# Patient Record
Sex: Male | Born: 1940 | Race: White | Hispanic: No | Marital: Married | Smoking: Never smoker
Health system: Southern US, Community
[De-identification: ages and names within clinical notes are randomized; demographics above are authoritative.]

## PROBLEM LIST (undated history)

## (undated) DIAGNOSIS — L739 Follicular disorder, unspecified: Secondary | ICD-10-CM

## (undated) DIAGNOSIS — K449 Diaphragmatic hernia without obstruction or gangrene: Secondary | ICD-10-CM

## (undated) DIAGNOSIS — L300 Nummular dermatitis: Secondary | ICD-10-CM

## (undated) DIAGNOSIS — M419 Scoliosis, unspecified: Secondary | ICD-10-CM

## (undated) DIAGNOSIS — E785 Hyperlipidemia, unspecified: Secondary | ICD-10-CM

## (undated) DIAGNOSIS — I10 Essential (primary) hypertension: Secondary | ICD-10-CM

## (undated) DIAGNOSIS — K259 Gastric ulcer, unspecified as acute or chronic, without hemorrhage or perforation: Secondary | ICD-10-CM

## (undated) DIAGNOSIS — I998 Other disorder of circulatory system: Secondary | ICD-10-CM

## (undated) DIAGNOSIS — C61 Malignant neoplasm of prostate: Secondary | ICD-10-CM

## (undated) DIAGNOSIS — R001 Bradycardia, unspecified: Secondary | ICD-10-CM

## (undated) DIAGNOSIS — N289 Disorder of kidney and ureter, unspecified: Secondary | ICD-10-CM

## (undated) DIAGNOSIS — K219 Gastro-esophageal reflux disease without esophagitis: Secondary | ICD-10-CM

## (undated) DIAGNOSIS — I509 Heart failure, unspecified: Secondary | ICD-10-CM

## (undated) DIAGNOSIS — I420 Dilated cardiomyopathy: Secondary | ICD-10-CM

## (undated) DIAGNOSIS — I2510 Atherosclerotic heart disease of native coronary artery without angina pectoris: Secondary | ICD-10-CM

## (undated) HISTORY — DX: Dilated cardiomyopathy: I42.0

## (undated) HISTORY — DX: Malignant neoplasm of prostate: C61

## (undated) HISTORY — DX: Gastric ulcer, unspecified as acute or chronic, without hemorrhage or perforation: K25.9

## (undated) HISTORY — PX: INGUINAL HERNIA REPAIR: SUR1180

---

## 2014-04-23 DIAGNOSIS — M12572 Traumatic arthropathy, left ankle and foot: Secondary | ICD-10-CM | POA: Diagnosis not present

## 2014-04-23 DIAGNOSIS — M25572 Pain in left ankle and joints of left foot: Secondary | ICD-10-CM | POA: Diagnosis not present

## 2015-07-05 DIAGNOSIS — H25811 Combined forms of age-related cataract, right eye: Secondary | ICD-10-CM | POA: Diagnosis not present

## 2015-07-05 DIAGNOSIS — H40013 Open angle with borderline findings, low risk, bilateral: Secondary | ICD-10-CM | POA: Diagnosis not present

## 2015-07-05 DIAGNOSIS — H25812 Combined forms of age-related cataract, left eye: Secondary | ICD-10-CM | POA: Diagnosis not present

## 2015-07-13 DIAGNOSIS — H2512 Age-related nuclear cataract, left eye: Secondary | ICD-10-CM | POA: Diagnosis not present

## 2015-07-29 DIAGNOSIS — H2511 Age-related nuclear cataract, right eye: Secondary | ICD-10-CM | POA: Diagnosis not present

## 2015-08-03 DIAGNOSIS — H2511 Age-related nuclear cataract, right eye: Secondary | ICD-10-CM | POA: Diagnosis not present

## 2015-12-19 ENCOUNTER — Ambulatory Visit (INDEPENDENT_AMBULATORY_CARE_PROVIDER_SITE_OTHER): Payer: Medicare Other | Admitting: Family Medicine

## 2015-12-19 ENCOUNTER — Other Ambulatory Visit: Payer: Self-pay

## 2015-12-19 ENCOUNTER — Emergency Department (HOSPITAL_COMMUNITY): Payer: Medicare Other

## 2015-12-19 ENCOUNTER — Inpatient Hospital Stay (HOSPITAL_COMMUNITY)
Admission: EM | Admit: 2015-12-19 | Discharge: 2015-12-22 | DRG: 308 | Disposition: A | Discharge status: Home / Self Care | Payer: Medicare Other | Attending: Internal Medicine | Admitting: Internal Medicine

## 2015-12-19 ENCOUNTER — Encounter: Payer: Self-pay | Admitting: Family Medicine

## 2015-12-19 ENCOUNTER — Encounter (HOSPITAL_COMMUNITY): Payer: Self-pay | Admitting: Physician Assistant

## 2015-12-19 VITALS — BP 144/80 | HR 144 | Temp 97.4°F | Resp 20

## 2015-12-19 DIAGNOSIS — E784 Other hyperlipidemia: Secondary | ICD-10-CM | POA: Diagnosis not present

## 2015-12-19 DIAGNOSIS — I5022 Chronic systolic (congestive) heart failure: Secondary | ICD-10-CM

## 2015-12-19 DIAGNOSIS — I10 Essential (primary) hypertension: Secondary | ICD-10-CM | POA: Diagnosis present

## 2015-12-19 DIAGNOSIS — M419 Scoliosis, unspecified: Secondary | ICD-10-CM | POA: Diagnosis present

## 2015-12-19 DIAGNOSIS — I4891 Unspecified atrial fibrillation: Secondary | ICD-10-CM | POA: Diagnosis not present

## 2015-12-19 DIAGNOSIS — R748 Abnormal levels of other serum enzymes: Secondary | ICD-10-CM

## 2015-12-19 DIAGNOSIS — I420 Dilated cardiomyopathy: Secondary | ICD-10-CM | POA: Diagnosis not present

## 2015-12-19 DIAGNOSIS — I272 Pulmonary hypertension, unspecified: Secondary | ICD-10-CM

## 2015-12-19 DIAGNOSIS — I447 Left bundle-branch block, unspecified: Secondary | ICD-10-CM | POA: Diagnosis present

## 2015-12-19 DIAGNOSIS — E785 Hyperlipidemia, unspecified: Secondary | ICD-10-CM | POA: Diagnosis present

## 2015-12-19 DIAGNOSIS — K219 Gastro-esophageal reflux disease without esophagitis: Secondary | ICD-10-CM | POA: Diagnosis present

## 2015-12-19 DIAGNOSIS — Z7982 Long term (current) use of aspirin: Secondary | ICD-10-CM

## 2015-12-19 DIAGNOSIS — E7849 Other hyperlipidemia: Secondary | ICD-10-CM | POA: Insufficient documentation

## 2015-12-19 DIAGNOSIS — I509 Heart failure, unspecified: Secondary | ICD-10-CM

## 2015-12-19 DIAGNOSIS — F0280 Dementia in other diseases classified elsewhere without behavioral disturbance: Secondary | ICD-10-CM | POA: Diagnosis present

## 2015-12-19 DIAGNOSIS — R739 Hyperglycemia, unspecified: Secondary | ICD-10-CM | POA: Diagnosis present

## 2015-12-19 DIAGNOSIS — G309 Alzheimer's disease, unspecified: Secondary | ICD-10-CM | POA: Diagnosis present

## 2015-12-19 DIAGNOSIS — R1310 Dysphagia, unspecified: Secondary | ICD-10-CM | POA: Diagnosis present

## 2015-12-19 DIAGNOSIS — I5021 Acute systolic (congestive) heart failure: Secondary | ICD-10-CM | POA: Diagnosis not present

## 2015-12-19 DIAGNOSIS — I480 Paroxysmal atrial fibrillation: Secondary | ICD-10-CM | POA: Diagnosis present

## 2015-12-19 DIAGNOSIS — I5043 Acute on chronic combined systolic (congestive) and diastolic (congestive) heart failure: Secondary | ICD-10-CM | POA: Diagnosis present

## 2015-12-19 DIAGNOSIS — Z79899 Other long term (current) drug therapy: Secondary | ICD-10-CM | POA: Diagnosis not present

## 2015-12-19 DIAGNOSIS — R7989 Other specified abnormal findings of blood chemistry: Secondary | ICD-10-CM

## 2015-12-19 DIAGNOSIS — E876 Hypokalemia: Secondary | ICD-10-CM | POA: Diagnosis present

## 2015-12-19 DIAGNOSIS — R0602 Shortness of breath: Secondary | ICD-10-CM | POA: Diagnosis not present

## 2015-12-19 DIAGNOSIS — R001 Bradycardia, unspecified: Secondary | ICD-10-CM | POA: Diagnosis present

## 2015-12-19 DIAGNOSIS — I110 Hypertensive heart disease with heart failure: Secondary | ICD-10-CM | POA: Diagnosis present

## 2015-12-19 DIAGNOSIS — Z8711 Personal history of peptic ulcer disease: Secondary | ICD-10-CM | POA: Diagnosis not present

## 2015-12-19 DIAGNOSIS — R778 Other specified abnormalities of plasma proteins: Secondary | ICD-10-CM | POA: Insufficient documentation

## 2015-12-19 DIAGNOSIS — I200 Unstable angina: Secondary | ICD-10-CM | POA: Diagnosis not present

## 2015-12-19 DIAGNOSIS — Z8249 Family history of ischemic heart disease and other diseases of the circulatory system: Secondary | ICD-10-CM

## 2015-12-19 DIAGNOSIS — I5031 Acute diastolic (congestive) heart failure: Secondary | ICD-10-CM

## 2015-12-19 HISTORY — DX: Follicular disorder, unspecified: L73.9

## 2015-12-19 HISTORY — DX: Hyperlipidemia, unspecified: E78.5

## 2015-12-19 HISTORY — DX: Nummular dermatitis: L30.0

## 2015-12-19 HISTORY — DX: Essential (primary) hypertension: I10

## 2015-12-19 HISTORY — DX: Gastro-esophageal reflux disease without esophagitis: K21.9

## 2015-12-19 HISTORY — DX: Gastric ulcer, unspecified as acute or chronic, without hemorrhage or perforation: K25.9

## 2015-12-19 HISTORY — DX: Scoliosis, unspecified: M41.9

## 2015-12-19 HISTORY — DX: Other disorder of circulatory system: I99.8

## 2015-12-19 HISTORY — DX: Bradycardia, unspecified: R00.1

## 2015-12-19 HISTORY — DX: Diaphragmatic hernia without obstruction or gangrene: K44.9

## 2015-12-19 LAB — BASIC METABOLIC PANEL
Anion gap: 10 (ref 5–15)
BUN: 21 mg/dL — ABNORMAL HIGH (ref 6–20)
CO2: 23 mmol/L (ref 22–32)
Calcium: 9.7 mg/dL (ref 8.9–10.3)
Chloride: 110 mmol/L (ref 101–111)
Creatinine, Ser: 1.07 mg/dL (ref 0.61–1.24)
GFR calc Af Amer: >60 mL/min (ref >60)
GFR calc non Af Amer: >60 mL/min (ref >60)
Glucose, Bld: 110 mg/dL — ABNORMAL HIGH (ref 65–99)
Potassium: 4.4 mmol/L (ref 3.5–5.1)
Sodium: 143 mmol/L (ref 135–145)

## 2015-12-19 LAB — TROPONIN I
TROPONIN I: 0.03 ng/mL — AB (ref <0.03)
TROPONIN I: 0.04 ng/mL — AB (ref <0.03)

## 2015-12-19 LAB — BRAIN NATRIURETIC PEPTIDE: B Natriuretic Peptide: 362.3 pg/mL — ABNORMAL HIGH (ref 0.0–100.0)

## 2015-12-19 LAB — CBC
HCT: 43.9 % (ref 39.0–52.0)
Hemoglobin: 14.6 g/dL (ref 13.0–17.0)
MCH: 30.1 pg (ref 26.0–34.0)
MCHC: 33.3 g/dL (ref 30.0–36.0)
MCV: 90.5 fL (ref 78.0–100.0)
Platelets: 234 10*3/uL (ref 150–400)
RBC: 4.85 MIL/uL (ref 4.22–5.81)
RDW: 14.2 % (ref 11.5–15.5)
WBC: 7.5 10*3/uL (ref 4.0–10.5)

## 2015-12-19 LAB — HEPATIC FUNCTION PANEL
ALBUMIN: 3.8 g/dL (ref 3.5–5.0)
ALT: 55 U/L (ref 17–63)
AST: 35 U/L (ref 15–41)
Alkaline Phosphatase: 107 U/L (ref 38–126)
Bilirubin, Direct: 0.4 mg/dL (ref 0.1–0.5)
Indirect Bilirubin: 1.6 mg/dL — ABNORMAL HIGH (ref 0.3–0.9)
TOTAL PROTEIN: 6.4 g/dL — AB (ref 6.5–8.1)
Total Bilirubin: 2 mg/dL — ABNORMAL HIGH (ref 0.3–1.2)

## 2015-12-19 LAB — I-STAT TROPONIN, ED: Troponin i, poc: 0.04 ng/mL (ref 0.00–0.08)

## 2015-12-19 LAB — MRSA PCR SCREENING: MRSA BY PCR: NEGATIVE

## 2015-12-19 LAB — TSH: TSH: 0.885 u[IU]/mL (ref 0.350–4.500)

## 2015-12-19 MED ORDER — ONDANSETRON HCL 4 MG PO TABS: 4.0000 mg | ORAL_TABLET | Freq: Four times a day (QID) | ORAL | Status: DC | PRN

## 2015-12-19 MED ORDER — FUROSEMIDE 10 MG/ML IJ SOLN
40.0000 mg | Freq: Once | INTRAMUSCULAR | Status: AC
  Administered 2015-12-19: 40 mg via INTRAVENOUS
  Filled 2015-12-19: qty 4

## 2015-12-19 MED ORDER — HEPARIN BOLUS VIA INFUSION
4000.0000 [IU] | Freq: Once | INTRAVENOUS | Status: AC
  Administered 2015-12-19: 4000 [IU] via INTRAVENOUS
  Filled 2015-12-19: qty 4000

## 2015-12-19 MED ORDER — ACETAMINOPHEN 650 MG RE SUPP: 650.0000 mg | Freq: Four times a day (QID) | RECTAL | Status: DC | PRN

## 2015-12-19 MED ORDER — DILTIAZEM HCL-DEXTROSE 100-5 MG/100ML-% IV SOLN (PREMIX)
5.0000 mg/h | INTRAVENOUS | Status: AC
  Administered 2015-12-19 – 2015-12-20 (×2): 10 mg/h via INTRAVENOUS
  Filled 2015-12-19: qty 100

## 2015-12-19 MED ORDER — POLYETHYLENE GLYCOL 3350 17 G PO PACK: 17.0000 g | PACK | Freq: Every day | ORAL | Status: DC | PRN

## 2015-12-19 MED ORDER — SENNOSIDES-DOCUSATE SODIUM 8.6-50 MG PO TABS
2.0000 | ORAL_TABLET | Freq: Every evening | ORAL | Status: DC | PRN
  Administered 2015-12-19: 2 via ORAL
  Filled 2015-12-19: qty 2

## 2015-12-19 MED ORDER — HEPARIN (PORCINE) IN NACL 100-0.45 UNIT/ML-% IJ SOLN
1200.0000 [IU]/h | INTRAMUSCULAR | Status: AC
  Administered 2015-12-19 – 2015-12-20 (×2): 1200 [IU]/h via INTRAVENOUS
  Filled 2015-12-19 (×2): qty 250

## 2015-12-19 MED ORDER — DILTIAZEM LOAD VIA INFUSION
20.0000 mg | Freq: Once | INTRAVENOUS | Status: AC
  Administered 2015-12-19: 20 mg via INTRAVENOUS
  Filled 2015-12-19: qty 20

## 2015-12-19 MED ORDER — ACETAMINOPHEN 325 MG PO TABS
650.0000 mg | ORAL_TABLET | Freq: Four times a day (QID) | ORAL | Status: DC | PRN
Start: 2015-12-19 — End: 2015-12-22

## 2015-12-19 MED ORDER — ONDANSETRON HCL 4 MG/2ML IJ SOLN: 4.0000 mg | Freq: Four times a day (QID) | INTRAMUSCULAR | Status: DC | PRN

## 2015-12-19 MED ORDER — ASPIRIN 81 MG PO CHEW
81.0000 mg | CHEWABLE_TABLET | Freq: Every day | ORAL | Status: DC
Start: 2015-12-19 — End: 2015-12-22
  Administered 2015-12-19 – 2015-12-22 (×4): 81 mg via ORAL
  Filled 2015-12-19 (×4): qty 1

## 2015-12-19 MED ORDER — DILTIAZEM HCL-DEXTROSE 100-5 MG/100ML-% IV SOLN (PREMIX)
5.0000 mg/h | INTRAVENOUS | Status: DC
  Administered 2015-12-19: 5 mg/h via INTRAVENOUS
  Filled 2015-12-19 (×2): qty 100

## 2015-12-19 MED ORDER — ASPIRIN 81 MG PO CHEW
324.0000 mg | CHEWABLE_TABLET | Freq: Once | ORAL | Status: AC
  Administered 2015-12-19: 324 mg via ORAL

## 2015-12-19 NOTE — H&P (Signed)
History and Physical    Stephen Copeland OEV:[id number] DOB: 07/19/1940 DOA: 12/19/2015  PCP: No primary care provider on file. [place] Patient coming from: home  Chief Complaint: sob  HPI: Stephen Copeland is a 75 y.o. male with medical history significant for duodenal ulcer, hypertension, hyperlipidemia, GERD presents to the emergency Department chief complaint gradual worsening shortness of breath. Initial evaluation reveals A. fib with RVR and hypertension.  Information is obtained from the patient. He reports a persistent shortness of breath since 1990. He said it began to get worse about a month ago and in the last 3-4 days he developed a cough generalized weakness. He reports one episode of  chest pain several days ago but states it did not last even a minute. Describes pain as sudden ache left anterior non-radiating. No diaphoresis.  He states he was regurgitating his medication so about a week ago he had a barium swallow. Since that time he just "hasn't felt right". He says he's been having difficulty swallowing and experiences intermittent nausea. He denies headache visual disturbances syncope or near-syncope. He denies abdominal pain. He reports some LE edema and orthopnea.  He also reports history of sinus bradycardia and anterior septals ischemia. He states he's had several stress test in the past. He went to [facility] today where rapid AF 140's noted. [facility]    ED Course: Emergency department is afebrile hypertensive A. fib with RVR not hypoxic  Review of Systems: As per HPI otherwise 10 point review of systems negative.   Ambulatory Status: Ambulates independently with a steady gait no recent falls  Past Medical History:  Diagnosis Date  . Acid reflux   . Folliculitis   . Hiatal hernia   . Hyperlipidemia    a. pt is adamantly against statins.  . Hypertension   . Ischemia    a. Pt states he was diagnosed with "ischemia" in the 1990s but does not know further  details, denies hx of heart blockage.  . Nummular dermatitis   . Scoliosis   . Sinus bradycardia   . Stomach ulcer    a. remote ulcer in the 1990s, no hx of bleeding.    No past surgical history on file.  Social History   Social History  . Marital status: Married    Spouse name: N/A  . Number of children: N/A  . Years of education: N/A   Occupational History  . Not on file.   Social History Main Topics  . Smoking status: Never Smoker  . Smokeless tobacco: Not on file  . Alcohol use No  . Drug use: No  . Sexual activity: Not on file   Other Topics Concern  . Not on file   Social History Narrative  . No narrative on file    No Known Allergies  Family History  Problem Relation Age of Onset  . Heart disease Mother     Further details not reported, died at age 46  . Valvular heart disease Father     H/o MV surgery, diet at age 48    Prior to Admission medications   Medication Sig Start Date End Date Taking? Authorizing Provider  aspirin 81 MG chewable tablet Chew 81 mg by mouth daily.   Yes Historical Provider, MD  ENSURE PLUS (ENSURE PLUS) LIQD Take 237 mLs by mouth daily.   Yes Historical Provider, MD  guaiFENesin-dextromethorphan (ROBITUSSIN DM) 100-10 MG/5ML syrup Take 5 mLs by mouth every 4 (four) hours as needed for  cough.   Yes Historical Provider, MD  verapamil (CALAN-SR) 180 MG CR tablet Take 180 mg by mouth 2 (two) times daily.   Yes Historical Provider, MD    Physical Exam: Vitals:   12/19/15 1433 12/19/15 1445 12/19/15 1500 12/19/15 1533  BP: (!) 146/112 151/94 137/96 (!) 155/114  Pulse: 101 89 76 103  Resp: 19 20  23   Temp:      TempSrc:      SpO2: 93% 93% 92% 94%     General:  Appears calm and comfortable No acute distress Eyes:  PERRL, EOMI, normal lids, iris ENT:  grossly normal hearing, lips & tongue, mmm Neck:  no LAD, masses or thyromegaly Cardiovascular:  Irregularly irregular no m/r/g. 1+ pitting edema on right leg.  Respiratory:   Rest sounds somewhat distant hear fine crackles faint rhonchi, no w/r/r. Normal respiratory effort. Abdomen:  soft, ntnd, NABS Skin:  no rash or induration seen on limited exam Musculoskeletal:  grossly normal tone BUE/BLE, good ROM, no bony abnormality Psychiatric:  grossly normal mood and affect, speech fluent and appropriate, AOx3 Neurologic:  CN 2-12 grossly intact, moves all extremities in coordinated fashion, sensation intact  Labs on Admission: I have personally reviewed following labs and imaging studies  CBC:  Recent Labs Lab 12/19/15 1144  WBC 7.5  HGB 14.6  HCT 43.9  MCV 90.5  PLT 323   Basic Metabolic Panel:  Recent Labs Lab 12/19/15 1144  NA 143  K 4.4  CL 110  CO2 23  GLUCOSE 110*  BUN 21*  CREATININE 1.07  CALCIUM 9.7   GFR: CrCl cannot be calculated (Unknown ideal weight.). Liver Function Tests: No results for input(s): AST, ALT, ALKPHOS, BILITOT, PROT, ALBUMIN in the last 168 hours. No results for input(s): LIPASE, AMYLASE in the last 168 hours. No results for input(s): AMMONIA in the last 168 hours. Coagulation Profile: No results for input(s): INR, PROTIME in the last 168 hours. Cardiac Enzymes: No results for input(s): CKTOTAL, CKMB, CKMBINDEX, TROPONINI in the last 168 hours. BNP (last 3 results) No results for input(s): PROBNP in the last 8760 hours. HbA1C: No results for input(s): HGBA1C in the last 72 hours. CBG: No results for input(s): GLUCAP in the last 168 hours. Lipid Profile: No results for input(s): CHOL, HDL, LDLCALC, TRIG, CHOLHDL, LDLDIRECT in the last 72 hours. Thyroid Function Tests: No results for input(s): TSH, T4TOTAL, FREET4, T3FREE, THYROIDAB in the last 72 hours. Anemia Panel: No results for input(s): VITAMINB12, FOLATE, FERRITIN, TIBC, IRON, RETICCTPCT in the last 72 hours. Urine analysis: No results found for: COLORURINE, APPEARANCEUR, LABSPEC, PHURINE, GLUCOSEU, HGBUR, BILIRUBINUR, KETONESUR, PROTEINUR,  UROBILINOGEN, NITRITE, LEUKOCYTESUR  Creatinine Clearance: CrCl cannot be calculated (Unknown ideal weight.).  Sepsis Labs: @LABRCNTIP (procalcitonin:4,lacticidven:4) )No results found for this or any previous visit (from the past 240 hour(s)).   Radiological Exams on Admission: Dg Chest 2 View  Result Date: 12/19/2015 CLINICAL DATA:  Patient with chronic shortness of breath. EXAM: CHEST  2 VIEW COMPARISON:  None. FINDINGS: Cardiac contours are enlarged. Tortuosity of the thoracic aorta. Bilateral mid and lower lung heterogeneous pulmonary opacities. Pulmonary vascular redistribution. Perihilar interstitial opacities. Small bilateral pleural effusions. Thoracic spine degenerative changes. IMPRESSION: Cardiomegaly, pulmonary vascular redistribution and mild bilateral perihilar interstitial opacities favored to represent mild edema. Basilar heterogeneous opacities may represent atelectasis, scarring or infection. Electronically Signed   By: Lovey Newcomer M.D.   On: 12/19/2015 12:37    EKG: Independently reviewed. Atrial fibrillation Ventricular premature complex Left bundle branch block  No previous tracing  Assessment/Plan Principal Problem:   New onset a-fib [facility]) Active Problems:   Atrial fibrillation (HCC)   Hypertension   Acid reflux   Hyperlipidemia   #1. New onset A. Fib. Chadvasc score 3. History of hypertension ischemia. EKG as noted above. Patient provided with diltiazem drip in the emergency department and rate became controlled. Evaluated by cardiology who recommended anticoagulation -Admit to telemetry -heparin per pharmacy per cards -Continue diltiazem drip -Wean diltiazem drip as able -Obtain a TSH -Obtain an echo per cardiology recommendation -Appreciate cardiology assistance  #2. Hypertension. Fair control in the emergency department. -Currently on a diltiazem drip -Track blood pressure as drip weaned  #3. Hyperlipidemia. No chest pain.. Not currently on a  statin -lipid panel  4. Lower extremity edema. He reports history of ischemia but denies any heart failure. Chest xray as noted above. BNP 363, Evaluated by cardiology who opined mild acute CHF. -Echo -Cycle troponin -IV Lasix -Daily weights -Monitor intake and output     DVT prophylaxis: hepain  Code Status: full  Family Communication: none  Disposition Plan: home when ready  Consults called: Sharrell Ku PA cardiology  Admission status: inpatient    Radene Gunning MD [facility]  If 7PM-7AM, please contact night-coverage [email or web address] Password [email or web address]  12/19/2015, 3:42 PM

## 2015-12-19 NOTE — Consult Note (Signed)
Cardiology Consultation Note    Patient ID: Stephen Copeland, MRN: [id number], DOB/AGE: Jul 11, 1940 75 y.o. Admit date: 12/19/2015   Date of Consult: 12/19/2015 Primary Physician: No primary care provider on file. Primary Cardiologist: New to Dr. [name] - receives care at [facility]  Chief Complaint: Shortness of breath, orthopnea Reason for Consultation: Newly recognized atrial fib RVR Requesting MD: Dr. Marily Memos  HPI: Stephen Copeland is a 75 y.o. male with history of HTN, hyperlipidemia (adamant against taking statin), nummular dermatitis, sinus bradycardia, remote stomach ulcer without h/o significant bleeding, hiatal hernia, GERD, chronic edema who presented to [facility] upon transfer from urgent care with newly recognized atrial fib. He gets all his care through the [place]. He reports bring told he had "ischemia" and "sinus bradycardia" in the past, but does not know further details of how the "ischemia" was diagnosed - this was in the 1990s and he says he's subsequently had stress tests that were OK. Denies h/o CAD, PCI or cardiac surgery. For the past 3-4 days he's had worsening SOB, DOE, and orthopnea along with presyncope. He "just hasn't felt right." He's had SOB for quite some time but it's been really pronounced over the last few days. On one occasion he felt chest discomfort described as a ring around the heart that came and went in a few seconds, not precipitated by anything. He also also felt his heart pounding and body shaking. He had to prop up on pillows/recliner last night to sleep. He reports chronic BLE, unchanged recently. He went to urgent care today where he was noted to be in rapid AF 140s with LBBB of unknown duration. He was transported to [facility] and received diltiazem 20mg  IV with drip and subsequent improvement in HR to the 90s. POC trop neg, BNP 363, BUN 21, Cr 1.07, K 4.4, Hgb 14.6, glucose 110. CXR shows cardiomegaly, pulmonary vascular redistribution and mild bilateral perihilar interstitial  opacities favored to represent mild edema with basilar heterogeneous opacities possibly to represent atelectasis, scarring or infection. He is hypertensive up to 172/132 in the ED. He likes to learn about medical conditions including the dangers of statins and said part of how he learns is going to [facility] and asking the surgeons that come in there questions.   Past Medical History:  Diagnosis Date  . Acid reflux   . Folliculitis   . Hiatal hernia   . Hyperlipidemia    a. pt is adamantly against statins.  . Hypertension   . Ischemia    a. Pt states he was diagnosed with "ischemia" in the 1990s but does not know further details, denies hx of heart blockage.  . Nummular dermatitis   . Scoliosis   . Sinus bradycardia   . Stomach ulcer    a. remote ulcer in the 1990s, no hx of bleeding.      Surgical History: No past surgical history on file.   Home Meds: Prior to Admission medications   Medication Sig Start Date End Date Taking? Authorizing Provider  aspirin 81 MG chewable tablet Chew 81 mg by mouth daily.   Yes Historical Provider, MD  ENSURE PLUS (ENSURE PLUS) LIQD Take 237 mLs by mouth daily.   Yes Historical Provider, MD  guaiFENesin-dextromethorphan (ROBITUSSIN DM) 100-10 MG/5ML syrup Take 5 mLs by mouth every 4 (four) hours as needed for cough.   Yes Historical Provider, MD  verapamil (CALAN-SR) 180 MG CR tablet Take 180 mg by mouth 2 (two) times daily.   Yes Historical Provider, MD  Inpatient Medications:  . aspirin  81 mg Oral Daily   . diltiazem (CARDIZEM) infusion 7.5 mg/hr (12/19/15 1416)  . diltiazem (CARDIZEM) infusion Stopped (12/19/15 1505)    Allergies: No Known Allergies  Social History   Social History  . Marital status: Married    Spouse name: N/A  . Number of children: N/A  . Years of education: N/A   Occupational History  . Not on file.   Social History Main Topics  . Smoking status: Never Smoker  . Smokeless tobacco: Not on file  .  Alcohol use No  . Drug use: No  . Sexual activity: Not on file   Other Topics Concern  . Not on file   Social History Narrative  . No narrative on file     Family History  Problem Relation Age of Onset  . Heart disease Mother     Further details not reported, died at age 46  . Valvular heart disease Father     H/o MV surgery, diet at age 50     Review of Systems: occ nausea. All other systems reviewed and are otherwise negative except as noted above.  Labs: No results for input(s): CKTOTAL, CKMB, TROPONINI in the last 72 hours. Lab Results  Component Value Date   WBC 7.5 12/19/2015   HGB 14.6 12/19/2015   HCT 43.9 12/19/2015   MCV 90.5 12/19/2015   PLT 234 12/19/2015    Recent Labs Lab 12/19/15 1144  NA 143  K 4.4  CL 110  CO2 23  BUN 21*  CREATININE 1.07  CALCIUM 9.7  GLUCOSE 110*   No results found for: CHOL, HDL, LDLCALC, TRIG No results found for: DDIMER  Radiology/Studies:  Dg Chest 2 View  Result Date: 12/19/2015 CLINICAL DATA:  Patient with chronic shortness of breath. EXAM: CHEST  2 VIEW COMPARISON:  None. FINDINGS: Cardiac contours are enlarged. Tortuosity of the thoracic aorta. Bilateral mid and lower lung heterogeneous pulmonary opacities. Pulmonary vascular redistribution. Perihilar interstitial opacities. Small bilateral pleural effusions. Thoracic spine degenerative changes. IMPRESSION: Cardiomegaly, pulmonary vascular redistribution and mild bilateral perihilar interstitial opacities favored to represent mild edema. Basilar heterogeneous opacities may represent atelectasis, scarring or infection. Electronically Signed   By: Lovey Newcomer M.D.   On: 12/19/2015 12:37    Wt Readings from Last 3 Encounters:  No data found for Wt    EKG: atrial fib 133bpm, LBBB, occ PVCs, nonspecific ST-T changes without prior EKG to compare to  Physical Exam: Blood pressure (!) 155/114, pulse 103, temperature 97.7 F (36.5 C), temperature source Oral, resp. rate  23, SpO2 94 %. There is no height or weight on file to calculate BMI. General: Well developed, well nourished WM, in no acute distress. Head: Normocephalic, atraumatic, sclera non-icteric, no xanthomas, nares are without discharge. Neck: Negative for carotid bruits. JVD not elevated. Lungs: Basilar crackles and coarse rhonchi. No rales or rhonchi. Breathing is unlabored. Heart: Irregularly irregular, borderline tachycardic. No murmurs, rubs, or gallops appreciated. Abdomen: Soft, non-tender, non-distended with normoactive bowel sounds. No hepatomegaly. No rebound/guarding. No obvious abdominal masses. Msk:  Strength and tone appear normal for age. Extremities: No clubbing or cyanosis. Tr-1+ BLE pitting edema.  Distal pedal pulses are 2+ and equal bilaterally. Neuro: Alert and oriented X 3. No facial asymmetry. No focal deficit. Moves all extremities spontaneously. Psych:  Responds to questions appropriately with a normal affect.     Assessment and Plan  41M with HTN, hyperlipidemia (adamant against taking statin), nummular dermatitis, sinus  bradycardia, remote stomach ulcer without h/o significant bleeding, hiatal hernia, GERD, chronic edema who presented to [facility] with newly recognized atrial fib.  1. Newly recognized atrial fib - thyroid function pending. CHADSVASC is at least 2 for age and HTN (LVEF and A1C not yet known), warranting anticoagulation to reduce risk of stroke. Risks and benefits of blood thinners reviewed with patient. Will start heparin per pharmacy. If he rules out and no further invasive procedures planned, would transition to Eliquis tomorrow. Monitor rhythm on IV diltiazem. If he remains in afib, will need to reassess whether we need to pursue TEE/DCCV as inpatient versus DC home with 3 weeks of anticoagulation followed by cardioversion. Will order 2D echo to evaluate LV function.  2. Suspected mild acute CHF, unknown EF - check echocardiogram. Will also trend troponins. Will  give one dose of IV Lasix in ED and follow volume status.  3. HTN - follow BP as rate-controlling med is titrated.  4. Hyperlipidemia - f/u lipid panel in AM along with baseline LFTs.  5. Hyperglycemia - obtain A1C.  Signed, Stephen Pitter PA-C 12/19/2015, 3:37 PM Pager: [phone]  I have personally seen and examined this patient with Stephen Copa, PA-C. I agree with the assessment and plan as outlined above. He is being admitted with new onset atrial fib. He is followed at the [facility] and has not been told in the past that he had atrial fib. He has had several days of worsened dyspnea, orthopnea and LE edema. He was found to have atrial fib with RVR. My exam shows a well developed male in NAD. [id number], irregular. Lungs: rhonci bilaterally with basilar crackles, Ext: 1+ bilateral LE edema. Labs reviewed by me. EKG reviewed by me shows atrial fib with rate 130s bpm, LBBB, Non-specific ST changes.  Will admit to tele. IV heparin tonight. IV Cardizem for rate control. Echo tomorrow. Cycle troponin. Lasix for volume overload. If we can rate control on Cardizem, will change to oral Cardizem tomorrow. Will consider NOAC tomorrow if no invasive procedures are planned.   Stephen Copeland 12/19/2015 4:24 PM

## 2015-12-19 NOTE — ED Notes (Signed)
MD at bedside. 

## 2015-12-19 NOTE — ED Provider Notes (Signed)
[facility] DEPT Provider Note   CSN: [id number] Arrival date & time: 12/19/15  1109     History   Chief Complaint Chief Complaint  Patient presents with  . Atrial Fibrillation  . Shortness of Breath    HPI Stephen Copeland is a 75 y.o. male who presents with shortness of breath that began 1 month ago and has been worsening in the past 3-4 days. Patient states that his shortness of breath is worse on exertion. He denies any chest pain. Patient is on verapamil for hypertension, however he has not been taking in very much for the past month because he is having difficulty with regurgitation. Patient is seeing GI through the [place]. He recently had a barium swallow for workup of this issue. Patient has history of sinus bradycardia and anteroseptal ischemia in the early 90s, however no other cardiac history. Patient denies any abdominal pain, nausea, vomiting, urinary symptoms.  HPI  No past medical history on file.  Patient Active Problem List   Diagnosis Date Noted  . Atrial fibrillation ([place]) 12/19/2015  . Hypertension 12/19/2015  . New onset a-fib ([place]) 12/19/2015    No past surgical history on file.     Home Medications    Prior to Admission medications   Medication Sig Start Date End Date Taking? Authorizing Provider  aspirin 81 MG chewable tablet Chew 81 mg by mouth daily.   Yes Historical Provider, MD  ENSURE PLUS (ENSURE PLUS) LIQD Take 237 mLs by mouth daily.   Yes Historical Provider, MD  guaiFENesin-dextromethorphan (ROBITUSSIN DM) 100-10 MG/5ML syrup Take 5 mLs by mouth every 4 (four) hours as needed for cough.   Yes Historical Provider, MD  verapamil (CALAN-SR) 180 MG CR tablet Take 180 mg by mouth 2 (two) times daily.   Yes Historical Provider, MD    Family History No family history on file.  Social History Social History  Substance Use Topics  . Smoking status: Unknown If Ever Smoked  . Smokeless tobacco: Not on file  . Alcohol use Not on file      Allergies   Review of patient's allergies indicates no known allergies.   Review of Systems Review of Systems  Constitutional: Negative for chills and fever.  HENT: Negative for facial swelling and sore throat.   Respiratory: Positive for shortness of breath.   Cardiovascular: Positive for leg swelling. Negative for chest pain.  Gastrointestinal: Negative for abdominal pain, nausea and vomiting.  Genitourinary: Negative for dysuria.  Musculoskeletal: Negative for back pain.  Skin: Negative for rash and wound.  Neurological: Negative for headaches.  Psychiatric/Behavioral: The patient is not nervous/anxious.      Physical Exam Updated Vital Signs BP 137/96   Pulse 76   Temp 97.7 F (36.5 C) (Oral)   Resp 20   SpO2 92%   Physical Exam  Constitutional: He appears well-developed and well-nourished. No distress.  HENT:  Head: Normocephalic and atraumatic.  Mouth/Throat: Oropharynx is clear and moist. No oropharyngeal exudate.  Eyes: Conjunctivae are normal. Pupils are equal, round, and reactive to light. Right eye exhibits no discharge. Left eye exhibits no discharge. No scleral icterus.  Neck: Normal range of motion. Neck supple. No thyromegaly present.  Cardiovascular: Normal heart sounds and intact distal pulses.  An irregularly irregular rhythm present. Tachycardia present.  Exam reveals no gallop and no friction rub.   No murmur heard. Pulmonary/Chest: Effort normal and breath sounds normal. No stridor. No respiratory distress. He has no wheezes. He has no rales.  Abdominal: Soft. Bowel sounds are normal. He exhibits no distension. There is no tenderness. There is no rebound and no guarding.  Musculoskeletal: He exhibits no edema.  Lymphadenopathy:    He has no cervical adenopathy.  Neurological: He is alert. Coordination normal.  Skin: Skin is warm and dry. No rash noted. He is not diaphoretic. No pallor.  Psychiatric: He has a normal mood and affect.  Nursing  note and vitals reviewed.    ED Treatments / Results  Labs (all labs ordered are listed, but only abnormal results are displayed) Labs Reviewed  BASIC METABOLIC PANEL - Abnormal; Notable for the following:       Result Value   Glucose, Bld 110 (*)    BUN 21 (*)    All other components within normal limits  BRAIN NATRIURETIC PEPTIDE - Abnormal; Notable for the following:    B Natriuretic Peptide 362.3 (*)    All other components within normal limits  CBC  TSH  I-STAT TROPOININ, ED    EKG  EKG Interpretation  Date/Time:  Monday December 19 2015 11:04:56 EDT Ventricular Rate:  133 PR Interval:    QRS Duration: 139 QT Interval:  330 QTC Calculation: 491 R Axis:   -33 Text Interpretation:  Atrial fibrillation Ventricular premature complex Left bundle branch block No previous tracing Confirmed by Ashok Cordia  MD, Lennette Bihari ([id number]) on 12/19/2015 2:34:58 PM       Radiology Dg Chest 2 View  Result Date: 12/19/2015 CLINICAL DATA:  Patient with chronic shortness of breath. EXAM: CHEST  2 VIEW COMPARISON:  None. FINDINGS: Cardiac contours are enlarged. Tortuosity of the thoracic aorta. Bilateral mid and lower lung heterogeneous pulmonary opacities. Pulmonary vascular redistribution. Perihilar interstitial opacities. Small bilateral pleural effusions. Thoracic spine degenerative changes. IMPRESSION: Cardiomegaly, pulmonary vascular redistribution and mild bilateral perihilar interstitial opacities favored to represent mild edema. Basilar heterogeneous opacities may represent atelectasis, scarring or infection. Electronically Signed   By: Lovey Newcomer M.D.   On: 12/19/2015 12:37    Procedures Procedures (including critical care time)  Medications Ordered in ED Medications  diltiazem (CARDIZEM) 1 mg/mL load via infusion 20 mg (20 mg Intravenous Bolus from Bag 12/19/15 1243)    And  diltiazem (CARDIZEM) 100 mg in dextrose 5% 154mL (1 mg/mL) infusion (7.5 mg/hr Intravenous Rate/Dose Change  12/19/15 1416)  aspirin chewable tablet 81 mg (not administered)  diltiazem (CARDIZEM) 100 mg in dextrose 5% 139mL (1 mg/mL) infusion (0 mg/hr Intravenous Hold 12/19/15 1505)  acetaminophen (TYLENOL) tablet 650 mg (not administered)    Or  acetaminophen (TYLENOL) suppository 650 mg (not administered)  ondansetron (ZOFRAN) tablet 4 mg (not administered)    Or  ondansetron (ZOFRAN) injection 4 mg (not administered)     Initial Impression / Assessment and Plan / ED Course  I have reviewed the triage vital signs and the nursing notes.  Pertinent labs & imaging results that were available during my care of the patient were reviewed by me and considered in my medical decision making (see chart for details).  Clinical Course    CBC, BMP, troponin unremarkable. EKG shows atrial fibrillation, PVCs, bundle-branch block. CXR shows cardiomegaly, pulmonary vascular redistribution and mild bilateral perihilar interstitial opacities favored to represent mild edema; basilar heterogeneous opacities may represent atelectasis, scarring or infection. Diltiazem drip initiated in the ED with good rate control. I consulted cardiology who will evaluate the patient. I consulted medicine and spoke with Dyanne Carrel who will admit the patient to step down for further evaluation and  treatment. Patient also evaluated by Dr. Ashok Cordia who guided patient's management and agrees with plan.  Final Clinical Impressions(s) / ED Diagnoses   Final diagnoses:  Atrial fibrillation, new onset ([place])  Acute on chronic combined systolic and diastolic CHF (congestive heart failure) ([place])  Uncontrolled hypertension    New Prescriptions New Prescriptions   No medications on file     Frederica Kuster, PA-C 12/19/15 [place], MD 12/19/15 1538

## 2015-12-19 NOTE — Progress Notes (Signed)
   Patient ID: Stephen Copeland, male    DOB: 12-Sep-1940, 75 y.o.   MRN: [id number]  PCP: No primary care provider on file.  No chief complaint on file.   Subjective:   HPI 75 year old presents for evaluation of chest pain and shortness of breath times 3 days. He presents today feeling extremely fatigued and short of breath. He reports that he hasn't felt well since he had a barium swallow exam at the [facility] clinic. He reports nausea and vomiting since this procedure and hasn't been able to swallow medication without vomiting. He describes intermittent sharp pain up the middle of his chest, that resolved without intervention. Denies diaphoresis or history of angina or atrial fibrillation. Reports that he takes verapamil although he is uncertain of what he takes the medication for.  Review of Systems See HPI  There are no active problems to display for this patient.  Prior to Admission medications   Not on File   Allergies not on file     Objective:  Physical Exam  Constitutional: He is oriented to person, place, and time. He appears well-developed and well-nourished.  HENT:  Head: Normocephalic and atraumatic.  Right Ear: External ear normal.  Left Ear: External ear normal.  Nose: Nose normal.  Eyes: Conjunctivae are normal. Pupils are equal, round, and reactive to light.  Neck: Normal range of motion.  Cardiovascular: Normal rate.   Irregular heart rhythm and beat. Heart rate 144 BPM EKG revealed Atrial Fibrillation with RVR.  Pulmonary/Chest: He is in respiratory distress.  Dyspnea. Pulse oximetry 92%. Applied 2 liters of oxygen, increase pulse oximetry reading to 94%.    Musculoskeletal:  Unsteady gait.  Neurological: He is alert and oriented to person, place, and time.  Skin: Skin is dry.  [place]  Psychiatric: He has a normal mood and affect. His behavior is normal. Judgment and thought content normal.   Vitals:   12/19/15 1025  BP: (!) 144/80  Pulse: (!) 144  Resp:  20  Temp: 97.4 F (36.3 C)      Assessment & Plan:  1. Unstable angina pectoris (HCC) - EKG 12-Lead-Atrial Fibrillation with RVR - aspirin chewable tablet 324 mg; Chew 4 tablets (324 mg total) by mouth once. Patient is alert, oriented, and stable on 2 liters of oxygen. Continuous telemetry monitoring and rhythm strip confirms active Atrial Fibrillation. Plan: Emergent transport via EMS to [facility] Emergency Department. Spouse is at bedside and accompanies patient via EMS.  Carroll Sage. Kenton Kingfisher, MSN, FNP-C [facility]

## 2015-12-19 NOTE — Progress Notes (Signed)
ANTICOAGULATION CONSULT NOTE - Initial Consult  Pharmacy Consult for heparin Indication: atrial fibrillation  No Known Allergies  Patient Measurements: Height: 6\' 2"  (188 cm) Weight: 188 lb (85.3 kg) IBW/kg (Calculated) : 82.2 Heparin Dosing Weight: 85.5  Vital Signs: Temp: 97.7 F (36.5 C) (10/16 1108) Temp Source: Oral (10/16 1108) BP: 152/108 (10/16 1545) Pulse Rate: 97 (10/16 1600)  Labs:  Recent Labs  12/19/15 1144  HGB 14.6  HCT 43.9  PLT 234  CREATININE 1.07    Estimated Creatinine Clearance: 70.4 mL/min (by C-G formula based on SCr of 1.07 mg/dL). Normal clearance  Medical History: Past Medical History:  Diagnosis Date  . Acid reflux   . Folliculitis   . Hiatal hernia   . Hyperlipidemia    a. pt is adamantly against statins.  . Hypertension   . Ischemia    a. Pt states he was diagnosed with "ischemia" in the 1990s but does not know further details, denies hx of heart blockage.  . Nummular dermatitis   . Scoliosis   . Sinus bradycardia   . Stomach ulcer    a. remote ulcer in the 1990s, no hx of bleeding.    Medications:   (Not in a hospital admission)  Assessment: Pt has no PTA anticoagulants. H/H and PLT WNL. Using heparin for new onset afib from this admit.   Goal of Therapy:  Heparin level 0.3-0.7 units/ml Monitor platelets by anticoagulation protocol: Yes   Plan:  Heparin 4000 units IV bolus x1 then 1200 units/hr Heparin level at 0100 Daily CBC and heparin level  Cheral Almas, PharmD Candidate 12/19/2015,4:18 PM

## 2015-12-19 NOTE — ED Notes (Signed)
Patient transported to X-ray 

## 2015-12-19 NOTE — Progress Notes (Signed)
Pt requesting some lubricant for him to digitally stimulate a bowel movement. Pt informs this RN he does this every day sometimes multiple times a day. RN informs pt a doctors order will have to be provided for any medication to be given. Will page MD to make aware and continue to monitor pt.

## 2015-12-19 NOTE — ED Triage Notes (Signed)
Pt here from an UC with c/o sob and afib , no history of afib , pt rate is 115 to 160 , sob with movement , no chest pain , asa 324 mg given at [facility]

## 2015-12-20 ENCOUNTER — Encounter (HOSPITAL_COMMUNITY): Payer: Self-pay | Admitting: *Deleted

## 2015-12-20 ENCOUNTER — Other Ambulatory Visit (HOSPITAL_COMMUNITY): Payer: Medicare Other

## 2015-12-20 ENCOUNTER — Inpatient Hospital Stay (HOSPITAL_COMMUNITY): Payer: Medicare Other

## 2015-12-20 DIAGNOSIS — I4891 Unspecified atrial fibrillation: Secondary | ICD-10-CM

## 2015-12-20 DIAGNOSIS — I10 Essential (primary) hypertension: Secondary | ICD-10-CM

## 2015-12-20 DIAGNOSIS — I5022 Chronic systolic (congestive) heart failure: Secondary | ICD-10-CM

## 2015-12-20 DIAGNOSIS — I272 Pulmonary hypertension, unspecified: Secondary | ICD-10-CM

## 2015-12-20 LAB — CBC
HEMATOCRIT: 42.9 % (ref 39.0–52.0)
HEMOGLOBIN: 14.8 g/dL (ref 13.0–17.0)
MCH: 31.2 pg (ref 26.0–34.0)
MCHC: 34.5 g/dL (ref 30.0–36.0)
MCV: 90.5 fL (ref 78.0–100.0)
Platelets: 209 10*3/uL (ref 150–400)
RBC: 4.74 MIL/uL (ref 4.22–5.81)
RDW: 14 % (ref 11.5–15.5)
WBC: 8.8 10*3/uL (ref 4.0–10.5)

## 2015-12-20 LAB — HEPARIN LEVEL (UNFRACTIONATED): Heparin Unfractionated: 0.38 IU/mL (ref 0.30–0.70)

## 2015-12-20 LAB — MAGNESIUM: Magnesium: 1.8 mg/dL (ref 1.7–2.4)

## 2015-12-20 LAB — LIPID PANEL
CHOL/HDL RATIO: 4.8 ratio
Cholesterol: 130 mg/dL (ref 0–200)
HDL: 27 mg/dL — AB (ref >40)
LDL CALC: 93 mg/dL (ref 0–99)
Triglycerides: 51 mg/dL (ref <150)
VLDL: 10 mg/dL (ref 0–40)

## 2015-12-20 LAB — HEMOGLOBIN A1C
Hgb A1c MFr Bld: 5.9 % — ABNORMAL HIGH (ref 4.8–5.6)
MEAN PLASMA GLUCOSE: 123 mg/dL

## 2015-12-20 LAB — BASIC METABOLIC PANEL
ANION GAP: 9 (ref 5–15)
BUN: 17 mg/dL (ref 6–20)
CHLORIDE: 108 mmol/L (ref 101–111)
CO2: 25 mmol/L (ref 22–32)
CREATININE: 1.07 mg/dL (ref 0.61–1.24)
Calcium: 9.1 mg/dL (ref 8.9–10.3)
GFR calc non Af Amer: >60 mL/min (ref >60)
Glucose, Bld: 110 mg/dL — ABNORMAL HIGH (ref 65–99)
POTASSIUM: 3.4 mmol/L — AB (ref 3.5–5.1)
Sodium: 142 mmol/L (ref 135–145)

## 2015-12-20 LAB — TROPONIN I: Troponin I: 0.03 ng/mL (ref <0.03)

## 2015-12-20 LAB — ECHOCARDIOGRAM COMPLETE
HEIGHTINCHES: 72 in
WEIGHTICAEL: 2906.54 [oz_av]

## 2015-12-20 MED ORDER — FUROSEMIDE 20 MG PO TABS
20.0000 mg | ORAL_TABLET | Freq: Every day | ORAL | Status: DC
  Administered 2015-12-21 – 2015-12-22 (×2): 20 mg via ORAL
  Filled 2015-12-20 (×2): qty 1

## 2015-12-20 MED ORDER — POTASSIUM CHLORIDE CRYS ER 20 MEQ PO TBCR
40.0000 meq | EXTENDED_RELEASE_TABLET | Freq: Two times a day (BID) | ORAL | Status: DC
  Administered 2015-12-20 (×2): 40 meq via ORAL
  Filled 2015-12-20 (×2): qty 2

## 2015-12-20 MED ORDER — ATORVASTATIN CALCIUM 20 MG PO TABS
20.0000 mg | ORAL_TABLET | Freq: Every day | ORAL | Status: DC
  Administered 2015-12-20 – 2015-12-21 (×2): 20 mg via ORAL
  Filled 2015-12-20: qty 1

## 2015-12-20 MED ORDER — DILTIAZEM HCL ER COATED BEADS 240 MG PO CP24
240.0000 mg | ORAL_CAPSULE | Freq: Every day | ORAL | Status: DC
  Administered 2015-12-20 – 2015-12-21 (×2): 240 mg via ORAL
  Filled 2015-12-20 (×2): qty 1

## 2015-12-20 MED ORDER — APIXABAN 5 MG PO TABS
5.0000 mg | ORAL_TABLET | Freq: Two times a day (BID) | ORAL | Status: DC
  Administered 2015-12-20 – 2015-12-22 (×5): 5 mg via ORAL
  Filled 2015-12-20 (×5): qty 1

## 2015-12-20 MED ORDER — FUROSEMIDE 10 MG/ML IJ SOLN
40.0000 mg | Freq: Once | INTRAMUSCULAR | Status: AC
  Administered 2015-12-20: 40 mg via INTRAVENOUS
  Filled 2015-12-20: qty 4

## 2015-12-20 MED ORDER — ALUM & MAG HYDROXIDE-SIMETH 200-200-20 MG/5ML PO SUSP
30.0000 mL | ORAL | Status: DC | PRN
  Administered 2015-12-20: 30 mL via ORAL
  Filled 2015-12-20: qty 30

## 2015-12-20 NOTE — Progress Notes (Signed)
  Echocardiogram 2D Echocardiogram has been performed.  Stephen Copeland 12/20/2015, 2:00 PM

## 2015-12-20 NOTE — Progress Notes (Signed)
     SUBJECTIVE:Breathing is better. No pain.   Tele: atrial fib, rate 60s.   BP (!) 131/96 (BP Location: Right Arm)   Pulse 79   Temp 97.5 F (36.4 C) (Oral)   Resp (!) 21   Ht 6' (1.829 m)   Wt 181 lb 10.5 oz (82.4 kg)   SpO2 92%   BMI 24.64 kg/m   Intake/Output Summary (Last 24 hours) at 12/20/15 0728 Last data filed at 12/20/15 0500  Gross per 24 hour  Intake              474 ml  Output             1250 ml  Net             -776 ml    PHYSICAL EXAM General: Well developed, well nourished, in no acute distress. Alert and oriented x 3.  Psych:  Good affect, responds appropriately Neck: No JVD. No masses noted.  Lungs: Coarse BS bilaterally.   Heart: Irreg irreg with no murmurs noted. Abdomen: Bowel sounds are present. Soft, non-tender.  Extremities: trace bilateral lower extremity edema.   LABS: Basic Metabolic Panel:  Recent Labs  12/19/15 1144 12/20/15 0325  NA 143 142  K 4.4 3.4*  CL 110 108  CO2 23 25  GLUCOSE 110* 110*  BUN 21* 17  CREATININE 1.07 1.07  CALCIUM 9.7 9.1   CBC:  Recent Labs  12/19/15 1144 12/20/15 0325  WBC 7.5 8.8  HGB 14.6 14.8  HCT 43.9 42.9  MCV 90.5 90.5  PLT 234 209   Cardiac Enzymes:  Recent Labs  12/19/15 1600 12/19/15 2210 12/20/15 0325  TROPONINI 0.03* 0.04* 0.03*   Fasting Lipid Panel:  Recent Labs  12/20/15 0325  CHOL 130  HDL 27*  LDLCALC 93  TRIG 51  CHOLHDL 4.8    Current Meds: . aspirin  81 mg Oral Daily     ASSESSMENT AND PLAN: 75 yo male with history of HTN, HLD, sinus bradycardia, GERD who was admitted 12/19/15 with atrial fib with RVR, undetermined onset.   1. Atrial fib with RVR: This is newly recognized but uncertain duration. He has been anti-coagulated with IV heparin and has been rate controlled with IV Cardizem. CHADSVASC is at least 2 for age and HTN (LVEF and A1C not yet known), warranting anticoagulation to reduce risk of stroke. Risks and benefits of blood thinners reviewed  with patient.  -Will change Diltiazem drip to Cardizem CD 240 mg daily.  -Will stop heparin drip and start Eliquis 5 mg po BID -Echo pending today.   2. Acute diastolic CHF: He diuresed well with single dose of IV Lasix. Still with mild volume overload. Will give one more dose of IV Lasix today. Echo pending today.   3. HTN: BP controlled this am.   4. Hypokalemia: Will replace potassium this am.     Lauree Chandler  10/17/20177:28 AM

## 2015-12-20 NOTE — Progress Notes (Signed)
ANTICOAGULATION CONSULT NOTE  Pharmacy Consult for heparin Indication: atrial fibrillation  No Known Allergies  Patient Measurements: Height: 6' (182.9 cm) Weight: 182 lb 15.7 oz (83 kg) IBW/kg (Calculated) : 77.6 Heparin Dosing Weight: 85.5  Vital Signs: Temp: 97.8 F (36.6 C) (10/16 2009) Temp Source: Oral (10/16 2009) BP: 159/91 (10/17 0200) Pulse Rate: 73 (10/17 0200)  Labs:  Recent Labs  12/19/15 1144 12/19/15 1600 12/19/15 2210 12/20/15 0133  HGB 14.6  --   --   --   HCT 43.9  --   --   --   PLT 234  --   --   --   HEPARINUNFRC  --   --   --  0.38  CREATININE 1.07  --   --   --   TROPONINI  --  0.03* 0.04*  --     Estimated Creatinine Clearance: 66.5 mL/min (by C-G formula based on SCr of 1.07 mg/dL).  Assessment: 75 y.o. male with Afib for heparin  Goal of Therapy:  Heparin level 0.3-0.7 units/ml Monitor platelets by anticoagulation protocol: Yes   Plan:  Continue Heparin at current rate  Follow-up am labs and plan for oral anticoagulation  Dennette Faulconer, Bronson Curb 12/20/2015,2:49 AM

## 2015-12-20 NOTE — Progress Notes (Signed)
[facility] for heparin > eliquis Indication: atrial fibrillation  No Known Allergies  Patient Measurements: Height: 6' (182.9 cm) Weight: 181 lb 10.5 oz (82.4 kg) IBW/kg (Calculated) : 77.6 Heparin Dosing Weight: 85.5  Vital Signs: Temp: 97.2 F (36.2 C) (10/17 0800) Temp Source: Oral (10/17 0800) BP: 141/97 (10/17 0800) Pulse Rate: 85 (10/17 0800)  Labs:  Recent Labs  12/19/15 1144 12/19/15 1600 12/19/15 2210 12/20/15 0133 12/20/15 0325  HGB 14.6  --   --   --  14.8  HCT 43.9  --   --   --  42.9  PLT 234  --   --   --  209  HEPARINUNFRC  --   --   --  0.38  --   CREATININE 1.07  --   --   --  1.07  TROPONINI  --  0.03* 0.04*  --  0.03*    Estimated Creatinine Clearance: 66.5 mL/min (by C-G formula based on SCr of 1.07 mg/dL).  Assessment: 75 y.o. male with new onset Afib, PMH of HTN, HLD, sinus bradycardia, GERD. Pharmacy consulted for heparin, now to transition to eliquis.  Goal of Therapy:  Heparin level 0.3-0.7 units/ml Monitor platelets by anticoagulation protocol: Yes   Plan:  D/c heparin today at 1000 Start elqiuis 5 mg PO BID today at 1000 CBC daily, s/sx bleeding  Carlean Jews, Pharm.D. PGY1 Pharmacy Resident 10/17/20178:29 AM Pager [phone]

## 2015-12-20 NOTE — Progress Notes (Signed)
PROGRESS NOTE    Stephen Copeland  NWG:[id number] DOB: Mar 07, 1940 DOA: 12/19/2015 PCP: No primary care provider on file.   Brief Narrative:  75 y.o. WM PMHx Duodenal ulcer, HTN, Ischemia, Sinus bradycardiaHLD,Scoliosis  Presents to the emergency Department chief complaint gradual worsening shortness of breath. Initial evaluation reveals A. fib with RVR and hypertension.  Information is obtained from the patient. He reports a persistent shortness of breath since 1990. He said it began to get worse about a month ago and in the last 3-4 days he developed a cough generalized weakness. He reports one episode of  chest pain several days ago but states it did not last even a minute. Describes pain as sudden ache left anterior non-radiating. No diaphoresis.  He states he was regurgitating his medication so about a week ago he had a barium swallow. Since that time he just "hasn't felt right". He says he's been having difficulty swallowing and experiences intermittent nausea. He denies headache visual disturbances syncope or near-syncope. He denies abdominal pain. He reports some LE edema and orthopnea.  He also reports history of sinus bradycardia and anterior septals ischemia. He states he's had several stress test in the past. He went to UC today where rapid AF 140's noted. [facility]    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 Requesting to see [facility] secondary to not having his Meal long time and not being transferred to telemetry.    Assessment & Plan:   Principal Problem:   New onset a-fib ([place]) Active Problems:   Atrial fibrillation (HCC)   Hypertension   Acid reflux   Hyperlipidemia   Acute CHF ([place])   Hyperglycemia   Atrial fibrillation, new onset ([place])   Uncontrolled hypertension   Pulmonary hypertension   Chronic systolic CHF (congestive heart failure) ([place])   New onset A. Fib. Chadvasc score 3.  -History of  hypertension ischemia. -Evaluated by cardiology who recommended anticoagulation -Eliquis 5 mg   BID -Diltiazem 240 mg daily -Start Lasix 20 mg daily -If patient's BP tolerates addition of Lasix would add lisinopril. -TSH: Normal -Echocardiogram: Shows CHF see results below   Chronic systolic CHF -Strict in and out since admission -460ml -Daily weight Filed Weights   12/19/15 1612 12/19/15 2009 12/20/15 0320  Weight: 85.3 kg (188 lb) 83 kg (182 lb 15.7 oz) 82.4 kg (181 lb 10.5 oz)    HTN -See A. fib  Pulmonary hypertension -See CHF  Hyperlipidemia.  -Start Lipitor 20 mg daily  -lipid panel: Within NCEP guidelines however given patient's CHF would benefit from statin lowering drug  Lower extremity edema.  -See CHF  Hypokalemia -Potassium goal> 4 -K-Dur 40 mEq BID -Magnesium pending    DVT prophylaxis: Eliquis Code Status: Full Family Communication: Wife present Disposition Plan: Discharge when cleared by cardiology    Consultants:  Dr. Burnell Blanks, Cardiology    Procedures/Significant Events:  10/17 Echocardiogram:Left ventricle: moderate concentric hypertrophy. -LVEF =40%. Diffuse hypokinesis.Severe hypokinesis of the basal-midinferolateral, inferior, and inferoseptal myocardium. - Ventricular septum: intraventricular conduction delay. - Left atrium: severely dilated.--Right atrium: mildly dilated. - Pulmonary arteries: PA peak pressure: 35 mm Hg (S).  Cultures None  Antimicrobials: None   Devices None   LINES / TUBES:  None    Continuous Infusions:     Objective: Vitals:   12/20/15 0900 12/20/15 1000 12/20/15 1215 12/20/15 1400  BP: 139/89 (!) 144/99 (!) 141/86 (!) 148/86  Pulse: (!) 46 90 83 (!)  50  Resp: (!) 23 19 (!) 22 19  Temp:   97.3 F (36.3 C)   TempSrc:   Oral   SpO2: 95% 94% 91% 92%  Weight:      Height:        Intake/Output Summary (Last 24 hours) at 12/20/15 1638 Last data filed at 12/20/15 1030   Gross per 24 hour  Intake              793 ml  Output             1250 ml  Net             -457 ml   Filed Weights   12/19/15 1612 12/19/15 2009 12/20/15 0320  Weight: 85.3 kg (188 lb) 83 kg (182 lb 15.7 oz) 82.4 kg (181 lb 10.5 oz)    Examination:  General:  A/O 4, 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 Positive bilateral pedal edema 1+ to knees. Skin: Negative rashes, lesions, ulcers Psychiatric:  Negative depression, negative anxiety, negative fatigue, negative mania, positive paranoia  Central nervous system:  Cranial nerves II through XII intact, tongue/uvula midline, all extremities muscle strength 5/5, sensation intact throughout, negative dysarthria, negative expressive aphasia, negative receptive aphasia.  .     Data Reviewed: Care during the described time interval was provided by me .  I have reviewed this patient's available data, including medical history, events of note, physical examination, and all test results as part of my evaluation. I have personally reviewed and interpreted all radiology studies.  CBC:  Recent Labs Lab 12/19/15 1144 12/20/15 0325  WBC 7.5 8.8  HGB 14.6 14.8  HCT 43.9 42.9  MCV 90.5 90.5  PLT 234 314   Basic Metabolic Panel:  Recent Labs Lab 12/19/15 1144 12/20/15 0325  NA 143 142  K 4.4 3.4*  CL 110 108  CO2 23 25  GLUCOSE 110* 110*  BUN 21* 17  CREATININE  1.07 1.07  CALCIUM 9.7 9.1  MG  --  1.8   GFR: Estimated Creatinine Clearance: 66.5 mL/min (by C-G formula based on SCr of 1.07 mg/dL). Liver Function Tests:  Recent Labs Lab 12/19/15 1600  AST 35  ALT 55  ALKPHOS 107  BILITOT 2.0*  PROT 6.4*  ALBUMIN 3.8   No results for input(s): LIPASE, AMYLASE in the last 168 hours. No results for input(s): AMMONIA in the last 168 hours. Coagulation Profile: No results for input(s): INR, PROTIME in the last 168 hours. Cardiac Enzymes:  Recent Labs Lab 12/19/15 1600 12/19/15 2210 12/20/15 0325  TROPONINI 0.03* 0.04* 0.03*   BNP (last 3 results) No results for input(s): PROBNP in the last 8760 hours. HbA1C:  Recent Labs  12/19/15 1600  HGBA1C 5.9*   CBG: No results for input(s): GLUCAP in the last 168 hours. Lipid Profile:  Recent Labs  12/20/15 0325  CHOL 130  HDL 27*  LDLCALC 93  TRIG 51  CHOLHDL 4.8   Thyroid Function Tests:  Recent Labs  12/19/15 1600  TSH 0.885   Anemia Panel:  No results for input(s): VITAMINB12, FOLATE, FERRITIN, TIBC, IRON, RETICCTPCT in the last 72 hours. Urine analysis: No results found for: COLORURINE, APPEARANCEUR, LABSPEC, PHURINE, GLUCOSEU, HGBUR, BILIRUBINUR, KETONESUR, PROTEINUR, UROBILINOGEN, NITRITE, LEUKOCYTESUR Sepsis Labs: @LABRCNTIP (procalcitonin:4,lacticidven:4)  ) Recent Results (from the past 240 hour(s))  MRSA PCR Screening     Status: None   Collection Time: 12/19/15  8:07 PM  Result Value Ref Range Status   MRSA by PCR NEGATIVE NEGATIVE Final    Comment:        The GeneXpert MRSA Assay (FDA approved for NASAL specimens only), is one component of a comprehensive MRSA colonization surveillance program. It is not intended to diagnose MRSA infection nor to guide or monitor treatment for MRSA infections.          Radiology Studies: Dg Chest 2 View  Result Date: 12/19/2015 CLINICAL DATA:  Patient with chronic shortness of breath. EXAM: CHEST  2 VIEW  COMPARISON:  None. FINDINGS: Cardiac contours are enlarged. Tortuosity of the thoracic aorta. Bilateral mid and lower lung heterogeneous pulmonary opacities. Pulmonary vascular redistribution. Perihilar interstitial opacities. Small bilateral pleural effusions. Thoracic spine degenerative changes. IMPRESSION: Cardiomegaly, pulmonary vascular redistribution and mild bilateral perihilar interstitial opacities favored to represent mild edema. Basilar heterogeneous opacities may represent atelectasis, scarring or infection. Electronically Signed   By: Lovey Newcomer M.D.   On: 12/19/2015 12:37        Scheduled Meds: . apixaban  5 mg Oral BID  . aspirin  81 mg Oral Daily  . atorvastatin  20 mg Oral q1800  . diltiazem  240 mg Oral Daily  . furosemide  20 mg Oral Daily  . potassium chloride  40 mEq Oral BID   Continuous Infusions:     LOS: 1 day    Time spent: 40 minutes    Gamaliel Charney, Geraldo Docker, MD [facility] Pager [phone]   If 7PM-7AM, please contact night-coverage [email or web address] Password [facility] 12/20/2015, 4:38 PM

## 2015-12-21 DIAGNOSIS — I5021 Acute systolic (congestive) heart failure: Secondary | ICD-10-CM

## 2015-12-21 DIAGNOSIS — I420 Dilated cardiomyopathy: Secondary | ICD-10-CM

## 2015-12-21 LAB — BASIC METABOLIC PANEL
Anion gap: 9 (ref 5–15)
BUN: 15 mg/dL (ref 6–20)
CHLORIDE: 104 mmol/L (ref 101–111)
CO2: 27 mmol/L (ref 22–32)
CREATININE: 1.15 mg/dL (ref 0.61–1.24)
Calcium: 9.4 mg/dL (ref 8.9–10.3)
GFR calc non Af Amer: >60 mL/min (ref >60)
Glucose, Bld: 108 mg/dL — ABNORMAL HIGH (ref 65–99)
POTASSIUM: 4.3 mmol/L (ref 3.5–5.1)
Sodium: 140 mmol/L (ref 135–145)

## 2015-12-21 LAB — CBC
HEMATOCRIT: 44.8 % (ref 39.0–52.0)
HEMOGLOBIN: 15.1 g/dL (ref 13.0–17.0)
MCH: 30.5 pg (ref 26.0–34.0)
MCHC: 33.7 g/dL (ref 30.0–36.0)
MCV: 90.5 fL (ref 78.0–100.0)
Platelets: 242 10*3/uL (ref 150–400)
RBC: 4.95 MIL/uL (ref 4.22–5.81)
RDW: 14.1 % (ref 11.5–15.5)
WBC: 11.1 10*3/uL — ABNORMAL HIGH (ref 4.0–10.5)

## 2015-12-21 LAB — MAGNESIUM: MAGNESIUM: 1.9 mg/dL (ref 1.7–2.4)

## 2015-12-21 MED ORDER — AMIODARONE HCL IN DEXTROSE 360-4.14 MG/200ML-% IV SOLN
30.0000 mg/h | INTRAVENOUS | Status: DC
  Administered 2015-12-22: 30 mg/h via INTRAVENOUS
  Filled 2015-12-21: qty 200

## 2015-12-21 MED ORDER — AMIODARONE LOAD VIA INFUSION
150.0000 mg | Freq: Once | INTRAVENOUS | Status: AC
  Administered 2015-12-21: 150 mg via INTRAVENOUS
  Filled 2015-12-21: qty 83.34

## 2015-12-21 MED ORDER — POTASSIUM CHLORIDE CRYS ER 20 MEQ PO TBCR
40.0000 meq | EXTENDED_RELEASE_TABLET | Freq: Every day | ORAL | Status: DC
  Administered 2015-12-22: 40 meq via ORAL
  Filled 2015-12-21: qty 2

## 2015-12-21 MED ORDER — LISINOPRIL 5 MG PO TABS
5.0000 mg | ORAL_TABLET | Freq: Every day | ORAL | Status: DC
  Administered 2015-12-22: 5 mg via ORAL
  Filled 2015-12-21: qty 1

## 2015-12-21 MED ORDER — AMIODARONE HCL IN DEXTROSE 360-4.14 MG/200ML-% IV SOLN
60.0000 mg/h | INTRAVENOUS | Status: AC
  Administered 2015-12-21: 60 mg/h via INTRAVENOUS
  Filled 2015-12-21 (×2): qty 200

## 2015-12-21 MED ORDER — METOPROLOL TARTRATE 25 MG PO TABS
25.0000 mg | ORAL_TABLET | Freq: Two times a day (BID) | ORAL | Status: DC
  Administered 2015-12-21 – 2015-12-22 (×2): 25 mg via ORAL
  Filled 2015-12-21 (×2): qty 1

## 2015-12-21 NOTE — Progress Notes (Signed)
PROGRESS NOTE  Stephen Copeland TSV:[id number] DOB: Sep 09, 1940 DOA: 12/19/2015 PCP: No primary care provider on file.   LOS: 2 days   Brief Narrative: Patient with a history of HTN, HLD, GERD and sinus bradycardia presented to the ED for gradual dyspnea over the past month. He developed cough and generalized weakness in the past 4-5 days. Patient has difficulty swallowing, intermittent nausea, mild LE edema and orthopnea. Denies syncope, near-syncope, abdominal pain. Initial evaluation revealed atrial fibrillation with RVR and HTN. CXR 12/20/15 findings favored to represent mild edema.Troponin I 12/20/15 was 0.03. Echo 10/17 revealed EF 40%.   Assessment & Plan: Principal Problem:   New onset a-fib ([place]) Active Problems:   Atrial fibrillation (HCC)   Hypertension   Acid reflux   Hyperlipidemia   Acute CHF ([place])   Hyperglycemia   Atrial fibrillation with RVR (HCC)   Uncontrolled hypertension   Pulmonary hypertension   Chronic systolic CHF (congestive heart failure) (HCC)   Acute systolic CHF (congestive heart failure) (HCC)   Congestive dilated cardiomyopathy (H[facility])   Atrial fib with RVR - Discussed with cardiology, given presence of systolic CHF, discontinue Cardizem and start beta blockers - start amiodarone - Plan TEE guided DCCV on Friday - Anti-coagulated on Eliquis 5 mg po BID (he received 2 dose on 12/20/15). CHADSVASC is at least 3 warranting anticoagulation to reduce risk of stroke.  Acute systolic CHF/Cardiomyopathy - unknown chronicity  - echo 10/17 with reduced EF 40% - Diuresed well with IV Lasix.  - Per cardiology will change Cardizem to beta blocker. Start lisinopril.   HTN - BP controlled this am, monitor while on ACEI / BB  Hypokalemia - Resolved with replacement  Elevated troponin - flat trend, likely demand   DVT prophylaxis: Eliquis Code Status: Full Family Communication: Wife at beside. Disposition Plan: TBD  Consultants:    Cardiology  Procedures:   2D echo: 12/20/15  Left ventricle: There was moderate concentric hypertrophy. Systolic function was moderately reduced. The estimated ejection fraction was 40%.   Antimicrobials:  None  Subjective: Patient denies dyspnea, chest pain, palpitations, and edema.  Objective: Vitals:   12/20/15 1644 12/20/15 1917 12/21/15 0513 12/21/15 1320  BP: 137/80 136/87 (!) 140/96 (!) 148/85  Pulse: 71 98 (!) 55 85  Resp: (!) 25 20 18 18   Temp: 97.4 F (36.3 C) 98 F (36.7 C) 98.5 F (36.9 C) 98.1 F (36.7 C)  TempSrc: Oral Oral Oral Oral  SpO2: 95% 94% 90% 93%  Weight:  83.7 kg (184 lb 9.6 oz) 83.7 kg (184 lb 8.4 oz)   Height:  6\' 2"  (1.88 m)      Intake/Output Summary (Last 24 hours) at 12/21/15 1351 Last data filed at 12/21/15 1300  Gross per 24 hour  Intake              840 ml  Output             1200 ml  Net             -360 ml   Filed Weights   12/20/15 0320 12/20/15 1917 12/21/15 0513  Weight: 82.4 kg (181 lb 10.5 oz) 83.7 kg (184 lb 9.6 oz) 83.7 kg (184 lb 8.4 oz)    Examination: Constitutional: NAD Vitals:   12/20/15 1644 12/20/15 1917 12/21/15 0513 12/21/15 1320  BP: 137/80 136/87 (!) 140/96 (!) 148/85  Pulse: 71 98 (!) 55 85  Resp: (!) 25 20 18 18   Temp: 97.4 F (36.3 C) 98 F (36.7  C) 98.5 F (36.9 C) 98.1 F (36.7 C)  TempSrc: Oral Oral Oral Oral  SpO2: 95% 94% 90% 93%  Weight:  83.7 kg (184 lb 9.6 oz) 83.7 kg (184 lb 8.4 oz)   Height:  6\' 2"  (1.88 m)     Eyes: lids and conjunctivae normal ENMT: Mucous membranes are moist.  Respiratory: clear to auscultation bilaterally, no wheezing, no crackles. Normal respiratory effort. No accessory muscle use.  Cardiovascular: irregular rate and rhythm, no murmurs / rubs / gallops. No LE edema.  Musculoskeletal: no clubbing / cyanosis.  Skin: no rashes, lesions, ulcers.   Data Reviewed: I have personally reviewed following labs and imaging studies  CBC:  Recent Labs Lab  12/19/15 1144 12/20/15 0325 12/21/15 0233  WBC 7.5 8.8 11.1*  HGB 14.6 14.8 15.1  HCT 43.9 42.9 44.8  MCV 90.5 90.5 90.5  PLT 234 209 025   Basic Metabolic Panel:  Recent Labs Lab 12/19/15 1144 12/20/15 0325 12/21/15 0233  NA 143 142 140  K 4.4 3.4* 4.3  CL 110 108 104  CO2 23 25 27   GLUCOSE 110* 110* 108*  BUN 21* 17 15  CREATININE 1.07 1.07 1.15  CALCIUM 9.7 9.1 9.4  MG  --  1.8 1.9   GFR: Estimated Creatinine Clearance: 65.5 mL/min (by C-G formula based on SCr of 1.15 mg/dL). Liver Function Tests:  Recent Labs Lab 12/19/15 1600  AST 35  ALT 55  ALKPHOS 107  BILITOT 2.0*  PROT 6.4*  ALBUMIN 3.8   No results for input(s): LIPASE, AMYLASE in the last 168 hours. No results for input(s): AMMONIA in the last 168 hours. Coagulation Profile: No results for input(s): INR, PROTIME in the last 168 hours. Cardiac Enzymes:  Recent Labs Lab 12/19/15 1600 12/19/15 2210 12/20/15 0325  TROPONINI 0.03* 0.04* 0.03*   BNP (last 3 results) No results for input(s): PROBNP in the last 8760 hours. HbA1C:  Recent Labs  12/19/15 1600  HGBA1C 5.9*   CBG: No results for input(s): GLUCAP in the last 168 hours. Lipid Profile:  Recent Labs  12/20/15 0325  CHOL 130  HDL 27*  LDLCALC 93  TRIG 51  CHOLHDL 4.8   Thyroid Function Tests:  Recent Labs  12/19/15 1600  TSH 0.885   Anemia Panel: No results for input(s): VITAMINB12, FOLATE, FERRITIN, TIBC, IRON, RETICCTPCT in the last 72 hours. Urine analysis: No results found for: COLORURINE, APPEARANCEUR, LABSPEC, PHURINE, GLUCOSEU, HGBUR, BILIRUBINUR, KETONESUR, PROTEINUR, UROBILINOGEN, NITRITE, LEUKOCYTESUR Sepsis Labs: Invalid input(s): PROCALCITONIN, LACTICIDVEN  Recent Results (from the past 240 hour(s))  MRSA PCR Screening     Status: None   Collection Time: 12/19/15  8:07 PM  Result Value Ref Range Status   MRSA by PCR NEGATIVE NEGATIVE Final    Comment:        The GeneXpert MRSA Assay (FDA approved  for NASAL specimens only), is one component of a comprehensive MRSA colonization surveillance program. It is not intended to diagnose MRSA infection nor to guide or monitor treatment for MRSA infections.       Radiology Studies: No results found.   Scheduled Meds: . apixaban  5 mg Oral BID  . aspirin  81 mg Oral Daily  . atorvastatin  20 mg Oral q1800  . furosemide  20 mg Oral Daily  . lisinopril  5 mg Oral Daily  . metoprolol tartrate  25 mg Oral BID  . [START ON 12/22/2015] potassium chloride  40 mEq Oral Daily   Continuous Infusions: .  amiodarone 60 mg/hr (12/21/15 1329)   Followed by  . amiodarone         Marzetta Board, MD, PhD [facility] Pager [phone]  If 7PM-7AM, please contact night-coverage [email or web address] Password [email or web address] 12/21/2015, 1:51 PM

## 2015-12-21 NOTE — Progress Notes (Signed)
Insurance check completed for Eliquis Eliquis is a covered drug, no prior auth needed there is a co pay 30 day supply is $24.00 but if he uses mail order it will be $20.00 for a 90 day supply (it will take 14 days to process so patient might want to ask Dr. for a temporary supply so he will have until mail order arrives)

## 2015-12-21 NOTE — Discharge Instructions (Addendum)
Information on my medicine - ELIQUIS (apixaban)  This medication education was reviewed with me or my healthcare representative as part of my discharge preparation.  The pharmacist that spoke with me during my hospital stay was:  Tad Moore, [facility]  Why was Eliquis prescribed for you? Eliquis was prescribed for you to reduce the risk of a blood clot forming that can cause a stroke if you have a medical condition called atrial fibrillation (a type of irregular heartbeat).  What do You need to know about Eliquis ? Take your Eliquis TWICE DAILY - one tablet in the morning and one tablet in the evening with or without food. If you have difficulty swallowing the tablet whole please discuss with your pharmacist how to take the medication safely.  Take Eliquis exactly as prescribed by your doctor and DO NOT stop taking Eliquis without talking to the doctor who prescribed the medication.  Stopping may increase your risk of developing a stroke.  Refill your prescription before you run out.  After discharge, you should have regular check-up appointments with your healthcare provider that is prescribing your Eliquis.  In the future your dose may need to be changed if your kidney function or weight changes by a significant amount or as you get older.  What do you do if you miss a dose? If you miss a dose, take it as soon as you remember on the same day and resume taking twice daily.  Do not take more than one dose of ELIQUIS at the same time to make up a missed dose.  Important Safety Information A possible side effect of Eliquis is bleeding. You should call your healthcare provider right away if you experience any of the following: ? Bleeding from an injury or your nose that does not stop. ? Unusual colored urine (red or dark brown) or unusual colored stools (red or black). ? Unusual bruising for unknown reasons. ? A serious fall or if you hit your head (even if there is no bleeding).  Some  medicines may interact with Eliquis and might increase your risk of bleeding or clotting while on Eliquis. To help avoid this, consult your healthcare provider or pharmacist prior to using any new prescription or non-prescription medications, including herbals, vitamins, non-steroidal anti-inflammatory drugs (NSAIDs) and supplements.  This website has more information on Eliquis (apixaban): http://www.eliquis.com/eliquis/home    Follow with PCP in 2-3 weeks Follow up with cardiology as scheduled  Take amiodarone 400 mg twice daily for a week then once daily.  Please get a complete blood count and chemistry panel checked by your Primary MD at your next visit, and again as instructed by your Primary MD. Please get your medications reviewed and adjusted by your Primary MD.  Please request your Primary MD to go over all Hospital Tests and Procedure/Radiological results at the follow up, please get all Hospital records sent to your Prim MD by signing hospital release before you go home.  If you had Pneumonia of Lung problems at the Hospital: Please get a 2 view Chest X ray done in 6-8 weeks after hospital discharge or sooner if instructed by your Primary MD.  If you have Congestive Heart Failure: Please call your Cardiologist or Primary MD anytime you have any of the following symptoms:  1) 3 pound weight gain in 24 hours or 5 pounds in 1 week  2) shortness of breath, with or without a dry hacking cough  3) swelling in the hands, feet or stomach  4) if you have to sleep on extra pillows at night in order to breathe  Follow cardiac low salt diet and 1.5 lit/day fluid restriction.  If you have diabetes Accuchecks 4 times/day, Once in AM empty stomach and then before each meal. Log in all results and show them to your primary doctor at your next visit. If any glucose reading is under 80 or above 300 call your primary MD immediately.  If you have Seizure/Convulsions/Epilepsy: Please do not  drive, operate heavy machinery, participate in activities at heights or participate in high speed sports until you have seen by Primary MD or a Neurologist and advised to do so again.  If you had Gastrointestinal Bleeding: Please ask your Primary MD to check a complete blood count within one week of discharge or at your next visit. Your endoscopic/colonoscopic biopsies that are pending at the time of discharge, will also need to followed by your Primary MD.  Get Medicines reviewed and adjusted. Please take all your medications with you for your next visit with your Primary MD  Please request your Primary MD to go over all hospital tests and procedure/radiological results at the follow up, please ask your Primary MD to get all Hospital records sent to his/her office.  If you experience worsening of your admission symptoms, develop shortness of breath, life threatening emergency, suicidal or homicidal thoughts you must seek medical attention immediately by calling 911 or calling your MD immediately  if symptoms less severe.  You must read complete instructions/literature along with all the possible adverse reactions/side effects for all the Medicines you take and that have been prescribed to you. Take any new Medicines after you have completely understood and accpet all the possible adverse reactions/side effects.   Do not drive or operate heavy machinery when taking Pain medications.   Do not take more than prescribed Pain, Sleep and Anxiety Medications  Special Instructions: If you have smoked or chewed Tobacco  in the last 2 yrs please stop smoking, stop any regular Alcohol  and or any Recreational drug use.  Wear Seat belts while driving.  Please note You were cared for by a hospitalist during your hospital stay. If you have any questions about your discharge medications or the care you received while you were in the hospital after you are discharged, you can call the unit and asked to speak  with the hospitalist on call if the hospitalist that took care of you is not available. Once you are discharged, your primary care physician will handle any further medical issues. Please note that NO REFILLS for any discharge medications will be authorized once you are discharged, as it is imperative that you return to your primary care physician (or establish a relationship with a primary care physician if you do not have one) for your aftercare needs so that they can reassess your need for medications and monitor your lab values.  You can reach the hospitalist office at phone [phone] or fax [phone]   If you do not have a primary care physician, you can call [phone] for a physician referral.  Activity: As tolerated with Full fall precautions use walker/cane & assistance as needed  Diet: heart healthy  Disposition Home

## 2015-12-21 NOTE — Care Management Note (Addendum)
Case Management Note Marvetta Gibbons RN, BSN Unit 2W-Case Manager [phone]  Patient Details  Name: Stephen Copeland MRN: [id number] Date of Birth: 18-Feb-1941  Subjective/Objective:    Pt admitted with new onset afib                Action/Plan: PTA pt lived at home with wife- independent- referral received for Eliquis- per insurance check- Eliquis is a covered drug, no prior auth needed there is a co pay 30 day supply is 24.00 but if he uses mail order it will be 20.00 for a 90 day supply (it will take 14 days to process so patient might want to ask Dr. for a temporary supply so he will have until mail order arrives) - spoke with pt and wife at bedside- pt has [employer]- states that he uses [facility]- he does not use mail order- provided pt with coverage info and gave pt 30 day free card to use on discharge. CM and CSW also spoke with pt and wife to try to problem solve how to get wife back to [facility] where their car is so that she can get home- provided wife with [facility] cab phone # to call which can take her back to the Urgent Care- wife states that she can drive- just usually doesn't- she then plans to have to cab driver drive to home so that she can follow behind as to "not to get lost"- pt and wife ok with this plan. Wife will then plan to take cab back to hospital if needed.   Expected Discharge Date:                  Expected Discharge Plan:  Home/Self Care  In-House Referral:  Clinical Social Work  Discharge planning Services  CM Consult, Medication Assistance  Post Acute Care Choice:    Choice offered to:     DME Arranged:    DME Agency:     HH Arranged:    HH Agency:     Status of Service:  In process, will continue to follow  If discussed at Long Length of Stay Meetings, dates discussed:    Additional Comments:  Dawayne Patricia, RN 12/21/2015, 2:23 PM

## 2015-12-21 NOTE — Progress Notes (Signed)
SUBJECTIVE: No dyspnea, chest pain or palpitations.   BP (!) 140/96 (BP Location: Left Arm)   Pulse (!) 55   Temp 98.5 F (36.9 C) (Oral)   Resp 18   Ht 6\' 2"  (1.88 m)   Wt 184 lb 8.4 oz (83.7 kg)   SpO2 90%   BMI 23.69 kg/m   Intake/Output Summary (Last 24 hours) at 12/21/15 1033 Last data filed at 12/21/15 0900  Gross per 24 hour  Intake              600 ml  Output             1200 ml  Net             -600 ml    PHYSICAL EXAM General: Well developed, well nourished, in no acute distress. Alert and oriented x 3.  Psych:  Good affect, responds appropriately Neck: No JVD. No masses noted.  Lungs: Clear bilaterally with no wheezes or rhonci noted.  Heart: Irreg irreg with no murmurs noted. Abdomen: Bowel sounds are present. Soft, non-tender.  Extremities: No lower extremity edema.   LABS: Basic Metabolic Panel:  Recent Labs  12/20/15 0325 12/21/15 0233  NA 142 140  K 3.4* 4.3  CL 108 104  CO2 25 27  GLUCOSE 110* 108*  BUN 17 15  CREATININE 1.07 1.15  CALCIUM 9.1 9.4  MG 1.8 1.9   CBC:  Recent Labs  12/20/15 0325 12/21/15 0233  WBC 8.8 11.1*  HGB 14.8 15.1  HCT 42.9 44.8  MCV 90.5 90.5  PLT 209 242   Cardiac Enzymes:  Recent Labs  12/19/15 1600 12/19/15 2210 12/20/15 0325  TROPONINI 0.03* 0.04* 0.03*   Fasting Lipid Panel:  Recent Labs  12/20/15 0325  CHOL 130  HDL 27*  LDLCALC 93  TRIG 51  CHOLHDL 4.8    Current Meds: . apixaban  5 mg Oral BID  . aspirin  81 mg Oral Daily  . atorvastatin  20 mg Oral q1800  . diltiazem  240 mg Oral Daily  . furosemide  20 mg Oral Daily  . potassium chloride  40 mEq Oral BID   Echo 12/21/15: Left ventricle: The cavity size was normal. There was moderate   concentric hypertrophy. Systolic function was moderately reduced.   The estimated ejection fraction was 40%. Diffuse hypokinesis.   There is severe hypokinesis of the basal-midinferolateral,   inferior, and inferoseptal myocardium. -  Ventricular septum: Septal motion showed moderate paradox. These   changes are consistent with intraventricular conduction delay. - Aortic valve: Poorly visualized. Trileaflet; normal thickness,   mildly calcified leaflets. - Mitral valve: There was mild regurgitation. - Left atrium: The atrium was severely dilated. - Right atrium: The atrium was mildly dilated. - Pulmonic valve: There was mild regurgitation. - Pulmonary arteries: PA peak pressure: 35 mm Hg (S).  ASSESSMENT AND PLAN: 75 yo male with history of HTN, HLD, sinus bradycardia, GERD who was admitted 12/19/15 with atrial fib with RVR, undetermined onset.   1. Atrial fib with RVR:  His rates have not been well controlled on po Cardizem. Echo with LVEF=40%. He has no ischemic symptoms. Will stop Cardizem and start beta blocker therapy. Will load with IV amiodarone today and tomorrow. Will plan TEE guided DCCV on Friday. He is now anti-coagulated on Eliquis 5 mg po BID (he received 2 dose on 12/20/15). CHADSVASC is at least 3 warranting anticoagulation to reduce risk of stroke. Risks and benefits of  blood thinners reviewed with patient.   2. Acute systolic CHF/Cardiomyopathy: He diuresed well with IV Lasix. Now with new finding of reduced LV systolic function. Will change Cardizem to beta blocker. Start Ace-inh.   3. HTN: BP controlled this am.   4. Hypokalemia: Resolved with replacement.     Lauree Chandler  10/18/201710:33 AM

## 2015-12-22 LAB — CBC
HEMATOCRIT: 42.9 % (ref 39.0–52.0)
Hemoglobin: 14.4 g/dL (ref 13.0–17.0)
MCH: 30.3 pg (ref 26.0–34.0)
MCHC: 33.6 g/dL (ref 30.0–36.0)
MCV: 90.3 fL (ref 78.0–100.0)
Platelets: 237 10*3/uL (ref 150–400)
RBC: 4.75 MIL/uL (ref 4.22–5.81)
RDW: 14.2 % (ref 11.5–15.5)
WBC: 9.2 10*3/uL (ref 4.0–10.5)

## 2015-12-22 LAB — BASIC METABOLIC PANEL
Anion gap: 7 (ref 5–15)
BUN: 19 mg/dL (ref 6–20)
CALCIUM: 9 mg/dL (ref 8.9–10.3)
CO2: 27 mmol/L (ref 22–32)
CREATININE: 1.14 mg/dL (ref 0.61–1.24)
Chloride: 108 mmol/L (ref 101–111)
GFR calc Af Amer: >60 mL/min (ref >60)
GFR calc non Af Amer: >60 mL/min (ref >60)
GLUCOSE: 121 mg/dL — AB (ref 65–99)
Potassium: 4.1 mmol/L (ref 3.5–5.1)
Sodium: 142 mmol/L (ref 135–145)

## 2015-12-22 LAB — MAGNESIUM: Magnesium: 2.1 mg/dL (ref 1.7–2.4)

## 2015-12-22 MED ORDER — AMIODARONE HCL 200 MG PO TABS: 400.0000 mg | ORAL_TABLET | Freq: Every day | ORAL | Status: DC

## 2015-12-22 MED ORDER — ATORVASTATIN CALCIUM 20 MG PO TABS: 20.0000 mg | ORAL_TABLET | Freq: Every day | ORAL | 1 refills | Status: DC

## 2015-12-22 MED ORDER — METOPROLOL TARTRATE 25 MG PO TABS: 25.0000 mg | ORAL_TABLET | Freq: Two times a day (BID) | ORAL | 1 refills | Status: DC

## 2015-12-22 MED ORDER — AMIODARONE HCL 400 MG PO TABS: 400.0000 mg | ORAL_TABLET | Freq: Two times a day (BID) | ORAL | 0 refills | Status: DC

## 2015-12-22 MED ORDER — LISINOPRIL 5 MG PO TABS: 5.0000 mg | ORAL_TABLET | Freq: Every day | ORAL | 1 refills | Status: DC

## 2015-12-22 MED ORDER — APIXABAN 5 MG PO TABS: 5.0000 mg | ORAL_TABLET | Freq: Two times a day (BID) | ORAL | 1 refills | Status: DC

## 2015-12-22 MED ORDER — FUROSEMIDE 20 MG PO TABS
20.0000 mg | ORAL_TABLET | Freq: Every day | ORAL | 1 refills | Status: DC
Start: 2015-12-22 — End: 2016-02-20

## 2015-12-22 MED ORDER — AMIODARONE HCL 200 MG PO TABS
400.0000 mg | ORAL_TABLET | Freq: Two times a day (BID) | ORAL | Status: DC
  Administered 2015-12-22: 400 mg via ORAL
  Filled 2015-12-22: qty 2

## 2015-12-22 NOTE — Discharge Summary (Signed)
Physician Discharge Summary  Stephen Copeland ONG:[id number] DOB: 1940-06-12 DOA: 12/19/2015  PCP: No primary care provider on file.  Admit date: 12/19/2015 Discharge date: 12/22/2015  Admitted From: home Disposition:  home  Recommendations for Outpatient Follow-up:  1. Follow up with cardiology as scheduled 2. Patient started on Eliquis for A fib  Home Health: none Equipment/Devices: none  Discharge Condition: stable CODE STATUS: Full Diet recommendation: heart healthy  HPI: Stephen Copeland is a 75 y.o. male with medical history significant for duodenal ulcer, hypertension, hyperlipidemia, GERD presents to the emergency Department chief complaint gradual worsening shortness of breath. Initial evaluation reveals A. fib with RVR and hypertension. Information is obtained from the patient. He reports a persistent shortness of breath since 1990. He said it began to get worse about a month ago and in the last 3-4 days he developed a cough generalized weakness. He reports one episode of  chest pain several days ago but states it did not last even a minute. Describes pain as sudden ache left anterior non-radiating. No diaphoresis.  He states he was regurgitating his medication so about a week ago he had a barium swallow. Since that time he just "hasn't felt right". He says he's been having difficulty swallowing and experiences intermittent nausea. He denies headache visual disturbances syncope or near-syncope. He denies abdominal pain. He reports some LE edema and orthopnea.  He also reports history of sinus bradycardia and anterior septals ischemia. He states he's had several stress test in the past. He went to UC today where rapid AF 140's noted. [facility] ED Course: Emergency department is afebrile hypertensive A. fib with RVR not hypoxic  Hospital Course: Discharge Diagnoses:  Principal Problem:   New onset a-fib ([place]) Active Problems:   Atrial fibrillation (HCC)  Hypertension   Acid reflux   Hyperlipidemia   Acute CHF ([place])   Hyperglycemia   Atrial fibrillation with RVR (HCC)   Uncontrolled hypertension   Pulmonary hypertension   Chronic systolic CHF (congestive heart failure) (HCC)   Acute systolic CHF (congestive heart failure) (HCC)   Congestive dilated cardiomyopathy (HCC)  Atrial fib with RVR - patient was admitted to the hospital with A fib with RVR. He was started initially on Cardizem infusion with good rate controlled and was transitioned to po Cardizem. 2D echo was obtained and showed acute systolic CHF, and his CCB was discontinue and was transitioned to BB and was started on amiodarone. Initial plans were for DCCV on 10/20 however since he was rate controlled, asymptomatic and asking to go home cardiology cleared patient for home discharge with close outpatient follow up.  Acute systolic CHF/Cardiomyopathy - unknown if there is a chronic component  - echo 10/17 with reduced EF 40%, diuresed well with IV Lasix, he is now euvolemic, transitioned to po Lasix and will continue this at home. Started on ACEI on d/c HTN  Hypokalemia - Resolved with replacement Elevated troponin - flat trend, likely demand. WIll have ischemic workup likely as an outpatient   Discharge Instructions  Discharge Instructions    Amb referral to AFIB Clinic    Complete by:  As directed        Medication List    STOP taking these medications   verapamil 180 MG CR tablet Commonly known as:  CALAN-SR     TAKE these medications   amiodarone 400 MG tablet Commonly known as:  PACERONE Take 1 tablet (400 mg total) by mouth 2 (two) times daily. Twice daily  for a week then once daily   apixaban 5 MG Tabs tablet Commonly known as:  ELIQUIS Take 1 tablet (5 mg total) by mouth 2 (two) times daily.   aspirin 81 MG chewable tablet Chew 81 mg by mouth daily.   atorvastatin 20 MG tablet Commonly known as:  LIPITOR Take 1 tablet (20 mg total) by mouth daily at 6  PM.   ENSURE PLUS Liqd Take 237 mLs by mouth daily.   furosemide 20 MG tablet Commonly known as:  LASIX Take 1 tablet (20 mg total) by mouth daily.   guaiFENesin-dextromethorphan 100-10 MG/5ML syrup Commonly known as:  ROBITUSSIN DM Take 5 mLs by mouth every 4 (four) hours as needed for cough. Notes to patient:  Not given in hospital, please continue to take at home as needed per MD order   lisinopril 5 MG tablet Commonly known as:  PRINIVIL,ZESTRIL Take 1 tablet (5 mg total) by mouth daily.   metoprolol tartrate 25 MG tablet Commonly known as:  LOPRESSOR Take 1 tablet (25 mg total) by mouth 2 (two) times daily.      Follow-up Information    Cecilie Kicks, NP Follow up on 12/29/2015.   Specialties:  Cardiology, Radiology Why:  at 10:00 am for hospital follow up Contact information: [place] [address] [phone]          No Known Allergies  Consultations:  Cardiology   Procedures/Studies:  2D echo Study Conclusions - Left ventricle: The cavity size was normal. There was moderate concentric hypertrophy. Systolic function was moderately reduced. The estimated ejection fraction was 40%. Diffuse hypokinesis. There is severe hypokinesis of the basal-midinferolateral, inferior, and inferoseptal myocardium. - Ventricular septum: Septal motion showed moderate paradox. These changes are consistent with intraventricular conduction delay. - Aortic valve: Poorly visualized. Trileaflet; normal thickness, mildly calcified leaflets. - Mitral valve: There was mild regurgitation. - Left atrium: The atrium was severely dilated. - Right atrium: The atrium was mildly dilated. - Pulmonic valve: There was mild regurgitation. - Pulmonary arteries: PA peak pressure: 35 mm Hg (S).  Dg Chest 2 View  Result Date: 12/19/2015 CLINICAL DATA:  Patient with chronic shortness of breath. EXAM: CHEST  2 VIEW COMPARISON:  None. FINDINGS: Cardiac contours are  enlarged. Tortuosity of the thoracic aorta. Bilateral mid and lower lung heterogeneous pulmonary opacities. Pulmonary vascular redistribution. Perihilar interstitial opacities. Small bilateral pleural effusions. Thoracic spine degenerative changes. IMPRESSION: Cardiomegaly, pulmonary vascular redistribution and mild bilateral perihilar interstitial opacities favored to represent mild edema. Basilar heterogeneous opacities may represent atelectasis, scarring or infection. Electronically Signed   By: Lovey Newcomer M.D.   On: 12/19/2015 12:37      Subjective: - no chest pain, shortness of breath, no abdominal pain, nausea or vomiting.   Discharge Exam: Vitals:   12/22/15 0454 12/22/15 1059  BP: (!) 143/86 (!) 134/93  Pulse: 65   Resp: 18   Temp: 97.6 F (36.4 C) 98 F (36.7 C)   Vitals:   12/21/15 1320 12/21/15 2032 12/22/15 0454 12/22/15 1059  BP: (!) 148/85 110/81 (!) 143/86 (!) 134/93  Pulse: 85 82 65   Resp: 18 18 18    Temp: 98.1 F (36.7 C) 98 F (36.7 C) 97.6 F (36.4 C) 98 F (36.7 C)  TempSrc: Oral Oral Oral Oral  SpO2: 93% 95% 96% 93%  Weight:   83.2 kg (183 lb 8 oz)   Height:        General: Pt is alert, awake, not  in acute distress Cardiovascular: irregular, no rubs, no gallops Respiratory: CTA bilaterally, no wheezing, no rhonchi Abdominal: Soft, NT, ND, bowel sounds + Extremities: no edema, no cyanosis    The results of significant diagnostics from this hospitalization (including imaging, microbiology, ancillary and laboratory) are listed below for reference.     Microbiology: Recent Results (from the past 240 hour(s))  MRSA PCR Screening     Status: None   Collection Time: 12/19/15  8:07 PM  Result Value Ref Range Status   MRSA by PCR NEGATIVE NEGATIVE Final    Comment:        The GeneXpert MRSA Assay (FDA approved for NASAL specimens only), is one component of a comprehensive MRSA colonization surveillance program. It is not intended to diagnose  MRSA infection nor to guide or monitor treatment for MRSA infections.      Labs: BNP (last 3 results)  Recent Labs  12/19/15 1144  BNP 882.8*   Basic Metabolic Panel:  Recent Labs Lab 12/19/15 1144 12/20/15 0325 12/21/15 0233 12/22/15 0226  NA 143 142 140 142  K 4.4 3.4* 4.3 4.1  CL 110 108 104 108  CO2 23 25 27 27   GLUCOSE 110* 110* 108* 121*  BUN 21* 17 15 19   CREATININE 1.07 1.07 1.15 1.14  CALCIUM 9.7 9.1 9.4 9.0  MG  --  1.8 1.9 2.1   Liver Function Tests:  Recent Labs Lab 12/19/15 1600  AST 35  ALT 55  ALKPHOS 107  BILITOT 2.0*  PROT 6.4*  ALBUMIN 3.8   CBC:  Recent Labs Lab 12/19/15 1144 12/20/15 0325 12/21/15 0233 12/22/15 0226  WBC 7.5 8.8 11.1* 9.2  HGB 14.6 14.8 15.1 14.4  HCT 43.9 42.9 44.8 42.9  MCV 90.5 90.5 90.5 90.3  PLT 234 209 242 237   Cardiac Enzymes:  Recent Labs Lab 12/19/15 1600 12/19/15 2210 12/20/15 0325  TROPONINI 0.03* 0.04* 0.03*   Hgb A1c  Recent Labs  12/19/15 1600  HGBA1C 5.9*   Lipid Profile  Recent Labs  12/20/15 0325  CHOL 130  HDL 27*  LDLCALC 93  TRIG 51  CHOLHDL 4.8   Thyroid function studies  Recent Labs  12/19/15 1600  TSH 0.885   Anemia work up No results for input(s): VITAMINB12, FOLATE, FERRITIN, TIBC, IRON, RETICCTPCT in the last 72 hours. Urinalysis No results found for: COLORURINE, APPEARANCEUR, [place], [place], [place], [place], [place], [place], PROTEINUR, UROBILINOGEN, NITRITE, LEUKOCYTESUR Sepsis Labs Invalid input(s): PROCALCITONIN,  WBC,  LACTICIDVEN Microbiology Recent Results (from the past 240 hour(s))  MRSA PCR Screening     Status: None   Collection Time: 12/19/15  8:07 PM  Result Value Ref Range Status   MRSA by PCR NEGATIVE NEGATIVE Final    Comment:        The GeneXpert MRSA Assay (FDA approved for NASAL specimens only), is one component of a comprehensive MRSA colonization surveillance program. It is not intended to diagnose MRSA infection  nor to guide or monitor treatment for MRSA infections.      Time coordinating discharge: Over 30 minutes  SIGNED:  Marzetta Board, MD  [facility] 12/22/2015, 12:30 PM Pager [phone]  If 7PM-7AM, please contact night-coverage [email or web address] Password [email or web address]

## 2015-12-22 NOTE — Progress Notes (Signed)
Stains of blood were observed on tissue paper after patient voided and cleaned up after using a urinal. On call MD notified urine culture order was received. Will continue to monitor.Call bell within reach.

## 2015-12-22 NOTE — Progress Notes (Signed)
Patient Name: Stephen Copeland Date of Encounter: 12/22/2015  Primary Cardiologist: New - [facility] Problem List     Principal Problem:   New onset a-fib [facility]) Active Problems:   Atrial fibrillation ([place])   Hypertension   Acid reflux   Hyperlipidemia   Acute CHF ([place])   Hyperglycemia   Atrial fibrillation with RVR (HCC)   Uncontrolled hypertension   Pulmonary hypertension   Chronic systolic CHF (congestive heart failure) (HCC)   Acute systolic CHF (congestive heart failure) (HCC)   Congestive dilated cardiomyopathy ([place])     Subjective   Feels well, denies chest pain and palpitations  Inpatient Medications    Scheduled Meds: . apixaban  5 mg Oral BID  . aspirin  81 mg Oral Daily  . atorvastatin  20 mg Oral q1800  . furosemide  20 mg Oral Daily  . lisinopril  5 mg Oral Daily  . metoprolol tartrate  25 mg Oral BID  . potassium chloride  40 mEq Oral Daily   Continuous Infusions: . amiodarone 30 mg/hr (12/22/15 0101)   PRN Meds: acetaminophen **OR** acetaminophen, alum & mag hydroxide-simeth, ondansetron **OR** ondansetron (ZOFRAN) IV, polyethylene glycol, senna-docusate   Vital Signs    Vitals:   12/21/15 0513 12/21/15 1320 12/21/15 2032 12/22/15 0454  BP: (!) 140/96 (!) 148/85 110/81 (!) 143/86  Pulse: (!) 55 85 82 65  Resp: 18 18 18 18   Temp: 98.5 F (36.9 C) 98.1 F (36.7 C) 98 F (36.7 C) 97.6 F (36.4 C)  TempSrc: Oral Oral Oral Oral  SpO2: 90% 93% 95% 96%  Weight: 184 lb 8.4 oz (83.7 kg)   183 lb 8 oz (83.2 kg)  Height:        Intake/Output Summary (Last 24 hours) at 12/22/15 0652 Last data filed at 12/21/15 1700  Gross per 24 hour  Intake              720 ml  Output              400 ml  Net              320 ml   Filed Weights   12/20/15 1917 12/21/15 0513 12/22/15 0454  Weight: 184 lb 9.6 oz (83.7 kg) 184 lb 8.4 oz (83.7 kg) 183 lb 8 oz (83.2 kg)    Physical Exam   GEN: Well nourished, well developed, in no acute distress.   HEENT: Grossly normal.  Neck: Supple, no JVD, carotid bruits, or masses. Cardiac: RRR, no murmurs, rubs, or gallops. No clubbing, cyanosis, edema.  Radials/DP/PT 2+ and equal bilaterally.  Respiratory:  Respirations regular and unlabored, clear to auscultation bilaterally. GI: Soft, nontender, nondistended, BS + x 4. MS: no deformity or atrophy. Skin: warm and dry, no rash. Neuro:  Strength and sensation are intact. Psych: AAOx3.  Normal affect.  Labs    CBC  Recent Labs  12/21/15 0233 12/22/15 0226  WBC 11.1* 9.2  HGB 15.1 14.4  HCT 44.8 42.9  MCV 90.5 90.3  PLT 242 341   Basic Metabolic Panel  Recent Labs  12/21/15 0233 12/22/15 0226  NA 140 142  K 4.3 4.1  CL 104 108  CO2 27 27  GLUCOSE 108* 121*  BUN 15 19  CREATININE 1.15 1.14  CALCIUM 9.4 9.0  MG 1.9 2.1   Liver Function Tests  Recent Labs  12/19/15 1600  AST 35  ALT 55  ALKPHOS 107  BILITOT 2.0*  PROT 6.4*  ALBUMIN 3.8   Cardiac Enzymes  Recent Labs  12/19/15 1600 12/19/15 2210 12/20/15 0325  TROPONINI 0.03* 0.04* 0.03*   Hemoglobin A1C  Recent Labs  12/19/15 1600  HGBA1C 5.9*   Fasting Lipid Panel  Recent Labs  12/20/15 0325  CHOL 130  HDL 27*  LDLCALC 93  TRIG 51  CHOLHDL 4.8   Thyroid Function Tests  Recent Labs  12/19/15 1600  TSH 0.885    Telemetry    Atrial fibrillation - Personally Reviewed  ECG    Atrial fibrillation - Personally Reviewed  Radiology    No results found.  Cardiac Studies  Transthoracic Echocardiography  Study Conclusions  - Left ventricle: The cavity size was normal. There was moderate   concentric hypertrophy. Systolic function was moderately reduced.   The estimated ejection fraction was 40%. Diffuse hypokinesis.   There is severe hypokinesis of the basal-midinferolateral,   inferior, and inferoseptal myocardium. - Ventricular septum: Septal motion showed moderate paradox. These   changes are consistent with  intraventricular conduction delay. - Aortic valve: Poorly visualized. Trileaflet; normal thickness,   mildly calcified leaflets. - Mitral valve: There was mild regurgitation. - Left atrium: The atrium was severely dilated. - Right atrium: The atrium was mildly dilated. - Pulmonic valve: There was mild regurgitation. - Pulmonary arteries: PA peak pressure: 35 mm Hg (S).   Patient Profile  Stephen Copeland is a 75 year old male with a past medical history of HTN, and HLD. He presented to the ED on 12/19/15 with rapid atrial fib with LBBB. Rates not well controlled on Cardizem, started Amiodarone gtt. Plan for TEE/DCCV on Friday.    Assessment & Plan  1. Atrial fib with NGE:XBMW with LVEF=40%. He has no ischemic symptoms. Initially treated with Cardizem but with reduced EF, and rates still elevated, Amiodarone was started with load yesterday.    Will plan TEE guided DCCV on Friday. He is now anti-coagulated on Eliquis 5 mg po BID (he received 2 dose on 12/20/15). CHADSVASC is at least 3 warranting anticoagulation to reduce risk of stroke. Risks and benefits of blood thinners reviewed with patient.   2. Acute systolic CHF/Cardiomyopathy: He diuresed well with IV Lasix. Now with new finding of reduced LV systolic function. Started on beta blocker and ACE-I  3. HTN: BP controlled this am.   4. Hypokalemia:Resolved with replacement  5. Hematuria: Patient had some bleeding from urinary meatus and noticed blood in urine. He is very concerned, from his description does not sound like frank bleeding. Hgb is down to 14.4, from 15.1 yesterday, will watch closely.   Signed, Arbutus Leas, NP  12/22/2015, 6:52 AM   I have personally seen and examined this patient [name], NP. I agree with the assessment and plan as outlined above. He is rate controlled with current therapy. He wants to go home. Will plan to continue metoprolol and will change to po amiodarone (400 mg po BID for one week and then  400 mg once daily). 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 NO ischemic workup planned as an inpatient. Consider outpatient stress test. OK to d/c home today. He can f/u with me in 1-2 weeks. We will arrange.   Lauree Chandler 12/22/2015 9:44 AM

## 2015-12-22 NOTE — Progress Notes (Signed)
Order to discharge received. IV and telemetry removed. CCMD notified. Discharge instructions reviewed with Pt and wife. Questions addressed and they expressed understanding. Pt discharged off floor in care of wife. They planned to take Cab home. Isac Caddy, RN

## 2015-12-23 LAB — URINE CULTURE: Culture: <10000 — AB

## 2015-12-26 ENCOUNTER — Encounter: Payer: Self-pay | Admitting: Cardiology

## 2015-12-28 NOTE — Progress Notes (Signed)
Cardiology Office Note   Date:  12/29/2015   ID:  Stephen Copeland, DOB [birth year], MRN [id number]  PCP:  [facility]  Cardiologist:  Dr. [name]    Chief Complaint  Patient presents with  . Hospitalization Follow-up    no complaints      History of Present Illness: Stephen Copeland is a 75 y.o. male who presents for post hospitalization for atrial fib, acute systolic HF, dilated CM,LBBB and uncontrolled HTN.  A fib rate controlled at discharge.  Pt d/c'd 12/22/15 with plan for DCCV TEE guided. CHADSVASC is at least 3.  For his HF he was diuresed with lasix EF 40%.  BP controlled at discharge.    "Will plan to continue metoprolol and will change to po amiodarone (400 mg po BID for one week and then 400 mg once daily). 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 NO ischemic workup planned as an inpatient. Consider outpatient stress test."  Hematuria  -has resolved.    Today in SR. To SB at 47.  Pt was not aware though he stated he felt more relaxed, no wheezing and edema has improved.  No chest pain.  No SOB. No blood in stools. Pt relieved that he was in SR.  He did not want DCCV.   Past Medical History:  Diagnosis Date  . Acid reflux   . Folliculitis   . Hiatal hernia   . Hyperlipidemia    a. pt is adamantly against statins.  . Hypertension   . Ischemia    a. Pt states he was diagnosed with "ischemia" in the 1990s but does not know further details, denies hx of heart blockage.  . Nummular dermatitis   . Scoliosis   . Sinus bradycardia   . Stomach ulcer    a. remote ulcer in the 1990s, no hx of bleeding.    No past surgical history on file.   Current Outpatient Prescriptions  Medication Sig Dispense Refill  . amiodarone (PACERONE) 400 MG tablet Take 1 tablet (400 mg total) by mouth daily. Twice daily for a week then once daily 90 tablet 1  . apixaban (ELIQUIS) 5 MG TABS tablet Take 1 tablet (5 mg total) by mouth 2 (two) times daily. 60 tablet 1  .  aspirin 81 MG chewable tablet Chew 81 mg by mouth daily.    Marland Kitchen atorvastatin (LIPITOR) 20 MG tablet Take 1 tablet (20 mg total) by mouth daily at 6 PM. 30 tablet 1  . ENSURE PLUS (ENSURE PLUS) LIQD Take 237 mLs by mouth daily.    . furosemide (LASIX) 20 MG tablet Take 1 tablet (20 mg total) by mouth daily. 30 tablet 1  . guaiFENesin-dextromethorphan (ROBITUSSIN DM) 100-10 MG/5ML syrup Take 5 mLs by mouth every 4 (four) hours as needed for cough.    Marland Kitchen lisinopril (PRINIVIL,ZESTRIL) 5 MG tablet Take 1 tablet (5 mg total) by mouth 2 (two) times daily. 60 tablet 11  . metoprolol tartrate (LOPRESSOR) 25 MG tablet Take 1 tablet (25 mg total) by mouth 2 (two) times daily. 60 tablet 1  . nitroGLYCERIN (NITROSTAT) 0.4 MG SL tablet Place 0.4 mg under the tongue every 5 (five) minutes as needed for chest pain.     No current facility-administered medications for this visit.     Allergies:   Review of patient's allergies indicates no known allergies.    Social History:  The patient  reports that he has never smoked. He has never used smokeless tobacco. He reports that  he does not drink alcohol or use drugs.   Family History:  The patient's family history includes Heart disease in his mother; Valvular heart disease in his father.    ROS:  General:no colds or fevers, no weight changes Skin:no rashes or ulcers HEENT:no blurred vision, no congestion CV:see HPI PUL:see HPI GI:no diarrhea constipation or melena, no indigestion GU:no hematuria, no dysuria MS:no joint pain, no claudication Neuro:no syncope, no lightheadedness Endo:no diabetes, no thyroid disease  Wt Readings from Last 3 Encounters:  12/29/15 182 lb 1.9 oz (82.6 kg)  12/22/15 183 lb 8 oz (83.2 kg)     PHYSICAL EXAM: VS:  BP (!) 170/90   Pulse (!) 58   Ht 6' 2.5" (1.892 m)   Wt 182 lb 1.9 oz (82.6 kg)   BMI 23.07 kg/m  , BMI Body mass index is 23.07 kg/m. General:Pleasant affect, NAD Skin:Warm and dry, brisk capillary  refill HEENT:normocephalic, sclera clear, mucus membranes moist Neck:supple, no JVD, no bruits  Heart:S1S2 RRR without murmur, gallup, rub or click Lungs:clear without rales, rhonchi, or wheezes XAJ:OINO, non tender, + BS, do not palpate liver spleen or masses Ext:tr lower ext edema, 2+ pedal pulses, 2+ radial pulses, + varicosities  Neuro:alert and oriented X 3, MAE, follows commands, + facial symmetry    EKG:  EKG is ordered today. The ekg ordered today demonstrates SB with LBBB rate 59.    Recent Labs: 12/19/2015: ALT 55; B Natriuretic Peptide 362.3; TSH 0.885 12/22/2015: BUN 19; Creatinine, Ser 1.14; Hemoglobin 14.4; Magnesium 2.1; Platelets 237; Potassium 4.1; Sodium 142    Lipid Panel    Component Value Date/Time   CHOL 130 12/20/2015 0325   TRIG 51 12/20/2015 0325   HDL 27 (L) 12/20/2015 0325   CHOLHDL 4.8 12/20/2015 0325   VLDL 10 12/20/2015 0325   LDLCALC 93 12/20/2015 0325       Other studies Reviewed: Additional studies/ records that were reviewed today include: . ECHO 12/20/15 Study Conclusions  - Left ventricle: The cavity size was normal. There was moderate   concentric hypertrophy. Systolic function was moderately reduced.   The estimated ejection fraction was 40%. Diffuse hypokinesis.   There is severe hypokinesis of the basal-midinferolateral,   inferior, and inferoseptal myocardium. - Ventricular septum: Septal motion showed moderate paradox. These   changes are consistent with intraventricular conduction delay. - Aortic valve: Poorly visualized. Trileaflet; normal thickness,   mildly calcified leaflets. - Mitral valve: There was mild regurgitation. - Left atrium: The atrium was severely dilated. - Right atrium: The atrium was mildly dilated. - Pulmonic valve: There was mild regurgitation. - Pulmonary arteries: PA peak pressure: 35 mm Hg (S).  ASSESSMENT AND PLAN:  1.  Persistent a fib, now with amiodarone converted to SR SB.  Pt has felt  better.  Plan for lexiscan myoview with FH of CAD and decreased EF.  He will follow up with Dr. [name] post nuc study.   2.  Anticoagulation  CHA2DS2VASc of 3 with LV dysfunction, HTN and age. On xarelto also on ASA will check with Dr. [name] and stop if he agrees.   3.  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 No wheezes edema resolved.   4.  HTN BP is elevated will increase the lisinopril to 5 mg BID.     Current medicines are reviewed with the patient today.  The patient Has no concerns regarding medicines. - pt has meds numbered to keep up with them.   The following  changes have been made:  See above Labs/ tests ordered today include:see above  Disposition:   FU:  see above  Signed, Cecilie Kicks, NP  12/29/2015 10:31 AM    [address], [place], [place], [place] Phone: [phone]; Fax: [phone]

## 2015-12-29 ENCOUNTER — Ambulatory Visit (INDEPENDENT_AMBULATORY_CARE_PROVIDER_SITE_OTHER): Payer: Medicare Other | Admitting: Cardiology

## 2015-12-29 ENCOUNTER — Encounter (INDEPENDENT_AMBULATORY_CARE_PROVIDER_SITE_OTHER): Payer: Self-pay

## 2015-12-29 ENCOUNTER — Encounter: Payer: Self-pay | Admitting: Cardiology

## 2015-12-29 VITALS — BP 170/90 | HR 58 | Ht 74.5 in | Wt 182.1 lb

## 2015-12-29 DIAGNOSIS — I4891 Unspecified atrial fibrillation: Secondary | ICD-10-CM

## 2015-12-29 DIAGNOSIS — R001 Bradycardia, unspecified: Secondary | ICD-10-CM | POA: Diagnosis not present

## 2015-12-29 DIAGNOSIS — R0602 Shortness of breath: Secondary | ICD-10-CM

## 2015-12-29 DIAGNOSIS — I5043 Acute on chronic combined systolic (congestive) and diastolic (congestive) heart failure: Secondary | ICD-10-CM

## 2015-12-29 DIAGNOSIS — I10 Essential (primary) hypertension: Secondary | ICD-10-CM | POA: Diagnosis not present

## 2015-12-29 DIAGNOSIS — I200 Unstable angina: Secondary | ICD-10-CM

## 2015-12-29 MED ORDER — AMIODARONE HCL 400 MG PO TABS: 400.0000 mg | ORAL_TABLET | Freq: Every day | ORAL | 1 refills | Status: DC

## 2015-12-29 MED ORDER — LISINOPRIL 5 MG PO TABS: 5.0000 mg | ORAL_TABLET | Freq: Two times a day (BID) | ORAL | 11 refills | Status: DC

## 2015-12-29 NOTE — Patient Instructions (Addendum)
Medication Instructions:  Your physician has recommended you make the following change in your medication:  1.  REDUCE the Amiodarone to 400 mg taking 1 tablet daily. 2.  INCREASE the Lisinopril to 5 mg taking 1 tablet twice a day  Labwork: IN 4 WEEKS:  TSH & CMET  Testing/Procedures: Your physician has requested that you have a lexiscan myoview. For further information please visit [email or web address]. Please follow instruction sheet, as given.   Follow-Up: Your physician recommends that you schedule a follow-up appointment in: 2 [place], WITH DR. [name]  Any Other Special Instructions Will Be Listed Below (If Applicable).  Pharmacologic Stress Electrocardiogram A pharmacologic stress electrocardiogram is a heart (cardiac) test that uses nuclear imaging to evaluate the blood supply to your heart. This test may also be called a pharmacologic stress electrocardiography. Pharmacologic means that a medicine is used to increase your heart rate and blood pressure.  This stress test is done to find areas of poor blood flow to the heart by determining the extent of coronary artery disease (CAD). Some people exercise on a treadmill, which naturally increases the blood flow to the heart. For those people unable to exercise on a treadmill, a medicine is used. This medicine stimulates your heart and will cause your heart to beat harder and more quickly, as if you were exercising.  Pharmacologic stress tests can help determine:  The adequacy of blood flow to your heart during increased levels of activity in order to clear you for discharge home.  The extent of coronary artery blockage caused by CAD.  Your prognosis if you have suffered a heart attack.  The effectiveness of cardiac procedures done, such as an angioplasty, which can increase the circulation in your coronary arteries.  Causes of chest pain or pressure. LET [facility] CARE PROVIDER KNOW ABOUT:  Any allergies  you have.  All medicines you are taking, including vitamins, herbs, eye drops, creams, and over-the-counter medicines.  Previous problems you or members of your family have had with the use of anesthetics.  Any blood disorders you have.  Previous surgeries you have had.  Medical conditions you have.  Possibility of pregnancy, if this applies.  If you are currently breastfeeding. RISKS AND COMPLICATIONS Generally, this is a safe procedure. However, as with any procedure, complications can occur. Possible complications include:  You develop pain or pressure in the following areas:  Chest.  Jaw or neck.  Between your shoulder blades.  Radiating down your left arm.  Headache.  Dizziness or light-headedness.  Shortness of breath.  Increased or irregular heartbeat.  Low blood pressure.  Nausea or vomiting.  Flushing.  Redness going up the arm and slight pain during injection of medicine.  Heart attack (rare). BEFORE THE PROCEDURE   Avoid all forms of caffeine for 24 hours before your test or as directed by your health care provider. This includes coffee, tea (even decaffeinated tea), caffeinated sodas, chocolate, cocoa, and certain pain medicines.  Follow your health care provider's instructions regarding eating and drinking before the test.  Take your medicines as directed at regular times with water unless instructed otherwise. Exceptions may include:  If you have diabetes, ask how you are to take your insulin or pills. It is common to adjust insulin dosing the morning of the test.  If you are taking beta-blocker medicines, it is important to talk to your health care provider about these medicines well before the date of your test. Taking beta-blocker medicines may interfere  with the test. In some cases, these medicines need to be changed or stopped 24 hours or more before the test.  If you wear a nitroglycerin patch, it may need to be removed prior to the test.  Ask your health care provider if the patch should be removed before the test.  If you use an inhaler for any breathing condition, bring it with you to the test.  If you are an outpatient, bring a snack so you can eat right after the stress phase of the test.  Do not smoke for 4 hours prior to the test or as directed by your health care provider.  Do not apply lotions, powders, creams, or oils on your chest prior to the test.  Wear comfortable shoes and clothing. Let your health care provider know if you were unable to complete or follow the preparations for your test. PROCEDURE   Multiple patches (electrodes) will be put on your chest. If needed, small areas of your chest may be shaved to get better contact with the electrodes. Once the electrodes are attached to your body, multiple wires will be attached to the electrodes, and your heart rate will be monitored.  An IV access will be started. A nuclear trace (isotope) is given. The isotope may be given intravenously, or it may be swallowed. Nuclear refers to several types of radioactive isotopes, and the nuclear isotope lights up the arteries so that the nuclear images are clear. The isotope is absorbed by your body. This results in low radiation exposure.  A resting nuclear image is taken to show how your heart functions at rest.  A medicine is given through the IV access.  A second scan is done about 1 hour after the medicine injection and determines how your heart functions under stress.  During this stress phase, you will be connected to an electrocardiogram machine. Your blood pressure and oxygen levels will be monitored. AFTER THE PROCEDURE   Your heart rate and blood pressure will be monitored after the test.  You may return to your normal schedule, including diet,activities, and medicines, unless your health care provider tells you otherwise.   This information is not intended to replace advice given to you by your health care  provider. Make sure you discuss any questions you have with your health care provider.   Document Released: 07/08/2008 Document Revised: 02/24/2013 Document Reviewed: 10/27/2012 Elsevier Interactive Patient Education [employer].    If you need a refill on your cardiac medications before your next appointment, please call your pharmacy.

## 2015-12-30 ENCOUNTER — Telehealth: Payer: Self-pay | Admitting: *Deleted

## 2015-12-30 NOTE — Telephone Encounter (Signed)
Called pt and advised him that it was ok for him to stop his ASA and pt verbalized understanding.

## 2016-01-09 ENCOUNTER — Telehealth: Payer: Self-pay | Admitting: Cardiology

## 2016-01-09 NOTE — Telephone Encounter (Signed)
Patient had questions in regards to medications.  I reviewed all medications and instructions with patient, per last office note/phone encounter.  He states he is taking medications at 1400 and 1800 daily.  I advised him to separate the "twice daily" medication more, i.e., with breakfast and supper.  I reviewed diet recommendations for low cholesterol, low sodium, and heart healthy foods.  Patient admits drinking Sprite and Dr. Malachi Bonds daily.  I advised him against soda and to drink water.  Pt reports he is [facility] of [facility]" and not keeping digestive clinic appt.  He reports numerous GI issues. I advised him if he chooses to change GI docs to find a new one before leaving the [place].  He voiced understanding and thanks for my time and recommendations.

## 2016-01-09 NOTE — Telephone Encounter (Signed)
New Message:      Please call,when he went to pick up his medicine from the pharmacy,there was a question about one of them.Marland Kitchen

## 2016-01-12 ENCOUNTER — Telehealth (HOSPITAL_COMMUNITY): Payer: Self-pay | Admitting: *Deleted

## 2016-01-12 NOTE — Telephone Encounter (Signed)
Left message on voicemail in reference to upcoming appointment scheduled for 01/17/16. Phone number given for a call back so details instructions can be given.  Stephen Copeland

## 2016-01-12 NOTE — Telephone Encounter (Signed)
Left message on voicemail in reference to upcoming appointment scheduled for 01/17/16. Phone number given for a call back so details instructions can be given. Hubbard Robinson, RN

## 2016-01-13 ENCOUNTER — Telehealth (HOSPITAL_COMMUNITY): Payer: Self-pay | Admitting: Radiology

## 2016-01-13 NOTE — Telephone Encounter (Signed)
Patient given detailed instructions per Myocardial Perfusion Study Information Sheet for the test on 01/17/2016 at 10:00. Patient notified to arrive 15 minutes early and that it is imperative to arrive on time for appointment to keep from having the test rescheduled.  If you need to cancel or reschedule your appointment, please call the office within 24 hours of your appointment. Failure to do so may result in a cancellation of your appointment, and a $50 no show fee. Patient verbalized understanding.EHK

## 2016-01-17 ENCOUNTER — Ambulatory Visit (HOSPITAL_COMMUNITY): Payer: Medicare Other | Attending: Cardiovascular Disease

## 2016-01-17 ENCOUNTER — Telehealth: Payer: Self-pay | Admitting: Cardiovascular Disease

## 2016-01-17 DIAGNOSIS — I4891 Unspecified atrial fibrillation: Secondary | ICD-10-CM | POA: Diagnosis not present

## 2016-01-17 DIAGNOSIS — R0602 Shortness of breath: Secondary | ICD-10-CM | POA: Diagnosis not present

## 2016-01-17 LAB — MYOCARDIAL PERFUSION IMAGING
CHL CUP NUCLEAR SSS: 3
LHR: 0.25
LV sys vol: 130 mL
LVDIAVOL: 199 mL (ref 62–150)
Peak HR: 61 {beats}/min
Rest HR: 45 {beats}/min
SDS: 1
SRS: 2
TID: 0.9

## 2016-01-17 MED ORDER — TECHNETIUM TC 99M TETROFOSMIN IV KIT
31.6000 | PACK | Freq: Once | INTRAVENOUS | Status: AC | PRN
  Administered 2016-01-17: 31.6 via INTRAVENOUS
  Filled 2016-01-17: qty 32

## 2016-01-17 MED ORDER — TECHNETIUM TC 99M TETROFOSMIN IV KIT
10.7000 | PACK | Freq: Once | INTRAVENOUS | Status: AC | PRN
  Administered 2016-01-17: 10.7 via INTRAVENOUS
  Filled 2016-01-17: qty 11

## 2016-01-17 MED ORDER — REGADENOSON 0.4 MG/5ML IV SOLN
0.4000 mg | Freq: Once | INTRAVENOUS | Status: AC
  Administered 2016-01-17: 0.4 mg via INTRAVENOUS

## 2016-01-17 NOTE — Telephone Encounter (Signed)
Walk In Pt Form-Call Patient in reference to BP meds-pat back in office on Wednesday 11/15 will give to her then.

## 2016-01-19 ENCOUNTER — Telehealth: Payer: Self-pay | Admitting: Cardiology

## 2016-01-19 ENCOUNTER — Telehealth: Payer: Self-pay | Admitting: *Deleted

## 2016-01-19 NOTE — Telephone Encounter (Signed)
Called and spoke with the pharmacist at [facility].  Pt was there to have Amiodarone refilled but based on how it is ordered, he shouldn't need a refill yet.  Pt also stating his BP his been elevated.  Advised Amiodarone should be once a day now which is what the pharmacist instructed the patient to do.  Pt does have an appt tomorrow with Ellen Henri at 3:30 pm.  Requested pharmacist to have patient bring all his medications/bottles with him to his appt tomorrow.  She will discuss with patient and was very grateful for the call back.

## 2016-01-19 NOTE — Telephone Encounter (Signed)
I placed call to pt to discuss pt walk in form . Left message to call back.

## 2016-01-19 NOTE — Telephone Encounter (Signed)
New message:    Please call asap,pt is there waiting.[facility] called they need the correct directions for his Amiodarone.

## 2016-01-20 ENCOUNTER — Telehealth: Payer: Self-pay | Admitting: Cardiology

## 2016-01-20 ENCOUNTER — Encounter: Payer: Self-pay | Admitting: *Deleted

## 2016-01-20 ENCOUNTER — Ambulatory Visit (INDEPENDENT_AMBULATORY_CARE_PROVIDER_SITE_OTHER): Payer: Medicare Other | Admitting: Cardiology

## 2016-01-20 ENCOUNTER — Encounter: Payer: Self-pay | Admitting: Cardiology

## 2016-01-20 VITALS — BP 156/94 | HR 54 | Ht 74.0 in | Wt 182.1 lb

## 2016-01-20 DIAGNOSIS — I10 Essential (primary) hypertension: Secondary | ICD-10-CM | POA: Diagnosis not present

## 2016-01-20 DIAGNOSIS — I4891 Unspecified atrial fibrillation: Secondary | ICD-10-CM

## 2016-01-20 DIAGNOSIS — I200 Unstable angina: Secondary | ICD-10-CM

## 2016-01-20 DIAGNOSIS — I420 Dilated cardiomyopathy: Secondary | ICD-10-CM

## 2016-01-20 DIAGNOSIS — Z01812 Encounter for preprocedural laboratory examination: Secondary | ICD-10-CM

## 2016-01-20 DIAGNOSIS — R0602 Shortness of breath: Secondary | ICD-10-CM | POA: Diagnosis not present

## 2016-01-20 LAB — CBC
HEMATOCRIT: 44 % (ref 38.5–50.0)
Hemoglobin: 15 g/dL (ref 13.2–17.1)
MCH: 30.6 pg (ref 27.0–33.0)
MCHC: 34.1 g/dL (ref 32.0–36.0)
MCV: 89.8 fL (ref 80.0–100.0)
MPV: 11.1 fL (ref 7.5–12.5)
Platelets: 223 10*3/uL (ref 140–400)
RBC: 4.9 MIL/uL (ref 4.20–5.80)
RDW: 14.5 % (ref 11.0–15.0)
WBC: 7.6 10*3/uL (ref 3.8–10.8)

## 2016-01-20 LAB — BASIC METABOLIC PANEL
BUN: 19 mg/dL (ref 7–25)
CALCIUM: 9.8 mg/dL (ref 8.6–10.3)
CO2: 29 mmol/L (ref 20–31)
Chloride: 106 mmol/L (ref 98–110)
Creat: 1.21 mg/dL — ABNORMAL HIGH (ref 0.70–1.18)
GLUCOSE: 100 mg/dL — AB (ref 65–99)
Potassium: 3.7 mmol/L (ref 3.5–5.3)
SODIUM: 146 mmol/L (ref 135–146)

## 2016-01-20 LAB — TSH: TSH: 1.5 m[IU]/L (ref 0.40–4.50)

## 2016-01-20 NOTE — Patient Instructions (Signed)
Your physician recommends that you continue on your current medications as directed. Please refer to the Current Medication list given to you today. Your physician recommends that you return for lab work today (BMET, CBC, PT/INR)  Your physician has requested that you have a cardiac catheterization. Cardiac catheterization is used to diagnose and/or treat various heart conditions. Doctors may recommend this procedure for a number of different reasons. The most common reason is to evaluate chest pain. Chest pain can be a symptom of coronary artery disease (CAD), and cardiac catheterization can show whether plaque is narrowing or blocking your heart's arteries. This procedure is also used to evaluate the valves, as well as measure the blood flow and oxygen levels in different parts of your heart. For further information please visit HugeFiesta.[email or web address]. Please follow instruction sheet, as given.

## 2016-01-20 NOTE — Telephone Encounter (Signed)
New message ° °Pt is returning call  ° °Please call back °

## 2016-01-20 NOTE — Telephone Encounter (Signed)
Walk in form stated pt felt BP medications might need to be increased as BP is running high.  I spoke with pt and instructed him to bring any BP readings he has from home to appt with B. Rosita Fire, PA this afternoon.

## 2016-01-20 NOTE — Telephone Encounter (Signed)
See phone noted dated 01/19/16 that addresses this call.

## 2016-01-20 NOTE — Progress Notes (Signed)
01/20/2016 Stephen Copeland   April 14, 1940  [id number]  Primary Physician [facility] Primary Cardiologist: Dr. [name]    Reason for Visit/CC: F/u for Abnormal NST  HPI:  Patient is a 75 year old male with a history of hypertension, hyperlipidemia, new diagnosis of atrial fibrillation with left bundle branch block+ new systolic heart failure. He was recently admitted in October for rapid atrial fibrillation. He required treatment with amiodarone and Eliquis. He converted to NSR. Echo showed reduced systolic function with an estimated ejection fraction of 40%. Subsequently, he was started on a beta blocker and ACE inhibitor. Post hospital discharge, he underwent an outpatient nuclear stress test to assess for ischemia. This was performed in our office on 01/17/2016. This was interpreted as a high risk nuclear stress with inferior and apical thinning. There is a medium defect of moderate severity present in the mid anterior and apical apex. It was reviewed by Dr. [name] who recommended definitive assessment with cardiac catheterization. He now presents to clinic today to discuss possible cardiac catheterization.  He is w/o symptoms. No chest pain, dyspnea or palpitations. He has been fully compliant with meds. VSS.   Current Meds  Medication Sig  . amiodarone (PACERONE) 400 MG tablet Take 1 tablet (400 mg total) by mouth daily. Twice daily for a week then once daily  . apixaban (ELIQUIS) 5 MG TABS tablet Take 1 tablet (5 mg total) by mouth 2 (two) times daily.  Marland Kitchen atorvastatin (LIPITOR) 20 MG tablet Take 1 tablet (20 mg total) by mouth daily at 6 PM.  . ENSURE PLUS (ENSURE PLUS) LIQD Take 237 mLs by mouth daily.  . furosemide (LASIX) 20 MG tablet Take 1 tablet (20 mg total) by mouth daily.  Marland Kitchen guaiFENesin-dextromethorphan (ROBITUSSIN DM) 100-10 MG/5ML syrup Take 5 mLs by mouth every 4 (four) hours as needed for cough.  Marland Kitchen lisinopril (PRINIVIL,ZESTRIL) 5 MG tablet Take 1 tablet (5 mg  total) by mouth 2 (two) times daily.  . metoprolol tartrate (LOPRESSOR) 25 MG tablet Take 1 tablet (25 mg total) by mouth 2 (two) times daily.   No Known Allergies Past Medical History:  Diagnosis Date  . Acid reflux   . Folliculitis   . Hiatal hernia   . Hyperlipidemia    a. pt is adamantly against statins.  . Hypertension   . Ischemia    a. Pt states he was diagnosed with "ischemia" in the 1990s but does not know further details, denies hx of heart blockage.  . Nummular dermatitis   . Scoliosis   . Sinus bradycardia   . Stomach ulcer    a. remote ulcer in the 1990s, no hx of bleeding.   Family History  Problem Relation Age of Onset  . Heart disease Mother     Further details not reported, died at age 9  . Valvular heart disease Father     H/o MV surgery, diet at age 23   No past surgical history on file. Social History   Social History  . Marital status: Married    Spouse name: N/A  . Number of children: N/A  . Years of education: N/A   Occupational History  . Not on file.   Social History Main Topics  . Smoking status: Never Smoker  . Smokeless tobacco: Never Used  . Alcohol use No  . Drug use: No  . Sexual activity: Not on file   Other Topics Concern  . Not on file   Social History Narrative  . No narrative  on file    NST 01/17/16 Study Highlights     Nuclear stress EF: 34%.  Defect 1: There is a medium defect of moderate severity present in the mid inferior and apex location.  This is a high risk study.  The left ventricular ejection fraction is moderately decreased (30-44%).   High risk stress nuclear study with inferior and apical thinning; no ischemia; EF 34 with global hypokinesis and moderate LVE; findings suggestive of NICM; study high risk due to reduced LV function.      Review of Systems: General: negative for chills, fever, night sweats or weight changes.  Cardiovascular: negative for chest pain, dyspnea on exertion, edema,  orthopnea, palpitations, paroxysmal nocturnal dyspnea or shortness of breath Dermatological: negative for rash Respiratory: negative for cough or wheezing Urologic: negative for hematuria Abdominal: negative for nausea, vomiting, diarrhea, bright red blood per rectum, melena, or hematemesis Neurologic: negative for visual changes, syncope, or dizziness All other systems reviewed and are otherwise negative except as noted above.  Physical Exam:  Blood pressure (!) 156/94, pulse (!) 54, height 6\' 2"  (1.88 m), weight 182 lb 1.9 oz (82.6 kg), SpO2 97 %.  General appearance: alert, cooperative and no distress Neck: no carotid bruit and no JVD Lungs: clear to auscultation bilaterally Heart: regular rate and rhythm, S1, S2 normal, no murmur, click, rub or gallop Extremities: extremities normal, atraumatic, no cyanosis or edema Pulses: 2+ and symmetric Skin: Skin color, texture, turgor normal. No rashes or lesions Neurologic: Grossly normal  EKG not performed   ASSESSMENT AND PLAN:   1. PAF: RRR on physical exam. Continue amiodarone and Eliquis.   2. Systolic HF: EF 25% on recent echo. ? Tachy induced from rapid afib +/- coronary ischemia. NST abnormal. Plan for definative LHC. He is euvolemic on physical exam. No dyspnea, LEE, orthopnea or PND. Continue BB, ACE-I and diuretic.   3. Abnormal NST: Defect 1: There is a medium defect of moderate severity present in the mid inferior and apex location. This is a high risk study. Per Dr. [name], he will need NST. We reviewed indication for cath as well as potential risk including death, MI, stroke, bleeding and vascular complications, renal injury and allergic reaction. He understands these risk and agrees to proceed. I've discussed with our pharmacist. He should hold his Eliquis 2 days prior to cath.    Lyda Jester PA-C 01/20/2016 4:04 PM

## 2016-01-21 LAB — PROTIME-INR
INR: 1.2 — ABNORMAL HIGH
Prothrombin Time: 12.6 s — ABNORMAL HIGH (ref 9.0–11.5)

## 2016-01-25 ENCOUNTER — Telehealth: Payer: Self-pay | Admitting: Cardiology

## 2016-01-25 ENCOUNTER — Other Ambulatory Visit: Payer: Medicare Other

## 2016-01-25 NOTE — Telephone Encounter (Signed)
New Message:     Pt is scheduled for Cath on Tuesday,he have some questions.

## 2016-01-25 NOTE — Telephone Encounter (Signed)
15 min phone call. I spoke with the pt and answered all pre-cath questions.

## 2016-01-27 ENCOUNTER — Telehealth: Payer: Self-pay | Admitting: Physician Assistant

## 2016-01-27 NOTE — Telephone Encounter (Signed)
    Patient called about change in color in stool. It has been blackish green for the last couple days. He denies feeling poorly or weakness. He denies any iron supplementation or change in diet. He is on Eliquis and supposed to stop it on Saturday since he is having a cath next Tuesday. I have advised that he call the office on Monday to get a repeat CBC and FOBT to make sure there is no blood in his stool and that his blood counts are stable for cath. Otherwise we will continue current plan and he will call us if he starts to feel poorly or anything gets worse.  [name] PA-C  [facility]

## 2016-01-30 ENCOUNTER — Other Ambulatory Visit: Payer: Medicare Other | Admitting: *Deleted

## 2016-01-30 ENCOUNTER — Telehealth: Payer: Self-pay | Admitting: *Deleted

## 2016-01-30 ENCOUNTER — Other Ambulatory Visit: Payer: Self-pay | Admitting: *Deleted

## 2016-01-30 DIAGNOSIS — I4891 Unspecified atrial fibrillation: Secondary | ICD-10-CM | POA: Diagnosis not present

## 2016-01-30 DIAGNOSIS — R0602 Shortness of breath: Secondary | ICD-10-CM | POA: Diagnosis not present

## 2016-01-30 DIAGNOSIS — I420 Dilated cardiomyopathy: Secondary | ICD-10-CM

## 2016-01-30 DIAGNOSIS — I10 Essential (primary) hypertension: Secondary | ICD-10-CM

## 2016-01-30 DIAGNOSIS — Z01812 Encounter for preprocedural laboratory examination: Secondary | ICD-10-CM | POA: Diagnosis not present

## 2016-01-30 DIAGNOSIS — D649 Anemia, unspecified: Secondary | ICD-10-CM

## 2016-01-30 LAB — COMPREHENSIVE METABOLIC PANEL
ALT: 32 U/L (ref 9–46)
AST: 23 U/L (ref 10–35)
Albumin: 4.2 g/dL (ref 3.6–5.1)
Alkaline Phosphatase: 112 U/L (ref 40–115)
BUN: 19 mg/dL (ref 7–25)
CALCIUM: 9.1 mg/dL (ref 8.6–10.3)
CHLORIDE: 107 mmol/L (ref 98–110)
CO2: 29 mmol/L (ref 20–31)
Creat: 1.15 mg/dL (ref 0.70–1.18)
GLUCOSE: 112 mg/dL — AB (ref 65–99)
POTASSIUM: 3.7 mmol/L (ref 3.5–5.3)
Sodium: 144 mmol/L (ref 135–146)
Total Bilirubin: 1.1 mg/dL (ref 0.2–1.2)
Total Protein: 6.5 g/dL (ref 6.1–8.1)

## 2016-01-30 LAB — CBC WITH DIFFERENTIAL/PLATELET
BASOS ABS: 0 {cells}/uL (ref 0–200)
Basophils Relative: 0 %
EOS ABS: 156 {cells}/uL (ref 15–500)
Eosinophils Relative: 2 %
HEMATOCRIT: 43.5 % (ref 38.5–50.0)
Hemoglobin: 14.4 g/dL (ref 13.2–17.1)
LYMPHS PCT: 18 %
Lymphs Abs: 1404 cells/uL (ref 850–3900)
MCH: 30.1 pg (ref 27.0–33.0)
MCHC: 33.1 g/dL (ref 32.0–36.0)
MCV: 90.8 fL (ref 80.0–100.0)
MONOS PCT: 13 %
MPV: 11 fL (ref 7.5–12.5)
Monocytes Absolute: 1014 cells/uL — ABNORMAL HIGH (ref 200–950)
Neutro Abs: 5226 cells/uL (ref 1500–7800)
Neutrophils Relative %: 67 %
PLATELETS: 229 10*3/uL (ref 140–400)
RBC: 4.79 MIL/uL (ref 4.20–5.80)
RDW: 14.7 % (ref 11.0–15.0)
WBC: 7.8 10*3/uL (ref 3.8–10.8)

## 2016-01-30 LAB — HEPATIC FUNCTION PANEL
ALK PHOS: 111 U/L (ref 40–115)
ALT: 31 U/L (ref 9–46)
AST: 24 U/L (ref 10–35)
Albumin: 4.2 g/dL (ref 3.6–5.1)
BILIRUBIN INDIRECT: 0.8 mg/dL (ref 0.2–1.2)
Bilirubin, Direct: 0.3 mg/dL — ABNORMAL HIGH (ref <=0.2)
TOTAL PROTEIN: 6.5 g/dL (ref 6.1–8.1)
Total Bilirubin: 1.1 mg/dL (ref 0.2–1.2)

## 2016-01-30 NOTE — Addendum Note (Signed)
Addended by: Eulis Foster on: 01/30/2016 11:28 AM   Modules accepted: Orders

## 2016-01-30 NOTE — Telephone Encounter (Signed)
I spoke with pt and gave him information from Dr. [name].  I asked him to bring any blood pressure readings to his appt.

## 2016-01-30 NOTE — Telephone Encounter (Signed)
CBC results reviewed with B. Simmons, PA and instructions given for pt to resume Eliquis and stop ASA.  Follow up with Dr. [name] as planned on 12/6. I spoke with pt and gave him these instructions. He reports he has not been taking ASA. Pt is asking about blood pressure. He does not check at home but when he checked at [facility] on 01/13/16 it was 164/85. No other readings available  He reports it was also elevated during nuclear study.  BP at last office visit was 156/94.  Pt is taking Lisinopril and lopressor as listed.  He is asking if medications need to be adjusted.

## 2016-01-30 NOTE — Telephone Encounter (Signed)
We can discuss this at his office visit. cdm

## 2016-01-30 NOTE — Telephone Encounter (Signed)
I spoke with pt when he was in office for precath labs today.  Occult blood test can be done in our office and stool card was given to pt when labs drawn. Pt aware to return this to our office. Case reviewed with B.Rosita Fire, [place].  Cath was planned due to stress test results but pt was not having chest pain.  When I spoke with pt he reports occasional chest pressure but he does not remember when he last had. He complains of "belly pain" for last 4 weeks and has been followed for digestive problems.   No complaints of belly pain today.  He stopped Eliquis after morning dose this past Saturday.  States stool has been greenish black for one week.  Two to three stools daily.   Per B.Rosita Fire, [place] cath will need to be cancelled for tomorrow.  Pt should remain off Eliquis until CBC results available today.  I spoke with pt and gave him this information.  Pt should keep scheduled appointment with Dr. [name] on 02/08/16.  Pt made aware of appt date and time. CBC to be run STAT. Cath lab notified to cancel cath.

## 2016-01-30 NOTE — Telephone Encounter (Signed)
I placed call to pt to schedule STAT CBC for today.  Left message to call back. Our office does not do lab test for fecal occult blood.  Can be done at [facility] but card is sent home with patient to be mailed in.

## 2016-01-30 NOTE — Telephone Encounter (Signed)
Agree Thanks, 

## 2016-01-30 NOTE — Telephone Encounter (Signed)
I tried again to reach pt. Left message to call back.  No other number listed for pt or his contacts.

## 2016-01-31 ENCOUNTER — Ambulatory Visit (HOSPITAL_COMMUNITY)
Admission: RE | Admit: 2016-01-31 | Payer: Medicare Other | Source: Ambulatory Visit | Admitting: Interventional Cardiology

## 2016-01-31 ENCOUNTER — Other Ambulatory Visit: Payer: Self-pay | Admitting: Physician Assistant

## 2016-01-31 ENCOUNTER — Encounter (HOSPITAL_COMMUNITY): Admission: RE | Payer: Self-pay | Source: Ambulatory Visit

## 2016-01-31 SURGERY — LEFT HEART CATH AND CORONARY ANGIOGRAPHY
Anesthesia: LOCAL

## 2016-02-03 LAB — FECAL OCCULT BLOOD, IMMUNOCHEMICAL

## 2016-02-06 ENCOUNTER — Other Ambulatory Visit: Payer: Self-pay | Admitting: *Deleted

## 2016-02-06 ENCOUNTER — Telehealth: Payer: Self-pay | Admitting: Cardiovascular Disease

## 2016-02-06 MED ORDER — LISINOPRIL 5 MG PO TABS: 5.0000 mg | ORAL_TABLET | Freq: Two times a day (BID) | ORAL | 3 refills | Status: DC

## 2016-02-06 NOTE — Telephone Encounter (Signed)
I spoke with pt and he will bring BP readings to appt on 12/6.He reports stool is now back to normal.

## 2016-02-06 NOTE — Telephone Encounter (Signed)
Left message to call back  

## 2016-02-06 NOTE — Telephone Encounter (Signed)
Patient stated that his BP is still elevated. He would like to know if his dose needs to be adjusted. Patient has refills at [facility] and he requested earlier today to have an rx sent to his mail order pharmacy which I have already done. Please advise. Thanks, MI

## 2016-02-06 NOTE — Telephone Encounter (Signed)
°*  STAT* If patient is at the pharmacy, call can be transferred to refill team.   1. Which medications need to be refilled? (please list name of each medication and dose if known) Lisinopril ( pt wants rn to call  Him; do the dosage needs to be increased? )  2. Which pharmacy/location (including street and city if local pharmacy) is medication to be sent to? He wants to pick up some from the office or the [place] that is local until his mail in order comes   3. Do they need a 30 day or 90 day supply? 90 days

## 2016-02-07 NOTE — Progress Notes (Signed)
Chief Complaint  Patient presents with  . Follow-up   History of Present Illness: 75 yo male with history of HTN, HLD, sinus bradycardia, hiatal hernia, GERD, atrial fibrillation with LBBB who is here today for cardiac follow up. He was admitted to [facility] in October 2017 with rapid atrial fibrillation. He was started on amiodarone and Eliquis and converted to NSR. Echo with LVEF=40%. Outpatient stress test was a high risk study with inferior and apical thinning with a perfusion defect in the mid anterior wall and apex. We had planned a cardiac cath but he called the office and c/o blackish green stools. Repeat H/H stable. No red blood. Cath was postponed.   He is here today for follow up to discuss potential cath. He tells me that he has had a cough productive of clear sputum but mostly a dry cough. No fever or chills. No chest pain or dyspnea. Stools seem to be normal color now.   Primary Care Physician: [facility]  Past Medical History:  Diagnosis Date  . Acid reflux   . Folliculitis   . Hiatal hernia   . Hyperlipidemia    a. pt is adamantly against statins.  . Hypertension   . Ischemia    a. Pt states he was diagnosed with "ischemia" in the 1990s but does not know further details, denies hx of heart blockage.  . Nummular dermatitis   . Scoliosis   . Sinus bradycardia   . Stomach ulcer    a. remote ulcer in the 1990s, no hx of bleeding.    No past surgical history on file.  Current Outpatient Prescriptions  Medication Sig Dispense Refill  . amiodarone (PACERONE) 400 MG tablet Take 1 tablet (400 mg total) by mouth daily. Twice daily for a week then once daily 90 tablet 1  . apixaban (ELIQUIS) 5 MG TABS tablet Take 1 tablet (5 mg total) by mouth 2 (two) times daily. 60 tablet 1  . atorvastatin (LIPITOR) 20 MG tablet Take 1 tablet (20 mg total) by mouth daily at 6 PM. 30 tablet 1  . ENSURE PLUS (ENSURE PLUS) LIQD Take 237 mLs by mouth daily.    . furosemide (LASIX) 20  MG tablet Take 1 tablet (20 mg total) by mouth daily. 30 tablet 1  . lisinopril (PRINIVIL,ZESTRIL) 5 MG tablet Take 1 tablet (5 mg total) by mouth 2 (two) times daily. 180 tablet 3  . metoprolol tartrate (LOPRESSOR) 25 MG tablet Take 1 tablet (25 mg total) by mouth 2 (two) times daily. 60 tablet 1  . nitroGLYCERIN (NITROSTAT) 0.4 MG SL tablet Place 1 tablet (0.4 mg total) under the tongue every 5 (five) minutes as needed for chest pain. 75 tablet 3   No current facility-administered medications for this visit.     No Known Allergies  Social History   Social History  . Marital status: Married    Spouse name: N/A  . Number of children: N/A  . Years of education: N/A   Occupational History  . Not on file.   Social History Main Topics  . Smoking status: Never Smoker  . Smokeless tobacco: Never Used  . Alcohol use No  . Drug use: No  . Sexual activity: Not on file   Other Topics Concern  . Not on file   Social History Narrative  . No narrative on file    Family History  Problem Relation Age of Onset  . Heart disease Mother     Further details not  reported, died at age 9  . Valvular heart disease Father     H/o MV surgery, diet at age 21    Review of Systems:  As stated in the HPI and otherwise negative.   BP 130/70   Pulse (!) 56   Ht 6\' 2"  (1.88 m)   Wt 185 lb (83.9 kg)   BMI 23.75 kg/m   Physical Examination: General: Well developed, well nourished, NAD  HEENT: OP clear, mucus membranes moist  SKIN: warm, dry. No rashes. Neuro: No focal deficits  Musculoskeletal: Muscle strength 5/5 all ext  Psychiatric: Mood and affect normal  Neck: No JVD, no carotid bruits, no thyromegaly, no lymphadenopathy.  Lungs:Clear bilaterally, no wheezes, rhonci, crackles Cardiovascular: Regular rate and rhythm. No murmurs, gallops or rubs. Abdomen:Soft. Bowel sounds present. Non-tender.  Extremities: No lower extremity edema. Pulses are 2 + in the bilateral DP/PT.  Echo  12/20/15: Left ventricle: The cavity size was normal. There was moderate   concentric hypertrophy. Systolic function was moderately reduced.   The estimated ejection fraction was 40%. Diffuse hypokinesis.   There is severe hypokinesis of the basal-midinferolateral,   inferior, and inferoseptal myocardium. - Ventricular septum: Septal motion showed moderate paradox. These   changes are consistent with intraventricular conduction delay. - Aortic valve: Poorly visualized. Trileaflet; normal thickness,   mildly calcified leaflets. - Mitral valve: There was mild regurgitation. - Left atrium: The atrium was severely dilated. - Right atrium: The atrium was mildly dilated. - Pulmonic valve: There was mild regurgitation. - Pulmonary arteries: PA peak pressure: 35 mm Hg (S).  EKG:  EKG is not ordered today. The ekg ordered today demonstrates   Recent Labs: 12/19/2015: B Natriuretic Peptide 362.3 12/22/2015: Magnesium 2.1 01/20/2016: TSH 1.50 01/30/2016: ALT 32; ALT 31; BUN 19; Creat 1.15; Hemoglobin 14.4; Platelets 229; Potassium 3.7; Sodium 144   Lipid Panel    Component Value Date/Time   CHOL 130 12/20/2015 0325   TRIG 51 12/20/2015 0325   HDL 27 (L) 12/20/2015 0325   CHOLHDL 4.8 12/20/2015 0325   VLDL 10 12/20/2015 0325   LDLCALC 93 12/20/2015 0325     Wt Readings from Last 3 Encounters:  02/08/16 185 lb (83.9 kg)  01/20/16 182 lb 1.9 oz (82.6 kg)  01/17/16 182 lb (82.6 kg)     Other studies Reviewed: Additional studies/ records that were reviewed today include: . Review of the above records demonstrates:   Assessment and Plan:   1. Abnormal nuclear stress test: We have discussed arranging a cardiac cath. He is having no chest pain or dyspnea. He is feeling great. He has had a recent cough and that along with change in stool color last week, will delay cath for several months unless he develops symptoms concerning for angina.   2. Chronic systolic CHF/cardiomyopathy:  Unclear if his cardiomyopathy is due to rapid atrial fib or underlying CAD vs other. Stress test is abnormal. Will plan cardiac cath to define CAD but will delay for several months until he recovers from his respiratory illness. . Volume status is ok.   3. Atrial fib, paroxysmal: He is in sinus today. Will continue metoprolol, amiodarone for now. Will continue Eliquis.   Current medicines are reviewed at length with the patient today.  The patient does not have concerns regarding medicines.  The following changes have been made:  no change  Labs/ tests ordered today include:  No orders of the defined types were placed in this encounter.    Disposition:  FU with me in 3 months   Signed, Lauree Chandler, MD 02/08/2016 12:32 PM    [facility] [address], [place], [place]  [address] Phone: [phone]; Fax: [phone]

## 2016-02-08 ENCOUNTER — Ambulatory Visit (INDEPENDENT_AMBULATORY_CARE_PROVIDER_SITE_OTHER): Payer: Medicare Other | Admitting: Cardiovascular Disease

## 2016-02-08 ENCOUNTER — Encounter: Payer: Self-pay | Admitting: Cardiovascular Disease

## 2016-02-08 VITALS — BP 130/70 | HR 56 | Ht 74.0 in | Wt 185.0 lb

## 2016-02-08 DIAGNOSIS — I480 Paroxysmal atrial fibrillation: Secondary | ICD-10-CM

## 2016-02-08 DIAGNOSIS — I420 Dilated cardiomyopathy: Secondary | ICD-10-CM | POA: Diagnosis not present

## 2016-02-08 DIAGNOSIS — I200 Unstable angina: Secondary | ICD-10-CM | POA: Diagnosis not present

## 2016-02-08 DIAGNOSIS — I5022 Chronic systolic (congestive) heart failure: Secondary | ICD-10-CM | POA: Diagnosis not present

## 2016-02-08 DIAGNOSIS — R9439 Abnormal result of other cardiovascular function study: Secondary | ICD-10-CM | POA: Diagnosis not present

## 2016-02-08 MED ORDER — NITROGLYCERIN 0.4 MG SL SUBL: 0.4000 mg | SUBLINGUAL_TABLET | SUBLINGUAL | 3 refills | Status: DC | PRN

## 2016-02-08 NOTE — Patient Instructions (Addendum)
Medication Instructions:  Your physician recommends that you continue on your current medications as directed. Please refer to the Current Medication list given to you today.   Labwork: none  Testing/Procedures: none  Follow-Up: You are scheduled to see Dr. [name] on March 15,2018 at 9:15  Any Other Special Instructions Will Be Listed Below (If Applicable).     If you need a refill on your cardiac medications before your next appointment, please call your pharmacy.

## 2016-02-20 ENCOUNTER — Other Ambulatory Visit: Payer: Self-pay | Admitting: *Deleted

## 2016-02-20 MED ORDER — NITROGLYCERIN 0.4 MG SL SUBL: 0.4000 mg | SUBLINGUAL_TABLET | SUBLINGUAL | 1 refills | Status: DC | PRN

## 2016-02-20 MED ORDER — FUROSEMIDE 20 MG PO TABS: 20.0000 mg | ORAL_TABLET | Freq: Every day | ORAL | 3 refills | Status: DC

## 2016-02-20 MED ORDER — FUROSEMIDE 20 MG PO TABS: 20.0000 mg | ORAL_TABLET | Freq: Every day | ORAL | 0 refills | Status: DC

## 2016-02-20 MED ORDER — METOPROLOL TARTRATE 25 MG PO TABS: 25.0000 mg | ORAL_TABLET | Freq: Two times a day (BID) | ORAL | 0 refills | Status: DC

## 2016-02-20 MED ORDER — APIXABAN 5 MG PO TABS: 5.0000 mg | ORAL_TABLET | Freq: Two times a day (BID) | ORAL | 0 refills | Status: DC

## 2016-02-20 MED ORDER — ATORVASTATIN CALCIUM 20 MG PO TABS: 20.0000 mg | ORAL_TABLET | Freq: Every day | ORAL | 3 refills | Status: DC

## 2016-02-20 MED ORDER — ATORVASTATIN CALCIUM 20 MG PO TABS: 20.0000 mg | ORAL_TABLET | Freq: Every day | ORAL | 0 refills | Status: DC

## 2016-02-20 MED ORDER — METOPROLOL TARTRATE 25 MG PO TABS: 25.0000 mg | ORAL_TABLET | Freq: Two times a day (BID) | ORAL | 3 refills | Status: DC

## 2016-02-20 MED ORDER — APIXABAN 5 MG PO TABS: 5.0000 mg | ORAL_TABLET | Freq: Two times a day (BID) | ORAL | 3 refills | Status: DC

## 2016-02-20 NOTE — Addendum Note (Signed)
Addended by: Roberts Gaudy on: 02/20/2016 11:44 AM   Modules accepted: Orders

## 2016-03-01 ENCOUNTER — Other Ambulatory Visit: Payer: Self-pay | Admitting: *Deleted

## 2016-03-01 ENCOUNTER — Telehealth: Payer: Self-pay | Admitting: *Deleted

## 2016-03-01 MED ORDER — ATORVASTATIN CALCIUM 20 MG PO TABS: 20.0000 mg | ORAL_TABLET | Freq: Every day | ORAL | 3 refills | Status: DC

## 2016-03-01 MED ORDER — APIXABAN 5 MG PO TABS: 5.0000 mg | ORAL_TABLET | Freq: Two times a day (BID) | ORAL | 3 refills | Status: DC

## 2016-03-01 MED ORDER — METOPROLOL TARTRATE 25 MG PO TABS: 25.0000 mg | ORAL_TABLET | Freq: Two times a day (BID) | ORAL | 3 refills | Status: DC

## 2016-03-01 MED ORDER — FUROSEMIDE 20 MG PO TABS: 20.0000 mg | ORAL_TABLET | Freq: Every day | ORAL | 3 refills | Status: DC

## 2016-03-01 NOTE — Telephone Encounter (Signed)
CALLED [facility] AND HAD THEM GO AHEAD AND GET MEDICATION READY AND SHIPPED TO PT, SPOKE WITH TARYN. SHE TRANSFERRED ME TO [facility] WHOM CONFIRMED RECEIPT OF MEDICATION:  ELIQUIS, ATORVASTATIN, LASIX, & METOPROLOL. THE MEDICATIONS ARE BEING PROCESSED AND WILL SHIP AS SOON AS THEY ARE THROUGH THE SYSTEM.

## 2016-04-18 ENCOUNTER — Telehealth: Payer: Self-pay | Admitting: Cardiovascular Disease

## 2016-04-18 ENCOUNTER — Encounter: Payer: Self-pay | Admitting: *Deleted

## 2016-04-18 NOTE — Telephone Encounter (Signed)
[facility] is calling to request a written clearance note for the pt to have routine cleaning done, while being on Eliquis.   Student and Instructor state they cannot accept verbal clearance for the pt to have his teeth cleaned while taking Eliquis.  Both state this has to be written out by the Cardiologist.  Per Anderson Malta, a Dental Hygiene Student at [facility] [employer] [facility], they will need for Dr [name] to write a clearance note about this and have this faxed to them at [phone] and telephone number [phone] Attention to :  [facility].    Informed Instructor and Student that both Dr [name] and RN are out of the office today, but I will route this message to them both for further review, recommendation, and follow-up accordingly thereafter.   Informed the Student and the pt that if Dr [name] responds to this message before he returns to the office, a triage Nurse will fax his recommendations to their information provided, promptly thereafter.   Both parties verbalized understanding and agrees with this plan.  Student will reschedule the pt in the meantime.

## 2016-04-18 NOTE — Telephone Encounter (Signed)
Will place this clearance note, per Dr [name], in the nurses fax box to be sent tomorrow to [facility].  Hygiene clinic to follow-up with the pt to arrange cleaning, once this requested fax is received.  Pt aware that this was addressed and will be faxed tomorrow.  Pt verbalized understanding and gracious for all the assistance provided.

## 2016-04-18 NOTE — Telephone Encounter (Signed)
It is ok for him to have his teeth cleaned while on Eliquis.   Stephen Copeland 04/18/2016 3:59 PM

## 2016-04-18 NOTE — Telephone Encounter (Signed)
Patient is currently at the dentist office and is about to have teeth cleaned. The dentist office has a few questions about his Eliquis medication. thanks

## 2016-04-19 ENCOUNTER — Telehealth: Payer: Self-pay | Admitting: Cardiovascular Disease

## 2016-04-19 NOTE — Telephone Encounter (Signed)
Stephen Blanks, MD      04/18/16 3:59 PM  Note    It is ok for him to have his teeth cleaned while on Eliquis.   Lauree Chandler

## 2016-04-19 NOTE — Telephone Encounter (Signed)
New Message    Do not send fax for [facility] for dental cleaning, he wants to wait and talk to a regular dentist. Please call

## 2016-04-19 NOTE — Telephone Encounter (Signed)
Request for surgical clearance:  1. What type of surgery is being performed? Teeth cleaning   2. When is this surgery scheduled? 04-24-16   3. Are there any medications that need to be held prior to surgery and how long?Can stop his blood thinner(Eliquis)? If so,when and for how long?   4. Name of physician performing surgery? Dr Primitivo Gauze Bray,DDS   5. What is your office phone and fax number? [phone] and fax is [phone]

## 2016-04-19 NOTE — Telephone Encounter (Signed)
Spoke with pt and he asked that we not fax letter to [facility].  He has decided to just see a regular dentist.  Advised pt if dentist needs anything from [place] to just let us know.  Pt appreciative for assistance.

## 2016-04-20 ENCOUNTER — Ambulatory Visit: Payer: Medicare Other

## 2016-04-20 ENCOUNTER — Telehealth: Payer: Self-pay | Admitting: Cardiovascular Disease

## 2016-04-20 DIAGNOSIS — R3914 Feeling of incomplete bladder emptying: Secondary | ICD-10-CM | POA: Diagnosis not present

## 2016-04-20 DIAGNOSIS — N401 Enlarged prostate with lower urinary tract symptoms: Secondary | ICD-10-CM | POA: Diagnosis not present

## 2016-04-20 DIAGNOSIS — R350 Frequency of micturition: Secondary | ICD-10-CM | POA: Diagnosis not present

## 2016-04-20 DIAGNOSIS — R310 Gross hematuria: Secondary | ICD-10-CM | POA: Diagnosis not present

## 2016-04-20 NOTE — Telephone Encounter (Signed)
Left message for patient to call back  

## 2016-04-20 NOTE — Telephone Encounter (Signed)
New message      Pt c/o medication issue:  1. Name of Medication: eliquis 2. How are you currently taking this medication (dosage and times per day)?  5mg   3. Are you having a reaction (difficulty breathing--STAT)?  no 4. What is your medication issue?  Pt states this am he has "lots of blood" in his urine.  The toilet is "full" of blood. Please advise

## 2016-04-20 NOTE — Telephone Encounter (Signed)
Patient is calling and complaining of a significant amount of blood in his urine. He states that this started at 6 am this morning and that he is having some bleeding even when he is not urinating. The patient denies bleeding from any other area or any bloody stools. The patient has made an appointment with his PCP this afternoon. The patient takes 5 mg eliquis BID. Patient was advised to  hold morning eliquis dose and be seen with his PCP or be seen in the ER. Patient is wanted Dr. [name] to call the hospital to do a "direct admit" for him. I advised the patient that Dr. [name] would not do a direct admit for him since he is not in the office and he is not having cardiac symptoms that he could go directly over to the ER to be seen now or he could be seen by his PCP and they could do possibly do a direct admit from their office. Patient was advised to be seen in the ER if his bleeding was that significant. The patient states that he does not want to go to the ER on his own, that he was going to wait for his PCP appointment. Patient was advised to hold his eliquis until he sees a provider whether it is his PCP or someone in the ER. Patient verbalized understanding.

## 2016-04-26 ENCOUNTER — Telehealth: Payer: Self-pay | Admitting: Cardiovascular Disease

## 2016-04-26 NOTE — Telephone Encounter (Signed)
I spoke with pt who reports he received 3 bottles of NTG from mail order.  I explained to him this was a 3 month supply.  Pt reports he also has a previously filled one month supply of one bottle.

## 2016-04-26 NOTE — Telephone Encounter (Signed)
New Message  Pt c/o medication issue:  1. Name of Medication: Flomax  2. How are you currently taking this medication (dosage and times per day)? .4mg    3. Are you having a reaction (difficulty breathing--STAT)? Blood in urine   4. What is your medication issue? Per pt states he started seeing a Urology for blood in urine. Pt states Dr. Leitha Schuller prescribed him this medication. Pt states he still is seeing the blood. Pt also states the Urologist took him off eliquis. Pt states he seen on tv if blood appears to stop the eliquis. Pt would like to discuss. Please call back

## 2016-04-26 NOTE — Telephone Encounter (Signed)
New message  Pt call requesting to speak with RN. Pt states he received 3 bottles of Nitroglycerin from his pharmacy and wanted to asked the refills before call the pharmacy. Please call back to discuss

## 2016-04-26 NOTE — Telephone Encounter (Signed)
I spoke with pt. He is being followed by Dr. Karsten Ro for bleeding in his urine. States he has a problem with his urethra. Pt reports he is continuing to have some blood in his urine.  I instructed pt to follow up with Dr. Karsten Ro for this.  Pt reports he has been taken off Eliquis. Started on Flomax.   Pt is not having any chest pain or shortness of breath. He is seeing Dr. [name] on 3/15 for 3 month follow up.

## 2016-05-03 DIAGNOSIS — R8271 Bacteriuria: Secondary | ICD-10-CM | POA: Diagnosis not present

## 2016-05-03 DIAGNOSIS — N401 Enlarged prostate with lower urinary tract symptoms: Secondary | ICD-10-CM | POA: Diagnosis not present

## 2016-05-03 DIAGNOSIS — R310 Gross hematuria: Secondary | ICD-10-CM | POA: Diagnosis not present

## 2016-05-17 ENCOUNTER — Ambulatory Visit (INDEPENDENT_AMBULATORY_CARE_PROVIDER_SITE_OTHER): Payer: Medicare Other | Admitting: Cardiovascular Disease

## 2016-05-17 ENCOUNTER — Encounter (INDEPENDENT_AMBULATORY_CARE_PROVIDER_SITE_OTHER): Payer: Self-pay

## 2016-05-17 ENCOUNTER — Encounter: Payer: Self-pay | Admitting: Cardiovascular Disease

## 2016-05-17 VITALS — BP 160/84 | HR 62 | Ht 74.5 in | Wt 183.2 lb

## 2016-05-17 DIAGNOSIS — I5022 Chronic systolic (congestive) heart failure: Secondary | ICD-10-CM

## 2016-05-17 DIAGNOSIS — I480 Paroxysmal atrial fibrillation: Secondary | ICD-10-CM | POA: Diagnosis not present

## 2016-05-17 DIAGNOSIS — R9439 Abnormal result of other cardiovascular function study: Secondary | ICD-10-CM | POA: Diagnosis not present

## 2016-05-17 MED ORDER — ATORVASTATIN CALCIUM 20 MG PO TABS: 20.0000 mg | ORAL_TABLET | Freq: Every day | ORAL | 3 refills | Status: DC

## 2016-05-17 MED ORDER — AMIODARONE HCL 200 MG PO TABS: 200.0000 mg | ORAL_TABLET | Freq: Every day | ORAL | 3 refills | Status: DC

## 2016-05-17 MED ORDER — LISINOPRIL 5 MG PO TABS: 5.0000 mg | ORAL_TABLET | Freq: Two times a day (BID) | ORAL | 3 refills | Status: DC

## 2016-05-17 MED ORDER — FUROSEMIDE 20 MG PO TABS: 20.0000 mg | ORAL_TABLET | Freq: Every day | ORAL | 3 refills | Status: DC

## 2016-05-17 MED ORDER — METOPROLOL TARTRATE 25 MG PO TABS: 25.0000 mg | ORAL_TABLET | Freq: Two times a day (BID) | ORAL | 3 refills | Status: DC

## 2016-05-17 NOTE — Progress Notes (Signed)
Chief Complaint  Patient presents with  . Congestive dilated cardiomyopathy    follow up   History of Present Illness: 76 yo male with history of HTN, HLD, sinus bradycardia, hiatal hernia, GERD, atrial fibrillation with LBBB who is here today for cardiac follow up. He was admitted to [facility] in October 2017 with rapid atrial fibrillation. He was started on amiodarone and Eliquis and converted to NSR. Echo with LVEF=40%. Outpatient stress test November 2017 was a high risk study with inferior and apical thinning with a perfusion defect in the mid anterior wall and apex. We had planned a cardiac cath but he c/o blackish green stools, cough and had no chest pain so cath was delayed.  He began having blood in his urine in February 2018 and was seen in Urology. Eliquis held for several days. He was started on Flomax. His hematuria resolved.   He is here today for follow up. He feels great. No chest pain or dyspnea. No near syncope or syncope. LE edema is improved on lasix. He is active.   Primary Care Physician: No PCP Per Patient  Past Medical History:  Diagnosis Date  . Acid reflux   . Folliculitis   . Hiatal hernia   . Hyperlipidemia    a. pt is adamantly against statins.  . Hypertension   . Ischemia    a. Pt states he was diagnosed with "ischemia" in the 1990s but does not know further details, denies hx of heart blockage.  . Nummular dermatitis   . Scoliosis   . Sinus bradycardia   . Stomach ulcer    a. remote ulcer in the 1990s, no hx of bleeding.    No past surgical history on file.  Current Outpatient Prescriptions  Medication Sig Dispense Refill  . apixaban (ELIQUIS) 5 MG TABS tablet Take 1 tablet (5 mg total) by mouth 2 (two) times daily. 180 tablet 3  . atorvastatin (LIPITOR) 20 MG tablet Take 1 tablet (20 mg total) by mouth daily at 6 PM. 90 tablet 3  . ENSURE PLUS (ENSURE PLUS) LIQD Take 237 mLs by mouth daily.    . furosemide (LASIX) 20 MG tablet Take 1 tablet (20 mg  total) by mouth daily. 90 tablet 3  . lisinopril (PRINIVIL,ZESTRIL) 5 MG tablet Take 1 tablet (5 mg total) by mouth 2 (two) times daily. 180 tablet 3  . metoprolol tartrate (LOPRESSOR) 25 MG tablet Take 1 tablet (25 mg total) by mouth 2 (two) times daily. 180 tablet 3  . nitroGLYCERIN (NITROSTAT) 0.4 MG SL tablet Place 1 tablet (0.4 mg total) under the tongue every 5 (five) minutes as needed for chest pain. 25 tablet 1  . tamsulosin (FLOMAX) 0.4 MG CAPS capsule Take 0.4 mg by mouth daily.    Marland Kitchen amiodarone (PACERONE) 200 MG tablet Take 1 tablet (200 mg total) by mouth daily. 90 tablet 3   No current facility-administered medications for this visit.     No Known Allergies  Social History   Social History  . Marital status: Married    Spouse name: N/A  . Number of children: N/A  . Years of education: N/A   Occupational History  . Not on file.   Social History Main Topics  . Smoking status: Never Smoker  . Smokeless tobacco: Never Used  . Alcohol use No  . Drug use: No  . Sexual activity: Not on file   Other Topics Concern  . Not on file   Social History Narrative  .  No narrative on file    Family History  Problem Relation Age of Onset  . Heart disease Mother     Further details not reported, died at age 46  . Valvular heart disease Father     H/o MV surgery, diet at age 5    Review of Systems:  As stated in the HPI and otherwise negative.   BP (!) 160/84   Pulse 62   Ht 6' 2.5" (1.892 m)   Wt 183 lb 3.2 oz (83.1 kg)   BMI 23.21 kg/m   Physical Examination: General: Well developed, well nourished, NAD  HEENT: OP clear, mucus membranes moist  SKIN: warm, dry. No rashes. Neuro: No focal deficits  Musculoskeletal: Muscle strength 5/5 all ext  Psychiatric: Mood and affect normal  Neck: No JVD, no carotid bruits, no thyromegaly, no lymphadenopathy.  Lungs:Clear bilaterally, no wheezes, rhonci, crackles Cardiovascular: Regular rate and rhythm. No murmurs, gallops  or rubs. Abdomen:Soft. Bowel sounds present. Non-tender.  Extremities: No lower extremity edema. Pulses are 2 + in the bilateral DP/PT.  Echo 12/20/15: Left ventricle: The cavity size was normal. There was moderate   concentric hypertrophy. Systolic function was moderately reduced.   The estimated ejection fraction was 40%. Diffuse hypokinesis.   There is severe hypokinesis of the basal-midinferolateral,   inferior, and inferoseptal myocardium. - Ventricular septum: Septal motion showed moderate paradox. These   changes are consistent with intraventricular conduction delay. - Aortic valve: Poorly visualized. Trileaflet; normal thickness,   mildly calcified leaflets. - Mitral valve: There was mild regurgitation. - Left atrium: The atrium was severely dilated. - Right atrium: The atrium was mildly dilated. - Pulmonic valve: There was mild regurgitation. - Pulmonary arteries: PA peak pressure: 35 mm Hg (S).  EKG:  EKG is not ordered today. The ekg ordered today demonstrates   Recent Labs: 12/19/2015: B Natriuretic Peptide 362.3 12/22/2015: Magnesium 2.1 01/20/2016: TSH 1.50 01/30/2016: ALT 32; ALT 31; BUN 19; Creat 1.15; Hemoglobin 14.4; Platelets 229; Potassium 3.7; Sodium 144   Lipid Panel    Component Value Date/Time   CHOL 130 12/20/2015 0325   TRIG 51 12/20/2015 0325   HDL 27 (L) 12/20/2015 0325   CHOLHDL 4.8 12/20/2015 0325   VLDL 10 12/20/2015 0325   LDLCALC 93 12/20/2015 0325     Wt Readings from Last 3 Encounters:  05/17/16 183 lb 3.2 oz (83.1 kg)  02/08/16 185 lb (83.9 kg)  01/20/16 182 lb 1.9 oz (82.6 kg)     Other studies Reviewed: Additional studies/ records that were reviewed today include: . Review of the above records demonstrates:   Assessment and Plan:   1. Abnormal nuclear stress test: We have discussed arranging a cardiac cath. He is having no chest pain or dyspnea.  He wishes to delay cath for now.   2. Chronic systolic CHF/cardiomyopathy:  Unclear if his cardiomyopathy is due to rapid atrial fib or underlying CAD vs other. Stress test is abnormal. We have spoken about a cardiac cath in the past. He is taking Lasix every day and his weight is stable. No LE edema. He wishes to delay cardiac cath at this time as he feels well.   3. Atrial fib, paroxysmal: He is in sinus today. Will continue metoprolol, amiodarone for now. Will continue Eliquis. He has had a normal TSH, chest x-ray and eye exam this year.   Current medicines are reviewed at length with the patient today.  The patient does not have concerns regarding medicines.  The  following changes have been made:  no change  Labs/ tests ordered today include:  No orders of the defined types were placed in this encounter.    Disposition:   FU with me in 6 months   Signed, Lauree Chandler, MD 05/17/2016 1:42 PM    [facility] [place], [place], [place]  [address] Phone: [phone]; Fax: [phone]

## 2016-05-17 NOTE — Patient Instructions (Addendum)
Medication Instructions:   Your physician has recommended you make the following change in your medication: Decrease amiodarone to 200 mg by mouth daily  Labwork: none  Testing/Procedures: none  Follow-Up: Your physician recommends that you schedule a follow-up appointment in: 6 months. Please call our office in about 3 months to schedule this appointment    Any Other Special Instructions Will Be Listed Below (If Applicable).     If you need a refill on your cardiac medications before your next appointment, please call your pharmacy.

## 2016-05-30 ENCOUNTER — Inpatient Hospital Stay (HOSPITAL_COMMUNITY)
Admission: EM | Admit: 2016-05-30 | Discharge: 2016-06-06 | DRG: 689 | Disposition: A | Discharge status: Home Health | Payer: Medicare Other | Attending: Internal Medicine | Admitting: Internal Medicine

## 2016-05-30 ENCOUNTER — Other Ambulatory Visit: Payer: Self-pay | Admitting: Internal Medicine

## 2016-05-30 DIAGNOSIS — M419 Scoliosis, unspecified: Secondary | ICD-10-CM | POA: Diagnosis present

## 2016-05-30 DIAGNOSIS — E860 Dehydration: Secondary | ICD-10-CM

## 2016-05-30 DIAGNOSIS — Z8711 Personal history of peptic ulcer disease: Secondary | ICD-10-CM

## 2016-05-30 DIAGNOSIS — I420 Dilated cardiomyopathy: Secondary | ICD-10-CM | POA: Diagnosis present

## 2016-05-30 DIAGNOSIS — R7989 Other specified abnormal findings of blood chemistry: Secondary | ICD-10-CM | POA: Diagnosis present

## 2016-05-30 DIAGNOSIS — N1330 Unspecified hydronephrosis: Secondary | ICD-10-CM | POA: Diagnosis present

## 2016-05-30 DIAGNOSIS — S80211A Abrasion, right knee, initial encounter: Secondary | ICD-10-CM | POA: Diagnosis present

## 2016-05-30 DIAGNOSIS — I130 Hypertensive heart and chronic kidney disease with heart failure and stage 1 through stage 4 chronic kidney disease, or unspecified chronic kidney disease: Secondary | ICD-10-CM | POA: Diagnosis not present

## 2016-05-30 DIAGNOSIS — I5042 Chronic combined systolic (congestive) and diastolic (congestive) heart failure: Secondary | ICD-10-CM | POA: Diagnosis present

## 2016-05-30 DIAGNOSIS — I4891 Unspecified atrial fibrillation: Secondary | ICD-10-CM | POA: Diagnosis present

## 2016-05-30 DIAGNOSIS — N189 Chronic kidney disease, unspecified: Secondary | ICD-10-CM

## 2016-05-30 DIAGNOSIS — W1839XA Other fall on same level, initial encounter: Secondary | ICD-10-CM | POA: Diagnosis present

## 2016-05-30 DIAGNOSIS — I10 Essential (primary) hypertension: Secondary | ICD-10-CM | POA: Diagnosis present

## 2016-05-30 DIAGNOSIS — R778 Other specified abnormalities of plasma proteins: Secondary | ICD-10-CM | POA: Diagnosis present

## 2016-05-30 DIAGNOSIS — I517 Cardiomegaly: Secondary | ICD-10-CM | POA: Diagnosis not present

## 2016-05-30 DIAGNOSIS — R296 Repeated falls: Secondary | ICD-10-CM | POA: Diagnosis present

## 2016-05-30 DIAGNOSIS — N183 Chronic kidney disease, stage 3 (moderate): Secondary | ICD-10-CM | POA: Diagnosis present

## 2016-05-30 DIAGNOSIS — K761 Chronic passive congestion of liver: Secondary | ICD-10-CM | POA: Diagnosis present

## 2016-05-30 DIAGNOSIS — R404 Transient alteration of awareness: Secondary | ICD-10-CM | POA: Diagnosis not present

## 2016-05-30 DIAGNOSIS — Y92000 Kitchen of unspecified non-institutional (private) residence as  the place of occurrence of the external cause: Secondary | ICD-10-CM

## 2016-05-30 DIAGNOSIS — I081 Rheumatic disorders of both mitral and tricuspid valves: Secondary | ICD-10-CM | POA: Diagnosis present

## 2016-05-30 DIAGNOSIS — R7303 Prediabetes: Secondary | ICD-10-CM | POA: Diagnosis present

## 2016-05-30 DIAGNOSIS — N390 Urinary tract infection, site not specified: Secondary | ICD-10-CM | POA: Diagnosis not present

## 2016-05-30 DIAGNOSIS — R531 Weakness: Secondary | ICD-10-CM | POA: Diagnosis not present

## 2016-05-30 DIAGNOSIS — J189 Pneumonia, unspecified organism: Secondary | ICD-10-CM | POA: Diagnosis not present

## 2016-05-30 DIAGNOSIS — I480 Paroxysmal atrial fibrillation: Secondary | ICD-10-CM | POA: Diagnosis present

## 2016-05-30 DIAGNOSIS — I5022 Chronic systolic (congestive) heart failure: Secondary | ICD-10-CM | POA: Diagnosis present

## 2016-05-30 DIAGNOSIS — Z79899 Other long term (current) drug therapy: Secondary | ICD-10-CM

## 2016-05-30 DIAGNOSIS — K449 Diaphragmatic hernia without obstruction or gangrene: Secondary | ICD-10-CM | POA: Diagnosis present

## 2016-05-30 DIAGNOSIS — N3000 Acute cystitis without hematuria: Secondary | ICD-10-CM

## 2016-05-30 DIAGNOSIS — R740 Nonspecific elevation of levels of transaminase and lactic acid dehydrogenase [LDH]: Secondary | ICD-10-CM | POA: Diagnosis present

## 2016-05-30 DIAGNOSIS — I447 Left bundle-branch block, unspecified: Secondary | ICD-10-CM | POA: Diagnosis present

## 2016-05-30 DIAGNOSIS — N401 Enlarged prostate with lower urinary tract symptoms: Secondary | ICD-10-CM | POA: Diagnosis present

## 2016-05-30 DIAGNOSIS — R0602 Shortness of breath: Secondary | ICD-10-CM

## 2016-05-30 DIAGNOSIS — I272 Pulmonary hypertension, unspecified: Secondary | ICD-10-CM | POA: Diagnosis present

## 2016-05-30 DIAGNOSIS — K219 Gastro-esophageal reflux disease without esophagitis: Secondary | ICD-10-CM | POA: Diagnosis present

## 2016-05-30 DIAGNOSIS — R338 Other retention of urine: Secondary | ICD-10-CM | POA: Diagnosis present

## 2016-05-30 DIAGNOSIS — N179 Acute kidney failure, unspecified: Secondary | ICD-10-CM | POA: Diagnosis not present

## 2016-05-30 DIAGNOSIS — I495 Sick sinus syndrome: Secondary | ICD-10-CM | POA: Diagnosis present

## 2016-05-30 DIAGNOSIS — W19XXXA Unspecified fall, initial encounter: Secondary | ICD-10-CM | POA: Diagnosis present

## 2016-05-30 DIAGNOSIS — I371 Nonrheumatic pulmonary valve insufficiency: Secondary | ICD-10-CM | POA: Diagnosis present

## 2016-05-30 DIAGNOSIS — Y95 Nosocomial condition: Secondary | ICD-10-CM | POA: Diagnosis not present

## 2016-05-30 DIAGNOSIS — K8020 Calculus of gallbladder without cholecystitis without obstruction: Secondary | ICD-10-CM | POA: Diagnosis present

## 2016-05-30 DIAGNOSIS — Z8249 Family history of ischemic heart disease and other diseases of the circulatory system: Secondary | ICD-10-CM

## 2016-05-30 DIAGNOSIS — Z7901 Long term (current) use of anticoagulants: Secondary | ICD-10-CM

## 2016-05-30 DIAGNOSIS — E785 Hyperlipidemia, unspecified: Secondary | ICD-10-CM | POA: Diagnosis present

## 2016-05-30 NOTE — ED Provider Notes (Signed)
[facility] DEPT Provider Note   CSN: [id number] Arrival date & time: 05/30/16  2350  By signing my name below, I, Oleh Genin, attest that this documentation has been prepared under the direction and in the presence of Ripley Fraise, MD. Electronically Signed: Oleh Genin, Scribe. 05/31/16. 12:00 AM.   History   Chief Complaint No chief complaint on file.   HPI Stephen Copeland is a 76 y.o. male with history of HTN, HLD, sinus bradycadia, atrial fibrillation with LBBB who presents to the ED following a fall. This patient states that in the last 2 days he has felt globally weak. Tonight, he states that he fell in the kitchen due to "his knees giving out". No head trauma or LOC. He called EMS because he was unable to stand. At interview, he is reporting some mild R knee pain and mild dyspnea. His course is worsening Nothing improves his symptoms Activity worsens his symptoms  The history is provided by the patient. No language interpreter was used.    Past Medical History:  Diagnosis Date  . Acid reflux   . Folliculitis   . Hiatal hernia   . Hyperlipidemia    a. pt is adamantly against statins.  . Hypertension   . Ischemia    a. Pt states he was diagnosed with "ischemia" in the 1990s but does not know further details, denies hx of heart blockage.  . Nummular dermatitis   . Scoliosis   . Sinus bradycardia   . Stomach ulcer    a. remote ulcer in the 1990s, no hx of bleeding.    Patient Active Problem List   Diagnosis Date Noted  . Acute systolic CHF (congestive heart failure) ([place])   . Congestive dilated cardiomyopathy ([place])   . Atrial fibrillation with RVR ([place])   . Uncontrolled hypertension   . Pulmonary hypertension   . Chronic systolic CHF (congestive heart failure) ([place])   . Atrial fibrillation ([place]) 12/19/2015  . Hypertension 12/19/2015  . New onset a-fib ([place]) 12/19/2015  . Acute CHF ([place]) 12/19/2015  . Hyperglycemia 12/19/2015  . Acid reflux   .  Hyperlipidemia   . Sinus bradycardia   . Gastroesophageal reflux disease   . Other hyperlipidemia   . Elevated troponin     No past surgical history on file.     Home Medications    Prior to Admission medications   Medication Sig Start Date End Date Taking? Authorizing Provider  amiodarone (PACERONE) 200 MG tablet Take 1 tablet (200 mg total) by mouth daily. 05/17/16   Burnell Blanks, MD  apixaban (ELIQUIS) 5 MG TABS tablet Take 1 tablet (5 mg total) by mouth 2 (two) times daily. 03/01/16   Burnell Blanks, MD  atorvastatin (LIPITOR) 20 MG tablet Take 1 tablet (20 mg total) by mouth daily at 6 PM. 05/17/16   Burnell Blanks, MD  ENSURE PLUS (ENSURE PLUS) LIQD Take 237 mLs by mouth daily.    Historical Provider, MD  furosemide (LASIX) 20 MG tablet Take 1 tablet (20 mg total) by mouth daily. 05/17/16   Burnell Blanks, MD  lisinopril (PRINIVIL,ZESTRIL) 5 MG tablet Take 1 tablet (5 mg total) by mouth 2 (two) times daily. 05/17/16   Burnell Blanks, MD  metoprolol tartrate (LOPRESSOR) 25 MG tablet Take 1 tablet (25 mg total) by mouth 2 (two) times daily. 05/17/16   Burnell Blanks, MD  nitroGLYCERIN (NITROSTAT) 0.4 MG SL tablet Place 1 tablet (0.4 mg total) under the tongue every 5 (  five) minutes as needed for chest pain. 02/20/16   Burnell Blanks, MD  tamsulosin (FLOMAX) 0.4 MG CAPS capsule Take 0.4 mg by mouth daily.    Historical Provider, MD    Family History Family History  Problem Relation Age of Onset  . Heart disease Mother     Further details not reported, died at age 52  . Valvular heart disease Father     H/o MV surgery, diet at age 77    Social History Social History  Substance Use Topics  . Smoking status: Never Smoker  . Smokeless tobacco: Never Used  . Alcohol use No     Allergies   Patient has no known allergies.   Review of Systems Review of Systems  Constitutional: Positive for fatigue.  Respiratory:  Positive for shortness of breath.   Cardiovascular: Negative for chest pain.  Musculoskeletal:       R knee pain  All other systems reviewed and are negative.    Physical Exam Updated Vital Signs SpO2 92%   Physical Exam CONSTITUTIONAL: Elderly and fraile HEAD: Normocephalic/atraumatic EYES: EOMI ENMT: Mucous membranes dry NECK: supple no meningeal signs SPINE/BACK:entire spine nontender CV: S1/S2 noted, no murmurs/rubs/gallops noted LUNGS: Lungs are clear to auscultation bilaterally, no apparent distress ABDOMEN: soft, nontender, no rebound or guarding, bowel sounds noted throughout abdomen GU:no cva tenderness NEURO: Pt is awake/alert/appropriate, moves all extremitiesx4.  No facial droop.   EXTREMITIES: pulses normal/equal, full ROM small abrasion to the R knee, All other extremities/joints palpated/ranged and nontender Pelvis stable. No focal bony tenderness SKIN: warm, color normal PSYCH: no abnormalities of mood noted, alert and oriented to situation   ED Treatments / Results  Labs (all labs ordered are listed, but only abnormal results are displayed) Labs Reviewed  BASIC METABOLIC PANEL - Abnormal; Notable for the following:       Result Value   Glucose, Bld 121 (*)    BUN 35 (*)    Creatinine, Ser 2.14 (*)    GFR calc non Af Amer 29 (*)    GFR calc Af Amer 33 (*)    All other components within normal limits  CBC WITH DIFFERENTIAL/PLATELET - Abnormal; Notable for the following:    Monocytes Absolute 1.7 (*)    All other components within normal limits  TROPONIN I - Abnormal; Notable for the following:    Troponin I 0.13 (*)    All other components within normal limits  URINALYSIS, ROUTINE W REFLEX MICROSCOPIC - Abnormal; Notable for the following:    Color, Urine AMBER (*)    APPearance CLOUDY (*)    Hgb urine dipstick MODERATE (*)    Protein, ur 100 (*)    Leukocytes, UA LARGE (*)    Bacteria, UA MANY (*)    All other components within normal limits    URINE CULTURE    EKG  EKG Interpretation  Date/Time:  Wednesday May 30 2016 23:52:39 EDT Ventricular Rate:  79 PR Interval:    QRS Duration: 184 QT Interval:  451 QTC Calculation: 518 R Axis:   -65 Text Interpretation:  Sinus rhythm Short PR interval Probable left atrial enlargement Left bundle branch block Baseline wander in lead(s) II III aVF V3 Left bundle branch block Confirmed by Gerald Leitz ([id number]) on 05/30/2016 11:56:58 PM       Radiology Dg Chest Portable 1 View  Result Date: 05/31/2016 CLINICAL DATA:  Initial evaluation for acute weakness. EXAM: PORTABLE CHEST 1 VIEW COMPARISON:  Prior radiograph from 12/19/2015.  FINDINGS: Moderate cardiomegaly, stable from prior. Mediastinal silhouette within normal limits. Aortic atherosclerosis noted. Lungs hypoinflated. Secondary bibasilar and perihilar vascular congestion. Linear scarring noted within the right perihilar region, similar to previous. No pulmonary edema or pleural effusion. No definite focal infiltrates. No pneumothorax. No acute osseus abnormality. IMPRESSION: 1. Shallow lung inflation with secondary bibasilar/perihilar bronchovascular crowding. No other active cardiopulmonary disease. 2. Stable cardiomegaly. 3. Aortic atherosclerosis. Electronically Signed   By: Jeannine Boga M.D.   On: 05/31/2016 00:44    Procedures Procedures   CRITICAL CARE Performed by: Sharyon Cable Total critical care time: 33 minutes Critical care time was exclusive of separately billable procedures and treating other patients. Critical care was necessary to treat or prevent imminent or life-threatening deterioration. Critical care was time spent personally by me on the following activities: development of treatment plan with patient and/or surrogate as well as nursing, discussions with consultants, evaluation of patient's response to treatment, examination of patient, obtaining history from patient or surrogate, ordering and  performing treatments and interventions, ordering and review of laboratory studies, ordering and review of radiographic studies, pulse oximetry and re-evaluation of patient's condition. PATIENT WITH WEAKNESS, ELEVATED TROPONIN, ACUTE RENAL FAILURE AND REQUIRING ADMISSION  Medications Ordered in ED Medications  cefTRIAXone (ROCEPHIN) 1 g in dextrose 5 % 50 mL IVPB (0 g Intravenous Stopped 05/31/16 0341)     Initial Impression / Assessment and Plan / ED Course  I have reviewed the triage vital signs and the nursing notes.  Pertinent labs & imaging results that were available during my care of the patient were reviewed by me and considered in my medical decision making (see chart for details).    4:12 AM  Pt seen on arrival for possible STEMI Review EKG with cardiology, code STEMI cancelled, left bundle branch block noted Discussed troponin results with cardiology, recommend medical admit, he does not need emergent cath at this time per cardiology fellow Pt awake/alert, no distress He was given rocephin for UTI He also has renal insufficiency with some associated urinary retention (bladder scan =835ml) D/w dr Fuller Plan for admission BP (!) 162/102 (BP Location: Right Arm)   Pulse 81   Temp 98.9 F (37.2 C) (Oral)   Resp 18   SpO2 93%   Final Clinical Impressions(s) / ED Diagnoses   Final diagnoses:  Acute cystitis without hematuria  AKI (acute kidney injury) ([place])  Dehydration    New Prescriptions New Prescriptions   No medications on file  I personally performed the services described in this documentation, which was scribed in my presence. The recorded information has been reviewed and is accurate.       Ripley Fraise, MD 05/31/16 [phone]

## 2016-05-31 ENCOUNTER — Encounter (HOSPITAL_COMMUNITY): Payer: Self-pay | Admitting: Emergency Medicine

## 2016-05-31 ENCOUNTER — Inpatient Hospital Stay (HOSPITAL_COMMUNITY): Payer: Medicare Other

## 2016-05-31 ENCOUNTER — Emergency Department (HOSPITAL_COMMUNITY): Payer: Medicare Other

## 2016-05-31 DIAGNOSIS — I480 Paroxysmal atrial fibrillation: Secondary | ICD-10-CM | POA: Diagnosis not present

## 2016-05-31 DIAGNOSIS — S80211A Abrasion, right knee, initial encounter: Secondary | ICD-10-CM | POA: Diagnosis present

## 2016-05-31 DIAGNOSIS — I272 Pulmonary hypertension, unspecified: Secondary | ICD-10-CM | POA: Diagnosis present

## 2016-05-31 DIAGNOSIS — W19XXXA Unspecified fall, initial encounter: Secondary | ICD-10-CM | POA: Diagnosis not present

## 2016-05-31 DIAGNOSIS — N342 Other urethritis: Secondary | ICD-10-CM

## 2016-05-31 DIAGNOSIS — N3001 Acute cystitis with hematuria: Secondary | ICD-10-CM | POA: Diagnosis not present

## 2016-05-31 DIAGNOSIS — N1330 Unspecified hydronephrosis: Secondary | ICD-10-CM | POA: Diagnosis not present

## 2016-05-31 DIAGNOSIS — N3000 Acute cystitis without hematuria: Secondary | ICD-10-CM | POA: Diagnosis not present

## 2016-05-31 DIAGNOSIS — N179 Acute kidney failure, unspecified: Secondary | ICD-10-CM

## 2016-05-31 DIAGNOSIS — I509 Heart failure, unspecified: Secondary | ICD-10-CM | POA: Diagnosis not present

## 2016-05-31 DIAGNOSIS — I447 Left bundle-branch block, unspecified: Secondary | ICD-10-CM | POA: Diagnosis present

## 2016-05-31 DIAGNOSIS — Y92000 Kitchen of unspecified non-institutional (private) residence as  the place of occurrence of the external cause: Secondary | ICD-10-CM | POA: Diagnosis not present

## 2016-05-31 DIAGNOSIS — K8050 Calculus of bile duct without cholangitis or cholecystitis without obstruction: Secondary | ICD-10-CM | POA: Diagnosis not present

## 2016-05-31 DIAGNOSIS — N10 Acute tubulo-interstitial nephritis: Secondary | ICD-10-CM | POA: Diagnosis not present

## 2016-05-31 DIAGNOSIS — R748 Abnormal levels of other serum enzymes: Secondary | ICD-10-CM | POA: Diagnosis not present

## 2016-05-31 DIAGNOSIS — R338 Other retention of urine: Secondary | ICD-10-CM

## 2016-05-31 DIAGNOSIS — E860 Dehydration: Secondary | ICD-10-CM | POA: Diagnosis not present

## 2016-05-31 DIAGNOSIS — R531 Weakness: Secondary | ICD-10-CM | POA: Diagnosis not present

## 2016-05-31 DIAGNOSIS — N400 Enlarged prostate without lower urinary tract symptoms: Secondary | ICD-10-CM | POA: Diagnosis not present

## 2016-05-31 DIAGNOSIS — R319 Hematuria, unspecified: Secondary | ICD-10-CM | POA: Diagnosis not present

## 2016-05-31 DIAGNOSIS — E785 Hyperlipidemia, unspecified: Secondary | ICD-10-CM | POA: Diagnosis not present

## 2016-05-31 DIAGNOSIS — J189 Pneumonia, unspecified organism: Secondary | ICD-10-CM | POA: Diagnosis not present

## 2016-05-31 DIAGNOSIS — R4182 Altered mental status, unspecified: Secondary | ICD-10-CM | POA: Diagnosis not present

## 2016-05-31 DIAGNOSIS — N183 Chronic kidney disease, stage 3 (moderate): Secondary | ICD-10-CM | POA: Diagnosis present

## 2016-05-31 DIAGNOSIS — R05 Cough: Secondary | ICD-10-CM | POA: Diagnosis not present

## 2016-05-31 DIAGNOSIS — I10 Essential (primary) hypertension: Secondary | ICD-10-CM | POA: Diagnosis not present

## 2016-05-31 DIAGNOSIS — I5022 Chronic systolic (congestive) heart failure: Secondary | ICD-10-CM | POA: Diagnosis not present

## 2016-05-31 DIAGNOSIS — N189 Chronic kidney disease, unspecified: Secondary | ICD-10-CM | POA: Diagnosis not present

## 2016-05-31 DIAGNOSIS — N3289 Other specified disorders of bladder: Secondary | ICD-10-CM | POA: Diagnosis not present

## 2016-05-31 DIAGNOSIS — K219 Gastro-esophageal reflux disease without esophagitis: Secondary | ICD-10-CM | POA: Diagnosis present

## 2016-05-31 DIAGNOSIS — I081 Rheumatic disorders of both mitral and tricuspid valves: Secondary | ICD-10-CM | POA: Diagnosis present

## 2016-05-31 DIAGNOSIS — K761 Chronic passive congestion of liver: Secondary | ICD-10-CM | POA: Diagnosis present

## 2016-05-31 DIAGNOSIS — I371 Nonrheumatic pulmonary valve insufficiency: Secondary | ICD-10-CM | POA: Diagnosis present

## 2016-05-31 DIAGNOSIS — Y95 Nosocomial condition: Secondary | ICD-10-CM | POA: Diagnosis not present

## 2016-05-31 DIAGNOSIS — I4891 Unspecified atrial fibrillation: Secondary | ICD-10-CM | POA: Diagnosis not present

## 2016-05-31 DIAGNOSIS — M419 Scoliosis, unspecified: Secondary | ICD-10-CM | POA: Diagnosis not present

## 2016-05-31 DIAGNOSIS — I130 Hypertensive heart and chronic kidney disease with heart failure and stage 1 through stage 4 chronic kidney disease, or unspecified chronic kidney disease: Secondary | ICD-10-CM | POA: Diagnosis present

## 2016-05-31 DIAGNOSIS — W1839XA Other fall on same level, initial encounter: Secondary | ICD-10-CM | POA: Diagnosis present

## 2016-05-31 DIAGNOSIS — I517 Cardiomegaly: Secondary | ICD-10-CM | POA: Diagnosis not present

## 2016-05-31 DIAGNOSIS — I5042 Chronic combined systolic (congestive) and diastolic (congestive) heart failure: Secondary | ICD-10-CM | POA: Diagnosis not present

## 2016-05-31 DIAGNOSIS — I495 Sick sinus syndrome: Secondary | ICD-10-CM | POA: Diagnosis present

## 2016-05-31 DIAGNOSIS — R296 Repeated falls: Secondary | ICD-10-CM | POA: Diagnosis present

## 2016-05-31 DIAGNOSIS — N401 Enlarged prostate with lower urinary tract symptoms: Secondary | ICD-10-CM | POA: Diagnosis present

## 2016-05-31 DIAGNOSIS — I420 Dilated cardiomyopathy: Secondary | ICD-10-CM | POA: Diagnosis present

## 2016-05-31 DIAGNOSIS — N390 Urinary tract infection, site not specified: Secondary | ICD-10-CM | POA: Diagnosis present

## 2016-05-31 DIAGNOSIS — R7303 Prediabetes: Secondary | ICD-10-CM | POA: Diagnosis present

## 2016-05-31 LAB — HEPATIC FUNCTION PANEL
ALBUMIN: 3.3 g/dL — AB (ref 3.5–5.0)
ALK PHOS: 114 U/L (ref 38–126)
ALT: 226 U/L — AB (ref 17–63)
ALT: 268 U/L — ABNORMAL HIGH (ref 17–63)
AST: 290 U/L — ABNORMAL HIGH (ref 15–41)
AST: 324 U/L — AB (ref 15–41)
Albumin: 3.2 g/dL — ABNORMAL LOW (ref 3.5–5.0)
Alkaline Phosphatase: 106 U/L (ref 38–126)
BILIRUBIN DIRECT: 0.4 mg/dL (ref 0.1–0.5)
BILIRUBIN INDIRECT: 0.4 mg/dL (ref 0.3–0.9)
BILIRUBIN TOTAL: 0.8 mg/dL (ref 0.3–1.2)
Bilirubin, Direct: 0.5 mg/dL (ref 0.1–0.5)
Indirect Bilirubin: 1 mg/dL — ABNORMAL HIGH (ref 0.3–0.9)
TOTAL PROTEIN: 6.3 g/dL — AB (ref 6.5–8.1)
Total Bilirubin: 1.5 mg/dL — ABNORMAL HIGH (ref 0.3–1.2)
Total Protein: 6.3 g/dL — ABNORMAL LOW (ref 6.5–8.1)

## 2016-05-31 LAB — CBC WITH DIFFERENTIAL/PLATELET
Basophils Absolute: 0.1 10*3/uL (ref 0.0–0.1)
Basophils Relative: 1 %
EOS ABS: 0 10*3/uL (ref 0.0–0.7)
EOS PCT: 0 %
HCT: 41.8 % (ref 39.0–52.0)
HEMOGLOBIN: 13.9 g/dL (ref 13.0–17.0)
Lymphocytes Relative: 9 %
Lymphs Abs: 0.9 10*3/uL (ref 0.7–4.0)
MCH: 31.4 pg (ref 26.0–34.0)
MCHC: 33.3 g/dL (ref 30.0–36.0)
MCV: 94.4 fL (ref 78.0–100.0)
MONOS PCT: 18 %
Monocytes Absolute: 1.7 10*3/uL — ABNORMAL HIGH (ref 0.1–1.0)
NEUTROS PCT: 72 %
Neutro Abs: 7.1 10*3/uL (ref 1.7–7.7)
PLATELETS: 196 10*3/uL (ref 150–400)
RBC: 4.43 MIL/uL (ref 4.22–5.81)
RDW: 14.1 % (ref 11.5–15.5)
WBC: 9.7 10*3/uL (ref 4.0–10.5)

## 2016-05-31 LAB — URINALYSIS, ROUTINE W REFLEX MICROSCOPIC
BILIRUBIN URINE: NEGATIVE
Glucose, UA: NEGATIVE mg/dL
Ketones, ur: NEGATIVE mg/dL
Nitrite: NEGATIVE
Protein, ur: 100 mg/dL — AB
SPECIFIC GRAVITY, URINE: 1.012 (ref 1.005–1.030)
Squamous Epithelial / HPF: NONE SEEN
pH: 5 (ref 5.0–8.0)

## 2016-05-31 LAB — BASIC METABOLIC PANEL
ANION GAP: 16 — AB (ref 5–15)
Anion gap: 14 (ref 5–15)
BUN: 35 mg/dL — ABNORMAL HIGH (ref 6–20)
BUN: 37 mg/dL — ABNORMAL HIGH (ref 6–20)
CALCIUM: 9.1 mg/dL (ref 8.9–10.3)
CALCIUM: 9.2 mg/dL (ref 8.9–10.3)
CO2: 24 mmol/L (ref 22–32)
CO2: 25 mmol/L (ref 22–32)
CREATININE: 2.14 mg/dL — AB (ref 0.61–1.24)
Chloride: 103 mmol/L (ref 101–111)
Chloride: 100 mmol/L — ABNORMAL LOW (ref 101–111)
Creatinine, Ser: 2.15 mg/dL — ABNORMAL HIGH (ref 0.61–1.24)
GFR calc Af Amer: 33 mL/min — ABNORMAL LOW (ref >60)
GFR calc non Af Amer: 29 mL/min — ABNORMAL LOW (ref >60)
GFR calc non Af Amer: 28 mL/min — ABNORMAL LOW (ref >60)
GFR, EST AFRICAN AMERICAN: 33 mL/min — AB (ref >60)
GLUCOSE: 113 mg/dL — AB (ref 65–99)
Glucose, Bld: 121 mg/dL — ABNORMAL HIGH (ref 65–99)
POTASSIUM: 3.2 mmol/L — AB (ref 3.5–5.1)
Potassium: 3.7 mmol/L (ref 3.5–5.1)
SODIUM: 141 mmol/L (ref 135–145)
Sodium: 141 mmol/L (ref 135–145)

## 2016-05-31 LAB — TROPONIN I
TROPONIN I: 0.1 ng/mL — AB (ref <0.03)
TROPONIN I: 0.13 ng/mL — AB (ref <0.03)
Troponin I: 0.1 ng/mL (ref <0.03)
Troponin I: 0.12 ng/mL (ref <0.03)

## 2016-05-31 LAB — CK: CK TOTAL: 312 U/L (ref 49–397)

## 2016-05-31 LAB — T4, FREE: Free T4: 1.6 ng/dL — ABNORMAL HIGH (ref 0.61–1.12)

## 2016-05-31 LAB — TSH: TSH: 0.282 u[IU]/mL — ABNORMAL LOW (ref 0.350–4.500)

## 2016-05-31 LAB — SEDIMENTATION RATE: Sed Rate: 34 mm/hr — ABNORMAL HIGH (ref 0–16)

## 2016-05-31 MED ORDER — ACETAMINOPHEN 650 MG RE SUPP: 650.0000 mg | Freq: Four times a day (QID) | RECTAL | Status: DC | PRN

## 2016-05-31 MED ORDER — AMIODARONE HCL 200 MG PO TABS
200.0000 mg | ORAL_TABLET | Freq: Every day | ORAL | Status: DC
  Administered 2016-05-31: 200 mg via ORAL
  Filled 2016-05-31: qty 1

## 2016-05-31 MED ORDER — HYDRALAZINE HCL 25 MG PO TABS
25.0000 mg | ORAL_TABLET | Freq: Three times a day (TID) | ORAL | Status: DC
  Administered 2016-05-31 – 2016-06-05 (×15): 25 mg via ORAL
  Filled 2016-05-31 (×15): qty 1

## 2016-05-31 MED ORDER — SODIUM CHLORIDE 0.9 % IV BOLUS (SEPSIS): 500.0000 mL | Freq: Once | INTRAVENOUS | Status: DC

## 2016-05-31 MED ORDER — ACETAMINOPHEN 325 MG PO TABS: 650.0000 mg | ORAL_TABLET | Freq: Four times a day (QID) | ORAL | Status: DC | PRN

## 2016-05-31 MED ORDER — SODIUM CHLORIDE 0.9 % IV BOLUS (SEPSIS)
250.0000 mL | Freq: Once | INTRAVENOUS | Status: AC
  Administered 2016-05-31: 250 mL via INTRAVENOUS

## 2016-05-31 MED ORDER — ENSURE ENLIVE PO LIQD
237.0000 mL | Freq: Every day | ORAL | Status: DC
  Administered 2016-05-31 – 2016-06-05 (×2): 237 mL via ORAL

## 2016-05-31 MED ORDER — CEFTRIAXONE SODIUM 1 G IJ SOLR
1.0000 g | Freq: Once | INTRAMUSCULAR | Status: AC
  Administered 2016-05-31: 1 g via INTRAVENOUS
  Filled 2016-05-31: qty 10

## 2016-05-31 MED ORDER — METOPROLOL TARTRATE 25 MG PO TABS
25.0000 mg | ORAL_TABLET | Freq: Two times a day (BID) | ORAL | Status: DC
  Administered 2016-05-31 (×2): 25 mg via ORAL
  Filled 2016-05-31 (×2): qty 1

## 2016-05-31 MED ORDER — IPRATROPIUM-ALBUTEROL 0.5-2.5 (3) MG/3ML IN SOLN: 3.0000 mL | RESPIRATORY_TRACT | Status: DC | PRN

## 2016-05-31 MED ORDER — POTASSIUM CHLORIDE CRYS ER 20 MEQ PO TBCR
40.0000 meq | EXTENDED_RELEASE_TABLET | Freq: Once | ORAL | Status: AC
  Administered 2016-05-31: 40 meq via ORAL
  Filled 2016-05-31: qty 2

## 2016-05-31 MED ORDER — ONDANSETRON HCL 4 MG PO TABS: 4.0000 mg | ORAL_TABLET | Freq: Four times a day (QID) | ORAL | Status: DC | PRN

## 2016-05-31 MED ORDER — NITROGLYCERIN 0.4 MG SL SUBL: 0.4000 mg | SUBLINGUAL_TABLET | SUBLINGUAL | Status: DC | PRN

## 2016-05-31 MED ORDER — TAMSULOSIN HCL 0.4 MG PO CAPS
0.4000 mg | ORAL_CAPSULE | Freq: Every day | ORAL | Status: DC
  Administered 2016-05-31 – 2016-06-06 (×7): 0.4 mg via ORAL
  Filled 2016-05-31 (×7): qty 1

## 2016-05-31 MED ORDER — POTASSIUM CHLORIDE IN NACL 40-0.9 MEQ/L-% IV SOLN
INTRAVENOUS | Status: DC
  Administered 2016-05-31 – 2016-06-01 (×3): 75 mL/h via INTRAVENOUS
  Filled 2016-05-31 (×5): qty 1000

## 2016-05-31 MED ORDER — DEXTROSE 5 % IV SOLN
1.0000 g | INTRAVENOUS | Status: DC
  Administered 2016-05-31 – 2016-06-02 (×3): 1 g via INTRAVENOUS
  Filled 2016-05-31 (×3): qty 10

## 2016-05-31 MED ORDER — SODIUM CHLORIDE 0.9 % IV SOLN: INTRAVENOUS | Status: DC

## 2016-05-31 MED ORDER — ONDANSETRON HCL 4 MG/2ML IJ SOLN
4.0000 mg | Freq: Four times a day (QID) | INTRAMUSCULAR | Status: DC | PRN
  Administered 2016-05-31: 4 mg via INTRAVENOUS
  Filled 2016-05-31: qty 2

## 2016-05-31 MED ORDER — APIXABAN 5 MG PO TABS
5.0000 mg | ORAL_TABLET | Freq: Two times a day (BID) | ORAL | Status: DC
  Administered 2016-05-31 – 2016-06-06 (×13): 5 mg via ORAL
  Filled 2016-05-31 (×13): qty 1

## 2016-05-31 MED ORDER — HYDRALAZINE HCL 20 MG/ML IJ SOLN: 5.0000 mg | Freq: Four times a day (QID) | INTRAMUSCULAR | Status: DC | PRN

## 2016-05-31 NOTE — H&P (Addendum)
History and Physical    Stephen Copeland GQQ:[id number] DOB: 1940/03/18 DOA: 05/30/2016  Referring MD/NP/PA: Dr. Christy Gentles PCP: No PCP Per Patient  Patient coming from: Home via EMS  Chief Complaint: Falls  HPI: Stephen Copeland is a 76 y.o. male with medical history significant of HTN, HLD, A. Fib with LBBB, systolic CHF with last EF 40%, dilated cardiomyopathy, pre-diabetes; who presents with complaints of at least 3 falls  days.  Patient provides his own history, but seems somewhat of a poor historian. Patient reports falling because his knee gave out. Denies any loss of consciousness or trauma to his head. EMS had to be called out as he was unable to stand, but was noted to have initially refused transport. associated symptoms include generalized weakness, shortness of breath, lower abdominal pain and discomfort, fatigue, decreased appetite over the last 1 month, increased lethargy over the last 2 months, reports eating more ice suggestive of pica, and all foods/drinks tasting sweet. He denies having any significant chest pain, nausea, vomiting, diarrhea, Furthermore patient notes that he is being followed by urology and was noted to have ulceration somewhere in his urinary tract.   Followed by Dr. [name] of cardiology and Urology group at [facility]   ED Course: Patient was found to have a urinary tract infection, acute kidney injury with creatinine 2.14( baseline creatinine previously 1.1), and elevated troponin of 0.13. Bladder scan in the ED showed retention of 805 mL of urine.  Review of Systems: As per HPI otherwise 10 point review of systems negative.   Past Medical History:  Diagnosis Date  . Acid reflux   . Folliculitis   . Hiatal hernia   . Hyperlipidemia    a. pt is adamantly against statins.  . Hypertension   . Ischemia    a. Pt states he was diagnosed with "ischemia" in the 1990s but does not know further details, denies hx of heart blockage.  . Nummular dermatitis   .  Scoliosis   . Sinus bradycardia   . Stomach ulcer    a. remote ulcer in the 1990s, no hx of bleeding.    History reviewed. No pertinent surgical history.   reports that he has never smoked. He has never used smokeless tobacco. He reports that he does not drink alcohol or use drugs.  No Known Allergies  Family History  Problem Relation Age of Onset  . Heart disease Mother     Further details not reported, died at age 28  . Valvular heart disease Father     H/o MV surgery, diet at age 82    Prior to Admission medications   Medication Sig Start Date End Date Taking? Authorizing Provider  amiodarone (PACERONE) 200 MG tablet Take 1 tablet (200 mg total) by mouth daily. 05/17/16  Yes Burnell Blanks, MD  apixaban (ELIQUIS) 5 MG TABS tablet Take 1 tablet (5 mg total) by mouth 2 (two) times daily. 03/01/16  Yes Burnell Blanks, MD  atorvastatin (LIPITOR) 20 MG tablet Take 1 tablet (20 mg total) by mouth daily at 6 PM. 05/17/16  Yes Burnell Blanks, MD  ENSURE PLUS (ENSURE PLUS) LIQD Take 237 mLs by mouth daily.   Yes Historical Provider, MD  furosemide (LASIX) 20 MG tablet Take 1 tablet (20 mg total) by mouth daily. 05/17/16  Yes Burnell Blanks, MD  lisinopril (PRINIVIL,ZESTRIL) 5 MG tablet Take 1 tablet (5 mg total) by mouth 2 (two) times daily. 05/17/16  Yes Burnell Blanks, MD  metoprolol tartrate (LOPRESSOR) 25 MG tablet Take 1 tablet (25 mg total) by mouth 2 (two) times daily. 05/17/16  Yes Burnell Blanks, MD  nitroGLYCERIN (NITROSTAT) 0.4 MG SL tablet Place 1 tablet (0.4 mg total) under the tongue every 5 (five) minutes as needed for chest pain. 02/20/16  Yes Burnell Blanks, MD  tamsulosin (FLOMAX) 0.4 MG CAPS capsule Take 0.4 mg by mouth daily.   Yes Historical Provider, MD    Physical Exam: Constitutional: NAD, calm, comfortable Vitals:   05/31/16 0145 05/31/16 0230 05/31/16 0300 05/31/16 0340  BP: (!) 159/87 (!) 157/87 (!) 160/89  (!) 162/102  Pulse: 75 82 88 81  Resp: 18 (!) 28 (!) 21 18  Temp:      TempSrc:      SpO2: 96% 94% 96% 93%   Eyes: PERRL, lids and conjunctivae normal ENMT: Mucous membranes are moist. Posterior pharynx clear of any exudate or lesions.Normal dentition.  Neck: normal, supple, no masses, no thyromegaly Respiratory: clear to auscultation bilaterally, no wheezing, no crackles. Normal respiratory effort. No accessory muscle use.  Cardiovascular: Regular rate and rhythm, no murmurs / rubs / gallops. No extremity edema. 2+ pedal pulses. No carotid bruits.  Abdomen: no tenderness, no masses palpated. No hepatosplenomegaly. Bowel sounds positive.  Musculoskeletal: no clubbing / cyanosis. No joint deformity upper and lower extremities. Good ROM, no contractures. Normal muscle tone.  Skin: Abrasion to the right knee Neurologic: CN 2-12 grossly intact. Sensation intact, DTR normal. Strength 5/5 in all 4. Tremor noted of the right hand. Patient reports history of Parkinson's during exam. Psychiatric: Normal judgment and insight. Alert and oriented x 3. Normal mood.     Labs on Admission: I have personally reviewed following labs and imaging studies  CBC:  Recent Labs Lab 05/31/16 0015  WBC 9.7  NEUTROABS 7.1  HGB 13.9  HCT 41.8  MCV 94.4  PLT 481   Basic Metabolic Panel:  Recent Labs Lab 05/31/16 0015  NA 141  K 3.7  CL 103  CO2 24  GLUCOSE 121*  BUN 35*  CREATININE 2.14*  CALCIUM 9.1   GFR: Estimated Creatinine Clearance: 35.1 mL/min (A) (by C-G formula based on SCr of 2.14 mg/dL (H)). Liver Function Tests: No results for input(s): AST, ALT, ALKPHOS, BILITOT, PROT, ALBUMIN in the last 168 hours. No results for input(s): LIPASE, AMYLASE in the last 168 hours. No results for input(s): AMMONIA in the last 168 hours. Coagulation Profile: No results for input(s): INR, PROTIME in the last 168 hours. Cardiac Enzymes:  Recent Labs Lab 05/31/16 0015  TROPONINI 0.13*   BNP  (last 3 results) No results for input(s): PROBNP in the last 8760 hours. HbA1C: No results for input(s): HGBA1C in the last 72 hours. CBG: No results for input(s): GLUCAP in the last 168 hours. Lipid Profile: No results for input(s): CHOL, HDL, LDLCALC, TRIG, CHOLHDL, LDLDIRECT in the last 72 hours. Thyroid Function Tests: No results for input(s): TSH, T4TOTAL, FREET4, T3FREE, THYROIDAB in the last 72 hours. Anemia Panel: No results for input(s): VITAMINB12, FOLATE, FERRITIN, TIBC, IRON, RETICCTPCT in the last 72 hours. Urine analysis:    Component Value Date/Time   COLORURINE AMBER (A) 05/31/2016 0157   APPEARANCEUR CLOUDY (A) 05/31/2016 0157   LABSPEC 1.012 05/31/2016 0157   PHURINE 5.0 05/31/2016 0157   GLUCOSEU NEGATIVE 05/31/2016 0157   HGBUR MODERATE (A) 05/31/2016 0157   BILIRUBINUR NEGATIVE 05/31/2016 0157   KETONESUR NEGATIVE 05/31/2016 0157   PROTEINUR 100 (A) 05/31/2016 0157  NITRITE NEGATIVE 05/31/2016 0157   LEUKOCYTESUR LARGE (A) 05/31/2016 0157   Sepsis Labs: No results found for this or any previous visit (from the past 240 hour(s)).   Radiological Exams on Admission: Dg Chest Portable 1 View  Result Date: 05/31/2016 CLINICAL DATA:  Initial evaluation for acute weakness. EXAM: PORTABLE CHEST 1 VIEW COMPARISON:  Prior radiograph from 12/19/2015. FINDINGS: Moderate cardiomegaly, stable from prior. Mediastinal silhouette within normal limits. Aortic atherosclerosis noted. Lungs hypoinflated. Secondary bibasilar and perihilar vascular congestion. Linear scarring noted within the right perihilar region, similar to previous. No pulmonary edema or pleural effusion. No definite focal infiltrates. No pneumothorax. No acute osseus abnormality. IMPRESSION: 1. Shallow lung inflation with secondary bibasilar/perihilar bronchovascular crowding. No other active cardiopulmonary disease. 2. Stable cardiomegaly. 3. Aortic atherosclerosis. Electronically Signed   By: Jeannine Boga M.D.   On: 05/31/2016 00:44    EKG: Independently reviewed. Sinus rhythm with left bundle branch block  Assessment/Plan Urinary tract infection with hematuria: Acute. - Admit to telemetry bed - Follow-up urine culture - Rocephin per pharmacy  Acute kidney injury on chronic kidney disease stage III: Baseline creatinine previously 1.07-1.15, but patient presents with elevated creatinine of 2.14 and a BUN of 35.  - IVF NS at 75 ml/hr - Check renal ultrasound - Follow-up repeat BMP - Hold nephrotoxic agents  Urinary retention 2/2 BPH: Patient noted to have over 800 mL of urine present post void on bladder scan. Suspect secondary to BPH. - Continue Flomax - Place Foley catheter - will need to consult urology in a.m.   Generalized weakness with falls - Physical therapy to the patella and treat  Elevated troponins: Patient has history of elevated troponin suspect symptoms could likely exacerbated with kidney failure. Upon review of her records it appears that the patient was previously set up multiple times for left heart cath, but they were subsequent canceled. - Trend cardiac troponins - Cardiology to see in a.m.   Atrial fibrillation:CHADSVASC is at least 3. - Continue amiodarone and Eliquis  H/O systolic CHF with dilated cardiomyopathy  - strict I&Os and daily weights - hold lasix  Essential hypertension - held lasix and lisinopril secondary to acute kidney injury  Prediabetes - Monitor CBGs  - may warrant repeat hemoglobin A1c  DVT prophylaxis: Eliquis Code Status: Full  Family Communication: No family present at bedside Disposition Plan: TBD Consults called: Cards Admission status:Inpatient   Norval Morton MD [facility] Pager [phone]  If 7PM-7AM, please contact night-coverage [email or web address] Password [facility]  05/31/2016, 4:17 AM

## 2016-05-31 NOTE — Progress Notes (Addendum)
Patient seen and examined  76 y.o. male with medical history significant of HTN, HLD, A. Fib with LBBB, systolic CHF with last EF 40%, dilated cardiomyopathy, pre-diabetes; who presents with complaints of at least 3 falls  days. Patient was found to have a urinary tract infection, acute kidney injury with creatinine 2.14( baseline creatinine previously 1.1), and elevated troponin of 0.13. Bladder scan in the ED showed retention of 805 mL of urine. Patient admitted for UTI, possible sepsis, possible pyelonephritis  Assessment and plan Urinary tract infection with hematuria/urinary retention Continue telemetry, obtain blood culture - Follow-up urine culture - Rocephin per pharmacy Continue anticoagulation for now, hemoglobin stable Renal ultrasound shows right-sided hydronephrosis Will obtain CT abdomen pelvis to further evaluate REFUSED  Foley, strict I's and O's Consulted Dr Matilde Sprang for foley placement and hydronephrosis  Acute kidney injury on chronic kidney disease stage III: Baseline creatinine previously 1.07-1.15, but patient presents with elevated creatinine of 2.14 and a BUN of 35.  Continue hydration with IV fluids Renal ultrasound - Follow-up repeat BMP    Urinary retention 2/2 BPH: Patient noted to have over 800 mL of urine present post void on bladder scan. Suspect secondary to BPH. - Continue Flomax - Place Foley catheter   Transaminitis Will check CT abdomen pelvis without contrast Hepatitis panel  Generalized weakness with falls, likely secondary to the above - Physical therapy /OT-SNF  Elevated troponins in the setting of known cardiomyopathy/acute kidney injury: Patient has history of elevated troponin suspect symptoms could likely exacerbated with kidney failure.  , it appears that the patient was previously set up multiple times for left heart cath, but they were subsequent canceled. - Trend cardiac troponins, 2-D echo to rule out wall motion abnormalities -  Cardiology was consulted by EDP  Atrial fibrillation:CHADSVASC is at least 3. - Continue amiodarone and Eliquis  H/O systolic CHF with dilated cardiomyopathy , last known EF 40% - strict I&Os and daily weights - hold lasix Repeat 2-D echo  Essential hypertension - held lasix and lisinopril secondary to acute kidney injury  Prediabetes - Monitor CBGs  - may warrant repeat hemoglobin A1c  Low TSH-  free T4 1.6

## 2016-05-31 NOTE — ED Triage Notes (Signed)
Pt states c/o feeling weak and lethargic for last few days.

## 2016-05-31 NOTE — Consult Note (Signed)
Cardiology Consult    Patient ID: Stephen Copeland MRN: [id number], DOB/AGE: 1940/08/13   Admit date: 05/30/2016 Date of Consult: 05/31/2016  Primary Physician: No PCP Per Patient Reason for Consult: Elevated troponins Primary Cardiologist: Dr. [name] Requesting Provider: Dr. Tamala Julian  History of Present Illness    Stephen Copeland is a 76 y.o. 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 He also was found to have generalized weakness, shortness of breath, lower abdominal pain, fatigue, decreased appetite. He denied chest pain, nausea, vomiting or diarrhea. He is followed by Dr. [name] for cardiology and by urology. He was found to have a UTI with acute kidney injury, SCr 2.14. Bladder scan in the ED showed retention of 805 mL urine.  He was admitted to [facility] in October 2017 with rapid atrial fibrillation. He was started on amiodarone and Eliquis and converted to NSR. Echo with LVEF=40%. Outpatient stress test doen for FH of CAD and decreased EF November 2017 was a high risk study with inferior and apical thinning with a perfusion defect in the mid anterior wall and apex. We had planned a cardiac cath but he c/o blackish green stools, cough and had no chest pain so cath was delayed.  He began having blood in his urine in February 2018 and was seen in Urology. Eliquis held for several days. He was started on Flomax. His hematuria resolved. He was seen on 05/17/16 and felt great with no chest pain or dyspnea. The patient wished to delay cardiac cath at the time. He converted to sinus rhythm with amiodarone and metoprolol.  With his recent falls he has not taken any of his meds including eliquis for about a week. He has had no chest pain or tightness. He has mild DOE that is fairly long standing, he leans on a grocery cart when  shopping, with no recent worsening. He denies orthopnea, lightheadedness, dizziness, palpitations, presyncope or syncope.  EKG: Sinus rhythm at 79 bpm, Probable left atrial enlargement, Left bundle branch block (old)- no acute changes Troponins 0.13, 0.10, 0.10 CXR: 1. Shallow lung inflation with secondary bibasilar/perihilar bronchovascular crowding. No other active cardiopulmonary disease. Stable cardiomegaly. Aortic atherosclerosis. SCr 2.15 (baseline 1.07-1.21 since 12/2015), K+ 3.2, Elevated LFT's, Sed rate 34, TSH 0.282, Free T4 1.60   Past Medical History   Past Medical History:  Diagnosis Date  . Acid reflux   . Folliculitis   . Hiatal hernia   . Hyperlipidemia    a. pt is adamantly against statins.  . Hypertension   . Ischemia    a. Pt states he was diagnosed with "ischemia" in the 1990s but does not know further details, denies hx of heart blockage.  . Nummular dermatitis   . Scoliosis   . Sinus bradycardia   . Stomach ulcer    a. remote ulcer in the 1990s, no hx of bleeding.    History reviewed. No pertinent surgical history.   Allergies  No Known Allergies  Inpatient Medications    . amiodarone  200 mg Oral Daily  . apixaban  5 mg Oral BID  . cefTRIAXone (ROCEPHIN)  IV  1 g Intravenous Q24H  . feeding supplement (ENSURE ENLIVE)  237 mL Oral Daily  . metoprolol tartrate  25 mg Oral BID  . tamsulosin  0.4 mg Oral Daily    Family  History    Family History  Problem Relation Age of Onset  . Heart disease Mother     Further details not reported, died at age 62  . Valvular heart disease Father     H/o MV surgery, diet at age 80     Social History    Social History   Social History  . Marital status: Married    Spouse name: N/A  . Number of children: N/A  . Years of education: N/A   Occupational History  . Not on file.   Social History Main Topics  . Smoking status: Never Smoker  . Smokeless tobacco: Never Used  . Alcohol use No  . Drug use: No   . Sexual activity: Not on file   Other Topics Concern  . Not on file   Social History Narrative  . No narrative on file     Review of Systems   General:  No chills, fever, night sweats or weight changes.  Cardiovascular:  No chest pain, edema, orthopnea, palpitations, paroxysmal nocturnal dyspnea. Has mild baseline DOE Dermatological: No rash, lesions/masses Respiratory: No cough, dyspnea Urologic: No hematuria, dysuria Abdominal:   No nausea, vomiting, diarrhea, bright red blood per rectum, melena, or hematemesis Neurologic:  No visual changes, wkns, changes in mental status. All other systems reviewed and are otherwise negative except as noted above.  Physical Exam   Blood pressure (!) 153/83, pulse 83, temperature 98.9 F (37.2 C), temperature source Oral, resp. rate (!) 22, height 6\' 2"  (1.88 m), weight 171 lb 9.6 oz (77.8 kg), SpO2 91 %.  General: Pleasant, NAD Psych: Normal affect. Neuro: Alert and oriented X 3. Moves all extremities spontaneously. HEENT: Normal  Neck: Supple without bruits or JVD. Lungs:  Resp regular and unlabored, CTA. Heart: RRR no s3, s4, or murmurs. Abdomen: Soft, non-tender, non-distended, BS + x 4.  Extremities: No clubbing, cyanosis or edema. DP/PT/Radials 2+ and equal bilaterally.  Labs    Troponin (Point of Care Test) No results for input(s): TROPIPOC in the last 72 hours.  Recent Labs  05/31/16 0015 05/31/16 0538 05/31/16 0836  TROPONINI 0.13* 0.10* 0.10*   Lab Results  Component Value Date   WBC 9.7 05/31/2016   HGB 13.9 05/31/2016   HCT 41.8 05/31/2016   MCV 94.4 05/31/2016   PLT 196 05/31/2016    Recent Labs Lab 05/31/16 0538 05/31/16 0836  NA 141  --   K 3.2*  --   CL 100*  --   CO2 25  --   BUN 37*  --   CREATININE 2.15*  --   CALCIUM 9.2  --   PROT  --  6.3*  BILITOT  --  1.5*  ALKPHOS  --  114  ALT  --  226*  AST  --  290*  GLUCOSE 113*  --    Lab Results  Component Value Date   CHOL 130 12/20/2015     HDL 27 (L) 12/20/2015   LDLCALC 93 12/20/2015   TRIG 51 12/20/2015   No results found for: [facility]   Radiology Studies    US Renal  Result Date: 05/31/2016 CLINICAL DATA:  Acute kidney injury. EXAM: RENAL / URINARY TRACT ULTRASOUND COMPLETE COMPARISON:  None. FINDINGS: Right Kidney: Length: 12.7 cm. Echogenicity within normal limits. No mass visualized. Moderate hydronephrosis is noted. Left Kidney: Length: 12.5 cm. Echogenicity within normal limits. No mass visualized. Mild hydronephrosis is noted. Bladder: Mobile debris or sludge is noted within the dependent portion of  the urinary bladder. Left ureteral jet is noted. Right she at is not clearly identified. Mildly distended urinary bladder is noted. IMPRESSION: Moderate right hydronephrosis. Mild left hydronephrosis. Mildly distended urinary bladder is noted with left ureteral jet visualized. Mobile debris or sludge is noted in dependent portion of urinary bladder lumen. Electronically Signed   By: Marijo Conception, M.D.   On: 05/31/2016 08:50   Dg Chest Portable 1 View  Result Date: 05/31/2016 CLINICAL DATA:  Initial evaluation for acute weakness. EXAM: PORTABLE CHEST 1 VIEW COMPARISON:  Prior radiograph from 12/19/2015. FINDINGS: Moderate cardiomegaly, stable from prior. Mediastinal silhouette within normal limits. Aortic atherosclerosis noted. Lungs hypoinflated. Secondary bibasilar and perihilar vascular congestion. Linear scarring noted within the right perihilar region, similar to previous. No pulmonary edema or pleural effusion. No definite focal infiltrates. No pneumothorax. No acute osseus abnormality. IMPRESSION: 1. Shallow lung inflation with secondary bibasilar/perihilar bronchovascular crowding. No other active cardiopulmonary disease. 2. Stable cardiomegaly. 3. Aortic atherosclerosis. Electronically Signed   By: Jeannine Boga M.D.   On: 05/31/2016 00:44    EKG & Cardiac Imaging    EKG: Sinus rhythm at 79 bpm, Probable left  atrial enlargement, Left bundle branch block (old)- no acute changes  Echocardiogram:  12/20/2015 Study Conclusions  - Left ventricle: The cavity size was normal. There was moderate   concentric hypertrophy. Systolic function was moderately reduced.   The estimated ejection fraction was 40%. Diffuse hypokinesis.   There is severe hypokinesis of the basal-midinferolateral,   inferior, and inferoseptal myocardium. - Ventricular septum: Septal motion showed moderate paradox. These   changes are consistent with intraventricular conduction delay. - Aortic valve: Poorly visualized. Trileaflet; normal thickness,   mildly calcified leaflets. - Mitral valve: There was mild regurgitation. - Left atrium: The atrium was severely dilated. - Right atrium: The atrium was mildly dilated. - Pulmonic valve: There was mild regurgitation. - Pulmonary arteries: PA peak pressure: 35 mm Hg (S). _________________________________________________________________________________________   Myocardial perfusion imaging 01/17/2016 Study Highlights    Nuclear stress EF: 34%.  Defect 1: There is a medium defect of moderate severity present in the mid inferior and apex location.  This is a high risk study.  The left ventricular ejection fraction is moderately decreased (30-44%).   High risk stress nuclear study with inferior and apical thinning; no ischemia; EF 34 with global hypokinesis and moderate LVE; findings suggestive of NICM; study high risk due to reduced LV function.       Assessment & Plan    Elevated troponins -Troponins 0.13, 0.10, 0.10  -EKG NSR with LBBB, no acute changes -CXR: No other active cardiopulmonary disease. Stable cardiomegaly. -Pt had nuclear stress test for FH of CAD and decreased EF in 01/2016: High risk stress nuclear study with inferior and apical thinning; no ischemia; EF 34% with global hypokinesis and moderate LVE; findings suggestive of NICM; study high risk due to  reduced LV function. Cardiac cath was planned, but deferred due to hematuria. He was seen on 05/17/16 to discuss and was feeling well without chest pain, so pt elected not to proceed with cath at that time. -Mildly elevated troponins in flat pattern likely related to UTI and acute kidney injury. Pt is not having any chest pain or significant dyspnea.  Advise to treat acute UTI and kidney injury and no further cardiac workup at this time. Follow up as outpatient.  Acute kidney injury -In setting of UTI and urinary retention. -IV fluids and management per primary team. Pharmacy consulted for  antibiotic therapy. -Follow Bmet  Urinary retention -Thought to be secondary to BPH.  -Flomax and management per primary team. They plan to consult urology. Renal US shows mod right hydronephrosis, mild left hydronephrosis, distended bladder and debris or sludge.  Atrial fibrillation -PAF, has been maintaining sinus rhythm on amiodarone 200 mg and metoprolol tartrate 25 mg bid. AST and ALT are now elevated. Hold amiodarone temporarily while assessing liver function.  -CHA2DS2/VAS Stroke Risk Points  4 (HTN, Age (2), CHF). He is anticoagulated with Eliquis 5 mg Bid. Pt had been off all of his meds, including eliquis for about a week PTA due to his weakness and falling. Continue Eliquis for now. Can hold if needed for procedures.      Chronic systolic dysfunction -EF 06% by echo in 12/2015 -Home meds include lasix 20 mg daily, lisinopril 5 mg daily, metoprolol tartrate 25 mg bid -lasix and lisinopril on hold due to acute kidney injury -Pt receiving IV fluids. Monitor for volume overload. Strict I&O and daily weights. Clinically does not appear fluid overloaded.    HTN -lasix and lisinopril on hold secondary to acute kidney injury -BP's elevated 143-170/84-102 -Continue metoprolol. Add hydralazine 25 mg TID with prn IV hydralazine  Hypokalemia -K+ 3.2. IV fluids NS with 40 mEq KCl per Primary  team. -Monitor BMet  Transaminitis  -AST 290, ALT 226- May be related to systemic illness. -CT abdomen/pelvis and hepatitis panel ordered by IM, -Hold amiodarone temporarily while assessing liver function.   Tildon Husky, NP-C 05/31/2016, 12:38 PM Pager: [phone]   I have seen, examined and evaluated the patient this PM along with Ms. Phylliss Bob, NP.  After reviewing all the available data and chart, we discussed the patients laboratory, study & physical findings as well as symptoms in detail. I agree with her findings, examination as well as impression recommendations as per our discussion.    Patient with no documented evidence of coronary disease but evidence of mild cardiomyopathy presents with likely UTI with urinary retention. He does have a history of atrial fibrillation but has been stable on amiodarone. He has been anticoagulated with ELIQUIS.  He was admitted for progressive weakness and falls. He is to the point where he could barely stand up. Likely all a result of his UTI. Troponin levels were elevated, but in the absence of any active anginal symptoms, this is probably likely just mild troponin leak from systemic illness. This goes along with the elevated LFTs. I do agree with temporarily holding amiodarone until we reassess his liver functions.  Otherwise I would not recommend any further cardiac evaluation. Holding lisinopril for acute renal insufficiency, we have started hydralazine temporarily to replace afterload reduction -- both as standing by mouth dose and when necessary IV.  We will follow along, but no active cardiac recommendations.  If no invasive procedures or consider, would probably restart ELIQUIS, however if there is a potential for an invasive procedure, I would hold ELIQUIS for now and restart when stable.     Glenetta Hew, M.D., M.S. Interventional Cardiologist   Pager # [phone] Phone # [phone] [address] [place], [place] [address]

## 2016-05-31 NOTE — Progress Notes (Signed)
Pharmacy Antibiotic Note  Stephen Copeland is a 76 y.o. male admitted on 05/30/2016 with weakness.  Pharmacy has been consulted for Ceftriaxone dosing for UTI. WBC WNL. Abnormal U/A.   Plan: -Ceftriaxone 1g IV q24h -F/U urine culture for directed therapy  Temp (24hrs), Avg:98.9 F (37.2 C), Min:98.9 F (37.2 C), Max:98.9 F (37.2 C)   Recent Labs Lab 05/31/16 0015  WBC 9.7  CREATININE 2.14*    Estimated Creatinine Clearance: 35.1 mL/min (A) (by C-G formula based on SCr of 2.14 mg/dL (H)).    No Known Allergies   Randie, [place] 05/31/2016 5:29 AM

## 2016-05-31 NOTE — ED Notes (Signed)
Bladder scan 803.

## 2016-05-31 NOTE — ED Notes (Signed)
Pt states that he is too weak to sit or stand for orthostatic vitals.

## 2016-05-31 NOTE — ED Notes (Signed)
Pt aware that a urine sample is needed.  

## 2016-05-31 NOTE — ED Notes (Signed)
Christy Gentles, MD aware of Troponin of 0.13

## 2016-05-31 NOTE — Progress Notes (Signed)
Initial Nutrition Assessment  DOCUMENTATION CODES:   Not applicable  INTERVENTION:    Continue Ensure Enlive po BID, each supplement provides 350 kcal and 20 grams of protein  NUTRITION DIAGNOSIS:   Increased nutrient needs related to acute illness as evidenced by estimated needs  GOAL:   Patient will meet greater than or equal to 90% of their needs  MONITOR:   PO intake, Supplement acceptance, Labs, Weight trends, I & O's  REASON FOR ASSESSMENT:   Malnutrition Screening Tool  ASSESSMENT:   76 yo Male with PMH of HTN, Scoliosis, A-fib, and CHF; presented with 3 recent falls and UTI.  RD spoke with pt at bedside. Reports a good appetite.  Was eating well PTA.  His wife likes to cook. He states everything tastes "sweet," which sometimes decreases his appetite.  Sometimes drinks Ensure and/or Boost supplements at home. Medications reviewed.  Ensure Enlive ordered BID. Labs reviewed.  Potassium 3.2 (L).  Nutrition focused physical exam completed.  No muscle or subcutaneous fat depletion noticed.  Diet Order:  Diet Heart Room service appropriate? Yes; Fluid consistency: Thin  Skin:  Reviewed, no issues  Last BM:  3/27  Height:   Ht Readings from Last 1 Encounters:  05/31/16 6\' 2"  (1.88 m)   Weight:   Wt Readings from Last 1 Encounters:  05/31/16 171 lb 9.6 oz (77.8 kg)   Wt Readings from Last 10 Encounters:  05/31/16 171 lb 9.6 oz (77.8 kg)  05/17/16 183 lb 3.2 oz (83.1 kg)  02/08/16 185 lb (83.9 kg)  01/20/16 182 lb 1.9 oz (82.6 kg)  01/17/16 182 lb (82.6 kg)  12/29/15 182 lb 1.9 oz (82.6 kg)  12/22/15 183 lb 8 oz (83.2 kg)   Ideal Body Weight:  86.3 kg  BMI:  Body mass index is 22.03 kg/m.  Estimated Nutritional Needs:   Kcal:  1800-2000  Protein:  90-100 gm  Fluid:  1.8-2.0 L  EDUCATION NEEDS:   No education needs identified at this time  Arthur Holms, RD, LDN Pager #: [phone] After-Hours Pager #: [phone]

## 2016-05-31 NOTE — ED Notes (Signed)
Pulse ox readjusted

## 2016-05-31 NOTE — ED Notes (Signed)
No addl lab draw pt moved to inpatient floor

## 2016-05-31 NOTE — ED Notes (Signed)
Report called to 3W.

## 2016-05-31 NOTE — Evaluation (Signed)
Physical Therapy Evaluation Patient Details Name: Stephen Copeland MRN: [id number] DOB: Jun 01, 1940 Today's Date: 05/31/2016   History of Present Illness  pt presents with UTI and 3 recent falls.  pt with hx of HTN, Scoliosis, A-fib, and CHF.    Clinical Impression  Pt generally deconditioned and requires A for all aspects of mobility.  Pt most limited by pain in Bil knees with L worse than R.  Pt on 2L O2 throughout session with sats remaining in low 90s.  Pt indicates his wife uses a RW and is unable to provide any physical help for him at home.  Feel pt would need SNF level of care to maximize independence prior to returning to home.      Follow Up Recommendations SNF    Equipment Recommendations  None recommended by PT    Recommendations for Other Services       Precautions / Restrictions Precautions Precautions: Fall Restrictions Weight Bearing Restrictions: No      Mobility  Bed Mobility Overal bed mobility: Needs Assistance Bed Mobility: Supine to Sit     Supine to sit: Mod assist;+2 for physical assistance     General bed mobility comments: pt needs A with Bil LEs and steadying trunk with coming up to sitting.    Transfers Overall transfer level: Needs assistance Equipment used: Rolling walker (2 wheeled) Transfers: Sit to/from [facility] Sit to Stand: Mod assist;+2 physical assistance Stand pivot transfers: Min assist;+2 physical assistance       General transfer comment: Cues for UE use and A with power up to standing.  Cues for movement through pivot to recliner.  pt tends to sit prematurely despite cues.    Ambulation/Gait                Stairs            Wheelchair Mobility    Modified Rankin (Stroke Patients Only)       Balance Overall balance assessment: Needs assistance;History of Falls Sitting-balance support: Single extremity supported;No upper extremity supported;Feet supported Sitting balance-Leahy Scale:  Fair     Standing balance support: Single extremity supported;Bilateral upper extremity supported;During functional activity Standing balance-Leahy Scale: Poor Standing balance comment: pt was able to maintain standing to use urinal, but required MinA to do so.                               Pertinent Vitals/Pain Pain Assessment: Faces Faces Pain Scale: Hurts even more Pain Location: Bil knees, L worse than R Pain Descriptors / Indicators: Grimacing;Guarding;Sore Pain Intervention(s): Monitored during session;Premedicated before session;Repositioned    Home Living Family/patient expects to be discharged to:: Private residence Living Arrangements: Spouse/significant other Available Help at Discharge: Family;Available 24 hours/day (S only.) Type of Home: House           Additional Comments: pt indicates his wife is older than him and that she uses a RW.      Prior Function Level of Independence: Independent         Comments: However admits to having difficulty doing things at home.       Hand Dominance        Extremity/Trunk Assessment   Upper Extremity Assessment Upper Extremity Assessment: Generalized weakness    Lower Extremity Assessment Lower Extremity Assessment: Generalized weakness;RLE deficits/detail;LLE deficits/detail RLE Deficits / Details: AROM WFL, strength grossly 4/5, Sensation intact.  R knee pain and edema from fall.  RLE Coordination: decreased fine motor;decreased gross motor LLE Deficits / Details: AROM and strength limited by pain in knee and ankle.  pt Sensation intact.  Edema noted at knee worse than ankle.   LLE: Unable to fully assess due to pain LLE Coordination: decreased gross motor;decreased fine motor    Cervical / Trunk Assessment Cervical / Trunk Assessment: Kyphotic  Communication   Communication: No difficulties  Cognition Arousal/Alertness: Awake/alert Behavior During Therapy: WFL for tasks  assessed/performed Overall Cognitive Status: Within Functional Limits for tasks assessed                                        General Comments      Exercises     Assessment/Plan    PT Assessment Patient needs continued PT services  PT Problem List Decreased strength;Decreased range of motion;Decreased activity tolerance;Decreased balance;Decreased mobility;Decreased coordination;Decreased knowledge of use of DME;Pain       PT Treatment Interventions DME instruction;Gait training;Stair training;Functional mobility training;Therapeutic activities;Therapeutic exercise;Balance training;Patient/family education    PT Goals (Current goals can be found in the Care Plan section)  Acute Rehab PT Goals Patient Stated Goal: Stop falling PT Goal Formulation: With patient Time For Goal Achievement: 06/14/16 Potential to Achieve Goals: Good    Frequency Min 3X/week   Barriers to discharge Decreased caregiver support      Co-evaluation               End of Session Equipment Utilized During Treatment: Gait belt Activity Tolerance: Patient limited by pain;Patient limited by fatigue Patient left: in chair;with call bell/phone within reach;with chair alarm set;with nursing/sitter in room Nurse Communication: Mobility status PT Visit Diagnosis: Unsteadiness on feet (R26.81);Pain Pain - Right/Left: Left Pain - part of body: Knee    Time: 0900-0929 PT Time Calculation (min) (ACUTE ONLY): 29 min   Charges:   PT Evaluation $PT Eval Moderate Complexity: 1 Procedure PT Treatments $Gait Training: 8-22 mins   PT G CodesCatarina Hartshorn, [place]  [phone] 05/31/2016, 9:57 AM

## 2016-05-31 NOTE — ED Triage Notes (Signed)
Pt presented to ED post fall. Cardiac Hx. No c/o chest pain. Abnormal EKG. Initial EKG showed ST elevation. EKG at admission shows left bundle branch block. 324mg  of aspirin given by EMS.

## 2016-06-01 ENCOUNTER — Inpatient Hospital Stay (HOSPITAL_COMMUNITY): Payer: Medicare Other

## 2016-06-01 DIAGNOSIS — I5042 Chronic combined systolic (congestive) and diastolic (congestive) heart failure: Secondary | ICD-10-CM

## 2016-06-01 DIAGNOSIS — E860 Dehydration: Secondary | ICD-10-CM

## 2016-06-01 DIAGNOSIS — I509 Heart failure, unspecified: Secondary | ICD-10-CM

## 2016-06-01 DIAGNOSIS — N3000 Acute cystitis without hematuria: Secondary | ICD-10-CM

## 2016-06-01 DIAGNOSIS — R748 Abnormal levels of other serum enzymes: Secondary | ICD-10-CM

## 2016-06-01 LAB — COMPREHENSIVE METABOLIC PANEL
ALBUMIN: 2.8 g/dL — AB (ref 3.5–5.0)
ALT: 280 U/L — ABNORMAL HIGH (ref 17–63)
AST: 311 U/L — AB (ref 15–41)
Alkaline Phosphatase: 94 U/L (ref 38–126)
Anion gap: 9 (ref 5–15)
BUN: 39 mg/dL — AB (ref 6–20)
CHLORIDE: 105 mmol/L (ref 101–111)
CO2: 25 mmol/L (ref 22–32)
Calcium: 8.6 mg/dL — ABNORMAL LOW (ref 8.9–10.3)
Creatinine, Ser: 1.89 mg/dL — ABNORMAL HIGH (ref 0.61–1.24)
GFR calc Af Amer: 38 mL/min — ABNORMAL LOW (ref >60)
GFR, EST NON AFRICAN AMERICAN: 33 mL/min — AB (ref >60)
GLUCOSE: 114 mg/dL — AB (ref 65–99)
POTASSIUM: 4.2 mmol/L (ref 3.5–5.1)
Sodium: 139 mmol/L (ref 135–145)
Total Bilirubin: 1.2 mg/dL (ref 0.3–1.2)
Total Protein: 5.6 g/dL — ABNORMAL LOW (ref 6.5–8.1)

## 2016-06-01 LAB — CBC
HCT: 36.8 % — ABNORMAL LOW (ref 39.0–52.0)
Hemoglobin: 12.4 g/dL — ABNORMAL LOW (ref 13.0–17.0)
MCH: 31.8 pg (ref 26.0–34.0)
MCHC: 33.7 g/dL (ref 30.0–36.0)
MCV: 94.4 fL (ref 78.0–100.0)
Platelets: 180 10*3/uL (ref 150–400)
RBC: 3.9 MIL/uL — ABNORMAL LOW (ref 4.22–5.81)
RDW: 14.4 % (ref 11.5–15.5)
WBC: 7.2 10*3/uL (ref 4.0–10.5)

## 2016-06-01 LAB — ECHOCARDIOGRAM COMPLETE
CHL CUP PV REG GRAD DIAS: 8 mmHg
CHL CUP RV SYS PRESS: 43 mmHg
E decel time: 423 msec
E/e' ratio: 14.73
FS: 26 % — AB (ref 28–44)
Height: 74 in
IVS/LV PW RATIO, ED: 1.03
LA diam end sys: 47 mm
LA diam index: 2.35 cm/m2
LA vol A4C: 70.1 ml
LA vol index: 44.2 mL/m2
LASIZE: 47 mm
LAVOL: 88.5 mL
LDCA: 4.15 cm2
LV E/e' medial: 14.73
LV TDI E'LATERAL: 4.61
LV e' LATERAL: 4.61 cm/s
LVEEAVG: 14.73
LVOT VTI: 26 cm
LVOT diameter: 23 mm
LVOTPV: 109 cm/s
LVOTSV: 108 mL
MV Dec: 423
MV pk A vel: 56.8 m/s
MVPKEVEL: 67.9 m/s
PV Reg vel dias: 143 cm/s
PW: 15.3 mm — AB (ref 0.6–1.1)
Reg peak vel: 317 cm/s
TDI e' medial: 5.01
TRMAXVEL: 317 cm/s
Weight: 2734.4 oz

## 2016-06-01 LAB — HEMOGLOBIN A1C
Hgb A1c MFr Bld: 5.7 % — ABNORMAL HIGH (ref 4.8–5.6)
Mean Plasma Glucose: 117 mg/dL

## 2016-06-01 LAB — CK: CK TOTAL: 260 U/L (ref 49–397)

## 2016-06-01 MED ORDER — GUAIFENESIN-DM 100-10 MG/5ML PO SYRP
5.0000 mL | ORAL_SOLUTION | ORAL | Status: DC | PRN
  Administered 2016-06-01 – 2016-06-02 (×4): 5 mL via ORAL
  Filled 2016-06-01 (×4): qty 5

## 2016-06-01 NOTE — Progress Notes (Signed)
[facility] PROGRESS NOTE  Stephen Copeland LAG:[id number] DOB: [birth year] DOA: 05/30/2016   PCP: No PCP Per Patient     Assessment/Plan: Principal Problem:   UTI (urinary tract infection) Active Problems:   Atrial fibrillation ([place])   Hypertension   Chronic systolic CHF (congestive heart failure) (HCC)   Acute urinary retention   Acute kidney injury superimposed on chronic kidney disease (HCC)   Generalized weakness   Falls    75 y.o.malewith medical history significant of HTN, HLD, A. Fib with LBBB, systolic CHF with last EF 40%,dilated cardiomyopathy, pre-diabetes; who presents with complaints of at least 3 falls days.Patient was found to have a urinary tract infection, acute kidney injury with creatinine 2.14(baseline creatinine previously 1.1), and elevated troponin of 0.13. Bladder scan in the ED showed retention of 805 mL of urine. Patient admitted for UTI, possible sepsis, possible pyelonephritis  Assessment and plan Urinary tract infection with hematuria/urinary retention Continue telemetry,  blood culture no growth so far Urine culture insignificant growth Continue Rocephin per pharmacy Continue anticoagulation for now, hemoglobin stable Renal ultrasound shows right-sided hydronephrosis  CT abdomen pelvis  , shows enlargement of the prostrate compensated hydroureteronephrosis 2145 cc out Consulted Dr Matilde Sprang for foley placement and hydronephrosis, he advised RN to attempt Foley based on cystoscopy results  Acute kidney injury on chronic kidney disease stage ZYY:QMGNOIBB creatinine previously 1.07-1.15, but patient presents with elevated creatinine of 2.14 and a BUN of 35. Creatinine improving after Foley placement Follow BMP    Urinary retention 2/2 BPH: Patient noted to have over 800 mL of urine present post void on bladder scan.Suspect secondary to BPH. - Continue Flomax - Place Foley catheter   Transaminitis  Gallstones/hyperdense  sludge dependent in the gallbladder. Numerous small stones in the distal common bile duct. No evidence of ductal dilatation. Acute Hepatitis panel  Generalized weakness with falls, likely secondary to the above - Physical therapy /OT-SNF  Elevated troponins in the setting of known cardiomyopathy/acute kidney injury: Patient has history of elevated troponin suspect symptoms could likely exacerbated with kidney failure.  , it appears that the patient was previously set up multiple times for left heart cath,but they were subsequent canceled.  Trend cardiac troponins, 2-D echo to rule out wall motion abnormalities , pericardial effusion Troponin levels were elevated, but in the absence of any active anginal symptoms, this is probably likely just mild troponin leak from systemic illness. This goes along with the elevated LFTs. Hold amiodarone until we reassess his liver functions Hold metoprolol in the setting of bradycardia   Atrial fibrillation:CHADSVASC is at least 3. Continue Eliquis  H/O systolic CHF with dilated cardiomyopathy/moderate pericardial effusion on CT  , last known EF 40% Repeat 2-D echo pending hold lasix, hold metoprolol given bradycardia    Essential hypertension Continue to hold lasix and lisinopril secondary to acute kidney injury  Prediabetes - Monitor CBGs  Hemoglobin A1c 5.7  Low TSH-  free T4 1.6   DVT prophylaxsis eliquis  Code Status:  Full code     Family Communication: Discussed in detail with the patient, all imaging results, lab results explained to the patient   Disposition Plan:   Continue telemetry      Consultants:  Cardiology  Procedures:  None  Antibiotics: Anti-infectives    Start     Dose/Rate Route Frequency Ordered Stop   05/31/16 2200  cefTRIAXone (ROCEPHIN) 1 g in dextrose 5 % 50 mL IVPB     1 g 100  mL/hr over 30 Minutes Intravenous Every 24 hours 05/31/16 0530     05/31/16 0300  cefTRIAXone (ROCEPHIN) 1 g in  dextrose 5 % 50 mL IVPB     1 g 100 mL/hr over 30 Minutes Intravenous  Once 05/31/16 0249 05/31/16 0341         HPI/Subjective: Bradycardic this morning, asx , BP ok  Objective: Vitals:   05/31/16 1405 05/31/16 2016 06/01/16 0500 06/01/16 0748  BP:  (!) 141/83 (!) 112/103 117/61  Pulse:  (!) 55    Resp:  18    Temp: 98.2 F (36.8 C)  98.1 F (36.7 C)   TempSrc: Oral Axillary Oral   SpO2:  100% 94% 97%  Weight:   77.5 kg (170 lb 14.4 oz)   Height:        Intake/Output Summary (Last 24 hours) at 06/01/16 0802 Last data filed at 06/01/16 0507  Gross per 24 hour  Intake           2337.5 ml  Output             2145 ml  Net            192.5 ml    Exam:  Examination:  General exam: Appears calm and comfortable  Respiratory system: Clear to auscultation. Respiratory effort normal. Cardiovascular system: S1 & S2 heard, RRR. No JVD, murmurs, rubs, gallops or clicks. No pedal edema. Gastrointestinal system: Abdomen is nondistended, soft and nontender. No organomegaly or masses felt. Normal bowel sounds heard. Central nervous system: Alert and oriented. No focal neurological deficits. Extremities: Symmetric 5 x 5 power. Skin: No rashes, lesions or ulcers Psychiatry: Judgement and insight appear normal. Mood & affect appropriate.     Data Reviewed: I have personally reviewed following labs and imaging studies  Micro Results Recent Results (from the past 240 hour(s))  Culture, blood (Routine X 2) w Reflex to ID Panel     Status: None (Preliminary result)   Collection Time: 05/31/16  8:49 AM  Result Value Ref Range Status   Specimen Description BLOOD RIGHT WRIST  Final   Special Requests BOTTLES DRAWN AEROBIC AND ANAEROBIC  5CC  Final   Culture NO GROWTH < 12 HOURS  Final   Report Status PENDING  Incomplete  Culture, blood (Routine X 2) w Reflex to ID Panel     Status: None (Preliminary result)   Collection Time: 05/31/16  8:54 AM  Result Value Ref Range Status    Specimen Description BLOOD RIGHT HAND  Final   Special Requests BOTTLES DRAWN AEROBIC AND ANAEROBIC  5CC  Final   Culture NO GROWTH < 12 HOURS  Final   Report Status PENDING  Incomplete    Radiology Reports Ct Abdomen Pelvis Wo Contrast  Result Date: 05/31/2016 CLINICAL DATA:  Hydronephrosis.  Generalized weakness. EXAM: CT ABDOMEN AND PELVIS WITHOUT CONTRAST TECHNIQUE: Multidetector CT imaging of the abdomen and pelvis was performed following the standard protocol without IV contrast. COMPARISON:  Ultrasound same day FINDINGS: Lower chest: Linear atelectasis in both lower lobes. Moderate pericardial effusion. Cardiomegaly. No pleural effusion. Small hiatal hernia. Hepatobiliary: Liver parenchyma is normal. Small calcified stones or hyperdense sludge dependent within the gallbladder. There are multiple stones in the distal common bile duct without ductal dilatation. Pancreas: Normal.  Multiple stones in the distal common bile duct. Spleen: Normal Adrenals/Urinary Tract: Adrenal glands are normal. Left kidney is normal without contrast. Tiny exit fix cyst lower pole. Right kidney shows no significant parenchymal finding. There  is fullness of the renal collecting system an extrarenal pelvis. The proximal ureter is tortuous in the ureter is then dilated all the way to the bladder. No evidence of stone. The prostate gland is markedly enlarged. There are a few small bladder diverticulae. The bladder appears thick walled. Stomach/Bowel: No acute or significant bowel finding. Scattered colonic diverticulae without diverticulitis. Vascular/Lymphatic: Aortic atherosclerosis. No aneurysm. IVC is normal. No retroperitoneal mass or lymphadenopathy. Reproductive: None other significant. Other: Small inguinal hernia on the right containing fat. Musculoskeletal: Chronic lumbar degenerative changes. IMPRESSION: Moderate pericardial effusion. Cardiomegaly. Linear atelectasis or scarring at the lung bases.  Gallstones/hyperdense sludge dependent in the gallbladder. Numerous small stones in the distal common bile duct. No evidence of ductal dilatation. Marked enlargement of the prostate. 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 Chronic appearing compensated hydroureteronephrosis on the right, presumably secondary to the chronic bladder disease. No evidence of urinary tract stone disease. Aortic atherosclerosis. Electronically Signed   By: Nelson Chimes M.D.   On: 05/31/2016 16:34   US Renal  Result Date: 05/31/2016 CLINICAL DATA:  Acute kidney injury. EXAM: RENAL / URINARY TRACT ULTRASOUND COMPLETE COMPARISON:  None. FINDINGS: Right Kidney: Length: 12.7 cm. Echogenicity within normal limits. No mass visualized. Moderate hydronephrosis is noted. Left Kidney: Length: 12.5 cm. Echogenicity within normal limits. No mass visualized. Mild hydronephrosis is noted. Bladder: Mobile debris or sludge is noted within the dependent portion of the urinary bladder. Left ureteral jet is noted. Right she at is not clearly identified. Mildly distended urinary bladder is noted. IMPRESSION: Moderate right hydronephrosis. Mild left hydronephrosis. Mildly distended urinary bladder is noted with left ureteral jet visualized. Mobile debris or sludge is noted in dependent portion of urinary bladder lumen. Electronically Signed   By: Marijo Conception, M.D.   On: 05/31/2016 08:50   Dg Chest Portable 1 View  Result Date: 05/31/2016 CLINICAL DATA:  Initial evaluation for acute weakness. EXAM: PORTABLE CHEST 1 VIEW COMPARISON:  Prior radiograph from 12/19/2015. FINDINGS: Moderate cardiomegaly, stable from prior. Mediastinal silhouette within normal limits. Aortic atherosclerosis noted. Lungs hypoinflated. Secondary bibasilar and perihilar vascular congestion. Linear scarring noted within the right perihilar region, similar to previous. No pulmonary edema or pleural effusion. No  definite focal infiltrates. No pneumothorax. No acute osseus abnormality. IMPRESSION: 1. Shallow lung inflation with secondary bibasilar/perihilar bronchovascular crowding. No other active cardiopulmonary disease. 2. Stable cardiomegaly. 3. Aortic atherosclerosis. Electronically Signed   By: Jeannine Boga M.D.   On: 05/31/2016 00:44     CBC  Recent Labs Lab 05/31/16 0015 06/01/16 0342  WBC 9.7 7.2  HGB 13.9 12.4*  HCT 41.8 36.8*  PLT 196 180  MCV 94.4 94.4  MCH 31.4 31.8  MCHC 33.3 33.7  RDW 14.1 14.4  LYMPHSABS 0.9  --   MONOABS 1.7*  --   EOSABS 0.0  --   BASOSABS 0.1  --     Chemistries   Recent Labs Lab 05/31/16 0015 05/31/16 0538 05/31/16 0836 05/31/16 1444 06/01/16 0342  NA 141 141  --   --  139  K 3.7 3.2*  --   --  4.2  CL 103 100*  --   --  105  CO2 24 25  --   --  25  GLUCOSE 121* 113*  --   --  114*  BUN 35* 37*  --   --  39*  CREATININE 2.14* 2.15*  --   --  1.89*  CALCIUM 9.1 9.2  --   --  8.6*  AST  --   --  290* 324* 311*  ALT  --   --  226* 268* 280*  ALKPHOS  --   --  114 106 94  BILITOT  --   --  1.5* 0.8 1.2   ------------------------------------------------------------------------------------------------------------------ estimated creatinine clearance is 37 mL/min (A) (by C-G formula based on SCr of 1.89 mg/dL (H)). ------------------------------------------------------------------------------------------------------------------  Recent Labs  05/31/16 0015  HGBA1C 5.7*   ------------------------------------------------------------------------------------------------------------------ No results for input(s): CHOL, HDL, LDLCALC, TRIG, CHOLHDL, LDLDIRECT in the last 72 hours. ------------------------------------------------------------------------------------------------------------------  Recent Labs  05/31/16 0015  TSH 0.282*    ------------------------------------------------------------------------------------------------------------------ No results for input(s): VITAMINB12, FOLATE, FERRITIN, TIBC, IRON, RETICCTPCT in the last 72 hours.  Coagulation profile No results for input(s): INR, PROTIME in the last 168 hours.  No results for input(s): DDIMER in the last 72 hours.  Cardiac Enzymes  Recent Labs Lab 05/31/16 0538 05/31/16 0836 05/31/16 1756  TROPONINI 0.10* 0.10* 0.12*   ------------------------------------------------------------------------------------------------------------------ Invalid input(s): POCBNP   CBG: No results for input(s): GLUCAP in the last 168 hours.     Studies: Ct Abdomen Pelvis Wo Contrast  Result Date: 05/31/2016 CLINICAL DATA:  Hydronephrosis.  Generalized weakness. EXAM: CT ABDOMEN AND PELVIS WITHOUT CONTRAST TECHNIQUE: Multidetector CT imaging of the abdomen and pelvis was performed following the standard protocol without IV contrast. COMPARISON:  Ultrasound same day FINDINGS: Lower chest: Linear atelectasis in both lower lobes. Moderate pericardial effusion. Cardiomegaly. No pleural effusion. Small hiatal hernia. Hepatobiliary: Liver parenchyma is normal. Small calcified stones or hyperdense sludge dependent within the gallbladder. There are multiple stones in the distal common bile duct without ductal dilatation. Pancreas: Normal.  Multiple stones in the distal common bile duct. Spleen: Normal Adrenals/Urinary Tract: Adrenal glands are normal. Left kidney is normal without contrast. Tiny exit fix cyst lower pole. Right kidney shows no significant parenchymal finding. There is fullness of the renal collecting system an extrarenal pelvis. The proximal ureter is tortuous in the ureter is then dilated all the way to the bladder. No evidence of stone. The prostate gland is markedly enlarged. There are a few small bladder diverticulae. The bladder appears thick walled.  Stomach/Bowel: No acute or significant bowel finding. Scattered colonic diverticulae without diverticulitis. Vascular/Lymphatic: Aortic atherosclerosis. No aneurysm. IVC is normal. No retroperitoneal mass or lymphadenopathy. Reproductive: None other significant. Other: Small inguinal hernia on the right containing fat. Musculoskeletal: Chronic lumbar degenerative changes. IMPRESSION: Moderate pericardial effusion. Cardiomegaly. Linear atelectasis or scarring at the lung bases. Gallstones/hyperdense sludge dependent in the gallbladder. Numerous small stones in the distal common bile duct. No evidence of ductal dilatation. Marked enlargement of the prostate. 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 Chronic appearing compensated hydroureteronephrosis on the right, presumably secondary to the chronic bladder disease. No evidence of urinary tract stone disease. Aortic atherosclerosis. Electronically Signed   By: Nelson Chimes M.D.   On: 05/31/2016 16:34   US Renal  Result Date: 05/31/2016 CLINICAL DATA:  Acute kidney injury. EXAM: RENAL / URINARY TRACT ULTRASOUND COMPLETE COMPARISON:  None. FINDINGS: Right Kidney: Length: 12.7 cm. Echogenicity within normal limits. No mass visualized. Moderate hydronephrosis is noted. Left Kidney: Length: 12.5 cm. Echogenicity within normal limits. No mass visualized. Mild hydronephrosis is noted. Bladder: Mobile debris or sludge is noted within the dependent portion of the urinary bladder. Left ureteral jet is noted. Right she at is not clearly identified. Mildly distended urinary bladder is noted. IMPRESSION: Moderate right hydronephrosis. Mild left hydronephrosis. Mildly distended  urinary bladder is noted with left ureteral jet visualized. Mobile debris or sludge is noted in dependent portion of urinary bladder lumen. Electronically Signed   By: Marijo Conception, M.D.   On: 05/31/2016 08:50   Dg Chest Portable 1  View  Result Date: 05/31/2016 CLINICAL DATA:  Initial evaluation for acute weakness. EXAM: PORTABLE CHEST 1 VIEW COMPARISON:  Prior radiograph from 12/19/2015. FINDINGS: Moderate cardiomegaly, stable from prior. Mediastinal silhouette within normal limits. Aortic atherosclerosis noted. Lungs hypoinflated. Secondary bibasilar and perihilar vascular congestion. Linear scarring noted within the right perihilar region, similar to previous. No pulmonary edema or pleural effusion. No definite focal infiltrates. No pneumothorax. No acute osseus abnormality. IMPRESSION: 1. Shallow lung inflation with secondary bibasilar/perihilar bronchovascular crowding. No other active cardiopulmonary disease. 2. Stable cardiomegaly. 3. Aortic atherosclerosis. Electronically Signed   By: Jeannine Boga M.D.   On: 05/31/2016 00:44      Lab Results  Component Value Date   HGBA1C 5.7 (H) 05/31/2016   HGBA1C 5.9 (H) 12/19/2015   Lab Results  Component Value Date   LDLCALC 93 12/20/2015   CREATININE 1.89 (H) 06/01/2016       Scheduled Meds: . apixaban  5 mg Oral BID  . cefTRIAXone (ROCEPHIN)  IV  1 g Intravenous Q24H  . feeding supplement (ENSURE ENLIVE)  237 mL Oral Daily  . hydrALAZINE  25 mg Oral Q8H  . tamsulosin  0.4 mg Oral Daily   Continuous Infusions: . 0.9 % NaCl with KCl 40 mEq / L 75 mL/hr (06/01/16 0507)     LOS: 1 day    Time spent: >30 MINS    [facility]  [facility] Pager [phone]. If 7PM-7AM, please contact night-coverage at [email or web address], password [facility] 06/01/2016, 8:02 AM  LOS: 1 day

## 2016-06-01 NOTE — Progress Notes (Addendum)
The patients HR appear to have decreased throughout the night. In the low 40's this morning. Getting EKG. BP 117/61. Page Allyson Sabal. I will continue to monitor the patient closely.   Saddie Benders RN  Spoke to Cardiology [place] on the unit about low HR.

## 2016-06-01 NOTE — Progress Notes (Signed)
  Echocardiogram 2D Echocardiogram has been performed.  Stephen Copeland 06/01/2016, 4:53 PM

## 2016-06-01 NOTE — Progress Notes (Addendum)
Progress Note  Patient Name: Stephen Copeland Date of Encounter: 06/01/2016  Primary Cardiologist: [name]  Patient Profile   Stephen Copeland is a 76 y.o. with past medical history significant for HTN, HLD, sinus bradycardia, hiatal hernia, dilated CM with EF 40%  GERD, atrial fibrillation with LBBB (on amiodarone @ home), pre-diabetes who presented via EMS to [facility] for complaints of at 3 falls. He also was found to have generalized weakness, shortness of breath, lower abdominal pain, fatigue, decreased appetite. He denied chest pain, nausea, vomiting or diarrhea. He is followed by Dr. [name] for cardiology and by urology. He was found to have a UTI with acute kidney injury, SCr 2.14. Bladder scan in the ED showed retention of 805 mL urine.   Subjective   Was sleeping soundly when I went in. Complains of cough Has yet to get out of bed.  Inpatient Medications    Scheduled Meds: . apixaban  5 mg Oral BID  . cefTRIAXone (ROCEPHIN)  IV  1 g Intravenous Q24H  . feeding supplement (ENSURE ENLIVE)  237 mL Oral Daily  . hydrALAZINE  25 mg Oral Q8H  . tamsulosin  0.4 mg Oral Daily   Continuous Infusions: . 0.9 % NaCl with KCl 40 mEq / L 75 mL/hr (06/01/16 0507)   PRN Meds: acetaminophen **OR** acetaminophen, guaiFENesin-dextromethorphan, hydrALAZINE, ipratropium-albuterol, nitroGLYCERIN, ondansetron **OR** ondansetron (ZOFRAN) IV   Vital Signs    Vitals:   05/31/16 1405 05/31/16 2016 06/01/16 0500 06/01/16 0748  BP:  (!) 141/83 (!) 112/103 117/61  Pulse:  (!) 55    Resp:  18    Temp: 98.2 F (36.8 C)  98.1 F (36.7 C)   TempSrc: Oral Axillary Oral   SpO2:  100% 94% 97%  Weight:   77.5 kg (170 lb 14.4 oz)   Height:        Intake/Output Summary (Last 24 hours) at 06/01/16 1202 Last data filed at 06/01/16 1009  Gross per 24 hour  Intake          2398.75 ml  Output             2200 ml  Net           198.75 ml   Filed Weights   05/31/16 0500 06/01/16 0500  Weight:  77.8 kg (171 lb 9.6 oz) 77.5 kg (170 lb 14.4 oz)    Telemetry    Mostly SBradycardia 40-55 bpm - Personally Reviewed  ECG    S Brady 45 bpm 1 AVB, LBBB with LAD (-62) - Personally Reviewed  Physical Exam   GEN: Was resting comfortably in bed when I arrived, upon arousal, he yawned frequently and complained of coughing. Did not note symptomatically dyspneic, however he does seem to be "huffing and puffing".   Neck:  minimal JVD Cardiac:  bradycardic, but with regular rhythm., no murmurs, rubs, or gallops.  Respiratory:  decreased effort, but no obvious rales or rhonchi GI: Soft, nontender, non-distended  MS: No edema; No deformity. Neuro:  Nonfocal - still quite groggy  [employer] Recent Labs Lab 05/31/16 0015 05/31/16 0538 05/31/16 0836 05/31/16 1444 06/01/16 0342  NA 141 141  --   --  139  K 3.7 3.2*  --   --  4.2  CL 103 100*  --   --  105  CO2 24 25  --   --  25  GLUCOSE 121* 113*  --   --  114*  BUN 35* 37*  --   --  39*  CREATININE 2.14* 2.15*  --   --  1.89*  CALCIUM 9.1 9.2  --   --  8.6*  PROT  --   --  6.3* 6.3* 5.6*  ALBUMIN  --   --  3.3* 3.2* 2.8*  AST  --   --  290* 324* 311*  ALT  --   --  226* 268* 280*  ALKPHOS  --   --  114 106 94  BILITOT  --   --  1.5* 0.8 1.2  GFRNONAA 29* 28*  --   --  33*  GFRAA 33* 33*  --   --  38*  ANIONGAP 14 16*  --   --  9     Hematology Recent Labs Lab 05/31/16 0015 06/01/16 0342  WBC 9.7 7.2  RBC 4.43 3.90*  HGB 13.9 12.4*  HCT 41.8 36.8*  MCV 94.4 94.4  MCH 31.4 31.8  MCHC 33.3 33.7  RDW 14.1 14.4  PLT 196 180    Cardiac Enzymes Recent Labs Lab 05/31/16 0015 05/31/16 0538 05/31/16 0836 05/31/16 1756  TROPONINI 0.13* 0.10* 0.10* 0.12*   No results for input(s): TROPIPOC in the last 168 hours.   BNPNo results for input(s): BNP, PROBNP in the last 168 hours.   DDimer No results for input(s): DDIMER in the last 168 hours.   Radiology    Ct Abdomen Pelvis Wo Contrast  Result Date:  05/31/2016 CLINICAL DATA:  Hydronephrosis.  Generalized weakness. EXAM: CT ABDOMEN AND PELVIS WITHOUT CONTRAST TECHNIQUE: Multidetector CT imaging of the abdomen and pelvis was performed following the standard protocol without IV contrast. COMPARISON:  Ultrasound same day FINDINGS: Lower chest: Linear atelectasis in both lower lobes. Moderate pericardial effusion. Cardiomegaly. No pleural effusion. Small hiatal hernia. Hepatobiliary: Liver parenchyma is normal. Small calcified stones or hyperdense sludge dependent within the gallbladder. There are multiple stones in the distal common bile duct without ductal dilatation. Pancreas: Normal.  Multiple stones in the distal common bile duct. Spleen: Normal Adrenals/Urinary Tract: Adrenal glands are normal. Left kidney is normal without contrast. Tiny exit fix cyst lower pole. Right kidney shows no significant parenchymal finding. There is fullness of the renal collecting system an extrarenal pelvis. The proximal ureter is tortuous in the ureter is then dilated all the way to the bladder. No evidence of stone. The prostate gland is markedly enlarged. There are a few small bladder diverticulae. The bladder appears thick walled. Stomach/Bowel: No acute or significant bowel finding. Scattered colonic diverticulae without diverticulitis. Vascular/Lymphatic: Aortic atherosclerosis. No aneurysm. IVC is normal. No retroperitoneal mass or lymphadenopathy. Reproductive: None other significant. Other: Small inguinal hernia on the right containing fat. Musculoskeletal: Chronic lumbar degenerative changes. IMPRESSION: Moderate pericardial effusion. Cardiomegaly. Linear atelectasis or scarring at the lung bases. Gallstones/hyperdense sludge dependent in the gallbladder. Numerous small stones in the distal common bile duct. No evidence of ductal dilatation. Marked enlargement of the prostate. Evidence of chronic bladder outlet obstruction with thick-walled bladder with multiple  diverticulae. Chronic appearing compensated hydroureteronephrosis on the right, presumably secondary to the chronic bladder disease. No evidence of urinary tract stone disease. Aortic atherosclerosis. Electronically Signed   By: Nelson Chimes M.D.   On: 05/31/2016 16:34   US Renal  Result Date: 05/31/2016 CLINICAL DATA:  Acute kidney injury. EXAM: RENAL / URINARY TRACT ULTRASOUND COMPLETE COMPARISON:  None. FINDINGS: Right Kidney: Length: 12.7 cm. Echogenicity within normal limits. No mass visualized. Moderate hydronephrosis is noted. Left Kidney: Length: 12.5 cm. Echogenicity within normal limits. No  mass visualized. Mild hydronephrosis is noted. Bladder: Mobile debris or sludge is noted within the dependent portion of the urinary bladder. Left ureteral jet is noted. Right she at is not clearly identified. Mildly distended urinary bladder is noted. IMPRESSION: Moderate right hydronephrosis. Mild left hydronephrosis. Mildly distended urinary bladder is noted with left ureteral jet visualized. Mobile debris or sludge is noted in dependent portion of urinary bladder lumen. Electronically Signed   By: Marijo Conception, M.D.   On: 05/31/2016 08:50   Dg Chest Portable 1 View  Result Date: 05/31/2016 CLINICAL DATA:  Initial evaluation for acute weakness. EXAM: PORTABLE CHEST 1 VIEW COMPARISON:  Prior radiograph from 12/19/2015. FINDINGS: Moderate cardiomegaly, stable from prior. Mediastinal silhouette within normal limits. Aortic atherosclerosis noted. Lungs hypoinflated. Secondary bibasilar and perihilar vascular congestion. Linear scarring noted within the right perihilar region, similar to previous. No pulmonary edema or pleural effusion. No definite focal infiltrates. No pneumothorax. No acute osseus abnormality. IMPRESSION: 1. Shallow lung inflation with secondary bibasilar/perihilar bronchovascular crowding. No other active cardiopulmonary disease. 2. Stable cardiomegaly. 3. Aortic atherosclerosis.  Electronically Signed   By: Jeannine Boga M.D.   On: 05/31/2016 00:44    Cardiac Studies   Echo done - no report as yet.   Assessment & Plan     Active Problems:   Paroxysmal Atrial fibrillation (HCC) - with baseline sinus bradycardia (borderline sick sinus syndrome)  Amiodarone held because of worsening LFTs. With his bradycardia, I would simply continue to hold for now.  If possible would be nice to assess his heart rate responsiveness to activity. Resting bradycardia in the 40s while sleeping is not overly concerning.  The presence of some significant sinus bradycardia would preclude restarting beta blocker at this time.  The concern would be what we do with amiodarone on discharge based on his bradycardia here.   We will continue to monitor -- at present, no requirement for pacemaker (especially since he has amiodarone on board)    ELEVATED Troponin: No clear reason to suspect that this wasn't a the related ACS. I just simply suspected it has to do with his underlying illness in the setting of baseline cardiomyopathy. He does have outpatient ischemic evaluation planned, I would not etc. further evaluation until he is more stabilized and ready for discharge from this current condition.    Chronic combined systolic and diastolic CHF (congestive heart failure) (HCC) - seems relatively euvolemic (chest x-ray showed no active pulmonary edema)  With his renal insufficiency, I would avoid diuresis because he has no signs of acute heart failure  He is on a drowsing regular reduction.  Metoprolol has been held due to bradycardia  : Would probably opt to not continue metoprolol based on sinus bradycardia and possible sick sinus syndrome.  ACE inhibitor being held for renal insufficiency and borderline blood pressures in the setting of ongoing infection. - Once he is stabilized, we would want to restart this and the renal function evaluation.       Hypertension: Blood pressure is  relatively stable on current medicines.  UTI with Acute urinary retention - per [facility]   Acute kidney injury superimposed on chronic kidney disease ([place]) - per [facility], gradually improving    Generalized weakness -- it is quite possible that his bradycardia could be playing a role with his profound fatigue, however with him just lying in bed, have no way of knowing what his chronotropic responsiveness would be.   Falls    Signed, Glenetta Hew, MD  06/01/2016, 12:02 PM

## 2016-06-02 LAB — HEPATITIS PANEL, ACUTE
HCV Ab: <0.1 s/co ratio (ref 0.0–0.9)
Hep A IgM: NEGATIVE
Hep B C IgM: NEGATIVE
Hepatitis B Surface Ag: NEGATIVE

## 2016-06-02 LAB — CBC
HCT: 39 % (ref 39.0–52.0)
HEMOGLOBIN: 12.8 g/dL — AB (ref 13.0–17.0)
MCH: 31 pg (ref 26.0–34.0)
MCHC: 32.8 g/dL (ref 30.0–36.0)
MCV: 94.4 fL (ref 78.0–100.0)
PLATELETS: 151 10*3/uL (ref 150–400)
RBC: 4.13 MIL/uL — AB (ref 4.22–5.81)
RDW: 14.4 % (ref 11.5–15.5)
WBC: 5.6 10*3/uL (ref 4.0–10.5)

## 2016-06-02 LAB — COMPREHENSIVE METABOLIC PANEL
ALBUMIN: 2.8 g/dL — AB (ref 3.5–5.0)
ALT: 274 U/L — ABNORMAL HIGH (ref 17–63)
AST: 285 U/L — AB (ref 15–41)
Alkaline Phosphatase: 98 U/L (ref 38–126)
Anion gap: 9 (ref 5–15)
BUN: 23 mg/dL — ABNORMAL HIGH (ref 6–20)
CO2: 23 mmol/L (ref 22–32)
Calcium: 8.4 mg/dL — ABNORMAL LOW (ref 8.9–10.3)
Chloride: 107 mmol/L (ref 101–111)
Creatinine, Ser: 1.34 mg/dL — ABNORMAL HIGH (ref 0.61–1.24)
GFR calc Af Amer: 58 mL/min — ABNORMAL LOW (ref >60)
GFR calc non Af Amer: 50 mL/min — ABNORMAL LOW (ref >60)
GLUCOSE: 98 mg/dL (ref 65–99)
POTASSIUM: 4.5 mmol/L (ref 3.5–5.1)
SODIUM: 139 mmol/L (ref 135–145)
Total Bilirubin: 1 mg/dL (ref 0.3–1.2)
Total Protein: 5.5 g/dL — ABNORMAL LOW (ref 6.5–8.1)

## 2016-06-02 MED ORDER — FUROSEMIDE 20 MG PO TABS
20.0000 mg | ORAL_TABLET | Freq: Every day | ORAL | Status: DC
  Administered 2016-06-02: 20 mg via ORAL
  Filled 2016-06-02: qty 1

## 2016-06-02 NOTE — Progress Notes (Signed)
Progress Note  Patient Name: Stephen Copeland Date of Encounter: 06/02/2016  Primary Cardiologist: [name]  Patient Profile   Stephen Copeland is a 76 y.o. 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 He also was found to have generalized weakness, shortness of breath, lower abdominal pain, fatigue, decreased appetite. He denied chest pain, nausea, vomiting or diarrhea. . He was found to have a UTI with acute kidney injury, SCr 2.14. Bladder scan in the ED showed retention of 805 mL urine. Now with indwelling foley    Subjective   No chest pain or dyspnea knees  And ankles hurt from falls   Inpatient Medications    Scheduled Meds: . apixaban  5 mg Oral BID  . cefTRIAXone (ROCEPHIN)  IV  1 g Intravenous Q24H  . feeding supplement (ENSURE ENLIVE)  237 mL Oral Daily  . furosemide  20 mg Oral Daily  . hydrALAZINE  25 mg Oral Q8H  . tamsulosin  0.4 mg Oral Daily   Continuous Infusions:  PRN Meds: acetaminophen **OR** acetaminophen, guaiFENesin-dextromethorphan, hydrALAZINE, ipratropium-albuterol, nitroGLYCERIN, ondansetron **OR** ondansetron (ZOFRAN) IV   Vital Signs    Vitals:   06/01/16 1225 06/01/16 2050 06/02/16 0347 06/02/16 0602  BP: (!) 146/70 (!) 145/75 (!) 162/76 123/63  Pulse: (!) 59  66   Resp: 18 20 18    Temp: 98.1 F (36.7 C) 98.3 F (36.8 C) 98.4 F (36.9 C)   TempSrc: Oral Oral Oral   SpO2: 90% 96% 92%   Weight:   170 lb 3.2 oz (77.2 kg)   Height:        Intake/Output Summary (Last 24 hours) at 06/02/16 0948 Last data filed at 06/02/16 6045  Gross per 24 hour  Intake             1835 ml  Output             1200 ml  Net              635 ml   Filed Weights   05/31/16 0500 06/01/16 0500 06/02/16 0347  Weight: 171 lb 9.6 oz (77.8 kg) 170 lb 14.4 oz (77.5 kg)  170 lb 3.2 oz (77.2 kg)    Telemetry    NSR rates 50 's  06/02/2016  - Personally Reviewed  ECG    S Brady 45 bpm 1 AVB, LBBB with LAD (-62) - Personally Reviewed  Physical Exam   Affect appropriate Elderly white male  HEENT: normal Neck supple with no adenopathy JVP normal no bruits no thyromegaly Lungs clear with no wheezing and good diaphragmatic motion Heart:  S1/S2 no murmur, no rub, gallop or click PMI normal Abdomen: benighn, BS positve, no tenderness, no AAA foley to gravity  no bruit.  No HSM or HJR Distal pulses intact with no bruits No edema Neuro non-focal Skin warm and dry Chronic bunions and toe deformity    Labs    Chemistry Recent Labs Lab 05/31/16 0538  05/31/16 1444 06/01/16 0342 06/02/16 0331  NA 141  --   --  139 139  K 3.2*  --   --  4.2 4.5  CL 100*  --   --  105 107  CO2 25  --   --  25 23  GLUCOSE 113*  --   --  114* 98  BUN 37*  --   --  39* 23*  CREATININE 2.15*  --   --  1.89* 1.34*  CALCIUM 9.2  --   --  8.6* 8.4*  PROT  --   < > 6.3* 5.6* 5.5*  ALBUMIN  --   < > 3.2* 2.8* 2.8*  AST  --   < > 324* 311* 285*  ALT  --   < > 268* 280* 274*  ALKPHOS  --   < > 106 94 98  BILITOT  --   < > 0.8 1.2 1.0  GFRNONAA 28*  --   --  33* 50*  GFRAA 33*  --   --  38* 58*  ANIONGAP 16*  --   --  9 9  < > = values in this interval not displayed.   Hematology  Recent Labs Lab 05/31/16 0015 06/01/16 0342 06/02/16 0331  WBC 9.7 7.2 5.6  RBC 4.43 3.90* 4.13*  HGB 13.9 12.4* 12.8*  HCT 41.8 36.8* 39.0  MCV 94.4 94.4 94.4  MCH 31.4 31.8 31.0  MCHC 33.3 33.7 32.8  RDW 14.1 14.4 14.4  PLT 196 180 151    Cardiac Enzymes  Recent Labs Lab 05/31/16 0015 05/31/16 0538 05/31/16 0836 05/31/16 1756  TROPONINI 0.13* 0.10* 0.10* 0.12*   No results for input(s): TROPIPOC in the last 168 hours.   BNPNo results for input(s): BNP, PROBNP in the last 168 hours.   DDimer No results for input(s): DDIMER in the last 168 hours.   Radiology      Ct Abdomen Pelvis Wo Contrast  Result Date: 05/31/2016 CLINICAL DATA:  Hydronephrosis.  Generalized weakness. EXAM: CT ABDOMEN AND PELVIS WITHOUT CONTRAST TECHNIQUE: Multidetector CT imaging of the abdomen and pelvis was performed following the standard protocol without IV contrast. COMPARISON:  Ultrasound same day FINDINGS: Lower chest: Linear atelectasis in both lower lobes. Moderate pericardial effusion. Cardiomegaly. No pleural effusion. Small hiatal hernia. Hepatobiliary: Liver parenchyma is normal. Small calcified stones or hyperdense sludge dependent within the gallbladder. There are multiple stones in the distal common bile duct without ductal dilatation. Pancreas: Normal.  Multiple stones in the distal common bile duct. Spleen: Normal Adrenals/Urinary Tract: Adrenal glands are normal. Left kidney is normal without contrast. Tiny exit fix cyst lower pole. Right kidney shows no significant parenchymal finding. There is fullness of the renal collecting system an extrarenal pelvis. The proximal ureter is tortuous in the ureter is then dilated all the way to the bladder. No evidence of stone. The prostate gland is markedly enlarged. There are a few small bladder diverticulae. The bladder appears thick walled. Stomach/Bowel: No acute or significant bowel finding. Scattered colonic diverticulae without diverticulitis. Vascular/Lymphatic: Aortic atherosclerosis. No aneurysm. IVC is normal. No retroperitoneal mass or lymphadenopathy. Reproductive: None other significant. Other: Small inguinal hernia on the right containing fat. Musculoskeletal: Chronic lumbar degenerative changes. IMPRESSION: Moderate pericardial effusion. Cardiomegaly. Linear atelectasis or scarring at the lung bases. Gallstones/hyperdense sludge dependent in the gallbladder. Numerous small stones in the distal common bile duct. No evidence of ductal dilatation. Marked enlargement of the prostate. Evidence of chronic bladder outlet obstruction  with thick-walled bladder with multiple diverticulae. Chronic appearing compensated hydroureteronephrosis on the right, presumably secondary to the chronic bladder disease. No evidence of urinary tract stone disease. Aortic atherosclerosis. Electronically Signed   By: Nelson Chimes M.D.   On: 05/31/2016 16:34    Cardiac Studies   Echo done - no report as yet.   Assessment &  Plan     Active Problems:    Paroxysmal Atrial fibrillation (HCC) - with baseline sinus bradycardia (borderline sick sinus syndrome) holding amiodarone for elevated LFTls continue eliquis      ELEVATED Troponin: no chest pain no elevation trend observe consider outpatient myovue   Chronic combined systolic and diastolic CHF (congestive heart failure) (HCC) - seems relatively euvolemic (chest x-ray showed no active pulmonary edema)  Echo EF 30% fairly stable lasix and hydralazine consider adding nitrates no ACE due to renal issues   UTI with Acute urinary retention - per [facility]   Acute kidney injury superimposed on chronic kidney disease ([place]) - per [facility], gradually improving may need to go home with foley Plan per urology continues on iv rocephin   Ok to d/c from cardiac standpoint     Signed, Jenkins Rouge, MD  06/02/2016, 9:48 AM

## 2016-06-02 NOTE — Discharge Instructions (Addendum)
Information on my medicine - ELIQUIS (apixaban)  This medication education was reviewed with me or my healthcare representative as part of my discharge preparation.  The pharmacist that spoke with me during my hospital stay was:  Einar Grad, [facility]  Why was Eliquis prescribed for you? Eliquis was prescribed for you to reduce the risk of a blood clot forming that can cause a stroke if you have a medical condition called atrial fibrillation (a type of irregular heartbeat).  What do You need to know about Eliquis ? Take your Eliquis TWICE DAILY - one tablet in the morning and one tablet in the evening with or without food. If you have difficulty swallowing the tablet whole please discuss with your pharmacist how to take the medication safely.  Take Eliquis exactly as prescribed by your doctor and DO NOT stop taking Eliquis without talking to the doctor who prescribed the medication.  Stopping may increase your risk of developing a stroke.  Refill your prescription before you run out.  After discharge, you should have regular check-up appointments with your healthcare provider that is prescribing your Eliquis.  In the future your dose may need to be changed if your kidney function or weight changes by a significant amount or as you get older.  What do you do if you miss a dose? If you miss a dose, take it as soon as you remember on the same day and resume taking twice daily.  Do not take more than one dose of ELIQUIS at the same time to make up a missed dose.  Important Safety Information A possible side effect of Eliquis is bleeding. You should call your healthcare provider right away if you experience any of the following: ? Bleeding from an injury or your nose that does not stop. ? Unusual colored urine (red or dark brown) or unusual colored stools (red or black). ? Unusual bruising for unknown reasons. ? A serious fall or if you hit your head (even if there is no bleeding).  Some  medicines may interact with Eliquis and might increase your risk of bleeding or clotting while on Eliquis. To help avoid this, consult your healthcare provider or pharmacist prior to using any new prescription or non-prescription medications, including herbals, vitamins, non-steroidal anti-inflammatory drugs (NSAIDs) and supplements.  This website has more information on Eliquis (apixaban): http://www.eliquis.com/eliquis/home     Urinary Tract Infection, Adult A urinary tract infection (UTI) is an infection of any part of the urinary tract, which includes the kidneys, ureters, bladder, and urethra. These organs make, store, and get rid of urine in the body. UTI can be a bladder infection (cystitis) or kidney infection (pyelonephritis). What are the causes? This infection may be caused by fungi, viruses, or bacteria. Bacteria are the most common cause of UTIs. This condition can also be caused by repeated incomplete emptying of the bladder during urination. What increases the risk? This condition is more likely to develop if:  You ignore your need to urinate or hold urine for long periods of time.  You do not empty your bladder completely during urination.  You wipe back to front after urinating or having a bowel movement, if you are male.  You are uncircumcised, if you are male.  You are constipated.  You have a urinary catheter that stays in place (indwelling).  You have a weak defense (immune) system.  You have a medical condition that affects your bowels, kidneys, or bladder.  You have diabetes.  You take  antibiotic medicines frequently or for long periods of time, and the antibiotics no longer work well against certain types of infections (antibiotic resistance).  You take medicines that irritate your urinary tract.  You are exposed to chemicals that irritate your urinary tract.  You are male. What are the signs or symptoms? Symptoms of this condition  include:  Fever.  Frequent urination or passing small amounts of urine frequently.  Needing to urinate urgently.  Pain or burning with urination.  Urine that smells bad or unusual.  Cloudy urine.  Pain in the lower abdomen or back.  Trouble urinating.  Blood in the urine.  Vomiting or being less hungry than normal.  Diarrhea or abdominal pain.  Vaginal discharge, if you are male. How is this diagnosed? This condition is diagnosed with a medical history and physical exam. You will also need to provide a urine sample to test your urine. Other tests may be done, including:  Blood tests.  Sexually transmitted disease (STD) testing. If you have had more than one UTI, a cystoscopy or imaging studies may be done to determine the cause of the infections. How is this treated? Treatment for this condition often includes a combination of two or more of the following:  Antibiotic medicine.  Other medicines to treat less common causes of UTI.  Over-the-counter medicines to treat pain.  Drinking enough water to stay hydrated. Follow these instructions at home:  Take over-the-counter and prescription medicines only as told by your health care provider.  If you were prescribed an antibiotic, take it as told by your health care provider. Do not stop taking the antibiotic even if you start to feel better.  Avoid alcohol, caffeine, tea, and carbonated beverages. They can irritate your bladder.  Drink enough fluid to keep your urine clear or pale yellow.  Keep all follow-up visits as told by your health care provider. This is important.  Make sure to:  Empty your bladder often and completely. Do not hold urine for long periods of time.  Empty your bladder before and after sex.  Wipe from front to back after a bowel movement if you are male. Use each tissue one time when you wipe. Contact a health care provider if:  You have back pain.  You have a fever.  You feel  nauseous or vomit.  Your symptoms do not get better after 3 days.  Your symptoms go away and then return. Get help right away if:  You have severe back pain or lower abdominal pain.  You are vomiting and cannot keep down any medicines or water. This information is not intended to replace advice given to you by your health care provider. Make sure you discuss any questions you have with your health care provider. Document Released: 11/29/2004 Document Revised: 08/03/2015 Document Reviewed: 01/10/2015 Elsevier Interactive Patient Education  2017 [employer].

## 2016-06-02 NOTE — Progress Notes (Signed)
[facility] PROGRESS NOTE  Stephen Copeland GMW:[id number] DOB: 1940-04-09 DOA: 05/30/2016   PCP: No PCP Per Patient     Assessment/Plan: Principal Problem:   UTI (urinary tract infection) Active Problems:   Atrial fibrillation (HCC)   Hypertension   Elevated troponin   Chronic systolic CHF (congestive heart failure) (HCC)   Acute urinary retention   Acute kidney injury superimposed on chronic kidney disease (HCC)   Generalized weakness   Falls    76 y.o.malewith medical history significant of HTN, HLD, A. Fib with LBBB, systolic CHF with last EF 40%,dilated cardiomyopathy, pre-diabetes; who presents with complaints of at least 3 falls days.Patient was found to have a urinary tract infection, acute kidney injury with creatinine 2.14(baseline creatinine previously 1.1), and elevated troponin of 0.13. Bladder scan in the ED showed retention of 805 mL of urine. Patient admitted for UTI, possible sepsis, possible pyelonephritis  Assessment and plan Urinary tract infection with hematuria/urinary retention Continue telemetry,  blood culture no growth so far Urine culture insignificant growth Continue Rocephin per pharmacy, since 3/29 Continue anticoagulation for now, hemoglobin stable Renal ultrasound shows right-sided hydronephrosis  CT abdomen pelvis  , shows enlargement of the prostrate compensated hydroureteronephrosis Consulted Dr Matilde Sprang for foley placement and hydronephrosis, he advised RN to attempt Foley based on cystoscopy results. Foley placed without any issues. Patient would need to follow-up with urology in the outpatient setting  Acute kidney injury on chronic kidney disease stage YQI:[id number] creatinine previously 1.07-1.15, but patient presents with elevated creatinine of 2.14 and a BUN of 35. Creatinine improving after Foley placement, creatinine down to 1.34 Follow BMP    Urinary retention 2/2 BPH: Patient noted to have over 800 mL of urine  present post void on bladder scan.Suspect secondary to BPH. - Continue Flomax - Place Foley catheter   Transaminitis secondary to hepatic congestion, due to CHF  Gallstones/hyperdense sludge dependent in the gallbladder. Numerous small stones in the distal common bile duct. No evidence of ductal dilatation. Acute Hepatitis panel negative Lipitor on hold, CK within normal limits Amiodarone held because of worsening LFTs  Generalized weakness with falls, likely secondary to the above - Physical therapy /OT-SNF  Elevated troponins in the setting of known cardiomyopathy/acute kidney injury: Patient has history of elevated troponin suspect symptoms could likely exacerbated with kidney failure.  , it appears that the patient was previously set up multiple times for left heart cath,but they were subsequent canceled.  Trend cardiac troponins, 2-D echo to rule out wall motion abnormalities , pericardial effusion Troponin levels were elevated, but in the absence of any active anginal symptoms, this is probably likely just mild troponin leak from systemic illness. This goes along with the elevated LFTs. Hold amiodarone until we reassess his liver functions Hold metoprolol in the setting of bradycardia   Atrial fibrillation:CHADSVASC is at least 3. Continue Eliquis  H/O systolic CHF with dilated cardiomyopathy/moderate pericardial effusion on CT  , last known EF 40% Repeat 2-D echo , EF 35-40%, moderate pulmonary hypertension, echo negative for pericardial effusion hold metoprolol given bradycardia seems relatively euvolemic (chest x-ray showed no active pulmonary edema) ACE inhibitor being held for renal insufficiency and borderline blood pressures in the setting of ongoing infection Resume lasix today  Essential hypertension Continue to lisinopril secondary to acute kidney injury  Prediabetes Monitor CBGs  Hemoglobin A1c 5.7  Low TSH-  free T4 1.6   DVT prophylaxsis  eliquis  Code Status:  Full code  Family Communication: Discussed in detail with the patient, all imaging results, lab results explained to the patient   Disposition Plan:   Continue telemetry, social work consult for SNF      Consultants:  Cardiology  Procedures:  None  Antibiotics: Anti-infectives    Start     Dose/Rate Route Frequency Ordered Stop   05/31/16 2200  cefTRIAXone (ROCEPHIN) 1 g in dextrose 5 % 50 mL IVPB     1 g 100 mL/hr over 30 Minutes Intravenous Every 24 hours 05/31/16 0530     05/31/16 0300  cefTRIAXone (ROCEPHIN) 1 g in dextrose 5 % 50 mL IVPB     1 g 100 mL/hr over 30 Minutes Intravenous  Once 05/31/16 0249 05/31/16 0341         HPI/Subjective: Sinus bradycardia improved overnight, alert , wants foley removed   Objective: Vitals:   06/01/16 1225 06/01/16 2050 06/02/16 0347 06/02/16 0602  BP: (!) 146/70 (!) 145/75 (!) 162/76 123/63  Pulse: (!) 59  66   Resp: 18 20 18    Temp: 98.1 F (36.7 C) 98.3 F (36.8 C) 98.4 F (36.9 C)   TempSrc: Oral Oral Oral   SpO2: 90% 96% 92%   Weight:   77.2 kg (170 lb 3.2 oz)   Height:        Intake/Output Summary (Last 24 hours) at 06/02/16 0802 Last data filed at 06/02/16 7026  Gross per 24 hour  Intake             2035 ml  Output             1200 ml  Net              835 ml    Exam:  Examination:  General exam: Appears calm and comfortable  Respiratory system: Clear to auscultation. Respiratory effort normal. Cardiovascular system: S1 & S2 heard, RRR. No JVD, murmurs, rubs, gallops or clicks. No pedal edema. Gastrointestinal system: Abdomen is nondistended, soft and nontender. No organomegaly or masses felt. Normal bowel sounds heard. Central nervous system: Alert and oriented. No focal neurological deficits. Extremities: Symmetric 5 x 5 power. Skin: No rashes, lesions or ulcers Psychiatry: Judgement and insight appear normal. Mood & affect appropriate.     Data Reviewed: I have  personally reviewed following labs and imaging studies  Micro Results Recent Results (from the past 240 hour(s))  Culture, blood (Routine X 2) w Reflex to ID Panel     Status: None (Preliminary result)   Collection Time: 05/31/16  8:49 AM  Result Value Ref Range Status   Specimen Description BLOOD RIGHT WRIST  Final   Special Requests BOTTLES DRAWN AEROBIC AND ANAEROBIC  5CC  Final   Culture NO GROWTH 1 DAY  Final   Report Status PENDING  Incomplete  Culture, blood (Routine X 2) w Reflex to ID Panel     Status: None (Preliminary result)   Collection Time: 05/31/16  8:54 AM  Result Value Ref Range Status   Specimen Description BLOOD RIGHT HAND  Final   Special Requests BOTTLES DRAWN AEROBIC AND ANAEROBIC  5CC  Final   Culture NO GROWTH 1 DAY  Final   Report Status PENDING  Incomplete    Radiology Reports Ct Abdomen Pelvis Wo Contrast  Result Date: 05/31/2016 CLINICAL DATA:  Hydronephrosis.  Generalized weakness. EXAM: CT ABDOMEN AND PELVIS WITHOUT CONTRAST TECHNIQUE: Multidetector CT imaging of the abdomen and pelvis was performed following the standard protocol without IV contrast. COMPARISON:  Ultrasound same day FINDINGS: Lower chest: Linear atelectasis in both lower lobes. Moderate pericardial effusion. Cardiomegaly. No pleural effusion. Small hiatal hernia. Hepatobiliary: Liver parenchyma is normal. Small calcified stones or hyperdense sludge dependent within the gallbladder. There are multiple stones in the distal common bile duct without ductal dilatation. Pancreas: Normal.  Multiple stones in the distal common bile duct. Spleen: Normal Adrenals/Urinary Tract: Adrenal glands are normal. Left kidney is normal without contrast. Tiny exit fix cyst lower pole. Right kidney shows no significant parenchymal finding. There is fullness of the renal collecting system an extrarenal pelvis. The proximal ureter is tortuous in the ureter is then dilated all the way to the bladder. No evidence of  stone. The prostate gland is markedly enlarged. There are a few small bladder diverticulae. The bladder appears thick walled. Stomach/Bowel: No acute or significant bowel finding. Scattered colonic diverticulae without diverticulitis. Vascular/Lymphatic: Aortic atherosclerosis. No aneurysm. IVC is normal. No retroperitoneal mass or lymphadenopathy. Reproductive: None other significant. Other: Small inguinal hernia on the right containing fat. Musculoskeletal: Chronic lumbar degenerative changes. IMPRESSION: Moderate pericardial effusion. Cardiomegaly. Linear atelectasis or scarring at the lung bases. Gallstones/hyperdense sludge dependent in the gallbladder. Numerous small stones in the distal common bile duct. No evidence of ductal dilatation. Marked enlargement of the prostate. 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 Chronic appearing compensated hydroureteronephrosis on the right, presumably secondary to the chronic bladder disease. No evidence of urinary tract stone disease. Aortic atherosclerosis. Electronically Signed   By: Nelson Chimes M.D.   On: 05/31/2016 16:34   US Renal  Result Date: 05/31/2016 CLINICAL DATA:  Acute kidney injury. EXAM: RENAL / URINARY TRACT ULTRASOUND COMPLETE COMPARISON:  None. FINDINGS: Right Kidney: Length: 12.7 cm. Echogenicity within normal limits. No mass visualized. Moderate hydronephrosis is noted. Left Kidney: Length: 12.5 cm. Echogenicity within normal limits. No mass visualized. Mild hydronephrosis is noted. Bladder: Mobile debris or sludge is noted within the dependent portion of the urinary bladder. Left ureteral jet is noted. Right she at is not clearly identified. Mildly distended urinary bladder is noted. IMPRESSION: Moderate right hydronephrosis. Mild left hydronephrosis. Mildly distended urinary bladder is noted with left ureteral jet visualized. Mobile debris or sludge is noted in dependent portion of  urinary bladder lumen. Electronically Signed   By: Marijo Conception, M.D.   On: 05/31/2016 08:50   Dg Chest Portable 1 View  Result Date: 05/31/2016 CLINICAL DATA:  Initial evaluation for acute weakness. EXAM: PORTABLE CHEST 1 VIEW COMPARISON:  Prior radiograph from 12/19/2015. FINDINGS: Moderate cardiomegaly, stable from prior. Mediastinal silhouette within normal limits. Aortic atherosclerosis noted. Lungs hypoinflated. Secondary bibasilar and perihilar vascular congestion. Linear scarring noted within the right perihilar region, similar to previous. No pulmonary edema or pleural effusion. No definite focal infiltrates. No pneumothorax. No acute osseus abnormality. IMPRESSION: 1. Shallow lung inflation with secondary bibasilar/perihilar bronchovascular crowding. No other active cardiopulmonary disease. 2. Stable cardiomegaly. 3. Aortic atherosclerosis. Electronically Signed   By: Jeannine Boga M.D.   On: 05/31/2016 00:44     CBC  Recent Labs Lab 05/31/16 0015 06/01/16 0342 06/02/16 0331  WBC 9.7 7.2 5.6  HGB 13.9 12.4* 12.8*  HCT 41.8 36.8* 39.0  PLT 196 180 151  MCV 94.4 94.4 94.4  MCH 31.4 31.8 31.0  MCHC 33.3 33.7 32.8  RDW 14.1 14.4 14.4  LYMPHSABS 0.9  --   --   MONOABS 1.7*  --   --   EOSABS 0.0  --   --  BASOSABS 0.1  --   --     Chemistries   Recent Labs Lab 05/31/16 0015 05/31/16 0538 05/31/16 0836 05/31/16 1444 06/01/16 0342 06/02/16 0331  NA 141 141  --   --  139 139  K 3.7 3.2*  --   --  4.2 4.5  CL 103 100*  --   --  105 107  CO2 24 25  --   --  25 23  GLUCOSE 121* 113*  --   --  114* 98  BUN 35* 37*  --   --  39* 23*  CREATININE 2.14* 2.15*  --   --  1.89* 1.34*  CALCIUM 9.1 9.2  --   --  8.6* 8.4*  AST  --   --  290* 324* 311* 285*  ALT  --   --  226* 268* 280* 274*  ALKPHOS  --   --  114 106 94 98  BILITOT  --   --  1.5* 0.8 1.2 1.0    ------------------------------------------------------------------------------------------------------------------ estimated creatinine clearance is 52 mL/min (A) (by C-G formula based on SCr of 1.34 mg/dL (H)). ------------------------------------------------------------------------------------------------------------------  Recent Labs  05/31/16 0015  HGBA1C 5.7*   ------------------------------------------------------------------------------------------------------------------ No results for input(s): CHOL, HDL, LDLCALC, TRIG, CHOLHDL, LDLDIRECT in the last 72 hours. ------------------------------------------------------------------------------------------------------------------  Recent Labs  05/31/16 0015  TSH 0.282*   ------------------------------------------------------------------------------------------------------------------ No results for input(s): VITAMINB12, FOLATE, FERRITIN, TIBC, IRON, RETICCTPCT in the last 72 hours.  Coagulation profile No results for input(s): INR, PROTIME in the last 168 hours.  No results for input(s): DDIMER in the last 72 hours.  Cardiac Enzymes  Recent Labs Lab 05/31/16 0538 05/31/16 0836 05/31/16 1756  TROPONINI 0.10* 0.10* 0.12*   ------------------------------------------------------------------------------------------------------------------ Invalid input(s): POCBNP   CBG: No results for input(s): GLUCAP in the last 168 hours.     Studies: Ct Abdomen Pelvis Wo Contrast  Result Date: 05/31/2016 CLINICAL DATA:  Hydronephrosis.  Generalized weakness. EXAM: CT ABDOMEN AND PELVIS WITHOUT CONTRAST TECHNIQUE: Multidetector CT imaging of the abdomen and pelvis was performed following the standard protocol without IV contrast. COMPARISON:  Ultrasound same day FINDINGS: Lower chest: Linear atelectasis in both lower lobes. Moderate pericardial effusion. Cardiomegaly. No pleural effusion. Small hiatal hernia. Hepatobiliary: Liver  parenchyma is normal. Small calcified stones or hyperdense sludge dependent within the gallbladder. There are multiple stones in the distal common bile duct without ductal dilatation. Pancreas: Normal.  Multiple stones in the distal common bile duct. Spleen: Normal Adrenals/Urinary Tract: Adrenal glands are normal. Left kidney is normal without contrast. Tiny exit fix cyst lower pole. Right kidney shows no significant parenchymal finding. There is fullness of the renal collecting system an extrarenal pelvis. The proximal ureter is tortuous in the ureter is then dilated all the way to the bladder. No evidence of stone. The prostate gland is markedly enlarged. There are a few small bladder diverticulae. The bladder appears thick walled. Stomach/Bowel: No acute or significant bowel finding. Scattered colonic diverticulae without diverticulitis. Vascular/Lymphatic: Aortic atherosclerosis. No aneurysm. IVC is normal. No retroperitoneal mass or lymphadenopathy. Reproductive: None other significant. Other: Small inguinal hernia on the right containing fat. Musculoskeletal: Chronic lumbar degenerative changes. IMPRESSION: Moderate pericardial effusion. Cardiomegaly. Linear atelectasis or scarring at the lung bases. Gallstones/hyperdense sludge dependent in the gallbladder. Numerous small stones in the distal common bile duct. No evidence of ductal dilatation. Marked enlargement of the prostate. 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 Chronic appearing compensated hydroureteronephrosis on the right, presumably  secondary to the chronic bladder disease. No evidence of urinary tract stone disease. Aortic atherosclerosis. Electronically Signed   By: Nelson Chimes M.D.   On: 05/31/2016 16:34   US Renal  Result Date: 05/31/2016 CLINICAL DATA:  Acute kidney injury. EXAM: RENAL / URINARY TRACT ULTRASOUND COMPLETE COMPARISON:  None. FINDINGS: Right Kidney: Length: 12.7  cm. Echogenicity within normal limits. No mass visualized. Moderate hydronephrosis is noted. Left Kidney: Length: 12.5 cm. Echogenicity within normal limits. No mass visualized. Mild hydronephrosis is noted. Bladder: Mobile debris or sludge is noted within the dependent portion of the urinary bladder. Left ureteral jet is noted. Right she at is not clearly identified. Mildly distended urinary bladder is noted. IMPRESSION: Moderate right hydronephrosis. Mild left hydronephrosis. Mildly distended urinary bladder is noted with left ureteral jet visualized. Mobile debris or sludge is noted in dependent portion of urinary bladder lumen. Electronically Signed   By: Marijo Conception, M.D.   On: 05/31/2016 08:50      Lab Results  Component Value Date   HGBA1C 5.7 (H) 05/31/2016   HGBA1C 5.9 (H) 12/19/2015   Lab Results  Component Value Date   LDLCALC 93 12/20/2015   CREATININE 1.34 (H) 06/02/2016       Scheduled Meds: . apixaban  5 mg Oral BID  . cefTRIAXone (ROCEPHIN)  IV  1 g Intravenous Q24H  . feeding supplement (ENSURE ENLIVE)  237 mL Oral Daily  . hydrALAZINE  25 mg Oral Q8H  . tamsulosin  0.4 mg Oral Daily   Continuous Infusions: . 0.9 % NaCl with KCl 40 mEq / L 75 mL/hr (06/01/16 1944)     LOS: 2 days    Time spent: >30 MINS    [facility]  [facility] Pager [phone]. If 7PM-7AM, please contact night-coverage at [email or web address], password [facility] 06/02/2016, 8:02 AM  LOS: 2 days

## 2016-06-03 ENCOUNTER — Inpatient Hospital Stay (HOSPITAL_COMMUNITY): Payer: Medicare Other

## 2016-06-03 DIAGNOSIS — N390 Urinary tract infection, site not specified: Principal | ICD-10-CM

## 2016-06-03 DIAGNOSIS — R319 Hematuria, unspecified: Secondary | ICD-10-CM

## 2016-06-03 LAB — CBC
HCT: 37.9 % — ABNORMAL LOW (ref 39.0–52.0)
Hemoglobin: 12.7 g/dL — ABNORMAL LOW (ref 13.0–17.0)
MCH: 31.5 pg (ref 26.0–34.0)
MCHC: 33.5 g/dL (ref 30.0–36.0)
MCV: 94 fL (ref 78.0–100.0)
PLATELETS: 145 10*3/uL — AB (ref 150–400)
RBC: 4.03 MIL/uL — AB (ref 4.22–5.81)
RDW: 14.7 % (ref 11.5–15.5)
WBC: 3.5 10*3/uL — ABNORMAL LOW (ref 4.0–10.5)

## 2016-06-03 LAB — COMPREHENSIVE METABOLIC PANEL
ALT: 245 U/L — AB (ref 17–63)
AST: 259 U/L — ABNORMAL HIGH (ref 15–41)
Albumin: 2.7 g/dL — ABNORMAL LOW (ref 3.5–5.0)
Alkaline Phosphatase: 94 U/L (ref 38–126)
Anion gap: 8 (ref 5–15)
BUN: 22 mg/dL — ABNORMAL HIGH (ref 6–20)
CALCIUM: 8.2 mg/dL — AB (ref 8.9–10.3)
CO2: 23 mmol/L (ref 22–32)
CREATININE: 1.24 mg/dL (ref 0.61–1.24)
Chloride: 104 mmol/L (ref 101–111)
GFR calc non Af Amer: 55 mL/min — ABNORMAL LOW (ref >60)
Glucose, Bld: 128 mg/dL — ABNORMAL HIGH (ref 65–99)
Potassium: 3.6 mmol/L (ref 3.5–5.1)
SODIUM: 135 mmol/L (ref 135–145)
TOTAL PROTEIN: 5.5 g/dL — AB (ref 6.5–8.1)
Total Bilirubin: 0.8 mg/dL (ref 0.3–1.2)

## 2016-06-03 MED ORDER — SODIUM CHLORIDE 0.9% FLUSH
3.0000 mL | Freq: Two times a day (BID) | INTRAVENOUS | Status: DC
  Administered 2016-06-03 – 2016-06-06 (×6): 3 mL via INTRAVENOUS

## 2016-06-03 MED ORDER — VANCOMYCIN HCL IN DEXTROSE 750-5 MG/150ML-% IV SOLN
750.0000 mg | Freq: Two times a day (BID) | INTRAVENOUS | Status: DC
  Filled 2016-06-03: qty 150

## 2016-06-03 MED ORDER — VANCOMYCIN HCL IN DEXTROSE 750-5 MG/150ML-% IV SOLN
750.0000 mg | Freq: Two times a day (BID) | INTRAVENOUS | Status: DC
  Administered 2016-06-03 – 2016-06-06 (×5): 750 mg via INTRAVENOUS
  Filled 2016-06-03 (×7): qty 150

## 2016-06-03 MED ORDER — FUROSEMIDE 40 MG PO TABS
40.0000 mg | ORAL_TABLET | Freq: Every day | ORAL | Status: DC
  Administered 2016-06-03 – 2016-06-06 (×4): 40 mg via ORAL
  Filled 2016-06-03 (×4): qty 1

## 2016-06-03 MED ORDER — SODIUM CHLORIDE 0.9% FLUSH: 3.0000 mL | INTRAVENOUS | Status: DC | PRN

## 2016-06-03 MED ORDER — CEFEPIME HCL 2 G IJ SOLR
2.0000 g | Freq: Two times a day (BID) | INTRAMUSCULAR | Status: DC
  Administered 2016-06-03 – 2016-06-06 (×7): 2 g via INTRAVENOUS
  Filled 2016-06-03 (×7): qty 2

## 2016-06-03 NOTE — Progress Notes (Signed)
Pharmacy Antibiotic Note  Stephen Copeland is a 76 y.o. male admitted on 05/30/2016 with HCAP.  Pharmacy has been consulted for vancomycin and cefepime dosing. Pt treated with ceftriaxone for x4 days for UTI, now with CXR showing possible PNA. Of note patient presented with AoCKD III with creatinine now slowly improving.  Plan: -Cefepime 2g IV q12h -Vancomycin 750mg  IV q12h -Monitor cultures, LOT, renal function -Obtain vancomycin level as indicated  Height: 6\' 2"  (188 cm) Weight: 172 lb 6.4 oz (78.2 kg) IBW/kg (Calculated) : 82.2  Temp (24hrs), Avg:98.4 F (36.9 C), Min:98.2 F (36.8 C), Max:98.6 F (37 C)   Recent Labs Lab 05/31/16 0015 05/31/16 0538 06/01/16 0342 06/02/16 0331 06/03/16 0256  WBC 9.7  --  7.2 5.6 3.5*  CREATININE 2.14* 2.15* 1.89* 1.34* 1.24    Estimated Creatinine Clearance: 56.9 mL/min (by C-G formula based on SCr of 1.24 mg/dL).    No Known Allergies  Antimicrobials this admission: 4/1 Cefepime >>  4/1 Vancomycin >>  3/29 Ceftriaxone >> 4/1   Dose adjustments this admission: none  Microbiology results: 3/29 BCx: NGTD  Thank you for allowing pharmacy to be a part of this patient's care.  Arrie Senate, PharmD PGY-1 Pharmacy Resident Pager: [phone] 06/03/2016

## 2016-06-03 NOTE — Progress Notes (Signed)
Progress Note  Patient Name: Stephen Copeland Date of Encounter: 06/03/2016  Primary Cardiologist: [name]  Patient Profile   Stephen Copeland is a 76 y.o. with past medical history significant for HTN, HLD, sinus bradycardia, hiatal hernia, dilated CM with EF 40%  GERD, atrial fibrillation with LBBB (on amiodarone @ home), pre-diabetes who presented via EMS to [facility] for complaints of at 3 falls. He also was found to have generalized weakness, shortness of breath, lower abdominal pain, fatigue, decreased appetite. He denied chest pain, nausea, vomiting or diarrhea. . He was found to have a UTI with acute kidney injury, SCr 2.14. Bladder scan in the ED showed retention of 805 mL urine. Now with indwelling foley    Subjective   No chest pain or dyspnea knees  And ankles hurt from falls   Inpatient Medications    Scheduled Meds: . apixaban  5 mg Oral BID  . cefTRIAXone (ROCEPHIN)  IV  1 g Intravenous Q24H  . feeding supplement (ENSURE ENLIVE)  237 mL Oral Daily  . furosemide  40 mg Oral Daily  . hydrALAZINE  25 mg Oral Q8H  . sodium chloride flush  3 mL Intravenous Q12H  . tamsulosin  0.4 mg Oral Daily   Continuous Infusions:  PRN Meds: acetaminophen **OR** acetaminophen, guaiFENesin-dextromethorphan, hydrALAZINE, ipratropium-albuterol, nitroGLYCERIN, ondansetron **OR** ondansetron (ZOFRAN) IV, sodium chloride flush   Vital Signs    Vitals:   06/02/16 2002 06/02/16 2316 06/03/16 0500 06/03/16 0700  BP: 127/70 121/87 (!) 139/59   Pulse: 74     Resp: 18  16   Temp: 98.3 F (36.8 C)  98.6 F (37 C)   TempSrc: Oral  Oral   SpO2: 94%  92%   Weight:    172 lb 6.4 oz (78.2 kg)  Height:        Intake/Output Summary (Last 24 hours) at 06/03/16 1000 Last data filed at 06/03/16 0841  Gross per 24 hour  Intake             1190 ml  Output             1125 ml  Net               65 ml   Filed Weights   06/01/16 0500 06/02/16 0347 06/03/16 0700  Weight: 170 lb 14.4 oz (77.5 kg)  170 lb 3.2 oz (77.2 kg) 172 lb 6.4 oz (78.2 kg)    Telemetry    NSR rates 50 's  06/03/2016  - Personally Reviewed  ECG    S Brady 45 bpm 1 AVB, LBBB with LAD (-62) - Personally Reviewed  Physical Exam   Affect appropriate Elderly white male  HEENT: normal Neck supple with no adenopathy JVP normal no bruits no thyromegaly Lungs clear with no wheezing and good diaphragmatic motion Heart:  S1/S2 no murmur, no rub, gallop or click PMI normal Abdomen: benighn, BS positve, no tenderness, no AAA foley to gravity  no bruit.  No HSM or HJR Distal pulses intact with no bruits No edema Neuro non-focal Skin warm and dry Chronic bunions and toe deformity    Labs    Chemistry  Recent Labs Lab 06/01/16 0342 06/02/16 0331 06/03/16 0256  NA 139 139 135  K 4.2 4.5 3.6  CL 105 107 104  CO2 25 23 23   GLUCOSE 114* 98 128*  BUN 39* 23* 22*  CREATININE 1.89* 1.34* 1.24  CALCIUM 8.6* 8.4* 8.2*  PROT 5.6* 5.5* 5.5*  ALBUMIN  2.8* 2.8* 2.7*  AST 311* 285* 259*  ALT 280* 274* 245*  ALKPHOS 94 98 94  BILITOT 1.2 1.0 0.8  GFRNONAA 33* 50* 55*  GFRAA 38* 58* >60  ANIONGAP 9 9 8      Hematology  Recent Labs Lab 06/01/16 0342 06/02/16 0331 06/03/16 0256  WBC 7.2 5.6 3.5*  RBC 3.90* 4.13* 4.03*  HGB 12.4* 12.8* 12.7*  HCT 36.8* 39.0 37.9*  MCV 94.4 94.4 94.0  MCH 31.8 31.0 31.5  MCHC 33.7 32.8 33.5  RDW 14.4 14.4 14.7  PLT 180 151 145*    Cardiac Enzymes  Recent Labs Lab 05/31/16 0015 05/31/16 0538 05/31/16 0836 05/31/16 1756  TROPONINI 0.13* 0.10* 0.10* 0.12*   No results for input(s): TROPIPOC in the last 168 hours.   BNPNo results for input(s): BNP, PROBNP in the last 168 hours.   DDimer No results for input(s): DDIMER in the last 168 hours.   Radiology    No results found.  Cardiac Studies   Echo done - no report as yet.   Assessment & Plan     Active Problems:  Paroxysmal Atrial fibrillation (HCC) - with baseline sinus bradycardia (borderline  sick sinus syndrome) holding amiodarone for elevated LFTls continue eliquis    ELEVATED Troponin: no chest pain no elevation trend observe consider outpatient myovue   Chronic combined systolic and diastolic CHF (congestive heart failure) (HCC) - seems relatively euvolemic (chest x-ray showed no active pulmonary edema)  Echo EF 30% fairly stable lasix and hydralazine consider adding nitrates no ACE due to renal issues   UTI with Acute urinary retention - per [facility]   Acute kidney injury superimposed on chronic kidney disease ([place]) - per [facility], gradually improving may need to go home with foley Plan per urology continues on iv rocephin   Ok to d/c from cardiac standpoint     Signed, Jenkins Rouge, MD  06/03/2016, 10:00 AM

## 2016-06-03 NOTE — Progress Notes (Signed)
[facility] PROGRESS NOTE  Stephen Copeland TDV:[id number] DOB: September 12, 1940 DOA: 05/30/2016   PCP: No PCP Per Patient     Assessment/Plan: Principal Problem:   UTI (urinary tract infection) Active Problems:   Atrial fibrillation (HCC)   Hypertension   Elevated troponin   Chronic systolic CHF (congestive heart failure) (HCC)   Acute urinary retention   Acute kidney injury superimposed on chronic kidney disease (HCC)   Generalized weakness   Falls    76 y.o.malewith medical history significant of HTN, HLD, A. Fib with LBBB, systolic CHF with last EF 40%,dilated cardiomyopathy, pre-diabetes; who presents with complaints of at least 3 falls days.Patient was found to have a urinary tract infection, acute kidney injury with creatinine 2.14(baseline creatinine previously 1.1), and elevated troponin of 0.13. Bladder scan in the ED showed retention of 805 mL of urine. Patient admitted for UTI, possible sepsis, possible pyelonephritis   Assessment and plan Urinary tract infection with hematuria/urinary retention, relieved by Foley catheter placement Continue telemetry,  blood culture no growth so far Urine culture insignificant growth Continue Rocephin per pharmacy, since 3/29 Continue anticoagulation for now, hemoglobin stable Renal ultrasound shows right-sided hydronephrosis CT abdomen pelvis  , shows enlargement of the prostrate compensated hydroureteronephrosis Consulted Dr Matilde Sprang for foley placement and hydronephrosis, he advised RN to attempt Foley based on cystoscopy results. Foley placed without any issues. Patient would need to follow-up with urology in the outpatient setting   HCAP Will start vanc and cefepime   Acute kidney injury on chronic kidney disease stage III secondary to obstructive uropathy:Baseline creatinine previously 1.07-1.15, but patient presents with elevated creatinine of 2.14 and a BUN of 35.  Creatinine improving after Foley  placement, creatinine down to 1.34>1.24 Follow BMP    Urinary retention 2/2 BPH: Patient noted to have over 800 mL of urine present post void on bladder scan.Suspect secondary to BPH. Continue Flomax Continue Foley catheter at discharge    Transaminitis secondary to hepatic congestion, due to CHF Gallstones/hyperdense sludge dependent in the gallbladder. Numerous small stones in the distal common bile duct. No evidence of ductal dilatation. Acute Hepatitis panel negative Lipitor on hold, CK within normal limits Amiodarone held because of worsening LFTs   Generalized weakness with falls, likely secondary to the above - Physical therapy /OT-SNF  Elevated troponins in the setting of known cardiomyopathy/acute kidney injury: Patient has history of elevated troponin suspect symptoms could likely exacerbated with kidney failure.  , it appears that the patient was previously set up multiple times for left heart cath,but they were subsequent canceled. Trend cardiac troponins, troponins improving, consider outpatient myovue as per cardiology Troponin levels were elevated, but in the absence of any active anginal symptoms, this is probably likely just mild troponin leak from systemic illness. This goes along with the elevated LFTs. Hold amiodarone until we reassess his liver functions Hold metoprolol in the setting of bradycardia   Atrial fibrillation:CHADSVASC is at least 3. Continue Eliquis  H/O systolic CHF with dilated cardiomyopathy/moderate pericardial effusion on CT  Repeat 2-D echo , EF 35-40%, moderate pulmonary hypertension, echo negative for pericardial effusion hold metoprolol given bradycardia seems relatively euvolemic (chest x-ray showed no active pulmonary edema) ACE inhibitor being held for renal insufficiency and borderline blood pressures in the setting of ongoing infection Increased dose of Lasix from 20-40 mg a day   Essential hypertension Continue to hold  lisinopril secondary to acute kidney injury  Prediabetes Monitor CBGs  Hemoglobin A1c 5.7  Low  TSH-  free T4 1.6   DVT prophylaxsis eliquis  Code Status:  Full code     Family Communication: Discussed in detail with the patient, tried to call the wife and leave voice message 3 times, voice mailbox full, all imaging results, lab results explained to the patient   Disposition Plan:   SNF Monday      Consultants:  Cardiology  Procedures:  None  Antibiotics: Anti-infectives    Start     Dose/Rate Route Frequency Ordered Stop   05/31/16 2200  cefTRIAXone (ROCEPHIN) 1 g in dextrose 5 % 50 mL IVPB     1 g 100 mL/hr over 30 Minutes Intravenous Every 24 hours 05/31/16 0530     05/31/16 0300  cefTRIAXone (ROCEPHIN) 1 g in dextrose 5 % 50 mL IVPB     1 g 100 mL/hr over 30 Minutes Intravenous  Once 05/31/16 0249 05/31/16 0341         HPI/Subjective:  cough , nonproductive   Objective: Vitals:   06/02/16 2002 06/02/16 2316 06/03/16 0500 06/03/16 0700  BP: 127/70 121/87 (!) 139/59   Pulse: 74     Resp: 18  16   Temp: 98.3 F (36.8 C)  98.6 F (37 C)   TempSrc: Oral  Oral   SpO2: 94%  92%   Weight:    78.2 kg (172 lb 6.4 oz)  Height:        Intake/Output Summary (Last 24 hours) at 06/03/16 2297 Last data filed at 06/03/16 0700  Gross per 24 hour  Intake             1070 ml  Output             1125 ml  Net              -55 ml    Exam:  Examination:  General exam: Appears calm and comfortable  Respiratory system: Clear to auscultation. Respiratory effort normal. Cardiovascular system: S1 & S2 heard, RRR. No JVD, murmurs, rubs, gallops or clicks. No pedal edema. Gastrointestinal system: Abdomen is nondistended, soft and nontender. No organomegaly or masses felt. Normal bowel sounds heard. Central nervous system: Alert and oriented. No focal neurological deficits. Extremities: Symmetric 5 x 5 power. Skin: No rashes, lesions or ulcers Psychiatry:  Judgement and insight appear normal. Mood & affect appropriate.     Data Reviewed: I have personally reviewed following labs and imaging studies  Micro Results Recent Results (from the past 240 hour(s))  Culture, blood (Routine X 2) w Reflex to ID Panel     Status: None (Preliminary result)   Collection Time: 05/31/16  8:49 AM  Result Value Ref Range Status   Specimen Description BLOOD RIGHT WRIST  Final   Special Requests BOTTLES DRAWN AEROBIC AND ANAEROBIC  5CC  Final   Culture NO GROWTH 2 DAYS  Final   Report Status PENDING  Incomplete  Culture, blood (Routine X 2) w Reflex to ID Panel     Status: None (Preliminary result)   Collection Time: 05/31/16  8:54 AM  Result Value Ref Range Status   Specimen Description BLOOD RIGHT HAND  Final   Special Requests BOTTLES DRAWN AEROBIC AND ANAEROBIC  5CC  Final   Culture NO GROWTH 2 DAYS  Final   Report Status PENDING  Incomplete    Radiology Reports Ct Abdomen Pelvis Wo Contrast  Result Date: 05/31/2016 CLINICAL DATA:  Hydronephrosis.  Generalized weakness. EXAM: CT ABDOMEN AND PELVIS WITHOUT CONTRAST TECHNIQUE: Multidetector  CT imaging of the abdomen and pelvis was performed following the standard protocol without IV contrast. COMPARISON:  Ultrasound same day FINDINGS: Lower chest: Linear atelectasis in both lower lobes. Moderate pericardial effusion. Cardiomegaly. No pleural effusion. Small hiatal hernia. Hepatobiliary: Liver parenchyma is normal. Small calcified stones or hyperdense sludge dependent within the gallbladder. There are multiple stones in the distal common bile duct without ductal dilatation. Pancreas: Normal.  Multiple stones in the distal common bile duct. Spleen: Normal Adrenals/Urinary Tract: Adrenal glands are normal. Left kidney is normal without contrast. Tiny exit fix cyst lower pole. Right kidney shows no significant parenchymal finding. There is fullness of the renal collecting system an extrarenal pelvis. The proximal  ureter is tortuous in the ureter is then dilated all the way to the bladder. No evidence of stone. The prostate gland is markedly enlarged. There are a few small bladder diverticulae. The bladder appears thick walled. Stomach/Bowel: No acute or significant bowel finding. Scattered colonic diverticulae without diverticulitis. Vascular/Lymphatic: Aortic atherosclerosis. No aneurysm. IVC is normal. No retroperitoneal mass or lymphadenopathy. Reproductive: None other significant. Other: Small inguinal hernia on the right containing fat. Musculoskeletal: Chronic lumbar degenerative changes. IMPRESSION: Moderate pericardial effusion. Cardiomegaly. Linear atelectasis or scarring at the lung bases. Gallstones/hyperdense sludge dependent in the gallbladder. Numerous small stones in the distal common bile duct. No evidence of ductal dilatation. Marked enlargement of the prostate. 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 Chronic appearing compensated hydroureteronephrosis on the right, presumably secondary to the chronic bladder disease. No evidence of urinary tract stone disease. Aortic atherosclerosis. Electronically Signed   By: Nelson Chimes M.D.   On: 05/31/2016 16:34   US Renal  Result Date: 05/31/2016 CLINICAL DATA:  Acute kidney injury. EXAM: RENAL / URINARY TRACT ULTRASOUND COMPLETE COMPARISON:  None. FINDINGS: Right Kidney: Length: 12.7 cm. Echogenicity within normal limits. No mass visualized. Moderate hydronephrosis is noted. Left Kidney: Length: 12.5 cm. Echogenicity within normal limits. No mass visualized. Mild hydronephrosis is noted. Bladder: Mobile debris or sludge is noted within the dependent portion of the urinary bladder. Left ureteral jet is noted. Right she at is not clearly identified. Mildly distended urinary bladder is noted. IMPRESSION: Moderate right hydronephrosis. Mild left hydronephrosis. Mildly distended urinary bladder is noted  with left ureteral jet visualized. Mobile debris or sludge is noted in dependent portion of urinary bladder lumen. Electronically Signed   By: Marijo Conception, M.D.   On: 05/31/2016 08:50   Dg Chest Portable 1 View  Result Date: 05/31/2016 CLINICAL DATA:  Initial evaluation for acute weakness. EXAM: PORTABLE CHEST 1 VIEW COMPARISON:  Prior radiograph from 12/19/2015. FINDINGS: Moderate cardiomegaly, stable from prior. Mediastinal silhouette within normal limits. Aortic atherosclerosis noted. Lungs hypoinflated. Secondary bibasilar and perihilar vascular congestion. Linear scarring noted within the right perihilar region, similar to previous. No pulmonary edema or pleural effusion. No definite focal infiltrates. No pneumothorax. No acute osseus abnormality. IMPRESSION: 1. Shallow lung inflation with secondary bibasilar/perihilar bronchovascular crowding. No other active cardiopulmonary disease. 2. Stable cardiomegaly. 3. Aortic atherosclerosis. Electronically Signed   By: Jeannine Boga M.D.   On: 05/31/2016 00:44     CBC  Recent Labs Lab 05/31/16 0015 06/01/16 0342 06/02/16 0331 06/03/16 0256  WBC 9.7 7.2 5.6 3.5*  HGB 13.9 12.4* 12.8* 12.7*  HCT 41.8 36.8* 39.0 37.9*  PLT 196 180 151 145*  MCV 94.4 94.4 94.4 94.0  MCH 31.4 31.8 31.0 31.5  MCHC 33.3 33.7 32.8 33.5  RDW 14.1  14.4 14.4 14.7  LYMPHSABS 0.9  --   --   --   MONOABS 1.7*  --   --   --   EOSABS 0.0  --   --   --   BASOSABS 0.1  --   --   --     Chemistries   Recent Labs Lab 05/31/16 0015 05/31/16 0538 05/31/16 0836 05/31/16 1444 06/01/16 0342 06/02/16 0331 06/03/16 0256  NA 141 141  --   --  139 139 135  K 3.7 3.2*  --   --  4.2 4.5 3.6  CL 103 100*  --   --  105 107 104  CO2 24 25  --   --  25 23 23   GLUCOSE 121* 113*  --   --  114* 98 128*  BUN 35* 37*  --   --  39* 23* 22*  CREATININE 2.14* 2.15*  --   --  1.89* 1.34* 1.24  CALCIUM 9.1 9.2  --   --  8.6* 8.4* 8.2*  AST  --   --  290* 324* 311* 285*  259*  ALT  --   --  226* 268* 280* 274* 245*  ALKPHOS  --   --  114 106 94 98 94  BILITOT  --   --  1.5* 0.8 1.2 1.0 0.8   ------------------------------------------------------------------------------------------------------------------ estimated creatinine clearance is 56.9 mL/min (by C-G formula based on SCr of 1.24 mg/dL). ------------------------------------------------------------------------------------------------------------------ No results for input(s): HGBA1C in the last 72 hours. ------------------------------------------------------------------------------------------------------------------ No results for input(s): CHOL, HDL, LDLCALC, TRIG, CHOLHDL, LDLDIRECT in the last 72 hours. ------------------------------------------------------------------------------------------------------------------ No results for input(s): TSH, T4TOTAL, T3FREE, THYROIDAB in the last 72 hours.  Invalid input(s): FREET3 ------------------------------------------------------------------------------------------------------------------ No results for input(s): VITAMINB12, FOLATE, FERRITIN, TIBC, IRON, RETICCTPCT in the last 72 hours.  Coagulation profile No results for input(s): INR, PROTIME in the last 168 hours.  No results for input(s): DDIMER in the last 72 hours.  Cardiac Enzymes  Recent Labs Lab 05/31/16 0538 05/31/16 0836 05/31/16 1756  TROPONINI 0.10* 0.10* 0.12*   ------------------------------------------------------------------------------------------------------------------ Invalid input(s): POCBNP   CBG: No results for input(s): GLUCAP in the last 168 hours.     Studies: No results found.    Lab Results  Component Value Date   HGBA1C 5.7 (H) 05/31/2016   HGBA1C 5.9 (H) 12/19/2015   Lab Results  Component Value Date   LDLCALC 93 12/20/2015   CREATININE 1.24 06/03/2016       Scheduled Meds: . apixaban  5 mg Oral BID  . cefTRIAXone (ROCEPHIN)  IV  1 g  Intravenous Q24H  . feeding supplement (ENSURE ENLIVE)  237 mL Oral Daily  . furosemide  40 mg Oral Daily  . hydrALAZINE  25 mg Oral Q8H  . tamsulosin  0.4 mg Oral Daily   Continuous Infusions:    LOS: 3 days    Time spent: >30 MINS    [facility]  [facility] Pager [phone]. If 7PM-7AM, please contact night-coverage at [email or web address], password [facility] 06/03/2016, 8:21 AM  LOS: 3 days

## 2016-06-04 NOTE — Progress Notes (Signed)
[facility] PROGRESS NOTE  Stephen Copeland HKV:[id number] DOB: 09/28/1940 DOA: 05/30/2016   PCP: No PCP Per Patient     Assessment/Plan: Principal Problem:   UTI (urinary tract infection) Active Problems:   Atrial fibrillation (HCC)   Hypertension   Elevated troponin   Chronic systolic CHF (congestive heart failure) (HCC)   Acute urinary retention   Acute kidney injury superimposed on chronic kidney disease (HCC)   Generalized weakness   Falls    75 y.o.malewith medical history significant of HTN, HLD, A. Fib with LBBB, systolic CHF with last EF 40%,dilated cardiomyopathy, pre-diabetes; who presents with complaints of at least 3 falls days.Patient was found to have a urinary tract infection, acute kidney injury with creatinine 2.14(baseline creatinine previously 1.1), and elevated troponin of 0.13. Bladder scan in the ED showed retention of 805 mL of urine. Patient admitted for UTI, possible sepsis, possible pyelonephritis, HCAP   Assessment and plan Urinary tract infection with hematuria/urinary retention  Continue telemetry,  blood culture no growth so far Urine culture insignificant growth Chest x-ray 4/1 suspicious for HCAP, antibiotics changed over to vancomycin and cefepime on 4/1 Continue anticoagulation for now as hematuria has resolved Renal ultrasound shows right-sided hydronephrosis CT abdomen pelvis  , shows enlargement of the prostrate compensated hydroureteronephrosis Consulted Dr Matilde Sprang for foley placement and hydronephrosis, he advised RN to attempt Foley based on outpatient cystoscopy results. Foley placed without any issues. Patient would need to follow-up with urology in the outpatient setting   HCAP Started vanc and cefepime 4/1   Acute kidney injury on chronic kidney disease stage III secondary to obstructive uropathy:Baseline creatinine previously 1.07-1.15, but patient presents with elevated creatinine of 2.14 and a BUN of 35.   Creatinine improving after Foley placement, creatinine down to 1.34>1.24 Follow BMP    Urinary retention 2/2 BPH: Patient noted to have over 800 mL of urine present post void on bladder scan.Suspect secondary to BPH. Continue Flomax Continue Foley catheter at discharge    Transaminitis secondary to hepatic congestion, due to CHF Gallstones/hyperdense sludge dependent in the gallbladder. Numerous small stones in the distal common bile duct. No evidence of ductal dilatation. Acute Hepatitis panel negative Lipitor on hold, CK within normal limits Amiodarone held because of worsening LFTs Repeat liver function tomorrow  Generalized weakness with falls, likely secondary to the above - Physical therapy /OT-SNF  Elevated troponins in the setting of known cardiomyopathy/acute kidney injury: Patient has history of elevated troponin suspect symptoms could likely exacerbated with kidney failure.  , it appears that the patient was previously set up multiple times for left heart cath,but they were subsequent canceled. troponins improving, consider outpatient myovue as per cardiology Troponin levels were elevated, but in the absence of any active anginal symptoms, this is probably likely just mild troponin leak from systemic illness. This goes along with the elevated LFTs. Hold metoprolol in the setting of bradycardia   Atrial fibrillation:CHADSVASC is at least 3. Continue Eliquis, metoprolol and amiodarone on hold due to bradycardia  H/O systolic CHF with dilated cardiomyopathy/moderate pericardial effusion on CT  Repeat 2-D echo , EF 35-40%, moderate pulmonary hypertension, echo negative for pericardial effusion hold metoprolol given bradycardia seems relatively euvolemic (chest x-ray showed no active pulmonary edema) ACE inhibitor being held for renal insufficiency and borderline blood pressures in the setting of ongoing infection Continue Lasix 40 mg a day   Essential  hypertension Continue to hold lisinopril secondary to acute kidney injury  Prediabetes Monitor CBGs  Hemoglobin A1c 5.7  Low TSH-  free T4 1.6   DVT prophylaxsis eliquis  Code Status:  Full code     Family Communication: Discussed in detail with the patient, tried to call the wife and leave voice message 3 times, voice mailbox full, all imaging results, lab results explained to the patient   Disposition Plan:   SNF in one to 2 days      Consultants:  Cardiology  Procedures:  None  Antibiotics: Anti-infectives    Start     Dose/Rate Route Frequency Ordered Stop   06/03/16 1330  vancomycin (VANCOCIN) IVPB 750 mg/150 ml premix     750 mg 150 mL/hr over 60 Minutes Intravenous Every 12 hours 06/03/16 1240     06/03/16 1300  ceFEPIme (MAXIPIME) 2 g in dextrose 5 % 50 mL IVPB     2 g 100 mL/hr over 30 Minutes Intravenous Every 12 hours 06/03/16 1227     06/03/16 1245  vancomycin (VANCOCIN) IVPB 750 mg/150 ml premix  Status:  Discontinued     750 mg 150 mL/hr over 60 Minutes Intravenous Every 12 hours 06/03/16 1239 06/03/16 1240   05/31/16 2200  cefTRIAXone (ROCEPHIN) 1 g in dextrose 5 % 50 mL IVPB  Status:  Discontinued     1 g 100 mL/hr over 30 Minutes Intravenous Every 24 hours 05/31/16 0530 06/03/16 1227   05/31/16 0300  cefTRIAXone (ROCEPHIN) 1 g in dextrose 5 % 50 mL IVPB     1 g 100 mL/hr over 30 Minutes Intravenous  Once 05/31/16 0249 05/31/16 0341         HPI/Subjective:  cough , nonproductive , Better than yesterday after being started on broad-spectrum antibiotics, afebrile overnight  Objective: Vitals:   06/03/16 2027 06/03/16 2204 06/03/16 2205 06/04/16 0554  BP: 120/71 126/65 126/65 120/63  Pulse:    64  Resp:    16  Temp:    97.7 F (36.5 C)  TempSrc:    Axillary  SpO2: 90%  94% 95%  Weight:    77.6 kg (171 lb 1.2 oz)  Height:        Intake/Output Summary (Last 24 hours) at 06/04/16 0902 Last data filed at 06/04/16 0300  Gross per 24  hour  Intake              760 ml  Output             1550 ml  Net             -790 ml    Exam:  Examination:  General exam: Appears calm and comfortable  Respiratory system: Clear to auscultation. Respiratory effort normal. Cardiovascular system: S1 & S2 heard, RRR. No JVD, murmurs, rubs, gallops or clicks. No pedal edema. Gastrointestinal system: Abdomen is nondistended, soft and nontender. No organomegaly or masses felt. Normal bowel sounds heard. Central nervous system: Alert and oriented. No focal neurological deficits. Extremities: Symmetric 5 x 5 power. Skin: No rashes, lesions or ulcers Psychiatry: Judgement and insight appear normal. Mood & affect appropriate.     Data Reviewed: I have personally reviewed following labs and imaging studies  Micro Results Recent Results (from the past 240 hour(s))  Culture, blood (Routine X 2) w Reflex to ID Panel     Status: None (Preliminary result)   Collection Time: 05/31/16  8:49 AM  Result Value Ref Range Status   Specimen Description BLOOD RIGHT WRIST  Final   Special Requests BOTTLES DRAWN AEROBIC AND ANAEROBIC  5CC  Final   Culture NO GROWTH 3 DAYS  Final   Report Status PENDING  Incomplete  Culture, blood (Routine X 2) w Reflex to ID Panel     Status: None (Preliminary result)   Collection Time: 05/31/16  8:54 AM  Result Value Ref Range Status   Specimen Description BLOOD RIGHT HAND  Final   Special Requests BOTTLES DRAWN AEROBIC AND ANAEROBIC  5CC  Final   Culture NO GROWTH 3 DAYS  Final   Report Status PENDING  Incomplete    Radiology Reports Ct Abdomen Pelvis Wo Contrast  Result Date: 05/31/2016 CLINICAL DATA:  Hydronephrosis.  Generalized weakness. EXAM: CT ABDOMEN AND PELVIS WITHOUT CONTRAST TECHNIQUE: Multidetector CT imaging of the abdomen and pelvis was performed following the standard protocol without IV contrast. COMPARISON:  Ultrasound same day FINDINGS: Lower chest: Linear atelectasis in both lower lobes.  Moderate pericardial effusion. Cardiomegaly. No pleural effusion. Small hiatal hernia. Hepatobiliary: Liver parenchyma is normal. Small calcified stones or hyperdense sludge dependent within the gallbladder. There are multiple stones in the distal common bile duct without ductal dilatation. Pancreas: Normal.  Multiple stones in the distal common bile duct. Spleen: Normal Adrenals/Urinary Tract: Adrenal glands are normal. Left kidney is normal without contrast. Tiny exit fix cyst lower pole. Right kidney shows no significant parenchymal finding. There is fullness of the renal collecting system an extrarenal pelvis. The proximal ureter is tortuous in the ureter is then dilated all the way to the bladder. No evidence of stone. The prostate gland is markedly enlarged. There are a few small bladder diverticulae. The bladder appears thick walled. Stomach/Bowel: No acute or significant bowel finding. Scattered colonic diverticulae without diverticulitis. Vascular/Lymphatic: Aortic atherosclerosis. No aneurysm. IVC is normal. No retroperitoneal mass or lymphadenopathy. Reproductive: None other significant. Other: Small inguinal hernia on the right containing fat. Musculoskeletal: Chronic lumbar degenerative changes. IMPRESSION: Moderate pericardial effusion. Cardiomegaly. Linear atelectasis or scarring at the lung bases. Gallstones/hyperdense sludge dependent in the gallbladder. Numerous small stones in the distal common bile duct. No evidence of ductal dilatation. Marked enlargement of the prostate. 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 Chronic appearing compensated hydroureteronephrosis on the right, presumably secondary to the chronic bladder disease. No evidence of urinary tract stone disease. Aortic atherosclerosis. Electronically Signed   By: Nelson Chimes M.D.   On: 05/31/2016 16:34   US Renal  Result Date: 05/31/2016 CLINICAL DATA:  Acute kidney  injury. EXAM: RENAL / URINARY TRACT ULTRASOUND COMPLETE COMPARISON:  None. FINDINGS: Right Kidney: Length: 12.7 cm. Echogenicity within normal limits. No mass visualized. Moderate hydronephrosis is noted. Left Kidney: Length: 12.5 cm. Echogenicity within normal limits. No mass visualized. Mild hydronephrosis is noted. Bladder: Mobile debris or sludge is noted within the dependent portion of the urinary bladder. Left ureteral jet is noted. Right she at is not clearly identified. Mildly distended urinary bladder is noted. IMPRESSION: Moderate right hydronephrosis. Mild left hydronephrosis. Mildly distended urinary bladder is noted with left ureteral jet visualized. Mobile debris or sludge is noted in dependent portion of urinary bladder lumen. Electronically Signed   By: Marijo Conception, M.D.   On: 05/31/2016 08:50   Dg Chest Port 1 View  Result Date: 06/03/2016 CLINICAL DATA:  SOB/cough x3-4 weeks. Pt said he gets up very little phlegm when coughing. Hx of acid reflux, hiatal hernia, hypertension, sinus bradycardia. No surgery to chest. Nonsmoker. EXAM: PORTABLE CHEST 1 VIEW COMPARISON:  05/31/2016 FINDINGS: Cardiac silhouette is borderline enlarged. No  mediastinal or hilar masses. No convincing adenopathy. There is left greater than right lung base opacity. On the left this could reflect pneumonia or atelectasis. This has the appearance of atelectasis on the right. Remainder of the lungs is clear. No convincing pleural effusion.  No pneumothorax. IMPRESSION: 1. Possible pneumonia versus atelectasis at the left lung base. 2. Mild right lung base atelectasis. 3. Findings are similar to the prior study. Electronically Signed   By: Lajean Manes M.D.   On: 06/03/2016 10:49   Dg Chest Portable 1 View  Result Date: 05/31/2016 CLINICAL DATA:  Initial evaluation for acute weakness. EXAM: PORTABLE CHEST 1 VIEW COMPARISON:  Prior radiograph from 12/19/2015. FINDINGS: Moderate cardiomegaly, stable from prior.  Mediastinal silhouette within normal limits. Aortic atherosclerosis noted. Lungs hypoinflated. Secondary bibasilar and perihilar vascular congestion. Linear scarring noted within the right perihilar region, similar to previous. No pulmonary edema or pleural effusion. No definite focal infiltrates. No pneumothorax. No acute osseus abnormality. IMPRESSION: 1. Shallow lung inflation with secondary bibasilar/perihilar bronchovascular crowding. No other active cardiopulmonary disease. 2. Stable cardiomegaly. 3. Aortic atherosclerosis. Electronically Signed   By: Jeannine Boga M.D.   On: 05/31/2016 00:44     CBC  Recent Labs Lab 05/31/16 0015 06/01/16 0342 06/02/16 0331 06/03/16 0256  WBC 9.7 7.2 5.6 3.5*  HGB 13.9 12.4* 12.8* 12.7*  HCT 41.8 36.8* 39.0 37.9*  PLT 196 180 151 145*  MCV 94.4 94.4 94.4 94.0  MCH 31.4 31.8 31.0 31.5  MCHC 33.3 33.7 32.8 33.5  RDW 14.1 14.4 14.4 14.7  LYMPHSABS 0.9  --   --   --   MONOABS 1.7*  --   --   --   EOSABS 0.0  --   --   --   BASOSABS 0.1  --   --   --     Chemistries   Recent Labs Lab 05/31/16 0015 05/31/16 0538 05/31/16 0836 05/31/16 1444 06/01/16 0342 06/02/16 0331 06/03/16 0256  NA 141 141  --   --  139 139 135  K 3.7 3.2*  --   --  4.2 4.5 3.6  CL 103 100*  --   --  105 107 104  CO2 24 25  --   --  25 23 23   GLUCOSE 121* 113*  --   --  114* 98 128*  BUN 35* 37*  --   --  39* 23* 22*  CREATININE 2.14* 2.15*  --   --  1.89* 1.34* 1.24  CALCIUM 9.1 9.2  --   --  8.6* 8.4* 8.2*  AST  --   --  290* 324* 311* 285* 259*  ALT  --   --  226* 268* 280* 274* 245*  ALKPHOS  --   --  114 106 94 98 94  BILITOT  --   --  1.5* 0.8 1.2 1.0 0.8   ------------------------------------------------------------------------------------------------------------------ estimated creatinine clearance is 56.5 mL/min (by C-G formula based on SCr of 1.24  mg/dL). ------------------------------------------------------------------------------------------------------------------ No results for input(s): HGBA1C in the last 72 hours. ------------------------------------------------------------------------------------------------------------------ No results for input(s): CHOL, HDL, LDLCALC, TRIG, CHOLHDL, LDLDIRECT in the last 72 hours. ------------------------------------------------------------------------------------------------------------------ No results for input(s): TSH, T4TOTAL, T3FREE, THYROIDAB in the last 72 hours.  Invalid input(s): FREET3 ------------------------------------------------------------------------------------------------------------------ No results for input(s): VITAMINB12, FOLATE, FERRITIN, TIBC, IRON, RETICCTPCT in the last 72 hours.  Coagulation profile No results for input(s): INR, PROTIME in the last 168 hours.  No results for input(s): DDIMER in the last 72 hours.  Cardiac  Enzymes  Recent Labs Lab 05/31/16 0538 05/31/16 0836 05/31/16 1756  TROPONINI 0.10* 0.10* 0.12*   ------------------------------------------------------------------------------------------------------------------ Invalid input(s): POCBNP   CBG: No results for input(s): GLUCAP in the last 168 hours.     Studies: Dg Chest Port 1 View  Result Date: 06/03/2016 CLINICAL DATA:  SOB/cough x3-4 weeks. Pt said he gets up very little phlegm when coughing. Hx of acid reflux, hiatal hernia, hypertension, sinus bradycardia. No surgery to chest. Nonsmoker. EXAM: PORTABLE CHEST 1 VIEW COMPARISON:  05/31/2016 FINDINGS: Cardiac silhouette is borderline enlarged. No mediastinal or hilar masses. No convincing adenopathy. There is left greater than right lung base opacity. On the left this could reflect pneumonia or atelectasis. This has the appearance of atelectasis on the right. Remainder of the lungs is clear. No convincing pleural effusion.  No  pneumothorax. IMPRESSION: 1. Possible pneumonia versus atelectasis at the left lung base. 2. Mild right lung base atelectasis. 3. Findings are similar to the prior study. Electronically Signed   By: Lajean Manes M.D.   On: 06/03/2016 10:49      Lab Results  Component Value Date   HGBA1C 5.7 (H) 05/31/2016   HGBA1C 5.9 (H) 12/19/2015   Lab Results  Component Value Date   LDLCALC 93 12/20/2015   CREATININE 1.24 06/03/2016       Scheduled Meds: . apixaban  5 mg Oral BID  . ceFEPime (MAXIPIME) IV  2 g Intravenous Q12H  . feeding supplement (ENSURE ENLIVE)  237 mL Oral Daily  . furosemide  40 mg Oral Daily  . hydrALAZINE  25 mg Oral Q8H  . sodium chloride flush  3 mL Intravenous Q12H  . tamsulosin  0.4 mg Oral Daily  . vancomycin  750 mg Intravenous Q12H   Continuous Infusions:    LOS: 4 days    Time spent: >30 MINS    [facility]  [facility] Pager [phone]. If 7PM-7AM, please contact night-coverage at [email or web address], password [facility] 06/04/2016, 9:02 AM  LOS: 4 days

## 2016-06-04 NOTE — Progress Notes (Signed)
   06/04/16 1031  Clinical Encounter Type  Visited With Patient  Visit Type Initial  Referral From Nurse  Consult/Referral To Chaplain  Recommendations follow up as needed  Spiritual Encounters  Spiritual Needs Prayer  Stress Factors  Patient Stress Factors None identified  Pt not available at this time, nurse working with morning routine, will follow up as needed. Chaplain Kary Sugrue A. Dosha Broshears [phone]

## 2016-06-04 NOTE — Care Management Important Message (Signed)
Important Message  Patient Details  Name: Stephen Copeland MRN: [id number] Date of Birth: [birth year]   Medicare Important Message Given:  Yes    Gerhart Ruggieri [address] 06/04/2016, 4:35 PM

## 2016-06-04 NOTE — Progress Notes (Signed)
qPhysical Therapy Treatment Patient Details Name: Stephen Copeland MRN: [id number] DOB: 1941-03-03 Today's Date: 06/04/2016    History of Present Illness pt presents with UTI and 3 recent falls.  pt with hx of HTN, Scoliosis, A-fib, and CHF.      PT Comments    Pt continues to be weak and deconditioned, requiring A for all aspects of mobility.  Pt did attempt to ambulate, however fatigued after only 3'.  Continue to feel pt will need SNF level of care at D/C.  Will continue to follow.     Follow Up Recommendations  SNF     Equipment Recommendations  None recommended by PT    Recommendations for Other Services       Precautions / Restrictions Precautions Precautions: Fall Restrictions Weight Bearing Restrictions: No    Mobility  Bed Mobility Overal bed mobility: Needs Assistance Bed Mobility: Supine to Sit     Supine to sit: Mod assist     General bed mobility comments: pt moves slowly and with effort, but is able to come to sitting at EOB with only A at trunk for support.    Transfers Overall transfer level: Needs assistance Equipment used: Rolling walker (2 wheeled) Transfers: Sit to/from Stand Sit to Stand: Mod assist;+2 safety/equipment         General transfer comment: cues for UE use and positioning once in standing.    Ambulation/Gait Ambulation/Gait assistance: Mod assist;+2 safety/equipment Ambulation Distance (Feet): 3 Feet Assistive device: Rolling walker (2 wheeled) Gait Pattern/deviations: Step-through pattern;Decreased stride length;Shuffle;Trunk flexed;Narrow base of support     General Gait Details: pt attemtped to ambulate, but only made it 3' before needing chair to sit despite fatigue.  2nd person present for close chair follow.     Stairs            Wheelchair Mobility    Modified Rankin (Stroke Patients Only)       Balance Overall balance assessment: Needs assistance;History of Falls Sitting-balance support: No upper  extremity supported;Feet supported Sitting balance-Leahy Scale: Fair     Standing balance support: Bilateral upper extremity supported;During functional activity Standing balance-Leahy Scale: Poor Standing balance comment: Heavy reliance on UE support.                            Cognition Arousal/Alertness: Awake/alert Behavior During Therapy: WFL for tasks assessed/performed Overall Cognitive Status: Within Functional Limits for tasks assessed                                        Exercises      General Comments        Pertinent Vitals/Pain Pain Assessment: Faces Faces Pain Scale: Hurts even more Pain Location: Bil knees, L worse than R Pain Descriptors / Indicators: Grimacing;Guarding;Sore Pain Intervention(s): Monitored during session;Premedicated before session;Repositioned    Home Living                      Prior Function            PT Goals (current goals can now be found in the care plan section) Acute Rehab PT Goals Patient Stated Goal: Stop falling PT Goal Formulation: With patient Time For Goal Achievement: 06/14/16 Potential to Achieve Goals: Good Progress towards PT goals: Progressing toward goals    Frequency    Min 2X/week  PT Plan Frequency needs to be updated    Co-evaluation             End of Session Equipment Utilized During Treatment: Gait belt;Oxygen Activity Tolerance: Patient limited by pain;Patient limited by fatigue Patient left: in chair;with call bell/phone within reach;with chair alarm set Nurse Communication: Mobility status PT Visit Diagnosis: Unsteadiness on feet (R26.81);Pain Pain - Right/Left: Left Pain - part of body: Knee     Time: 3013-1438 PT Time Calculation (min) (ACUTE ONLY): 20 min  Charges:  $Gait Training: 8-22 mins                    G CodesCatarina Hartshorn, [place] [phone] 06/04/2016, 10:06 AM

## 2016-06-05 LAB — COMPREHENSIVE METABOLIC PANEL
ALK PHOS: 97 U/L (ref 38–126)
ALT: 159 U/L — AB (ref 17–63)
AST: 128 U/L — AB (ref 15–41)
Albumin: 2.7 g/dL — ABNORMAL LOW (ref 3.5–5.0)
Anion gap: 11 (ref 5–15)
BILIRUBIN TOTAL: 1.2 mg/dL (ref 0.3–1.2)
BUN: 24 mg/dL — ABNORMAL HIGH (ref 6–20)
CALCIUM: 8.6 mg/dL — AB (ref 8.9–10.3)
CHLORIDE: 101 mmol/L (ref 101–111)
CO2: 26 mmol/L (ref 22–32)
Creatinine, Ser: 1.21 mg/dL (ref 0.61–1.24)
GFR, EST NON AFRICAN AMERICAN: 57 mL/min — AB (ref >60)
Glucose, Bld: 120 mg/dL — ABNORMAL HIGH (ref 65–99)
Potassium: 3.5 mmol/L (ref 3.5–5.1)
Sodium: 138 mmol/L (ref 135–145)
TOTAL PROTEIN: 5.4 g/dL — AB (ref 6.5–8.1)

## 2016-06-05 LAB — CULTURE, BLOOD (ROUTINE X 2)
CULTURE: NO GROWTH
CULTURE: NO GROWTH

## 2016-06-05 MED ORDER — HYDRALAZINE HCL 10 MG PO TABS
10.0000 mg | ORAL_TABLET | Freq: Three times a day (TID) | ORAL | Status: DC
  Administered 2016-06-05 – 2016-06-06 (×3): 10 mg via ORAL
  Filled 2016-06-05 (×3): qty 1

## 2016-06-05 NOTE — Progress Notes (Signed)
Clinical Social Worker met patient at bedside to offer support and discharge needs. Patient stated he lives at home with wife but wife has stopped answering phone calls. Per NCM she will contact Sheriff to do a wellness check for patient spouse. Patient stated he does not want to go to a SNF but would rather go home with Home Health. CSW is signing off as patient does not have any CSW needs. Re-consult CSW if new needs arise.  Rhea Pink, MSW,  [place]

## 2016-06-05 NOTE — Care Management Note (Addendum)
Case Management Note  Patient Details  Name: Stephen Copeland MRN: [id number] Date of Birth: 24-Jun-1940  Subjective/Objective:  UTI, CHF                Action/Plan: Discharge Planning:  NCM spoke to pt and he is refusing SNF. Lives at home with elderly wife who is limited in the assistance she can provide at home. Offered choice for HH/list provided. Pt agreeable to [facility] for HH. Contacted [facility] Liaison with new referral. Arranged appt with Ivar Drape PA at [facility] for 06/08/2016 at 945 am. [facility] DME rep for delivery of oxygen and RW to home this evening. Scheduled dc in am via PTAR.   PCP Colletta [place] English PA  06/05/2016 Pt states he goes to the [facility]. [place] [address], #[phone]  Anderson Malta left message for return call.    Expected Discharge Date:                  Expected Discharge Plan:  [place]  In-House Referral:  Clinical Social Work  Discharge planning Services  CM Consult  Post Acute Care Choice:  Home Health Choice offered to:  Patient  DME Arranged:  Walker rolling, Oxygen DME Agency:  [place] Arranged:  RN, PT, OT, Nurse's Aide, Social Work, Refused SNF [place] Agency:  [place]  Status of Service:  Completed, signed off  If discussed at [employer], dates discussed:    Additional Comments:  Erenest Rasher, RN 06/05/2016, 3:42 PM

## 2016-06-05 NOTE — Progress Notes (Signed)
SATURATION QUALIFICATIONS: (This note is used to comply with regulatory documentation for home oxygen)  Patient Saturations on Room Air at Rest = 88%  Patient Saturations on Room Air while Ambulating =80%  Patient Saturations on 3 Liters of oxygen while Ambulating =88% Patient Saturations on 4 Liters of oxygen while Ambulating = 91%   Please briefly explain why patient needs home oxygen:

## 2016-06-05 NOTE — Progress Notes (Addendum)
[facility] PROGRESS NOTE  Stephen Copeland KGU:[id number] DOB: [birth year] DOA: 05/30/2016   PCP: No PCP Per Patient     Assessment/Plan: Principal Problem:   UTI (urinary tract infection) Active Problems:   Atrial fibrillation (HCC)   Hypertension   Elevated troponin   Chronic systolic CHF (congestive heart failure) (HCC)   Acute urinary retention   Acute kidney injury superimposed on chronic kidney disease (HCC)   Generalized weakness   Falls    75 y.o.malewith medical history significant of HTN, HLD, A. Fib with LBBB, systolic CHF with last EF 40%,dilated cardiomyopathy, pre-diabetes; who presents with complaints of at least 3 falls days.Patient was found to have a urinary tract infection, acute kidney injury with creatinine 2.14(baseline creatinine previously 1.1), and elevated troponin of 0.13. Bladder scan in the ED showed retention of 805 mL of urine. Patient admitted for UTI, possible sepsis, possible pyelonephritis, HCAP. Pending decision about SNF   Assessment and plan Urinary tract infection with hematuria/urinary retention /acute kidney injury Blood culture no growth so far Urine culture insignificant growth Chest x-ray 4/1 suspicious for HCAP, antibiotics changed over to vancomycin and cefepime on 4/1, Will continue for another 24 hrs, then change to omnicef for 5 more days  Continue anticoagulation [patient on eliquis ) as  hematuria has resolved Renal ultrasound showed  right-sided hydronephrosis CT abdomen pelvis  , shows enlargement of the prostrate compensated hydroureteronephrosis Consulted Dr Matilde Sprang for foley placement and hydronephrosis, he advised RN to attempt Foley based on outpatient cystoscopy results. Foley placed without any issues. Patient would need to follow-up with urology in the outpatient setting   HCAP Started vanc and cefepime 4/1. Continue broad-spectrum antibiotics for another 24 hours and then narrow SLP  eval   Acute kidney injury on chronic kidney disease stage III secondary to obstructive uropathy:Baseline creatinine previously 1.07-1.15, but patient presents with elevated creatinine of 2.14 and a BUN of 35.  Creatinine improving after Foley placement, creatinine down to 1.34>1.24>1.21 Follow BMP    Urinary retention 2/2 BPH: Patient noted to have over 800 mL of urine present post void on bladder scan on admission.Suspect secondary to BPH. Continue Flomax Continue Foley catheter at discharge and follow-up with urology    Transaminitis secondary to hepatic congestion, due to CHF, improving Gallstones/hyperdense sludge dependent in the gallbladder. Numerous small stones in the distal common bile duct. No evidence of ductal dilatation. Acute Hepatitis panel negative Lipitor on hold, CK within normal limits Amiodarone held because of worsening LFTs Repeat liver function tomorrow  Generalized weakness with falls, multifactorial likely secondary to the above - Physical therapy /OT-SNF Patient declines SNF Unable to get a hold of wife Multiple consults placed for social work for several days now  Elevated troponins in the setting of known cardiomyopathy/acute kidney injury: Patient has history of elevated troponin suspect symptoms could likely exacerbated with kidney failure.  , it appears that the patient was previously set up multiple times for left heart cath,but they were subsequent canceled. troponins improving, consider outpatient myovue as per cardiology Troponin levels were elevated, but in the absence of any active anginal symptoms, this is probably likely just mild troponin leak from systemic illness. This goes along with the elevated LFTs. Hold metoprolol in the setting of bradycardia   Atrial fibrillation:CHADSVASC is at least 3. Continue Eliquis, metoprolol and amiodarone on hold due to bradycardia  H/O systolic CHF with dilated cardiomyopathy/moderate pericardial  effusion on CT  Repeat 2-D echo , EF 35-40%,  moderate pulmonary hypertension, echo negative for pericardial effusion hold metoprolol given bradycardia seems relatively euvolemic (chest x-ray showed no active pulmonary edema) ACE inhibitor being held for renal insufficiency and borderline blood pressures in the setting of ongoing infection Continue Lasix 40 mg a day   Essential hypertension Continue to hold lisinopril secondary to acute kidney injury Reduced dose of hydralazine due to borderline low blood pressure  Prediabetes Monitor CBGs  Hemoglobin A1c 5.7  Low TSH-  free T4 1.6   DVT prophylaxsis eliquis  Code Status:  Full code     Family Communication: Discussed in detail with the patient, tried to call the wife and leave voice message 3 times, voice mailbox full, all imaging results, lab results explained to the patient . Patient declines SNF  Disposition Plan: DC with   [place] in am      Consultants:  Cardiology  Procedures:  None  Antibiotics: Anti-infectives    Start     Dose/Rate Route Frequency Ordered Stop   06/03/16 1330  vancomycin (VANCOCIN) IVPB 750 mg/150 ml premix     750 mg 150 mL/hr over 60 Minutes Intravenous Every 12 hours 06/03/16 1240     06/03/16 1300  ceFEPIme (MAXIPIME) 2 g in dextrose 5 % 50 mL IVPB     2 g 100 mL/hr over 30 Minutes Intravenous Every 12 hours 06/03/16 1227     06/03/16 1245  vancomycin (VANCOCIN) IVPB 750 mg/150 ml premix  Status:  Discontinued     750 mg 150 mL/hr over 60 Minutes Intravenous Every 12 hours 06/03/16 1239 06/03/16 1240   05/31/16 2200  cefTRIAXone (ROCEPHIN) 1 g in dextrose 5 % 50 mL IVPB  Status:  Discontinued     1 g 100 mL/hr over 30 Minutes Intravenous Every 24 hours 05/31/16 0530 06/03/16 1227   05/31/16 0300  cefTRIAXone (ROCEPHIN) 1 g in dextrose 5 % 50 mL IVPB     1 g 100 mL/hr over 30 Minutes Intravenous  Once 05/31/16 0249 05/31/16 0341         HPI/Subjective:  Breathing has improved  overnight, cough is improved  Objective: Vitals:   06/04/16 2027 06/04/16 2258 06/05/16 0323 06/05/16 0547  BP: 107/67 117/72 113/74 125/66  Pulse: 74  67   Resp: 16  18   Temp: 98.4 F (36.9 C)  97.5 F (36.4 C)   TempSrc: Oral  Oral   SpO2: 90%  91%   Weight:   75.3 kg (166 lb 1.6 oz)   Height:        Intake/Output Summary (Last 24 hours) at 06/05/16 0834 Last data filed at 06/05/16 0547  Gross per 24 hour  Intake              170 ml  Output             2500 ml  Net            -2330 ml    Exam:  Examination:  General exam: Appears calm and comfortable  Respiratory system: Clear to auscultation. Respiratory effort normal. Cardiovascular system: S1 & S2 heard, RRR. No JVD, murmurs, rubs, gallops or clicks. No pedal edema. Gastrointestinal system: Abdomen is nondistended, soft and nontender. No organomegaly or masses felt. Normal bowel sounds heard. Central nervous system: Alert and oriented. No focal neurological deficits. Extremities: Symmetric 5 x 5 power. Skin: No rashes, lesions or ulcers Psychiatry: Judgement and insight appear normal. Mood & affect appropriate.     Data Reviewed: I  have personally reviewed following labs and imaging studies  Micro Results Recent Results (from the past 240 hour(s))  Culture, blood (Routine X 2) w Reflex to ID Panel     Status: None (Preliminary result)   Collection Time: 05/31/16  8:49 AM  Result Value Ref Range Status   Specimen Description BLOOD RIGHT WRIST  Final   Special Requests BOTTLES DRAWN AEROBIC AND ANAEROBIC  5CC  Final   Culture NO GROWTH 4 DAYS  Final   Report Status PENDING  Incomplete  Culture, blood (Routine X 2) w Reflex to ID Panel     Status: None (Preliminary result)   Collection Time: 05/31/16  8:54 AM  Result Value Ref Range Status   Specimen Description BLOOD RIGHT HAND  Final   Special Requests BOTTLES DRAWN AEROBIC AND ANAEROBIC  5CC  Final   Culture NO GROWTH 4 DAYS  Final   Report Status  PENDING  Incomplete    Radiology Reports Ct Abdomen Pelvis Wo Contrast  Result Date: 05/31/2016 CLINICAL DATA:  Hydronephrosis.  Generalized weakness. EXAM: CT ABDOMEN AND PELVIS WITHOUT CONTRAST TECHNIQUE: Multidetector CT imaging of the abdomen and pelvis was performed following the standard protocol without IV contrast. COMPARISON:  Ultrasound same day FINDINGS: Lower chest: Linear atelectasis in both lower lobes. Moderate pericardial effusion. Cardiomegaly. No pleural effusion. Small hiatal hernia. Hepatobiliary: Liver parenchyma is normal. Small calcified stones or hyperdense sludge dependent within the gallbladder. There are multiple stones in the distal common bile duct without ductal dilatation. Pancreas: Normal.  Multiple stones in the distal common bile duct. Spleen: Normal Adrenals/Urinary Tract: Adrenal glands are normal. Left kidney is normal without contrast. Tiny exit fix cyst lower pole. Right kidney shows no significant parenchymal finding. There is fullness of the renal collecting system an extrarenal pelvis. The proximal ureter is tortuous in the ureter is then dilated all the way to the bladder. No evidence of stone. The prostate gland is markedly enlarged. There are a few small bladder diverticulae. The bladder appears thick walled. Stomach/Bowel: No acute or significant bowel finding. Scattered colonic diverticulae without diverticulitis. Vascular/Lymphatic: Aortic atherosclerosis. No aneurysm. IVC is normal. No retroperitoneal mass or lymphadenopathy. Reproductive: None other significant. Other: Small inguinal hernia on the right containing fat. Musculoskeletal: Chronic lumbar degenerative changes. IMPRESSION: Moderate pericardial effusion. Cardiomegaly. Linear atelectasis or scarring at the lung bases. Gallstones/hyperdense sludge dependent in the gallbladder. Numerous small stones in the distal common bile duct. No evidence of ductal dilatation. Marked enlargement of the prostate.  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 Chronic appearing compensated hydroureteronephrosis on the right, presumably secondary to the chronic bladder disease. No evidence of urinary tract stone disease. Aortic atherosclerosis. Electronically Signed   By: Nelson Chimes M.D.   On: 05/31/2016 16:34   US Renal  Result Date: 05/31/2016 CLINICAL DATA:  Acute kidney injury. EXAM: RENAL / URINARY TRACT ULTRASOUND COMPLETE COMPARISON:  None. FINDINGS: Right Kidney: Length: 12.7 cm. Echogenicity within normal limits. No mass visualized. Moderate hydronephrosis is noted. Left Kidney: Length: 12.5 cm. Echogenicity within normal limits. No mass visualized. Mild hydronephrosis is noted. Bladder: Mobile debris or sludge is noted within the dependent portion of the urinary bladder. Left ureteral jet is noted. Right she at is not clearly identified. Mildly distended urinary bladder is noted. IMPRESSION: Moderate right hydronephrosis. Mild left hydronephrosis. Mildly distended urinary bladder is noted with left ureteral jet visualized. Mobile debris or sludge is noted in dependent portion of urinary bladder lumen. Electronically  Signed   By: Marijo Conception, M.D.   On: 05/31/2016 08:50   Dg Chest Port 1 View  Result Date: 06/03/2016 CLINICAL DATA:  SOB/cough x3-4 weeks. Pt said he gets up very little phlegm when coughing. Hx of acid reflux, hiatal hernia, hypertension, sinus bradycardia. No surgery to chest. Nonsmoker. EXAM: PORTABLE CHEST 1 VIEW COMPARISON:  05/31/2016 FINDINGS: Cardiac silhouette is borderline enlarged. No mediastinal or hilar masses. No convincing adenopathy. There is left greater than right lung base opacity. On the left this could reflect pneumonia or atelectasis. This has the appearance of atelectasis on the right. Remainder of the lungs is clear. No convincing pleural effusion.  No pneumothorax. IMPRESSION: 1. Possible pneumonia versus  atelectasis at the left lung base. 2. Mild right lung base atelectasis. 3. Findings are similar to the prior study. Electronically Signed   By: Lajean Manes M.D.   On: 06/03/2016 10:49   Dg Chest Portable 1 View  Result Date: 05/31/2016 CLINICAL DATA:  Initial evaluation for acute weakness. EXAM: PORTABLE CHEST 1 VIEW COMPARISON:  Prior radiograph from 12/19/2015. FINDINGS: Moderate cardiomegaly, stable from prior. Mediastinal silhouette within normal limits. Aortic atherosclerosis noted. Lungs hypoinflated. Secondary bibasilar and perihilar vascular congestion. Linear scarring noted within the right perihilar region, similar to previous. No pulmonary edema or pleural effusion. No definite focal infiltrates. No pneumothorax. No acute osseus abnormality. IMPRESSION: 1. Shallow lung inflation with secondary bibasilar/perihilar bronchovascular crowding. No other active cardiopulmonary disease. 2. Stable cardiomegaly. 3. Aortic atherosclerosis. Electronically Signed   By: Jeannine Boga M.D.   On: 05/31/2016 00:44     CBC  Recent Labs Lab 05/31/16 0015 06/01/16 0342 06/02/16 0331 06/03/16 0256  WBC 9.7 7.2 5.6 3.5*  HGB 13.9 12.4* 12.8* 12.7*  HCT 41.8 36.8* 39.0 37.9*  PLT 196 180 151 145*  MCV 94.4 94.4 94.4 94.0  MCH 31.4 31.8 31.0 31.5  MCHC 33.3 33.7 32.8 33.5  RDW 14.1 14.4 14.4 14.7  LYMPHSABS 0.9  --   --   --   MONOABS 1.7*  --   --   --   EOSABS 0.0  --   --   --   BASOSABS 0.1  --   --   --     Chemistries   Recent Labs Lab 05/31/16 0538  05/31/16 1444 06/01/16 0342 06/02/16 0331 06/03/16 0256 06/05/16 0456  NA 141  --   --  139 139 135 138  K 3.2*  --   --  4.2 4.5 3.6 3.5  CL 100*  --   --  105 107 104 101  CO2 25  --   --  25 23 23 26   GLUCOSE 113*  --   --  114* 98 128* 120*  BUN 37*  --   --  39* 23* 22* 24*  CREATININE 2.15*  --   --  1.89* 1.34* 1.24 1.21  CALCIUM 9.2  --   --  8.6* 8.4* 8.2* 8.6*  AST  --   < > 324* 311* 285* 259* 128*  ALT  --    < > 268* 280* 274* 245* 159*  ALKPHOS  --   < > 106 94 98 94 97  BILITOT  --   < > 0.8 1.2 1.0 0.8 1.2  < > = values in this interval not displayed. ------------------------------------------------------------------------------------------------------------------ estimated creatinine clearance is 56.2 mL/min (by C-G formula based on SCr of 1.21 mg/dL). ------------------------------------------------------------------------------------------------------------------ No results for input(s): HGBA1C in the last 72 hours. ------------------------------------------------------------------------------------------------------------------  No results for input(s): CHOL, HDL, LDLCALC, TRIG, CHOLHDL, LDLDIRECT in the last 72 hours. ------------------------------------------------------------------------------------------------------------------ No results for input(s): TSH, T4TOTAL, T3FREE, THYROIDAB in the last 72 hours.  Invalid input(s): FREET3 ------------------------------------------------------------------------------------------------------------------ No results for input(s): VITAMINB12, FOLATE, FERRITIN, TIBC, IRON, RETICCTPCT in the last 72 hours.  Coagulation profile No results for input(s): INR, PROTIME in the last 168 hours.  No results for input(s): DDIMER in the last 72 hours.  Cardiac Enzymes  Recent Labs Lab 05/31/16 0538 05/31/16 0836 05/31/16 1756  TROPONINI 0.10* 0.10* 0.12*   ------------------------------------------------------------------------------------------------------------------ Invalid input(s): POCBNP   CBG: No results for input(s): GLUCAP in the last 168 hours.     Studies: Dg Chest Port 1 View  Result Date: 06/03/2016 CLINICAL DATA:  SOB/cough x3-4 weeks. Pt said he gets up very little phlegm when coughing. Hx of acid reflux, hiatal hernia, hypertension, sinus bradycardia. No surgery to chest. Nonsmoker. EXAM: PORTABLE CHEST 1 VIEW COMPARISON:   05/31/2016 FINDINGS: Cardiac silhouette is borderline enlarged. No mediastinal or hilar masses. No convincing adenopathy. There is left greater than right lung base opacity. On the left this could reflect pneumonia or atelectasis. This has the appearance of atelectasis on the right. Remainder of the lungs is clear. No convincing pleural effusion.  No pneumothorax. IMPRESSION: 1. Possible pneumonia versus atelectasis at the left lung base. 2. Mild right lung base atelectasis. 3. Findings are similar to the prior study. Electronically Signed   By: Lajean Manes M.D.   On: 06/03/2016 10:49      Lab Results  Component Value Date   HGBA1C 5.7 (H) 05/31/2016   HGBA1C 5.9 (H) 12/19/2015   Lab Results  Component Value Date   LDLCALC 93 12/20/2015   CREATININE 1.21 06/05/2016       Scheduled Meds: . apixaban  5 mg Oral BID  . ceFEPime (MAXIPIME) IV  2 g Intravenous Q12H  . feeding supplement (ENSURE ENLIVE)  237 mL Oral Daily  . furosemide  40 mg Oral Daily  . hydrALAZINE  25 mg Oral Q8H  . sodium chloride flush  3 mL Intravenous Q12H  . tamsulosin  0.4 mg Oral Daily  . vancomycin  750 mg Intravenous Q12H   Continuous Infusions:    LOS: 5 days    Time spent: >30 MINS    [facility]  [facility] Pager [phone]. If 7PM-7AM, please contact night-coverage at [email or web address], password [facility] 06/05/2016, 8:34 AM  LOS: 5 days

## 2016-06-06 LAB — CBC
HEMATOCRIT: 39 % (ref 39.0–52.0)
Hemoglobin: 13.1 g/dL (ref 13.0–17.0)
MCH: 31 pg (ref 26.0–34.0)
MCHC: 33.6 g/dL (ref 30.0–36.0)
MCV: 92.2 fL (ref 78.0–100.0)
PLATELETS: 189 10*3/uL (ref 150–400)
RBC: 4.23 MIL/uL (ref 4.22–5.81)
RDW: 14.2 % (ref 11.5–15.5)
WBC: 5.8 10*3/uL (ref 4.0–10.5)

## 2016-06-06 LAB — COMPREHENSIVE METABOLIC PANEL
ALT: 133 U/L — AB (ref 17–63)
ANION GAP: 7 (ref 5–15)
AST: 97 U/L — ABNORMAL HIGH (ref 15–41)
Albumin: 2.7 g/dL — ABNORMAL LOW (ref 3.5–5.0)
Alkaline Phosphatase: 97 U/L (ref 38–126)
BUN: 21 mg/dL — ABNORMAL HIGH (ref 6–20)
CHLORIDE: 102 mmol/L (ref 101–111)
CO2: 26 mmol/L (ref 22–32)
CREATININE: 1.11 mg/dL (ref 0.61–1.24)
Calcium: 8.6 mg/dL — ABNORMAL LOW (ref 8.9–10.3)
Glucose, Bld: 120 mg/dL — ABNORMAL HIGH (ref 65–99)
POTASSIUM: 3.4 mmol/L — AB (ref 3.5–5.1)
SODIUM: 135 mmol/L (ref 135–145)
Total Bilirubin: 1.4 mg/dL — ABNORMAL HIGH (ref 0.3–1.2)
Total Protein: 5.6 g/dL — ABNORMAL LOW (ref 6.5–8.1)

## 2016-06-06 MED ORDER — FUROSEMIDE 40 MG PO TABS: 40.0000 mg | ORAL_TABLET | Freq: Every day | ORAL | 1 refills | Status: DC

## 2016-06-06 MED ORDER — POTASSIUM CHLORIDE CRYS ER 20 MEQ PO TBCR: 40.0000 meq | EXTENDED_RELEASE_TABLET | Freq: Once | ORAL | 0 refills | Status: DC

## 2016-06-06 MED ORDER — METOPROLOL TARTRATE 25 MG PO TABS: 12.5000 mg | ORAL_TABLET | Freq: Two times a day (BID) | ORAL | 3 refills | Status: DC

## 2016-06-06 MED ORDER — POTASSIUM CHLORIDE CRYS ER 20 MEQ PO TBCR
60.0000 meq | EXTENDED_RELEASE_TABLET | Freq: Once | ORAL | Status: AC
  Administered 2016-06-06: 60 meq via ORAL
  Filled 2016-06-06: qty 3

## 2016-06-06 MED ORDER — HYDRALAZINE HCL 10 MG PO TABS: 10.0000 mg | ORAL_TABLET | Freq: Three times a day (TID) | ORAL | 1 refills | Status: DC

## 2016-06-06 MED ORDER — CEFPODOXIME PROXETIL 200 MG PO TABS: 200.0000 mg | ORAL_TABLET | Freq: Two times a day (BID) | ORAL | 0 refills | Status: DC

## 2016-06-06 MED ORDER — GUAIFENESIN-DM 100-10 MG/5ML PO SYRP: 5.0000 mL | ORAL_SOLUTION | ORAL | 0 refills | Status: DC | PRN

## 2016-06-06 NOTE — Discharge Summary (Addendum)
Physician Discharge Summary  Kelli Egolf MRN: [id number] DOB/AGE: 12-Mar-1940 76 y.o.  PCP: Ivar Drape, PA   Admit date: 05/30/2016 Discharge date: 06/06/2016  Discharge Diagnoses:    Principal Problem:   UTI (urinary tract infection) Active Problems:   Atrial fibrillation (HCC)   Hypertension   Elevated troponin   Chronic systolic CHF (congestive heart failure) (HCC)   Acute urinary retention   Acute kidney injury superimposed on chronic kidney disease (HCC)   Generalized weakness   Falls    Follow-up recommendations Follow-up with PCP in 3-5 days , including all  additional recommended appointments as below Follow-up CBC, CMP in 3-5 days Patient discharged with indwelling foley due to urinary retention, will need to follow up with urology Repeat Thyroid function tests in 6 weeks    Adamantly refused to go to SNF  Current Discharge Medication List    START taking these medications   Details  cefpodoxime (VANTIN) 200 MG tablet Take 1 tablet (200 mg total) by mouth 2 (two) times daily. Qty: 10 tablet, Refills: 0    guaiFENesin-dextromethorphan (ROBITUSSIN DM) 100-10 MG/5ML syrup Take 5 mLs by mouth every 4 (four) hours as needed for cough. Qty: 118 mL, Refills: 0    hydrALAZINE (APRESOLINE) 10 MG tablet Take 1 tablet (10 mg total) by mouth every 8 (eight) hours. Qty: 90 tablet, Refills: 1    potassium chloride SA (K-DUR,KLOR-CON) 20 MEQ tablet Take 2 tablets (40 mEq total) by mouth once. Qty: 2 tablet, Refills: 0      CONTINUE these medications which have CHANGED   Details  furosemide (LASIX) 40 MG tablet Take 1 tablet (40 mg total) by mouth daily. Qty: 30 tablet, Refills: 1            CONTINUE these medications which have NOT CHANGED   Details  apixaban (ELIQUIS) 5 MG TABS tablet Take 1 tablet (5 mg total) by mouth 2 (two) times daily. Qty: 180 tablet, Refills: 3    ENSURE PLUS (ENSURE PLUS) LIQD Take 237 mLs by mouth daily.    nitroGLYCERIN  (NITROSTAT) 0.4 MG SL tablet Place 1 tablet (0.4 mg total) under the tongue every 5 (five) minutes as needed for chest pain. Qty: 25 tablet, Refills: 1    tamsulosin (FLOMAX) 0.4 MG CAPS capsule Take 0.4 mg by mouth daily.      STOP taking these medications     amiodarone (PACERONE) 200 MG tablet      atorvastatin (LIPITOR) 20 MG tablet      lisinopril (PRINIVIL,ZESTRIL) 5 MG tablet          Discharge Condition: *stable   Discharge Instructions Get Medicines reviewed and adjusted: Please take all your medications with you for your next visit with your Primary MD  Please request your Primary MD to go over all hospital tests and procedure/radiological results at the follow up, please ask your Primary MD to get all Hospital records sent to his/her office.  If you experience worsening of your admission symptoms, develop shortness of breath, life threatening emergency, suicidal or homicidal thoughts you must seek medical attention immediately by calling 911 or calling your MD immediately if symptoms less severe.  You must read complete instructions/literature along with all the possible adverse reactions/side effects for all the Medicines you take and that have been prescribed to you. Take any new Medicines after you have completely understood and accpet all the possible adverse reactions/side effects.   Do not drive when taking Pain medications.   Do not  take more than prescribed Pain, Sleep and Anxiety Medications  Special Instructions: If you have smoked or chewed Tobacco in the last 2 yrs please stop smoking, stop any regular Alcohol and or any Recreational drug use.  Wear Seat belts while driving.  Please note  You were cared for by a hospitalist during your hospital stay. Once you are discharged, your primary care physician will handle any further medical issues. Please note that NO REFILLS for any discharge medications will be authorized once you are discharged, as it is  imperative that you return to your primary care physician (or establish a relationship with a primary care physician if you do not have one) for your aftercare needs so that they can reassess your need for medications and monitor your lab values.     No Known Allergies    Disposition: 01-Home or Self Care   Consults:  cardiology   Significant Diagnostic Studies:  Ct Abdomen Pelvis Wo Contrast  Result Date: 05/31/2016 CLINICAL DATA:  Hydronephrosis.  Generalized weakness. EXAM: CT ABDOMEN AND PELVIS WITHOUT CONTRAST TECHNIQUE: Multidetector CT imaging of the abdomen and pelvis was performed following the standard protocol without IV contrast. COMPARISON:  Ultrasound same day FINDINGS: Lower chest: Linear atelectasis in both lower lobes. Moderate pericardial effusion. Cardiomegaly. No pleural effusion. Small hiatal hernia. Hepatobiliary: Liver parenchyma is normal. Small calcified stones or hyperdense sludge dependent within the gallbladder. There are multiple stones in the distal common bile duct without ductal dilatation. Pancreas: Normal.  Multiple stones in the distal common bile duct. Spleen: Normal Adrenals/Urinary Tract: Adrenal glands are normal. Left kidney is normal without contrast. Tiny exit fix cyst lower pole. Right kidney shows no significant parenchymal finding. There is fullness of the renal collecting system an extrarenal pelvis. The proximal ureter is tortuous in the ureter is then dilated all the way to the bladder. No evidence of stone. The prostate gland is markedly enlarged. There are a few small bladder diverticulae. The bladder appears thick walled. Stomach/Bowel: No acute or significant bowel finding. Scattered colonic diverticulae without diverticulitis. Vascular/Lymphatic: Aortic atherosclerosis. No aneurysm. IVC is normal. No retroperitoneal mass or lymphadenopathy. Reproductive: None other significant. Other: Small inguinal hernia on the right containing fat.  Musculoskeletal: Chronic lumbar degenerative changes. IMPRESSION: Moderate pericardial effusion. Cardiomegaly. Linear atelectasis or scarring at the lung bases. Gallstones/hyperdense sludge dependent in the gallbladder. Numerous small stones in the distal common bile duct. No evidence of ductal dilatation. Marked enlargement of the prostate. 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 Chronic appearing compensated hydroureteronephrosis on the right, presumably secondary to the chronic bladder disease. No evidence of urinary tract stone disease. Aortic atherosclerosis. Electronically Signed   By: Nelson Chimes M.D.   On: 05/31/2016 16:34   US Renal  Result Date: 05/31/2016 CLINICAL DATA:  Acute kidney injury. EXAM: RENAL / URINARY TRACT ULTRASOUND COMPLETE COMPARISON:  None. FINDINGS: Right Kidney: Length: 12.7 cm. Echogenicity within normal limits. No mass visualized. Moderate hydronephrosis is noted. Left Kidney: Length: 12.5 cm. Echogenicity within normal limits. No mass visualized. Mild hydronephrosis is noted. Bladder: Mobile debris or sludge is noted within the dependent portion of the urinary bladder. Left ureteral jet is noted. Right she at is not clearly identified. Mildly distended urinary bladder is noted. IMPRESSION: Moderate right hydronephrosis. Mild left hydronephrosis. Mildly distended urinary bladder is noted with left ureteral jet visualized. Mobile debris or sludge is noted in dependent portion of urinary bladder lumen. Electronically Signed   By: Jeneen Rinks  Murlean Caller, M.D.   On: 05/31/2016 08:50   Dg Chest Port 1 View  Result Date: 06/03/2016 CLINICAL DATA:  SOB/cough x3-4 weeks. Pt said he gets up very little phlegm when coughing. Hx of acid reflux, hiatal hernia, hypertension, sinus bradycardia. No surgery to chest. Nonsmoker. EXAM: PORTABLE CHEST 1 VIEW COMPARISON:  05/31/2016 FINDINGS: Cardiac silhouette is borderline enlarged. No  mediastinal or hilar masses. No convincing adenopathy. There is left greater than right lung base opacity. On the left this could reflect pneumonia or atelectasis. This has the appearance of atelectasis on the right. Remainder of the lungs is clear. No convincing pleural effusion.  No pneumothorax. IMPRESSION: 1. Possible pneumonia versus atelectasis at the left lung base. 2. Mild right lung base atelectasis. 3. Findings are similar to the prior study. Electronically Signed   By: Lajean Manes M.D.   On: 06/03/2016 10:49   Dg Chest Portable 1 View  Result Date: 05/31/2016 CLINICAL DATA:  Initial evaluation for acute weakness. EXAM: PORTABLE CHEST 1 VIEW COMPARISON:  Prior radiograph from 12/19/2015. FINDINGS: Moderate cardiomegaly, stable from prior. Mediastinal silhouette within normal limits. Aortic atherosclerosis noted. Lungs hypoinflated. Secondary bibasilar and perihilar vascular congestion. Linear scarring noted within the right perihilar region, similar to previous. No pulmonary edema or pleural effusion. No definite focal infiltrates. No pneumothorax. No acute osseus abnormality. IMPRESSION: 1. Shallow lung inflation with secondary bibasilar/perihilar bronchovascular crowding. No other active cardiopulmonary disease. 2. Stable cardiomegaly. 3. Aortic atherosclerosis. Electronically Signed   By: Jeannine Boga M.D.   On: 05/31/2016 00:44    echocardiogram  LV EF: 35% -   40%  -------------------------------------------------------------------  ------------------------------------------------------------------- History:   PMH:   Atrial fibrillation.  Risk factors: Hypertension.  ------------------------------------------------------------------- Study Conclusions  - Left ventricle: The cavity size was normal. There was severe   concentric hypertrophy. Systolic function was moderately reduced.   The estimated ejection fraction was in the range of 35% to 40%.   Diffuse  hypokinesis. Features are consistent with a pseudonormal   left ventricular filling pattern, with concomitant abnormal   relaxation and increased filling pressure (grade 2 diastolic   dysfunction). Doppler parameters are consistent with elevated   ventricular end-diastolic filling pressure. - Mitral valve: There was mild regurgitation. - Left atrium: The atrium was moderately dilated. - Right atrium: The atrium was moderately dilated. - Tricuspid valve: There was mild regurgitation. - Pulmonic valve: There was mild regurgitation. - Pulmonary arteries: Systolic pressure was moderately increased.   PA peak pressure: 43 mm Hg (S). - Inferior vena cava: The vessel was normal in size. - Pericardium, extracardiac: There was no pericardial effusion.  Impressions:  - When compared to the prior study on 12/20/2015 there is no   significant difference.      Filed Weights   06/04/16 0554 06/05/16 0323 06/06/16 0515  Weight: 77.6 kg (171 lb 1.2 oz) 75.3 kg (166 lb 1.6 oz) 75.6 kg (166 lb 9.6 oz)     Microbiology: Recent Results (from the past 240 hour(s))  Culture, blood (Routine X 2) w Reflex to ID Panel     Status: None   Collection Time: 05/31/16  8:49 AM  Result Value Ref Range Status   Specimen Description BLOOD RIGHT WRIST  Final   Special Requests BOTTLES DRAWN AEROBIC AND ANAEROBIC  5CC  Final   Culture NO GROWTH 5 DAYS  Final   Report Status 06/05/2016 FINAL  Final  Culture, blood (Routine X 2) w Reflex to ID Panel  Status: None   Collection Time: 05/31/16  8:54 AM  Result Value Ref Range Status   Specimen Description BLOOD RIGHT HAND  Final   Special Requests BOTTLES DRAWN AEROBIC AND ANAEROBIC  5CC  Final   Culture NO GROWTH 5 DAYS  Final   Report Status 06/05/2016 FINAL  Final       Blood Culture    Component Value Date/Time   SDES BLOOD RIGHT HAND 05/31/2016 0854   SPECREQUEST BOTTLES DRAWN AEROBIC AND ANAEROBIC  5CC 05/31/2016 0854   CULT NO GROWTH 5 DAYS  05/31/2016 0854   REPTSTATUS 06/05/2016 FINAL 05/31/2016 0854      Labs: Results for orders placed or performed during the hospital encounter of 05/30/16 (from the past 48 hour(s))  Comprehensive metabolic panel     Status: Abnormal   Collection Time: 06/05/16  4:56 AM  Result Value Ref Range   Sodium 138 135 - 145 mmol/L   Potassium 3.5 3.5 - 5.1 mmol/L   Chloride 101 101 - 111 mmol/L   CO2 26 22 - 32 mmol/L   Glucose, Bld 120 (H) 65 - 99 mg/dL   BUN 24 (H) 6 - 20 mg/dL   Creatinine, Ser 1.21 0.61 - 1.24 mg/dL   Calcium 8.6 (L) 8.9 - 10.3 mg/dL   Total Protein 5.4 (L) 6.5 - 8.1 g/dL   Albumin 2.7 (L) 3.5 - 5.0 g/dL   AST 128 (H) 15 - 41 U/L   ALT 159 (H) 17 - 63 U/L   Alkaline Phosphatase 97 38 - 126 U/L   Total Bilirubin 1.2 0.3 - 1.2 mg/dL   GFR calc non Af Amer 57 (L) >60 mL/min   GFR calc Af Amer >60 >60 mL/min    Comment: (NOTE) The eGFR has been calculated using the CKD EPI equation. This calculation has not been validated in all clinical situations. eGFR's persistently <60 mL/min signify possible Chronic Kidney Disease.    Anion gap 11 5 - 15  Comprehensive metabolic panel     Status: Abnormal   Collection Time: 06/06/16  4:57 AM  Result Value Ref Range   Sodium 135 135 - 145 mmol/L   Potassium 3.4 (L) 3.5 - 5.1 mmol/L   Chloride 102 101 - 111 mmol/L   CO2 26 22 - 32 mmol/L   Glucose, Bld 120 (H) 65 - 99 mg/dL   BUN 21 (H) 6 - 20 mg/dL   Creatinine, Ser 1.11 0.61 - 1.24 mg/dL   Calcium 8.6 (L) 8.9 - 10.3 mg/dL   Total Protein 5.6 (L) 6.5 - 8.1 g/dL   Albumin 2.7 (L) 3.5 - 5.0 g/dL   AST 97 (H) 15 - 41 U/L   ALT 133 (H) 17 - 63 U/L   Alkaline Phosphatase 97 38 - 126 U/L   Total Bilirubin 1.4 (H) 0.3 - 1.2 mg/dL   GFR calc non Af Amer >60 >60 mL/min   GFR calc Af Amer >60 >60 mL/min    Comment: (NOTE) The eGFR has been calculated using the CKD EPI equation. This calculation has not been validated in all clinical situations. eGFR's persistently <60 mL/min  signify possible Chronic Kidney Disease.    Anion gap 7 5 - 15  CBC     Status: None   Collection Time: 06/06/16  4:57 AM  Result Value Ref Range   WBC 5.8 4.0 - 10.5 K/uL   RBC 4.23 4.22 - 5.81 MIL/uL   Hemoglobin 13.1 13.0 - 17.0 g/dL   HCT 39.0  39.0 - 52.0 %   MCV 92.2 78.0 - 100.0 fL   MCH 31.0 26.0 - 34.0 pg   MCHC 33.6 30.0 - 36.0 g/dL   RDW 14.2 11.5 - 15.5 %   Platelets 189 150 - 400 K/uL     Lipid Panel     Component Value Date/Time   CHOL 130 12/20/2015 0325   TRIG 51 12/20/2015 0325   HDL 27 (L) 12/20/2015 0325   CHOLHDL 4.8 12/20/2015 0325   VLDL 10 12/20/2015 0325   LDLCALC 93 12/20/2015 0325     Lab Results  Component Value Date   HGBA1C 5.7 (H) 05/31/2016   HGBA1C 5.9 (H) 12/19/2015       HPI :   76 y.o.malewith medical history significant of HTN, HLD, A. Fib with LBBB, systolic CHF with last EF 40%,dilated cardiomyopathy, pre-diabetes; who presents with complaints of at least 3 falls days.Patient was found to have a urinary tract infection, acute kidney injury with creatinine 2.14(baseline creatinine previously 1.1), and elevated troponin of 0.13. Bladder scan in the ED showed retention of 805 mL of urine. Patient admitted for UTI, possible sepsis, possible pyelonephritis, HCAP  HOSPITAL COURSE: *  Urinary tract infection with hematuria/urinary retention /acute kidney injury Initially started on rocephin , Blood culture no growth so far,  Chest x-ray 4/1 suspicious for HCAP, antibiotics escalated to vancomycin and cefepime on 4/1,  Antibiotics changed over to cefpodoxime at discharge for another 5 days  Continue anticoagulation [patient on eliquis ) as  hematuria has resolved Renal ultrasound showed  right-sided hydronephrosis CT abdomen pelvis  , shows enlargement of the prostrate compensated hydroureteronephrosis Consulted Dr Matilde Sprang for foley placement and hydronephrosis, he advised [place] to place  Foley based on outpatient cystoscopy  results. Foley placed without any issues. Patient discharged with indwelling foley Patient would need to follow-up with urology in the outpatient setting   HCAP Started vanc and cefepime 4/1.  Now changed to cefopodoxime      Acute kidney injury on chronic kidney disease stage III secondary to obstructive uropathy:Baseline creatinine previously 1.07-1.15, but patient presented with elevated creatinine of 2.14 and a BUN of 35.  Creatinine improved  after Foley placement, creatinine down to 1.34>1.24>1.21 Follow BMP   Urinary retention 2/2 BPH:  Patient noted to have over 800 mL of urine present post void on bladder scan on admission.Suspect secondary to BPH. Continue Flomax Continue Foley catheter at discharge and follow-up with urology   Transaminitis secondary to hepatic congestion, due to CHF, improving Gallstones/hyperdense sludge dependent in the gallbladder. Numerous small stones in the distal common bile duct. No evidence of ductal dilatation. Acute Hepatitis panel negative Lipitor on hold, CK within normal limits Amiodarone held because of worsening LFTs Repeat liver function in outpatient setting  Generalized weakness with falls, multifactorial likely secondary to the above Physical therapy /OT-SNF Patient declines SNF    Elevated troponinsin the setting of known cardiomyopathy/acute kidney injury:Patient has history of elevated troponin suspect symptoms could likely exacerbated with kidney failure. ,it appears that the patient was previously set up multiple times for left heart cath,but they were subsequent canceled. troponins improving, consider outpatient myovue as per cardiology Troponin levels were elevated, but in the absence of any active anginal symptoms, this is probably likely just mild troponin leak from systemic illness. This goes along with the elevated LFTs. Held  metoprolol in the setting of bradycardia,     Atrial fibrillation:CHADSVASC is  at least 3. Continue Eliquis, metoprolol and amiodarone on  hold due to bradycardia  H/O systolic CHF with dilated cardiomyopathy/moderate pericardial effusion on CT  Repeat 2-D echo , EF 35-40%, moderate pulmonary hypertension, echo negative for pericardial effusion hold metoprolol given bradycardia seems relatively euvolemic (chest x-ray showed no active pulmonary edema) ACE inhibitor being held for renal insufficiency and borderline blood pressures in the setting of ongoing infection Lasix increased from 20 to 40 mg a day, at discharge  Cardiology to review meds and readjust, needs cardiology follow up   Essential hypertension Continue to hold lisinopril secondary to acute kidney injury Reduced dose of hydralazine due to borderline low blood pressure   Prediabetes Monitor CBGs  Hemoglobin A1c 5.7  Low TSH- free T4 1.6 Repeat thyroid fn tests in 4-6 weeks     Discharge Exam:  Blood pressure 136/75, pulse 60, temperature 97.3 F (36.3 C), temperature source Oral, resp. rate 16, height _0  (1.88 m), weight 75.6 kg (166 lb 9.6 oz), SpO2 93 %. Cardiovascular system: S1 & S2 heard, RRR. No JVD, murmurs, rubs, gallops or clicks. No pedal edema. Gastrointestinal system: Abdomen is nondistended, soft and nontender. No organomegaly or masses felt. Normal bowel sounds heard. Central nervous system: Alert and oriented. No focal neurological deficits. Extremities: Symmetric 5 x 5 power. Skin: No rashes, lesions or ulcers Psychiatry: Judgement and insight appear normal. Mood & affect appropriate.      Follow-up Information    [facility] Follow up.   Why:  Home Health RN, Physical Therapy, Occupational Therapy, aide and Social Worker Contact information: [address] [place] [address] [phone]        [facility] Follow up.   Contact information: [address] [place] [address] [phone]         Ivar Drape, PA Follow up in 1 month(s).   Specialties:  Physician Assistant, Urgent Care Why:  appointment scheduled for 06/08/2016 at 9:45 am Contact information: [place] [address] [phone]           Signed: Reyne Dumas 06/06/2016, 8:57 AM        Time spent >45 mins

## 2016-06-06 NOTE — Progress Notes (Signed)
[facility] Liaison aware of dc home today. Confirmed with [facility] DME rep that oxygen was delivered to home along with RW. [facility] transport arranged for 2pm. Discussed with pt the importance of follow at his appt on Friday with PCP.  Jonnie Finner RN CCM Case Mgmt phone [phone]

## 2016-06-06 NOTE — Plan of Care (Signed)
Problem: Skin Integrity: Goal: Risk for impaired skin integrity will decrease Outcome: Progressing Pt refused assistance with turning every 2 hours. Educated pt on the importance of changing positions in bed in order to prevent skin break down. Pt stated that he can change positions himself and he doesn't require assistance. Fall risk bundle in place. Call light in reach, bed alarm on and hourly rounding performed. Will continue to monitor.

## 2016-06-06 NOTE — Evaluation (Signed)
Clinical/Bedside Swallow Evaluation Patient Details  Name: Stephen Copeland MRN: [id number] Date of Birth: [birth year]  Today's Date: 06/06/2016 Time: SLP Start Time (ACUTE ONLY): 0900 SLP Stop Time (ACUTE ONLY): 0920 SLP Time Calculation (min) (ACUTE ONLY): 20 min  Past Medical History:  Past Medical History:  Diagnosis Date  . Acid reflux   . Folliculitis   . Hiatal hernia   . Hyperlipidemia    a. pt is adamantly against statins.  . Hypertension   . Ischemia    a. Pt states he was diagnosed with "ischemia" in the 1990s but does not know further details, denies hx of heart blockage.  . Nummular dermatitis   . Scoliosis   . Sinus bradycardia   . Stomach ulcer    a. remote ulcer in the 1990s, no hx of bleeding.   Past Surgical History: History reviewed. No pertinent surgical history. HPI:  76 y.o.malewith medical history significant of HTN, HLD, A. Fib with LBBB, systolic CHF with last EF 40%,dilated cardiomyopathy, pre-diabetes; who presents with complaints of at least 3 falls days.Patient was found to have a urinary tract infection, acute kidney injury with creatinine 2.14(baseline creatinine previously 1.1), and elevated troponin of 0.13. Bladder scan in the ED showed retention of 805 mL of urine. Patient admitted for UTI, possible sepsis, possible pyelonephritis, HCAP. Pending decision about SNF. CXR one view shows There is left greater than right lung base opacity. On the left this could reflect pneumonia or atelectasis. This has the appearance of atelectasis on the right. Remainder of the lungs is clear.   Assessment / Plan / Recommendation Clinical Impression  Pt demonstrates adequate swallow function. Refused solids due to dislike but pt denies any difficulty swallowing solids and is cognitively intact. No SLP f/u needed, pt may continue regular diet and thin liquids.       Aspiration Risk       Diet Recommendation Regular;Thin liquid        Other  Recommendations Oral  Care Recommendations: Patient independent with oral care   Follow up Recommendations        Frequency and Duration            Prognosis        Swallow Study   General HPI: 76 y.o.malewith medical history significant of HTN, HLD, A. Fib with LBBB, systolic CHF with last EF 40%,dilated cardiomyopathy, pre-diabetes; who presents with complaints of at least 3 falls days.Patient was found to have a urinary tract infection, acute kidney injury with creatinine 2.14(baseline creatinine previously 1.1), and elevated troponin of 0.13. Bladder scan in the ED showed retention of 805 mL of urine. Patient admitted for UTI, possible sepsis, possible pyelonephritis, HCAP. Pending decision about SNF. CXR one view shows There is left greater than right lung base opacity. On the left this could reflect pneumonia or atelectasis. This has the appearance of atelectasis on the right. Remainder of the lungs is clear. Type of Study: Bedside Swallow Evaluation Previous Swallow Assessment: none Diet Prior to this Study: Regular;Thin liquids Temperature Spikes Noted: No Respiratory Status: Nasal cannula History of Recent Intubation: No Behavior/Cognition: Alert;Cooperative;Pleasant mood Oral Cavity Assessment: Within Functional Limits Oral Care Completed by SLP: No Oral Cavity - Dentition: Adequate natural dentition Vision: Functional for self-feeding Self-Feeding Abilities: Able to feed self Patient Positioning: Upright in bed Baseline Vocal Quality: Normal Volitional Cough: Strong Volitional Swallow: Able to elicit    Oral/Motor/Sensory Function Overall Oral Motor/Sensory Function: Within functional limits   Ice Chips  Thin Liquid Thin Liquid: Within functional limits    Nectar Thick Nectar Thick Liquid: Not tested   Honey Thick Honey Thick Liquid: Not tested   Puree Puree: Not tested   Solid   GO   Solid: Not tested       Stephen Baltimore, MA CCC-SLP [phone]  Stephen Copeland, Stephen Copeland 06/06/2016,1:25 PM

## 2016-06-07 DIAGNOSIS — R296 Repeated falls: Secondary | ICD-10-CM | POA: Diagnosis not present

## 2016-06-07 DIAGNOSIS — J189 Pneumonia, unspecified organism: Secondary | ICD-10-CM | POA: Diagnosis not present

## 2016-06-07 DIAGNOSIS — I130 Hypertensive heart and chronic kidney disease with heart failure and stage 1 through stage 4 chronic kidney disease, or unspecified chronic kidney disease: Secondary | ICD-10-CM | POA: Diagnosis not present

## 2016-06-07 DIAGNOSIS — I4891 Unspecified atrial fibrillation: Secondary | ICD-10-CM | POA: Diagnosis not present

## 2016-06-07 DIAGNOSIS — E785 Hyperlipidemia, unspecified: Secondary | ICD-10-CM | POA: Diagnosis not present

## 2016-06-07 DIAGNOSIS — N390 Urinary tract infection, site not specified: Secondary | ICD-10-CM | POA: Diagnosis not present

## 2016-06-07 DIAGNOSIS — Z7901 Long term (current) use of anticoagulants: Secondary | ICD-10-CM | POA: Diagnosis not present

## 2016-06-07 DIAGNOSIS — R7303 Prediabetes: Secondary | ICD-10-CM | POA: Diagnosis not present

## 2016-06-07 DIAGNOSIS — I420 Dilated cardiomyopathy: Secondary | ICD-10-CM | POA: Diagnosis not present

## 2016-06-07 DIAGNOSIS — I5022 Chronic systolic (congestive) heart failure: Secondary | ICD-10-CM | POA: Diagnosis not present

## 2016-06-07 DIAGNOSIS — N183 Chronic kidney disease, stage 3 (moderate): Secondary | ICD-10-CM | POA: Diagnosis not present

## 2016-06-08 ENCOUNTER — Encounter: Payer: Self-pay | Admitting: Emergency Medicine

## 2016-06-08 ENCOUNTER — Ambulatory Visit (INDEPENDENT_AMBULATORY_CARE_PROVIDER_SITE_OTHER): Payer: Medicare Other | Admitting: Physician Assistant

## 2016-06-08 VITALS — BP 127/83 | HR 62 | Temp 97.4°F | Resp 16 | Ht 74.0 in

## 2016-06-08 DIAGNOSIS — R319 Hematuria, unspecified: Secondary | ICD-10-CM | POA: Diagnosis not present

## 2016-06-08 DIAGNOSIS — Z09 Encounter for follow-up examination after completed treatment for conditions other than malignant neoplasm: Secondary | ICD-10-CM

## 2016-06-08 LAB — POCT URINALYSIS DIP (MANUAL ENTRY)
BILIRUBIN UA: NEGATIVE
GLUCOSE UA: NEGATIVE
Nitrite, UA: NEGATIVE
Protein Ur, POC: 100 — AB
SPEC GRAV UA: 1.025 (ref 1.030–1.035)
Urobilinogen, UA: 0.2 (ref ?–2.0)
pH, UA: 5.5 (ref 5.0–8.0)

## 2016-06-08 LAB — POC MICROSCOPIC URINALYSIS (UMFC): MUCUS RE: ABSENT

## 2016-06-08 MED ORDER — CEFPODOXIME PROXETIL 200 MG PO TABS: 200.0000 mg | ORAL_TABLET | Freq: Two times a day (BID) | ORAL | 0 refills | Status: AC

## 2016-06-08 NOTE — Progress Notes (Signed)
PRIMARY CARE AT [facility] [address] [place] [address] [phone]  Date:  06/08/2016   Name:  Stephen Copeland   DOB:  [birth year]   MRN:  [id number]  PCP:  No PCP Per Patient    History of Present Illness:  Stephen Copeland is a 76 y.o. male patient who presents to PCP with  Chief Complaint  Patient presents with  . Follow-up    Hospital follow-up, cystitis, hematuria     Patient is not a great historian at this time.  Wife can not address medication, or will not at this time...  Patient has hx of AKI, HTN, CHF who was hospitalized after 3 falls in a couple days, 9 days ago with associated generalized weakness associated symptoms including "generalized weakness, shortness of breath, lower abdominal pain and discomfort, fatigue, decreased appetite over the last 1 month, increased lethargy over the last 2 months, reports eating more ice suggestive of pica, and all foods/drinks tasting sweet. He denies having any significant chest pain, nausea, vomiting, diarrhea, Furthermore patient notes that he is being followed by urology and was noted to have ulceration somewhere in his urinary tract".  Admitted for possible sepsis secondary to pyelonepritis.  Found to have urinary retention.   No growth on urine culture.  He was placed with a foley catheter, and advised to follow up with urology.  possible pneumonia found on xray.  Creatinine 1.11 2 days ago, with elevation 2.14 7 days ago.  Discharged after 1 week with recommendation to SNF, however he adamantly declined.  Home health present at the home.  Given O2 for home which is new for him.    He reports that he is taking the furosemide, eliquis, flomax.  He is can not report that he is taking the antibiotic.  He could not confirm that he was taking the hydralazine that was recommended.  He is having dry cough that is non-productive. Patient does not know of the appointment with urology.     Patient Active Problem List   Diagnosis Date Noted  .  UTI (urinary tract infection) 05/31/2016  . Acute urinary retention 05/31/2016  . Acute kidney injury superimposed on chronic kidney disease ([place]) 05/31/2016  . Generalized weakness 05/31/2016  . Falls 05/31/2016  . Acute systolic CHF (congestive heart failure) ([place])   . Congestive dilated cardiomyopathy ([place])   . Atrial fibrillation with RVR ([place])   . Uncontrolled hypertension   . Pulmonary hypertension   . Chronic systolic CHF (congestive heart failure) ([place])   . Atrial fibrillation ([place]) 12/19/2015  . Hypertension 12/19/2015  . New onset a-fib ([place]) 12/19/2015  . Acute CHF ([place]) 12/19/2015  . Hyperglycemia 12/19/2015  . Acid reflux   . Hyperlipidemia   . Sinus bradycardia   . Gastroesophageal reflux disease   . Other hyperlipidemia   . Elevated troponin     Past Medical History:  Diagnosis Date  . Acid reflux   . Folliculitis   . Hiatal hernia   . Hyperlipidemia    a. pt is adamantly against statins.  . Hypertension   . Ischemia    a. Pt states he was diagnosed with "ischemia" in the 1990s but does not know further details, denies hx of heart blockage.  . Nummular dermatitis   . Scoliosis   . Sinus bradycardia   . Stomach ulcer    a. remote ulcer in the 1990s, no hx of bleeding.    No past surgical history on file.  Social History  Substance Use Topics  . Smoking status: Never Smoker  . Smokeless tobacco: Never Used  . Alcohol use No    Family History  Problem Relation Age of Onset  . Heart disease Mother     Further details not reported, died at age 39  . Valvular heart disease Father     H/o MV surgery, diet at age 50    No Known Allergies  Medication list has been reviewed and updated.  Current Outpatient Prescriptions on File Prior to Visit  Medication Sig Dispense Refill  . apixaban (ELIQUIS) 5 MG TABS tablet Take 1 tablet (5 mg total) by mouth 2 (two) times daily. 180 tablet 3  . ENSURE PLUS (ENSURE PLUS) LIQD Take 237 mLs by mouth daily.     . nitroGLYCERIN (NITROSTAT) 0.4 MG SL tablet Place 1 tablet (0.4 mg total) under the tongue every 5 (five) minutes as needed for chest pain. 25 tablet 1  . tamsulosin (FLOMAX) 0.4 MG CAPS capsule Take 0.4 mg by mouth daily.    . cefpodoxime (VANTIN) 200 MG tablet Take 1 tablet (200 mg total) by mouth 2 (two) times daily. 10 tablet 0  . hydrALAZINE (APRESOLINE) 10 MG tablet Take 1 tablet (10 mg total) by mouth every 8 (eight) hours. 90 tablet 1  . potassium chloride SA (K-DUR,KLOR-CON) 20 MEQ tablet Take 2 tablets (40 mEq total) by mouth once. 2 tablet 0   No current facility-administered medications on file prior to visit.     ROS ROS otherwise unremarkable unless listed above.  Physical Examination: BP 127/83   Pulse 62   Temp 97.4 F (36.3 C) (Oral)   Resp 16   Ht '6\' 2"'  (1.88 m)   SpO2 93% Comment: 93% Oxygen with Oxygen take utilization Ideal Body Weight: Weight in (lb) to have BMI = 25: 194.3  Physical Exam  Constitutional: He is oriented to person, place, and time. He appears well-developed and well-nourished. No distress.  HENT:  Head: Normocephalic and atraumatic.  Eyes: Conjunctivae and EOM are normal. Pupils are equal, round, and reactive to light.  Cardiovascular: Normal rate.   Pulmonary/Chest: Effort normal. No respiratory distress.  Abdominal: Soft. Normal appearance.  Neurological: He is alert and oriented to person, place, and time.  Skin: Skin is warm and dry. He is not diaphoretic.  Psychiatric: He has a normal mood and affect. His behavior is normal.    Wt Readings from Last 3 Encounters:  06/06/16 166 lb 9.6 oz (75.6 kg)  05/17/16 183 lb 3.2 oz (83.1 kg)  02/08/16 185 lb (83.9 kg)    Results for orders placed or performed in visit on 06/08/16  POCT urinalysis dipstick  Result Value Ref Range   Color, UA yellow yellow   Clarity, UA cloudy (A) clear   Glucose, UA negative negative   Bilirubin, UA small (A) negative   Ketones, POC UA negative negative    Spec Grav, UA 1.025 1.030 - 1.035   Blood, UA moderate (A) negative   pH, UA 5.5 5.0 - 8.0   Protein Ur, POC =100 (A) negative   Urobilinogen, UA 0.2 Negative - 2.0   Nitrite, UA Negative Negative   Leukocytes, UA Trace (A) Negative  POCT Microscopic Urinalysis ([facility])  Result Value Ref Range   WBC,UR,HPF,POC Moderate (A) None WBC/hpf   RBC,UR,HPF,POC Moderate (A) None RBC/hpf   Bacteria Many (A) None, Too numerous to count   Mucus Absent Absent   Epithelial Cells, UR Per Microscopy Moderate (A) None, Too  numerous to count cells/hpf   Ct Abdomen Pelvis Wo Contrast  Result Date: 05/31/2016 CLINICAL DATA:  Hydronephrosis.  Generalized weakness. EXAM: CT ABDOMEN AND PELVIS WITHOUT CONTRAST TECHNIQUE: Multidetector CT imaging of the abdomen and pelvis was performed following the standard protocol without IV contrast. COMPARISON:  Ultrasound same day FINDINGS: Lower chest: Linear atelectasis in both lower lobes. Moderate pericardial effusion. Cardiomegaly. No pleural effusion. Small hiatal hernia. Hepatobiliary: Liver parenchyma is normal. Small calcified stones or hyperdense sludge dependent within the gallbladder. There are multiple stones in the distal common bile duct without ductal dilatation. Pancreas: Normal.  Multiple stones in the distal common bile duct. Spleen: Normal Adrenals/Urinary Tract: Adrenal glands are normal. Left kidney is normal without contrast. Tiny exit fix cyst lower pole. Right kidney shows no significant parenchymal finding. There is fullness of the renal collecting system an extrarenal pelvis. The proximal ureter is tortuous in the ureter is then dilated all the way to the bladder. No evidence of stone. The prostate gland is markedly enlarged. There are a few small bladder diverticulae. The bladder appears thick walled. Stomach/Bowel: No acute or significant bowel finding. Scattered colonic diverticulae without diverticulitis. Vascular/Lymphatic: Aortic atherosclerosis. No  aneurysm. IVC is normal. No retroperitoneal mass or lymphadenopathy. Reproductive: None other significant. Other: Small inguinal hernia on the right containing fat. Musculoskeletal: Chronic lumbar degenerative changes. IMPRESSION: Moderate pericardial effusion. Cardiomegaly. Linear atelectasis or scarring at the lung bases. Gallstones/hyperdense sludge dependent in the gallbladder. Numerous small stones in the distal common bile duct. No evidence of ductal dilatation. Marked enlargement of the prostate. 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 Chronic appearing compensated hydroureteronephrosis on the right, presumably secondary to the chronic bladder disease. No evidence of urinary tract stone disease. Aortic atherosclerosis. Electronically Signed   By: Nelson Chimes M.D.   On: 05/31/2016 16:34   US Renal  Result Date: 05/31/2016 CLINICAL DATA:  Acute kidney injury. EXAM: RENAL / URINARY TRACT ULTRASOUND COMPLETE COMPARISON:  None. FINDINGS: Right Kidney: Length: 12.7 cm. Echogenicity within normal limits. No mass visualized. Moderate hydronephrosis is noted. Left Kidney: Length: 12.5 cm. Echogenicity within normal limits. No mass visualized. Mild hydronephrosis is noted. Bladder: Mobile debris or sludge is noted within the dependent portion of the urinary bladder. Left ureteral jet is noted. Right she at is not clearly identified. Mildly distended urinary bladder is noted. IMPRESSION: Moderate right hydronephrosis. Mild left hydronephrosis. Mildly distended urinary bladder is noted with left ureteral jet visualized. Mobile debris or sludge is noted in dependent portion of urinary bladder lumen. Electronically Signed   By: Marijo Conception, M.D.   On: 05/31/2016 08:50   Dg Chest Port 1 View  Result Date: 06/03/2016 CLINICAL DATA:  SOB/cough x3-4 weeks. Pt said he gets up very little phlegm when coughing. Hx of acid reflux, hiatal hernia, hypertension,  sinus bradycardia. No surgery to chest. Nonsmoker. EXAM: PORTABLE CHEST 1 VIEW COMPARISON:  05/31/2016 FINDINGS: Cardiac silhouette is borderline enlarged. No mediastinal or hilar masses. No convincing adenopathy. There is left greater than right lung base opacity. On the left this could reflect pneumonia or atelectasis. This has the appearance of atelectasis on the right. Remainder of the lungs is clear. No convincing pleural effusion.  No pneumothorax. IMPRESSION: 1. Possible pneumonia versus atelectasis at the left lung base. 2. Mild right lung base atelectasis. 3. Findings are similar to the prior study. Electronically Signed   By: Lajean Manes M.D.   On: 06/03/2016 10:49   Dg  Chest Portable 1 View  Result Date: 05/31/2016 CLINICAL DATA:  Initial evaluation for acute weakness. EXAM: PORTABLE CHEST 1 VIEW COMPARISON:  Prior radiograph from 12/19/2015. FINDINGS: Moderate cardiomegaly, stable from prior. Mediastinal silhouette within normal limits. Aortic atherosclerosis noted. Lungs hypoinflated. Secondary bibasilar and perihilar vascular congestion. Linear scarring noted within the right perihilar region, similar to previous. No pulmonary edema or pleural effusion. No definite focal infiltrates. No pneumothorax. No acute osseus abnormality. IMPRESSION: 1. Shallow lung inflation with secondary bibasilar/perihilar bronchovascular crowding. No other active cardiopulmonary disease. 2. Stable cardiomegaly. 3. Aortic atherosclerosis. Electronically Signed   By: Jeannine Boga M.D.   On: 05/31/2016 00:44    Assessment and Plan: Aaran Enberg is a 76 y.o. male who is here today for cc of hospitalization follow up. [facility]ed [facility] who will allow him to be seen 06/13/2016.   Follow up with Brigitte Pulse, MD on Monday.   Vantin was sent to express mailing script.  I am reordering for local pharmacy so we may start this abx today.  Patient and wife report that they will pick this up. Advised Candie Chroman to  contact [facility] for O2 to be changed and educate wife on how to fill tank at home.   Hematuria, undiagnosed cause - Plan: POCT urinalysis dipstick, POCT Microscopic Urinalysis ([facility]), CBC, CMP14+EGFR, cefpodoxime (VANTIN) 200 MG tablet  Ivar Drape, PA-C [facility] 4/8/20189:41 AM

## 2016-06-08 NOTE — Patient Instructions (Addendum)
You will follow up with 06/11/2016 with Dr. Brigitte Pulse 11:30am. You have an appointment with [facility] 06/13/16 at 10:30am at [facility], [place] Please make sure that you are going.      Please pick up the antibiotic from the pharmacy.       IF you received an x-ray today, you will receive an invoice from [facility]. Please contact [facility] at [phone] with questions or concerns regarding your invoice.   IF you received labwork today, you will receive an invoice from [facility]. Please contact [facility] at [phone] with questions or concerns regarding your invoice.   Our billing staff will not be able to assist you with questions regarding bills from these companies.  You will be contacted with the lab results as soon as they are available. The fastest way to get your results is to activate your My Chart account. Instructions are located on the last page of this paperwork. If you have not heard from [place] regarding the results in 2 weeks, please contact this office.

## 2016-06-09 DIAGNOSIS — J189 Pneumonia, unspecified organism: Secondary | ICD-10-CM | POA: Diagnosis not present

## 2016-06-09 DIAGNOSIS — I420 Dilated cardiomyopathy: Secondary | ICD-10-CM | POA: Diagnosis not present

## 2016-06-09 DIAGNOSIS — N183 Chronic kidney disease, stage 3 (moderate): Secondary | ICD-10-CM | POA: Diagnosis not present

## 2016-06-09 DIAGNOSIS — N390 Urinary tract infection, site not specified: Secondary | ICD-10-CM | POA: Diagnosis not present

## 2016-06-09 DIAGNOSIS — I5022 Chronic systolic (congestive) heart failure: Secondary | ICD-10-CM | POA: Diagnosis not present

## 2016-06-09 DIAGNOSIS — I130 Hypertensive heart and chronic kidney disease with heart failure and stage 1 through stage 4 chronic kidney disease, or unspecified chronic kidney disease: Secondary | ICD-10-CM | POA: Diagnosis not present

## 2016-06-09 LAB — CBC
HEMATOCRIT: 45.8 % (ref 37.5–51.0)
HEMOGLOBIN: 14.9 g/dL (ref 13.0–17.7)
MCH: 30.9 pg (ref 26.6–33.0)
MCHC: 32.5 g/dL (ref 31.5–35.7)
MCV: 95 fL (ref 79–97)
Platelets: 350 10*3/uL (ref 150–379)
RBC: 4.82 x10E6/uL (ref 4.14–5.80)
RDW: 14.4 % (ref 12.3–15.4)
WBC: 8.5 10*3/uL (ref 3.4–10.8)

## 2016-06-09 LAB — CMP14+EGFR
ALBUMIN: 3.8 g/dL (ref 3.5–4.8)
ALK PHOS: 133 IU/L — AB (ref 39–117)
ALT: 122 IU/L — ABNORMAL HIGH (ref 0–44)
AST: 70 IU/L — AB (ref 0–40)
Albumin/Globulin Ratio: 1.5 (ref 1.2–2.2)
BUN/Creatinine Ratio: 18 (ref 10–24)
BUN: 24 mg/dL (ref 8–27)
Bilirubin Total: 1.3 mg/dL — ABNORMAL HIGH (ref 0.0–1.2)
CO2: 26 mmol/L (ref 18–29)
CREATININE: 1.31 mg/dL — AB (ref 0.76–1.27)
Calcium: 9.5 mg/dL (ref 8.6–10.2)
Chloride: 99 mmol/L (ref 96–106)
GFR calc Af Amer: 61 mL/min/{1.73_m2} (ref >59)
GFR, EST NON AFRICAN AMERICAN: 53 mL/min/{1.73_m2} — AB (ref >59)
GLOBULIN, TOTAL: 2.6 g/dL (ref 1.5–4.5)
GLUCOSE: 109 mg/dL — AB (ref 65–99)
Potassium: 4.2 mmol/L (ref 3.5–5.2)
SODIUM: 141 mmol/L (ref 134–144)
TOTAL PROTEIN: 6.4 g/dL (ref 6.0–8.5)

## 2016-06-10 NOTE — Progress Notes (Deleted)
   Subjective:    Patient ID: Stephen Copeland, male    DOB: 09-Sep-1940, 76 y.o.   MRN: [id number]  HPI  Ms. Stephen Copeland is a 76 yo male here today for a 3d f/u after his initial hosp f/u visit for hospitalization 3/29-4/4.  He had had 1-2 mos of general decline and debility resulting in mult falls in the several days prior to admission with multi-system decline.  At his initial hosp f/u visit here with my colleague 3d prior both pt and his wife were unable to report whether he was taking his rx'd medications. He had been sent home on vantin 200mg  bid x 5d for a CAPna (though admitted for pyelo, UClx was neg) but the antibiotic had been mistakenly sent to the mail order pharmacy and pt did not realize that he needed to be on one so did have 1-2d w/o abx therapy.  Did restart on 4/6 after seeing Stephen Copeland.   [name] does not have a PCP and is going to establish here.  He has been following closely with his cardiologist Dr. Angelena Copeland prior.  During his hospitalization, he was noted to have urinary retention so was discharged with an indwelling Stephen Copeland cath and has appt with urology in 2d - 4/11.  On flomax.  HTN:  Started on hydralazine 10mg  tid during recent hospitalizaiton as his lisinopril 5 was stopped - pt not sure if he is taking??  HLD: lipitor was stopped during hosp  A. Fib: Anticoag with Eliquis. Amiodarone was stopped during hospitalizaoitn Chronic systolic heart failure. Follows closely with his cardiologist Dr. Angelena Copeland. ON lasix 40 qd - did have low K in hosp which has been replaced but needs to be rechecked.  Home Oxygen: 2L by nasal canula: New since hospitalization????  TRANSAMINITIS???    Patient Active Problem List   Diagnosis Date Noted  . UTI (urinary tract infection) 05/31/2016  . Acute urinary retention 05/31/2016  . Acute kidney injury superimposed on chronic kidney disease ([place]) 05/31/2016  . Generalized weakness 05/31/2016  . Falls 05/31/2016  . Acute systolic CHF  (congestive heart failure) ([place])   . Congestive dilated cardiomyopathy ([place])   . Atrial fibrillation with RVR ([place])   . Uncontrolled hypertension   . Pulmonary hypertension   . Chronic systolic CHF (congestive heart failure) ([place])   . Atrial fibrillation ([place]) 12/19/2015  . Hypertension 12/19/2015  . New onset a-fib ([place]) 12/19/2015  . Acute CHF ([place]) 12/19/2015  . Hyperglycemia 12/19/2015  . Acid reflux   . Hyperlipidemia   . Sinus bradycardia   . Gastroesophageal reflux disease   . Other hyperlipidemia   . Elevated troponin   '  Review of Systems     Objective:   Physical Exam        Assessment & Plan:  Needs to RTC in 5 wks for repeat thyroid function tests. cmp,  ua?

## 2016-06-11 ENCOUNTER — Telehealth: Payer: Self-pay | Admitting: Physician Assistant

## 2016-06-11 ENCOUNTER — Ambulatory Visit: Payer: Medicare Other | Admitting: Family Medicine

## 2016-06-11 DIAGNOSIS — I5022 Chronic systolic (congestive) heart failure: Secondary | ICD-10-CM | POA: Diagnosis not present

## 2016-06-11 DIAGNOSIS — N183 Chronic kidney disease, stage 3 (moderate): Secondary | ICD-10-CM | POA: Diagnosis not present

## 2016-06-11 DIAGNOSIS — I130 Hypertensive heart and chronic kidney disease with heart failure and stage 1 through stage 4 chronic kidney disease, or unspecified chronic kidney disease: Secondary | ICD-10-CM | POA: Diagnosis not present

## 2016-06-11 DIAGNOSIS — N390 Urinary tract infection, site not specified: Secondary | ICD-10-CM | POA: Diagnosis not present

## 2016-06-11 DIAGNOSIS — J189 Pneumonia, unspecified organism: Secondary | ICD-10-CM | POA: Diagnosis not present

## 2016-06-11 DIAGNOSIS — I420 Dilated cardiomyopathy: Secondary | ICD-10-CM | POA: Diagnosis not present

## 2016-06-11 NOTE — Telephone Encounter (Signed)
THIS MESSAGE IS FROM [facility] FROM [facility] FOR Stephen Copeland: PATIENT WAS DISCHARGED FROM [facility] AROUND June 06, 2016. [name] WOULD LIKE TO KNOW IF Stephen WILL SIGN THE NURSING ORDERS AND THE HOME HEALTH AIDE ORDERS? BEST PHONE [phone] ([place]) [place]

## 2016-06-11 NOTE — Telephone Encounter (Signed)
Ok to give verbal 

## 2016-06-11 NOTE — Telephone Encounter (Signed)
THIS MESSAGE IS FROM MARSHA MENDENHALL - OCC. THERAPIST FROM [facility] FOR STEPHANIE ENGLISH: SHE NEEDS TO GET VERBAL ORDERS FOR O.T. TWO TIMES A WEEK FOR THE FIRST WEEK AND THEN 1 TIME A WEEK FOR FOUR WEEKS. BEST PHONE [phone] (MARSHA - OCC. THERAPIST) [place]

## 2016-06-12 ENCOUNTER — Ambulatory Visit (INDEPENDENT_AMBULATORY_CARE_PROVIDER_SITE_OTHER): Payer: Medicare Other | Admitting: Physician Assistant

## 2016-06-12 ENCOUNTER — Ambulatory Visit (INDEPENDENT_AMBULATORY_CARE_PROVIDER_SITE_OTHER): Payer: Medicare Other

## 2016-06-12 ENCOUNTER — Telehealth: Payer: Self-pay | Admitting: Physician Assistant

## 2016-06-12 VITALS — BP 136/85 | HR 82 | Temp 98.2°F | Resp 16 | Wt 165.8 lb

## 2016-06-12 DIAGNOSIS — I509 Heart failure, unspecified: Secondary | ICD-10-CM

## 2016-06-12 DIAGNOSIS — Z09 Encounter for follow-up examination after completed treatment for conditions other than malignant neoplasm: Secondary | ICD-10-CM

## 2016-06-12 DIAGNOSIS — R319 Hematuria, unspecified: Secondary | ICD-10-CM | POA: Diagnosis not present

## 2016-06-12 DIAGNOSIS — R05 Cough: Secondary | ICD-10-CM | POA: Diagnosis not present

## 2016-06-12 LAB — POCT URINALYSIS DIP (MANUAL ENTRY)
BILIRUBIN UA: NEGATIVE
Bilirubin, UA: NEGATIVE
Glucose, UA: NEGATIVE
NITRITE UA: NEGATIVE
PH UA: 5.5 (ref 5.0–8.0)
Spec Grav, UA: 1.02 (ref 1.030–1.035)
Urobilinogen, UA: 1 (ref ?–2.0)

## 2016-06-12 LAB — POC MICROSCOPIC URINALYSIS (UMFC): Mucus: ABSENT

## 2016-06-12 LAB — CMP14+EGFR
A/G RATIO: 1.5 (ref 1.2–2.2)
ALT: 75 IU/L — AB (ref 0–44)
AST: 36 IU/L (ref 0–40)
Albumin: 3.5 g/dL (ref 3.5–4.8)
Alkaline Phosphatase: 141 IU/L — ABNORMAL HIGH (ref 39–117)
BUN/Creatinine Ratio: 16 (ref 10–24)
BUN: 20 mg/dL (ref 8–27)
Bilirubin Total: 1.1 mg/dL (ref 0.0–1.2)
CALCIUM: 9.5 mg/dL (ref 8.6–10.2)
CHLORIDE: 103 mmol/L (ref 96–106)
CO2: 27 mmol/L (ref 18–29)
Creatinine, Ser: 1.24 mg/dL (ref 0.76–1.27)
GFR calc Af Amer: 65 mL/min/{1.73_m2} (ref >59)
GFR, EST NON AFRICAN AMERICAN: 57 mL/min/{1.73_m2} — AB (ref >59)
Globulin, Total: 2.4 g/dL (ref 1.5–4.5)
Glucose: 111 mg/dL — ABNORMAL HIGH (ref 65–99)
POTASSIUM: 4 mmol/L (ref 3.5–5.2)
Sodium: 140 mmol/L (ref 134–144)
Total Protein: 5.9 g/dL — ABNORMAL LOW (ref 6.0–8.5)

## 2016-06-12 NOTE — Patient Instructions (Addendum)
You can finish the antibiotic.  The xray looks unchanged.  Make sure that you go to your appointment with urology.   Please return in     IF you received an x-ray today, you will receive an invoice from [facility] Radiology. Please contact [facility] Radiology at [phone] with questions or concerns regarding your invoice.   IF you received labwork today, you will receive an invoice from [place]. Please contact [facility] at [phone] with questions or concerns regarding your invoice.   Our billing staff will not be able to assist you with questions regarding bills from these companies.  You will be contacted with the lab results as soon as they are available. The fastest way to get your results is to activate your My Chart account. Instructions are located on the last page of this paperwork. If you have not heard from [place] regarding the results in 2 weeks, please contact this office.

## 2016-06-12 NOTE — Progress Notes (Signed)
PRIMARY CARE AT [facility] [address] [place] [address] [phone]  Date:  06/12/2016   Name:  Stephen Copeland   DOB:  Mar 30, 1940   MRN:  [id number]  PCP:  No PCP Per Patient    History of Present Illness:  Stephen Copeland is a 76 y.o. male patient who presents to PCP with  Chief Complaint  Patient presents with  . Follow-up    HOSPITAL, PATIENT NOT SURE OF MEDS TO TAKE     Patient continues to have coughing, non productive. Currently using the oxygen 4L, at home, which he does not complain of breathing issues.  He comes in with the oxygen in the car, as he reports "it is too heavy".   He states that he has improvement of his breathing though he is currently lethargic.    Patient Active Problem List   Diagnosis Date Noted  . UTI (urinary tract infection) 05/31/2016  . Acute urinary retention 05/31/2016  . Acute kidney injury superimposed on chronic kidney disease ([place]) 05/31/2016  . Generalized weakness 05/31/2016  . Falls 05/31/2016  . Acute systolic CHF (congestive heart failure) ([employer])   . Congestive dilated cardiomyopathy ([place])   . Atrial fibrillation with RVR ([place])   . Uncontrolled hypertension   . Pulmonary hypertension   . Chronic systolic CHF (congestive heart failure) ([place])   . Atrial fibrillation ([place]) 12/19/2015  . Hypertension 12/19/2015  . New onset a-fib ([place]) 12/19/2015  . Acute CHF ([place]) 12/19/2015  . Hyperglycemia 12/19/2015  . Acid reflux   . Hyperlipidemia   . Sinus bradycardia   . Gastroesophageal reflux disease   . Other hyperlipidemia   . Elevated troponin     Past Medical History:  Diagnosis Date  . Acid reflux   . Folliculitis   . Hiatal hernia   . Hyperlipidemia    a. pt is adamantly against statins.  . Hypertension   . Ischemia    a. Pt states he was diagnosed with "ischemia" in the 1990s but does not know further details, denies hx of heart blockage.  . Nummular dermatitis   . Scoliosis   . Sinus bradycardia   . Stomach ulcer     a. remote ulcer in the 1990s, no hx of bleeding.    No past surgical history on file.  Social History  Substance Use Topics  . Smoking status: Never Smoker  . Smokeless tobacco: Never Used  . Alcohol use No    Family History  Problem Relation Age of Onset  . Heart disease Mother     Further details not reported, died at age 68  . Valvular heart disease Father     H/o MV surgery, diet at age 78    No Known Allergies  Medication list has been reviewed and updated.  Current Outpatient Prescriptions on File Prior to Visit  Medication Sig Dispense Refill  . apixaban (ELIQUIS) 5 MG TABS tablet Take 1 tablet (5 mg total) by mouth 2 (two) times daily. 180 tablet 3  . ENSURE PLUS (ENSURE PLUS) LIQD Take 237 mLs by mouth daily.    . furosemide (LASIX) 20 MG tablet     . metoprolol tartrate (LOPRESSOR) 25 MG tablet     . nitroGLYCERIN (NITROSTAT) 0.4 MG SL tablet Place 1 tablet (0.4 mg total) under the tongue every 5 (five) minutes as needed for chest pain. 25 tablet 1  . PACERONE 200 MG tablet     . tamsulosin (FLOMAX) 0.4 MG CAPS capsule Take 0.4  mg by mouth daily.    Marland Kitchen atorvastatin (LIPITOR) 20 MG tablet     . cefpodoxime (VANTIN) 200 MG tablet Take 1 tablet (200 mg total) by mouth 2 (two) times daily. 10 tablet 0  . hydrALAZINE (APRESOLINE) 10 MG tablet Take 1 tablet (10 mg total) by mouth every 8 (eight) hours. 90 tablet 1  . potassium chloride SA (K-DUR,KLOR-CON) 20 MEQ tablet Take 2 tablets (40 mEq total) by mouth once. 2 tablet 0   No current facility-administered medications on file prior to visit.     ROS ROS otherwise unremarkable unless listed above.  Physical Examination: BP 136/85 (BP Location: Right Arm, Patient Position: Sitting, Cuff Size: Normal)   Pulse 82   Temp 98.2 F (36.8 C) (Oral)   Resp 16   Wt 165 lb 12.8 oz (75.2 kg)   SpO2 (!) 88% Comment: PER PATIENT HE WILL NEED OUR O2  BMI 21.29 kg/m  Ideal Body Weight:   Wt Readings from Last 3 Encounters:   06/12/16 165 lb 12.8 oz (75.2 kg)  06/06/16 166 lb 9.6 oz (75.6 kg)  05/17/16 183 lb 3.2 oz (83.1 kg)    Physical Exam  Constitutional: He is oriented to person, place, and time. He appears well-developed and well-nourished. No distress.  HENT:  Head: Normocephalic and atraumatic.  Right Ear: Tympanic membrane, external ear and ear canal normal.  Left Ear: Tympanic membrane, external ear and ear canal normal.  Nose: Mucosal edema and rhinorrhea present. Right sinus exhibits no maxillary sinus tenderness and no frontal sinus tenderness. Left sinus exhibits no maxillary sinus tenderness and no frontal sinus tenderness.  Mouth/Throat: No uvula swelling. No oropharyngeal exudate, posterior oropharyngeal edema or posterior oropharyngeal erythema.  Eyes: Conjunctivae, EOM and lids are normal. Pupils are equal, round, and reactive to light. Right eye exhibits normal extraocular motion. Left eye exhibits normal extraocular motion.  Neck: Trachea normal and full passive range of motion without pain. No edema and no erythema present.  Cardiovascular: Normal rate.   Pulmonary/Chest: Effort normal. No apnea. No respiratory distress. He has no decreased breath sounds. He has no wheezes. He has rhonchi (mild inspiratory and expiratory).  Neurological: He is alert and oriented to person, place, and time.  Skin: Skin is warm and dry. He is not diaphoretic.  Psychiatric: He has a normal mood and affect. His behavior is normal.    Results for orders placed or performed in visit on 06/08/16  CBC  Result Value Ref Range   WBC 8.5 3.4 - 10.8 x10E3/uL   RBC 4.82 4.14 - 5.80 x10E6/uL   Hemoglobin 14.9 13.0 - 17.7 g/dL   Hematocrit 45.8 37.5 - 51.0 %   MCV 95 79 - 97 fL   MCH 30.9 26.6 - 33.0 pg   MCHC 32.5 31.5 - 35.7 g/dL   RDW 14.4 12.3 - 15.4 %   Platelets 350 150 - 379 x10E3/uL  CMP14+EGFR  Result Value Ref Range   Glucose 109 (H) 65 - 99 mg/dL   BUN 24 8 - 27 mg/dL   Creatinine, Ser 1.31 (H)  0.76 - 1.27 mg/dL   GFR calc non Af Amer 53 (L) >59 mL/min/1.73   GFR calc Af Amer 61 >59 mL/min/1.73   BUN/Creatinine Ratio 18 10 - 24   Sodium 141 134 - 144 mmol/L   Potassium 4.2 3.5 - 5.2 mmol/L   Chloride 99 96 - 106 mmol/L   CO2 26 18 - 29 mmol/L   Calcium 9.5 8.6 -  10.2 mg/dL   Total Protein 6.4 6.0 - 8.5 g/dL   Albumin 3.8 3.5 - 4.8 g/dL   Globulin, Total 2.6 1.5 - 4.5 g/dL   Albumin/Globulin Ratio 1.5 1.2 - 2.2   Bilirubin Total 1.3 (H) 0.0 - 1.2 mg/dL   Alkaline Phosphatase 133 (H) 39 - 117 IU/L   AST 70 (H) 0 - 40 IU/L   ALT 122 (H) 0 - 44 IU/L  POCT urinalysis dipstick  Result Value Ref Range   Color, UA yellow yellow   Clarity, UA cloudy (A) clear   Glucose, UA negative negative   Bilirubin, UA small (A) negative   Ketones, POC UA negative negative   Spec Grav, UA 1.025 1.030 - 1.035   Blood, UA moderate (A) negative   pH, UA 5.5 5.0 - 8.0   Protein Ur, POC =100 (A) negative   Urobilinogen, UA 0.2 Negative - 2.0   Nitrite, UA Negative Negative   Leukocytes, UA Trace (A) Negative  POCT Microscopic Urinalysis ([facility])  Result Value Ref Range   WBC,UR,HPF,POC Moderate (A) None WBC/hpf   RBC,UR,HPF,POC Moderate (A) None RBC/hpf   Bacteria Many (A) None, Too numerous to count   Mucus Absent Absent   Epithelial Cells, UR Per Microscopy Moderate (A) None, Too numerous to count cells/hpf   Results for orders placed or performed in visit on 06/08/16  CBC  Result Value Ref Range   WBC 8.5 3.4 - 10.8 x10E3/uL   RBC 4.82 4.14 - 5.80 x10E6/uL   Hemoglobin 14.9 13.0 - 17.7 g/dL   Hematocrit 45.8 37.5 - 51.0 %   MCV 95 79 - 97 fL   MCH 30.9 26.6 - 33.0 pg   MCHC 32.5 31.5 - 35.7 g/dL   RDW 14.4 12.3 - 15.4 %   Platelets 350 150 - 379 x10E3/uL  CMP14+EGFR  Result Value Ref Range   Glucose 109 (H) 65 - 99 mg/dL   BUN 24 8 - 27 mg/dL   Creatinine, Ser 1.31 (H) 0.76 - 1.27 mg/dL   GFR calc non Af Amer 53 (L) >59 mL/min/1.73   GFR calc Af Amer 61 >59 mL/min/1.73    BUN/Creatinine Ratio 18 10 - 24   Sodium 141 134 - 144 mmol/L   Potassium 4.2 3.5 - 5.2 mmol/L   Chloride 99 96 - 106 mmol/L   CO2 26 18 - 29 mmol/L   Calcium 9.5 8.6 - 10.2 mg/dL   Total Protein 6.4 6.0 - 8.5 g/dL   Albumin 3.8 3.5 - 4.8 g/dL   Globulin, Total 2.6 1.5 - 4.5 g/dL   Albumin/Globulin Ratio 1.5 1.2 - 2.2   Bilirubin Total 1.3 (H) 0.0 - 1.2 mg/dL   Alkaline Phosphatase 133 (H) 39 - 117 IU/L   AST 70 (H) 0 - 40 IU/L   ALT 122 (H) 0 - 44 IU/L  POCT urinalysis dipstick  Result Value Ref Range   Color, UA yellow yellow   Clarity, UA cloudy (A) clear   Glucose, UA negative negative   Bilirubin, UA small (A) negative   Ketones, POC UA negative negative   Spec Grav, UA 1.025 1.030 - 1.035   Blood, UA moderate (A) negative   pH, UA 5.5 5.0 - 8.0   Protein Ur, POC =100 (A) negative   Urobilinogen, UA 0.2 Negative - 2.0   Nitrite, UA Negative Negative   Leukocytes, UA Trace (A) Negative  POCT Microscopic Urinalysis ([facility])  Result Value Ref Range   WBC,UR,HPF,POC Moderate (A) None  WBC/hpf   RBC,UR,HPF,POC Moderate (A) None RBC/hpf   Bacteria Many (A) None, Too numerous to count   Mucus Absent Absent   Epithelial Cells, UR Per Microscopy Moderate (A) None, Too numerous to count cells/hpf   Ct Abdomen Pelvis Wo Contrast  Result Date: 05/31/2016 CLINICAL DATA:  Hydronephrosis.  Generalized weakness. EXAM: CT ABDOMEN AND PELVIS WITHOUT CONTRAST TECHNIQUE: Multidetector CT imaging of the abdomen and pelvis was performed following the standard protocol without IV contrast. COMPARISON:  Ultrasound same day FINDINGS: Lower chest: Linear atelectasis in both lower lobes. Moderate pericardial effusion. Cardiomegaly. No pleural effusion. Small hiatal hernia. Hepatobiliary: Liver parenchyma is normal. Small calcified stones or hyperdense sludge dependent within the gallbladder. There are multiple stones in the distal common bile duct without ductal dilatation. Pancreas: Normal.  Multiple  stones in the distal common bile duct. Spleen: Normal Adrenals/Urinary Tract: Adrenal glands are normal. Left kidney is normal without contrast. Tiny exit fix cyst lower pole. Right kidney shows no significant parenchymal finding. There is fullness of the renal collecting system an extrarenal pelvis. The proximal ureter is tortuous in the ureter is then dilated all the way to the bladder. No evidence of stone. The prostate gland is markedly enlarged. There are a few small bladder diverticulae. The bladder appears thick walled. Stomach/Bowel: No acute or significant bowel finding. Scattered colonic diverticulae without diverticulitis. Vascular/Lymphatic: Aortic atherosclerosis. No aneurysm. IVC is normal. No retroperitoneal mass or lymphadenopathy. Reproductive: None other significant. Other: Small inguinal hernia on the right containing fat. Musculoskeletal: Chronic lumbar degenerative changes. IMPRESSION: Moderate pericardial effusion. Cardiomegaly. Linear atelectasis or scarring at the lung bases. Gallstones/hyperdense sludge dependent in the gallbladder. Numerous small stones in the distal common bile duct. No evidence of ductal dilatation. Marked enlargement of the prostate. 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 Chronic appearing compensated hydroureteronephrosis on the right, presumably secondary to the chronic bladder disease. No evidence of urinary tract stone disease. Aortic atherosclerosis. Electronically Signed   By: Nelson Chimes M.D.   On: 05/31/2016 16:34   Dg Chest 2 View  Result Date: 06/12/2016 CLINICAL DATA:  Nonproductive cough EXAM: CHEST  2 VIEW COMPARISON:  06/03/2016 FINDINGS: Chronic left ventricular prominence. Chronic aortic atherosclerosis. As seen previously, there is volume loss/infiltrate in both lower lobes consistent with pneumonia. Upper lungs are clear. No visible effusion. Posterior costophrenic angles not included on  the image. Bony structures are normal. IMPRESSION: Volume loss/infiltrate in both lower lobes consistent with pneumonia, similar to the study of 9 days ago. Electronically Signed   By: Nelson Chimes M.D.   On: 06/12/2016 12:42   US Renal  Result Date: 05/31/2016 CLINICAL DATA:  Acute kidney injury. EXAM: RENAL / URINARY TRACT ULTRASOUND COMPLETE COMPARISON:  None. FINDINGS: Right Kidney: Length: 12.7 cm. Echogenicity within normal limits. No mass visualized. Moderate hydronephrosis is noted. Left Kidney: Length: 12.5 cm. Echogenicity within normal limits. No mass visualized. Mild hydronephrosis is noted. Bladder: Mobile debris or sludge is noted within the dependent portion of the urinary bladder. Left ureteral jet is noted. Right she at is not clearly identified. Mildly distended urinary bladder is noted. IMPRESSION: Moderate right hydronephrosis. Mild left hydronephrosis. Mildly distended urinary bladder is noted with left ureteral jet visualized. Mobile debris or sludge is noted in dependent portion of urinary bladder lumen. Electronically Signed   By: Marijo Conception, M.D.   On: 05/31/2016 08:50   Dg Chest Port 1 View  Result Date: 06/03/2016 CLINICAL DATA:  SOB/cough  x3-4 weeks. Pt said he gets up very little phlegm when coughing. Hx of acid reflux, hiatal hernia, hypertension, sinus bradycardia. No surgery to chest. Nonsmoker. EXAM: PORTABLE CHEST 1 VIEW COMPARISON:  05/31/2016 FINDINGS: Cardiac silhouette is borderline enlarged. No mediastinal or hilar masses. No convincing adenopathy. There is left greater than right lung base opacity. On the left this could reflect pneumonia or atelectasis. This has the appearance of atelectasis on the right. Remainder of the lungs is clear. No convincing pleural effusion.  No pneumothorax. IMPRESSION: 1. Possible pneumonia versus atelectasis at the left lung base. 2. Mild right lung base atelectasis. 3. Findings are similar to the prior study. Electronically Signed    By: Lajean Manes M.D.   On: 06/03/2016 10:49   Dg Chest Portable 1 View  Result Date: 05/31/2016 CLINICAL DATA:  Initial evaluation for acute weakness. EXAM: PORTABLE CHEST 1 VIEW COMPARISON:  Prior radiograph from 12/19/2015. FINDINGS: Moderate cardiomegaly, stable from prior. Mediastinal silhouette within normal limits. Aortic atherosclerosis noted. Lungs hypoinflated. Secondary bibasilar and perihilar vascular congestion. Linear scarring noted within the right perihilar region, similar to previous. No pulmonary edema or pleural effusion. No definite focal infiltrates. No pneumothorax. No acute osseus abnormality. IMPRESSION: 1. Shallow lung inflation with secondary bibasilar/perihilar bronchovascular crowding. No other active cardiopulmonary disease. 2. Stable cardiomegaly. 3. Aortic atherosclerosis. Electronically Signed   By: Jeannine Boga M.D.   On: 05/31/2016 00:44     Assessment and Plan: Jazmine Longshore is a 76 y.o. male who is here today for cc of respiratory illness.   --will obtain metabolic panel recheck creatinine.   --finish antibiotic.   --follow with [facility] today.  Hematuria, undiagnosed cause - Plan: POCT Microscopic Urinalysis ([facility]), POCT urinalysis dipstick, CMP14+EGFR, CBC, DG Chest 2 View, Urine culture, CANCELED: BMP8+eGFR, CANCELED: [facility] discharge follow-up - Plan: POCT Microscopic Urinalysis ([facility]), POCT urinalysis dipstick, CMP14+EGFR, CBC, DG Chest 2 View, Brain natriuretic peptide, CANCELED: BMP8+eGFR, CANCELED: CMP14+EGFR  Congestive heart failure, unspecified congestive heart failure chronicity, unspecified congestive heart failure type ([place]) - Plan: Brain natriuretic peptide  Ivar Drape, PA-C [facility] 4/11/201810:46 AM

## 2016-06-12 NOTE — Telephone Encounter (Signed)
l/m with verbal ok

## 2016-06-12 NOTE — Telephone Encounter (Signed)
THIS ENCOUNTER WAS MADE IN ERROR AND IT DOES NOT NEED TO BE ROUTED. PLEASE SEE THE CORRECT PHONE MESSAGES FROM 06/11/2016. ONE IS FROM [place] AND THE OTHER IS FROM [place] FROM [facility]. [place]

## 2016-06-12 NOTE — Telephone Encounter (Signed)
Seen today. 

## 2016-06-12 NOTE — Telephone Encounter (Signed)
I was not planning on calling an antibiotic at this today

## 2016-06-12 NOTE — Telephone Encounter (Signed)
Pt's friend called about an antibiotic that pt said was supposed to be called in to the [facility] in [place]. Pt said he was on hold with someone from our office and the call was dropped. Pt said he only had one pill left. Please advise. Pt callback is [phone].

## 2016-06-12 NOTE — Telephone Encounter (Signed)
Yes that is fine

## 2016-06-13 ENCOUNTER — Telehealth: Payer: Self-pay | Admitting: Physician Assistant

## 2016-06-13 DIAGNOSIS — I130 Hypertensive heart and chronic kidney disease with heart failure and stage 1 through stage 4 chronic kidney disease, or unspecified chronic kidney disease: Secondary | ICD-10-CM | POA: Diagnosis not present

## 2016-06-13 DIAGNOSIS — N390 Urinary tract infection, site not specified: Secondary | ICD-10-CM | POA: Diagnosis not present

## 2016-06-13 DIAGNOSIS — I5022 Chronic systolic (congestive) heart failure: Secondary | ICD-10-CM | POA: Diagnosis not present

## 2016-06-13 DIAGNOSIS — I420 Dilated cardiomyopathy: Secondary | ICD-10-CM | POA: Diagnosis not present

## 2016-06-13 DIAGNOSIS — J189 Pneumonia, unspecified organism: Secondary | ICD-10-CM | POA: Diagnosis not present

## 2016-06-13 DIAGNOSIS — N183 Chronic kidney disease, stage 3 (moderate): Secondary | ICD-10-CM | POA: Diagnosis not present

## 2016-06-13 LAB — CBC
HEMATOCRIT: 42.5 % (ref 37.5–51.0)
HEMOGLOBIN: 14.5 g/dL (ref 13.0–17.7)
MCH: 31.7 pg (ref 26.6–33.0)
MCHC: 34.1 g/dL (ref 31.5–35.7)
MCV: 93 fL (ref 79–97)
Platelets: 455 10*3/uL — ABNORMAL HIGH (ref 150–379)
RBC: 4.58 x10E6/uL (ref 4.14–5.80)
RDW: 14.8 % (ref 12.3–15.4)
WBC: 9.8 10*3/uL (ref 3.4–10.8)

## 2016-06-13 LAB — URINE CULTURE: Organism ID, Bacteria: NO GROWTH

## 2016-06-13 LAB — BRAIN NATRIURETIC PEPTIDE: BNP: 65.2 pg/mL (ref 0.0–100.0)

## 2016-06-13 NOTE — Telephone Encounter (Signed)
PATIENT CALLED STEPHANIE BACK AGAIN TODAY. I TOLD HIM THAT STEPHANIE SAID SHE WAS NOT PLANNING ON CALLING IN A ANTIBOTIC INTO THE PHARMACY FOR HIM. HE WANTS STEPHANIE TO CALL HIM TO GO OVER ALL OF HIS MEDICINES WITH HIM TO SEE WHAT MEDICATIONS IT IS TIME TO GET REFILLED. HE ALSO WANTS HER TO KNOW THAT HE WILL GET THIS CATHETER CHANGED THURSDAY(06/14/16). BEST PHONE [phone] (HOME) [place].

## 2016-06-13 NOTE — Telephone Encounter (Signed)
THIS MESSAGE IS FROM AMY VOGLER (VISITING NURSE) FROM [facility] FOR [place] EN[place]ISH: AMY WAS AT THE PATIENT'S HOME TODAY (06/13/16) AND SHE DID NOT FIND THAT MR. Delira HAD HYDRALAZINE (APRESOLINE) 10 MG FOR HIS HIGH BLOOD PRESSURE. SHE KNOWS Joyce ON Tuesday. HE SHOULD TAKE THIS MEDICINE 3 TIMES A DAY. SHE WOULD LIKE TO HAVE IT CALLED INTO HIS PHARMACY FOR HIM. BEST PHONE [phone] (AMY VOGLER) PHARMACY CHOICE IS [facility] AND THEIR PHONE IS [phone]. [place]

## 2016-06-13 NOTE — Telephone Encounter (Signed)
Please refill appropriate

## 2016-06-14 DIAGNOSIS — R338 Other retention of urine: Secondary | ICD-10-CM | POA: Diagnosis not present

## 2016-06-15 DIAGNOSIS — N183 Chronic kidney disease, stage 3 (moderate): Secondary | ICD-10-CM | POA: Diagnosis not present

## 2016-06-15 DIAGNOSIS — N390 Urinary tract infection, site not specified: Secondary | ICD-10-CM | POA: Diagnosis not present

## 2016-06-15 DIAGNOSIS — I5022 Chronic systolic (congestive) heart failure: Secondary | ICD-10-CM | POA: Diagnosis not present

## 2016-06-15 DIAGNOSIS — I420 Dilated cardiomyopathy: Secondary | ICD-10-CM | POA: Diagnosis not present

## 2016-06-15 DIAGNOSIS — I130 Hypertensive heart and chronic kidney disease with heart failure and stage 1 through stage 4 chronic kidney disease, or unspecified chronic kidney disease: Secondary | ICD-10-CM | POA: Diagnosis not present

## 2016-06-15 DIAGNOSIS — J189 Pneumonia, unspecified organism: Secondary | ICD-10-CM | POA: Diagnosis not present

## 2016-06-16 ENCOUNTER — Ambulatory Visit: Payer: Medicare Other

## 2016-06-18 DIAGNOSIS — I130 Hypertensive heart and chronic kidney disease with heart failure and stage 1 through stage 4 chronic kidney disease, or unspecified chronic kidney disease: Secondary | ICD-10-CM | POA: Diagnosis not present

## 2016-06-18 DIAGNOSIS — J189 Pneumonia, unspecified organism: Secondary | ICD-10-CM | POA: Diagnosis not present

## 2016-06-18 DIAGNOSIS — I5022 Chronic systolic (congestive) heart failure: Secondary | ICD-10-CM | POA: Diagnosis not present

## 2016-06-18 DIAGNOSIS — N390 Urinary tract infection, site not specified: Secondary | ICD-10-CM | POA: Diagnosis not present

## 2016-06-18 DIAGNOSIS — N183 Chronic kidney disease, stage 3 (moderate): Secondary | ICD-10-CM | POA: Diagnosis not present

## 2016-06-18 DIAGNOSIS — I420 Dilated cardiomyopathy: Secondary | ICD-10-CM | POA: Diagnosis not present

## 2016-06-20 DIAGNOSIS — J189 Pneumonia, unspecified organism: Secondary | ICD-10-CM | POA: Diagnosis not present

## 2016-06-20 DIAGNOSIS — I130 Hypertensive heart and chronic kidney disease with heart failure and stage 1 through stage 4 chronic kidney disease, or unspecified chronic kidney disease: Secondary | ICD-10-CM | POA: Diagnosis not present

## 2016-06-20 DIAGNOSIS — I420 Dilated cardiomyopathy: Secondary | ICD-10-CM | POA: Diagnosis not present

## 2016-06-20 DIAGNOSIS — I5022 Chronic systolic (congestive) heart failure: Secondary | ICD-10-CM | POA: Diagnosis not present

## 2016-06-20 DIAGNOSIS — N390 Urinary tract infection, site not specified: Secondary | ICD-10-CM | POA: Diagnosis not present

## 2016-06-20 DIAGNOSIS — N183 Chronic kidney disease, stage 3 (moderate): Secondary | ICD-10-CM | POA: Diagnosis not present

## 2016-06-21 DIAGNOSIS — I5022 Chronic systolic (congestive) heart failure: Secondary | ICD-10-CM | POA: Diagnosis not present

## 2016-06-21 DIAGNOSIS — I420 Dilated cardiomyopathy: Secondary | ICD-10-CM | POA: Diagnosis not present

## 2016-06-21 DIAGNOSIS — J189 Pneumonia, unspecified organism: Secondary | ICD-10-CM | POA: Diagnosis not present

## 2016-06-21 DIAGNOSIS — N390 Urinary tract infection, site not specified: Secondary | ICD-10-CM | POA: Diagnosis not present

## 2016-06-21 DIAGNOSIS — I130 Hypertensive heart and chronic kidney disease with heart failure and stage 1 through stage 4 chronic kidney disease, or unspecified chronic kidney disease: Secondary | ICD-10-CM | POA: Diagnosis not present

## 2016-06-21 DIAGNOSIS — N183 Chronic kidney disease, stage 3 (moderate): Secondary | ICD-10-CM | POA: Diagnosis not present

## 2016-06-22 DIAGNOSIS — I5022 Chronic systolic (congestive) heart failure: Secondary | ICD-10-CM | POA: Diagnosis not present

## 2016-06-22 DIAGNOSIS — N183 Chronic kidney disease, stage 3 (moderate): Secondary | ICD-10-CM | POA: Diagnosis not present

## 2016-06-22 DIAGNOSIS — J189 Pneumonia, unspecified organism: Secondary | ICD-10-CM | POA: Diagnosis not present

## 2016-06-22 DIAGNOSIS — N390 Urinary tract infection, site not specified: Secondary | ICD-10-CM | POA: Diagnosis not present

## 2016-06-22 DIAGNOSIS — I130 Hypertensive heart and chronic kidney disease with heart failure and stage 1 through stage 4 chronic kidney disease, or unspecified chronic kidney disease: Secondary | ICD-10-CM | POA: Diagnosis not present

## 2016-06-22 DIAGNOSIS — I420 Dilated cardiomyopathy: Secondary | ICD-10-CM | POA: Diagnosis not present

## 2016-06-25 DIAGNOSIS — N183 Chronic kidney disease, stage 3 (moderate): Secondary | ICD-10-CM | POA: Diagnosis not present

## 2016-06-25 DIAGNOSIS — I5022 Chronic systolic (congestive) heart failure: Secondary | ICD-10-CM | POA: Diagnosis not present

## 2016-06-25 DIAGNOSIS — I130 Hypertensive heart and chronic kidney disease with heart failure and stage 1 through stage 4 chronic kidney disease, or unspecified chronic kidney disease: Secondary | ICD-10-CM | POA: Diagnosis not present

## 2016-06-25 DIAGNOSIS — I420 Dilated cardiomyopathy: Secondary | ICD-10-CM | POA: Diagnosis not present

## 2016-06-25 DIAGNOSIS — J189 Pneumonia, unspecified organism: Secondary | ICD-10-CM | POA: Diagnosis not present

## 2016-06-25 DIAGNOSIS — N390 Urinary tract infection, site not specified: Secondary | ICD-10-CM | POA: Diagnosis not present

## 2016-06-26 DIAGNOSIS — I130 Hypertensive heart and chronic kidney disease with heart failure and stage 1 through stage 4 chronic kidney disease, or unspecified chronic kidney disease: Secondary | ICD-10-CM | POA: Diagnosis not present

## 2016-06-26 DIAGNOSIS — J189 Pneumonia, unspecified organism: Secondary | ICD-10-CM | POA: Diagnosis not present

## 2016-06-26 DIAGNOSIS — N390 Urinary tract infection, site not specified: Secondary | ICD-10-CM | POA: Diagnosis not present

## 2016-06-26 DIAGNOSIS — I420 Dilated cardiomyopathy: Secondary | ICD-10-CM | POA: Diagnosis not present

## 2016-06-26 DIAGNOSIS — I5022 Chronic systolic (congestive) heart failure: Secondary | ICD-10-CM | POA: Diagnosis not present

## 2016-06-26 DIAGNOSIS — N183 Chronic kidney disease, stage 3 (moderate): Secondary | ICD-10-CM | POA: Diagnosis not present

## 2016-06-27 DIAGNOSIS — I420 Dilated cardiomyopathy: Secondary | ICD-10-CM | POA: Diagnosis not present

## 2016-06-27 DIAGNOSIS — I5022 Chronic systolic (congestive) heart failure: Secondary | ICD-10-CM | POA: Diagnosis not present

## 2016-06-27 DIAGNOSIS — J189 Pneumonia, unspecified organism: Secondary | ICD-10-CM | POA: Diagnosis not present

## 2016-06-27 DIAGNOSIS — N183 Chronic kidney disease, stage 3 (moderate): Secondary | ICD-10-CM | POA: Diagnosis not present

## 2016-06-27 DIAGNOSIS — I130 Hypertensive heart and chronic kidney disease with heart failure and stage 1 through stage 4 chronic kidney disease, or unspecified chronic kidney disease: Secondary | ICD-10-CM | POA: Diagnosis not present

## 2016-06-27 DIAGNOSIS — N390 Urinary tract infection, site not specified: Secondary | ICD-10-CM | POA: Diagnosis not present

## 2016-06-28 DIAGNOSIS — Z0000 Encounter for general adult medical examination without abnormal findings: Secondary | ICD-10-CM | POA: Diagnosis not present

## 2016-06-28 DIAGNOSIS — N390 Urinary tract infection, site not specified: Secondary | ICD-10-CM | POA: Diagnosis not present

## 2016-06-28 DIAGNOSIS — I420 Dilated cardiomyopathy: Secondary | ICD-10-CM | POA: Diagnosis not present

## 2016-06-28 DIAGNOSIS — J189 Pneumonia, unspecified organism: Secondary | ICD-10-CM | POA: Diagnosis not present

## 2016-06-28 DIAGNOSIS — N183 Chronic kidney disease, stage 3 (moderate): Secondary | ICD-10-CM | POA: Diagnosis not present

## 2016-06-28 DIAGNOSIS — I5022 Chronic systolic (congestive) heart failure: Secondary | ICD-10-CM | POA: Diagnosis not present

## 2016-06-28 DIAGNOSIS — I4891 Unspecified atrial fibrillation: Secondary | ICD-10-CM | POA: Diagnosis not present

## 2016-06-28 DIAGNOSIS — I10 Essential (primary) hypertension: Secondary | ICD-10-CM | POA: Diagnosis not present

## 2016-06-28 DIAGNOSIS — I130 Hypertensive heart and chronic kidney disease with heart failure and stage 1 through stage 4 chronic kidney disease, or unspecified chronic kidney disease: Secondary | ICD-10-CM | POA: Diagnosis not present

## 2016-06-28 DIAGNOSIS — R001 Bradycardia, unspecified: Secondary | ICD-10-CM | POA: Diagnosis not present

## 2016-06-28 DIAGNOSIS — M6281 Muscle weakness (generalized): Secondary | ICD-10-CM | POA: Diagnosis not present

## 2016-06-30 DIAGNOSIS — I5022 Chronic systolic (congestive) heart failure: Secondary | ICD-10-CM | POA: Diagnosis not present

## 2016-06-30 DIAGNOSIS — I4891 Unspecified atrial fibrillation: Secondary | ICD-10-CM | POA: Diagnosis not present

## 2016-07-02 DIAGNOSIS — I420 Dilated cardiomyopathy: Secondary | ICD-10-CM | POA: Diagnosis not present

## 2016-07-02 DIAGNOSIS — N183 Chronic kidney disease, stage 3 (moderate): Secondary | ICD-10-CM | POA: Diagnosis not present

## 2016-07-02 DIAGNOSIS — I5022 Chronic systolic (congestive) heart failure: Secondary | ICD-10-CM | POA: Diagnosis not present

## 2016-07-02 DIAGNOSIS — N390 Urinary tract infection, site not specified: Secondary | ICD-10-CM | POA: Diagnosis not present

## 2016-07-02 DIAGNOSIS — J189 Pneumonia, unspecified organism: Secondary | ICD-10-CM | POA: Diagnosis not present

## 2016-07-02 DIAGNOSIS — I130 Hypertensive heart and chronic kidney disease with heart failure and stage 1 through stage 4 chronic kidney disease, or unspecified chronic kidney disease: Secondary | ICD-10-CM | POA: Diagnosis not present

## 2016-07-05 DIAGNOSIS — I5022 Chronic systolic (congestive) heart failure: Secondary | ICD-10-CM | POA: Diagnosis not present

## 2016-07-05 DIAGNOSIS — N183 Chronic kidney disease, stage 3 (moderate): Secondary | ICD-10-CM | POA: Diagnosis not present

## 2016-07-05 DIAGNOSIS — I130 Hypertensive heart and chronic kidney disease with heart failure and stage 1 through stage 4 chronic kidney disease, or unspecified chronic kidney disease: Secondary | ICD-10-CM | POA: Diagnosis not present

## 2016-07-05 DIAGNOSIS — I420 Dilated cardiomyopathy: Secondary | ICD-10-CM | POA: Diagnosis not present

## 2016-07-05 DIAGNOSIS — N390 Urinary tract infection, site not specified: Secondary | ICD-10-CM | POA: Diagnosis not present

## 2016-07-05 DIAGNOSIS — J189 Pneumonia, unspecified organism: Secondary | ICD-10-CM | POA: Diagnosis not present

## 2016-07-10 DIAGNOSIS — I5022 Chronic systolic (congestive) heart failure: Secondary | ICD-10-CM | POA: Diagnosis not present

## 2016-07-10 DIAGNOSIS — N183 Chronic kidney disease, stage 3 (moderate): Secondary | ICD-10-CM | POA: Diagnosis not present

## 2016-07-10 DIAGNOSIS — I130 Hypertensive heart and chronic kidney disease with heart failure and stage 1 through stage 4 chronic kidney disease, or unspecified chronic kidney disease: Secondary | ICD-10-CM | POA: Diagnosis not present

## 2016-07-10 DIAGNOSIS — J189 Pneumonia, unspecified organism: Secondary | ICD-10-CM | POA: Diagnosis not present

## 2016-07-10 DIAGNOSIS — N390 Urinary tract infection, site not specified: Secondary | ICD-10-CM | POA: Diagnosis not present

## 2016-07-10 DIAGNOSIS — I420 Dilated cardiomyopathy: Secondary | ICD-10-CM | POA: Diagnosis not present

## 2016-07-17 DIAGNOSIS — J189 Pneumonia, unspecified organism: Secondary | ICD-10-CM | POA: Diagnosis not present

## 2016-07-17 DIAGNOSIS — I420 Dilated cardiomyopathy: Secondary | ICD-10-CM | POA: Diagnosis not present

## 2016-07-17 DIAGNOSIS — N390 Urinary tract infection, site not specified: Secondary | ICD-10-CM | POA: Diagnosis not present

## 2016-07-17 DIAGNOSIS — I130 Hypertensive heart and chronic kidney disease with heart failure and stage 1 through stage 4 chronic kidney disease, or unspecified chronic kidney disease: Secondary | ICD-10-CM | POA: Diagnosis not present

## 2016-07-17 DIAGNOSIS — N183 Chronic kidney disease, stage 3 (moderate): Secondary | ICD-10-CM | POA: Diagnosis not present

## 2016-07-17 DIAGNOSIS — I5022 Chronic systolic (congestive) heart failure: Secondary | ICD-10-CM | POA: Diagnosis not present

## 2016-07-19 ENCOUNTER — Other Ambulatory Visit: Payer: Self-pay | Admitting: Cardiovascular Disease

## 2016-07-19 MED ORDER — FUROSEMIDE 20 MG PO TABS: 20.0000 mg | ORAL_TABLET | Freq: Every day | ORAL | 3 refills | Status: DC

## 2016-07-25 DIAGNOSIS — I5022 Chronic systolic (congestive) heart failure: Secondary | ICD-10-CM | POA: Diagnosis not present

## 2016-07-25 DIAGNOSIS — N183 Chronic kidney disease, stage 3 (moderate): Secondary | ICD-10-CM | POA: Diagnosis not present

## 2016-07-25 DIAGNOSIS — J189 Pneumonia, unspecified organism: Secondary | ICD-10-CM | POA: Diagnosis not present

## 2016-07-25 DIAGNOSIS — N390 Urinary tract infection, site not specified: Secondary | ICD-10-CM | POA: Diagnosis not present

## 2016-07-25 DIAGNOSIS — I130 Hypertensive heart and chronic kidney disease with heart failure and stage 1 through stage 4 chronic kidney disease, or unspecified chronic kidney disease: Secondary | ICD-10-CM | POA: Diagnosis not present

## 2016-07-25 DIAGNOSIS — I420 Dilated cardiomyopathy: Secondary | ICD-10-CM | POA: Diagnosis not present

## 2016-07-27 DIAGNOSIS — I5022 Chronic systolic (congestive) heart failure: Secondary | ICD-10-CM | POA: Diagnosis not present

## 2016-07-27 DIAGNOSIS — I4891 Unspecified atrial fibrillation: Secondary | ICD-10-CM | POA: Diagnosis not present

## 2016-07-27 DIAGNOSIS — M6281 Muscle weakness (generalized): Secondary | ICD-10-CM | POA: Diagnosis not present

## 2016-07-27 DIAGNOSIS — I10 Essential (primary) hypertension: Secondary | ICD-10-CM | POA: Diagnosis not present

## 2016-08-01 DIAGNOSIS — I420 Dilated cardiomyopathy: Secondary | ICD-10-CM | POA: Diagnosis not present

## 2016-08-01 DIAGNOSIS — J189 Pneumonia, unspecified organism: Secondary | ICD-10-CM | POA: Diagnosis not present

## 2016-08-01 DIAGNOSIS — N183 Chronic kidney disease, stage 3 (moderate): Secondary | ICD-10-CM | POA: Diagnosis not present

## 2016-08-01 DIAGNOSIS — N390 Urinary tract infection, site not specified: Secondary | ICD-10-CM | POA: Diagnosis not present

## 2016-08-01 DIAGNOSIS — I5022 Chronic systolic (congestive) heart failure: Secondary | ICD-10-CM | POA: Diagnosis not present

## 2016-08-01 DIAGNOSIS — I130 Hypertensive heart and chronic kidney disease with heart failure and stage 1 through stage 4 chronic kidney disease, or unspecified chronic kidney disease: Secondary | ICD-10-CM | POA: Diagnosis not present

## 2016-08-06 DIAGNOSIS — Z8701 Personal history of pneumonia (recurrent): Secondary | ICD-10-CM | POA: Diagnosis not present

## 2016-08-06 DIAGNOSIS — N183 Chronic kidney disease, stage 3 (moderate): Secondary | ICD-10-CM | POA: Diagnosis not present

## 2016-08-06 DIAGNOSIS — E785 Hyperlipidemia, unspecified: Secondary | ICD-10-CM | POA: Diagnosis not present

## 2016-08-06 DIAGNOSIS — I420 Dilated cardiomyopathy: Secondary | ICD-10-CM | POA: Diagnosis not present

## 2016-08-06 DIAGNOSIS — R296 Repeated falls: Secondary | ICD-10-CM | POA: Diagnosis not present

## 2016-08-06 DIAGNOSIS — Z7901 Long term (current) use of anticoagulants: Secondary | ICD-10-CM | POA: Diagnosis not present

## 2016-08-06 DIAGNOSIS — Z87440 Personal history of urinary (tract) infections: Secondary | ICD-10-CM | POA: Diagnosis not present

## 2016-08-06 DIAGNOSIS — I5022 Chronic systolic (congestive) heart failure: Secondary | ICD-10-CM | POA: Diagnosis not present

## 2016-08-06 DIAGNOSIS — I130 Hypertensive heart and chronic kidney disease with heart failure and stage 1 through stage 4 chronic kidney disease, or unspecified chronic kidney disease: Secondary | ICD-10-CM | POA: Diagnosis not present

## 2016-08-06 DIAGNOSIS — I4891 Unspecified atrial fibrillation: Secondary | ICD-10-CM | POA: Diagnosis not present

## 2016-08-06 DIAGNOSIS — R7303 Prediabetes: Secondary | ICD-10-CM | POA: Diagnosis not present

## 2016-08-08 ENCOUNTER — Telehealth: Payer: Self-pay | Admitting: Cardiovascular Disease

## 2016-08-08 DIAGNOSIS — I4891 Unspecified atrial fibrillation: Secondary | ICD-10-CM | POA: Diagnosis not present

## 2016-08-08 DIAGNOSIS — R079 Chest pain, unspecified: Secondary | ICD-10-CM | POA: Diagnosis not present

## 2016-08-08 DIAGNOSIS — I420 Dilated cardiomyopathy: Secondary | ICD-10-CM | POA: Diagnosis not present

## 2016-08-08 DIAGNOSIS — I130 Hypertensive heart and chronic kidney disease with heart failure and stage 1 through stage 4 chronic kidney disease, or unspecified chronic kidney disease: Secondary | ICD-10-CM | POA: Diagnosis not present

## 2016-08-08 DIAGNOSIS — I5022 Chronic systolic (congestive) heart failure: Secondary | ICD-10-CM | POA: Diagnosis not present

## 2016-08-08 DIAGNOSIS — N183 Chronic kidney disease, stage 3 (moderate): Secondary | ICD-10-CM | POA: Diagnosis not present

## 2016-08-08 DIAGNOSIS — R296 Repeated falls: Secondary | ICD-10-CM | POA: Diagnosis not present

## 2016-08-08 NOTE — Telephone Encounter (Signed)
I spoke with our scheduling department and [facility] requested we call pt to discuss.  She also requested appt for pt and this has been scheduled for 08/14/16 with B. Rosita Fire, [place]. I spoke with pt who reports amiodarone was stopped when he was in the hospital.  Discharge summary from 06/06/16 states pt is to stop amiodarone.  He will stay off this medicine. Pt reports he has not been taking Eliquis.  He stopped this on his own.  He does not think anyone told him to stop this medicine.  He reports he is not having anymore bleeding in his urine.  He will resume Eliquis.   Pt is aware of appt on 08/14/16 at 10:00 with B. Rosita Fire, [place]. I asked pt to bring all medications he is currently taking to this appointment.

## 2016-08-08 NOTE — Telephone Encounter (Signed)
New message    Baker Janus, RN from [facility] is calling stating that she called pt primary doctor about pt, but they advised he follow up with cardiology. She states pt is not taking his eliquis or pacerone.

## 2016-08-14 ENCOUNTER — Encounter: Payer: Self-pay | Admitting: Cardiology

## 2016-08-14 ENCOUNTER — Telehealth: Payer: Self-pay

## 2016-08-14 ENCOUNTER — Ambulatory Visit (INDEPENDENT_AMBULATORY_CARE_PROVIDER_SITE_OTHER): Payer: Medicare Other | Admitting: Cardiology

## 2016-08-14 VITALS — HR 72 | Ht 74.5 in | Wt 169.1 lb

## 2016-08-14 DIAGNOSIS — I4891 Unspecified atrial fibrillation: Secondary | ICD-10-CM | POA: Diagnosis not present

## 2016-08-14 DIAGNOSIS — I481 Persistent atrial fibrillation: Secondary | ICD-10-CM | POA: Diagnosis not present

## 2016-08-14 DIAGNOSIS — R296 Repeated falls: Secondary | ICD-10-CM | POA: Diagnosis not present

## 2016-08-14 DIAGNOSIS — N183 Chronic kidney disease, stage 3 (moderate): Secondary | ICD-10-CM | POA: Diagnosis not present

## 2016-08-14 DIAGNOSIS — R748 Abnormal levels of other serum enzymes: Secondary | ICD-10-CM | POA: Diagnosis not present

## 2016-08-14 DIAGNOSIS — I420 Dilated cardiomyopathy: Secondary | ICD-10-CM | POA: Diagnosis not present

## 2016-08-14 DIAGNOSIS — I480 Paroxysmal atrial fibrillation: Secondary | ICD-10-CM

## 2016-08-14 DIAGNOSIS — I130 Hypertensive heart and chronic kidney disease with heart failure and stage 1 through stage 4 chronic kidney disease, or unspecified chronic kidney disease: Secondary | ICD-10-CM | POA: Diagnosis not present

## 2016-08-14 DIAGNOSIS — I5022 Chronic systolic (congestive) heart failure: Secondary | ICD-10-CM | POA: Diagnosis not present

## 2016-08-14 LAB — COMPREHENSIVE METABOLIC PANEL
ALT: 22 IU/L (ref 0–44)
AST: 23 IU/L (ref 0–40)
Albumin/Globulin Ratio: 1.6 (ref 1.2–2.2)
Albumin: 4.3 g/dL (ref 3.5–4.8)
Alkaline Phosphatase: 159 IU/L — ABNORMAL HIGH (ref 39–117)
BUN/Creatinine Ratio: 14 (ref 10–24)
BUN: 21 mg/dL (ref 8–27)
Bilirubin Total: 0.7 mg/dL (ref 0.0–1.2)
CALCIUM: 10 mg/dL (ref 8.6–10.2)
CO2: 25 mmol/L (ref 20–29)
CREATININE: 1.45 mg/dL — AB (ref 0.76–1.27)
Chloride: 103 mmol/L (ref 96–106)
GFR, EST AFRICAN AMERICAN: 54 mL/min/{1.73_m2} — AB (ref >59)
GFR, EST NON AFRICAN AMERICAN: 47 mL/min/{1.73_m2} — AB (ref >59)
GLOBULIN, TOTAL: 2.7 g/dL (ref 1.5–4.5)
Glucose: 100 mg/dL — ABNORMAL HIGH (ref 65–99)
Potassium: 3.7 mmol/L (ref 3.5–5.2)
Sodium: 146 mmol/L — ABNORMAL HIGH (ref 134–144)
TOTAL PROTEIN: 7 g/dL (ref 6.0–8.5)

## 2016-08-14 LAB — TSH: TSH: 1.72 u[IU]/mL (ref 0.450–4.500)

## 2016-08-14 MED ORDER — HYDRALAZINE HCL 10 MG PO TABS: 10.0000 mg | ORAL_TABLET | Freq: Three times a day (TID) | ORAL | 1 refills | Status: DC

## 2016-08-14 MED ORDER — AMIODARONE HCL 200 MG PO TABS: 200.0000 mg | ORAL_TABLET | Freq: Every day | ORAL | 3 refills | Status: DC

## 2016-08-14 NOTE — Patient Instructions (Addendum)
  Medication Instructions:  Your physician recommends that you continue on your current medications as directed. Please refer to the Current Medication list given to you today.   Labwork: Your physician recommends that you return for lab work today for TSH, CMP  Testing/Procedures: None Ordered   Follow-Up: Your physician recommends that you schedule a follow-up appointment in: keep appointment with Dr. [name].  Any Other Special Instructions Will Be Listed Below (If Applicable).     If you need a refill on your cardiac medications before your next appointment, please call your pharmacy.

## 2016-08-14 NOTE — Progress Notes (Signed)
08/14/2016 Stephen Copeland   31-May-1940  [id number]  Primary Physician Patient, No Pcp Per Primary Cardiologist: Dr. [name]   Reason for Visit/CC: F/u for Atrial Fibrillation, post hospital   HPI:  Stephen Copeland is a 76 y.o. male who is being seen today for post hospital f/u.   He is followed by Dr. [name]. He has a history of hypertension, hyperlipidemia, sinus bradycardia, hiatal hernia, GERD, atrial fibrillation with chronic left bundle branch block. He was admitted in October 2017 for new onset atrial fibrillation w/ RVR. His atrial fibrillation was treated with amiodarone and Eliquis and he converted back to normal sinus rhythm. He had an echocardiogram which showed mildly reduced left ventricular ejection fraction at 40%. Subsequently, he underwent stress testing which was a high risk study with inferior and apical thinning defect in the mid anterior wall and apex. Plan was to pursue definitive cardiac catheterization however this was delayed due to development blackish/greenish stools. GI w/u was negative. Dr. [name] discussed rescheduling cath, however the patient declined as he was not having any CP or dyspnea.   Most recently, he was admitted to the hospital by IM for sepsis in the setting of UTI, acute kidney injury, pneumonia and acute CHF. During his hospitalization, he was noted to have elevation in hepatic enzymes. AST was 97 and ALT was 133. Hepatitis panel was negative. Imaging of the gallbladder showed numerous small stones in the distal common bile however no evidence of ductal dilatation. It was felt that this was likely multi-factorial secondary to transamination from hepatic congestion due to CHF + medications (amiodarone and Lipitor-- both discontinued). This along with his other medical issues was treated/managed by internal medicine. Once stable from a medical standpoint, he was discharged home. It was recommended that he be discharged to a skilled nursing facility for  temporary rehabilitation however the patient refused. He was instructed to follow-up in our office for reevaluation of his atrial fibrillation given his amiodarone was discontinued as well as for repeat laboratory work to reassess hepatic enzymes.  Today in clinic he reports that he has done fairly well. He denies any issues with palpitations, chest pain or dyspnea. He has been avoiding amiodarone as directed. EKG today shows sinus rhythm with first-degree AV block and occasional PVCs. Heart rate is 81 bpm. Blood pressure is stable.  Current Meds  Medication Sig  . apixaban (ELIQUIS) 5 MG TABS tablet Take 1 tablet (5 mg total) by mouth 2 (two) times daily.  Marland Kitchen atorvastatin (LIPITOR) 20 MG tablet   . ENSURE PLUS (ENSURE PLUS) LIQD Take 237 mLs by mouth daily.  . furosemide (LASIX) 20 MG tablet Take 1 tablet (20 mg total) by mouth daily.  . metoprolol tartrate (LOPRESSOR) 25 MG tablet   . nitroGLYCERIN (NITROSTAT) 0.4 MG SL tablet Place 1 tablet (0.4 mg total) under the tongue every 5 (five) minutes as needed for chest pain.  . tamsulosin (FLOMAX) 0.4 MG CAPS capsule Take 0.4 mg by mouth daily.  . [DISCONTINUED] PACERONE 200 MG tablet    No Known Allergies Past Medical History:  Diagnosis Date  . Acid reflux   . Folliculitis   . Hiatal hernia   . Hyperlipidemia    a. pt is adamantly against statins.  . Hypertension   . Ischemia    a. Pt states he was diagnosed with "ischemia" in the 1990s but does not know further details, denies hx of heart blockage.  . Nummular dermatitis   . Scoliosis   . Sinus  bradycardia   . Stomach ulcer    a. remote ulcer in the 1990s, no hx of bleeding.   Family History  Problem Relation Age of Onset  . Heart disease Mother        Further details not reported, died at age 90  . Valvular heart disease Father        H/o MV surgery, diet at age 51   No past surgical history on file. Social History   Social History  . Marital status: Married    Spouse  name: N/A  . Number of children: N/A  . Years of education: N/A   Occupational History  . retired [employer]   Social History Main Topics  . Smoking status: Never Smoker  . Smokeless tobacco: Never Used  . Alcohol use No  . Drug use: No  . Sexual activity: Not on file   Other Topics Concern  . Not on file   Social History Narrative  . No narrative on file     Review of Systems: General: negative for chills, fever, night sweats or weight changes.  Cardiovascular: negative for chest pain, dyspnea on exertion, edema, orthopnea, palpitations, paroxysmal nocturnal dyspnea or shortness of breath Dermatological: negative for rash Respiratory: negative for cough or wheezing Urologic: negative for hematuria Abdominal: negative for nausea, vomiting, diarrhea, bright red blood per rectum, melena, or hematemesis Neurologic: negative for visual changes, syncope, or dizziness All other systems reviewed and are otherwise negative except as noted above.   Physical Exam:  Pulse 72, height 6' 2.5" (1.892 m), weight 169 lb 2 oz (76.7 kg), SpO2 96 %.  General appearance: alert, cooperative and no distress Neck: no carotid bruit and no JVD Lungs: clear to auscultation bilaterally Heart: regular rate and rhythm, S1, S2 normal, no murmur, click, rub or gallop Extremities: extremities normal, atraumatic, no cyanosis or edema Pulses: 2+ and symmetric Skin: Skin color, texture, turgor normal. No rashes or lesions Neurologic: Grossly normal  EKG SR with PVCs 1st degree AVB -- personally reviewed   ASSESSMENT AND PLAN:   1. PAF: Amiodarone recently discontinued during recent hospitalization due to elevated hepatic enzymes. EKG today shows sinus rhythm with PVCs. He denies any symptoms of breakthrough atrial fibrillation. His heart rate is well-controlled. We'll continue to avoid amiodarone for now and will repeat hepatic function tests. Continue Eliquis was for anticoagulation.  2. Chronic Systolic  HF/ Cardiomyopathy: It is unclear if his cardiomyopathy is due to rapid atrial fibrillation versus underlying coronary artery disease. He had an abnormal stress test in the fall of 2017 however this was not pursued as he was having some issues with bleeding. He has not had any chest discomfort thus patient continues to decline cardiac catheterization. He appears euvolemic on physical exam. No edema. He denies any significant dyspnea. He is on daily Lasix. Continue medical therapy.  3. Abnormal Hepatic Function Test: During his hospitalization, he was noted to have elevation in hepatic enzymes. AST was 97 and ALT was 133. Hepatitis panel was negative. Imaging of the gallbladder showed numerous small stones in the distal common bile however no evidence of ductal dilatation. It was felt that this was likely multi-factorial secondary to transamination from hepatic congestion due to CHF + medications (amiodarone and Lipitor >> amiodarone discontinued). Recheck HFTs today. Continue to avoid amiodarone.    Follow-Up: keep f/u with Dr. [name] in August.   [facility] Ladoris Gene, MHS [facility] 08/14/2016 1:06 PM

## 2016-08-14 NOTE — Telephone Encounter (Signed)
A user error has taken place.

## 2016-08-15 ENCOUNTER — Telehealth: Payer: Self-pay | Admitting: Cardiology

## 2016-08-15 DIAGNOSIS — E870 Hyperosmolality and hypernatremia: Secondary | ICD-10-CM

## 2016-08-15 NOTE — Telephone Encounter (Signed)
This encounter was created in error - please disregard.

## 2016-08-15 NOTE — Telephone Encounter (Addendum)
Patient aware of lab results. Will send fax of lab results and instructions to patient's PCP Dr. Lilli Few [phone]) and Amy the home health nurse with [facility] (phone [phone] Fax [phone]). Patient has repeat lab work on 08/22/16 at [facility].

## 2016-08-15 NOTE — Telephone Encounter (Signed)
Follow Up: ° ° °Returning your call,concerning his lab results. °

## 2016-08-22 ENCOUNTER — Other Ambulatory Visit: Payer: Medicare Other | Admitting: *Deleted

## 2016-08-22 DIAGNOSIS — I5022 Chronic systolic (congestive) heart failure: Secondary | ICD-10-CM | POA: Diagnosis not present

## 2016-08-22 DIAGNOSIS — E870 Hyperosmolality and hypernatremia: Secondary | ICD-10-CM

## 2016-08-22 LAB — COMPREHENSIVE METABOLIC PANEL
ALK PHOS: 146 IU/L — AB (ref 39–117)
ALT: 20 IU/L (ref 0–44)
AST: 18 IU/L (ref 0–40)
Albumin/Globulin Ratio: 1.6 (ref 1.2–2.2)
Albumin: 4 g/dL (ref 3.5–4.8)
BUN/Creatinine Ratio: 14 (ref 10–24)
BUN: 21 mg/dL (ref 8–27)
Bilirubin Total: 0.8 mg/dL (ref 0.0–1.2)
CALCIUM: 9.3 mg/dL (ref 8.6–10.2)
CO2: 24 mmol/L (ref 20–29)
CREATININE: 1.48 mg/dL — AB (ref 0.76–1.27)
Chloride: 105 mmol/L (ref 96–106)
GFR calc Af Amer: 53 mL/min/{1.73_m2} — ABNORMAL LOW (ref >59)
GFR, EST NON AFRICAN AMERICAN: 46 mL/min/{1.73_m2} — AB (ref >59)
GLUCOSE: 94 mg/dL (ref 65–99)
Globulin, Total: 2.5 g/dL (ref 1.5–4.5)
Potassium: 3.9 mmol/L (ref 3.5–5.2)
SODIUM: 145 mmol/L — AB (ref 134–144)
Total Protein: 6.5 g/dL (ref 6.0–8.5)

## 2016-08-24 ENCOUNTER — Telehealth: Payer: Self-pay | Admitting: Cardiovascular Disease

## 2016-08-24 NOTE — Telephone Encounter (Signed)
Follow Up:     Returning call,concerning his lab results.

## 2016-08-24 NOTE — Telephone Encounter (Signed)
Patient aware of lab results.

## 2016-09-05 DIAGNOSIS — R296 Repeated falls: Secondary | ICD-10-CM | POA: Diagnosis not present

## 2016-09-05 DIAGNOSIS — I420 Dilated cardiomyopathy: Secondary | ICD-10-CM | POA: Diagnosis not present

## 2016-09-05 DIAGNOSIS — I5022 Chronic systolic (congestive) heart failure: Secondary | ICD-10-CM | POA: Diagnosis not present

## 2016-09-05 DIAGNOSIS — N183 Chronic kidney disease, stage 3 (moderate): Secondary | ICD-10-CM | POA: Diagnosis not present

## 2016-09-05 DIAGNOSIS — I130 Hypertensive heart and chronic kidney disease with heart failure and stage 1 through stage 4 chronic kidney disease, or unspecified chronic kidney disease: Secondary | ICD-10-CM | POA: Diagnosis not present

## 2016-09-05 DIAGNOSIS — I4891 Unspecified atrial fibrillation: Secondary | ICD-10-CM | POA: Diagnosis not present

## 2016-09-17 DIAGNOSIS — I420 Dilated cardiomyopathy: Secondary | ICD-10-CM | POA: Diagnosis not present

## 2016-09-17 DIAGNOSIS — I130 Hypertensive heart and chronic kidney disease with heart failure and stage 1 through stage 4 chronic kidney disease, or unspecified chronic kidney disease: Secondary | ICD-10-CM | POA: Diagnosis not present

## 2016-09-17 DIAGNOSIS — N183 Chronic kidney disease, stage 3 (moderate): Secondary | ICD-10-CM | POA: Diagnosis not present

## 2016-09-17 DIAGNOSIS — R296 Repeated falls: Secondary | ICD-10-CM | POA: Diagnosis not present

## 2016-09-17 DIAGNOSIS — I4891 Unspecified atrial fibrillation: Secondary | ICD-10-CM | POA: Diagnosis not present

## 2016-09-17 DIAGNOSIS — I5022 Chronic systolic (congestive) heart failure: Secondary | ICD-10-CM | POA: Diagnosis not present

## 2016-09-19 DIAGNOSIS — I4891 Unspecified atrial fibrillation: Secondary | ICD-10-CM | POA: Diagnosis not present

## 2016-09-19 DIAGNOSIS — M6281 Muscle weakness (generalized): Secondary | ICD-10-CM | POA: Diagnosis not present

## 2016-09-19 DIAGNOSIS — I10 Essential (primary) hypertension: Secondary | ICD-10-CM | POA: Diagnosis not present

## 2016-09-19 DIAGNOSIS — I5022 Chronic systolic (congestive) heart failure: Secondary | ICD-10-CM | POA: Diagnosis not present

## 2016-09-19 DIAGNOSIS — L989 Disorder of the skin and subcutaneous tissue, unspecified: Secondary | ICD-10-CM | POA: Diagnosis not present

## 2016-10-03 DIAGNOSIS — I420 Dilated cardiomyopathy: Secondary | ICD-10-CM | POA: Diagnosis not present

## 2016-10-03 DIAGNOSIS — N183 Chronic kidney disease, stage 3 (moderate): Secondary | ICD-10-CM | POA: Diagnosis not present

## 2016-10-03 DIAGNOSIS — E785 Hyperlipidemia, unspecified: Secondary | ICD-10-CM | POA: Diagnosis not present

## 2016-10-03 DIAGNOSIS — R296 Repeated falls: Secondary | ICD-10-CM | POA: Diagnosis not present

## 2016-10-03 DIAGNOSIS — I130 Hypertensive heart and chronic kidney disease with heart failure and stage 1 through stage 4 chronic kidney disease, or unspecified chronic kidney disease: Secondary | ICD-10-CM | POA: Diagnosis not present

## 2016-10-03 DIAGNOSIS — R7303 Prediabetes: Secondary | ICD-10-CM | POA: Diagnosis not present

## 2016-10-03 DIAGNOSIS — I5022 Chronic systolic (congestive) heart failure: Secondary | ICD-10-CM | POA: Diagnosis not present

## 2016-10-03 DIAGNOSIS — I4891 Unspecified atrial fibrillation: Secondary | ICD-10-CM | POA: Diagnosis not present

## 2016-10-16 NOTE — Progress Notes (Signed)
Chief Complaint  Patient presents with  . Follow-up    atrial fibrillation   History of Present Illness: 76 yo male with history of HTN, HLD, sinus bradycardia, hiatal hernia, GERD, atrial fibrillation with LBBB who is here today for cardiac follow up. He was admitted to [facility] in October 2017 with rapid atrial fibrillation. He was started on amiodarone and Eliquis and converted to NSR. Echo with LVEF=40%. Outpatient stress test November 2017 was a high risk study with inferior and apical thinning with a perfusion defect in the mid anterior wall and apex. We had planned a cardiac cath but he c/o blackish green stools, cough and had no chest pain so cath was delayed.  He began having blood in his urine in February 2018 and was seen in Urology. Eliquis held for several days. He was started on Flomax. His hematuria resolved. He was seen in follow up and had no complaints. Lower extremity edema resolved with Lasix. He was admitted to [facility] March 2018 with UTI/sepsis/Pneumonia and had elevated LFTs. Amiodarone and Lipitor were stopped. His metoprolol was stopped for unclear reasons.   He is here today for follow up. The patient denies any chest pain, dyspnea, palpitations, lower extremity edema, orthopnea, PND, dizziness, near syncope or syncope.     Primary Care Physician: Clovia Cuff, MD [facility])  Past Medical History:  Diagnosis Date  . Acid reflux   . Folliculitis   . Hiatal hernia   . Hyperlipidemia    a. pt is adamantly against statins.  . Hypertension   . Ischemia    a. Pt states he was diagnosed with "ischemia" in the 1990s but does not know further details, denies hx of heart blockage.  . Nummular dermatitis   . Scoliosis   . Sinus bradycardia   . Stomach ulcer    a. remote ulcer in the 1990s, no hx of bleeding.    History reviewed. No pertinent surgical history.  Current Outpatient Prescriptions  Medication Sig Dispense Refill  . apixaban (ELIQUIS) 5 MG TABS  tablet Take 1 tablet (5 mg total) by mouth 2 (two) times daily. 180 tablet 1  . furosemide (LASIX) 40 MG tablet Take 1 tablet (40 mg total) by mouth daily. 90 tablet 3  . hydrALAZINE (APRESOLINE) 10 MG tablet Take 1 tablet (10 mg total) by mouth every 8 (eight) hours. 90 tablet 1  . nitroGLYCERIN (NITROSTAT) 0.4 MG SL tablet Place 1 tablet (0.4 mg total) under the tongue every 5 (five) minutes as needed for chest pain. 25 tablet 1  . tamsulosin (FLOMAX) 0.4 MG CAPS capsule Take 0.4 mg by mouth daily.    . metoprolol tartrate (LOPRESSOR) 25 MG tablet Take 0.5 tablets (12.5 mg total) by mouth 2 (two) times daily. 90 tablet 3   No current facility-administered medications for this visit.     No Known Allergies  Social History   Social History  . Marital status: Married    Spouse name: N/A  . Number of children: N/A  . Years of education: N/A   Occupational History  . retired [employer]   Social History Main Topics  . Smoking status: Never Smoker  . Smokeless tobacco: Never Used  . Alcohol use No  . Drug use: No  . Sexual activity: Not on file   Other Topics Concern  . Not on file   Social History Narrative  . No narrative on file    Family History  Problem Relation Age of Onset  .  Heart disease Mother        Further details not reported, died at age 29  . Valvular heart disease Father        H/o MV surgery, diet at age 47    Review of Systems:  As stated in the HPI and otherwise negative.   BP (!) 150/70   Pulse 63   Ht 6' 2.5" (1.892 m)   Wt 165 lb 3.2 oz (74.9 kg)   SpO2 95%   BMI 20.93 kg/m   Physical Examination:  General: Well developed, well nourished, NAD  HEENT: OP clear, mucus membranes moist  SKIN: warm, dry. No rashes. Neuro: No focal deficits  Musculoskeletal: Muscle strength 5/5 all ext  Psychiatric: Mood and affect normal  Neck: No JVD, no carotid bruits, no thyromegaly, no lymphadenopathy.  Lungs:Clear bilaterally, no wheezes, rhonci,  crackles Cardiovascular: Regular rate and rhythm. No murmurs, gallops or rubs. Abdomen:Soft. Bowel sounds present. Non-tender.  Extremities: No lower extremity edema. Pulses are 2 + in the bilateral DP/PT.  Echo 12/20/15: Left ventricle: The cavity size was normal. There was moderate   concentric hypertrophy. Systolic function was moderately reduced.   The estimated ejection fraction was 40%. Diffuse hypokinesis.   There is severe hypokinesis of the basal-midinferolateral,   inferior, and inferoseptal myocardium. - Ventricular septum: Septal motion showed moderate paradox. These   changes are consistent with intraventricular conduction delay. - Aortic valve: Poorly visualized. Trileaflet; normal thickness,   mildly calcified leaflets. - Mitral valve: There was mild regurgitation. - Left atrium: The atrium was severely dilated. - Right atrium: The atrium was mildly dilated. - Pulmonic valve: There was mild regurgitation. - Pulmonary arteries: PA peak pressure: 35 mm Hg (S).  EKG:  EKG is not  ordered today. The ekg ordered today demonstrates   Recent Labs: 12/22/2015: Magnesium 2.1 06/12/2016: BNP 65.2; Hemoglobin 14.5; Platelets 455 08/14/2016: TSH 1.720 08/22/2016: ALT 20; BUN 21; Creatinine, Ser 1.48; Potassium 3.9; Sodium 145   Lipid Panel    Component Value Date/Time   CHOL 130 12/20/2015 0325   TRIG 51 12/20/2015 0325   HDL 27 (L) 12/20/2015 0325   CHOLHDL 4.8 12/20/2015 0325   VLDL 10 12/20/2015 0325   LDLCALC 93 12/20/2015 0325     Wt Readings from Last 3 Encounters:  10/17/16 165 lb 3.2 oz (74.9 kg)  08/14/16 169 lb 2 oz (76.7 kg)  06/12/16 165 lb 12.8 oz (75.2 kg)     Other studies Reviewed: Additional studies/ records that were reviewed today include: . Review of the above records demonstrates:   Assessment and Plan:   1. Abnormal nuclear stress test: He had an abnormal stress test in 2017 but he was having no chest pain so cardiac cath was delayed at the  request of the patient.  No chest pain now. No plans for ischemic evaluation.   2. Chronic systolic CHF/cardiomyopathy: Weight stable on Lasix. Continue hydralazine and will restart beta blocker.   3. Atrial fib, paroxysmal: He is in sinus today. Amiodarone was stopped due to elevated LFTs in march 2018. Will restart metoprolol. Will continue Eliquis.     Current medicines are reviewed at length with the patient today.  The patient does not have concerns regarding medicines.  The following changes have been made:  no change  Labs/ tests ordered today include:  No orders of the defined types were placed in this encounter.    Disposition:   FU with me in 6 months   Signed, Harrell Gave  [name], MD 10/17/2016 11:15 AM    [address], [place], [place]  [address] Phone: [phone]; Fax: [phone]

## 2016-10-17 ENCOUNTER — Ambulatory Visit (INDEPENDENT_AMBULATORY_CARE_PROVIDER_SITE_OTHER): Payer: Medicare Other | Admitting: Cardiovascular Disease

## 2016-10-17 ENCOUNTER — Encounter: Payer: Self-pay | Admitting: Cardiovascular Disease

## 2016-10-17 VITALS — BP 150/70 | HR 63 | Ht 74.5 in | Wt 165.2 lb

## 2016-10-17 DIAGNOSIS — I5022 Chronic systolic (congestive) heart failure: Secondary | ICD-10-CM

## 2016-10-17 DIAGNOSIS — I480 Paroxysmal atrial fibrillation: Secondary | ICD-10-CM

## 2016-10-17 MED ORDER — FUROSEMIDE 40 MG PO TABS: 40.0000 mg | ORAL_TABLET | Freq: Every day | ORAL | 3 refills | Status: DC

## 2016-10-17 MED ORDER — METOPROLOL TARTRATE 25 MG PO TABS: 12.5000 mg | ORAL_TABLET | Freq: Two times a day (BID) | ORAL | 3 refills | Status: DC

## 2016-10-17 MED ORDER — APIXABAN 5 MG PO TABS: 5.0000 mg | ORAL_TABLET | Freq: Two times a day (BID) | ORAL | 1 refills | Status: DC

## 2016-10-17 NOTE — Patient Instructions (Signed)
Medication Instructions:  Your physician has recommended you make the following change in your medication:  Resume metoprolol tartrate 12.5 mg by mouth twice daily.   Labwork: none  Testing/Procedures: none  Follow-Up: Your physician recommends that you schedule a follow-up appointment in: 6 months. Please call our office in about 3 months to schedule this appointment.     Any Other Special Instructions Will Be Listed Below (If Applicable).     If you need a refill on your cardiac medications before your next appointment, please call your pharmacy.

## 2016-10-27 DIAGNOSIS — I10 Essential (primary) hypertension: Secondary | ICD-10-CM | POA: Diagnosis not present

## 2016-10-27 DIAGNOSIS — I4891 Unspecified atrial fibrillation: Secondary | ICD-10-CM | POA: Diagnosis not present

## 2016-10-27 DIAGNOSIS — N401 Enlarged prostate with lower urinary tract symptoms: Secondary | ICD-10-CM | POA: Diagnosis not present

## 2016-10-27 DIAGNOSIS — I5022 Chronic systolic (congestive) heart failure: Secondary | ICD-10-CM | POA: Diagnosis not present

## 2016-11-08 DIAGNOSIS — N401 Enlarged prostate with lower urinary tract symptoms: Secondary | ICD-10-CM | POA: Diagnosis not present

## 2016-11-08 DIAGNOSIS — R3914 Feeling of incomplete bladder emptying: Secondary | ICD-10-CM | POA: Diagnosis not present

## 2016-11-08 DIAGNOSIS — R350 Frequency of micturition: Secondary | ICD-10-CM | POA: Diagnosis not present

## 2016-11-09 DIAGNOSIS — L821 Other seborrheic keratosis: Secondary | ICD-10-CM | POA: Diagnosis not present

## 2016-11-09 DIAGNOSIS — L570 Actinic keratosis: Secondary | ICD-10-CM | POA: Diagnosis not present

## 2016-11-09 DIAGNOSIS — L578 Other skin changes due to chronic exposure to nonionizing radiation: Secondary | ICD-10-CM | POA: Diagnosis not present

## 2016-11-09 DIAGNOSIS — D225 Melanocytic nevi of trunk: Secondary | ICD-10-CM | POA: Diagnosis not present

## 2016-11-09 DIAGNOSIS — C44519 Basal cell carcinoma of skin of other part of trunk: Secondary | ICD-10-CM | POA: Diagnosis not present

## 2016-11-09 DIAGNOSIS — D485 Neoplasm of uncertain behavior of skin: Secondary | ICD-10-CM | POA: Diagnosis not present

## 2016-11-09 DIAGNOSIS — I8311 Varicose veins of right lower extremity with inflammation: Secondary | ICD-10-CM | POA: Diagnosis not present

## 2016-11-09 DIAGNOSIS — I8312 Varicose veins of left lower extremity with inflammation: Secondary | ICD-10-CM | POA: Diagnosis not present

## 2016-12-13 DIAGNOSIS — R634 Abnormal weight loss: Secondary | ICD-10-CM | POA: Diagnosis not present

## 2016-12-13 DIAGNOSIS — Z7901 Long term (current) use of anticoagulants: Secondary | ICD-10-CM | POA: Diagnosis not present

## 2016-12-13 DIAGNOSIS — I4891 Unspecified atrial fibrillation: Secondary | ICD-10-CM | POA: Diagnosis not present

## 2016-12-13 DIAGNOSIS — I5022 Chronic systolic (congestive) heart failure: Secondary | ICD-10-CM | POA: Diagnosis not present

## 2016-12-17 ENCOUNTER — Other Ambulatory Visit: Payer: Self-pay | Admitting: Internal Medicine

## 2016-12-17 DIAGNOSIS — R634 Abnormal weight loss: Secondary | ICD-10-CM

## 2016-12-18 ENCOUNTER — Emergency Department (HOSPITAL_COMMUNITY): Payer: Medicare Other

## 2016-12-18 ENCOUNTER — Inpatient Hospital Stay (HOSPITAL_COMMUNITY)
Admission: EM | Admit: 2016-12-18 | Discharge: 2016-12-22 | DRG: 682 | Disposition: A | Discharge status: Home Health | Payer: Medicare Other | Attending: Family Medicine | Admitting: Family Medicine

## 2016-12-18 ENCOUNTER — Encounter (HOSPITAL_COMMUNITY): Payer: Self-pay | Admitting: Nurse Practitioner

## 2016-12-18 DIAGNOSIS — R338 Other retention of urine: Secondary | ICD-10-CM | POA: Diagnosis not present

## 2016-12-18 DIAGNOSIS — R7989 Other specified abnormal findings of blood chemistry: Secondary | ICD-10-CM

## 2016-12-18 DIAGNOSIS — R531 Weakness: Secondary | ICD-10-CM

## 2016-12-18 DIAGNOSIS — R17 Unspecified jaundice: Secondary | ICD-10-CM | POA: Diagnosis present

## 2016-12-18 DIAGNOSIS — R9431 Abnormal electrocardiogram [ECG] [EKG]: Secondary | ICD-10-CM | POA: Diagnosis not present

## 2016-12-18 DIAGNOSIS — R339 Retention of urine, unspecified: Secondary | ICD-10-CM | POA: Diagnosis not present

## 2016-12-18 DIAGNOSIS — E875 Hyperkalemia: Secondary | ICD-10-CM | POA: Diagnosis present

## 2016-12-18 DIAGNOSIS — N179 Acute kidney failure, unspecified: Principal | ICD-10-CM | POA: Diagnosis present

## 2016-12-18 DIAGNOSIS — I10 Essential (primary) hypertension: Secondary | ICD-10-CM | POA: Diagnosis present

## 2016-12-18 DIAGNOSIS — Z9111 Patient's noncompliance with dietary regimen: Secondary | ICD-10-CM

## 2016-12-18 DIAGNOSIS — K219 Gastro-esophageal reflux disease without esophagitis: Secondary | ICD-10-CM | POA: Diagnosis present

## 2016-12-18 DIAGNOSIS — I5022 Chronic systolic (congestive) heart failure: Secondary | ICD-10-CM | POA: Diagnosis not present

## 2016-12-18 DIAGNOSIS — N390 Urinary tract infection, site not specified: Secondary | ICD-10-CM | POA: Diagnosis present

## 2016-12-18 DIAGNOSIS — J9811 Atelectasis: Secondary | ICD-10-CM | POA: Diagnosis present

## 2016-12-18 DIAGNOSIS — B952 Enterococcus as the cause of diseases classified elsewhere: Secondary | ICD-10-CM | POA: Diagnosis not present

## 2016-12-18 DIAGNOSIS — I361 Nonrheumatic tricuspid (valve) insufficiency: Secondary | ICD-10-CM | POA: Diagnosis not present

## 2016-12-18 DIAGNOSIS — N138 Other obstructive and reflux uropathy: Secondary | ICD-10-CM | POA: Diagnosis not present

## 2016-12-18 DIAGNOSIS — I482 Chronic atrial fibrillation: Secondary | ICD-10-CM | POA: Diagnosis not present

## 2016-12-18 DIAGNOSIS — M25572 Pain in left ankle and joints of left foot: Secondary | ICD-10-CM | POA: Diagnosis not present

## 2016-12-18 DIAGNOSIS — R7881 Bacteremia: Secondary | ICD-10-CM | POA: Diagnosis not present

## 2016-12-18 DIAGNOSIS — R634 Abnormal weight loss: Secondary | ICD-10-CM | POA: Diagnosis not present

## 2016-12-18 DIAGNOSIS — Z7901 Long term (current) use of anticoagulants: Secondary | ICD-10-CM

## 2016-12-18 DIAGNOSIS — Z681 Body mass index (BMI) 19 or less, adult: Secondary | ICD-10-CM | POA: Diagnosis not present

## 2016-12-18 DIAGNOSIS — E785 Hyperlipidemia, unspecified: Secondary | ICD-10-CM | POA: Diagnosis present

## 2016-12-18 DIAGNOSIS — R911 Solitary pulmonary nodule: Secondary | ICD-10-CM | POA: Diagnosis not present

## 2016-12-18 DIAGNOSIS — E43 Unspecified severe protein-calorie malnutrition: Secondary | ICD-10-CM | POA: Diagnosis present

## 2016-12-18 DIAGNOSIS — N183 Chronic kidney disease, stage 3 (moderate): Secondary | ICD-10-CM | POA: Diagnosis present

## 2016-12-18 DIAGNOSIS — I5042 Chronic combined systolic (congestive) and diastolic (congestive) heart failure: Secondary | ICD-10-CM | POA: Diagnosis present

## 2016-12-18 DIAGNOSIS — I480 Paroxysmal atrial fibrillation: Secondary | ICD-10-CM | POA: Diagnosis present

## 2016-12-18 DIAGNOSIS — Z79899 Other long term (current) drug therapy: Secondary | ICD-10-CM

## 2016-12-18 DIAGNOSIS — K5900 Constipation, unspecified: Secondary | ICD-10-CM | POA: Diagnosis present

## 2016-12-18 DIAGNOSIS — R404 Transient alteration of awareness: Secondary | ICD-10-CM | POA: Diagnosis not present

## 2016-12-18 DIAGNOSIS — I130 Hypertensive heart and chronic kidney disease with heart failure and stage 1 through stage 4 chronic kidney disease, or unspecified chronic kidney disease: Secondary | ICD-10-CM | POA: Diagnosis present

## 2016-12-18 DIAGNOSIS — Z91199 Patient's noncompliance with other medical treatment and regimen due to unspecified reason: Secondary | ICD-10-CM

## 2016-12-18 DIAGNOSIS — R778 Other specified abnormalities of plasma proteins: Secondary | ICD-10-CM | POA: Diagnosis present

## 2016-12-18 DIAGNOSIS — Z9119 Patient's noncompliance with other medical treatment and regimen: Secondary | ICD-10-CM | POA: Diagnosis not present

## 2016-12-18 DIAGNOSIS — N3000 Acute cystitis without hematuria: Secondary | ICD-10-CM | POA: Diagnosis not present

## 2016-12-18 DIAGNOSIS — I5040 Unspecified combined systolic (congestive) and diastolic (congestive) heart failure: Secondary | ICD-10-CM | POA: Diagnosis not present

## 2016-12-18 DIAGNOSIS — R748 Abnormal levels of other serum enzymes: Secondary | ICD-10-CM | POA: Diagnosis not present

## 2016-12-18 DIAGNOSIS — R4182 Altered mental status, unspecified: Secondary | ICD-10-CM | POA: Diagnosis not present

## 2016-12-18 LAB — URINALYSIS, ROUTINE W REFLEX MICROSCOPIC
BILIRUBIN URINE: NEGATIVE
Glucose, UA: NEGATIVE mg/dL
HGB URINE DIPSTICK: NEGATIVE
Ketones, ur: NEGATIVE mg/dL
NITRITE: NEGATIVE
PH: 5 (ref 5.0–8.0)
Protein, ur: NEGATIVE mg/dL
SPECIFIC GRAVITY, URINE: 1.009 (ref 1.005–1.030)
SQUAMOUS EPITHELIAL / LPF: NONE SEEN

## 2016-12-18 LAB — COMPREHENSIVE METABOLIC PANEL
ALBUMIN: 4.3 g/dL (ref 3.5–5.0)
ALK PHOS: 107 U/L (ref 38–126)
ALT: 14 U/L — AB (ref 17–63)
ANION GAP: 14 (ref 5–15)
AST: 16 U/L (ref 15–41)
BILIRUBIN TOTAL: 1.7 mg/dL — AB (ref 0.3–1.2)
BUN: 85 mg/dL — AB (ref 6–20)
CALCIUM: 10 mg/dL (ref 8.9–10.3)
CO2: 19 mmol/L — AB (ref 22–32)
CREATININE: 6.61 mg/dL — AB (ref 0.61–1.24)
Chloride: 111 mmol/L (ref 101–111)
GFR calc Af Amer: 8 mL/min — ABNORMAL LOW (ref >60)
GFR calc non Af Amer: 7 mL/min — ABNORMAL LOW (ref >60)
GLUCOSE: 145 mg/dL — AB (ref 65–99)
Potassium: 5.4 mmol/L — ABNORMAL HIGH (ref 3.5–5.1)
SODIUM: 144 mmol/L (ref 135–145)
TOTAL PROTEIN: 7.8 g/dL (ref 6.5–8.1)

## 2016-12-18 LAB — CBC
HCT: 39.5 % (ref 39.0–52.0)
Hemoglobin: 13.5 g/dL (ref 13.0–17.0)
MCH: 32 pg (ref 26.0–34.0)
MCHC: 34.2 g/dL (ref 30.0–36.0)
MCV: 93.6 fL (ref 78.0–100.0)
PLATELETS: 223 10*3/uL (ref 150–400)
RBC: 4.22 MIL/uL (ref 4.22–5.81)
RDW: 15.3 % (ref 11.5–15.5)
WBC: 19.9 10*3/uL — AB (ref 4.0–10.5)

## 2016-12-18 LAB — I-STAT CG4 LACTIC ACID, ED: Lactic Acid, Venous: 2.12 mmol/L (ref 0.5–1.9)

## 2016-12-18 LAB — TROPONIN I: Troponin I: 0.07 ng/mL (ref <0.03)

## 2016-12-18 MED ORDER — SODIUM CHLORIDE 0.9 % IV SOLN
Freq: Once | INTRAVENOUS | Status: AC
  Administered 2016-12-18: 17:00:00 via INTRAVENOUS

## 2016-12-18 MED ORDER — SODIUM POLYSTYRENE SULFONATE 15 GM/60ML PO SUSP
15.0000 g | Freq: Once | ORAL | Status: AC
Start: 2016-12-18 — End: 2016-12-18
  Administered 2016-12-18: 15 g via ORAL
  Filled 2016-12-18: qty 60

## 2016-12-18 MED ORDER — HYDRALAZINE HCL 10 MG PO TABS
10.0000 mg | ORAL_TABLET | Freq: Three times a day (TID) | ORAL | Status: DC
  Administered 2016-12-18 – 2016-12-22 (×12): 10 mg via ORAL
  Filled 2016-12-18 (×12): qty 1

## 2016-12-18 MED ORDER — METOPROLOL TARTRATE 5 MG/5ML IV SOLN
5.0000 mg | Freq: Once | INTRAVENOUS | Status: DC
  Filled 2016-12-18: qty 5

## 2016-12-18 MED ORDER — HEPARIN SODIUM (PORCINE) 5000 UNIT/ML IJ SOLN
5000.0000 [IU] | Freq: Three times a day (TID) | INTRAMUSCULAR | Status: DC
  Administered 2016-12-19 – 2016-12-22 (×10): 5000 [IU] via SUBCUTANEOUS
  Filled 2016-12-18 (×12): qty 1

## 2016-12-18 MED ORDER — SODIUM POLYSTYRENE SULFONATE 15 GM/60ML PO SUSP: 45.0000 g | Freq: Once | ORAL | Status: DC

## 2016-12-18 MED ORDER — NITROGLYCERIN 0.4 MG SL SUBL: 0.4000 mg | SUBLINGUAL_TABLET | SUBLINGUAL | Status: DC | PRN

## 2016-12-18 MED ORDER — DEXTROSE 5 % IV SOLN
1.0000 g | Freq: Once | INTRAVENOUS | Status: AC
  Administered 2016-12-18: 1 g via INTRAVENOUS
  Filled 2016-12-18: qty 10

## 2016-12-18 MED ORDER — METOPROLOL TARTRATE 25 MG PO TABS
12.5000 mg | ORAL_TABLET | Freq: Two times a day (BID) | ORAL | Status: DC
  Administered 2016-12-18 – 2016-12-22 (×8): 12.5 mg via ORAL
  Filled 2016-12-18 (×8): qty 1

## 2016-12-18 NOTE — ED Provider Notes (Signed)
[facility] DEPT Provider Note   CSN: [id number] Arrival date & time: 12/18/16  1541     History   Chief Complaint Chief Complaint  Patient presents with  . Weakness  . Anorexia    HPI Stephen Copeland is a 76 y.o. male.  HPI Patient reports that having problems with generalized weakness, falls and weight loss for at least 2 months. He reports he hasn't eaten anything for a long time. He reports if he tries to drink any fluids they always just a slight sugar to him so he doesn't want to drink anything. He is not had a fall within the past few days. Report when he falls his knees often buckle under him.The patient reports he thinks he is dehydrated. He reports his right hand shakes and is wondering if he has Parkinson's. He reports as been going on for a long time. Patient denies any focal area of pain.patient reports that he wears pull-up pants which have been a Therapist, sports for him. He reports he dribbles urine frequently. Past Medical History:  Diagnosis Date  . Acid reflux   . Folliculitis   . Hiatal hernia   . Hyperlipidemia    a. pt is adamantly against statins.  . Hypertension   . Ischemia    a. Pt states he was diagnosed with "ischemia" in the 1990s but does not know further details, denies hx of heart blockage.  . Nummular dermatitis   . Scoliosis   . Sinus bradycardia   . Stomach ulcer    a. remote ulcer in the 1990s, no hx of bleeding.    Patient Active Problem List   Diagnosis Date Noted  . UTI (urinary tract infection) 05/31/2016  . Acute urinary retention 05/31/2016  . Acute kidney injury superimposed on chronic kidney disease ([place]) 05/31/2016  . Generalized weakness 05/31/2016  . Falls 05/31/2016  . Acute systolic CHF (congestive heart failure) ([place])   . Congestive dilated cardiomyopathy ([place])   . Atrial fibrillation with RVR ([place])   . Uncontrolled hypertension   . Pulmonary hypertension ([place])   . Chronic systolic CHF  (congestive heart failure) ([place])   . Atrial fibrillation ([place]) 12/19/2015  . Hypertension 12/19/2015  . New onset a-fib ([place]) 12/19/2015  . Acute CHF ([place]) 12/19/2015  . Hyperglycemia 12/19/2015  . Acid reflux   . Hyperlipidemia   . Sinus bradycardia   . Gastroesophageal reflux disease   . Other hyperlipidemia   . Elevated troponin     History reviewed. No pertinent surgical history.     Home Medications    Prior to Admission medications   Medication Sig Start Date End Date Taking? Authorizing Provider  hydrALAZINE (APRESOLINE) 10 MG tablet Take 1 tablet (10 mg total) by mouth every 8 (eight) hours. 08/14/16 12/18/16 Yes Simmons, Brittainy M, PA-C  metoprolol tartrate (LOPRESSOR) 25 MG tablet Take 0.5 tablets (12.5 mg total) by mouth 2 (two) times daily. 10/17/16 01/15/17 Yes Burnell Blanks, MD  apixaban (ELIQUIS) 5 MG TABS tablet Take 1 tablet (5 mg total) by mouth 2 (two) times daily. Patient not taking: Reported on 12/18/2016 10/17/16   Burnell Blanks, MD  furosemide (LASIX) 40 MG tablet Take 1 tablet (40 mg total) by mouth daily. Patient not taking: Reported on 12/18/2016 10/17/16   Burnell Blanks, MD  nitroGLYCERIN (NITROSTAT) 0.4 MG SL tablet Place 1 tablet (0.4 mg total) under the tongue every 5 (five) minutes as needed for chest pain. 02/20/16   Burnell Blanks,  MD    Family History Family History  Problem Relation Age of Onset  . Heart disease Mother        Further details not reported, died at age 33  . Valvular heart disease Father        H/o MV surgery, diet at age 2    Social History Social History  Substance Use Topics  . Smoking status: Never Smoker  . Smokeless tobacco: Never Used  . Alcohol use No     Allergies   Patient has no known allergies.   Review of Systems Review of Systems  10 Systems reviewed and are negative for acute change except as noted in the HPI.  Physical Exam Updated Vital Signs BP (!)  165/81 (BP Location: Left Arm)   Pulse 72   Temp 98.1 F (36.7 C) (Oral)   Resp 20   SpO2 99%   Physical Exam  Constitutional: He is oriented to person, place, and time. He appears well-developed and well-nourished.  Patient is alert. He is talkative. No respiratory distress. Nontoxic.Patient is thin but not cachectic.  HENT:  Head: Normocephalic and atraumatic.  Lips are slightly dry but mucous membranes appear moist. Posterior oropharynx is widely patent. Dentition good to fair  Eyes: Pupils are equal, round, and reactive to light. Conjunctivae and EOM are normal.  Neck: Neck supple.  Cardiovascular: Normal rate and regular rhythm.   No murmur heard. Distant heart sounds. distal pulses 2+  Pulmonary/Chest: Effort normal and breath sounds normal. No respiratory distress.  Abdominal: Soft. He exhibits no distension. There is tenderness. There is no guarding.  Patient endorses slight suprapubic discomfort to palpation. He reports makes him feel like he has to urinate. Patient feels firm and suprapubic region.  Musculoskeletal: He exhibits no edema, tenderness or deformity.  Feet and lower legs are in good condition. Patient has some bunions and distorted toes but no signs of infection or acute injuries. No peripheral edema. No cellulitis. I put the patient's lower extremities through full range of motion with deep flexion at the hip and having the patient push against resistance which he does symmetrically.  Neurological: He is alert and oriented to person, place, and time. No cranial nerve deficit or sensory deficit. He exhibits normal muscle tone. Coordination normal.  Patient's mental status is alert and interactive. He follows commands appropriately. No cogwheeling of upper extremities. No gross tremor except for when I came in the room patient was illustrating tremoring of the left hand that he was holding out. Once he had shown it to me, there was no further tremor.  Skin: Skin is warm  and dry.  Psychiatric: He has a normal mood and affect.  Nursing note and vitals reviewed.    ED Treatments / Results  Labs (all labs ordered are listed, but only abnormal results are displayed) Labs Reviewed  CBC - Abnormal; Notable for the following:       Result Value   WBC 19.9 (*)    All other components within normal limits  URINALYSIS, ROUTINE W REFLEX MICROSCOPIC - Abnormal; Notable for the following:    Leukocytes, UA SMALL (*)    Bacteria, UA MANY (*)    All other components within normal limits  COMPREHENSIVE METABOLIC PANEL - Abnormal; Notable for the following:    Potassium 5.4 (*)    CO2 19 (*)    Glucose, Bld 145 (*)    BUN 85 (*)    Creatinine, Ser 6.61 (*)    ALT  14 (*)    Total Bilirubin 1.7 (*)    GFR calc non Af Amer 7 (*)    GFR calc Af Amer 8 (*)    All other components within normal limits  TROPONIN I - Abnormal; Notable for the following:    Troponin I 0.07 (*)    All other components within normal limits  I-STAT CG4 LACTIC ACID, ED - Abnormal; Notable for the following:    Lactic Acid, Venous 2.12 (*)    All other components within normal limits  CULTURE, BLOOD (ROUTINE X 2)  CULTURE, BLOOD (ROUTINE X 2)  URINE CULTURE    EKG  EKG Interpretation  Date/Time:  Tuesday December 18 2016 16:00:13 EDT Ventricular Rate:  66 PR Interval:    QRS Duration: 159 QT Interval:  442 QTC Calculation: 464 R Axis:   -73 Text Interpretation:  Sinus rhythm Left bundle branch block old left bundle branch block. No change from previous. Confirmed by Charlesetta Shanks [phone]) on 12/18/2016 4:18:41 PM Also confirmed by Charlesetta Shanks [phone]), editor Philomena Doheny [phone])  on 12/18/2016 4:38:52 PM       Radiology Dg Chest 2 View  Result Date: 12/18/2016 CLINICAL DATA:  Weight loss and fatigue EXAM: CHEST  2 VIEW COMPARISON:  June 12, 2016 FINDINGS: On the frontal view, there is a 2 x 2 cm nodular opacity in the right lower lobe region. There is atelectatic  change in the left base. The lungs elsewhere clear. Heart is mildly enlarged with pulmonary vascularity within normal limits. No evident adenopathy. There is aortic atherosclerosis. No evident bone lesions. IMPRESSION: 2 x 2 cm nodular opacity right lower lobe medially seen only on the frontal view. Advise noncontrast enhanced chest CT to further assess. Atelectasis left base.  No edema or consolidation. Stable cardiac silhouette.  There is aortic atherosclerosis. Aortic Atherosclerosis (ICD10-I70.0). Electronically Signed   By: Lowella Grip III M.D.   On: 12/18/2016 17:16    Procedures Procedures (including critical care time)  Medications Ordered in ED Medications  cefTRIAXone (ROCEPHIN) 1 g in dextrose 5 % 50 mL IVPB (not administered)  0.9 %  sodium chloride infusion ( Intravenous New Bag/Given 12/18/16 1649)     Initial Impression / Assessment and Plan / ED Course  I have reviewed the triage vital signs and the nursing notes.  Pertinent labs & imaging results that were available during my care of the patient were reviewed by me and considered in my medical decision making (see chart for details).    Consult: [id number] urology on-call Dr. Lovena Neighbours. Reviewed the patient's history of present illness and his current refusal to accept Foley catheter placement. Admit the patient and consult will be done by urology this evening.  Final Clinical Impressions(s) / ED Diagnoses   Final diagnoses:  Acute kidney injury ([place])  Urinary retention  Acute cystitis without hematuria   Patient presents with generalized weakness and feeling of dehydration. Notably he talked about his constant dribbling of urine and what a life saver his depends pull-ups are. Patient has significant urinary retention and obstructive uropathy with significant elevation in BUN and creatinine relative to baseline. At this time, patient is refusing placement of catheter. Patient is alert and situationally oriented. He states  his reason for refusing this is because when he had one before it never solve his problems are emptied his bladder and after it came out he had the constant dribbling of urine. When I described to the patient that allowing this to go  along with out treatment could result in his death, patient replies "well you got ago somehow." To this point he has been adamant about not accepting a catheter despite explanation of how effective it will be in treating his condition. He insists that the prostate medicine should be working. I have spoken the patient's wife on the telephone, she reports that he is extremely stubborn. She is going to have a phone conversation with him to see if she can urge him to proceed with necessary treatment.  Dr. Lovena Neighbours of urology came to the emergency department and the patient continued to refuse catheter placement. Together at bedside I reiterated the possibility of permanent renal failure necessitating dialysis or possibly sudden death due to hyperkalemia. Patient does not seem to exhibit lack of understanding, he went into detailed, anatomic descriptions of a hiatal hernia and inguinal hernias which he has had. He advised he would not be rushed into this decision. No amount of reassurances that Dr. Lovena Neighbours had performed many catheters and indeed, was willing to discuss placing a suprapubic catheter if the patient would agree with convince the patient to allow further treatment of urinary retention at this time. Patient is not refusing hospitalization her ongoing treatment. New Prescriptions New Prescriptions   No medications on file     Charlesetta Shanks, MD 12/18/16 [phone]

## 2016-12-18 NOTE — ED Notes (Signed)
Bladder scan 823ml post void

## 2016-12-18 NOTE — ED Triage Notes (Signed)
Pt states for the last 2 months he has been struggling with health issues. Today he states he woke up feeling weak and had an episode of emesis. He states he has lost appetite and everything tastes like sugar even though he is not diabetic. C/o intermittent abdominal pain, denies chest pain or shortness of breath.

## 2016-12-18 NOTE — ED Notes (Signed)
Pt has been informed that a foley catheter is indicated to drain the 865ml bladder scan. He has declined stating although he is in pain, he would rather not have the catheter. The EDP and the urologist ar at bedside are helping the patient understand why it is critical to have the bladder drained. Pt seems to understand the risk with his kidneys.

## 2016-12-18 NOTE — Consult Note (Signed)
Urology Consult   Physician requesting consult: Dr. Johnney Killian  Reason for consult: Acute urinary retention with acute renal failure.  Patient refusing Foley catheterization.  History of Present Illness: Stephen Copeland is a 76 y.o. male who is currently being evaluated in the emergency department for acute renal failure secondary to bladder outlet obstruction.  The patient states that over the past 2 months he has become progressively weaker and has had a poor appetite.  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  He had a bladder scan performed in the ED that showed greater than 800 mL  inhis bladder.  He is voiding small amounts (200 mL) but still has elevated post void residuals.  The patient adamantly refuses a Foley catheter despite Dr. Vallery Ridge and myself explaining the rationale and absolute necessity for bladder decompression in this setting.    .  Past Medical History:  Diagnosis Date  . Acid reflux   . Folliculitis   . Hiatal hernia   . Hyperlipidemia    a. pt is adamantly against statins.  . Hypertension   . Ischemia    a. Pt states he was diagnosed with "ischemia" in the 1990s but does not know further details, denies hx of heart blockage.  . Nummular dermatitis   . Scoliosis   . Sinus bradycardia   . Stomach ulcer    a. remote ulcer in the 1990s, no hx of bleeding.    History reviewed. No pertinent surgical history.  Current Hospital Medications:  Home Meds:  Current Meds  Medication Sig  . hydrALAZINE (APRESOLINE) 10 MG tablet Take 1 tablet (10 mg total) by mouth every 8 (eight) hours.  . metoprolol tartrate (LOPRESSOR) 25 MG tablet Take 0.5 tablets (12.5 mg total) by mouth 2 (two) times daily.    Scheduled Meds: . sodium polystyrene  45 g Oral Once   Continuous Infusions: . cefTRIAXone (ROCEPHIN)  IV 1 g (12/18/16 1934)   PRN Meds:.  Allergies: No Known Allergies  Family History   Problem Relation Age of Onset  . Heart disease Mother        Further details not reported, died at age 19  . Valvular heart disease Father        H/o MV surgery, diet at age 42    Social History:  reports that he has never smoked. He has never used smokeless tobacco. He reports that he does not drink alcohol or use drugs.  ROS: A complete review of systems was performed.  All systems are negative except for pertinent findings as noted.  Physical Exam:  Vital signs in last 24 hours: Temp:  [98.1 F (36.7 C)] 98.1 F (36.7 C) (10/16 1601) Pulse Rate:  [71-72] 71 (10/16 1907) Resp:  [18-20] 18 (10/16 1907) BP: (161-165)/(81-86) 161/86 (10/16 1907) SpO2:  [99 %-100 %] 100 % (10/16 1907) Constitutional:  Alert and oriented, No acute distress Cardiovascular: Regular rate and rhythm, No JVD Respiratory: Normal respiratory effort, Lungs clear bilaterally GI: Abdomen is soft, nontender, nondistended, no abdominal masses GU: No CVA tenderness Lymphatic: No lymphadenopathy Neurologic: Grossly intact, no focal deficits Psychiatric: Normal mood and affect  Laboratory Data:   Recent Labs  12/18/16 1615  WBC 19.9*  HGB 13.5  HCT 39.5  PLT 223     Recent Labs  12/18/16 1615  NA 144  K 5.4*  CL 111  GLUCOSE 145*  BUN 85*  CALCIUM 10.0  CREATININE 6.61*  Results for orders placed or performed during the hospital encounter of 12/18/16 (from the past 24 hour(s))  CBC     Status: Abnormal   Collection Time: 12/18/16  4:15 PM  Result Value Ref Range   WBC 19.9 (H) 4.0 - 10.5 K/uL   RBC 4.22 4.22 - 5.81 MIL/uL   Hemoglobin 13.5 13.0 - 17.0 g/dL   HCT 39.5 39.0 - 52.0 %   MCV 93.6 78.0 - 100.0 fL   MCH 32.0 26.0 - 34.0 pg   MCHC 34.2 30.0 - 36.0 g/dL   RDW 15.3 11.5 - 15.5 %   Platelets 223 150 - 400 K/uL  Comprehensive metabolic panel     Status: Abnormal   Collection Time: 12/18/16  4:15 PM  Result Value Ref Range   Sodium 144 135 - 145 mmol/L   Potassium 5.4  (H) 3.5 - 5.1 mmol/L   Chloride 111 101 - 111 mmol/L   CO2 19 (L) 22 - 32 mmol/L   Glucose, Bld 145 (H) 65 - 99 mg/dL   BUN 85 (H) 6 - 20 mg/dL   Creatinine, Ser 6.61 (H) 0.61 - 1.24 mg/dL   Calcium 10.0 8.9 - 10.3 mg/dL   Total Protein 7.8 6.5 - 8.1 g/dL   Albumin 4.3 3.5 - 5.0 g/dL   AST 16 15 - 41 U/L   ALT 14 (L) 17 - 63 U/L   Alkaline Phosphatase 107 38 - 126 U/L   Total Bilirubin 1.7 (H) 0.3 - 1.2 mg/dL   GFR calc non Af Amer 7 (L) >60 mL/min   GFR calc Af Amer 8 (L) >60 mL/min   Anion gap 14 5 - 15  Troponin I     Status: Abnormal   Collection Time: 12/18/16  4:15 PM  Result Value Ref Range   Troponin I 0.07 (HH) <0.03 ng/mL  Urinalysis, Routine w reflex microscopic     Status: Abnormal   Collection Time: 12/18/16  4:27 PM  Result Value Ref Range   Color, Urine YELLOW YELLOW   APPearance CLEAR CLEAR   Specific Gravity, Urine 1.009 1.005 - 1.030   pH 5.0 5.0 - 8.0   Glucose, UA NEGATIVE NEGATIVE mg/dL   Hgb urine dipstick NEGATIVE NEGATIVE   Bilirubin Urine NEGATIVE NEGATIVE   Ketones, ur NEGATIVE NEGATIVE mg/dL   Protein, ur NEGATIVE NEGATIVE mg/dL   Nitrite NEGATIVE NEGATIVE   Leukocytes, UA SMALL (A) NEGATIVE   RBC / HPF 0-5 0 - 5 RBC/hpf   WBC, UA 6-30 0 - 5 WBC/hpf   Bacteria, UA MANY (A) NONE SEEN   Squamous Epithelial / LPF NONE SEEN NONE SEEN   Mucus PRESENT   I-Stat CG4 Lactic Acid, ED     Status: Abnormal   Collection Time: 12/18/16  6:15 PM  Result Value Ref Range   Lactic Acid, Venous 2.12 (HH) 0.5 - 1.9 mmol/L   Comment NOTIFIED PHYSICIAN    No results found for this or any previous visit (from the past 240 hour(s)).  Renal Function:  Recent Labs  12/18/16 1615  CREATININE 6.61*   CrCl cannot be calculated (Unknown ideal weight.).  Radiologic Imaging: Dg Chest 2 View  Result Date: 12/18/2016 CLINICAL DATA:  Weight loss and fatigue EXAM: CHEST  2 VIEW COMPARISON:  June 12, 2016 FINDINGS: On the frontal view, there is a 2 x 2 cm nodular  opacity in the right lower lobe region. There is atelectatic change in the left base. The lungs elsewhere clear. Heart is  mildly enlarged with pulmonary vascularity within normal limits. No evident adenopathy. There is aortic atherosclerosis. No evident bone lesions. IMPRESSION: 2 x 2 cm nodular opacity right lower lobe medially seen only on the frontal view. Advise noncontrast enhanced chest CT to further assess. Atelectasis left base.  No edema or consolidation. Stable cardiac silhouette.  There is aortic atherosclerosis. Aortic Atherosclerosis (ICD10-I70.0). Electronically Signed   By: Lowella Grip III M.D.   On: 12/18/2016 17:16    I independently reviewed the above imaging studies.  Impression/Recommendation  Acute on chronic renal failure-- Baseline creatinine-1.4 Hyperkalemia Bladder outlet obstruction Acute urinary retention History of BPH with lower urinary tract symptoms  -The patient adamantly refuses to have a Foley catheter placed in the emergency department out of concern for catheter discomfort and possible worsening of his voiding symptoms.  Despite Dr. Vallery Ridge and I pleading with the patient and explaining the ramifications of not decompressing his bladder in the setting of acute renal failure and hyperkalemia, he still refuses to have the necessary treatment.    Conception Oms Cherese Lozano, MD 12/18/2016, 7:59 PM

## 2016-12-18 NOTE — ED Notes (Signed)
RN notified of abnormal lab 

## 2016-12-18 NOTE — H&P (Signed)
History and Physical    Stephen Copeland ZOX:[id number] DOB: 04/01/1940 DOA: 12/18/2016  PCP: Patient, No Pcp Per   Patient coming from: Home.  I have personally briefly reviewed patient's old medical records in [place]  Chief Complaint: abdominal pain.  HPI: Stephen Copeland is a 76 y.o. male with medical history significant of GERD, hiatal hernia, folliculitis, hyperlipidemia, hypertension,  Nummular dermatitis, scoliosis, sinus bradycardia who is coming in to the emergency department with complaints of 2 months of not feeling well, intermittent abdominal pain, loss of appetite, weight loss, change in taste ( everything tastes like sugar ), states that he woke up very weak this morning and this was followed by an episode of emesis. He denies fever, chest pain, palpitations, dizziness, diaphoresis, PND, orthopnea, pitting edema lower extremities, diarrhea, melena or hematochezia. However, he complains of being constipated.He denies dysuria, but complains that is very difficult to initiate urination.  ED Course: Initial vital signs in the emergency department temperature 98.91F, pulse 72, respirations 20, blood pressure 165/63mmHg and O2 sat 99% on room air. His workup shows urinalysis with pyuria and bacteriuria. Urology was consulted. He declined Foley catheter placement.  WBC was 19.9, hemoglobin 13.5 g/dL and platelets 223. Lactic acid 2.12, sodium 144, potassium 5.4, chloride 111 and bicarbonate 19 mmol/L. Bilirubin 1.7, BUN was 85, creatinine 6.61 and glucose 145 mg/dL. His chest radiograph shows a 2 x 2 centimeter nodular opacity in the right lower lobe seen only on the frontal view. Left base atelectasis. Please see imaging and all radiology report for further detail.   Review of Systems: As per HPI otherwise 10 point review of systems negative.    Past Medical History:  Diagnosis Date  . Acid reflux   . Folliculitis   . Hiatal hernia   . Hyperlipidemia    a. pt is adamantly  against statins.  . Hypertension   . Ischemia    a. Pt states he was diagnosed with "ischemia" in the 1990s but does not know further details, denies hx of heart blockage.  . Nummular dermatitis   . Scoliosis   . Sinus bradycardia   . Stomach ulcer    a. remote ulcer in the 1990s, no hx of bleeding.    History reviewed. No pertinent surgical history.   reports that he has never smoked. He has never used smokeless tobacco. He reports that he does not drink alcohol or use drugs.  No Known Allergies  Family History  Problem Relation Age of Onset  . Heart disease Mother        Further details not reported, died at age 74  . Valvular heart disease Father        H/o MV surgery, diet at age 27    Prior to Admission medications   Medication Sig Start Date End Date Taking? Authorizing Provider  hydrALAZINE (APRESOLINE) 10 MG tablet Take 1 tablet (10 mg total) by mouth every 8 (eight) hours. 08/14/16 12/18/16 Yes Simmons, Brittainy M, PA-C  metoprolol tartrate (LOPRESSOR) 25 MG tablet Take 0.5 tablets (12.5 mg total) by mouth 2 (two) times daily. 10/17/16 01/15/17 Yes Burnell Blanks, MD  apixaban (ELIQUIS) 5 MG TABS tablet Take 1 tablet (5 mg total) by mouth 2 (two) times daily. Patient not taking: Reported on 12/18/2016 10/17/16   Burnell Blanks, MD  furosemide (LASIX) 40 MG tablet Take 1 tablet (40 mg total) by mouth daily. Patient not taking: Reported on 12/18/2016 10/17/16   Burnell Blanks, MD  nitroGLYCERIN (  NITROSTAT) 0.4 MG SL tablet Place 1 tablet (0.4 mg total) under the tongue every 5 (five) minutes as needed for chest pain. 02/20/16   Burnell Blanks, MD    Physical Exam: Vitals:   12/18/16 1601 12/18/16 1907  BP: (!) 165/81 (!) 161/86  Pulse: 72 71  Resp: 20 18  Temp: 98.1 F (36.7 C)   TempSrc: Oral   SpO2: 99% 100%    Constitutional: NAD, calm, comfortable Eyes: PERRL, lids and conjunctivae normal ENMT: lips are dry. Mucous  membranes are moist. Posterior pharynx clear of any exudate or lesions. Neck: normal, supple, no masses, no thyromegaly Respiratory: decreased breath sounds on bases, otherwiseclear to auscultation bilaterally, no wheezing, no crackles. Normal respiratory effort. No accessory muscle use.  Cardiovascular: Regular rate and rhythm, no murmurs / rubs / gallops. No extremity edema. 2+ pedal pulses. No carotid bruits.  Abdomen: Bowel sounds positive. Soft, positive suprapubic tenderness, no guarding/masses palpated. No hepatosplenomegaly. No CVA tenderness. Musculoskeletal: no clubbing / cyanosis. Positive post deformity. Good ROM, no contractures. Normal muscle tone.  Skin: small ecchymosis areas on arms, otherwise no significant skin lesions on limited skin exam.  Neurologic: CN 2-12 grossly intact. Sensation intact, DTR normal. Strength 5/5 in all 4.  Psychiatric: Alert and oriented x 3. Pleasant. Very talkative and easily distracted to non-related topics (like my ethnicity, my place of birth, his experiences in the [employer], etc.,).    Labs on Admission: I have personally reviewed following labs and imaging studies  CBC:  Recent Labs Lab 12/18/16 1615  WBC 19.9*  HGB 13.5  HCT 39.5  MCV 93.6  PLT 426   Basic Metabolic Panel:  Recent Labs Lab 12/18/16 1615  NA 144  K 5.4*  CL 111  CO2 19*  GLUCOSE 145*  BUN 85*  CREATININE 6.61*  CALCIUM 10.0   GFR: CrCl cannot be calculated (Unknown ideal weight.). Liver Function Tests:  Recent Labs Lab 12/18/16 1615  AST 16  ALT 14*  ALKPHOS 107  BILITOT 1.7*  PROT 7.8  ALBUMIN 4.3   No results for input(s): LIPASE, AMYLASE in the last 168 hours. No results for input(s): AMMONIA in the last 168 hours. Coagulation Profile: No results for input(s): INR, PROTIME in the last 168 hours. Cardiac Enzymes:  Recent Labs Lab 12/18/16 1615  TROPONINI 0.07*   BNP (last 3 results) No results for input(s): PROBNP in the last 8760  hours. HbA1C: No results for input(s): HGBA1C in the last 72 hours. CBG: No results for input(s): GLUCAP in the last 168 hours. Lipid Profile: No results for input(s): CHOL, HDL, LDLCALC, TRIG, CHOLHDL, LDLDIRECT in the last 72 hours. Thyroid Function Tests: No results for input(s): TSH, T4TOTAL, FREET4, T3FREE, THYROIDAB in the last 72 hours. Anemia Panel: No results for input(s): VITAMINB12, FOLATE, FERRITIN, TIBC, IRON, RETICCTPCT in the last 72 hours. Urine analysis:    Component Value Date/Time   COLORURINE YELLOW 12/18/2016 1627   APPEARANCEUR CLEAR 12/18/2016 1627   LABSPEC 1.009 12/18/2016 1627   PHURINE 5.0 12/18/2016 1627   GLUCOSEU NEGATIVE 12/18/2016 1627   HGBUR NEGATIVE 12/18/2016 [place] 12/18/2016 1627   BILIRUBINUR negative 06/12/2016 1146   [place] 12/18/2016 1627   PROTEINUR NEGATIVE 12/18/2016 1627   UROBILINOGEN 1.0 06/12/2016 1146   NITRITE NEGATIVE 12/18/2016 1627   LEUKOCYTESUR SMALL (A) 12/18/2016 1627    Radiological Exams on Admission: Dg Chest 2 View  Result Date: 12/18/2016 CLINICAL DATA:  Weight loss and fatigue EXAM: CHEST  2 VIEW COMPARISON:  June 12, 2016 FINDINGS: On the frontal view, there is a 2 x 2 cm nodular opacity in the right lower lobe region. There is atelectatic change in the left base. The lungs elsewhere clear. Heart is mildly enlarged with pulmonary vascularity within normal limits. No evident adenopathy. There is aortic atherosclerosis. No evident bone lesions. IMPRESSION: 2 x 2 cm nodular opacity right lower lobe medially seen only on the frontal view. Advise noncontrast enhanced chest CT to further assess. Atelectasis left base.  No edema or consolidation. Stable cardiac silhouette.  There is aortic atherosclerosis. Aortic Atherosclerosis (ICD10-I70.0). Electronically Signed   By: Lowella Grip III M.D.   On: 12/18/2016 17:16   06/01/2016  echo  ------------------------------------------------------------------- LV EF: 35% -   40%  ------------------------------------------------------------------- Indications:      CHF - 428.0.  ------------------------------------------------------------------- History:   PMH:   Atrial fibrillation.  Risk factors: Hypertension.  ------------------------------------------------------------------- Study Conclusions  - Left ventricle: The cavity size was normal. There was severe   concentric hypertrophy. Systolic function was moderately reduced.   The estimated ejection fraction was in the range of 35% to 40%.   Diffuse hypokinesis. Features are consistent with a pseudonormal   left ventricular filling pattern, with concomitant abnormal   relaxation and increased filling pressure (grade 2 diastolic   dysfunction). Doppler parameters are consistent with elevated   ventricular end-diastolic filling pressure. - Mitral valve: There was mild regurgitation. - Left atrium: The atrium was moderately dilated. - Right atrium: The atrium was moderately dilated. - Tricuspid valve: There was mild regurgitation. - Pulmonic valve: There was mild regurgitation. - Pulmonary arteries: Systolic pressure was moderately increased.   PA peak pressure: 43 mm Hg (S). - Inferior vena cava: The vessel was normal in size. - Pericardium, extracardiac: There was no pericardial effusion.  Impressions:  - When compared to the prior study on 12/20/2015 there is no   significant difference.  EKG: Independently reviewed. Vent. rate 66 BPM PR interval * ms QRS duration 159 ms QT/QTc 442/464 ms P-R-T axes 95 -73 104 Sinus rhythm Left bundle branch block  Assessment/Plan Principal Problem:   Acute renal failure (ARF) (HCC) Seems to be due to obstructive uropathy. Admit to telemetry/inpatient. Has only being able to urinate minimally He declined Foley catheter insertion by urology  earlier. Declined trial of Flomax and Proscar. Declined further Lab work for now. Monitor intake and output. Follow-up renal function and electrolytes in the morning.  Active Problems:   Noncompliance with treatment plan The patient declined Foley catheter insertion by urology earlier. Declined troponin and lactic acid level follow-up earlier, Declined trial of Flomax and Proscar.    Atrial fibrillation (HCC) CHA?DS?-VASc Score of at least 4. Declined to continue anticoagulation due to the belief that the [facility] is causing nightmares. Continue metoprolol for rate control.    Hypertension Continue hydralazine 10 mg by mouth every 8 hours. Continue metoprolol 12.5 mg by mouth twice a day.    Elevated troponin Will try to get follow-up in the morning.    Chronic systolic CHF (congestive heart failure) (HCC) Continue metoprolol and hydralazine.    Generalized weakness Explained to the patient that his weakness, lack of appetite and taste changes are being caused most likely by uremia. I encouraged him to accept Foley catheterization, but the patient continues to decline.    Hyperkalemia No EKG changes. Kayexalate 15 g orally 1 dose. Follow-up potassium level in the morning.    Hyperbilirubinemia Follow-up liver function tests  in the morning.    Right lower lobe pulmonary nodule Get CT of the chest without contrast. The patient allows in the morning.    DVT prophylaxis: Heparin SQ. Code Status: Full code. Family Communication:  Disposition Plan: admitted for urology evaluation, further workup and treatment. Consults called: urology Harrell Gave Lovena Neighbours, M.D.). Admission status: Inpatient/telemetry.   Reubin Milan MD [facility] Pager [phone].  If 7PM-7AM, please contact night-coverage [email or web address] Password [email or web address]  12/18/2016, 7:14 PM

## 2016-12-18 NOTE — ED Notes (Signed)
Call report to [place], [place]

## 2016-12-18 NOTE — ED Notes (Signed)
Bed: XA12 Expected date:  Expected time:  Means of arrival:  Comments: Pt still in the room

## 2016-12-19 DIAGNOSIS — I10 Essential (primary) hypertension: Secondary | ICD-10-CM

## 2016-12-19 DIAGNOSIS — I5022 Chronic systolic (congestive) heart failure: Secondary | ICD-10-CM

## 2016-12-19 DIAGNOSIS — R531 Weakness: Secondary | ICD-10-CM

## 2016-12-19 DIAGNOSIS — B952 Enterococcus as the cause of diseases classified elsewhere: Secondary | ICD-10-CM | POA: Diagnosis present

## 2016-12-19 DIAGNOSIS — R748 Abnormal levels of other serum enzymes: Secondary | ICD-10-CM

## 2016-12-19 DIAGNOSIS — N390 Urinary tract infection, site not specified: Secondary | ICD-10-CM | POA: Diagnosis present

## 2016-12-19 DIAGNOSIS — N179 Acute kidney failure, unspecified: Principal | ICD-10-CM

## 2016-12-19 DIAGNOSIS — Z9111 Patient's noncompliance with dietary regimen: Secondary | ICD-10-CM

## 2016-12-19 DIAGNOSIS — I482 Chronic atrial fibrillation: Secondary | ICD-10-CM

## 2016-12-19 DIAGNOSIS — R339 Retention of urine, unspecified: Secondary | ICD-10-CM

## 2016-12-19 DIAGNOSIS — R7881 Bacteremia: Secondary | ICD-10-CM | POA: Diagnosis present

## 2016-12-19 DIAGNOSIS — R911 Solitary pulmonary nodule: Secondary | ICD-10-CM | POA: Diagnosis present

## 2016-12-19 DIAGNOSIS — Z91199 Patient's noncompliance with other medical treatment and regimen due to unspecified reason: Secondary | ICD-10-CM

## 2016-12-19 LAB — CBC
HEMATOCRIT: 33.3 % — AB (ref 39.0–52.0)
HEMOGLOBIN: 11.1 g/dL — AB (ref 13.0–17.0)
MCH: 30.9 pg (ref 26.0–34.0)
MCHC: 33.3 g/dL (ref 30.0–36.0)
MCV: 92.8 fL (ref 78.0–100.0)
Platelets: 189 10*3/uL (ref 150–400)
RBC: 3.59 MIL/uL — ABNORMAL LOW (ref 4.22–5.81)
RDW: 15.4 % (ref 11.5–15.5)
WBC: 17.9 10*3/uL — ABNORMAL HIGH (ref 4.0–10.5)

## 2016-12-19 LAB — COMPREHENSIVE METABOLIC PANEL
ALBUMIN: 3.5 g/dL (ref 3.5–5.0)
ALT: 11 U/L — ABNORMAL LOW (ref 17–63)
ANION GAP: 13 (ref 5–15)
AST: 12 U/L — ABNORMAL LOW (ref 15–41)
Alkaline Phosphatase: 87 U/L (ref 38–126)
BILIRUBIN TOTAL: 1.5 mg/dL — AB (ref 0.3–1.2)
BUN: 82 mg/dL — ABNORMAL HIGH (ref 6–20)
CHLORIDE: 109 mmol/L (ref 101–111)
CO2: 19 mmol/L — AB (ref 22–32)
Calcium: 9 mg/dL (ref 8.9–10.3)
Creatinine, Ser: 6.33 mg/dL — ABNORMAL HIGH (ref 0.61–1.24)
GFR calc Af Amer: 9 mL/min — ABNORMAL LOW (ref >60)
GFR calc non Af Amer: 8 mL/min — ABNORMAL LOW (ref >60)
GLUCOSE: 136 mg/dL — AB (ref 65–99)
POTASSIUM: 4.8 mmol/L (ref 3.5–5.1)
SODIUM: 141 mmol/L (ref 135–145)
TOTAL PROTEIN: 6.8 g/dL (ref 6.5–8.1)

## 2016-12-19 LAB — BLOOD CULTURE ID PANEL (REFLEXED)
Acinetobacter baumannii: NOT DETECTED
CANDIDA ALBICANS: NOT DETECTED
CANDIDA GLABRATA: NOT DETECTED
CANDIDA TROPICALIS: NOT DETECTED
Candida krusei: NOT DETECTED
Candida parapsilosis: NOT DETECTED
ENTEROBACTER CLOACAE COMPLEX: NOT DETECTED
ENTEROBACTERIACEAE SPECIES: NOT DETECTED
Enterococcus species: DETECTED — AB
Escherichia coli: NOT DETECTED
Haemophilus influenzae: NOT DETECTED
KLEBSIELLA PNEUMONIAE: NOT DETECTED
Klebsiella oxytoca: NOT DETECTED
Listeria monocytogenes: NOT DETECTED
NEISSERIA MENINGITIDIS: NOT DETECTED
Proteus species: NOT DETECTED
Pseudomonas aeruginosa: NOT DETECTED
STREPTOCOCCUS AGALACTIAE: NOT DETECTED
STREPTOCOCCUS PYOGENES: NOT DETECTED
STREPTOCOCCUS SPECIES: NOT DETECTED
Serratia marcescens: NOT DETECTED
Staphylococcus aureus (BCID): NOT DETECTED
Staphylococcus species: NOT DETECTED
Streptococcus pneumoniae: NOT DETECTED
VANCOMYCIN RESISTANCE: NOT DETECTED

## 2016-12-19 LAB — LACTIC ACID, PLASMA: LACTIC ACID, VENOUS: 1 mmol/L (ref 0.5–1.9)

## 2016-12-19 LAB — TROPONIN I: Troponin I: 0.08 ng/mL (ref <0.03)

## 2016-12-19 MED ORDER — CEFTRIAXONE SODIUM 1 G IJ SOLR
1.0000 g | INTRAMUSCULAR | Status: DC
  Filled 2016-12-19: qty 10

## 2016-12-19 MED ORDER — BACLOFEN 10 MG PO TABS
5.0000 mg | ORAL_TABLET | Freq: Three times a day (TID) | ORAL | Status: DC
  Administered 2016-12-19 – 2016-12-22 (×10): 5 mg via ORAL
  Filled 2016-12-19 (×10): qty 1

## 2016-12-19 MED ORDER — CHLORHEXIDINE GLUCONATE 0.12 % MT SOLN
15.0000 mL | Freq: Two times a day (BID) | OROMUCOSAL | Status: DC
  Administered 2016-12-19 – 2016-12-22 (×7): 15 mL via OROMUCOSAL
  Filled 2016-12-19 (×7): qty 15

## 2016-12-19 MED ORDER — SODIUM CHLORIDE 0.9 % IV SOLN
2.0000 g | Freq: Two times a day (BID) | INTRAVENOUS | Status: DC
  Administered 2016-12-19 – 2016-12-21 (×4): 2 g via INTRAVENOUS
  Filled 2016-12-19 (×5): qty 2000

## 2016-12-19 MED ORDER — ORAL CARE MOUTH RINSE
15.0000 mL | Freq: Two times a day (BID) | OROMUCOSAL | Status: DC
  Administered 2016-12-19 – 2016-12-22 (×5): 15 mL via OROMUCOSAL

## 2016-12-19 MED ORDER — HYDROMORPHONE HCL 2 MG PO TABS: 1.0000 mg | ORAL_TABLET | ORAL | Status: DC | PRN

## 2016-12-19 MED ORDER — HYDROCODONE-ACETAMINOPHEN 7.5-325 MG PO TABS
1.0000 | ORAL_TABLET | Freq: Four times a day (QID) | ORAL | Status: DC | PRN
  Administered 2016-12-19: 1 via ORAL
  Filled 2016-12-19: qty 1

## 2016-12-19 NOTE — Progress Notes (Signed)
PT Cancellation Note  Patient Details Name: Stephen Copeland MRN: [id number] DOB: 12/05/1940   Cancelled Treatment:    Reason Eval/Treat Not Completed: Patient declined, no reason specified Pt declined multiple times despite reasoning and encouragement.  Pt states he just "doesn't feel like it today, maybe later."  Will check back as schedule permits.   Jasreet Dickie,KATHrine E 12/19/2016, 4:24 PM Carmelia Bake, PT, DPT 12/19/2016 Pager: [phone]

## 2016-12-19 NOTE — Progress Notes (Signed)
Initial Nutrition Assessment  DOCUMENTATION CODES:   Severe malnutrition in context of chronic illness  INTERVENTION:  - Snacks BID - Promoted adequate PO intake  NUTRITION DIAGNOSIS:   Malnutrition (severe) related to chronic illness (taste changes/multiple urinary diagnoses) as evidenced by severe depletion of muscle mass, severe depletion of body fat, energy intake < 75% for > or equal to 1 month, percent weight loss (9% in 2 months).  GOAL:   Patient will meet greater than or equal to 90% of their needs  MONITOR:   PO intake, Supplement acceptance, I & O's  REASON FOR ASSESSMENT:   Malnutrition Screening Tool    ASSESSMENT:   Pt with PMH of scoliosis, hiatal hernia, acid reflux, HTN, HLD, multiple urinary diagnoses, and Nummular dermatitis presents with loss of appetite r/t changes in taste (like sugar) found acute renal failure     Spoke with pt at bedside. Of note pt has been followed by Urology for > 6 months, after finding blood in urine. Pt noted to have ulceration somewhere in his urinary tract.  Taste changes thought to be r/t uremia. Upon chart review, pt reported to RD in 05/2016 that food was tasting "sweet"  Pt reports poor appetite PTA, typically only consuming ice and water because food/beverages taste too much like sugar. Pt reports 40 lb weight loss in 2 months. Per chart, Pt presents with significant weight loss of 9% body weight in 2 months, significant for time frame.  Pt not willing to consume nutritional supplements during admission however willing to consume snacks.   Labs and medications reviewed  Nutrition focused physical exam completed. Findings include mild/moderate to severe fat depletion, severe muscle depletion and no edema.   Diet Order:  Diet renal with fluid restriction Fluid restriction: 1200 mL Fluid; Room service appropriate? Yes; Fluid consistency: Thin  Skin:  Reviewed, no issues  Last BM:  Unknown BM date  Height:   Ht  Readings from Last 1 Encounters:  12/18/16 6\' 2"  (1.88 m)    Weight:   Wt Readings from Last 1 Encounters:  12/18/16 150 lb 3.2 oz (68.1 kg)    Ideal Body Weight:  86.4 kg  BMI:  Body mass index is 19.28 kg/m.  Estimated Nutritional Needs:   Kcal:  1800-2000  Protein:  102-115 grams  Fluid:  <1.2 L/d  EDUCATION NEEDS:   Education needs no appropriate at this time  Parks Ranger, MS, RDN, LDN 12/19/2016 12:48 PM

## 2016-12-19 NOTE — Progress Notes (Signed)
PROGRESS NOTE  Stephen Copeland  KVQ:[id number] DOB: [birth year] DOA: 12/18/2016 PCP: Patient, No Pcp Per  Outpatient Specialists: [facility] Brief Narrative: Stephen Copeland is a 76 y.o. male with medical history significant of HTN, HLD, BPH, nummular dermatitis, scoliosis, sinus bradycardia who presented to the ED for 2 months of intermittent abdominal pain, loss of appetite, weight loss, change in taste and most recently weakness. He had been having severe difficulty with urination. He was afebrile with +UA findings and urinary retention. WBC 19.9, SCr 6.61, BUN 85, K 5.4, bicarbonate 19. Urology was consulted, though the patient declined their recommendation of foley catheterization initially. He was admitted, started on ceftriaxone, and finally consented to catheterization 10/17. Blood cultures have grown Enterococcus.   Assessment & Plan: Principal Problem:   Acute renal failure (ARF) (HCC) Active Problems:   Atrial fibrillation (HCC)   Hypertension   Elevated troponin   Chronic systolic CHF (congestive heart failure) (HCC)   Generalized weakness   Hyperkalemia   Hyperbilirubinemia   Right lower lobe pulmonary nodule   Noncompliance with treatment plan  Acute renal failure due to obstructive uropathy:  - Continue foley catheterization. No plans for voiding trial prior to follow up with [facility].  - Monitor BMP in AM.   Hyperkalemia: Resolved w/kayexalate.  - Monitor  Enterococcus UTI and bacteremia:  - Change CTX to ampicillin 2g q12h (renal dosing per pharmacy recommendations), monitor dose with anticipated improving renal function.  - Echocardiogram ordered. If negative, and not persistently bacteremic, would not require TEE.  Atrial fibrillation: CHA2DS2-VASc Scoreis 4 - Continue metoprolol for rate control.  - On eliquis at home, which would be replaced while inpatient, though the patient declines anticoagulation at this time.    Essential HTN: Chronic,  stable - Continue metoprolol as above and hydralazine.   Chronic systolic CHF: No evidence of exacerbation.  - Continue metoprolol and hydralazine. Not and ACE/ARB for unclear reasons, though will not start now.   Hyperbilirubinemia: Improving, suspect due to sepsis and renal failure.  - Monitor  Right lower lobe pulmonary nodule: 2 x 2 cm nodular opacity right lower lobe medially seen only on the frontal view on CXR 10/16.  - Plan chest CT to further assess on nonurgent basis.   DVT prophylaxis: Subcutaneous heparin Code Status: Full Family Communication: None at bedside Disposition Plan: Anticipate DC home once treatment of renal failure and bacteremia completed.   Consultants:   Urology, Dr. Lovena Neighbours  Procedures:   Foley 10/17 >>   Echocardiogram ordered  Antimicrobials:  Ceftriaxone 10/16 - 10/17  Ampicillin 10/17 >>    Subjective: Had foley placed earlier this morning, has discomfort related to this, now having hematuria with occasional clots. Reports chills without fever. No abdominal pain.   Objective: Vitals:   12/19/16 0646 12/19/16 0943 12/19/16 1003 12/19/16 1356  BP: (!) 156/97 (!) 161/89 (!) 152/85 (!) 146/84  Pulse: 69 72 68 63  Resp: 19 18  18   Temp: 98.1 F (36.7 C) 98.5 F (36.9 C)  98.3 F (36.8 C)  TempSrc: Oral Oral  Oral  SpO2: 97% 95%  96%  Weight:      Height:        Intake/Output Summary (Last 24 hours) at 12/19/16 1446 Last data filed at 12/19/16 1313  Gross per 24 hour  Intake              290 ml  Output             3020 ml  Net            -2730 ml   Filed Weights   12/18/16 2300  Weight: 68.1 kg (150 lb 3.2 oz)    Gen: 76 y.o. male in no distress Pulm: Non-labored breathing room air. Clear to auscultation bilaterally.  CV: Regular rate and rhythm. No murmur, rub, or gallop. No JVD, no pedal edema. GI: Abdomen soft, + suprapubic tenderness, non-distended, with normoactive bowel sounds. Ext: Warm, no deformities GU: Urethral  meatus normal with foley draining red urine with clots.  Neuro: Alert and oriented. No focal neurological deficits.  Data Reviewed: I have personally reviewed following labs and imaging studies  CBC:  Recent Labs Lab 12/18/16 1615 12/19/16 0432  WBC 19.9* 17.9*  HGB 13.5 11.1*  HCT 39.5 33.3*  MCV 93.6 92.8  PLT 223 275   Basic Metabolic Panel:  Recent Labs Lab 12/18/16 1615 12/19/16 0432  NA 144 141  K 5.4* 4.8  CL 111 109  CO2 19* 19*  GLUCOSE 145* 136*  BUN 85* 82*  CREATININE 6.61* 6.33*  CALCIUM 10.0 9.0   GFR: Estimated Creatinine Clearance: 9.7 mL/min (A) (by C-G formula based on SCr of 6.33 mg/dL (H)). Liver Function Tests:  Recent Labs Lab 12/18/16 1615 12/19/16 0432  AST 16 12*  ALT 14* 11*  ALKPHOS 107 87  BILITOT 1.7* 1.5*  PROT 7.8 6.8  ALBUMIN 4.3 3.5   No results for input(s): LIPASE, AMYLASE in the last 168 hours. No results for input(s): AMMONIA in the last 168 hours. Coagulation Profile: No results for input(s): INR, PROTIME in the last 168 hours. Cardiac Enzymes:  Recent Labs Lab 12/18/16 1615 12/19/16 0432  TROPONINI 0.07* 0.08*   BNP (last 3 results) No results for input(s): PROBNP in the last 8760 hours. HbA1C: No results for input(s): HGBA1C in the last 72 hours. CBG: No results for input(s): GLUCAP in the last 168 hours. Lipid Profile: No results for input(s): CHOL, HDL, LDLCALC, TRIG, CHOLHDL, LDLDIRECT in the last 72 hours. Thyroid Function Tests: No results for input(s): TSH, T4TOTAL, FREET4, T3FREE, THYROIDAB in the last 72 hours. Anemia Panel: No results for input(s): VITAMINB12, FOLATE, FERRITIN, TIBC, IRON, RETICCTPCT in the last 72 hours. Urine analysis:    Component Value Date/Time   COLORURINE YELLOW 12/18/2016 1627   APPEARANCEUR CLEAR 12/18/2016 1627   LABSPEC 1.009 12/18/2016 1627   PHURINE 5.0 12/18/2016 1627   GLUCOSEU NEGATIVE 12/18/2016 1627   HGBUR NEGATIVE 12/18/2016 1627   BILIRUBINUR NEGATIVE  12/18/2016 1627   BILIRUBINUR negative 06/12/2016 1146   KETONESUR NEGATIVE 12/18/2016 1627   PROTEINUR NEGATIVE 12/18/2016 1627   UROBILINOGEN 1.0 06/12/2016 1146   NITRITE NEGATIVE 12/18/2016 1627   LEUKOCYTESUR SMALL (A) 12/18/2016 1627   Recent Results (from the past 240 hour(s))  Culture, blood (routine x 2)     Status: None (Preliminary result)   Collection Time: 12/18/16  6:09 PM  Result Value Ref Range Status   Specimen Description BLOOD RIGHT WRIST  Final   Special Requests   Final    BOTTLES DRAWN AEROBIC AND ANAEROBIC Blood Culture adequate volume   Culture  Setup Time   Final    GRAM POSITIVE COCCI IN CHAINS AEROBIC BOTTLE ONLY CRITICAL RESULT CALLED TO, READ BACK BY AND VERIFIED WITH: E. WILLIAMSON, RPHARMD ([place]) AT 1700 ON 12/19/16 BY C. JESSUP, MLT. Performed at [facility], [place] [address] [place], [place] [address]    Culture [facility] POSITIVE COCCI  Final  Report Status PENDING  Incomplete  Blood Culture ID Panel (Reflexed)     Status: Abnormal   Collection Time: 12/18/16  6:09 PM  Result Value Ref Range Status   Enterococcus species DETECTED (A) NOT DETECTED Final    Comment: CRITICAL RESULT CALLED TO, READ BACK BY AND VERIFIED WITH: E, WILLIAMSON, RPHARMD ([place]) AT 1439 ON 12/19/16 BY C. JESSUP, MLT.    Vancomycin resistance NOT DETECTED NOT DETECTED Final   Listeria monocytogenes NOT DETECTED NOT DETECTED Final   Staphylococcus species NOT DETECTED NOT DETECTED Final   Staphylococcus aureus NOT DETECTED NOT DETECTED Final   Streptococcus species NOT DETECTED NOT DETECTED Final   Streptococcus agalactiae NOT DETECTED NOT DETECTED Final   Streptococcus pneumoniae NOT DETECTED NOT DETECTED Final   Streptococcus pyogenes NOT DETECTED NOT DETECTED Final   Acinetobacter baumannii NOT DETECTED NOT DETECTED Final   Enterobacteriaceae species NOT DETECTED NOT DETECTED Final   Enterobacter cloacae complex NOT DETECTED NOT DETECTED Final   Escherichia coli NOT  DETECTED NOT DETECTED Final   Klebsiella oxytoca NOT DETECTED NOT DETECTED Final   Klebsiella pneumoniae NOT DETECTED NOT DETECTED Final   Proteus species NOT DETECTED NOT DETECTED Final   Serratia marcescens NOT DETECTED NOT DETECTED Final   Haemophilus influenzae NOT DETECTED NOT DETECTED Final   Neisseria meningitidis NOT DETECTED NOT DETECTED Final   Pseudomonas aeruginosa NOT DETECTED NOT DETECTED Final   Candida albicans NOT DETECTED NOT DETECTED Final   Candida glabrata NOT DETECTED NOT DETECTED Final   Candida krusei NOT DETECTED NOT DETECTED Final   Candida parapsilosis NOT DETECTED NOT DETECTED Final   Candida tropicalis NOT DETECTED NOT DETECTED Final    Comment: Performed at [facility], [place] [address] [place], [place] [address]      Radiology Studies: Dg Chest 2 View  Result Date: 12/18/2016 CLINICAL DATA:  Weight loss and fatigue EXAM: CHEST  2 VIEW COMPARISON:  June 12, 2016 FINDINGS: On the frontal view, there is a 2 x 2 cm nodular opacity in the right lower lobe region. There is atelectatic change in the left base. The lungs elsewhere clear. Heart is mildly enlarged with pulmonary vascularity within normal limits. No evident adenopathy. There is aortic atherosclerosis. No evident bone lesions. IMPRESSION: 2 x 2 cm nodular opacity right lower lobe medially seen only on the frontal view. Advise noncontrast enhanced chest CT to further assess. Atelectasis left base.  No edema or consolidation. Stable cardiac silhouette.  There is aortic atherosclerosis. Aortic Atherosclerosis (ICD10-I70.0). Electronically Signed   By: Lowella Grip III M.D.   On: 12/18/2016 17:16    Scheduled Meds: . baclofen  5 mg Oral TID  . chlorhexidine  15 mL Mouth Rinse BID  . heparin  5,000 Units Subcutaneous Q8H  . hydrALAZINE  10 mg Oral Q8H  . mouth rinse  15 mL Mouth Rinse q12n4p  . metoprolol tartrate  5 mg Intravenous Once  . metoprolol tartrate  12.5 mg Oral BID   Continuous  Infusions: . cefTRIAXone (ROCEPHIN)  IV       LOS: 1 day   Time spent: 25 minutes.  Vance Gather, MD [facility] Pager [phone]  If 7PM-7AM, please contact night-coverage [email or web address] Password [email or web address] 12/19/2016, 2:46 PM

## 2016-12-19 NOTE — Progress Notes (Signed)
Dr Bonner Puna made aware of blood tinged urine and small amount of clots present in foley. Stephen Copeland

## 2016-12-20 ENCOUNTER — Inpatient Hospital Stay (HOSPITAL_COMMUNITY): Payer: Medicare Other

## 2016-12-20 DIAGNOSIS — R7881 Bacteremia: Secondary | ICD-10-CM

## 2016-12-20 DIAGNOSIS — B952 Enterococcus as the cause of diseases classified elsewhere: Secondary | ICD-10-CM

## 2016-12-20 DIAGNOSIS — R338 Other retention of urine: Secondary | ICD-10-CM

## 2016-12-20 DIAGNOSIS — N390 Urinary tract infection, site not specified: Secondary | ICD-10-CM

## 2016-12-20 DIAGNOSIS — N3000 Acute cystitis without hematuria: Secondary | ICD-10-CM

## 2016-12-20 DIAGNOSIS — I361 Nonrheumatic tricuspid (valve) insufficiency: Secondary | ICD-10-CM

## 2016-12-20 LAB — BASIC METABOLIC PANEL
Anion gap: 17 — ABNORMAL HIGH (ref 5–15)
BUN: 96 mg/dL — AB (ref 6–20)
CO2: 20 mmol/L — ABNORMAL LOW (ref 22–32)
CREATININE: 5.81 mg/dL — AB (ref 0.61–1.24)
Calcium: 9 mg/dL (ref 8.9–10.3)
Chloride: 108 mmol/L (ref 101–111)
GFR, EST AFRICAN AMERICAN: 10 mL/min — AB (ref >60)
GFR, EST NON AFRICAN AMERICAN: 9 mL/min — AB (ref >60)
Glucose, Bld: 97 mg/dL (ref 65–99)
POTASSIUM: 3.8 mmol/L (ref 3.5–5.1)
SODIUM: 145 mmol/L (ref 135–145)

## 2016-12-20 LAB — CBC
HEMATOCRIT: 36 % — AB (ref 39.0–52.0)
Hemoglobin: 12 g/dL — ABNORMAL LOW (ref 13.0–17.0)
MCH: 31.1 pg (ref 26.0–34.0)
MCHC: 33.3 g/dL (ref 30.0–36.0)
MCV: 93.3 fL (ref 78.0–100.0)
PLATELETS: 203 10*3/uL (ref 150–400)
RBC: 3.86 MIL/uL — AB (ref 4.22–5.81)
RDW: 15.3 % (ref 11.5–15.5)
WBC: 9.4 10*3/uL (ref 4.0–10.5)

## 2016-12-20 LAB — ECHOCARDIOGRAM COMPLETE
Height: 74 in
Weight: 2403.2 oz

## 2016-12-20 NOTE — Consult Note (Signed)
[facility] for Infectious Disease    Date of Admission:  12/18/2016   Total days of antibiotics 2 (ceftriaxone --> ampicillin)               Reason for Consult: Enterococcal bacteremia    Referring Provider: CHAMP   Assessment: Enterococcal bacteremia Enterococcal UTI Bladder outlet obstruction Acute renal failure CHF  Plan: 1. Continue ampicillin 2. TTE negative for vegetation. LVEF 30-35% 3. Repeat BCx 4. Watch Cr 5. Appreciate TRH and pharmacy's excellent care of pt.   Comment- Agree that would not check TEE in pt as he has defined source, as long as his repeat BCx are negative.  As long as repeat BCx are negative, would give him 2 weeks of ampicillin after negative BCx. Available as needed.   Thank you so much for this interesting consult,  Principal Problem:   Acute renal failure (ARF) ([place]) Active Problems:   Atrial fibrillation (HCC)   Hypertension   Elevated troponin   Chronic systolic CHF (congestive heart failure) (HCC)   Acute urinary retention   Generalized weakness   Hyperkalemia   Hyperbilirubinemia   Right lower lobe pulmonary nodule   Noncompliance with treatment plan   Bacteremia due to Enterococcus   Enterococcus UTI   . baclofen  5 mg Oral TID  . chlorhexidine  15 mL Mouth Rinse BID  . heparin  5,000 Units Subcutaneous Q8H  . hydrALAZINE  10 mg Oral Q8H  . mouth rinse  15 mL Mouth Rinse q12n4p  . metoprolol tartrate  5 mg Intravenous Once  . metoprolol tartrate  12.5 mg Oral BID    HPI: Stephen Copeland is a 76 y.o. male with hx of HTN, GERD, comes to ED on 10-16 with 2 months of feeling poorly, wt loss. He denied f/c or dysuria but did note difficulty with initiating urination. In ED he was found to have ARF (1.4 --> 6.6) due to bladder outlet obstruction. He had 800 cc residual on u/s but refused foley in ED.  He was started on ceftriaxone and finally consented to foley on 10-17.  His BCx are now notable for Enterococcus  fecalis.  His ambx were changed to ampicillin.    Review of Systems: Review of Systems  Constitutional: Positive for weight loss. Negative for chills and fever.  Eyes: Negative for blurred vision.  Respiratory: Negative for cough and shortness of breath.   Cardiovascular: Positive for leg swelling.  Gastrointestinal: Positive for constipation. Negative for diarrhea.  Genitourinary: Negative for dysuria.       Hesitancy  Please see HPI. All other systems reviewed and negative.   Past Medical History:  Diagnosis Date  . Acid reflux   . Folliculitis   . Hiatal hernia   . Hyperlipidemia    a. pt is adamantly against statins.  . Hypertension   . Ischemia    a. Pt states he was diagnosed with "ischemia" in the 1990s but does not know further details, denies hx of heart blockage.  . Nummular dermatitis   . Scoliosis   . Sinus bradycardia   . Stomach ulcer    a. remote ulcer in the 1990s, no hx of bleeding.    Social History  Substance Use Topics  . Smoking status: Never Smoker  . Smokeless tobacco: Never Used  . Alcohol use No    Family History  Problem Relation Age of Onset  . Heart disease Mother  Further details not reported, died at age 22  . Valvular heart disease Father        H/o MV surgery, diet at age 61     Medications:  Scheduled: . baclofen  5 mg Oral TID  . chlorhexidine  15 mL Mouth Rinse BID  . heparin  5,000 Units Subcutaneous Q8H  . hydrALAZINE  10 mg Oral Q8H  . mouth rinse  15 mL Mouth Rinse q12n4p  . metoprolol tartrate  5 mg Intravenous Once  . metoprolol tartrate  12.5 mg Oral BID    Abtx:  Anti-infectives    Start     Dose/Rate Route Frequency Ordered Stop   12/19/16 1800  cefTRIAXone (ROCEPHIN) 1 g in dextrose 5 % 50 mL IVPB  Status:  Discontinued     1 g 100 mL/hr over 30 Minutes Intravenous Every 24 hours 12/19/16 0253 12/19/16 1508   12/19/16 1600  ampicillin (OMNIPEN) 2 g in sodium chloride 0.9 % 50 mL IVPB     2 g 150  mL/hr over 20 Minutes Intravenous Every 12 hours 12/19/16 1508     12/18/16 1845  cefTRIAXone (ROCEPHIN) 1 g in dextrose 5 % 50 mL IVPB     1 g 100 mL/hr over 30 Minutes Intravenous  Once 12/18/16 1830 12/18/16 2004        OBJECTIVE: Blood pressure (!) 142/83, pulse 61, temperature (!) 97.5 F (36.4 C), temperature source Oral, resp. rate 18, height '6\' 2"'  (1.88 m), weight 68.1 kg (150 lb 3.2 oz), SpO2 95 %.  Physical Exam  Constitutional: He is well-developed, well-nourished, and in no distress. No distress.  HENT:  Mouth/Throat: No oropharyngeal exudate.  Eyes: Pupils are equal, round, and reactive to light. EOM are normal.  Neck: Neck supple.  Cardiovascular: Normal rate, regular rhythm and normal heart sounds.   Pulmonary/Chest: Effort normal.  Abdominal: Soft. Bowel sounds are normal. There is no tenderness. There is no rebound.  Musculoskeletal: He exhibits no edema.  Lymphadenopathy:    He has no cervical adenopathy.  Skin: Skin is warm and dry. He is not diaphoretic.    Lab Results Results for orders placed or performed during the hospital encounter of 12/18/16 (from the past 48 hour(s))  Urine culture     Status: Abnormal (Preliminary result)   Collection Time: 12/18/16  6:49 PM  Result Value Ref Range   Specimen Description URINE, CLEAN CATCH    Special Requests NONE    Culture (A)     80,000 COLONIES/mL ENTEROCOCCUS FAECALIS 20,000 COLONIES/mL STAPHYLOCOCCUS HAEMOLYTICUS    Report Status PENDING   Culture, blood (routine x 2)     Status: None (Preliminary result)   Collection Time: 12/18/16  9:46 PM  Result Value Ref Range   Specimen Description BLOOD LEFT ARM    Special Requests IN PEDIATRIC BOTTLE Blood Culture adequate volume    Culture      NO GROWTH 2 DAYS Performed at [facility], [place] [address] [place], [place] [address]    Report Status PENDING   Comprehensive metabolic panel     Status: Abnormal   Collection Time: 12/19/16  4:32 AM    Result Value Ref Range   Sodium 141 135 - 145 mmol/L   Potassium 4.8 3.5 - 5.1 mmol/L   Chloride 109 101 - 111 mmol/L   CO2 19 (L) 22 - 32 mmol/L   Glucose, Bld 136 (H) 65 - 99 mg/dL   BUN 82 (H) 6 - 20 mg/dL  Creatinine, Ser 6.33 (H) 0.61 - 1.24 mg/dL   Calcium 9.0 8.9 - 10.3 mg/dL   Total Protein 6.8 6.5 - 8.1 g/dL   Albumin 3.5 3.5 - 5.0 g/dL   AST 12 (L) 15 - 41 U/L   ALT 11 (L) 17 - 63 U/L   Alkaline Phosphatase 87 38 - 126 U/L   Total Bilirubin 1.5 (H) 0.3 - 1.2 mg/dL   GFR calc non Af Amer 8 (L) >60 mL/min   GFR calc Af Amer 9 (L) >60 mL/min    Comment: (NOTE) The eGFR has been calculated using the CKD EPI equation. This calculation has not been validated in all clinical situations. eGFR's persistently <60 mL/min signify possible Chronic Kidney Disease.    Anion gap 13 5 - 15  CBC     Status: Abnormal   Collection Time: 12/19/16  4:32 AM  Result Value Ref Range   WBC 17.9 (H) 4.0 - 10.5 K/uL   RBC 3.59 (L) 4.22 - 5.81 MIL/uL   Hemoglobin 11.1 (L) 13.0 - 17.0 g/dL   HCT 33.3 (L) 39.0 - 52.0 %   MCV 92.8 78.0 - 100.0 fL   MCH 30.9 26.0 - 34.0 pg   MCHC 33.3 30.0 - 36.0 g/dL   RDW 15.4 11.5 - 15.5 %   Platelets 189 150 - 400 K/uL  Troponin I     Status: Abnormal   Collection Time: 12/19/16  4:32 AM  Result Value Ref Range   Troponin I 0.08 (HH) <0.03 ng/mL    Comment: CRITICAL VALUE NOTED.  VALUE IS CONSISTENT WITH PREVIOUSLY REPORTED AND CALLED VALUE.  Lactic acid, plasma     Status: None   Collection Time: 12/19/16  4:32 AM  Result Value Ref Range   Lactic Acid, Venous 1.0 0.5 - 1.9 mmol/L  Basic metabolic panel     Status: Abnormal   Collection Time: 12/20/16  4:57 AM  Result Value Ref Range   Sodium 145 135 - 145 mmol/L   Potassium 3.8 3.5 - 5.1 mmol/L    Comment: DELTA CHECK NOTED NO VISIBLE HEMOLYSIS    Chloride 108 101 - 111 mmol/L   CO2 20 (L) 22 - 32 mmol/L   Glucose, Bld 97 65 - 99 mg/dL   BUN 96 (H) 6 - 20 mg/dL   Creatinine, Ser 5.81 (H)  0.61 - 1.24 mg/dL   Calcium 9.0 8.9 - 10.3 mg/dL   GFR calc non Af Amer 9 (L) >60 mL/min   GFR calc Af Amer 10 (L) >60 mL/min    Comment: (NOTE) The eGFR has been calculated using the CKD EPI equation. This calculation has not been validated in all clinical situations. eGFR's persistently <60 mL/min signify possible Chronic Kidney Disease.    Anion gap 17 (H) 5 - 15  CBC     Status: Abnormal   Collection Time: 12/20/16  4:57 AM  Result Value Ref Range   WBC 9.4 4.0 - 10.5 K/uL   RBC 3.86 (L) 4.22 - 5.81 MIL/uL   Hemoglobin 12.0 (L) 13.0 - 17.0 g/dL   HCT 36.0 (L) 39.0 - 52.0 %   MCV 93.3 78.0 - 100.0 fL   MCH 31.1 26.0 - 34.0 pg   MCHC 33.3 30.0 - 36.0 g/dL   RDW 15.3 11.5 - 15.5 %   Platelets 203 150 - 400 K/uL      Component Value Date/Time   SDES BLOOD LEFT ARM 12/18/2016 2146   SPECREQUEST IN PEDIATRIC BOTTLE Blood Culture  adequate volume 12/18/2016 2146   CULT  12/18/2016 2146    NO GROWTH 2 DAYS Performed at [facility] [address], [place], [place] [address]    REPTSTATUS PENDING 12/18/2016 2146   No results found. Recent Results (from the past 240 hour(s))  Culture, blood (routine x 2)     Status: Abnormal (Preliminary result)   Collection Time: 12/18/16  6:09 PM  Result Value Ref Range Status   Specimen Description BLOOD RIGHT WRIST  Final   Special Requests   Final    BOTTLES DRAWN AEROBIC AND ANAEROBIC Blood Culture adequate volume   Culture  Setup Time   Final    GRAM POSITIVE COCCI IN CHAINS AEROBIC BOTTLE ONLY CRITICAL RESULT CALLED TO, READ BACK BY AND VERIFIED WITH: E. [place], RPHARMD ([place]) AT 2956 ON 12/19/16 BY C. JESSUP, MLT. Performed at [facility], [place] [address], [place], [place] [address]    Culture ENTEROCOCCUS FAECALIS (A)  Final   Report Status PENDING  Incomplete  Blood Culture ID Panel (Reflexed)     Status: Abnormal   Collection Time: 12/18/16  6:09 PM  Result Value Ref Range Status   Enterococcus species DETECTED  (A) NOT DETECTED Final    Comment: CRITICAL RESULT CALLED TO, READ BACK BY AND VERIFIED WITH: E, [place], RPHARMD ([place]) AT 1439 ON 12/19/16 BY C. JESSUP, MLT.    Vancomycin resistance NOT DETECTED NOT DETECTED Final   Listeria monocytogenes NOT DETECTED NOT DETECTED Final   Staphylococcus species NOT DETECTED NOT DETECTED Final   Staphylococcus aureus NOT DETECTED NOT DETECTED Final   Streptococcus species NOT DETECTED NOT DETECTED Final   Streptococcus agalactiae NOT DETECTED NOT DETECTED Final   Streptococcus pneumoniae NOT DETECTED NOT DETECTED Final   Streptococcus pyogenes NOT DETECTED NOT DETECTED Final   Acinetobacter baumannii NOT DETECTED NOT DETECTED Final   Enterobacteriaceae species NOT DETECTED NOT DETECTED Final   Enterobacter cloacae complex NOT DETECTED NOT DETECTED Final   Escherichia coli NOT DETECTED NOT DETECTED Final   Klebsiella oxytoca NOT DETECTED NOT DETECTED Final   Klebsiella pneumoniae NOT DETECTED NOT DETECTED Final   Proteus species NOT DETECTED NOT DETECTED Final   Serratia marcescens NOT DETECTED NOT DETECTED Final   Haemophilus influenzae NOT DETECTED NOT DETECTED Final   Neisseria meningitidis NOT DETECTED NOT DETECTED Final   Pseudomonas aeruginosa NOT DETECTED NOT DETECTED Final   Candida albicans NOT DETECTED NOT DETECTED Final   Candida glabrata NOT DETECTED NOT DETECTED Final   Candida krusei NOT DETECTED NOT DETECTED Final   Candida parapsilosis NOT DETECTED NOT DETECTED Final   Candida tropicalis NOT DETECTED NOT DETECTED Final    Comment: Performed at [facility], [place] [address] [place], [place] [address]  Urine culture     Status: Abnormal (Preliminary result)   Collection Time: 12/18/16  6:49 PM  Result Value Ref Range Status   Specimen Description URINE, CLEAN CATCH  Final   Special Requests NONE  Final   Culture (A)  Final    80,000 COLONIES/mL ENTEROCOCCUS FAECALIS 20,000 COLONIES/mL STAPHYLOCOCCUS HAEMOLYTICUS    Report  Status PENDING  Incomplete  Culture, blood (routine x 2)     Status: None (Preliminary result)   Collection Time: 12/18/16  9:46 PM  Result Value Ref Range Status   Specimen Description BLOOD LEFT ARM  Final   Special Requests IN PEDIATRIC BOTTLE Blood Culture adequate volume  Final   Culture   Final    NO  GROWTH 2 DAYS Performed at [facility], [place] [address], [place], [place] [address]    Report Status PENDING  Incomplete    Microbiology: Recent Results (from the past 240 hour(s))  Culture, blood (routine x 2)     Status: Abnormal (Preliminary result)   Collection Time: 12/18/16  6:09 PM  Result Value Ref Range Status   Specimen Description BLOOD RIGHT WRIST  Final   Special Requests   Final    BOTTLES DRAWN AEROBIC AND ANAEROBIC Blood Culture adequate volume   Culture  Setup Time   Final    GRAM POSITIVE COCCI IN CHAINS AEROBIC BOTTLE ONLY CRITICAL RESULT CALLED TO, READ BACK BY AND VERIFIED WITH: E. [place], RPHARMD ([place]) AT 2671 ON 12/19/16 BY C. JESSUP, MLT. Performed at [facility], [place] [address] [place], [place] [address]    Culture ENTEROCOCCUS FAECALIS (A)  Final   Report Status PENDING  Incomplete  Blood Culture ID Panel (Reflexed)     Status: Abnormal   Collection Time: 12/18/16  6:09 PM  Result Value Ref Range Status   Enterococcus species DETECTED (A) NOT DETECTED Final    Comment: CRITICAL RESULT CALLED TO, READ BACK BY AND VERIFIED WITH: E, [place], RPHARMD (WL) AT 1439 ON 12/19/16 BY C. JESSUP, MLT.    Vancomycin resistance NOT DETECTED NOT DETECTED Final   Listeria monocytogenes NOT DETECTED NOT DETECTED Final   Staphylococcus species NOT DETECTED NOT DETECTED Final   Staphylococcus aureus NOT DETECTED NOT DETECTED Final   Streptococcus species NOT DETECTED NOT DETECTED Final   Streptococcus agalactiae NOT DETECTED NOT DETECTED Final   Streptococcus pneumoniae NOT DETECTED NOT DETECTED Final   Streptococcus pyogenes NOT DETECTED NOT  DETECTED Final   Acinetobacter baumannii NOT DETECTED NOT DETECTED Final   Enterobacteriaceae species NOT DETECTED NOT DETECTED Final   Enterobacter cloacae complex NOT DETECTED NOT DETECTED Final   Escherichia coli NOT DETECTED NOT DETECTED Final   Klebsiella oxytoca NOT DETECTED NOT DETECTED Final   Klebsiella pneumoniae NOT DETECTED NOT DETECTED Final   Proteus species NOT DETECTED NOT DETECTED Final   Serratia marcescens NOT DETECTED NOT DETECTED Final   Haemophilus influenzae NOT DETECTED NOT DETECTED Final   Neisseria meningitidis NOT DETECTED NOT DETECTED Final   Pseudomonas aeruginosa NOT DETECTED NOT DETECTED Final   Candida albicans NOT DETECTED NOT DETECTED Final   Candida glabrata NOT DETECTED NOT DETECTED Final   Candida krusei NOT DETECTED NOT DETECTED Final   Candida parapsilosis NOT DETECTED NOT DETECTED Final   Candida tropicalis NOT DETECTED NOT DETECTED Final    Comment: Performed at [facility], [place] [address], [place], [place] [address]  Urine culture     Status: Abnormal (Preliminary result)   Collection Time: 12/18/16  6:49 PM  Result Value Ref Range Status   Specimen Description URINE, CLEAN CATCH  Final   Special Requests NONE  Final   Culture (A)  Final    80,000 COLONIES/mL ENTEROCOCCUS FAECALIS 20,000 COLONIES/mL STAPHYLOCOCCUS HAEMOLYTICUS    Report Status PENDING  Incomplete  Culture, blood (routine x 2)     Status: None (Preliminary result)   Collection Time: 12/18/16  9:46 PM  Result Value Ref Range Status   Specimen Description BLOOD LEFT ARM  Final   Special Requests IN PEDIATRIC BOTTLE Blood Culture adequate volume  Final   Culture   Final    NO GROWTH 2 DAYS Performed at [facility], [place] [address] [place], [place]  [address]    Report Status PENDING  Incomplete   Labs, radiographs were personally reviewed by me.   Bobby Rumpf, MD [facility] [place]  [facility] [phone] 12/20/2016, 6:27 PM

## 2016-12-20 NOTE — Progress Notes (Signed)
Physical Therapy Treatment Patient Details Name: Stephen Copeland MRN: [id number] DOB: 1940-09-14 Today's Date: 12/20/2016    History of Present Illness Atzel Mccambridge is a 76 y.o. male with medical history significant of GERD, hiatal hernia, folliculitis, hyperlipidemia, hypertension,  Nummular dermatitis, scoliosis, sinus bradycardia who is coming in to the emergency department 12/18/16 with complaints of 2 months of not feeling well, intermittent abdominal pain, loss of appetite, weight loss.  patient foubnd to have urinary retension,.    PT Comments    Assisted  Patient to the [place]. Frequent cues for redirection and safety while mobilizing.    Follow Up Recommendations  Home health PT;Supervision/Assistance - 24 hour     Equipment Recommendations  None recommended by PT    Recommendations for Other Services       Precautions / Restrictions Precautions Precautions: Fall Restrictions Weight Bearing Restrictions: No    Mobility  Bed Mobility Overal bed mobility: Independent                Transfers Overall transfer level: Needs assistance Equipment used: Rolling walker (2 wheeled) Transfers: Sit to/from Stand Sit to Stand: Min guard         General transfer comment: from recliner and low toilet, required use of  rail beside toilet. , cues for safety  Ambulation/Gait Ambulation/Gait assistance: Min guard Ambulation Distance (Feet): 20 Feet (x 2) Assistive device: Rolling walker (2 wheeled) Gait Pattern/deviations: Step-to pattern;Step-through pattern   Gait velocity interpretation: Below normal speed for age/gender General Gait Details: cues for safety around objects. Cues for safety as patient wanted to place toilet paper on  toilet lid, siting it was not clean. the patient able to lean over to complete the task.   Stairs            Wheelchair Mobility    Modified Rankin (Stroke Patients Only)       Balance Overall balance assessment: Needs  assistance Sitting-balance support: No upper extremity supported;Feet supported Sitting balance-Leahy Scale: Good     Standing balance support: During functional activity;No upper extremity supported Standing balance-Leahy Scale: Fair Standing balance comment: Cues for safety as patient wanted to place toilet paper on  toilet lid, siting it was not clean. the patient able to lean over to complete the task                            Cognition Arousal/Alertness: Awake/alert Behavior During Therapy: Impulsive Overall Cognitive Status: Within Functional Limits for tasks assessed                                 General Comments: very talkative and explainss everything along the way, perseverates on his  former work.      Exercises      General Comments        Pertinent Vitals/Pain Pain Assessment: No/denies pain    Home Living Family/patient expects to be discharged to:: Private residence Living Arrangements: Spouse/significant other Available Help at Discharge: Family;Available 24 hours/day Type of Home: House Home Access: Stairs to enter   Home Layout: One level Home Equipment: Walker - 2 wheels Additional Comments: pt indicates his wife is older than him and that she uses a RW.      Prior Function Level of Independence: Independent          PT Goals (current goals can now be found in the care  plan section) Acute Rehab PT Goals Patient Stated Goal: to go home, drive again PT Goal Formulation: With patient Time For Goal Achievement: 01/03/17 Potential to Achieve Goals: Good Progress towards PT goals: Progressing toward goals    Frequency    Min 3X/week      PT Plan Current plan remains appropriate    Co-evaluation              AM-PAC PT "6 Clicks" Daily Activity  Outcome Measure  Difficulty turning over in bed (including adjusting bedclothes, sheets and blankets)?: None Difficulty moving from lying on back to sitting on  the side of the bed? : None Difficulty sitting down on and standing up from a chair with arms (e.g., wheelchair, bedside commode, etc,.)?: A Little Help needed moving to and from a bed to chair (including a wheelchair)?: A Little Help needed walking in hospital room?: A Little Help needed climbing 3-5 steps with a railing? : A Little 6 Click Score: 20    End of Session Equipment Utilized During Treatment: Gait belt Activity Tolerance: Patient tolerated treatment well Patient left: in bed;with call bell/phone within reach;with bed alarm set Nurse Communication: Mobility status PT Visit Diagnosis: Unsteadiness on feet (R26.81)     Time: 4580-9983 PT Time Calculation (min) (ACUTE ONLY): 30 min  Charges:  $Gait Training: 8-22 mins $Self Care/Home Management: 8-22                    G CodesTresa Endo PT [phone]    Claretha Cooper 12/20/2016, 5:13 PM

## 2016-12-20 NOTE — Progress Notes (Signed)
  Echocardiogram 2D Echocardiogram has been performed.  Tresa Res 12/20/2016, 2:23 PM

## 2016-12-20 NOTE — Progress Notes (Signed)
PROGRESS NOTE  Stephen Copeland  XMI:[id number] DOB: 1940/05/11 DOA: 12/18/2016 PCP: Patient, No Pcp Per  Outpatient Specialists: [facility] Brief Narrative: Stephen Copeland is a 76 y.o. male with medical history significant of HTN, HLD, BPH, nummular dermatitis, scoliosis, sinus bradycardia who presented to the ED for 2 months of intermittent abdominal pain, loss of appetite, weight loss, change in taste and most recently weakness. He had been having severe difficulty with urination. He was afebrile with +UA findings and urinary retention. WBC 19.9, SCr 6.61, BUN 85, K 5.4, bicarbonate 19. Urology was consulted, though the patient declined their recommendation of foley catheterization initially. He was admitted, started on ceftriaxone, and finally consented to catheterization 10/17. Blood cultures have grown Enterococcus.   Assessment & Plan: Principal Problem:   Acute renal failure (ARF) (HCC) Active Problems:   Atrial fibrillation (HCC)   Hypertension   Elevated troponin   Chronic systolic CHF (congestive heart failure) (HCC)   Acute urinary retention   Generalized weakness   Hyperkalemia   Hyperbilirubinemia   Right lower lobe pulmonary nodule   Noncompliance with treatment plan   Bacteremia due to Enterococcus   Enterococcus UTI  Acute renal failure due to obstructive uropathy: Mild improvement.  - Continue foley catheterization. No plans for voiding trial prior to follow up with [facility].  - Monitor BMP in AM.  - Liberalize diet to include no fluid restriction.   Hyperkalemia: Resolved w/kayexalate.  - Monitor  Enterococcus UTI and bacteremia:  - Continue ampicillin 2g q12h (renal dosing per pharmacy recommendations), monitor dose with anticipated improving renal function.  - No vegetations on echo 10/18, if not persistently bacteremic, would not require TEE.  Atrial fibrillation: CHA2DS2-VASc Scoreis 4 - Continue metoprolol for rate control.  - On eliquis at  home, which would be replaced while inpatient, though the patient declines anticoagulation at this time.    Essential HTN: Chronic, stable - Continue metoprolol as above and hydralazine.   Chronic combined systolic and diastolic CHF: No evidence of exacerbation. Echo repeated for vegetation r/o, showing stable EF 35% w/G1DD (was G2DD).   - Continue metoprolol and hydralazine. Not and ACE/ARB for unclear reasons, though will not start now with renal failure.   Hyperbilirubinemia: Improving, suspect due to sepsis and renal failure.  - Monitor  Right lower lobe pulmonary nodule: 2 x 2 cm nodular opacity right lower lobe medially seen only on the frontal view on CXR 10/16.  - Plan chest CT to further assess on nonurgent basis.   DVT prophylaxis: Subcutaneous heparin Code Status: Full Family Communication: None at bedside Disposition Plan: Anticipate DC home once treatment of renal failure and bacteremia completed, possibly 10/20 AM if cultures repeat cultures negative.   Consultants:   Urology, Dr. Lovena Neighbours  Procedures:   Foley 10/17 >>   Echocardiogram 12/20/2016: Study Conclusions - Left ventricle: The cavity size was normal. There was mild   concentric hypertrophy. Systolic function was moderately to   severely reduced. The estimated ejection fraction was in the   range of 30% to 35%. Wall motion was normal; there were no   regional wall motion abnormalities. Doppler parameters are   consistent with abnormal left ventricular relaxation (grade 1   diastolic dysfunction). Doppler parameters are consistent with   elevated ventricular end-diastolic filling pressure. - Aortic valve: There was no regurgitation. - Mitral valve: There was mild regurgitation. - Left atrium: The atrium was mildly dilated. - Right ventricle: Systolic function was normal. - Right atrium: The atrium was  normal in size. - Tricuspid valve: There was mild regurgitation. - Inferior vena cava: The vessel was  normal in size. - Pericardium, extracardiac: A mild anteriorly located pericardial   effusion was identified. Features were not consistent with   tamponade physiology.  Impressions: - There was no evidence of a vegetation.  Antimicrobials:  Ceftriaxone 10/16 - 10/17  Ampicillin 10/17 >>    Subjective: Had pain subside yesterday in urethra and is starting to get taste back. No further bleeding noted. No fevers. Felt a little too weak for PT this AM, but agrees to work with them now.   Objective: Vitals:   12/19/16 2114 12/20/16 0606 12/20/16 0916 12/20/16 1402  BP: (!) 154/87 (!) 156/77 (!) 148/74 (!) 142/83  Pulse: (!) 57 72 80 61  Resp: 18 18  18   Temp: 97.8 F (36.6 C) 98.4 F (36.9 C)  (!) 97.5 F (36.4 C)  TempSrc: Oral Oral  Oral  SpO2: 94% 94%  95%  Weight:      Height:        Intake/Output Summary (Last 24 hours) at 12/20/16 1528 Last data filed at 12/20/16 0900  Gross per 24 hour  Intake              580 ml  Output             1500 ml  Net             -920 ml   Filed Weights   12/18/16 2300  Weight: 68.1 kg (150 lb 3.2 oz)    Gen: Pleasant, loquacious 76 y.o. male in no distress Pulm: Non-labored breathing room air. Clear to auscultation bilaterally.  CV: Regular rate and rhythm. No murmur, rub, or gallop. No JVD, no pedal edema. GI: Abdomen soft, mild suprapubic tenderness, non-distended, with normoactive bowel sounds. No CVA tenderness.  Ext: Warm, no deformities GU: Urethral meatus normal with foley draining clear/light yellow urine.  Neuro: Alert and oriented. No focal neurological deficits. Mild asterixis in right upper extremity.   CBC:  Recent Labs Lab 12/18/16 1615 12/19/16 0432 12/20/16 0457  WBC 19.9* 17.9* 9.4  HGB 13.5 11.1* 12.0*  HCT 39.5 33.3* 36.0*  MCV 93.6 92.8 93.3  PLT 223 189 253   Basic Metabolic Panel:  Recent Labs Lab 12/18/16 1615 12/19/16 0432 12/20/16 0457  NA 144 141 145  K 5.4* 4.8 3.8  CL 111 109 108    CO2 19* 19* 20*  GLUCOSE 145* 136* 97  BUN 85* 82* 96*  CREATININE 6.61* 6.33* 5.81*  CALCIUM 10.0 9.0 9.0   GFR: Estimated Creatinine Clearance: 10.6 mL/min (A) (by C-G formula based on SCr of 5.81 mg/dL (H)). Liver Function Tests:  Recent Labs Lab 12/18/16 1615 12/19/16 0432  AST 16 12*  ALT 14* 11*  ALKPHOS 107 87  BILITOT 1.7* 1.5*  PROT 7.8 6.8  ALBUMIN 4.3 3.5   Cardiac Enzymes:  Recent Labs Lab 12/18/16 1615 12/19/16 0432  TROPONINI 0.07* 0.08*   Urine analysis:    Component Value Date/Time   COLORURINE YELLOW 12/18/2016 1627   APPEARANCEUR CLEAR 12/18/2016 1627   LABSPEC 1.009 12/18/2016 1627   PHURINE 5.0 12/18/2016 1627   GLUCOSEU NEGATIVE 12/18/2016 1627   HGBUR NEGATIVE 12/18/2016 1627   [place] 12/18/2016 1627   BILIRUBINUR negative 06/12/2016 1146   [place] 12/18/2016 1627   PROTEINUR NEGATIVE 12/18/2016 1627   UROBILINOGEN 1.0 06/12/2016 1146   NITRITE NEGATIVE 12/18/2016 1627   LEUKOCYTESUR SMALL (A)  12/18/2016 1627   Recent Results (from the past 240 hour(s))  Culture, blood (routine x 2)     Status: Abnormal (Preliminary result)   Collection Time: 12/18/16  6:09 PM  Result Value Ref Range Status   Specimen Description BLOOD RIGHT WRIST  Final   Special Requests   Final    BOTTLES DRAWN AEROBIC AND ANAEROBIC Blood Culture adequate volume   Culture  Setup Time   Final    GRAM POSITIVE COCCI IN CHAINS AEROBIC BOTTLE ONLY CRITICAL RESULT CALLED TO, READ BACK BY AND VERIFIED WITH: E. [place], RPHARMD ([place]) AT 0109 ON 12/19/16 BY C. JESSUP, MLT. Performed at [facility], [place] [address], [place], [place] [address]    Culture ENTEROCOCCUS FAECALIS (A)  Final   Report Status PENDING  Incomplete  Blood Culture ID Panel (Reflexed)     Status: Abnormal   Collection Time: 12/18/16  6:09 PM  Result Value Ref Range Status   Enterococcus species DETECTED (A) NOT DETECTED Final    Comment: CRITICAL RESULT CALLED TO,  READ BACK BY AND VERIFIED WITH: E, WILLIAMSON, RPHARMD ([place]) AT 1439 ON 12/19/16 BY C. JESSUP, MLT.    Vancomycin resistance NOT DETECTED NOT DETECTED Final   Listeria monocytogenes NOT DETECTED NOT DETECTED Final   Staphylococcus species NOT DETECTED NOT DETECTED Final   Staphylococcus aureus NOT DETECTED NOT DETECTED Final   Streptococcus species NOT DETECTED NOT DETECTED Final   Streptococcus agalactiae NOT DETECTED NOT DETECTED Final   Streptococcus pneumoniae NOT DETECTED NOT DETECTED Final   Streptococcus pyogenes NOT DETECTED NOT DETECTED Final   Acinetobacter baumannii NOT DETECTED NOT DETECTED Final   Enterobacteriaceae species NOT DETECTED NOT DETECTED Final   Enterobacter cloacae complex NOT DETECTED NOT DETECTED Final   Escherichia coli NOT DETECTED NOT DETECTED Final   Klebsiella oxytoca NOT DETECTED NOT DETECTED Final   Klebsiella pneumoniae NOT DETECTED NOT DETECTED Final   Proteus species NOT DETECTED NOT DETECTED Final   Serratia marcescens NOT DETECTED NOT DETECTED Final   Haemophilus influenzae NOT DETECTED NOT DETECTED Final   Neisseria meningitidis NOT DETECTED NOT DETECTED Final   Pseudomonas aeruginosa NOT DETECTED NOT DETECTED Final   Candida albicans NOT DETECTED NOT DETECTED Final   Candida glabrata NOT DETECTED NOT DETECTED Final   Candida krusei NOT DETECTED NOT DETECTED Final   Candida parapsilosis NOT DETECTED NOT DETECTED Final   Candida tropicalis NOT DETECTED NOT DETECTED Final    Comment: Performed at [facility], [place] [address] [place], [place] [address]  Urine culture     Status: Abnormal (Preliminary result)   Collection Time: 12/18/16  6:49 PM  Result Value Ref Range Status   Specimen Description URINE, CLEAN CATCH  Final   Special Requests NONE  Final   Culture (A)  Final    80,000 COLONIES/mL ENTEROCOCCUS FAECALIS 20,000 COLONIES/mL STAPHYLOCOCCUS HAEMOLYTICUS    Report Status PENDING  Incomplete  Culture, blood (routine x 2)      Status: None (Preliminary result)   Collection Time: 12/18/16  9:46 PM  Result Value Ref Range Status   Specimen Description BLOOD LEFT ARM  Final   Special Requests IN PEDIATRIC BOTTLE Blood Culture adequate volume  Final   Culture   Final    NO GROWTH 2 DAYS Performed at [facility], [place] [address], [place], [place] [address]    Report Status PENDING  Incomplete      Radiology Studies: Dg Chest 2 View  Result Date:  12/18/2016 CLINICAL DATA:  Weight loss and fatigue EXAM: CHEST  2 VIEW COMPARISON:  June 12, 2016 FINDINGS: On the frontal view, there is a 2 x 2 cm nodular opacity in the right lower lobe region. There is atelectatic change in the left base. The lungs elsewhere clear. Heart is mildly enlarged with pulmonary vascularity within normal limits. No evident adenopathy. There is aortic atherosclerosis. No evident bone lesions. IMPRESSION: 2 x 2 cm nodular opacity right lower lobe medially seen only on the frontal view. Advise noncontrast enhanced chest CT to further assess. Atelectasis left base.  No edema or consolidation. Stable cardiac silhouette.  There is aortic atherosclerosis. Aortic Atherosclerosis (ICD10-I70.0). Electronically Signed   By: Lowella Grip III M.D.   On: 12/18/2016 17:16    Scheduled Meds: . baclofen  5 mg Oral TID  . chlorhexidine  15 mL Mouth Rinse BID  . heparin  5,000 Units Subcutaneous Q8H  . hydrALAZINE  10 mg Oral Q8H  . mouth rinse  15 mL Mouth Rinse q12n4p  . metoprolol tartrate  5 mg Intravenous Once  . metoprolol tartrate  12.5 mg Oral BID   Continuous Infusions: . ampicillin (OMNIPEN) IV Stopped (12/20/16 0555)     LOS: 2 days   Time spent: 25 minutes.  Vance Gather, MD [facility] Pager [phone]  If 7PM-7AM, please contact night-coverage [email or web address] Password [email or web address] 12/20/2016, 3:28 PM

## 2016-12-20 NOTE — Evaluation (Signed)
Physical Therapy Evaluation Patient Details Name: Stephen Copeland MRN: [id number] DOB: 1941-02-16 Today's Date: 12/20/2016   History of Present Illness  Stephen Copeland is a 76 y.o. male with medical history significant of GERD, hiatal hernia, folliculitis, hyperlipidemia, hypertension,  Nummular dermatitis, scoliosis, sinus bradycardia who is coming in to the emergency department 12/18/16 with complaints of 2 months of not feeling well, intermittent abdominal pain, loss of appetite, weight loss.  patient found to have urinary retension,.  Clinical Impression  The patient  Tolerated ambulating with RW x 330'. Cues for safety due to distracting with conversation continuous. BP sitting 164/98.  The patient  Lives with wife and plans to return home. Pt admitted with above diagnosis. Pt currently with functional limitations due to the deficits listed below (see PT Problem List). Pt will benefit from skilled PT to increase their independence and safety with mobility to allow discharge to the venue listed below.       Follow Up Recommendations Home health PT;Supervision/Assistance - 24 hour    Equipment Recommendations  None recommended by PT    Recommendations for Other Services       Precautions / Restrictions Precautions Precautions: Fall Restrictions Weight Bearing Restrictions: No      Mobility  Bed Mobility Overal bed mobility: Independent                Transfers Overall transfer level: Needs assistance Equipment used: Rolling walker (2 wheeled) Transfers: Sit to/from Stand Sit to Stand: Min guard         General transfer comment: from bed, cues for safety  Ambulation/Gait Ambulation/Gait assistance: Min assist;Min guard Ambulation Distance (Feet): 300 Feet Assistive device: Rolling walker (2 wheeled) Gait Pattern/deviations: Step-to pattern;Step-through pattern   Gait velocity interpretation: Below normal speed for age/gender General Gait Details: the patient  ambulates slowly, distracted  with talking constantly. cus for safety   with RW, tio not let go of the RW.  Stairs            Wheelchair Mobility    Modified Rankin (Stroke Patients Only)       Balance Overall balance assessment: Needs assistance Sitting-balance support: No upper extremity supported;Feet supported Sitting balance-Leahy Scale: Good     Standing balance support: During functional activity;No upper extremity supported Standing balance-Leahy Scale: Fair                               Pertinent Vitals/Pain Pain Assessment: No/denies pain    Home Living Family/patient expects to be discharged to:: Private residence Living Arrangements: Spouse/significant other Available Help at Discharge: Family;Available 24 hours/day Type of Home: House Home Access: Stairs to enter   [employer] of Steps: 1-2 Home Layout: One level Home Equipment: Walker - 2 wheels Additional Comments: pt indicates his wife is older than him and that she uses a RW.      Prior Function Level of Independence: Independent               Hand Dominance        Extremity/Trunk Assessment   Upper Extremity Assessment Upper Extremity Assessment: RUE deficits/detail RUE Deficits / Details: noted intension tremors at times    Lower Extremity Assessment Lower Extremity Assessment: Generalized weakness    Cervical / Trunk Assessment Cervical / Trunk Assessment: Kyphotic  Communication   Communication: No difficulties  Cognition Arousal/Alertness: Awake/alert Behavior During Therapy: WFL for tasks assessed/performed Overall Cognitive Status: Within Functional Limits for  tasks assessed                                 General Comments: very talkative and expalins everything along the way, perseverates on his  former work.      General Comments      Exercises     Assessment/Plan    PT Assessment Patient needs continued PT services   PT Problem List Decreased strength;Decreased activity tolerance;Decreased mobility;Decreased balance;Decreased knowledge of precautions;Decreased safety awareness;Decreased knowledge of use of DME       PT Treatment Interventions DME instruction;Gait training;Functional mobility training;Therapeutic activities;Therapeutic exercise    PT Goals (Current goals can be found in the Care Plan section)  Acute Rehab PT Goals Patient Stated Goal: to go home, drive again PT Goal Formulation: With patient Time For Goal Achievement: 01/03/17 Potential to Achieve Goals: Good    Frequency Min 3X/week   Barriers to discharge        Co-evaluation               AM-PAC PT "6 Clicks" Daily Activity  Outcome Measure Difficulty turning over in bed (including adjusting bedclothes, sheets and blankets)?: None Difficulty moving from lying on back to sitting on the side of the bed? : None Difficulty sitting down on and standing up from a chair with arms (e.g., wheelchair, bedside commode, etc,.)?: A Little Help needed moving to and from a bed to chair (including a wheelchair)?: A Little Help needed walking in hospital room?: A Lot Help needed climbing 3-5 steps with a railing? : A Lot 6 Click Score: 18    End of Session Equipment Utilized During Treatment: Gait belt Activity Tolerance: Patient tolerated treatment well Patient left: in chair;with call bell/phone within reach Nurse Communication: Mobility status PT Visit Diagnosis: Unsteadiness on feet (R26.81)    Time: 1521-1600 PT Time Calculation (min) (ACUTE ONLY): 39 min   Charges:   PT Evaluation $PT Eval Low Complexity: 1 Low PT Treatments $Gait Training: 23-37 mins   PT G CodesTresa Endo PT [phone]   Claretha Cooper 12/20/2016, 5:05 PM

## 2016-12-21 LAB — URINE CULTURE

## 2016-12-21 LAB — CULTURE, BLOOD (ROUTINE X 2): SPECIAL REQUESTS: ADEQUATE

## 2016-12-21 LAB — BASIC METABOLIC PANEL
Anion gap: 13 (ref 5–15)
BUN: 87 mg/dL — AB (ref 6–20)
CO2: 24 mmol/L (ref 22–32)
CREATININE: 4.55 mg/dL — AB (ref 0.61–1.24)
Calcium: 8.9 mg/dL (ref 8.9–10.3)
Chloride: 107 mmol/L (ref 101–111)
GFR calc non Af Amer: 11 mL/min — ABNORMAL LOW (ref >60)
GFR, EST AFRICAN AMERICAN: 13 mL/min — AB (ref >60)
Glucose, Bld: 93 mg/dL (ref 65–99)
Potassium: 3.3 mmol/L — ABNORMAL LOW (ref 3.5–5.1)
Sodium: 144 mmol/L (ref 135–145)

## 2016-12-21 MED ORDER — SODIUM CHLORIDE 0.9 % IV SOLN
2.0000 g | INTRAVENOUS | Status: AC
  Administered 2016-12-21: 2 g via INTRAVENOUS
  Filled 2016-12-21: qty 2000

## 2016-12-21 MED ORDER — SODIUM CHLORIDE 0.9% FLUSH: 10.0000 mL | INTRAVENOUS | Status: DC | PRN

## 2016-12-21 MED ORDER — SODIUM CHLORIDE 0.9 % IV SOLN
2.0000 g | Freq: Three times a day (TID) | INTRAVENOUS | Status: DC
  Administered 2016-12-21 – 2016-12-22 (×4): 2 g via INTRAVENOUS
  Filled 2016-12-21 (×5): qty 2000

## 2016-12-21 NOTE — Progress Notes (Addendum)
INFECTIOUS DISEASE PROGRESS NOTE  ID: Stephen Copeland is a 76 y.o. male with  Principal Problem:   Acute renal failure (ARF) ([place]) Active Problems:   Atrial fibrillation (H[facility])   Hypertension   Elevated troponin   Chronic systolic CHF (congestive heart failure) ([place])   Acute urinary retention   Generalized weakness   Hyperkalemia   Hyperbilirubinemia   Right lower lobe pulmonary nodule   Noncompliance with treatment plan   Bacteremia due to Enterococcus   Enterococcus UTI  Subjective: C/o L ankle pain  Abtx:  Anti-infectives    Start     Dose/Rate Route Frequency Ordered Stop   12/19/16 1800  cefTRIAXone (ROCEPHIN) 1 g in dextrose 5 % 50 mL IVPB  Status:  Discontinued     1 g 100 mL/hr over 30 Minutes Intravenous Every 24 hours 12/19/16 0253 12/19/16 1508   12/19/16 1600  ampicillin (OMNIPEN) 2 g in sodium chloride 0.9 % 50 mL IVPB     2 g 150 mL/hr over 20 Minutes Intravenous Every 12 hours 12/19/16 1508     12/18/16 1845  cefTRIAXone (ROCEPHIN) 1 g in dextrose 5 % 50 mL IVPB     1 g 100 mL/hr over 30 Minutes Intravenous  Once 12/18/16 1830 12/18/16 2004      Medications:  Scheduled: . baclofen  5 mg Oral TID  . chlorhexidine  15 mL Mouth Rinse BID  . heparin  5,000 Units Subcutaneous Q8H  . hydrALAZINE  10 mg Oral Q8H  . mouth rinse  15 mL Mouth Rinse q12n4p  . metoprolol tartrate  5 mg Intravenous Once  . metoprolol tartrate  12.5 mg Oral BID    Objective: Vital signs in last 24 hours: Temp:  [97.5 F (36.4 C)-97.6 F (36.4 C)] 97.6 F (36.4 C) (10/19 0455) Pulse Rate:  [61-69] 64 (10/19 0455) Resp:  [17-18] 17 (10/19 0455) BP: (142-160)/(83-85) 160/85 (10/19 0455) SpO2:  [95 %-99 %] 95 % (10/19 0455)   General appearance: alert, cooperative and no distress Resp: clear to auscultation bilaterally Cardio: regular rate and rhythm GI: normal findings: bowel sounds normal and soft, non-tender Extremities: L ankle tender with movement. no heat, no  swelling.   Lab Results  Recent Labs  12/19/16 0432 12/20/16 0457 12/21/16 0433  WBC 17.9* 9.4  --   HGB 11.1* 12.0*  --   HCT 33.3* 36.0*  --   NA 141 145 144  K 4.8 3.8 3.3*  CL 109 108 107  CO2 19* 20* 24  BUN 82* 96* 87*  CREATININE 6.33* 5.81* 4.55*   Liver Panel  Recent Labs  12/18/16 1615 12/19/16 0432  PROT 7.8 6.8  ALBUMIN 4.3 3.5  AST 16 12*  ALT 14* 11*  ALKPHOS 107 87  BILITOT 1.7* 1.5*   Sedimentation Rate No results for input(s): ESRSEDRATE in the last 72 hours. C-Reactive Protein No results for input(s): CRP in the last 72 hours.  Microbiology: Recent Results (from the past 240 hour(s))  Culture, blood (routine x 2)     Status: Abnormal   Collection Time: 12/18/16  6:09 PM  Result Value Ref Range Status   Specimen Description BLOOD RIGHT WRIST  Final   Special Requests   Final    BOTTLES DRAWN AEROBIC AND ANAEROBIC Blood Culture adequate volume   Culture  Setup Time   Final    GRAM POSITIVE COCCI IN CHAINS AEROBIC BOTTLE ONLY CRITICAL RESULT CALLED TO, READ BACK BY AND VERIFIED WITH: Cristopher Estimable, [place] (  [place]) AT 1439 ON 12/19/16 BY C. JESSUP, MLT. Performed at [facility], [place] [address], [place], [place] [address]    Culture ENTEROCOCCUS FAECALIS (A)  Final   Report Status 12/21/2016 FINAL  Final   Organism ID, Bacteria ENTEROCOCCUS FAECALIS  Final      Susceptibility   Enterococcus faecalis - MIC*    AMPICILLIN <=2 SENSITIVE Sensitive     VANCOMYCIN 1 SENSITIVE Sensitive     GENTAMICIN SYNERGY SENSITIVE Sensitive     * ENTEROCOCCUS FAECALIS  Blood Culture ID Panel (Reflexed)     Status: Abnormal   Collection Time: 12/18/16  6:09 PM  Result Value Ref Range Status   Enterococcus species DETECTED (A) NOT DETECTED Final    Comment: CRITICAL RESULT CALLED TO, READ BACK BY AND VERIFIED WITH: E, WILLIAMSON, RPHARMD ([place]) AT 1439 ON 12/19/16 BY C. JESSUP, MLT.    Vancomycin resistance NOT DETECTED NOT DETECTED Final   Listeria  monocytogenes NOT DETECTED NOT DETECTED Final   Staphylococcus species NOT DETECTED NOT DETECTED Final   Staphylococcus aureus NOT DETECTED NOT DETECTED Final   Streptococcus species NOT DETECTED NOT DETECTED Final   Streptococcus agalactiae NOT DETECTED NOT DETECTED Final   Streptococcus pneumoniae NOT DETECTED NOT DETECTED Final   Streptococcus pyogenes NOT DETECTED NOT DETECTED Final   Acinetobacter baumannii NOT DETECTED NOT DETECTED Final   Enterobacteriaceae species NOT DETECTED NOT DETECTED Final   Enterobacter cloacae complex NOT DETECTED NOT DETECTED Final   Escherichia coli NOT DETECTED NOT DETECTED Final   Klebsiella oxytoca NOT DETECTED NOT DETECTED Final   Klebsiella pneumoniae NOT DETECTED NOT DETECTED Final   Proteus species NOT DETECTED NOT DETECTED Final   Serratia marcescens NOT DETECTED NOT DETECTED Final   Haemophilus influenzae NOT DETECTED NOT DETECTED Final   Neisseria meningitidis NOT DETECTED NOT DETECTED Final   Pseudomonas aeruginosa NOT DETECTED NOT DETECTED Final   Candida albicans NOT DETECTED NOT DETECTED Final   Candida glabrata NOT DETECTED NOT DETECTED Final   Candida krusei NOT DETECTED NOT DETECTED Final   Candida parapsilosis NOT DETECTED NOT DETECTED Final   Candida tropicalis NOT DETECTED NOT DETECTED Final    Comment: Performed at [facility], [place] [address] [place], [place] [address]  Urine culture     Status: Abnormal   Collection Time: 12/18/16  6:49 PM  Result Value Ref Range Status   Specimen Description URINE, CLEAN CATCH  Final   Special Requests NONE  Final   Culture (A)  Final    80,000 COLONIES/mL ENTEROCOCCUS FAECALIS 20,000 COLONIES/mL STAPHYLOCOCCUS HAEMOLYTICUS    Report Status 12/21/2016 FINAL  Final   Organism ID, Bacteria ENTEROCOCCUS FAECALIS (A)  Final   Organism ID, Bacteria STAPHYLOCOCCUS HAEMOLYTICUS (A)  Final      Susceptibility   Enterococcus faecalis - MIC*    AMPICILLIN <=2 SENSITIVE Sensitive      LEVOFLOXACIN 1 SENSITIVE Sensitive     NITROFURANTOIN <=16 SENSITIVE Sensitive     VANCOMYCIN 1 SENSITIVE Sensitive     * 80,000 COLONIES/mL ENTEROCOCCUS FAECALIS   Staphylococcus haemolyticus - MIC*    CIPROFLOXACIN <=0.5 SENSITIVE Sensitive     GENTAMICIN <=0.5 SENSITIVE Sensitive     NITROFURANTOIN <=16 SENSITIVE Sensitive     OXACILLIN <=0.25 SENSITIVE Sensitive     TETRACYCLINE >=16 RESISTANT Resistant     VANCOMYCIN <=0.5 SENSITIVE Sensitive     TRIMETH/SULFA <=10 SENSITIVE Sensitive     CLINDAMYCIN <=0.25 SENSITIVE Sensitive     RIFAMPIN <=0.5  SENSITIVE Sensitive     Inducible Clindamycin NEGATIVE Sensitive     * 20,000 COLONIES/mL STAPHYLOCOCCUS HAEMOLYTICUS  Culture, blood (routine x 2)     Status: None (Preliminary result)   Collection Time: 12/18/16  9:46 PM  Result Value Ref Range Status   Specimen Description BLOOD LEFT ARM  Final   Special Requests IN PEDIATRIC BOTTLE Blood Culture adequate volume  Final   Culture   Final    NO GROWTH 2 DAYS Performed at [facility], [place] [address], [place],  [address]    Report Status PENDING  Incomplete    Studies/Results: No results found.   Assessment/Plan: Enterococcal bacteremia UTI Urinary obstruction L ankle pain ARF  Total days of antibiotics: 3 ceftriaxone --> ampicillin  Repeat BCx from 10-17 are ngtd As long as these remain (-) would defer TEE and give him 2 weeks of ampicillin.  If he is unable to have IV rx at home, could change him to PO amoxil  His L ankle does not appear infected. He has previously had steroid injection though unclear as to why (he denies gout). Would watch, consider aspirate, mri if persistent? Cr continues to slowly improve.  Available as needed.          Bobby Rumpf MD, FACP Infectious Diseases (pager) [phone] [email or web address] 12/21/2016, 1:19 PM  LOS: 3 days

## 2016-12-21 NOTE — Progress Notes (Addendum)
PROGRESS NOTE  Stephen Copeland  PRF:[id number] DOB: Dec 31, 1940 DOA: 12/18/2016 PCP: Patient, No Pcp Per  Outpatient Specialists: [facility] Brief Narrative: Stephen Copeland is a 76 y.o. male with medical history significant of HTN, HLD, BPH, nummular dermatitis, scoliosis, sinus bradycardia who presented to the ED for 2 months of intermittent abdominal pain, loss of appetite, weight loss, change in taste and most recently weakness. He had been having severe difficulty with urination. He was afebrile with +UA findings and urinary retention. WBC 19.9, SCr 6.61, BUN 85, K 5.4, bicarbonate 19. Urology was consulted, though the patient declined their recommendation of foley catheterization initially. He was admitted, started on ceftriaxone, and finally consented to catheterization 10/17. Blood cultures have grown Enterococcus.   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  - Continue foley catheterization. No plans for voiding trial prior to follow up with [facility]. Home care aware and will assist with care.  - Monitor BMP in AM.  - Liberalize diet to include no fluid restriction.   Hyperkalemia: Resolved w/kayexalate.  - Monitor  Enterococcus UTI and bacteremia: Appreciate ID involvement.  - Continue ampicillin 2g q8h (renal dosing per pharmacy recommendations), monitor dose with anticipated improving renal function. Discussed with pt and Home Care representative. Pt feels his wife would be able to administer abx at home.  - If  home IV antibiotics not available option, would resort to po amoxicillin per ID recommendations.  - No vegetations on echo 10/18, if not persistently bacteremic, would not require TEE.  Paroxysmal atrial fibrillation: CHA2DS2-VASc Scoreis 4 - Continue metoprolol for rate control.  - On eliquis at home, which would be replaced while inpatient, though the patient declines anticoagulation at this time.    Essential HTN: Chronic, stable - Continue metoprolol as above and hydralazine.   Chronic combined systolic and diastolic CHF: No evidence of exacerbation. Echo repeated for vegetation r/o, showing stable EF 35% w/G1DD (was G2DD).   - Continue metoprolol and hydralazine. Not and ACE/ARB for unclear reasons, though will not start now with renal failure.   Hyperbilirubinemia: Improving, suspect due to sepsis and renal failure.  - Monitor  Right lower lobe pulmonary nodule: 2 x 2 cm nodular opacity right lower lobe medially seen only on the frontal view on CXR 10/16.  - Plan chest CT to further assess on nonurgent basis.   Severe protein calorie malnutrition:  - Nutrition consulted, encourage po  DVT prophylaxis: Subcutaneous heparin Code Status: Full Family Communication: None at bedside Disposition Plan: Anticipate DC home with midline IV and 2 total weeks home IV ampicillin on 10/20.  Consultants:   Urology, Dr. Lovena Neighbours  ID, Dr. Johnnye Sima  Procedures:   Foley 10/17 >>   Echocardiogram 12/20/2016: Study Conclusions - Left ventricle: The cavity size was normal. There was mild   concentric hypertrophy. Systolic function was moderately to   severely reduced. The estimated ejection fraction was in the   range of 30% to 35%. Wall motion was normal; there were no   regional wall motion abnormalities. Doppler parameters  are   consistent with abnormal left ventricular relaxation (grade 1   diastolic dysfunction). Doppler parameters are consistent with   elevated ventricular  end-diastolic filling pressure. - Aortic valve: There was no regurgitation. - Mitral valve: There was mild regurgitation. - Left atrium: The atrium was mildly dilated. - Right ventricle: Systolic function was normal. - Right atrium: The atrium was normal in size. - Tricuspid valve: There was mild regurgitation. - Inferior vena cava: The vessel was normal in size. - Pericardium, extracardiac: A mild anteriorly located pericardial   effusion was identified. Features were not consistent with   tamponade physiology.  Impressions: - There was no evidence of a vegetation.  Antimicrobials:  Ceftriaxone 10/16 - 10/17  Ampicillin 10/17 >>    Subjective: Woke up noticing 4/10 intermittent left ankle pain worse with movement, attributes to heavy covers. This has improved spontaneously through the day. No fevers.   Objective: Vitals:   12/20/16 0916 12/20/16 1402 12/20/16 2128 12/21/16 0455  BP: (!) 148/74 (!) 142/83 (!) 157/84 (!) 160/85  Pulse: 80 61 69 64  Resp:  18 17 17   Temp:  (!) 97.5 F (36.4 C) 97.6 F (36.4 C) 97.6 F (36.4 C)  TempSrc:  Oral Oral Axillary  SpO2:  95% 99% 95%  Weight:      Height:        Intake/Output Summary (Last 24 hours) at 12/21/16 1444 Last data filed at 12/21/16 1440  Gross per 24 hour  Intake                0 ml  Output             3275 ml  Net            -3275 ml   Filed Weights   12/18/16 2300  Weight: 68.1 kg (150 lb 3.2 oz)    Gen: Pleasant, loquacious 76 y.o. male in no distress Pulm: Non-labored, clear CV: RRR, no murmur or JVD GI: Abdomen soft, mild suprapubic tenderness, non-distended, with normoactive bowel sounds. No CVA tenderness.  Ext: Warm, no deformities. Left ankle unremarkable upon inspection and palpation. No erythema or warmth or effusion.  GU: Urethral meatus normal with foley draining clear/light yellow urine.  Neuro: Alert and oriented. No focal neurological deficits. No asterixis  CBC:  Recent Labs Lab  12/18/16 1615 12/19/16 0432 12/20/16 0457  WBC 19.9* 17.9* 9.4  HGB 13.5 11.1* 12.0*  HCT 39.5 33.3* 36.0*  MCV 93.6 92.8 93.3  PLT 223 189 638   Basic Metabolic Panel:  Recent Labs Lab 12/18/16 1615 12/19/16 0432 12/20/16 0457 12/21/16 0433  NA 144 141 145 144  K 5.4* 4.8 3.8 3.3*  CL 111 109 108 107  CO2 19* 19* 20* 24  GLUCOSE 145* 136* 97 93  BUN 85* 82* 96* 87*  CREATININE 6.61* 6.33* 5.81* 4.55*  CALCIUM 10.0 9.0 9.0 8.9   GFR: Estimated Creatinine Clearance: 13.5 mL/min (A) (by C-G formula based on SCr of 4.55 mg/dL (H)). Liver Function Tests:  Recent Labs Lab 12/18/16 1615 12/19/16 0432  AST 16 12*  ALT 14* 11*  ALKPHOS 107 87  BILITOT 1.7* 1.5*  PROT 7.8 6.8  ALBUMIN 4.3 3.5   Cardiac Enzymes:  Recent Labs Lab 12/18/16 1615 12/19/16 0432  TROPONINI 0.07* 0.08*   Urine analysis:    Component Value Date/Time   COLORURINE YELLOW 12/18/2016 1627   APPEARANCEUR CLEAR 12/18/2016 1627   LABSPEC 1.009 12/18/2016 1627   PHURINE 5.0 12/18/2016 1627  GLUCOSEU NEGATIVE 12/18/2016 1627   HGBUR NEGATIVE 12/18/2016 [place] 12/18/2016 1627   BILIRUBINUR negative 06/12/2016 1146   [place] 12/18/2016 1627   PROTEINUR NEGATIVE 12/18/2016 1627   UROBILINOGEN 1.0 06/12/2016 1146   NITRITE NEGATIVE 12/18/2016 1627   LEUKOCYTESUR SMALL (A) 12/18/2016 1627   Recent Results (from the past 240 hour(s))  Culture, blood (routine x 2)     Status: Abnormal   Collection Time: 12/18/16  6:09 PM  Result Value Ref Range Status   Specimen Description BLOOD RIGHT WRIST  Final   Special Requests   Final    BOTTLES DRAWN AEROBIC AND ANAEROBIC Blood Culture adequate volume   Culture  Setup Time   Final    GRAM POSITIVE COCCI IN CHAINS AEROBIC BOTTLE ONLY CRITICAL RESULT CALLED TO, READ BACK BY AND VERIFIED WITH: E. WILLIAMSON, RPHARMD ([place]) AT 9326 ON 12/19/16 BY C. JESSUP, MLT. Performed at [facility], [place] [address],  [place], [place] [address]    Culture ENTEROCOCCUS FAECALIS (A)  Final   Report Status 12/21/2016 FINAL  Final   Organism ID, Bacteria ENTEROCOCCUS FAECALIS  Final      Susceptibility   Enterococcus faecalis - MIC*    AMPICILLIN <=2 SENSITIVE Sensitive     VANCOMYCIN 1 SENSITIVE Sensitive     GENTAMICIN SYNERGY SENSITIVE Sensitive     * ENTEROCOCCUS FAECALIS  Blood Culture ID Panel (Reflexed)     Status: Abnormal   Collection Time: 12/18/16  6:09 PM  Result Value Ref Range Status   Enterococcus species DETECTED (A) NOT DETECTED Final    Comment: CRITICAL RESULT CALLED TO, READ BACK BY AND VERIFIED WITH: E, WILLIAMSON, RPHARMD ([place]) AT 1439 ON 12/19/16 BY C. JESSUP, MLT.    Vancomycin resistance NOT DETECTED NOT DETECTED Final   Listeria monocytogenes NOT DETECTED NOT DETECTED Final   Staphylococcus species NOT DETECTED NOT DETECTED Final   Staphylococcus aureus NOT DETECTED NOT DETECTED Final   Streptococcus species NOT DETECTED NOT DETECTED Final   Streptococcus agalactiae NOT DETECTED NOT DETECTED Final   Streptococcus pneumoniae NOT DETECTED NOT DETECTED Final   Streptococcus pyogenes NOT DETECTED NOT DETECTED Final   Acinetobacter baumannii NOT DETECTED NOT DETECTED Final   Enterobacteriaceae species NOT DETECTED NOT DETECTED Final   Enterobacter cloacae complex NOT DETECTED NOT DETECTED Final   Escherichia coli NOT DETECTED NOT DETECTED Final   Klebsiella oxytoca NOT DETECTED NOT DETECTED Final   Klebsiella pneumoniae NOT DETECTED NOT DETECTED Final   Proteus species NOT DETECTED NOT DETECTED Final   Serratia marcescens NOT DETECTED NOT DETECTED Final   Haemophilus influenzae NOT DETECTED NOT DETECTED Final   Neisseria meningitidis NOT DETECTED NOT DETECTED Final   Pseudomonas aeruginosa NOT DETECTED NOT DETECTED Final   Candida albicans NOT DETECTED NOT DETECTED Final   Candida glabrata NOT DETECTED NOT DETECTED Final   Candida krusei NOT DETECTED NOT DETECTED Final   Candida  parapsilosis NOT DETECTED NOT DETECTED Final   Candida tropicalis NOT DETECTED NOT DETECTED Final    Comment: Performed at [facility], [place] [address] [place],  [address]  Urine culture     Status: Abnormal   Collection Time: 12/18/16  6:49 PM  Result Value Ref Range Status   Specimen Description URINE, CLEAN CATCH  Final   Special Requests NONE  Final   Culture (A)  Final    80,000 COLONIES/mL ENTEROCOCCUS FAECALIS 20,000 COLONIES/mL STAPHYLOCOCCUS HAEMOLYTICUS    Report Status 12/21/2016 FINAL  Final   Organism ID, Bacteria ENTEROCOCCUS FAECALIS (A)  Final   Organism ID, Bacteria STAPHYLOCOCCUS HAEMOLYTICUS (A)  Final      Susceptibility   Enterococcus faecalis - MIC*    AMPICILLIN <=2 SENSITIVE Sensitive     LEVOFLOXACIN 1 SENSITIVE Sensitive     NITROFURANTOIN <=16 SENSITIVE Sensitive     VANCOMYCIN 1 SENSITIVE Sensitive     * 80,000 COLONIES/mL ENTEROCOCCUS FAECALIS   Staphylococcus haemolyticus - MIC*    CIPROFLOXACIN <=0.5 SENSITIVE Sensitive     GENTAMICIN <=0.5 SENSITIVE Sensitive     NITROFURANTOIN <=16 SENSITIVE Sensitive     OXACILLIN <=0.25 SENSITIVE Sensitive     TETRACYCLINE >=16 RESISTANT Resistant     VANCOMYCIN <=0.5 SENSITIVE Sensitive     TRIMETH/SULFA <=10 SENSITIVE Sensitive     CLINDAMYCIN <=0.25 SENSITIVE Sensitive     RIFAMPIN <=0.5 SENSITIVE Sensitive     Inducible Clindamycin NEGATIVE Sensitive     * 20,000 COLONIES/mL STAPHYLOCOCCUS HAEMOLYTICUS  Culture, blood (routine x 2)     Status: None (Preliminary result)   Collection Time: 12/18/16  9:46 PM  Result Value Ref Range Status   Specimen Description BLOOD LEFT ARM  Final   Special Requests IN PEDIATRIC BOTTLE Blood Culture adequate volume  Final   Culture   Final    NO GROWTH 2 DAYS Performed at [facility], [address] [place], [place] [address]    Report Status PENDING  Incomplete      Radiology Studies: No results found.  Scheduled Meds: . baclofen  5 mg Oral  TID  . chlorhexidine  15 mL Mouth Rinse BID  . heparin  5,000 Units Subcutaneous Q8H  . hydrALAZINE  10 mg Oral Q8H  . mouth rinse  15 mL Mouth Rinse q12n4p  . metoprolol tartrate  5 mg Intravenous Once  . metoprolol tartrate  12.5 mg Oral BID   Continuous Infusions: . ampicillin (OMNIPEN) IV    . ampicillin (OMNIPEN) IV 2 g (12/21/16 1435)     LOS: 3 days   Time spent: 25 minutes.  Vance Gather, MD [facility] Pager [phone]  If 7PM-7AM, please contact night-coverage [email or web address] Password [email or web address] 12/21/2016, 2:44 PM

## 2016-12-21 NOTE — Progress Notes (Signed)
Referral for [facility] given to [place].  Will need HHRN/PT order and face to face.

## 2016-12-21 NOTE — Progress Notes (Signed)
PHARMACY NOTE:  ANTIMICROBIAL RENAL DOSAGE ADJUSTMENT  Current antimicrobial regimen includes a mismatch between antimicrobial dosage and estimated renal function.  As per policy approved by the Pharmacy & Therapeutics and Medical Executive Committees, the antimicrobial dosage will be adjusted accordingly.  Current antimicrobial dosage: Ampicillin 2g IV q12h  Indication: bacteremia, UTI  Renal Function:  Estimated Creatinine Clearance: 13.5 mL/min (A) (by C-G formula based on SCr of 4.55 mg/dL (H)). []      On intermittent HD, scheduled: []      On CRRT    Antimicrobial dosage has been changed to: Ampicillin 2g IV q8h   Thank you for allowing pharmacy to be a part of this patient's care.  Luiz Ochoa, [facility] 12/21/2016 2:20 PM

## 2016-12-21 NOTE — Care Management Important Message (Signed)
Important Message  Patient Details  Name: Waylyn Tenbrink MRN: [id number] Date of Birth: 10/14/1940   Medicare Important Message Given:  Yes    Kerin Salen 12/21/2016, 12:20 [place] Message  Patient Details  Name: Shariff Lasky MRN: [id number] Date of Birth: 17-May-1940   Medicare Important Message Given:  Yes    Kerin Salen 12/21/2016, 12:20 PM

## 2016-12-22 LAB — BASIC METABOLIC PANEL
ANION GAP: 11 (ref 5–15)
BUN: 77 mg/dL — ABNORMAL HIGH (ref 6–20)
CHLORIDE: 113 mmol/L — AB (ref 101–111)
CO2: 21 mmol/L — ABNORMAL LOW (ref 22–32)
Calcium: 8.4 mg/dL — ABNORMAL LOW (ref 8.9–10.3)
Creatinine, Ser: 3.43 mg/dL — ABNORMAL HIGH (ref 0.61–1.24)
GFR, EST AFRICAN AMERICAN: 19 mL/min — AB (ref >60)
GFR, EST NON AFRICAN AMERICAN: 16 mL/min — AB (ref >60)
Glucose, Bld: 119 mg/dL — ABNORMAL HIGH (ref 65–99)
POTASSIUM: 3.4 mmol/L — AB (ref 3.5–5.1)
SODIUM: 145 mmol/L (ref 135–145)

## 2016-12-22 MED ORDER — AMPICILLIN IV (FOR PTA / DISCHARGE USE ONLY): 2.0000 g | Freq: Three times a day (TID) | INTRAVENOUS | 0 refills | Status: DC

## 2016-12-22 NOTE — Discharge Summary (Signed)
Physician Discharge Summary  Stephen Copeland ZOX:[id number] DOB: [birth year] DOA: 12/18/2016  PCP: Ivar Drape, PA-C  Admit date: 12/18/2016 Discharge date: 12/23/2016  Admitted From: Home Disposition: Home   Recommendations for Outpatient Follow-up:  1. Follow up with urology in the next 1 - 2 weeks. Continue foley until that time.  2. Recheck BMP at follow up. Adjust antibiotic dosing frequency as indicated.  3. Continue ampicillin through 10/31 for bacteremia.  4. Recommend CT chest to evaluate right lower lobe pulmonary nodule seen on CXR.  Home Health: RN Equipment/Devices: Midline IV RUE placed 10/19 Discharge Condition: Stable CODE STATUS: Full Diet recommendation: Heart healthy  Brief/Interim Summary: Stephen Copeland a 76 y.o.malewith medical history significant of HTN, HLD, BPH, nummular dermatitis, scoliosis, sinus bradycardia who presented to the ED for 2 months of intermittent abdominal pain, loss of appetite, weight loss, change in taste and most recently weakness. He had been having severe difficulty with urination. He was afebrile with +UA findings and urinary retention. WBC 19.9, SCr 6.61, BUN 85, K 5.4, bicarbonate 19. Urology was consulted, though the patient declined their recommendation of foley catheterization initially. He was admitted, started on ceftriaxone, and finally consented to catheterization 10/17. Blood cultures have grown Enterococcus sensitive to ampicillin and he will continue this until 10/31 per ID recommendations.   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  - Continue foley catheterization. No plans for voiding trial prior to follow up with [facility]. Home care aware and will assist with care.  - Monitor BMP at follow up  Hyperkalemia: Resolved w/kayexalate.  - Monitor  Enterococcus UTI and bacteremia: Appreciate ID involvement.  - Continue ampicillin 2g q8h (renal dosing per pharmacy recommendations), monitor dose with anticipated improving renal function. Discussed with pt and Home Care representative. Pt feels his wife would be able to administer abx at home.  - If home IV antibiotics not available option, would resort to po amoxicillin per ID recommendations.  - No vegetations on echo 10/18, repeat blood cultures negative.   Paroxysmal atrial fibrillation: CHA2DS2-VASc Scoreis 4 - Continue metoprolol for rate control.  - On eliquis at home, which would be replaced while inpatient, though the patient declines anticoagulation at this time. He continues to refuse anticoagulation.    Essential HTN: Chronic, stable - Continue metoprolol as above and hydralazine.   Chronic combined systolic and diastolic CHF: No evidence of exacerbation. Echo repeated for vegetation r/o, showing stable EF 35% w/G1DD (was G2DD).   - Continue metoprolol and hydralazine. Not and ACE/ARB for unclear reasons, though will not start now with renal failure.   Hyperbilirubinemia: Improving, suspect due to sepsis and renal failure.  - Monitor  Right lower lobe pulmonary nodule: 2 x 2 cm nodular opacity right lower lobe medially seen only on the frontal view on CXR 10/16.  - Plan chest CT to further assess on nonurgent basis.   Severe protein calorie malnutrition:  - Nutrition consulted, encourage po  Discharge Instructions Discharge Instructions    Diet - low sodium heart healthy  Complete by:  As directed    Discharge instructions    Complete by:  As  directed    - Follow up with [facility] in the next 1 - 2 weeks.  - Follow up with your PCP in the next 1 - 2 weeks for recheck of labs.  - You will need to continue IV ampicillin every 8 hours at home through 10/31.   Home infusion instructions [facility] May follow [facility] Dosing Protocol; May administer Cathflo as needed to maintain patency of vascular access device.; Flushing of vascular access device: per [facility] Protocol: 0.9% NaCl pre/post medica...    Complete by:  As directed    Instructions:  May follow [place] Dosing Protocol   Instructions:  May administer Cathflo as needed to maintain patency of vascular access device.   Instructions:  Flushing of vascular access device: per [facility] Protocol: 0.9% NaCl pre/post medication administration and prn patency; Heparin 100 u/ml, 46m for implanted ports and Heparin 10u/ml, 592mfor all other central venous catheters.   Instructions:  May follow [facility] Anaphylaxis Protocol for First Dose Administration in the home: 0.9% NaCl at 25-50 ml/hr to maintain IV access for protocol meds. Epinephrine 0.3 ml IV/IM PRN and Benadryl 25-50 IV/IM PRN s/s of anaphylaxis.   Instructions:  [facility] Coordinator (RN) to assist per patient IV care needs in the home PRN.     Allergies as of 12/22/2016   No Known Allergies     Medication List    STOP taking these medications   apixaban 5 MG Tabs tablet Commonly known as:  ELIQUIS   furosemide 40 MG tablet Commonly known as:  LASIX     TAKE these medications   ampicillin IVPB Inject 2 g into the vein every 8 (eight) hours. Indication: bacteremia Last Day of Therapy:  10/31 Labs - Once weekly:  CBC/D and BMP, next on 10/22. Labs - Every other week:  ESR and CRP   hydrALAZINE 10 MG tablet Commonly known as:  APRESOLINE Take 1 tablet (10 mg total) by mouth every 8 (eight) hours.   metoprolol tartrate 25 MG tablet Commonly known as:  LOPRESSOR Take 0.5 tablets (12.5 mg  total) by mouth 2 (two) times daily.   nitroGLYCERIN 0.4 MG SL tablet Commonly known as:  NITROSTAT Place 1 tablet (0.4 mg total) under the tongue every 5 (five) minutes as needed for chest pain.            Home Infusion Instuctions        Start     Ordered   12/22/16 0000  Home infusion instructions [facility] May follow [facility] Protocol; May administer Cathflo as needed to maintain patency of vascular access device.; Flushing of vascular access device: per [facility]: 0.9% NaCl pre/post medica...    Question Answer Comment  Instructions May follow [facility] Protocol   Instructions May administer Cathflo as needed to maintain patency of vascular access device.   Instructions Flushing of vascular access device: [facility]: 0.9% NaCl pre/post medication administration and prn patency; Heparin 100 u/ml, 51m21mor implanted ports and Heparin 10u/ml, 51ml351mr all other central venous catheters.   Instructions May follow [facility] Anaphylaxis Protocol for First Dose Administration in the home: 0.9% NaCl at 25-50 ml/hr to maintain IV access for protocol meds. Epinephrine 0.3 ml IV/IM PRN and Benadryl 25-50 IV/IM PRN s/s of anaphylaxis.   Instructions [facility] Infusion Coordinator (RN) to  assist per patient IV care needs in the home PRN.      12/22/16 1544     Follow-up Information    Kathie Rhodes, MD Follow up in 2 week(s).   Specialty:  Urology Contact information: [address] [place] [address] [phone]        Health, [facility]-Home Follow up.   Why:  Home Health Physical Therapy and [place] RN-agency will call to arrange initial visit Contact information: [address] [place] [address] [phone]        Joretta [place], [place]. Schedule an appointment as soon as possible for a visit in 1 week(s).   Specialties:  Physician Assistant, Urgent Care Why:  with recheck of kidney function Contact  information: [place] [address] [phone]          No Known Allergies  Consultations:  ID, [place]  Urology, [place]  Procedures/Studies: Dg Chest 2 View  Result Date: 12/18/2016 CLINICAL DATA:  Weight loss and fatigue EXAM: CHEST  2 VIEW COMPARISON:  June 12, 2016 FINDINGS: On the frontal view, there is a 2 x 2 cm nodular opacity in the right lower lobe region. There is atelectatic change in the left base. The lungs elsewhere clear. Heart is mildly enlarged with pulmonary vascularity within normal limits. No evident adenopathy. There is aortic atherosclerosis. No evident bone lesions. IMPRESSION: 2 x 2 cm nodular opacity right lower lobe medially seen only on the frontal view. Advise noncontrast enhanced chest CT to further assess. Atelectasis left base.  No edema or consolidation. Stable cardiac silhouette.  There is aortic atherosclerosis. Aortic Atherosclerosis (ICD10-I70.0). Electronically Signed   By: Lowella Grip III M.D.   On: 12/18/2016 17:16   Subjective: Ankle pain better. No fevers.   Discharge Exam: Vitals:   12/22/16 0518 12/22/16 1351  BP: (!) 150/88   Pulse: 74 65  Resp: 16 18  Temp: 97.7 F (36.5 C) 97.6 F (36.4 C)  SpO2: 92% 96%   General: Pt is alert, awake, not in acute distress Cardiovascular: RRR, S1/S2 +, no rubs, no gallops Respiratory: CTA bilaterally, no wheezing, no rhonchi Abdominal: Soft, NT, ND, bowel sounds + Extremities: No edema, no cyanosis  Labs: BNP (last 3 results)  Recent Labs  06/12/16 1215  BNP 24.4   Basic Metabolic Panel:  Recent Labs Lab 12/18/16 1615 12/19/16 0432 12/20/16 0457 12/21/16 0433 12/22/16 0432  NA 144 141 145 144 145  K 5.4* 4.8 3.8 3.3* 3.4*  CL 111 109 108 107 113*  CO2 19* 19* 20* 24 21*  GLUCOSE 145* 136* 97 93 119*  BUN 85* 82* 96* 87* 77*  CREATININE 6.61* 6.33* 5.81* 4.55* 3.43*  CALCIUM 10.0 9.0 9.0 8.9 8.4*   Liver Function Tests:  Recent Labs Lab  12/18/16 1615 12/19/16 0432  AST 16 12*  ALT 14* 11*  ALKPHOS 107 87  BILITOT 1.7* 1.5*  PROT 7.8 6.8  ALBUMIN 4.3 3.5   No results for input(s): LIPASE, AMYLASE in the last 168 hours. No results for input(s): AMMONIA in the last 168 hours. CBC:  Recent Labs Lab 12/18/16 1615 12/19/16 0432 12/20/16 0457  WBC 19.9* 17.9* 9.4  HGB 13.5 11.1* 12.0*  HCT 39.5 33.3* 36.0*  MCV 93.6 92.8 93.3  PLT 223 189 203   Cardiac Enzymes:  Recent Labs Lab 12/18/16 1615 12/19/16 0432  TROPONINI 0.07* 0.08*   BNP: Invalid input(s): POCBNP CBG: No results for input(s): GLUCAP in the last 168  hours. D-Dimer No results for input(s): DDIMER in the last 72 hours. Hgb A1c No results for input(s): HGBA1C in the last 72 hours. Lipid Profile No results for input(s): CHOL, HDL, LDLCALC, TRIG, CHOLHDL, LDLDIRECT in the last 72 hours. Thyroid function studies No results for input(s): TSH, T4TOTAL, T3FREE, THYROIDAB in the last 72 hours.  Invalid input(s): FREET3 Anemia work up No results for input(s): VITAMINB12, FOLATE, FERRITIN, TIBC, IRON, RETICCTPCT in the last 72 hours. Urinalysis    Component Value Date/Time   COLORURINE YELLOW 12/18/2016 1627   APPEARANCEUR CLEAR 12/18/2016 1627   LABSPEC 1.009 12/18/2016 1627   PHURINE 5.0 12/18/2016 1627   GLUCOSEU NEGATIVE 12/18/2016 1627   HGBUR NEGATIVE 12/18/2016 [place] 12/18/2016 1627   BILIRUBINUR negative 06/12/2016 [place] 12/18/2016 1627   PROTEINUR NEGATIVE 12/18/2016 1627   UROBILINOGEN 1.0 06/12/2016 1146   NITRITE NEGATIVE 12/18/2016 1627   LEUKOCYTESUR SMALL (A) 12/18/2016 1627    Microbiology Recent Results (from the past 240 hour(s))  Culture, blood (routine x 2)     Status: Abnormal   Collection Time: 12/18/16  6:09 PM  Result Value Ref Range Status   Specimen Description BLOOD RIGHT WRIST  Final   Special Requests   Final    BOTTLES DRAWN AEROBIC AND ANAEROBIC Blood Culture  adequate volume   Culture  Setup Time   Final    GRAM POSITIVE COCCI IN CHAINS AEROBIC BOTTLE ONLY CRITICAL RESULT CALLED TO, READ BACK BY AND VERIFIED WITH: E. WILLIAMSON, RPHARMD ([place]) AT 9407 ON 12/19/16 BY C. JESSUP, MLT. Performed at [facility], [place] [address], [place], [place] [address]    Culture ENTEROCOCCUS FAECALIS (A)  Final   Report Status 12/21/2016 FINAL  Final   Organism ID, Bacteria ENTEROCOCCUS FAECALIS  Final      Susceptibility   Enterococcus faecalis - MIC*    AMPICILLIN <=2 SENSITIVE Sensitive     VANCOMYCIN 1 SENSITIVE Sensitive     GENTAMICIN SYNERGY SENSITIVE Sensitive     * ENTEROCOCCUS FAECALIS  Blood Culture ID Panel (Reflexed)     Status: Abnormal   Collection Time: 12/18/16  6:09 PM  Result Value Ref Range Status   Enterococcus species DETECTED (A) NOT DETECTED Final    Comment: CRITICAL RESULT CALLED TO, READ BACK BY AND VERIFIED WITH: E, WILLIAMSON, RPHARMD ([place]) AT 1439 ON 12/19/16 BY C. JESSUP, MLT.    Vancomycin resistance NOT DETECTED NOT DETECTED Final   Listeria monocytogenes NOT DETECTED NOT DETECTED Final   Staphylococcus species NOT DETECTED NOT DETECTED Final   Staphylococcus aureus NOT DETECTED NOT DETECTED Final   Streptococcus species NOT DETECTED NOT DETECTED Final   Streptococcus agalactiae NOT DETECTED NOT DETECTED Final   Streptococcus pneumoniae NOT DETECTED NOT DETECTED Final   Streptococcus pyogenes NOT DETECTED NOT DETECTED Final   Acinetobacter baumannii NOT DETECTED NOT DETECTED Final   Enterobacteriaceae species NOT DETECTED NOT DETECTED Final   Enterobacter cloacae complex NOT DETECTED NOT DETECTED Final   Escherichia coli NOT DETECTED NOT DETECTED Final   Klebsiella oxytoca NOT DETECTED NOT DETECTED Final   Klebsiella pneumoniae NOT DETECTED NOT DETECTED Final   Proteus species NOT DETECTED NOT DETECTED Final   Serratia marcescens NOT DETECTED NOT DETECTED Final   Haemophilus influenzae NOT DETECTED NOT DETECTED  Final   Neisseria meningitidis NOT DETECTED NOT DETECTED Final   Pseudomonas aeruginosa NOT DETECTED NOT DETECTED Final   Candida albicans NOT DETECTED NOT DETECTED Final  Candida glabrata NOT DETECTED NOT DETECTED Final   Candida krusei NOT DETECTED NOT DETECTED Final   Candida parapsilosis NOT DETECTED NOT DETECTED Final   Candida tropicalis NOT DETECTED NOT DETECTED Final    Comment: Performed at [facility], [place] [address], [place], [place] [address]  Urine culture     Status: Abnormal   Collection Time: 12/18/16  6:49 PM  Result Value Ref Range Status   Specimen Description URINE, CLEAN CATCH  Final   Special Requests NONE  Final   Culture (A)  Final    80,000 COLONIES/mL ENTEROCOCCUS FAECALIS 20,000 COLONIES/mL STAPHYLOCOCCUS HAEMOLYTICUS    Report Status 12/21/2016 FINAL  Final   Organism ID, Bacteria ENTEROCOCCUS FAECALIS (A)  Final   Organism ID, Bacteria STAPHYLOCOCCUS HAEMOLYTICUS (A)  Final      Susceptibility   Enterococcus faecalis - MIC*    AMPICILLIN <=2 SENSITIVE Sensitive     LEVOFLOXACIN 1 SENSITIVE Sensitive     NITROFURANTOIN <=16 SENSITIVE Sensitive     VANCOMYCIN 1 SENSITIVE Sensitive     * 80,000 COLONIES/mL ENTEROCOCCUS FAECALIS   Staphylococcus haemolyticus - MIC*    CIPROFLOXACIN <=0.5 SENSITIVE Sensitive     GENTAMICIN <=0.5 SENSITIVE Sensitive     NITROFURANTOIN <=16 SENSITIVE Sensitive     OXACILLIN <=0.25 SENSITIVE Sensitive     TETRACYCLINE >=16 RESISTANT Resistant     VANCOMYCIN <=0.5 SENSITIVE Sensitive     TRIMETH/SULFA <=10 SENSITIVE Sensitive     CLINDAMYCIN <=0.25 SENSITIVE Sensitive     RIFAMPIN <=0.5 SENSITIVE Sensitive     Inducible Clindamycin NEGATIVE Sensitive     * 20,000 COLONIES/mL STAPHYLOCOCCUS HAEMOLYTICUS  Culture, blood (routine x 2)     Status: None   Collection Time: 12/18/16  9:46 PM  Result Value Ref Range Status   Specimen Description BLOOD LEFT ARM  Final   Special Requests IN PEDIATRIC BOTTLE Blood Culture  adequate volume  Final   Culture   Final    NO GROWTH 5 DAYS Performed at [facility], [address] [place], [place] [address]    Report Status 12/23/2016 FINAL  Final  Culture, blood (routine x 2)     Status: None (Preliminary result)   Collection Time: 12/19/16  8:55 PM  Result Value Ref Range Status   Specimen Description BLOOD LEFT ANTECUBITAL  Final   Special Requests   Final    BOTTLES DRAWN AEROBIC AND ANAEROBIC Blood Culture results may not be optimal due to an excessive volume of blood received in culture bottles   Culture   Final    NO GROWTH 3 DAYS Performed at [facility], [place] [address], [place], [place] [address]    Report Status PENDING  Incomplete  Culture, blood (routine x 2)     Status: None (Preliminary result)   Collection Time: 12/19/16  9:01 PM  Result Value Ref Range Status   Specimen Description BLOOD RIGHT ANTECUBITAL  Final   Special Requests   Final    BOTTLES DRAWN AEROBIC ONLY Blood Culture adequate volume   Culture   Final    NO GROWTH 3 DAYS Performed at [facility], [place] [address] [place], [place] [address]    Report Status PENDING  Incomplete    Time coordinating discharge: Approximately 40 minutes  Vance Gather, MD  [facility] 12/23/2016, 3:19 PM Pager [phone]

## 2016-12-22 NOTE — Progress Notes (Addendum)
Pt antibiotics completed for today, orders to discharge.  Instructions given.  Pt refused to sign, being belligerent, yelling and demanding.  Went to pharmacy and picked up and returned home meds, pt refused to sign.  Pt yelled at charge nurse and snatched paper away.  Security called.  Verified by charge nurse.  Pt going home with midline IV access, flushed and SL.  Educated pt.  Pt also going home with foley.  Educated pt on importance of following up with urology.  [facility] called and is backed up.  Awaiting transportation.

## 2016-12-22 NOTE — Progress Notes (Signed)
PHARMACY CONSULT NOTE FOR:  OUTPATIENT  PARENTERAL ANTIBIOTIC THERAPY (OPAT)  Indication: bacteremia Regimen: Ampicillin 2g IV q8h  End date: 01/02/17  IV antibiotic discharge orders are pended. To discharging provider:  please sign these orders via discharge navigator,  Select New Orders & click on the button choice - Manage This Unsigned Work.     Thank you for allowing pharmacy to be a part of this patient's care.  Davin, [place]ta 12/22/2016, 3:20 PM

## 2016-12-22 NOTE — Care Management Note (Signed)
Case Management Note  Patient Details  Name: Stephen Copeland MRN: [id number] Date of Birth: [birth year]  Subjective/Objective:   ARF, Enterococcus UTI and bacteremia               Action/Plan: Discharge Planning: 1130 am cotacted [facility] to follow up on dc with IV abx. NCM contacted attending for update and order for IV abx. NCM spoke to pt and states he lives at home with wife. Pt scheduled to dc home today after evening IV abx. [facility] scheduled to do a soc on 12/23/2016 in am. Pt requesting PTAR.   5:05 pm PT[facility] called for transport. Pt was placed on a waiting list, #[phone].    PCP Dr Silas [place]  Expected Discharge Date:  12/22/16               Expected Discharge Plan:  [place]  In-House Referral:  NA  Discharge planning Services  CM Consult  Post Acute Care Choice:  Home Health Choice offered to:  Patient  DME Arranged:  N/A DME Agency:  NA  HH Arranged:  RN, PT [place] Agency:  [place]  Status of Service:  Completed, signed off  If discussed at [place] of Stay Meetings, dates discussed:    Additional Comments:  Erenest Rasher, RN 12/22/2016, 7:04 PM

## 2016-12-23 LAB — CULTURE, BLOOD (ROUTINE X 2)
CULTURE: NO GROWTH
Special Requests: ADEQUATE

## 2016-12-24 ENCOUNTER — Other Ambulatory Visit: Payer: Self-pay | Admitting: Family Medicine

## 2016-12-24 MED ORDER — AMOXICILLIN 500 MG PO TABS: 500.0000 mg | ORAL_TABLET | Freq: Two times a day (BID) | ORAL | 0 refills | Status: DC

## 2016-12-24 NOTE — Progress Notes (Signed)
I was notified by RN for [place] who reported that the patient has refused to continue IV antibiotics as an outpatient. I called him today to explain the importance of taking antibiotics by IV for a bloodstream infection. He still refuses, doesn't want his wife to do that, and opts for oral antibiotics. Per ID recommendations, I have called in amoxicillin for 2 weeks duration at the pharmacy of his choice ([facility] on [address]). The pharmacist was too busy to take the prescription personally so I left the prescription on voicemail with my cell phone number as callback. Dose is 500mg  BID due to impaired renal function. He will need the midline IV in his right arm pulled at his urology visit which he states is tomorrow. I implored him to keep that appointment, pick up the antibiotics today, and answered all of his questions.   Vance Gather, MD 12/24/2016 1:25 PM

## 2016-12-25 ENCOUNTER — Emergency Department (HOSPITAL_COMMUNITY)
Admission: EM | Admit: 2016-12-25 | Discharge: 2016-12-25 | Discharge status: Absent without leave | Payer: Medicare Other

## 2016-12-25 DIAGNOSIS — R338 Other retention of urine: Secondary | ICD-10-CM | POA: Diagnosis not present

## 2016-12-25 DIAGNOSIS — N401 Enlarged prostate with lower urinary tract symptoms: Secondary | ICD-10-CM | POA: Diagnosis not present

## 2016-12-25 LAB — CULTURE, BLOOD (ROUTINE X 2)
CULTURE: NO GROWTH
Culture: NO GROWTH
Special Requests: ADEQUATE

## 2016-12-26 ENCOUNTER — Emergency Department (HOSPITAL_COMMUNITY)
Admission: EM | Admit: 2016-12-26 | Discharge: 2016-12-26 | Disposition: A | Discharge status: Home / Self Care | Payer: Medicare Other | Attending: Emergency Medicine | Admitting: Emergency Medicine

## 2016-12-26 ENCOUNTER — Encounter (HOSPITAL_COMMUNITY): Payer: Self-pay | Admitting: Emergency Medicine

## 2016-12-26 DIAGNOSIS — Z4589 Encounter for adjustment and management of other implanted devices: Secondary | ICD-10-CM

## 2016-12-26 DIAGNOSIS — T82898A Other specified complication of vascular prosthetic devices, implants and grafts, initial encounter: Secondary | ICD-10-CM | POA: Diagnosis not present

## 2016-12-26 NOTE — ED Triage Notes (Signed)
Pt states he is here to get his PIC line taken out  Pt states he was sent home with it to receive IV antibiotics but they changed them to po and so he does not need it  Pt states he cannot find anyone to take it out

## 2016-12-26 NOTE — ED Provider Notes (Signed)
[facility] DEPT Provider Note   CSN: [id number] Arrival date & time: 12/26/16  9935     History   Chief Complaint Chief Complaint  Patient presents with  . PIC line removal    HPI Stephen Copeland is a 76 y.o. male.  HPI Patient was recently released from the hospital and scheduled for IV antibiotics for enterococcus bacteremia.  He states he instead shows oral amoxicillin as he does not want IV or not manage this at home.  Reading through the chart this was a second option given to the patient by infectious disease.  Patient presents today requesting removal of his PICC line in his right upper extremity.  No pain or shortness of breath.  No signs of infection surrounding the PICC.    Past Medical History:  Diagnosis Date  . Acid reflux   . Folliculitis   . Hiatal hernia   . Hyperlipidemia    a. pt is adamantly against statins.  . Hypertension   . Ischemia    a. Pt states he was diagnosed with "ischemia" in the 1990s but does not know further details, denies hx of heart blockage.  . Nummular dermatitis   . Scoliosis   . Sinus bradycardia   . Stomach ulcer    a. remote ulcer in the 1990s, no hx of bleeding.    Patient Active Problem List   Diagnosis Date Noted  . Right lower lobe pulmonary nodule 12/19/2016  . Noncompliance with treatment plan 12/19/2016  . Bacteremia due to Enterococcus 12/19/2016  . Enterococcus UTI 12/19/2016  . Acute renal failure (ARF) ([place]) 12/18/2016  . Hyperkalemia 12/18/2016  . Hyperbilirubinemia 12/18/2016  . UTI (urinary tract infection) 05/31/2016  . Acute urinary retention 05/31/2016  . Acute kidney injury superimposed on chronic kidney disease ([place]) 05/31/2016  . Generalized weakness 05/31/2016  . Falls 05/31/2016  . Acute systolic CHF (congestive heart failure) ([place])   . Congestive dilated cardiomyopathy ([place])   . Atrial fibrillation with RVR ([place])   . Uncontrolled hypertension   . Pulmonary  hypertension ([place])   . Chronic systolic CHF (congestive heart failure) ([place])   . Atrial fibrillation ([place]) 12/19/2015  . Hypertension 12/19/2015  . New onset a-fib ([place]) 12/19/2015  . Acute CHF ([place]) 12/19/2015  . Hyperglycemia 12/19/2015  . Acid reflux   . Hyperlipidemia   . Sinus bradycardia   . Gastroesophageal reflux disease   . Other hyperlipidemia   . Elevated troponin     Past Surgical History:  Procedure Laterality Date  . INGUINAL HERNIA REPAIR Left        Home Medications    Prior to Admission medications   Medication Sig Start Date End Date Taking? Authorizing Provider  amoxicillin (AMOXIL) 500 MG tablet Take 1 tablet (500 mg total) by mouth 2 (two) times daily. 12/24/16   Patrecia Pour, MD  hydrALAZINE (APRESOLINE) 10 MG tablet Take 1 tablet (10 mg total) by mouth every 8 (eight) hours. 08/14/16 12/18/16  Lyda Jester M, PA-C  metoprolol tartrate (LOPRESSOR) 25 MG tablet Take 0.5 tablets (12.5 mg total) by mouth 2 (two) times daily. 10/17/16 01/15/17  Burnell Blanks, MD  nitroGLYCERIN (NITROSTAT) 0.4 MG SL tablet Place 1 tablet (0.4 mg total) under the tongue every 5 (five) minutes as needed for chest pain. 02/20/16   Burnell Blanks, MD    Family History Family History  Problem Relation Age of Onset  . Heart disease Mother  Further details not reported, died at age 76  . Valvular heart disease Father        H/o MV surgery, diet at age 30    Social History Social History  Substance Use Topics  . Smoking status: Never Smoker  . Smokeless tobacco: Never Used  . Alcohol use No     Allergies   Patient has no known allergies.   Review of Systems Review of Systems  All other systems reviewed and are negative.    Physical Exam Updated Vital Signs BP 129/67 (BP Location: Left Arm)   Pulse 69   Temp (!) 97.5 F (36.4 C) (Oral)   Resp 16   Ht 6' 2.5" (1.892 m)   Wt 64.4 kg (142 lb)   SpO2 98%   BMI 17.99 kg/m    Physical Exam  Constitutional: He is oriented to person, place, and time. He appears well-developed and well-nourished.  HENT:  Head: Normocephalic.  Eyes: EOM are normal.  Neck: Normal range of motion.  Pulmonary/Chest: Effort normal.  Abdominal: He exhibits no distension.  Musculoskeletal: Normal range of motion.  PICC line in right upper extremity without secondary signs of infection.  Neurological: He is alert and oriented to person, place, and time.  Psychiatric: He has a normal mood and affect.  Nursing note and vitals reviewed.    ED Treatments / Results  Labs (all labs ordered are listed, but only abnormal results are displayed) Labs Reviewed - No data to display  EKG  EKG Interpretation None       Radiology No results found.  Procedures .Foreign Body Removal Performed by: Jola Schmidt Authorized by: Jola Schmidt  Consent: Verbal consent obtained. Risks and benefits: risks, benefits and alternatives were discussed Consent given by: patient and spouse Patient identity confirmed: verbally with patient Time out: Immediately prior to procedure a "time out" was called to verify the correct patient, procedure, equipment, support staff and site/side marked as required. Intake: Right upper extremity. Complexity: simple 1 objects recovered. Objects recovered: PICC LINE Post-procedure assessment: foreign body removed Patient tolerance: Patient tolerated the procedure well with no immediate complications   (including critical care time)  Medications Ordered in ED Medications - No data to display   Initial Impression / Assessment and Plan / ED Course  I have reviewed the triage vital signs and the nursing notes.  Pertinent labs & imaging results that were available during my care of the patient were reviewed by me and considered in my medical decision making (see chart for details).     PICC line removed.  Patient on amoxicillin.  Final Clinical  Impressions(s) / ED Diagnoses   Final diagnoses:  Encounter for management of peripherally inserted central catheter (PICC)    New Prescriptions New Prescriptions   No medications on file     Jola Schmidt, MD 12/26/16 [phone]

## 2016-12-31 DIAGNOSIS — H40013 Open angle with borderline findings, low risk, bilateral: Secondary | ICD-10-CM | POA: Diagnosis not present

## 2016-12-31 DIAGNOSIS — Z961 Presence of intraocular lens: Secondary | ICD-10-CM | POA: Diagnosis not present

## 2016-12-31 DIAGNOSIS — H25811 Combined forms of age-related cataract, right eye: Secondary | ICD-10-CM | POA: Diagnosis not present

## 2016-12-31 DIAGNOSIS — H25812 Combined forms of age-related cataract, left eye: Secondary | ICD-10-CM | POA: Diagnosis not present

## 2017-01-01 DIAGNOSIS — N170 Acute kidney failure with tubular necrosis: Secondary | ICD-10-CM | POA: Diagnosis not present

## 2017-01-01 DIAGNOSIS — R338 Other retention of urine: Secondary | ICD-10-CM | POA: Diagnosis not present

## 2017-01-29 DIAGNOSIS — N170 Acute kidney failure with tubular necrosis: Secondary | ICD-10-CM | POA: Diagnosis not present

## 2017-01-29 DIAGNOSIS — R338 Other retention of urine: Secondary | ICD-10-CM | POA: Diagnosis not present

## 2017-02-02 DIAGNOSIS — N401 Enlarged prostate with lower urinary tract symptoms: Secondary | ICD-10-CM | POA: Diagnosis not present

## 2017-02-02 DIAGNOSIS — M6281 Muscle weakness (generalized): Secondary | ICD-10-CM | POA: Diagnosis not present

## 2017-02-02 DIAGNOSIS — I4891 Unspecified atrial fibrillation: Secondary | ICD-10-CM | POA: Diagnosis not present

## 2017-02-02 DIAGNOSIS — I5022 Chronic systolic (congestive) heart failure: Secondary | ICD-10-CM | POA: Diagnosis not present

## 2017-02-02 DIAGNOSIS — R042 Hemoptysis: Secondary | ICD-10-CM | POA: Diagnosis not present

## 2017-02-05 DIAGNOSIS — Z0000 Encounter for general adult medical examination without abnormal findings: Secondary | ICD-10-CM | POA: Diagnosis not present

## 2017-02-05 DIAGNOSIS — I509 Heart failure, unspecified: Secondary | ICD-10-CM | POA: Diagnosis not present

## 2017-02-05 DIAGNOSIS — R5383 Other fatigue: Secondary | ICD-10-CM | POA: Diagnosis not present

## 2017-02-05 DIAGNOSIS — R739 Hyperglycemia, unspecified: Secondary | ICD-10-CM | POA: Diagnosis not present

## 2017-02-05 DIAGNOSIS — E785 Hyperlipidemia, unspecified: Secondary | ICD-10-CM | POA: Diagnosis not present

## 2017-02-05 DIAGNOSIS — I129 Hypertensive chronic kidney disease with stage 1 through stage 4 chronic kidney disease, or unspecified chronic kidney disease: Secondary | ICD-10-CM | POA: Diagnosis not present

## 2017-02-05 DIAGNOSIS — E559 Vitamin D deficiency, unspecified: Secondary | ICD-10-CM | POA: Diagnosis not present

## 2017-03-06 DIAGNOSIS — I10 Essential (primary) hypertension: Secondary | ICD-10-CM | POA: Diagnosis not present

## 2017-03-06 DIAGNOSIS — I5022 Chronic systolic (congestive) heart failure: Secondary | ICD-10-CM | POA: Diagnosis not present

## 2017-03-06 DIAGNOSIS — M6281 Muscle weakness (generalized): Secondary | ICD-10-CM | POA: Diagnosis not present

## 2017-03-06 DIAGNOSIS — I4891 Unspecified atrial fibrillation: Secondary | ICD-10-CM | POA: Diagnosis not present

## 2017-03-07 ENCOUNTER — Encounter (HOSPITAL_COMMUNITY): Payer: Self-pay | Admitting: *Deleted

## 2017-03-07 ENCOUNTER — Other Ambulatory Visit: Payer: Self-pay

## 2017-03-07 DIAGNOSIS — K920 Hematemesis: Secondary | ICD-10-CM | POA: Insufficient documentation

## 2017-03-07 DIAGNOSIS — Z5321 Procedure and treatment not carried out due to patient leaving prior to being seen by health care provider: Secondary | ICD-10-CM | POA: Insufficient documentation

## 2017-03-07 NOTE — ED Triage Notes (Signed)
Pt had labs drawn today and has some abnormal labs, elevated K+, Creat. Pt states he is coughing up thick blood and has been doing it since his last hospitalization. Pt continuously talking making it difficult to determine the need to be here.

## 2017-03-08 ENCOUNTER — Emergency Department (HOSPITAL_COMMUNITY)
Admission: EM | Admit: 2017-03-08 | Discharge: 2017-03-08 | Disposition: A | Discharge status: Home / Self Care | Payer: Medicare Other | Attending: Emergency Medicine | Admitting: Emergency Medicine

## 2017-03-08 DIAGNOSIS — N179 Acute kidney failure, unspecified: Secondary | ICD-10-CM | POA: Diagnosis not present

## 2017-03-08 DIAGNOSIS — K8050 Calculus of bile duct without cholangitis or cholecystitis without obstruction: Secondary | ICD-10-CM | POA: Diagnosis not present

## 2017-03-08 DIAGNOSIS — Z7901 Long term (current) use of anticoagulants: Secondary | ICD-10-CM | POA: Diagnosis not present

## 2017-03-08 DIAGNOSIS — R319 Hematuria, unspecified: Secondary | ICD-10-CM | POA: Diagnosis not present

## 2017-03-08 DIAGNOSIS — I255 Ischemic cardiomyopathy: Secondary | ICD-10-CM | POA: Diagnosis not present

## 2017-03-08 DIAGNOSIS — R5381 Other malaise: Secondary | ICD-10-CM | POA: Diagnosis present

## 2017-03-08 DIAGNOSIS — R0602 Shortness of breath: Secondary | ICD-10-CM | POA: Diagnosis not present

## 2017-03-08 DIAGNOSIS — D631 Anemia in chronic kidney disease: Secondary | ICD-10-CM | POA: Diagnosis present

## 2017-03-08 DIAGNOSIS — I517 Cardiomegaly: Secondary | ICD-10-CM | POA: Diagnosis not present

## 2017-03-08 DIAGNOSIS — R338 Other retention of urine: Secondary | ICD-10-CM | POA: Diagnosis not present

## 2017-03-08 DIAGNOSIS — R339 Retention of urine, unspecified: Secondary | ICD-10-CM | POA: Diagnosis not present

## 2017-03-08 DIAGNOSIS — I480 Paroxysmal atrial fibrillation: Secondary | ICD-10-CM | POA: Diagnosis present

## 2017-03-08 DIAGNOSIS — R531 Weakness: Secondary | ICD-10-CM | POA: Diagnosis not present

## 2017-03-08 DIAGNOSIS — N138 Other obstructive and reflux uropathy: Secondary | ICD-10-CM | POA: Diagnosis present

## 2017-03-08 DIAGNOSIS — R1084 Generalized abdominal pain: Secondary | ICD-10-CM | POA: Diagnosis not present

## 2017-03-08 DIAGNOSIS — I5022 Chronic systolic (congestive) heart failure: Secondary | ICD-10-CM | POA: Diagnosis not present

## 2017-03-08 DIAGNOSIS — N401 Enlarged prostate with lower urinary tract symptoms: Secondary | ICD-10-CM | POA: Diagnosis not present

## 2017-03-08 DIAGNOSIS — I10 Essential (primary) hypertension: Secondary | ICD-10-CM | POA: Diagnosis not present

## 2017-03-08 DIAGNOSIS — N183 Chronic kidney disease, stage 3 (moderate): Secondary | ICD-10-CM | POA: Diagnosis not present

## 2017-03-08 DIAGNOSIS — K8689 Other specified diseases of pancreas: Secondary | ICD-10-CM | POA: Diagnosis not present

## 2017-03-08 DIAGNOSIS — R918 Other nonspecific abnormal finding of lung field: Secondary | ICD-10-CM | POA: Diagnosis not present

## 2017-03-08 DIAGNOSIS — J90 Pleural effusion, not elsewhere classified: Secondary | ICD-10-CM | POA: Diagnosis not present

## 2017-03-08 DIAGNOSIS — N400 Enlarged prostate without lower urinary tract symptoms: Secondary | ICD-10-CM | POA: Diagnosis not present

## 2017-03-08 DIAGNOSIS — R110 Nausea: Secondary | ICD-10-CM | POA: Diagnosis not present

## 2017-03-08 DIAGNOSIS — K5730 Diverticulosis of large intestine without perforation or abscess without bleeding: Secondary | ICD-10-CM | POA: Diagnosis not present

## 2017-03-08 DIAGNOSIS — I130 Hypertensive heart and chronic kidney disease with heart failure and stage 1 through stage 4 chronic kidney disease, or unspecified chronic kidney disease: Secondary | ICD-10-CM | POA: Diagnosis not present

## 2017-03-08 HISTORY — DX: Disorder of kidney and ureter, unspecified: N28.9

## 2017-03-08 HISTORY — DX: Atherosclerotic heart disease of native coronary artery without angina pectoris: I25.10

## 2017-03-08 HISTORY — DX: Heart failure, unspecified: I50.9

## 2017-03-08 NOTE — ED Notes (Signed)
Pt called for recheck v/s, no response from lobby 

## 2017-03-19 DIAGNOSIS — D631 Anemia in chronic kidney disease: Secondary | ICD-10-CM | POA: Diagnosis not present

## 2017-03-19 DIAGNOSIS — I130 Hypertensive heart and chronic kidney disease with heart failure and stage 1 through stage 4 chronic kidney disease, or unspecified chronic kidney disease: Secondary | ICD-10-CM | POA: Diagnosis not present

## 2017-03-19 DIAGNOSIS — I5022 Chronic systolic (congestive) heart failure: Secondary | ICD-10-CM | POA: Diagnosis not present

## 2017-03-19 DIAGNOSIS — I480 Paroxysmal atrial fibrillation: Secondary | ICD-10-CM | POA: Diagnosis not present

## 2017-03-19 DIAGNOSIS — Z466 Encounter for fitting and adjustment of urinary device: Secondary | ICD-10-CM | POA: Diagnosis not present

## 2017-03-19 DIAGNOSIS — Z7901 Long term (current) use of anticoagulants: Secondary | ICD-10-CM | POA: Diagnosis not present

## 2017-03-19 DIAGNOSIS — R338 Other retention of urine: Secondary | ICD-10-CM | POA: Diagnosis not present

## 2017-03-19 DIAGNOSIS — N401 Enlarged prostate with lower urinary tract symptoms: Secondary | ICD-10-CM | POA: Diagnosis not present

## 2017-03-19 DIAGNOSIS — N183 Chronic kidney disease, stage 3 (moderate): Secondary | ICD-10-CM | POA: Diagnosis not present

## 2017-03-25 DIAGNOSIS — D631 Anemia in chronic kidney disease: Secondary | ICD-10-CM | POA: Diagnosis not present

## 2017-03-25 DIAGNOSIS — I5022 Chronic systolic (congestive) heart failure: Secondary | ICD-10-CM | POA: Diagnosis not present

## 2017-03-25 DIAGNOSIS — R338 Other retention of urine: Secondary | ICD-10-CM | POA: Diagnosis not present

## 2017-03-25 DIAGNOSIS — I480 Paroxysmal atrial fibrillation: Secondary | ICD-10-CM | POA: Diagnosis not present

## 2017-03-25 DIAGNOSIS — N183 Chronic kidney disease, stage 3 (moderate): Secondary | ICD-10-CM | POA: Diagnosis not present

## 2017-03-25 DIAGNOSIS — I130 Hypertensive heart and chronic kidney disease with heart failure and stage 1 through stage 4 chronic kidney disease, or unspecified chronic kidney disease: Secondary | ICD-10-CM | POA: Diagnosis not present

## 2017-03-28 ENCOUNTER — Telehealth: Payer: Self-pay | Admitting: Pharmacist

## 2017-03-28 ENCOUNTER — Other Ambulatory Visit: Payer: Self-pay | Admitting: Cardiovascular Disease

## 2017-03-28 NOTE — Telephone Encounter (Signed)
Stephen Copeland, I agree. I think we should change him to coumadin. I will include Fraser Din on this as well. Thanks, chris

## 2017-03-28 NOTE — Telephone Encounter (Signed)
LMOM to schedule in office to discuss transition to warfarin from Eliquis. Will need to find out how much Eliquis he has remaining in home.

## 2017-03-28 NOTE — Telephone Encounter (Signed)
Burnell Blanks, MD  to [name], RN . Me     03/28/17 12:38 PM  Note    Stephen Copeland, I agree. I think we should change him to coumadin. I will include Fraser Din on this as well. Thanks, chris       Me  to Burnell Blanks, MD       03/28/17 12:21 PM  Dr. [name],  [name] was admitted to [facility] on 03/08/17 for abnormal labs. Per the records his Scr remained persistently elevated and was 5.9 on discharge. He takes Eliquis for anticoagulation for his Afib. It does not look like they started dialysis, but based on his kidney function I would recommend we change to warfarin as Eliquis is not studied in patients with Scr >2.5. Please advise. Thank you!  Stephen Copeland

## 2017-03-29 MED ORDER — WARFARIN SODIUM 5 MG PO TABS: ORAL_TABLET | ORAL | 0 refills | Status: DC

## 2017-03-29 NOTE — Telephone Encounter (Signed)
Spoke with Manuela Schwartz who states that she is his case Freight forwarder. She will work with [place] to coordinate this transition. Their next home visit is on 04/01/17 and again on 03/08/17. She acknowledges that patient has a difficult time understanding   Gave verbal orders to start warfarin 5mg  on 04/04/17 and 3 day overlap with Eliquis. He will stop Eliquis on 03/06/17 and Orders given for PT/INR to be drawn and called to our office on 03/08/17. Manuela Schwartz will fill his pill box on 1/28 when she is there for her home visit.   He will likely need extensive counseling on warfarin therapy and Manuela Schwartz is aware and willing to assist with this.

## 2017-03-29 NOTE — Telephone Encounter (Signed)
Spoke with patient who reports that he is taking generic Eliquis - he is unsure where he obtained the generic as this is not commercially available currently, but he did spell apixaban out to me and is insistent that it is in fact the generic. He states that he has about 60 tablets of the Eliquis left at home.   We discussed his kidney function in relation to Eliquis, but he is unwilling to come to the office to transition to warfarin. He states that I will need to call and speak to his [place], Manuela Schwartz.   Have called [facility] to try to coordinate the transition.   Spoke with Loma Sousa with [facility] who gave me Susan's number [phone].   LMOM for Manuela Schwartz to call back to try to coordinate transition.

## 2017-03-29 NOTE — Telephone Encounter (Signed)
LMOM to follow up 

## 2017-04-01 DIAGNOSIS — I130 Hypertensive heart and chronic kidney disease with heart failure and stage 1 through stage 4 chronic kidney disease, or unspecified chronic kidney disease: Secondary | ICD-10-CM | POA: Diagnosis not present

## 2017-04-01 DIAGNOSIS — D631 Anemia in chronic kidney disease: Secondary | ICD-10-CM | POA: Diagnosis not present

## 2017-04-01 DIAGNOSIS — R338 Other retention of urine: Secondary | ICD-10-CM | POA: Diagnosis not present

## 2017-04-01 DIAGNOSIS — I5022 Chronic systolic (congestive) heart failure: Secondary | ICD-10-CM | POA: Diagnosis not present

## 2017-04-01 DIAGNOSIS — N183 Chronic kidney disease, stage 3 (moderate): Secondary | ICD-10-CM | POA: Diagnosis not present

## 2017-04-01 DIAGNOSIS — I480 Paroxysmal atrial fibrillation: Secondary | ICD-10-CM | POA: Diagnosis not present

## 2017-04-03 DIAGNOSIS — D631 Anemia in chronic kidney disease: Secondary | ICD-10-CM | POA: Diagnosis not present

## 2017-04-03 DIAGNOSIS — I5022 Chronic systolic (congestive) heart failure: Secondary | ICD-10-CM | POA: Diagnosis not present

## 2017-04-03 DIAGNOSIS — N183 Chronic kidney disease, stage 3 (moderate): Secondary | ICD-10-CM | POA: Diagnosis not present

## 2017-04-03 DIAGNOSIS — I130 Hypertensive heart and chronic kidney disease with heart failure and stage 1 through stage 4 chronic kidney disease, or unspecified chronic kidney disease: Secondary | ICD-10-CM | POA: Diagnosis not present

## 2017-04-03 DIAGNOSIS — R338 Other retention of urine: Secondary | ICD-10-CM | POA: Diagnosis not present

## 2017-04-03 DIAGNOSIS — I480 Paroxysmal atrial fibrillation: Secondary | ICD-10-CM | POA: Diagnosis not present

## 2017-04-04 DIAGNOSIS — I130 Hypertensive heart and chronic kidney disease with heart failure and stage 1 through stage 4 chronic kidney disease, or unspecified chronic kidney disease: Secondary | ICD-10-CM | POA: Diagnosis not present

## 2017-04-04 DIAGNOSIS — R338 Other retention of urine: Secondary | ICD-10-CM | POA: Diagnosis not present

## 2017-04-04 DIAGNOSIS — D631 Anemia in chronic kidney disease: Secondary | ICD-10-CM | POA: Diagnosis not present

## 2017-04-04 DIAGNOSIS — I5022 Chronic systolic (congestive) heart failure: Secondary | ICD-10-CM | POA: Diagnosis not present

## 2017-04-04 DIAGNOSIS — N183 Chronic kidney disease, stage 3 (moderate): Secondary | ICD-10-CM | POA: Diagnosis not present

## 2017-04-04 DIAGNOSIS — I480 Paroxysmal atrial fibrillation: Secondary | ICD-10-CM | POA: Diagnosis not present

## 2017-04-08 DIAGNOSIS — D631 Anemia in chronic kidney disease: Secondary | ICD-10-CM | POA: Diagnosis not present

## 2017-04-08 DIAGNOSIS — N183 Chronic kidney disease, stage 3 (moderate): Secondary | ICD-10-CM | POA: Diagnosis not present

## 2017-04-08 DIAGNOSIS — I255 Ischemic cardiomyopathy: Secondary | ICD-10-CM | POA: Diagnosis not present

## 2017-04-08 DIAGNOSIS — N185 Chronic kidney disease, stage 5: Secondary | ICD-10-CM | POA: Diagnosis not present

## 2017-04-08 DIAGNOSIS — I10 Essential (primary) hypertension: Secondary | ICD-10-CM | POA: Diagnosis not present

## 2017-04-08 DIAGNOSIS — I130 Hypertensive heart and chronic kidney disease with heart failure and stage 1 through stage 4 chronic kidney disease, or unspecified chronic kidney disease: Secondary | ICD-10-CM | POA: Diagnosis not present

## 2017-04-08 DIAGNOSIS — N139 Obstructive and reflux uropathy, unspecified: Secondary | ICD-10-CM | POA: Diagnosis not present

## 2017-04-08 DIAGNOSIS — R338 Other retention of urine: Secondary | ICD-10-CM | POA: Diagnosis not present

## 2017-04-08 DIAGNOSIS — I480 Paroxysmal atrial fibrillation: Secondary | ICD-10-CM | POA: Diagnosis not present

## 2017-04-08 DIAGNOSIS — I5022 Chronic systolic (congestive) heart failure: Secondary | ICD-10-CM | POA: Diagnosis not present

## 2017-04-08 LAB — PROTIME-INR: INR: 1.6 — AB (ref 0.9–1.1)

## 2017-04-09 ENCOUNTER — Ambulatory Visit (INDEPENDENT_AMBULATORY_CARE_PROVIDER_SITE_OTHER): Payer: Medicare Other | Admitting: Cardiovascular Disease

## 2017-04-09 DIAGNOSIS — D631 Anemia in chronic kidney disease: Secondary | ICD-10-CM | POA: Diagnosis not present

## 2017-04-09 DIAGNOSIS — R338 Other retention of urine: Secondary | ICD-10-CM | POA: Diagnosis not present

## 2017-04-09 DIAGNOSIS — I130 Hypertensive heart and chronic kidney disease with heart failure and stage 1 through stage 4 chronic kidney disease, or unspecified chronic kidney disease: Secondary | ICD-10-CM | POA: Diagnosis not present

## 2017-04-09 DIAGNOSIS — I480 Paroxysmal atrial fibrillation: Secondary | ICD-10-CM | POA: Diagnosis not present

## 2017-04-09 DIAGNOSIS — Z5181 Encounter for therapeutic drug level monitoring: Secondary | ICD-10-CM | POA: Diagnosis not present

## 2017-04-09 DIAGNOSIS — I482 Chronic atrial fibrillation, unspecified: Secondary | ICD-10-CM

## 2017-04-09 DIAGNOSIS — N183 Chronic kidney disease, stage 3 (moderate): Secondary | ICD-10-CM | POA: Diagnosis not present

## 2017-04-09 DIAGNOSIS — I5022 Chronic systolic (congestive) heart failure: Secondary | ICD-10-CM | POA: Diagnosis not present

## 2017-04-17 ENCOUNTER — Ambulatory Visit (INDEPENDENT_AMBULATORY_CARE_PROVIDER_SITE_OTHER): Payer: Medicare Other | Admitting: Cardiovascular Disease

## 2017-04-17 ENCOUNTER — Telehealth: Payer: Self-pay | Admitting: *Deleted

## 2017-04-17 DIAGNOSIS — I130 Hypertensive heart and chronic kidney disease with heart failure and stage 1 through stage 4 chronic kidney disease, or unspecified chronic kidney disease: Secondary | ICD-10-CM | POA: Diagnosis not present

## 2017-04-17 DIAGNOSIS — I5022 Chronic systolic (congestive) heart failure: Secondary | ICD-10-CM | POA: Diagnosis not present

## 2017-04-17 DIAGNOSIS — N183 Chronic kidney disease, stage 3 (moderate): Secondary | ICD-10-CM | POA: Diagnosis not present

## 2017-04-17 DIAGNOSIS — D631 Anemia in chronic kidney disease: Secondary | ICD-10-CM | POA: Diagnosis not present

## 2017-04-17 DIAGNOSIS — Z5181 Encounter for therapeutic drug level monitoring: Secondary | ICD-10-CM

## 2017-04-17 DIAGNOSIS — I482 Chronic atrial fibrillation, unspecified: Secondary | ICD-10-CM

## 2017-04-17 DIAGNOSIS — R338 Other retention of urine: Secondary | ICD-10-CM | POA: Diagnosis not present

## 2017-04-17 DIAGNOSIS — I480 Paroxysmal atrial fibrillation: Secondary | ICD-10-CM | POA: Diagnosis not present

## 2017-04-17 LAB — POCT INR: INR: 3.6

## 2017-04-17 NOTE — Telephone Encounter (Signed)
Spoke with Stephen Copeland and he had many complaints and concerns about having different people coming into his home Instructed if has a problem to call the [facility] and express those to them This nurse emphasized that we only dose his coumadin and stressed to him that he is to hold coumadin today Feb 13th as his INR was 3.6 and and then start taking coumadin 5mg  daily and this repeated times 3.Stephen Copeland states he has an appt in [place] on Feb 20th and will not be home so instructed that will call [facility] and have them recheck his INR on the 19th This nurse called and spoke with Elicia Lamp the nurse who saw him today and changed the date for recheck INR from February 20th to the 19th

## 2017-04-23 ENCOUNTER — Ambulatory Visit (INDEPENDENT_AMBULATORY_CARE_PROVIDER_SITE_OTHER): Payer: Medicare Other | Admitting: Interventional Cardiology

## 2017-04-23 DIAGNOSIS — I482 Chronic atrial fibrillation, unspecified: Secondary | ICD-10-CM

## 2017-04-23 DIAGNOSIS — R338 Other retention of urine: Secondary | ICD-10-CM | POA: Diagnosis not present

## 2017-04-23 DIAGNOSIS — I4891 Unspecified atrial fibrillation: Secondary | ICD-10-CM

## 2017-04-23 DIAGNOSIS — Z5181 Encounter for therapeutic drug level monitoring: Secondary | ICD-10-CM

## 2017-04-23 DIAGNOSIS — I130 Hypertensive heart and chronic kidney disease with heart failure and stage 1 through stage 4 chronic kidney disease, or unspecified chronic kidney disease: Secondary | ICD-10-CM | POA: Diagnosis not present

## 2017-04-23 DIAGNOSIS — I5022 Chronic systolic (congestive) heart failure: Secondary | ICD-10-CM | POA: Diagnosis not present

## 2017-04-23 DIAGNOSIS — N183 Chronic kidney disease, stage 3 (moderate): Secondary | ICD-10-CM | POA: Diagnosis not present

## 2017-04-23 DIAGNOSIS — I480 Paroxysmal atrial fibrillation: Secondary | ICD-10-CM | POA: Diagnosis not present

## 2017-04-23 DIAGNOSIS — D631 Anemia in chronic kidney disease: Secondary | ICD-10-CM | POA: Diagnosis not present

## 2017-04-23 LAB — POCT INR: INR: 2.1

## 2017-04-23 NOTE — Patient Instructions (Signed)
Description   Spoke with Elicia Lamp with [facility] while  in home with patient and advised to have patient continue to take 1 tablet (5mg  ) daily .  Orders given to [facility] with [facility] to recheck in 2 weeks.

## 2017-04-24 DIAGNOSIS — I10 Essential (primary) hypertension: Secondary | ICD-10-CM | POA: Diagnosis not present

## 2017-04-24 DIAGNOSIS — N401 Enlarged prostate with lower urinary tract symptoms: Secondary | ICD-10-CM | POA: Diagnosis not present

## 2017-04-24 DIAGNOSIS — I255 Ischemic cardiomyopathy: Secondary | ICD-10-CM | POA: Diagnosis not present

## 2017-04-24 DIAGNOSIS — N138 Other obstructive and reflux uropathy: Secondary | ICD-10-CM | POA: Diagnosis not present

## 2017-04-25 DIAGNOSIS — I509 Heart failure, unspecified: Secondary | ICD-10-CM | POA: Diagnosis not present

## 2017-04-25 DIAGNOSIS — T8384XA Pain from genitourinary prosthetic devices, implants and grafts, initial encounter: Secondary | ICD-10-CM | POA: Diagnosis not present

## 2017-04-25 DIAGNOSIS — Z7901 Long term (current) use of anticoagulants: Secondary | ICD-10-CM | POA: Diagnosis not present

## 2017-04-25 DIAGNOSIS — K259 Gastric ulcer, unspecified as acute or chronic, without hemorrhage or perforation: Secondary | ICD-10-CM | POA: Diagnosis not present

## 2017-04-25 DIAGNOSIS — I10 Essential (primary) hypertension: Secondary | ICD-10-CM | POA: Diagnosis not present

## 2017-04-25 DIAGNOSIS — I110 Hypertensive heart disease with heart failure: Secondary | ICD-10-CM | POA: Diagnosis not present

## 2017-04-25 DIAGNOSIS — Z79899 Other long term (current) drug therapy: Secondary | ICD-10-CM | POA: Diagnosis not present

## 2017-04-25 DIAGNOSIS — T83098A Other mechanical complication of other indwelling urethral catheter, initial encounter: Secondary | ICD-10-CM | POA: Diagnosis not present

## 2017-04-26 DIAGNOSIS — Z7901 Long term (current) use of anticoagulants: Secondary | ICD-10-CM | POA: Diagnosis not present

## 2017-04-26 DIAGNOSIS — M6281 Muscle weakness (generalized): Secondary | ICD-10-CM | POA: Diagnosis not present

## 2017-04-26 DIAGNOSIS — I5022 Chronic systolic (congestive) heart failure: Secondary | ICD-10-CM | POA: Diagnosis not present

## 2017-04-26 DIAGNOSIS — I4891 Unspecified atrial fibrillation: Secondary | ICD-10-CM | POA: Diagnosis not present

## 2017-05-07 ENCOUNTER — Ambulatory Visit (INDEPENDENT_AMBULATORY_CARE_PROVIDER_SITE_OTHER): Payer: Medicare Other | Admitting: Interventional Cardiology

## 2017-05-07 DIAGNOSIS — I4891 Unspecified atrial fibrillation: Secondary | ICD-10-CM | POA: Diagnosis not present

## 2017-05-07 DIAGNOSIS — I482 Chronic atrial fibrillation, unspecified: Secondary | ICD-10-CM

## 2017-05-07 DIAGNOSIS — I130 Hypertensive heart and chronic kidney disease with heart failure and stage 1 through stage 4 chronic kidney disease, or unspecified chronic kidney disease: Secondary | ICD-10-CM | POA: Diagnosis not present

## 2017-05-07 DIAGNOSIS — I5022 Chronic systolic (congestive) heart failure: Secondary | ICD-10-CM | POA: Diagnosis not present

## 2017-05-07 DIAGNOSIS — R338 Other retention of urine: Secondary | ICD-10-CM | POA: Diagnosis not present

## 2017-05-07 DIAGNOSIS — Z5181 Encounter for therapeutic drug level monitoring: Secondary | ICD-10-CM

## 2017-05-07 DIAGNOSIS — D631 Anemia in chronic kidney disease: Secondary | ICD-10-CM | POA: Diagnosis not present

## 2017-05-07 DIAGNOSIS — I480 Paroxysmal atrial fibrillation: Secondary | ICD-10-CM | POA: Diagnosis not present

## 2017-05-07 DIAGNOSIS — N183 Chronic kidney disease, stage 3 (moderate): Secondary | ICD-10-CM | POA: Diagnosis not present

## 2017-05-07 LAB — POCT INR: INR: 2.4

## 2017-05-07 NOTE — Patient Instructions (Signed)
Description   Spoke with Elicia Lamp with [facility] while  in home with patient and advised to have patient continue to take 1 tablet (5mg  ) daily .  Orders given to [place] with [facility] and she states they are to discharge pt this week or next week so made an appt for him to be seen in coumadin clinic in 3 weeks

## 2017-05-08 DIAGNOSIS — N139 Obstructive and reflux uropathy, unspecified: Secondary | ICD-10-CM | POA: Diagnosis not present

## 2017-05-08 DIAGNOSIS — I10 Essential (primary) hypertension: Secondary | ICD-10-CM | POA: Diagnosis not present

## 2017-05-08 DIAGNOSIS — I480 Paroxysmal atrial fibrillation: Secondary | ICD-10-CM | POA: Diagnosis not present

## 2017-05-08 DIAGNOSIS — N184 Chronic kidney disease, stage 4 (severe): Secondary | ICD-10-CM | POA: Diagnosis not present

## 2017-05-08 DIAGNOSIS — I5022 Chronic systolic (congestive) heart failure: Secondary | ICD-10-CM | POA: Diagnosis not present

## 2017-05-14 DIAGNOSIS — N401 Enlarged prostate with lower urinary tract symptoms: Secondary | ICD-10-CM | POA: Diagnosis not present

## 2017-05-14 DIAGNOSIS — N138 Other obstructive and reflux uropathy: Secondary | ICD-10-CM | POA: Diagnosis not present

## 2017-05-16 DIAGNOSIS — I5022 Chronic systolic (congestive) heart failure: Secondary | ICD-10-CM | POA: Diagnosis not present

## 2017-05-16 DIAGNOSIS — D631 Anemia in chronic kidney disease: Secondary | ICD-10-CM | POA: Diagnosis not present

## 2017-05-16 DIAGNOSIS — R338 Other retention of urine: Secondary | ICD-10-CM | POA: Diagnosis not present

## 2017-05-16 DIAGNOSIS — I480 Paroxysmal atrial fibrillation: Secondary | ICD-10-CM | POA: Diagnosis not present

## 2017-05-16 DIAGNOSIS — N183 Chronic kidney disease, stage 3 (moderate): Secondary | ICD-10-CM | POA: Diagnosis not present

## 2017-05-16 DIAGNOSIS — I130 Hypertensive heart and chronic kidney disease with heart failure and stage 1 through stage 4 chronic kidney disease, or unspecified chronic kidney disease: Secondary | ICD-10-CM | POA: Diagnosis not present

## 2017-05-18 DIAGNOSIS — N183 Chronic kidney disease, stage 3 (moderate): Secondary | ICD-10-CM | POA: Diagnosis not present

## 2017-05-18 DIAGNOSIS — I130 Hypertensive heart and chronic kidney disease with heart failure and stage 1 through stage 4 chronic kidney disease, or unspecified chronic kidney disease: Secondary | ICD-10-CM | POA: Diagnosis not present

## 2017-05-18 DIAGNOSIS — D631 Anemia in chronic kidney disease: Secondary | ICD-10-CM | POA: Diagnosis not present

## 2017-05-18 DIAGNOSIS — R338 Other retention of urine: Secondary | ICD-10-CM | POA: Diagnosis not present

## 2017-05-18 DIAGNOSIS — N401 Enlarged prostate with lower urinary tract symptoms: Secondary | ICD-10-CM | POA: Diagnosis not present

## 2017-05-18 DIAGNOSIS — I5022 Chronic systolic (congestive) heart failure: Secondary | ICD-10-CM | POA: Diagnosis not present

## 2017-05-18 DIAGNOSIS — I480 Paroxysmal atrial fibrillation: Secondary | ICD-10-CM | POA: Diagnosis not present

## 2017-05-18 DIAGNOSIS — Z466 Encounter for fitting and adjustment of urinary device: Secondary | ICD-10-CM | POA: Diagnosis not present

## 2017-05-18 DIAGNOSIS — Z7901 Long term (current) use of anticoagulants: Secondary | ICD-10-CM | POA: Diagnosis not present

## 2017-05-20 ENCOUNTER — Other Ambulatory Visit: Payer: Self-pay | Admitting: Cardiovascular Disease

## 2017-05-20 MED ORDER — HYDRALAZINE HCL 10 MG PO TABS: 10.0000 mg | ORAL_TABLET | Freq: Three times a day (TID) | ORAL | 1 refills | Status: DC

## 2017-05-22 ENCOUNTER — Telehealth: Payer: Self-pay | Admitting: *Deleted

## 2017-05-22 NOTE — Telephone Encounter (Signed)
Pt walked into office this afternoon reporting he is to have surgery (prostate scraped) and wants to make sure Dr [name] is aware.  Pt seemed agitated in discussing the procedure, is unsure when or where it is and that he was told to stop his coumadin 5 days prior and start Finasteride 2 weeks after.  He reports Dr. Ihor Dow at [facility] [phone] will be doing the surgery.  Advised I will attempt to find out more information for him and also make Dr [name] aware. Called and spoke with Maudie Mercury at Dr Shanda Bumps office who reports pt is scheduled for a transurethral resection of the prostate scheduled for Monday 06/24/2017.  She will call the patient with further instruction/information.  Requested if Dr Tommi Rumps needs surgical clearance or if the patient needs to hold coumadin/possible bridging to please fax a written request to the office.  Fax # given to our office.

## 2017-05-23 DIAGNOSIS — N401 Enlarged prostate with lower urinary tract symptoms: Secondary | ICD-10-CM | POA: Diagnosis not present

## 2017-05-23 DIAGNOSIS — N183 Chronic kidney disease, stage 3 (moderate): Secondary | ICD-10-CM | POA: Diagnosis not present

## 2017-05-23 DIAGNOSIS — I130 Hypertensive heart and chronic kidney disease with heart failure and stage 1 through stage 4 chronic kidney disease, or unspecified chronic kidney disease: Secondary | ICD-10-CM | POA: Diagnosis not present

## 2017-05-23 DIAGNOSIS — Z466 Encounter for fitting and adjustment of urinary device: Secondary | ICD-10-CM | POA: Diagnosis not present

## 2017-05-23 DIAGNOSIS — I5022 Chronic systolic (congestive) heart failure: Secondary | ICD-10-CM | POA: Diagnosis not present

## 2017-05-23 DIAGNOSIS — R338 Other retention of urine: Secondary | ICD-10-CM | POA: Diagnosis not present

## 2017-05-24 ENCOUNTER — Telehealth: Payer: Self-pay

## 2017-05-24 ENCOUNTER — Telehealth: Payer: Self-pay | Admitting: Pharmacist

## 2017-05-24 NOTE — Telephone Encounter (Signed)
Pt called clinic to cancel his upcoming Coumadin appt next Tuesday 3/26. He states he has a new Film/video editor who will be seeing him on Tuesday and checking his INR. Advised pt that he would need a medical reason for them to continue his home visits and pt states that he decides when he gets discharged from home health since he's paying for it. Advised pt that if we do not get a call from an RN on 3/26 we will call him to reschedule Coumadin check in clinic.

## 2017-05-24 NOTE — Telephone Encounter (Signed)
   [facility] Pre-operative Risk Assessment    Request for surgical clearance:  1. What type of surgery is being performed? TURP   2. When is this surgery scheduled? TBD   3. What type of clearance is required (medical clearance vs. Pharmacy clearance to hold med vs. Both)? Both  4. Are there any medications that need to be held prior to surgery and how long? Coumadin several days preoperatively and 2-3 weeks postoperatively   5. Practice name and name of physician performing surgery? [facility] Dr. Ihor Dow   6. What is your office phone and fax number? Phone: [phone], Fax: [phone]   7. Anesthesia type (None, local, MAC, general) ? None listed   Stephen Copeland 05/24/2017, 4:46 PM  _________________________________________________________________   (provider comments below)

## 2017-05-26 NOTE — Telephone Encounter (Signed)
Thanks!    Chris.

## 2017-05-27 NOTE — Telephone Encounter (Signed)
   Primary Cardiologist:Christopher [name], MD  Chart reviewed as part of pre-operative protocol coverage. Because of Stephen Copeland past medical history and time since last visit, he/she will require a follow-up visit in order to better assess preoperative cardiovascular risk.  Pre-op covering staff: - Please schedule appointment and call patient to inform them. - Please contact requesting surgeon's office via preferred method (i.e, phone, fax) to inform them of need for appointment prior to surgery.   Will also route to pharmacy pool to comment on holding anticoagulation prior to and 2-3 weeks after procedure.   [place], PA  05/27/2017, 2:03 PM

## 2017-05-27 NOTE — Telephone Encounter (Signed)
Pt schedule with Bhagat 05-30-17 at 8:30  am

## 2017-05-27 NOTE — Telephone Encounter (Signed)
Patient with diagnosis of Afib on warfarin for anticoagulation.    Procedure: TURP Date of procedure: TBD  CHADS2-VASc score of  5 (CHF, HTN, AGE, DM2, stroke/tia x 2, CAD, AGE, male)  Per office protocol, patient can hold warfarin for 5 days prior to procedure.   Patient will not need bridging with Lovenox (enoxaparin) around procedure.

## 2017-05-28 ENCOUNTER — Other Ambulatory Visit: Payer: Self-pay | Admitting: Cardiovascular Disease

## 2017-05-28 ENCOUNTER — Ambulatory Visit (INDEPENDENT_AMBULATORY_CARE_PROVIDER_SITE_OTHER): Payer: Medicare Other | Admitting: Interventional Cardiology

## 2017-05-28 ENCOUNTER — Telehealth: Payer: Self-pay | Admitting: Cardiovascular Disease

## 2017-05-28 ENCOUNTER — Telehealth: Payer: Self-pay | Admitting: *Deleted

## 2017-05-28 DIAGNOSIS — N401 Enlarged prostate with lower urinary tract symptoms: Secondary | ICD-10-CM | POA: Diagnosis not present

## 2017-05-28 DIAGNOSIS — I482 Chronic atrial fibrillation, unspecified: Secondary | ICD-10-CM

## 2017-05-28 DIAGNOSIS — I5022 Chronic systolic (congestive) heart failure: Secondary | ICD-10-CM | POA: Diagnosis not present

## 2017-05-28 DIAGNOSIS — I130 Hypertensive heart and chronic kidney disease with heart failure and stage 1 through stage 4 chronic kidney disease, or unspecified chronic kidney disease: Secondary | ICD-10-CM | POA: Diagnosis not present

## 2017-05-28 DIAGNOSIS — R338 Other retention of urine: Secondary | ICD-10-CM | POA: Diagnosis not present

## 2017-05-28 DIAGNOSIS — Z5181 Encounter for therapeutic drug level monitoring: Secondary | ICD-10-CM

## 2017-05-28 DIAGNOSIS — N183 Chronic kidney disease, stage 3 (moderate): Secondary | ICD-10-CM | POA: Diagnosis not present

## 2017-05-28 DIAGNOSIS — Z466 Encounter for fitting and adjustment of urinary device: Secondary | ICD-10-CM | POA: Diagnosis not present

## 2017-05-28 LAB — POCT INR: INR: 1.5

## 2017-05-28 NOTE — Telephone Encounter (Signed)
LM on VM.

## 2017-05-28 NOTE — Telephone Encounter (Signed)
New Message     Pt c/o medication issue:  1. Name of Medication:  hydrALAZINE (APRESOLINE) 25 MG tablet Take by mouth.         2. How are you currently taking this medication (dosage and times per day)? ?  3. Are you having a reaction (difficulty breathing--STAT)?   4. What is your medication issue?  [facility] sent him 10 mg hydralazine , was there a change in his medication ?

## 2017-05-28 NOTE — Telephone Encounter (Signed)
[facility] is requesting a refill on hydralazine 25 mg tablet. Pt was taking hydralazine 10 mg tablet TID, it was reorder as Hydralazine 25 mg tablet taken daily. Please clarify the strength and how often pt is taking this medication. Please address

## 2017-05-28 NOTE — Telephone Encounter (Signed)
Spoke with patient today regarding his hydralazine refill. He states he has been taking 25mg  tid but his refill is for 10mg  tid. He wanted to clarify. I told him I wasn't able to see in his past OV notes that Dr [name] prescribed the 25mg  dose and perhaps another provider increased his dosage. If so, he can contact them for the correct dose. He stated he has been seen by many different doctors lately and it is possible it was prescribed by someone else. He stated he is coming in on Thursday March 28 for a surgical clearance appt which I told him to take that opportunity to clarify his prescription. I also told him I would forward this message to Dr Camillia Herter nurse for any further recommendation.   He verbalized understanding and had no additional questions.

## 2017-05-28 NOTE — Telephone Encounter (Signed)
Pt c/o medication issue:  1. Name of Medication: 1) HYDRALIZINE  10mg    2) Finasterid 5mg  takes 2wk after surgy  2. How are you currently taking this medication (dosage and times per day)? 1 every 8hr  3. Are you having a reaction (difficulty breathing--STAT)? no  4. What is your medication issue? Per pt has been taking 25mg  has a question about the 10mg  dosage?? Pt needs a call back please.

## 2017-05-28 NOTE — Telephone Encounter (Signed)
Pt called stating he had been taking Hydralazine 25mg  and he got refill from mail order for Hydralazine  10 mg and he does not know what dose he is to take. Pt instructed to call tomorrow and ask for Dr Camillia Herter nurse to be sure of dose of Hydralazine he is to take and he states will do so

## 2017-05-29 NOTE — Progress Notes (Signed)
Cardiology Office Note    Date:  05/30/2017   ID:  Stephen Copeland, DOB 1940-08-20, MRN [id number]  PCP:  Patient, No Pcp Per  Cardiologist:  Dr. [name]  Chief Complaint: Pre-op clearance  History of Present Illness:   Stephen Copeland is a 77 y.o. male with history of HTN, HLD, sinus bradycardia, hiatal hernia, GERD, paroxysmal atrial fibrillation with LBBB, CKD stage IV (managed by Dr. Welton Flakes @ [facility] )  and chronic systolic CHF  presents for pre-op clearance for TURP 06/24/17.  He was admitted to [facility] in October 2017 with rapid atrial fibrillation. He was started on amiodarone and Eliquis and converted to NSR. Echo with LVEF=40%. Outpatient stress test November 2017 was a high risk study with inferior and apical thinning with a perfusion defect in the mid anterior wall and apex. We had planned a cardiac cath but he c/o blackish green stools, cough and had no chest pain so cath was delayed.  He began having blood in his urine in February 2018 and was seen in Urology. Eliquis held for several days. He was started on Flomax. His hematuria resolved.   In regards to his schronic Systolic HF/ Cardiomyopathy,  It is unclear if his cardiomyopathy is due to rapid atrial fibrillation versus underlying coronary artery disease. He had an abnormal stress test in the fall of 2017 however this was not pursued as he was having some issues with bleeding. He has not had any chest discomfort thus patient continues to decline cardiac catheterization.   He was admitted to [facility] March 2018 with UTI/sepsis/Pneumonia and had elevated LFTs. Amiodarone and Lipitor were stopped. His metoprolol was stopped for unclear reasons.   He was doing well on cardiac stand point when last seen by Dr. [name] 12/2016. Restarted BB.   Last echocardiogram 12/2016 showed persisted low LVEF of 30-35% (40% on 12/2015; 35-40% on 05/2016) with grade 1 DD. No WM abnormality. Mild MR and mild dilated LA.   Today he is here for cardiac  clearance.  He denies any chest pain/tightness.  Did not require any sublingual nitroglycerin.  He denies any exertional shortness of breath.  He has intermittent lower extremity edema, orthopnea and PND.  Compliant with low-sodium diet.  Patient has a stage IV chronic kidney disease this is managed by   Dr. Welton Flakes at [facility].   Past Medical History:  Diagnosis Date  . Acid reflux   . CHF (congestive heart failure) ([place])   . Coronary artery disease   . Folliculitis   . Hiatal hernia   . Hyperlipidemia    a. pt is adamantly against statins.  . Hypertension   . Ischemia    a. Pt states he was diagnosed with "ischemia" in the 1990s but does not know further details, denies hx of heart blockage.  . Nummular dermatitis   . Renal disorder   . Scoliosis   . Sinus bradycardia   . Stomach ulcer    a. remote ulcer in the 1990s, no hx of bleeding.    Past Surgical History:  Procedure Laterality Date  . INGUINAL HERNIA REPAIR Left     Current Medications: Prior to Admission medications   Medication Sig Start Date End Date Taking? Authorizing Provider  amoxicillin (AMOXIL) 500 MG tablet Take 1 tablet (500 mg total) by mouth 2 (two) times daily. 12/24/16  Yes Patrecia Pour, MD  hydrALAZINE (APRESOLINE) 25 MG tablet Take 25 mg by mouth every 8 (eight) hours.  03/13/17  Yes [provider]  metoprolol tartrate (LOPRESSOR) 25 MG tablet Take 25 mg by mouth 2 (two) times daily.  03/04/17  Yes [provider]  mirtazapine (REMERON) 30 MG tablet mirtazapine 30 mg tablet   Yes [provider]  nitroGLYCERIN (NITROSTAT) 0.4 MG SL tablet Place under the tongue. 02/20/16  Yes [provider]  tamsulosin (FLOMAX) 0.4 MG CAPS capsule Take 0.4 mg by mouth daily after supper.  03/14/17  Yes [provider]  warfarin (COUMADIN) 5 MG tablet TAKE 1 TABLET DAILY OR AS DIRECTED BY COUMADIN CLINIC (ELIQUIS WILL BE STOPPED) 05/20/17  Yes Burnell Blanks, MD    Allergies:   Patient has no known allergies.   Social History   Socioeconomic History  . Marital status: Married    Spouse name: Not on file  . Number of children: Not on file  . Years of education: Not on file  . Highest education level: Not on file  Occupational History  . Occupation: retired    Fish farm manager: [employer]  Social Needs  . Financial resource strain: Not on file  . Food insecurity:    Worry: Not on file    Inability: Not on file  . Transportation needs:    Medical: Not on file    Non-medical: Not on file  Tobacco Use  . Smoking status: Never Smoker  . Smokeless tobacco: Never Used  Substance and Sexual Activity  . Alcohol use: No  . Drug use: No  . Sexual activity: Not on file  Lifestyle  . Physical activity:    Days per week: Not on file    Minutes per session: Not on file  . Stress: Not on file  Relationships  . Social connections:    Talks on phone: Not on file    Gets together: Not on file    Attends religious service: Not on file    Active member of club or organization: Not on file    Attends meetings of clubs or organizations: Not on file    Relationship status: Not on file  Other Topics Concern  . Not on file  Social History Narrative  . Not on file     Family History:  The patient's family history includes Heart disease in his mother; Valvular heart disease in his father.   ROS:   Please see the history of present illness.    ROS All other systems reviewed and are negative.   PHYSICAL EXAM:   VS:  BP (!) 162/88   Pulse 95   Ht 6\' 2"  (1.88 m)   Wt 155 lb 1.9 oz (70.4 kg)   SpO2 97%   BMI 19.92 kg/m    GEN: Well nourished, well developed, in no acute distress  HEENT: normal  Neck: no JVD, carotid bruits, or masses Cardiac: RRR; no murmurs, rubs, or gallops, trace bilateral lower extremity edema Respiratory:  clear to auscultation bilaterally, normal work of breathing GI: soft, nontender, nondistended, + BS MS: no deformity or  atrophy  Skin: warm and dry, no rash Neuro:  Alert and Oriented x 3, Strength and sensation are intact Psych: euthymic mood, full affect  Wt Readings from Last 3 Encounters:  05/30/17 155 lb 1.9 oz (70.4 kg)  03/07/17 138 lb (62.6 kg)  12/26/16 142 lb (64.4 kg)      Studies/Labs Reviewed:   EKG:  EKG is not ordered today.    Recent Labs: 06/12/2016: BNP 65.2 08/14/2016: TSH 1.720 12/19/2016: ALT 11 12/20/2016: Hemoglobin 12.0; Platelets 203 12/22/2016: BUN  77; Creatinine, Ser 3.43; Potassium 3.4; Sodium 145   Lipid Panel    Component Value Date/Time   CHOL 130 12/20/2015 0325   TRIG 51 12/20/2015 0325   HDL 27 (L) 12/20/2015 0325   CHOLHDL 4.8 12/20/2015 0325   VLDL 10 12/20/2015 0325   LDLCALC 93 12/20/2015 0325    Additional studies/ records that were reviewed today include:   As summarized above   ASSESSMENT & PLAN:    1. Chronic systolic heart failure - Last echocardiogram 12/2016 showed persisted low LVEF of 30-35% (40% on 12/2015; 35-40% on 05/2016) with grade 1 DD. No WM abnormality. Mild MR and mild dilated LA.  -   It is unclear if his cardiomyopathy is due to rapid atrial fibrillation versus underlying coronary artery disease. He had an abnormal stress test in the fall of 2017 however no cath has been performed due to lack of anginal symptoms as well as high risk for kidney failure. -Patient has intermittent lower extremity edema, orthopnea and PND.  Given severe kidney disease will defer addition and management of diuretics by nephrologist.  2.  Abnormal stress test in 2017 -As above.  I have reviewed with Dr. [name] today.  No plan for cardiac catheterization unless dialysis planned or severe anginal symptoms that not managed by medication.  3 chronic kidney disease stage IV.  - Per Dr. Welton Flakes  4.  Paroxysmal atrial fibrillation -Sinus rhythm by exam.  Coumadin per pharmacy.  Continue beta-blocker.  5.  Hypertensive heart disease -Blood pressure  elevated today.  For some reason he got hydralazine 10 mg 3 times a day from pharmacy instead of 25 mg.  He will resume hydralazine 25 mg 3 times daily and metoprolol 25 mg twice daily.  6.  Preoperative cardiac clearance - Given past medical history and time since last visit, based on ACC/AHA guidelines, Stephen Copeland would be at acceptable moderate to high risk risk for the planned procedure without further cardiovascular testing.  -Watch for volume overload.  CHADS2-VASc score of  5 (CHF, HTN, AGE, DM2, stroke/tia x 2, CAD, AGE, male)  Per office protocol, patient can hold warfarin for 5 days prior to procedure.   Patient will not need bridging with Lovenox (enoxaparin) around procedure.  I will route this recommendation to the requesting party via Epic fax function and remove from pre-op pool.   Medication Adjustments/Labs and Tests Ordered: Current medicines are reviewed at length with the patient today.  Concerns regarding medicines are outlined above.  Medication changes, Labs and Tests ordered today are listed in the Patient Instructions below. Patient Instructions  Your physician recommends that you continue on your current medications as directed. Please refer to the Current Medication list given to you today.   Your physician recommends that you schedule a follow-up appointment in:  [place]    Signed, [place], [place]  05/30/2017 9:11 AM    [address], [place], [place]  [address] Phone: [phone]; Fax: [phone]

## 2017-05-29 NOTE — Telephone Encounter (Signed)
Pt called Coumadin clinic looking to speak with Fraser Din. Advised pt I would forward to [facility] to return his call.

## 2017-05-29 NOTE — Telephone Encounter (Signed)
I spoke with pt who reports he was told to call and speak with me. He has since spoken with triage nurse as noted below.  Question was regarding hydralazine dose.  Pt does not know who prescribed hydralazine 25 mg.  I asked pt to bring all his medication bottles to appointment tomorrow. He does not have any other questions at this time.

## 2017-05-30 ENCOUNTER — Ambulatory Visit (INDEPENDENT_AMBULATORY_CARE_PROVIDER_SITE_OTHER): Payer: Medicare Other | Admitting: Physician Assistant

## 2017-05-30 VITALS — BP 162/88 | HR 95 | Ht 74.0 in | Wt 155.1 lb

## 2017-05-30 DIAGNOSIS — I10 Essential (primary) hypertension: Secondary | ICD-10-CM | POA: Diagnosis not present

## 2017-05-30 DIAGNOSIS — I5022 Chronic systolic (congestive) heart failure: Secondary | ICD-10-CM

## 2017-05-30 DIAGNOSIS — H01025 Squamous blepharitis left lower eyelid: Secondary | ICD-10-CM | POA: Diagnosis not present

## 2017-05-30 DIAGNOSIS — I480 Paroxysmal atrial fibrillation: Secondary | ICD-10-CM | POA: Diagnosis not present

## 2017-05-30 DIAGNOSIS — H10413 Chronic giant papillary conjunctivitis, bilateral: Secondary | ICD-10-CM | POA: Diagnosis not present

## 2017-05-30 DIAGNOSIS — I130 Hypertensive heart and chronic kidney disease with heart failure and stage 1 through stage 4 chronic kidney disease, or unspecified chronic kidney disease: Secondary | ICD-10-CM

## 2017-05-30 DIAGNOSIS — H01024 Squamous blepharitis left upper eyelid: Secondary | ICD-10-CM | POA: Diagnosis not present

## 2017-05-30 DIAGNOSIS — H01022 Squamous blepharitis right lower eyelid: Secondary | ICD-10-CM | POA: Diagnosis not present

## 2017-05-30 DIAGNOSIS — Z961 Presence of intraocular lens: Secondary | ICD-10-CM | POA: Diagnosis not present

## 2017-05-30 DIAGNOSIS — H40013 Open angle with borderline findings, low risk, bilateral: Secondary | ICD-10-CM | POA: Diagnosis not present

## 2017-05-30 DIAGNOSIS — R9439 Abnormal result of other cardiovascular function study: Secondary | ICD-10-CM | POA: Diagnosis not present

## 2017-05-30 DIAGNOSIS — H01021 Squamous blepharitis right upper eyelid: Secondary | ICD-10-CM | POA: Diagnosis not present

## 2017-05-30 MED ORDER — HYDRALAZINE HCL 25 MG PO TABS: 25.0000 mg | ORAL_TABLET | Freq: Three times a day (TID) | ORAL | 11 refills | Status: DC

## 2017-05-30 NOTE — Patient Instructions (Signed)
Your physician recommends that you continue on your current medications as directed. Please refer to the Current Medication list given to you today.   Your physician recommends that you schedule a follow-up appointment in:  [place]

## 2017-06-03 ENCOUNTER — Encounter: Payer: Self-pay | Admitting: Physician Assistant

## 2017-06-07 ENCOUNTER — Telehealth: Payer: Self-pay | Admitting: *Deleted

## 2017-06-07 DIAGNOSIS — R338 Other retention of urine: Secondary | ICD-10-CM | POA: Diagnosis not present

## 2017-06-07 DIAGNOSIS — I130 Hypertensive heart and chronic kidney disease with heart failure and stage 1 through stage 4 chronic kidney disease, or unspecified chronic kidney disease: Secondary | ICD-10-CM | POA: Diagnosis not present

## 2017-06-07 DIAGNOSIS — N183 Chronic kidney disease, stage 3 (moderate): Secondary | ICD-10-CM | POA: Diagnosis not present

## 2017-06-07 DIAGNOSIS — N401 Enlarged prostate with lower urinary tract symptoms: Secondary | ICD-10-CM | POA: Diagnosis not present

## 2017-06-07 DIAGNOSIS — Z466 Encounter for fitting and adjustment of urinary device: Secondary | ICD-10-CM | POA: Diagnosis not present

## 2017-06-07 DIAGNOSIS — I119 Hypertensive heart disease without heart failure: Secondary | ICD-10-CM | POA: Diagnosis not present

## 2017-06-07 DIAGNOSIS — I5022 Chronic systolic (congestive) heart failure: Secondary | ICD-10-CM | POA: Diagnosis not present

## 2017-06-07 NOTE — Telephone Encounter (Signed)
Pt called and said that the home health nurse came today to check his INR and the coumatrex machine was not working properly and she states will come back later today and he calls to inform us of this

## 2017-06-09 DIAGNOSIS — N401 Enlarged prostate with lower urinary tract symptoms: Secondary | ICD-10-CM | POA: Diagnosis not present

## 2017-06-09 DIAGNOSIS — N183 Chronic kidney disease, stage 3 (moderate): Secondary | ICD-10-CM | POA: Diagnosis not present

## 2017-06-09 DIAGNOSIS — I130 Hypertensive heart and chronic kidney disease with heart failure and stage 1 through stage 4 chronic kidney disease, or unspecified chronic kidney disease: Secondary | ICD-10-CM | POA: Diagnosis not present

## 2017-06-09 DIAGNOSIS — Z466 Encounter for fitting and adjustment of urinary device: Secondary | ICD-10-CM | POA: Diagnosis not present

## 2017-06-09 DIAGNOSIS — R338 Other retention of urine: Secondary | ICD-10-CM | POA: Diagnosis not present

## 2017-06-09 DIAGNOSIS — I5022 Chronic systolic (congestive) heart failure: Secondary | ICD-10-CM | POA: Diagnosis not present

## 2017-06-10 ENCOUNTER — Telehealth: Payer: Self-pay | Admitting: *Deleted

## 2017-06-10 NOTE — Telephone Encounter (Signed)
Stephen Copeland with [facility] called to report the labs were drawn on Friday & they haven't received the results at this time & once they do they will send to [place] & they have the fax number. She is unsure when the labs will be received but hoping today. Will await for the results to be sent to [place].

## 2017-06-10 NOTE — Telephone Encounter (Signed)
Received a message on the voicemail from [place] with [facility] on April 5th at 502pm stating that they had to complete a venipuncture on the pt to obtain an INR. Returned call to [employer] & had to leave a message stating to call back regarding the pt since we have not received any INR results. Will await a call back.

## 2017-06-13 ENCOUNTER — Ambulatory Visit (INDEPENDENT_AMBULATORY_CARE_PROVIDER_SITE_OTHER): Payer: Medicare Other | Admitting: Cardiology

## 2017-06-13 DIAGNOSIS — Z466 Encounter for fitting and adjustment of urinary device: Secondary | ICD-10-CM | POA: Diagnosis not present

## 2017-06-13 DIAGNOSIS — I482 Chronic atrial fibrillation, unspecified: Secondary | ICD-10-CM

## 2017-06-13 DIAGNOSIS — I5022 Chronic systolic (congestive) heart failure: Secondary | ICD-10-CM | POA: Diagnosis not present

## 2017-06-13 DIAGNOSIS — R338 Other retention of urine: Secondary | ICD-10-CM | POA: Diagnosis not present

## 2017-06-13 DIAGNOSIS — Z5181 Encounter for therapeutic drug level monitoring: Secondary | ICD-10-CM | POA: Diagnosis not present

## 2017-06-13 DIAGNOSIS — N401 Enlarged prostate with lower urinary tract symptoms: Secondary | ICD-10-CM | POA: Diagnosis not present

## 2017-06-13 DIAGNOSIS — I130 Hypertensive heart and chronic kidney disease with heart failure and stage 1 through stage 4 chronic kidney disease, or unspecified chronic kidney disease: Secondary | ICD-10-CM | POA: Diagnosis not present

## 2017-06-13 DIAGNOSIS — N183 Chronic kidney disease, stage 3 (moderate): Secondary | ICD-10-CM | POA: Diagnosis not present

## 2017-06-13 LAB — POCT INR: INR: 1.6

## 2017-06-13 NOTE — Patient Instructions (Signed)
Description   Spoke with Stephen Copeland with [facility] while in home with patient and advised to have patient take 1.5 tablets (7.5mg ) today then start taking take 1 tablet (5mg ) daily except 1.5 tablets (7.5mg ) on Sundays and Thursdays.  Orders given to [place] with [facility] to recheck in 1 week.

## 2017-06-13 NOTE — Telephone Encounter (Signed)
Spoke with Stephen Copeland who reports that Medical records never received result and [facility] is reporting that labs were transferred to [facility]. Thus she is not able to track down results.   Verbal order given to have POCT INR drawn today and called to our office. She will call if unable to obtain INR today as she is calling patient now to set up visit. Will await results from today.

## 2017-06-18 ENCOUNTER — Ambulatory Visit (INDEPENDENT_AMBULATORY_CARE_PROVIDER_SITE_OTHER): Payer: Medicare Other | Admitting: Internal Medicine

## 2017-06-18 DIAGNOSIS — N183 Chronic kidney disease, stage 3 (moderate): Secondary | ICD-10-CM | POA: Diagnosis not present

## 2017-06-18 DIAGNOSIS — I482 Chronic atrial fibrillation, unspecified: Secondary | ICD-10-CM

## 2017-06-18 DIAGNOSIS — Z466 Encounter for fitting and adjustment of urinary device: Secondary | ICD-10-CM | POA: Diagnosis not present

## 2017-06-18 DIAGNOSIS — Z5181 Encounter for therapeutic drug level monitoring: Secondary | ICD-10-CM | POA: Diagnosis not present

## 2017-06-18 DIAGNOSIS — I5022 Chronic systolic (congestive) heart failure: Secondary | ICD-10-CM | POA: Diagnosis not present

## 2017-06-18 DIAGNOSIS — R338 Other retention of urine: Secondary | ICD-10-CM | POA: Diagnosis not present

## 2017-06-18 DIAGNOSIS — N401 Enlarged prostate with lower urinary tract symptoms: Secondary | ICD-10-CM | POA: Diagnosis not present

## 2017-06-18 DIAGNOSIS — I130 Hypertensive heart and chronic kidney disease with heart failure and stage 1 through stage 4 chronic kidney disease, or unspecified chronic kidney disease: Secondary | ICD-10-CM | POA: Diagnosis not present

## 2017-06-18 LAB — POCT INR: INR: 1.3

## 2017-06-24 DIAGNOSIS — N99820 Postprocedural hemorrhage and hematoma of a genitourinary system organ or structure following a genitourinary system procedure: Secondary | ICD-10-CM | POA: Diagnosis not present

## 2017-06-24 DIAGNOSIS — C61 Malignant neoplasm of prostate: Secondary | ICD-10-CM | POA: Diagnosis not present

## 2017-06-24 DIAGNOSIS — N138 Other obstructive and reflux uropathy: Secondary | ICD-10-CM | POA: Diagnosis not present

## 2017-06-24 DIAGNOSIS — N401 Enlarged prostate with lower urinary tract symptoms: Secondary | ICD-10-CM | POA: Diagnosis not present

## 2017-06-24 DIAGNOSIS — R310 Gross hematuria: Secondary | ICD-10-CM | POA: Diagnosis not present

## 2017-06-24 DIAGNOSIS — R339 Retention of urine, unspecified: Secondary | ICD-10-CM | POA: Diagnosis not present

## 2017-06-25 DIAGNOSIS — Z79899 Other long term (current) drug therapy: Secondary | ICD-10-CM | POA: Diagnosis not present

## 2017-06-25 DIAGNOSIS — I519 Heart disease, unspecified: Secondary | ICD-10-CM | POA: Diagnosis not present

## 2017-06-25 DIAGNOSIS — R918 Other nonspecific abnormal finding of lung field: Secondary | ICD-10-CM | POA: Diagnosis not present

## 2017-06-25 DIAGNOSIS — I255 Ischemic cardiomyopathy: Secondary | ICD-10-CM | POA: Diagnosis present

## 2017-06-25 DIAGNOSIS — I130 Hypertensive heart and chronic kidney disease with heart failure and stage 1 through stage 4 chronic kidney disease, or unspecified chronic kidney disease: Secondary | ICD-10-CM | POA: Diagnosis present

## 2017-06-25 DIAGNOSIS — N99820 Postprocedural hemorrhage and hematoma of a genitourinary system organ or structure following a genitourinary system procedure: Secondary | ICD-10-CM | POA: Diagnosis present

## 2017-06-25 DIAGNOSIS — R55 Syncope and collapse: Secondary | ICD-10-CM | POA: Diagnosis present

## 2017-06-25 DIAGNOSIS — I5032 Chronic diastolic (congestive) heart failure: Secondary | ICD-10-CM | POA: Diagnosis present

## 2017-06-25 DIAGNOSIS — I2510 Atherosclerotic heart disease of native coronary artery without angina pectoris: Secondary | ICD-10-CM | POA: Diagnosis present

## 2017-06-25 DIAGNOSIS — I4891 Unspecified atrial fibrillation: Secondary | ICD-10-CM | POA: Diagnosis not present

## 2017-06-25 DIAGNOSIS — F329 Major depressive disorder, single episode, unspecified: Secondary | ICD-10-CM | POA: Diagnosis present

## 2017-06-25 DIAGNOSIS — D649 Anemia, unspecified: Secondary | ICD-10-CM | POA: Diagnosis present

## 2017-06-25 DIAGNOSIS — N390 Urinary tract infection, site not specified: Secondary | ICD-10-CM | POA: Diagnosis not present

## 2017-06-25 DIAGNOSIS — N401 Enlarged prostate with lower urinary tract symptoms: Secondary | ICD-10-CM | POA: Diagnosis present

## 2017-06-25 DIAGNOSIS — N289 Disorder of kidney and ureter, unspecified: Secondary | ICD-10-CM | POA: Diagnosis not present

## 2017-06-25 DIAGNOSIS — J9811 Atelectasis: Secondary | ICD-10-CM | POA: Diagnosis not present

## 2017-06-25 DIAGNOSIS — Z8701 Personal history of pneumonia (recurrent): Secondary | ICD-10-CM | POA: Diagnosis not present

## 2017-06-25 DIAGNOSIS — N184 Chronic kidney disease, stage 4 (severe): Secondary | ICD-10-CM | POA: Diagnosis present

## 2017-06-25 DIAGNOSIS — R310 Gross hematuria: Secondary | ICD-10-CM | POA: Diagnosis present

## 2017-06-25 DIAGNOSIS — Z8614 Personal history of Methicillin resistant Staphylococcus aureus infection: Secondary | ICD-10-CM | POA: Diagnosis not present

## 2017-06-25 DIAGNOSIS — I071 Rheumatic tricuspid insufficiency: Secondary | ICD-10-CM | POA: Diagnosis not present

## 2017-06-25 DIAGNOSIS — R9430 Abnormal result of cardiovascular function study, unspecified: Secondary | ICD-10-CM | POA: Diagnosis not present

## 2017-06-25 DIAGNOSIS — Z7901 Long term (current) use of anticoagulants: Secondary | ICD-10-CM | POA: Diagnosis not present

## 2017-06-25 DIAGNOSIS — I340 Nonrheumatic mitral (valve) insufficiency: Secondary | ICD-10-CM | POA: Diagnosis not present

## 2017-06-25 DIAGNOSIS — N189 Chronic kidney disease, unspecified: Secondary | ICD-10-CM | POA: Diagnosis not present

## 2017-06-25 DIAGNOSIS — R338 Other retention of urine: Secondary | ICD-10-CM | POA: Diagnosis not present

## 2017-06-25 DIAGNOSIS — I480 Paroxysmal atrial fibrillation: Secondary | ICD-10-CM | POA: Diagnosis present

## 2017-06-25 DIAGNOSIS — R509 Fever, unspecified: Secondary | ICD-10-CM | POA: Diagnosis not present

## 2017-06-25 DIAGNOSIS — R0989 Other specified symptoms and signs involving the circulatory and respiratory systems: Secondary | ICD-10-CM | POA: Diagnosis not present

## 2017-06-27 DIAGNOSIS — N3001 Acute cystitis with hematuria: Secondary | ICD-10-CM | POA: Diagnosis not present

## 2017-06-27 DIAGNOSIS — N138 Other obstructive and reflux uropathy: Secondary | ICD-10-CM | POA: Diagnosis not present

## 2017-06-27 DIAGNOSIS — I77810 Thoracic aortic ectasia: Secondary | ICD-10-CM | POA: Diagnosis not present

## 2017-06-27 DIAGNOSIS — I509 Heart failure, unspecified: Secondary | ICD-10-CM | POA: Diagnosis not present

## 2017-06-27 DIAGNOSIS — N179 Acute kidney failure, unspecified: Secondary | ICD-10-CM | POA: Diagnosis not present

## 2017-06-27 DIAGNOSIS — I130 Hypertensive heart and chronic kidney disease with heart failure and stage 1 through stage 4 chronic kidney disease, or unspecified chronic kidney disease: Secondary | ICD-10-CM | POA: Diagnosis present

## 2017-06-27 DIAGNOSIS — N189 Chronic kidney disease, unspecified: Secondary | ICD-10-CM | POA: Diagnosis not present

## 2017-06-27 DIAGNOSIS — M6281 Muscle weakness (generalized): Secondary | ICD-10-CM | POA: Diagnosis not present

## 2017-06-27 DIAGNOSIS — J9811 Atelectasis: Secondary | ICD-10-CM | POA: Diagnosis present

## 2017-06-27 DIAGNOSIS — I089 Rheumatic multiple valve disease, unspecified: Secondary | ICD-10-CM | POA: Diagnosis not present

## 2017-06-27 DIAGNOSIS — B9562 Methicillin resistant Staphylococcus aureus infection as the cause of diseases classified elsewhere: Secondary | ICD-10-CM | POA: Diagnosis not present

## 2017-06-27 DIAGNOSIS — Z79899 Other long term (current) drug therapy: Secondary | ICD-10-CM | POA: Diagnosis not present

## 2017-06-27 DIAGNOSIS — I480 Paroxysmal atrial fibrillation: Secondary | ICD-10-CM | POA: Diagnosis not present

## 2017-06-27 DIAGNOSIS — B965 Pseudomonas (aeruginosa) (mallei) (pseudomallei) as the cause of diseases classified elsewhere: Secondary | ICD-10-CM | POA: Diagnosis not present

## 2017-06-27 DIAGNOSIS — N183 Chronic kidney disease, stage 3 (moderate): Secondary | ICD-10-CM | POA: Diagnosis present

## 2017-06-27 DIAGNOSIS — I2510 Atherosclerotic heart disease of native coronary artery without angina pectoris: Secondary | ICD-10-CM | POA: Diagnosis not present

## 2017-06-27 DIAGNOSIS — I129 Hypertensive chronic kidney disease with stage 1 through stage 4 chronic kidney disease, or unspecified chronic kidney disease: Secondary | ICD-10-CM | POA: Diagnosis not present

## 2017-06-27 DIAGNOSIS — A4152 Sepsis due to Pseudomonas: Secondary | ICD-10-CM | POA: Diagnosis not present

## 2017-06-27 DIAGNOSIS — N184 Chronic kidney disease, stage 4 (severe): Secondary | ICD-10-CM | POA: Diagnosis not present

## 2017-06-27 DIAGNOSIS — A4101 Sepsis due to Methicillin susceptible Staphylococcus aureus: Secondary | ICD-10-CM | POA: Diagnosis not present

## 2017-06-27 DIAGNOSIS — R7881 Bacteremia: Secondary | ICD-10-CM | POA: Diagnosis not present

## 2017-06-27 DIAGNOSIS — A4102 Sepsis due to Methicillin resistant Staphylococcus aureus: Secondary | ICD-10-CM | POA: Diagnosis not present

## 2017-06-27 DIAGNOSIS — I38 Endocarditis, valve unspecified: Secondary | ICD-10-CM | POA: Diagnosis not present

## 2017-06-27 DIAGNOSIS — I255 Ischemic cardiomyopathy: Secondary | ICD-10-CM | POA: Diagnosis present

## 2017-06-27 DIAGNOSIS — I5022 Chronic systolic (congestive) heart failure: Secondary | ICD-10-CM | POA: Diagnosis present

## 2017-06-27 DIAGNOSIS — I4891 Unspecified atrial fibrillation: Secondary | ICD-10-CM | POA: Diagnosis not present

## 2017-06-27 DIAGNOSIS — R338 Other retention of urine: Secondary | ICD-10-CM | POA: Diagnosis not present

## 2017-06-27 DIAGNOSIS — D62 Acute posthemorrhagic anemia: Secondary | ICD-10-CM | POA: Diagnosis not present

## 2017-06-27 DIAGNOSIS — R488 Other symbolic dysfunctions: Secondary | ICD-10-CM | POA: Diagnosis not present

## 2017-06-27 DIAGNOSIS — Z452 Encounter for adjustment and management of vascular access device: Secondary | ICD-10-CM | POA: Diagnosis not present

## 2017-06-27 DIAGNOSIS — R269 Unspecified abnormalities of gait and mobility: Secondary | ICD-10-CM | POA: Diagnosis not present

## 2017-06-27 DIAGNOSIS — Z7901 Long term (current) use of anticoagulants: Secondary | ICD-10-CM | POA: Diagnosis not present

## 2017-06-27 DIAGNOSIS — N390 Urinary tract infection, site not specified: Secondary | ICD-10-CM | POA: Diagnosis not present

## 2017-06-27 DIAGNOSIS — N401 Enlarged prostate with lower urinary tract symptoms: Secondary | ICD-10-CM | POA: Diagnosis not present

## 2017-06-27 DIAGNOSIS — I517 Cardiomegaly: Secondary | ICD-10-CM | POA: Diagnosis not present

## 2017-06-27 DIAGNOSIS — R55 Syncope and collapse: Secondary | ICD-10-CM | POA: Diagnosis not present

## 2017-06-27 DIAGNOSIS — F329 Major depressive disorder, single episode, unspecified: Secondary | ICD-10-CM | POA: Diagnosis present

## 2017-06-27 DIAGNOSIS — N289 Disorder of kidney and ureter, unspecified: Secondary | ICD-10-CM | POA: Diagnosis not present

## 2017-06-27 LAB — BASIC METABOLIC PANEL
BUN: 40 — AB (ref 4–21)
BUN: 44 — AB (ref 4–21)
CREATININE: 2.2 — AB (ref 0.6–1.3)
Creatinine: 2.3 — AB (ref 0.6–1.3)
GLUCOSE: 118
Glucose: 102
POTASSIUM: 4.3 (ref 3.4–5.3)
Potassium: 3.2 — AB (ref 3.4–5.3)
SODIUM: 141 (ref 137–147)
Sodium: 142 (ref 137–147)

## 2017-06-27 LAB — CBC AND DIFFERENTIAL
HCT: 29 — AB (ref 41–53)
HCT: 27 — AB (ref 41–53)
HEMOGLOBIN: 8.4 — AB (ref 13.5–17.5)
Hemoglobin: 9.2 — AB (ref 13.5–17.5)
Platelets: 177 (ref 150–399)
Platelets: 174 (ref 150–399)
WBC: 11.3
WBC: 20.5

## 2017-07-04 DIAGNOSIS — N138 Other obstructive and reflux uropathy: Secondary | ICD-10-CM | POA: Diagnosis not present

## 2017-07-04 DIAGNOSIS — I509 Heart failure, unspecified: Secondary | ICD-10-CM | POA: Diagnosis not present

## 2017-07-04 DIAGNOSIS — B965 Pseudomonas (aeruginosa) (mallei) (pseudomallei) as the cause of diseases classified elsewhere: Secondary | ICD-10-CM | POA: Diagnosis not present

## 2017-07-04 DIAGNOSIS — M6281 Muscle weakness (generalized): Secondary | ICD-10-CM | POA: Diagnosis not present

## 2017-07-04 DIAGNOSIS — A4101 Sepsis due to Methicillin susceptible Staphylococcus aureus: Secondary | ICD-10-CM | POA: Diagnosis not present

## 2017-07-04 DIAGNOSIS — I2510 Atherosclerotic heart disease of native coronary artery without angina pectoris: Secondary | ICD-10-CM | POA: Diagnosis not present

## 2017-07-04 DIAGNOSIS — R55 Syncope and collapse: Secondary | ICD-10-CM | POA: Diagnosis not present

## 2017-07-04 DIAGNOSIS — N401 Enlarged prostate with lower urinary tract symptoms: Secondary | ICD-10-CM | POA: Diagnosis not present

## 2017-07-04 DIAGNOSIS — R7881 Bacteremia: Secondary | ICD-10-CM | POA: Diagnosis not present

## 2017-07-04 DIAGNOSIS — N3001 Acute cystitis with hematuria: Secondary | ICD-10-CM | POA: Diagnosis not present

## 2017-07-04 DIAGNOSIS — R269 Unspecified abnormalities of gait and mobility: Secondary | ICD-10-CM | POA: Diagnosis not present

## 2017-07-04 DIAGNOSIS — R338 Other retention of urine: Secondary | ICD-10-CM | POA: Diagnosis not present

## 2017-07-04 DIAGNOSIS — R488 Other symbolic dysfunctions: Secondary | ICD-10-CM | POA: Diagnosis not present

## 2017-07-04 DIAGNOSIS — N390 Urinary tract infection, site not specified: Secondary | ICD-10-CM | POA: Diagnosis not present

## 2017-07-04 DIAGNOSIS — A4152 Sepsis due to Pseudomonas: Secondary | ICD-10-CM | POA: Diagnosis not present

## 2017-07-04 DIAGNOSIS — N184 Chronic kidney disease, stage 4 (severe): Secondary | ICD-10-CM | POA: Diagnosis not present

## 2017-07-04 DIAGNOSIS — J9811 Atelectasis: Secondary | ICD-10-CM | POA: Diagnosis not present

## 2017-07-04 DIAGNOSIS — I4891 Unspecified atrial fibrillation: Secondary | ICD-10-CM | POA: Diagnosis not present

## 2017-07-04 DIAGNOSIS — Z9079 Acquired absence of other genital organ(s): Secondary | ICD-10-CM | POA: Diagnosis not present

## 2017-07-04 DIAGNOSIS — N179 Acute kidney failure, unspecified: Secondary | ICD-10-CM | POA: Diagnosis not present

## 2017-07-04 DIAGNOSIS — I480 Paroxysmal atrial fibrillation: Secondary | ICD-10-CM | POA: Diagnosis not present

## 2017-07-04 DIAGNOSIS — I255 Ischemic cardiomyopathy: Secondary | ICD-10-CM | POA: Diagnosis not present

## 2017-07-04 DIAGNOSIS — I129 Hypertensive chronic kidney disease with stage 1 through stage 4 chronic kidney disease, or unspecified chronic kidney disease: Secondary | ICD-10-CM | POA: Diagnosis not present

## 2017-07-04 DIAGNOSIS — N183 Chronic kidney disease, stage 3 (moderate): Secondary | ICD-10-CM | POA: Diagnosis not present

## 2017-07-04 DIAGNOSIS — B9562 Methicillin resistant Staphylococcus aureus infection as the cause of diseases classified elsewhere: Secondary | ICD-10-CM | POA: Diagnosis not present

## 2017-07-04 DIAGNOSIS — I25118 Atherosclerotic heart disease of native coronary artery with other forms of angina pectoris: Secondary | ICD-10-CM | POA: Diagnosis not present

## 2017-07-04 DIAGNOSIS — I10 Essential (primary) hypertension: Secondary | ICD-10-CM | POA: Diagnosis not present

## 2017-07-05 ENCOUNTER — Encounter: Payer: Self-pay | Admitting: Internal Medicine

## 2017-07-05 ENCOUNTER — Non-Acute Institutional Stay (SKILLED_NURSING_FACILITY): Payer: Medicare Other | Admitting: Internal Medicine

## 2017-07-05 DIAGNOSIS — N390 Urinary tract infection, site not specified: Secondary | ICD-10-CM | POA: Diagnosis not present

## 2017-07-05 DIAGNOSIS — N401 Enlarged prostate with lower urinary tract symptoms: Secondary | ICD-10-CM

## 2017-07-05 DIAGNOSIS — I4891 Unspecified atrial fibrillation: Secondary | ICD-10-CM | POA: Diagnosis not present

## 2017-07-05 DIAGNOSIS — R7881 Bacteremia: Secondary | ICD-10-CM

## 2017-07-05 DIAGNOSIS — N138 Other obstructive and reflux uropathy: Secondary | ICD-10-CM

## 2017-07-05 DIAGNOSIS — B965 Pseudomonas (aeruginosa) (mallei) (pseudomallei) as the cause of diseases classified elsewhere: Secondary | ICD-10-CM

## 2017-07-05 DIAGNOSIS — I480 Paroxysmal atrial fibrillation: Secondary | ICD-10-CM

## 2017-07-05 DIAGNOSIS — A4152 Sepsis due to Pseudomonas: Secondary | ICD-10-CM | POA: Diagnosis not present

## 2017-07-05 DIAGNOSIS — I25118 Atherosclerotic heart disease of native coronary artery with other forms of angina pectoris: Secondary | ICD-10-CM

## 2017-07-05 DIAGNOSIS — I10 Essential (primary) hypertension: Secondary | ICD-10-CM

## 2017-07-05 NOTE — Progress Notes (Signed)
: Provider:  Noah Delaine. Sheppard Coil, MD Location:  [place] Room Number: 191Y Place of Service:  SNF (31)  PCP: Patient, No Pcp Per Patient Care Team: Patient, No Pcp Per as PCP - General ([place]) Burnell Blanks, MD as PCP - Cardiology (Cardiology)  Extended Emergency Contact Information Primary Emergency Contact: Albertha Ghee States of [place] Phone: [phone] Mobile Phone: [phone] Relation: Spouse     Allergies: Patient has no known allergies.  Chief Complaint  Patient presents with  . New Admit To SNF    Admit to Facility     HPI: Patient is 77 y.o. male congestive heart failure, coronary artery disease, hypertension, paroxysmal atrial fibrillation, and chronic kidney disease who is a direct admit to [facility] after patient had a vasovagal vagal response status post TURP at [facility] on 06/24/2017.  Patient underwent TURP followed by take back to the OR for bleeding.  He was then admitted to the surgical floor for routine postop care.  Postop course was uneventful until removal of his Foley yesterday postop day 2.  Throughout the course of the day he was having difficulty urinating and retaining urine but refused catheter placement.  During the night he spiked a fever of 103.1 and went into atrial fib with RVR.  Is heart rate responded to a dose of metoprolol and blood cultures were drawn and cardiac enzymes were obtained as well.  Letter in the morning patient was having a bowel movement and voiding in the commode when he had a vasovagal emphasis episode and a CODE BLUE was called.  Patient was placed in Trendelenburg and placed on oxygen and his mental status slowly return.  A Foley catheter was placed at that time cardiac enzymes came back a slightly elevated troponin of 0.103.  EKG system signs worrisome for left bundle branch block which was not new patient was transferred then to  [facility] for higher level of care.  Patient was admitted to [facility] from 4/23-5/2.  Patient was admitted, placed on cardiac monitor and cardiology was consulted.  Patient is atrial fib with RVR returned to normal sinus rhythm.  Blood cultures were positive for MRSA bacteremia as well as Pseudomonas bacteremia.  Infectious disease consult was obtained and they adjusted to the IV antibiotics appropriately TEE was performed.  Blood cultures were negative cardiology followed and eventually signed off and no further recommendations arrangements were made for her to quit patient to continue IV antibiotics as outpatient infectious disease is recommending an date for cefepime to be 07/13/2017 and vancomycin and date 5/24.  Patient is admitted to skilled nursing facility for OT/PT.  Patient admitted to skilled nursing facility for OT/PT.  While at skilled nursing facility patient will be followed for hypertension treated with hydralazine and metoprolol, very artery disease treated with metoprolol Coumadin PRN nitroglycerin and atrial fib treated with Toprol and Coumadin.  Past Medical History:  Diagnosis Date  . Acid reflux   . CHF (congestive heart failure) ([place])   . Coronary artery disease   . Folliculitis   . Gastric ulcer   . Hiatal hernia   . Hyperlipidemia    a. pt is adamantly against statins.  . Hypertension   . Ischemia    a. Pt states he was diagnosed with "ischemia" in the 1990s but does not know further details, denies hx of heart blockage.  . Nummular dermatitis   . Renal disorder   .  Scoliosis   . Sinus bradycardia   . Stomach ulcer    a. remote ulcer in the 1990s, no hx of bleeding.    Past Surgical History:  Procedure Laterality Date  . INGUINAL HERNIA REPAIR Left     Allergies as of 07/05/2017   No Known Allergies     Medication List        Accurate as of 07/05/17 10:15 AM. Always use your most recent med list.          ferrous sulfate 325  (65 FE) MG tablet Take 325 mg by mouth 2 (two) times daily with a meal.   hydrALAZINE 25 MG tablet Commonly known as:  APRESOLINE Take 1 tablet (25 mg total) by mouth every 8 (eight) hours.   HYDROcodone-acetaminophen 5-325 MG tablet Commonly known as:  NORCO/VICODIN Take 1 tablet by mouth every 6 (six) hours as needed.   hydrocortisone cream 1 % Apply 1 application topically 2 (two) times daily.   KCl (in NaCl 0.9%) 10 MEQ/100ML Soln Inject 2 g into the vein daily.   lidocaine 5 % ointment Commonly known as:  XYLOCAINE Apply 1 application topically as needed.   metoprolol tartrate 25 MG tablet Commonly known as:  LOPRESSOR Take 25 mg by mouth 2 (two) times daily.   nitroGLYCERIN 0.4 MG SL tablet Commonly known as:  NITROSTAT Place under the tongue every 5 (five) minutes as needed for chest pain.   PROSCAR 5 MG tablet Generic drug:  finasteride Take 5 mg by mouth daily.   tamsulosin 0.4 MG Caps capsule Commonly known as:  FLOMAX Take 0.4 mg by mouth daily after supper.   Vancomycin HCl in NaCl 1.25-0.9 GM/250ML-% Soln Inject 250 mLs (1.25 g dose) into the vein daily for 23 days   warfarin 5 MG tablet Commonly known as:  COUMADIN Take as directed by the anticoagulation clinic. If you are unsure how to take this medication, talk to your nurse or doctor. Original instructions:  TAKE 1 TABLET DAILY OR AS DIRECTED BY COUMADIN CLINIC (ELIQUIS WILL BE STOPPED)       No orders of the defined types were placed in this encounter.    There is no immunization history on file for this patient.  Social History   Tobacco Use  . Smoking status: Never Smoker  . Smokeless tobacco: Never Used  Substance Use Topics  . Alcohol use: No    Family history is   Family History  Problem Relation Age of Onset  . Heart disease Mother        Further details not reported, died at age 32  . Valvular heart disease Father        H/o MV surgery, diet at age 77      Review of  Systems  DATA OBTAINED: from patient, nurse GENERAL:  no fevers, fatigue, appetite changes SKIN: No itching, or rash EYES: No eye pain, redness, discharge EARS: No earache, tinnitus, change in hearing NOSE: No congestion, drainage or bleeding  MOUTH/THROAT: No mouth or tooth pain, No sore throat RESPIRATORY: No cough, wheezing, SOB CARDIAC: No chest pain, palpitations, lower extremity edema  GI: No abdominal pain, No N/V/D or constipation, No heartburn or reflux  GU: No dysuria, frequency or urgency, or incontinence  MUSCULOSKELETAL: No unrelieved bone/joint pain NEUROLOGIC: No headache, dizziness or focal weakness PSYCHIATRIC: No c/o anxiety or sadness   Vitals:   07/05/17 0927  BP: 138/75  Pulse: 77  Resp: (!) 22  Temp: (!) 97.1 F (  36.2 C)  SpO2: 94%    SpO2 Readings from Last 1 Encounters:  07/05/17 94%   Body mass index is 19.38 kg/m.     Physical Exam  GENERAL APPEARANCE: Alert, conversant,  No acute distress.  SKIN: No diaphoresis rash HEAD: Normocephalic, atraumatic  EYES: Conjunctiva/lids clear. Pupils round, reactive. EOMs intact.  EARS: External exam WNL, canals clear. Hearing grossly normal.  NOSE: No deformity or discharge.  MOUTH/THROAT: Lips w/o lesions  RESPIRATORY: Breathing is even, unlabored. Lung sounds are clear   CARDIOVASCULAR: Heart RRR no murmurs, rubs or gallops. No peripheral edema.   GASTROINTESTINAL: Abdomen is soft, non-tender, not distended w/ normal bowel sounds. GENITOURINARY: Bladder non tender, not distended; Foley to straight drain MUSCULOSKELETAL: No abnormal joints or musculature NEUROLOGIC:  Cranial nerves 2-12 grossly intact. Moves all extremities  PSYCHIATRIC: Mood and affect appropriate to situation, no behavioral issues  Patient Active Problem List   Diagnosis Date Noted  . Encounter for therapeutic drug monitoring 04/09/2017  . Right lower lobe pulmonary nodule 12/19/2016  . Noncompliance with treatment plan  12/19/2016  . Bacteremia due to Enterococcus 12/19/2016  . Enterococcus UTI 12/19/2016  . Acute renal failure (ARF) ([place]) 12/18/2016  . Hyperkalemia 12/18/2016  . Hyperbilirubinemia 12/18/2016  . UTI (urinary tract infection) 05/31/2016  . Acute urinary retention 05/31/2016  . Acute kidney injury superimposed on chronic kidney disease ([place]) 05/31/2016  . Generalized weakness 05/31/2016  . Falls 05/31/2016  . Acute systolic CHF (congestive heart failure) ([place])   . Congestive dilated cardiomyopathy ([place])   . Atrial fibrillation with RVR ([place])   . Uncontrolled hypertension   . Pulmonary hypertension ([place])   . Chronic systolic CHF (congestive heart failure) ([place])   . Atrial fibrillation ([place]) 12/19/2015  . Hypertension 12/19/2015  . New onset a-fib ([place]) 12/19/2015  . Acute CHF ([place]) 12/19/2015  . Hyperglycemia 12/19/2015  . Acid reflux   . Hyperlipidemia   . Sinus bradycardia   . Gastroesophageal reflux disease   . Other hyperlipidemia   . Elevated troponin       Labs reviewed: Basic Metabolic Panel:    Component Value Date/Time   NA 141 06/27/2017 0731   K 3.2 (A) 06/27/2017 0731   CL 113 (H) 12/22/2016 0432   CO2 21 (L) 12/22/2016 0432   GLUCOSE 119 (H) 12/22/2016 0432   BUN 44 (A) 06/27/2017 0731   CREATININE 2.3 (A) 06/27/2017 0731   CREATININE 3.43 (H) 12/22/2016 0432   CREATININE 1.15 01/30/2016 0949   CALCIUM 8.4 (L) 12/22/2016 0432   PROT 6.8 12/19/2016 0432   PROT 6.5 08/22/2016 1049   ALBUMIN 3.5 12/19/2016 0432   ALBUMIN 4.0 08/22/2016 1049   AST 12 (L) 12/19/2016 0432   ALT 11 (L) 12/19/2016 0432   ALKPHOS 87 12/19/2016 0432   BILITOT 1.5 (H) 12/19/2016 0432   BILITOT 0.8 08/22/2016 1049   GFRNONAA 16 (L) 12/22/2016 0432   GFRAA 19 (L) 12/22/2016 0432    Recent Labs    12/20/16 0457 12/21/16 0433 12/22/16 0432 06/27/17 0429 06/27/17 0731  NA 145 144 145 142 141  K 3.8 3.3* 3.4* 4.3 3.2*  CL 108 107 113*  --   --   CO2 20* 24 21*  --   --     GLUCOSE 97 93 119*  --   --   BUN 96* 87* 77* 40* 44*  CREATININE 5.81* 4.55* 3.43* 2.2* 2.3*  CALCIUM 9.0 8.9 8.4*  --   --    Liver  Function Tests: Recent Labs    08/22/16 1049 12/18/16 1615 12/19/16 0432  AST 18 16 12*  ALT 20 14* 11*  ALKPHOS 146* 107 87  BILITOT 0.8 1.7* 1.5*  PROT 6.5 7.8 6.8  ALBUMIN 4.0 4.3 3.5   No results for input(s): LIPASE, AMYLASE in the last 8760 hours. No results for input(s): AMMONIA in the last 8760 hours. CBC: Recent Labs    12/18/16 1615 12/19/16 0432 12/20/16 0457 06/27/17 0429 06/27/17 0847  WBC 19.9* 17.9* 9.4 11.3 20.5  HGB 13.5 11.1* 12.0* 9.2* 8.4*  HCT 39.5 33.3* 36.0* 29* 27*  MCV 93.6 92.8 93.3  --   --   PLT 223 189 203 177 174   Lipid No results for input(s): CHOL, HDL, LDLCALC, TRIG in the last 8760 hours.  Cardiac Enzymes: Recent Labs    12/18/16 1615 12/19/16 0432  TROPONINI 0.07* 0.08*   BNP: No results for input(s): BNP in the last 8760 hours. No results found for: [facility] Lab Results  Component Value Date   HGBA1C 5.7 (H) 05/31/2016   Lab Results  Component Value Date   TSH 1.720 08/14/2016   No results found for: VITAMINB12 No results found for: FOLATE No results found for: IRON, TIBC, FERRITIN  Imaging and Procedures obtained prior to SNF admission: No results found.   Not all labs, radiology exams or other studies done during hospitalization come through on my EPIC note; however they are reviewed by me.    Assessment and Plan  Vasovagal syncope status post TURP-precipitated patient's admission to [facility], patient had been at [place] facility: Patient came back on his own with Trendelenburg and O2  Atrial fibrillation with RVR-which occurred shortly after patient with had his vasovagal syncope patient is known to have atrial fibrillation patient's RVR resolved on its own after patient was admitted to [facility] SNF -continue metoprolol 25 mg  twice daily and warfarin 5 mg daily as prophylaxis; warfarin will be actively titrated  MRSA bacteremia/Pseudomonas bacteremia/Pseudomonas UTI-sometime after patient had a syncopal patient spiked a fever; blood cultures grew out MRSA, urine Pseudomonas; TEE was performed which was negative infectious disease recommends cefepime until 07/13/2017 and vancomycin until 07/26/2017 SNF -admitted for OT/PT and IV antibiotics as above  Hypertension SNF -controlled; continue hydralazine 25 mg every 8 and metoprolol 25 mg twice daily  CAD SNF -no reports of chest pain with syncope or atrial fib; continue metoprolol 25 mg twice daily and patient is prophylaxed with Coumadin, and not on an aspirin; patient is not on statin  BPH SNF -continue Proscar 5 mg daily and Flomax 0.4 mg daily   Time spent greater than 45 minutes;> 50% of time with patient was spent reviewing records, labs, tests and studies, counseling and developing plan of care  Webb Silversmith D. Sheppard Coil, MD

## 2017-07-07 ENCOUNTER — Encounter: Payer: Self-pay | Admitting: Internal Medicine

## 2017-07-07 DIAGNOSIS — I2510 Atherosclerotic heart disease of native coronary artery without angina pectoris: Secondary | ICD-10-CM | POA: Insufficient documentation

## 2017-07-07 DIAGNOSIS — N390 Urinary tract infection, site not specified: Secondary | ICD-10-CM

## 2017-07-07 DIAGNOSIS — B9562 Methicillin resistant Staphylococcus aureus infection as the cause of diseases classified elsewhere: Secondary | ICD-10-CM | POA: Insufficient documentation

## 2017-07-07 DIAGNOSIS — R7881 Bacteremia: Secondary | ICD-10-CM | POA: Insufficient documentation

## 2017-07-07 DIAGNOSIS — N401 Enlarged prostate with lower urinary tract symptoms: Secondary | ICD-10-CM

## 2017-07-07 DIAGNOSIS — A4152 Sepsis due to Pseudomonas: Secondary | ICD-10-CM | POA: Insufficient documentation

## 2017-07-07 DIAGNOSIS — N138 Other obstructive and reflux uropathy: Secondary | ICD-10-CM | POA: Insufficient documentation

## 2017-07-07 DIAGNOSIS — B965 Pseudomonas (aeruginosa) (mallei) (pseudomallei) as the cause of diseases classified elsewhere: Secondary | ICD-10-CM | POA: Insufficient documentation

## 2017-07-17 ENCOUNTER — Non-Acute Institutional Stay (SKILLED_NURSING_FACILITY): Payer: Medicare Other | Admitting: Internal Medicine

## 2017-07-17 DIAGNOSIS — B965 Pseudomonas (aeruginosa) (mallei) (pseudomallei) as the cause of diseases classified elsewhere: Secondary | ICD-10-CM

## 2017-07-17 DIAGNOSIS — I25118 Atherosclerotic heart disease of native coronary artery with other forms of angina pectoris: Secondary | ICD-10-CM

## 2017-07-17 DIAGNOSIS — B9562 Methicillin resistant Staphylococcus aureus infection as the cause of diseases classified elsewhere: Secondary | ICD-10-CM

## 2017-07-17 DIAGNOSIS — R55 Syncope and collapse: Secondary | ICD-10-CM

## 2017-07-17 DIAGNOSIS — A4152 Sepsis due to Pseudomonas: Secondary | ICD-10-CM | POA: Diagnosis not present

## 2017-07-17 DIAGNOSIS — Z9079 Acquired absence of other genital organ(s): Secondary | ICD-10-CM | POA: Diagnosis not present

## 2017-07-17 DIAGNOSIS — N390 Urinary tract infection, site not specified: Secondary | ICD-10-CM

## 2017-07-17 DIAGNOSIS — N138 Other obstructive and reflux uropathy: Secondary | ICD-10-CM | POA: Diagnosis not present

## 2017-07-17 DIAGNOSIS — N401 Enlarged prostate with lower urinary tract symptoms: Secondary | ICD-10-CM

## 2017-07-17 DIAGNOSIS — I4891 Unspecified atrial fibrillation: Secondary | ICD-10-CM | POA: Diagnosis not present

## 2017-07-17 DIAGNOSIS — R7881 Bacteremia: Secondary | ICD-10-CM

## 2017-07-17 NOTE — Progress Notes (Signed)
Location:  [place] #: [place] of Service:   SNF Provider: Noah Delaine. Sheppard Coil, MD  PCP: Stephen Copeland, No Pcp Per Stephen Copeland Care Team: Stephen Copeland, No Pcp Per as PCP - General ([place]) Burnell Blanks, MD as PCP - Cardiology (Cardiology)  Extended Emergency Contact Information Primary Emergency Contact: Albertha Ghee [place] of [place] Phone: [phone] Mobile Phone: [phone] Relation: Spouse  No Known Allergies  Chief Complaint  Stephen Copeland presents with  . Discharge Note    HPI:  77 y.o. male with congestive heart failure, coronary artery disease, hypertension, paroxysmal atrial fibrillation, and chronic disc kidney disease who was a direct admit to know [facility] hospital after Stephen Copeland had a vasovagal response status post TURP at [facility] on 06/24/2017.  Stephen Copeland underwent a TURP followed by take back to OR for bleeding.  He was then admitted to the surgical floor for routine postop care postop course was uneventful until removal of his Foley postop day 2.  Throughout the course of the day Stephen Copeland was having difficulty urinating and retaining urine but refused catheter placement.  During the night he spiked a fever of 103.1 and went into atrial fibrillation RVR.  His heart rate responded to a dose of metoprolol and blood cultures were drawn and cardiac enzymes were obtained as well.  In the morning Stephen Copeland was having a bowel movement and voiding in the commode when he had a vasovagal episode and a CODE BLUE was called.  Stephen Copeland was placed in Trendelenburg and placed on oxygen is in mental status slowly returned.  A Foley catheter was placed at that time.  ECG was worrisome for left bundle branch block which was not new.  Stephen Copeland was then transferred to [facility] for higher level of care.  Stephen Copeland was admitted to [facility] from 4/23- 5/2.  Stephen Copeland was in atrial fib  which returned to normal sinus rhythm.  Blood cultures were positive for MRSA bacteremia as well as Pseudomonas bacteremia.  Infectious disease consult was obtained and they adjusted Stephen Copeland's IV antibiotic appropriately.  TEE was performed, blood cultures were negative.  Cardiology followed and eventually signed off with no further recommendations.  Arrangements are made for Stephen Copeland to quit IV antibiotics as outpatient infectious disease is recommending a date for cefepime to be 5/11 and on vancomycin until 5/24.  Stephen Copeland was admitted to skilled nursing facility for OT/PT and IV antibiotics and is now ready to be discharged to home.    Past Medical History:  Diagnosis Date  . Acid reflux   . CHF (congestive heart failure) ([place])   . Coronary artery disease   . Folliculitis   . Gastric ulcer   . Hiatal hernia   . Hyperlipidemia    a. pt is adamantly against statins.  . Hypertension   . Ischemia    a. Pt states he was diagnosed with "ischemia" in the 1990s but does not know further details, denies hx of heart blockage.  . Nummular dermatitis   . Renal disorder   . Scoliosis   . Sinus bradycardia   . Stomach ulcer    a. remote ulcer in the 1990s, no hx of bleeding.    Past Surgical History:  Procedure Laterality Date  . INGUINAL HERNIA REPAIR Left      reports that he has never smoked. He has never used smokeless tobacco. He reports that he does not drink alcohol or use drugs. Social  History   Socioeconomic History  . Marital status: Married    Spouse name: Not on file  . Number of children: Not on file  . Years of education: Not on file  . Highest education level: Not on file  Occupational History  . Occupation: retired    Fish farm manager: [employer]  Social Needs  . Financial resource strain: Not on file  . Food insecurity:    Worry: Not on file    Inability: Not on file  . Transportation needs:    Medical: Not on file    Non-medical: Not on file  Tobacco Use  . Smoking  status: Never Smoker  . Smokeless tobacco: Never Used  Substance and Sexual Activity  . Alcohol use: No  . Drug use: No  . Sexual activity: Not on file  Lifestyle  . Physical activity:    Days per week: Not on file    Minutes per session: Not on file  . Stress: Not on file  Relationships  . Social connections:    Talks on phone: Not on file    Gets together: Not on file    Attends religious service: Not on file    Active member of club or organization: Not on file    Attends meetings of clubs or organizations: Not on file    Relationship status: Not on file  . Intimate partner violence:    Fear of current or ex partner: Not on file    Emotionally abused: Not on file    Physically abused: Not on file    Forced sexual activity: Not on file  Other Topics Concern  . Not on file  Social History Narrative  . Not on file    Pertinent  Health Maintenance Due  Topic Date Due  . PNA vac Low Risk Adult (1 of 2 - PCV13) 01/24/2006  . INFLUENZA VACCINE  10/03/2017    Medications: Allergies as of 07/17/2017   No Known Allergies     Medication List        Accurate as of 07/17/17 11:59 PM. Always use your most recent med list.          ferrous sulfate 325 (65 FE) MG tablet Take 325 mg by mouth 2 (two) times daily with a meal.   hydrALAZINE 25 MG tablet Commonly known as:  APRESOLINE Take 1 tablet (25 mg total) by mouth every 8 (eight) hours.   hydrocortisone cream 1 % Apply 1 application topically 2 (two) times daily.   lidocaine 5 % ointment Commonly known as:  XYLOCAINE Apply 1 application topically as needed.   metoprolol tartrate 25 MG tablet Commonly known as:  LOPRESSOR Take 25 mg by mouth 2 (two) times daily.   nitroGLYCERIN 0.4 MG SL tablet Commonly known as:  NITROSTAT Place under the tongue every 5 (five) minutes as needed for chest pain.   omeprazole 20 MG capsule Commonly known as:  PRILOSEC Take 20 mg by mouth daily.   PROSCAR 5 MG tablet Generic  drug:  finasteride Take 5 mg by mouth daily.   tamsulosin 0.4 MG Caps capsule Commonly known as:  FLOMAX Take 0.4 mg by mouth daily after supper.   warfarin 1 MG tablet Commonly known as:  COUMADIN Take as directed by the anticoagulation clinic. If you are unsure how to take this medication, talk to your nurse or doctor. Original instructions:  Take 0.5 mg by mouth daily. Take along with 5mg  tablet for total of 5.5mg  QD  warfarin 5 MG tablet Commonly known as:  COUMADIN Take as directed by the anticoagulation clinic. If you are unsure how to take this medication, talk to your nurse or doctor. Original instructions:  TAKE 1 TABLET DAILY OR AS DIRECTED BY COUMADIN CLINIC (ELIQUIS WILL BE STOPPED)        Vitals:   07/17/17 1354  BP: (!) 142/74  Pulse: 65  Resp: 17  Temp: 98 F (36.7 C)  Weight: 157 lb 3.2 oz (71.3 kg)  Height: 6' 2.02" (1.88 m)   Body mass index is 20.17 kg/m.  Physical Exam  GENERAL APPEARANCE: Alert, conversant. No acute distress.  HEENT: Unremarkable. RESPIRATORY: Breathing is even, unlabored. Lung sounds are clear   CARDIOVASCULAR: Heart RRR no murmurs, rubs or gallops. No peripheral edema.  GASTROINTESTINAL: Abdomen is soft, non-tender, not distended w/ normal bowel sounds.  NEUROLOGIC: Cranial nerves 2-12 grossly intact. Moves all extremities   Labs reviewed: Basic Metabolic Panel: Recent Labs    12/20/16 0457 12/21/16 0433 12/22/16 0432 06/27/17 0429 06/27/17 0731  NA 145 144 145 142 141  K 3.8 3.3* 3.4* 4.3 3.2*  CL 108 107 113*  --   --   CO2 20* 24 21*  --   --   GLUCOSE 97 93 119*  --   --   BUN 96* 87* 77* 40* 44*  CREATININE 5.81* 4.55* 3.43* 2.2* 2.3*  CALCIUM 9.0 8.9 8.4*  --   --    No results found for: [facility] Liver Function Tests: Recent Labs    08/22/16 1049 12/18/16 1615 12/19/16 0432  AST 18 16 12*  ALT 20 14* 11*  ALKPHOS 146* 107 87  BILITOT 0.8 1.7* 1.5*  PROT 6.5 7.8 6.8  ALBUMIN 4.0 4.3 3.5   No  results for input(s): LIPASE, AMYLASE in the last 8760 hours. No results for input(s): AMMONIA in the last 8760 hours. CBC: Recent Labs    12/18/16 1615 12/19/16 0432 12/20/16 0457 06/27/17 0429 06/27/17 0847  WBC 19.9* 17.9* 9.4 11.3 20.5  HGB 13.5 11.1* 12.0* 9.2* 8.4*  HCT 39.5 33.3* 36.0* 29* 27*  MCV 93.6 92.8 93.3  --   --   PLT 223 189 203 177 174   Lipid No results for input(s): CHOL, HDL, LDLCALC, TRIG in the last 8760 hours. Cardiac Enzymes: Recent Labs    12/18/16 1615 12/19/16 0432  TROPONINI 0.07* 0.08*   BNP: No results for input(s): BNP in the last 8760 hours. CBG: No results for input(s): GLUCAP in the last 8760 hours.  Procedures and Imaging Studies During Stay: No results found.  Assessment/Plan:   Pseudomonas sepsis ([place])  MRSA bacteremia  Pseudomonas urinary tract infection  Vasovagal syncope  S/P TURP  Atrial fibrillation with RVR (HCC)  BPH with obstruction/lower urinary tract symptoms  Coronary artery disease of native artery of native heart with stable angina pectoris [facility])   Stephen Copeland is being discharged with the following home health services: PT/OT  Stephen Copeland is being discharged with the following durable medical equipment: None  Stephen Copeland has been advised to f/u with their PCP in 1-2 weeks to bring them up to date on their rehab stay.  Social services at facility was responsible for arranging this appointment.  Pt was provided with a 30 day supply of prescriptions for medications and refills must be obtained from their PCP.  For controlled substances, a more limited supply may be provided adequate until PCP appointment only.  Medications have been reconciled.  Time spent greater than  30 minutes;> 50% of time with Stephen Copeland was spent reviewing records, labs, tests and studies, counseling and developing plan of care  Noah Delaine. Sheppard Coil, MD

## 2017-07-18 ENCOUNTER — Encounter: Payer: Self-pay | Admitting: Internal Medicine

## 2017-07-18 DIAGNOSIS — R55 Syncope and collapse: Secondary | ICD-10-CM | POA: Insufficient documentation

## 2017-07-18 DIAGNOSIS — Z9079 Acquired absence of other genital organ(s): Secondary | ICD-10-CM | POA: Insufficient documentation

## 2017-07-25 DIAGNOSIS — N401 Enlarged prostate with lower urinary tract symptoms: Secondary | ICD-10-CM | POA: Diagnosis not present

## 2017-07-25 DIAGNOSIS — I480 Paroxysmal atrial fibrillation: Secondary | ICD-10-CM | POA: Diagnosis not present

## 2017-07-25 DIAGNOSIS — I10 Essential (primary) hypertension: Secondary | ICD-10-CM | POA: Diagnosis not present

## 2017-07-25 DIAGNOSIS — R7881 Bacteremia: Secondary | ICD-10-CM | POA: Diagnosis not present

## 2017-07-25 DIAGNOSIS — N183 Chronic kidney disease, stage 3 (moderate): Secondary | ICD-10-CM | POA: Diagnosis not present

## 2017-07-25 DIAGNOSIS — N138 Other obstructive and reflux uropathy: Secondary | ICD-10-CM | POA: Diagnosis not present

## 2017-07-29 ENCOUNTER — Encounter: Payer: Self-pay | Admitting: Internal Medicine

## 2017-08-01 ENCOUNTER — Ambulatory Visit (INDEPENDENT_AMBULATORY_CARE_PROVIDER_SITE_OTHER): Payer: Medicare Other | Admitting: *Deleted

## 2017-08-01 DIAGNOSIS — Z5181 Encounter for therapeutic drug level monitoring: Secondary | ICD-10-CM

## 2017-08-01 DIAGNOSIS — I480 Paroxysmal atrial fibrillation: Secondary | ICD-10-CM

## 2017-08-01 LAB — POCT INR: INR: 1.2 — AB (ref 2.0–3.0)

## 2017-08-01 NOTE — Patient Instructions (Signed)
Description   Today take 1.5 tablets then start taking 1 tablet daily except 1.5 tablets on Monday, Wednesday, and Friday.   Recheck INR in 1 week.  Main [phone] Coumadin Clinic [phone]

## 2017-08-08 ENCOUNTER — Ambulatory Visit (INDEPENDENT_AMBULATORY_CARE_PROVIDER_SITE_OTHER): Payer: Medicare Other

## 2017-08-08 DIAGNOSIS — I480 Paroxysmal atrial fibrillation: Secondary | ICD-10-CM | POA: Diagnosis not present

## 2017-08-08 DIAGNOSIS — Z5181 Encounter for therapeutic drug level monitoring: Secondary | ICD-10-CM

## 2017-08-08 LAB — POCT INR: INR: 2 (ref 2.0–3.0)

## 2017-08-08 NOTE — Patient Instructions (Signed)
Description   Continue on same dosage 1 tablet daily except 1.5 tablets on Mondays, Wednesdays, and Fridays.   Recheck INR in 1 week.  Main [phone] Coumadin Clinic [phone]

## 2017-08-29 ENCOUNTER — Ambulatory Visit (INDEPENDENT_AMBULATORY_CARE_PROVIDER_SITE_OTHER): Payer: Medicare Other | Admitting: Pharmacist

## 2017-08-29 DIAGNOSIS — Z5181 Encounter for therapeutic drug level monitoring: Secondary | ICD-10-CM | POA: Diagnosis not present

## 2017-08-29 DIAGNOSIS — I480 Paroxysmal atrial fibrillation: Secondary | ICD-10-CM | POA: Diagnosis not present

## 2017-08-29 LAB — POCT INR: INR: 1.3 — AB (ref 2.0–3.0)

## 2017-08-29 MED ORDER — WARFARIN SODIUM 5 MG PO TABS: ORAL_TABLET | ORAL | 0 refills | Status: DC

## 2017-08-29 NOTE — Patient Instructions (Signed)
Description   Take 1.5 tablets today and 2 tablets tomorrow, then start taking 1.5 tablets daily except 1 tablet on Tuesdays, Thursdays, and Saturdays.  Recheck INR in 1 week.  Main [phone] Coumadin Clinic [phone]

## 2017-09-06 ENCOUNTER — Ambulatory Visit (INDEPENDENT_AMBULATORY_CARE_PROVIDER_SITE_OTHER): Payer: Medicare Other | Admitting: Pharmacist

## 2017-09-06 DIAGNOSIS — I480 Paroxysmal atrial fibrillation: Secondary | ICD-10-CM | POA: Diagnosis not present

## 2017-09-06 DIAGNOSIS — Z5181 Encounter for therapeutic drug level monitoring: Secondary | ICD-10-CM

## 2017-09-06 LAB — POCT INR: INR: 1.8 — AB (ref 2.0–3.0)

## 2017-09-06 NOTE — Patient Instructions (Signed)
Description   Take 2 tablets today, then continue 1.5 tablets daily except 1 tablet on Tuesdays, Thursdays, and Saturdays.  Recheck INR in 1 week.  Main [phone] Coumadin Clinic [phone]

## 2017-09-10 DIAGNOSIS — C61 Malignant neoplasm of prostate: Secondary | ICD-10-CM | POA: Diagnosis not present

## 2017-09-10 DIAGNOSIS — N401 Enlarged prostate with lower urinary tract symptoms: Secondary | ICD-10-CM | POA: Diagnosis not present

## 2017-09-10 DIAGNOSIS — N138 Other obstructive and reflux uropathy: Secondary | ICD-10-CM | POA: Diagnosis not present

## 2017-09-13 ENCOUNTER — Ambulatory Visit (INDEPENDENT_AMBULATORY_CARE_PROVIDER_SITE_OTHER): Payer: Medicare Other | Admitting: *Deleted

## 2017-09-13 DIAGNOSIS — Z5181 Encounter for therapeutic drug level monitoring: Secondary | ICD-10-CM

## 2017-09-13 DIAGNOSIS — I480 Paroxysmal atrial fibrillation: Secondary | ICD-10-CM

## 2017-09-13 DIAGNOSIS — I4891 Unspecified atrial fibrillation: Secondary | ICD-10-CM

## 2017-09-13 LAB — POCT INR: INR: 1.8 — AB (ref 2.0–3.0)

## 2017-09-13 NOTE — Patient Instructions (Signed)
Description   Take 2 tablets today July 12th , then continue 1.5 tablets daily except 1 tablet on Tuesdays, Thursdays, and Saturdays.  Recheck INR in 1 week.  Main [phone] Coumadin Clinic [phone]

## 2017-09-20 ENCOUNTER — Ambulatory Visit (INDEPENDENT_AMBULATORY_CARE_PROVIDER_SITE_OTHER): Payer: Medicare Other | Admitting: *Deleted

## 2017-09-20 DIAGNOSIS — Z5181 Encounter for therapeutic drug level monitoring: Secondary | ICD-10-CM

## 2017-09-20 DIAGNOSIS — I480 Paroxysmal atrial fibrillation: Secondary | ICD-10-CM | POA: Diagnosis not present

## 2017-09-20 LAB — POCT INR: INR: 3.2 — AB (ref 2.0–3.0)

## 2017-09-20 NOTE — Patient Instructions (Signed)
Description   Skip Coumadin today, then continue 1.5 tablets daily except 1 tablet on Tuesdays, Thursdays, and Saturdays.  Recheck INR in 2 weeks.  Main [phone] Coumadin Clinic [phone]

## 2017-09-27 ENCOUNTER — Ambulatory Visit (INDEPENDENT_AMBULATORY_CARE_PROVIDER_SITE_OTHER): Payer: Medicare Other

## 2017-09-27 DIAGNOSIS — I480 Paroxysmal atrial fibrillation: Secondary | ICD-10-CM

## 2017-09-27 DIAGNOSIS — Z9889 Other specified postprocedural states: Secondary | ICD-10-CM | POA: Diagnosis not present

## 2017-09-27 DIAGNOSIS — C61 Malignant neoplasm of prostate: Secondary | ICD-10-CM | POA: Diagnosis not present

## 2017-09-27 DIAGNOSIS — Z5181 Encounter for therapeutic drug level monitoring: Secondary | ICD-10-CM | POA: Diagnosis not present

## 2017-09-27 DIAGNOSIS — R972 Elevated prostate specific antigen [PSA]: Secondary | ICD-10-CM | POA: Diagnosis not present

## 2017-09-27 DIAGNOSIS — N401 Enlarged prostate with lower urinary tract symptoms: Secondary | ICD-10-CM | POA: Diagnosis not present

## 2017-09-27 LAB — POCT INR: INR: 2.9 (ref 2.0–3.0)

## 2017-09-27 NOTE — Patient Instructions (Signed)
Description   Continue on same dosage 1.5 tablets daily except 1 tablet on Tuesdays, Thursdays, and Saturdays.  Recheck INR in 2 weeks.  Main [phone] Coumadin Clinic [phone]

## 2017-10-01 ENCOUNTER — Other Ambulatory Visit: Payer: Self-pay

## 2017-10-01 ENCOUNTER — Other Ambulatory Visit: Payer: Self-pay | Admitting: Pharmacist

## 2017-10-01 NOTE — Patient Outreach (Signed)
[facility]) Care Management  10/01/2017  Adrean Heitz 1940-06-12 [id number]   Referral Date: 10/01/17 Referral Source: MD referral Referral Reason: Assist with medication management   Outreach Attempt #1 No answer.  HIPAA compliant voice message left.   Plan: RN CM will attempt patient again within 4 business days.   [name], RN, MSN [facility] Care Management Care Management Coordinator Direct Line [phone] Toll Free: [phone]  Fax: [phone]

## 2017-10-01 NOTE — Progress Notes (Signed)
Referral to [facility] for help with medication management and filling pill box.

## 2017-10-02 ENCOUNTER — Other Ambulatory Visit: Payer: Self-pay

## 2017-10-02 NOTE — Patient Outreach (Signed)
[place] [facility]) Care Management  10/02/2017  Stephen Copeland 19-Mar-1940 [id number]   Referral Date: 10/01/17 Referral Source: MD referral Referral Reason: Assist with medication management   Outreach Attempt #2 No answer.  HIPAA compliant voice message left.   Plan: RN CM will send letter and attempt patient again within 4 business days.   [name], RN, MSN [facility] Care Management Care Management Coordinator Direct Line [phone] Toll Free: [phone]  Fax: [phone]

## 2017-10-03 ENCOUNTER — Ambulatory Visit: Payer: Self-pay

## 2017-10-04 ENCOUNTER — Other Ambulatory Visit: Payer: Self-pay

## 2017-10-04 NOTE — Patient Outreach (Signed)
[facility]) Care Management  10/04/2017  Stephen Copeland 06-Feb-1941 [id number]   Referral Date:10/01/17 Referral Source:MD referral Referral Reason:Assist with medication management   Outreach Attempt#3 No answer. HIPAA compliant voice message left.  Plan: RN CM will wait return call.  If no return call will close case.   [name], RN, MSN [place] Management Care Management Coordinator Direct Line [phone] Cell [phone] Toll Free: [phone]  Fax: [phone]

## 2017-10-10 ENCOUNTER — Ambulatory Visit (INDEPENDENT_AMBULATORY_CARE_PROVIDER_SITE_OTHER): Payer: Medicare Other | Admitting: Pharmacist

## 2017-10-10 DIAGNOSIS — I480 Paroxysmal atrial fibrillation: Secondary | ICD-10-CM

## 2017-10-10 DIAGNOSIS — Z5181 Encounter for therapeutic drug level monitoring: Secondary | ICD-10-CM | POA: Diagnosis not present

## 2017-10-10 LAB — POCT INR: INR: 3.3 — AB (ref 2.0–3.0)

## 2017-10-10 NOTE — Patient Instructions (Signed)
Description   No coumadin today then continue on same dosage 1.5 tablets daily except 1 tablet on Tuesdays, Thursdays, and Saturdays.  Recheck INR in 2 weeks.  Main [phone] Coumadin Clinic [phone]

## 2017-10-17 ENCOUNTER — Other Ambulatory Visit: Payer: Self-pay | Admitting: *Deleted

## 2017-10-17 ENCOUNTER — Other Ambulatory Visit: Payer: Self-pay

## 2017-10-17 NOTE — Patient Outreach (Addendum)
[place] [facility]) Care Management  10/17/2017  Alwyn Cordner 01-Mar-1941 [id number]   Multiple attempts to establish contact with patient without success. No response from letter mailed to patient.   Plan: RN CM will close case at this time and send referring provider an update on case.    [name], RN, MSN [place] Management Care Management Coordinator Direct Line [phone] Cell [phone] Toll Free: [phone]  Fax: [phone]

## 2017-10-17 NOTE — Patient Outreach (Signed)
Incoming call received from patient.  Patient had RNCM contact information from SNF visit while at [facility] earlier this year, previous RNCM Note is in [facility] acuity record.  Patient reports he was told by the coumadin clinic that he should have [facility] services to assist him with medication management.   RNCM discussed that a referral can be made for [facility] care management. Patient verbalized appreciation.    This RNCM noted in [place] record that patient had been outreached several times unsuccessfully.  Patient referral was from the MD office for medication management.  This RNCM reached out to Jon Billings, RN and discussed call from patient.  She will outreach again to patient. Verified the phone number that patient used when calling this RNCM as the one that Dionne used to outreach to patient.   As patient already referred and already had outreach, and this RNCM spoke to the assigned RNCM, will not place a new referral at this time.   Royetta Crochet. Laymond Purser, RN, BSN, [place] [phone]) Business Cell  [phone]) Toll Free Office

## 2017-10-18 ENCOUNTER — Other Ambulatory Visit: Payer: Self-pay

## 2017-10-18 NOTE — Patient Outreach (Signed)
[place] [facility]) Care Management  10/18/2017  Stephen Copeland March 29, 1940 [id number]   Telephone call to patient after conversation with Burgess Amor, RN about patient reaching out to her for medication management.  No answer.  HIPAA compliant voice message left for patient.  RN CM has already notified original referring provider concerning patient not being able to be reached.  Plan: RN CM will wait for patient to return phone call.  If no return call case will remain closed.    [name], RN, MSN [place] Management Care Management Coordinator Direct Line [phone] Cell [phone] Toll Free: [phone]  Fax: [phone]

## 2017-10-24 ENCOUNTER — Ambulatory Visit (INDEPENDENT_AMBULATORY_CARE_PROVIDER_SITE_OTHER): Payer: Medicare Other | Admitting: Cardiovascular Disease

## 2017-10-24 ENCOUNTER — Ambulatory Visit (INDEPENDENT_AMBULATORY_CARE_PROVIDER_SITE_OTHER): Payer: Medicare Other | Admitting: *Deleted

## 2017-10-24 ENCOUNTER — Encounter: Payer: Self-pay | Admitting: Cardiovascular Disease

## 2017-10-24 VITALS — BP 140/70 | HR 66 | Ht 74.0 in | Wt 162.1 lb

## 2017-10-24 DIAGNOSIS — I25118 Atherosclerotic heart disease of native coronary artery with other forms of angina pectoris: Secondary | ICD-10-CM | POA: Diagnosis not present

## 2017-10-24 DIAGNOSIS — I480 Paroxysmal atrial fibrillation: Secondary | ICD-10-CM

## 2017-10-24 DIAGNOSIS — I420 Dilated cardiomyopathy: Secondary | ICD-10-CM | POA: Diagnosis not present

## 2017-10-24 DIAGNOSIS — I5022 Chronic systolic (congestive) heart failure: Secondary | ICD-10-CM | POA: Diagnosis not present

## 2017-10-24 DIAGNOSIS — Z5181 Encounter for therapeutic drug level monitoring: Secondary | ICD-10-CM

## 2017-10-24 LAB — POCT INR: INR: 2.2 (ref 2.0–3.0)

## 2017-10-24 MED ORDER — OMEPRAZOLE 20 MG PO CPDR: 20.0000 mg | DELAYED_RELEASE_CAPSULE | Freq: Every day | ORAL | 3 refills | Status: DC

## 2017-10-24 NOTE — Patient Instructions (Signed)

## 2017-10-24 NOTE — Progress Notes (Signed)
Chief Complaint  Patient presents with  . Follow-up    cardiomyopathy   History of Present Illness: 77 yo male with history of HTN, HLD, sinus bradycardia, hiatal hernia, GERD, atrial fibrillation with LBBB who is here today for cardiac follow up. He was admitted to [facility] in October 2017 with rapid atrial fibrillation. He was started on amiodarone and Eliquis and converted to NSR. Echo with LVEF=40%. Outpatient stress test November 2017 was a high risk study with inferior and apical thinning with a perfusion defect in the mid anterior wall and apex. We had planned a cardiac cath but he c/o blackish green stools, cough and had no chest pain so cath was delayed.  He began having blood in his urine in February 2018 and was seen in Urology. Eliquis held for several days. He was started on Flomax. His hematuria resolved. He was seen in follow up and had no complaints. Lower extremity edema resolved with Lasix. He was admitted to [facility] March 2018 with UTI/sepsis/Pneumonia and had elevated LFTs. Amiodarone and Lipitor were stopped. Echo October 2018 with LVEF=30-35%, mild MR. He was admitted in January 2019 with renal failure with creatinine up to 8.6 in setting of urinary obstruction. He has been diagnosed with prostate cancer. He had a TURP on 06/24/17. He is now on coumadin.   He is here today for follow up. The patient denies any chest pain, dyspnea, palpitations, lower extremity edema, orthopnea, PND, dizziness, near syncope or syncope. He feels great. He is getting over his prostate issues. He has a nephrologist at [facility] in [facility] and is followed in Urology by Dr. Tommi Rumps in [facility].   Primary Care Physician: Clovia Cuff, MD [facility])  Past Medical History:  Diagnosis Date  . Acid reflux   . CHF (congestive heart failure) ([place])   . Coronary artery disease   . Dilated cardiomyopathy ([place])   . Folliculitis   . Gastric ulcer   . Hiatal hernia   . Hyperlipidemia    a. pt is adamantly against statins.  . Hypertension   . Ischemia    a. Pt states he was diagnosed with "ischemia" in the 1990s but does not know further details, denies hx of heart blockage.  . Nummular dermatitis   . Prostate cancer ([place])   . Renal disorder   . Scoliosis   . Sinus bradycardia   . Stomach ulcer    a. remote ulcer in the 1990s, no hx of bleeding.    Past Surgical History:  Procedure Laterality Date  . INGUINAL HERNIA REPAIR Left     Current Outpatient Medications  Medication Sig Dispense Refill  . ferrous sulfate 325 (65 FE) MG tablet Take 325 mg by mouth 2 (two) times daily with a meal.    . finasteride (PROSCAR) 5 MG tablet Take 5 mg by mouth daily.    . hydrALAZINE (APRESOLINE) 25 MG tablet Take 1 tablet (25 mg total) by mouth every 8 (eight) hours. 90 tablet 11  . hydrocortisone cream 1 % Apply 1 application topically 2 (two) times daily.    Marland Kitchen lidocaine (XYLOCAINE) 5 % ointment Apply 1 application topically as needed.    . metoprolol tartrate (LOPRESSOR) 25 MG tablet Take 25 mg by mouth 2 (two) times daily.     . nitroGLYCERIN (NITROSTAT) 0.4 MG SL tablet Place under the tongue every 5 (five) minutes as needed for chest pain.     Marland Kitchen omeprazole (PRILOSEC) 20 MG capsule Take 1 capsule (20  mg total) by mouth daily. 90 capsule 3  . tamsulosin (FLOMAX) 0.4 MG CAPS capsule Take 0.4 mg by mouth daily after supper.     . warfarin (COUMADIN) 5 MG tablet Take 1 to 1.5 tablets daily as directed by Coumadin clinic 120 tablet 0   No current facility-administered medications for this visit.     No Known Allergies  Social History   Socioeconomic History  . Marital status: Married    Spouse name: Not on file  . Number of children: Not on file  . Years of education: Not on file  . Highest education level: Not on file  Occupational History  . Occupation: retired    Fish farm manager: [employer]  Social Needs  . Financial resource strain: Not on file  . Food insecurity:     Worry: Not on file    Inability: Not on file  . Transportation needs:    Medical: Not on file    Non-medical: Not on file  Tobacco Use  . Smoking status: Never Smoker  . Smokeless tobacco: Never Used  Substance and Sexual Activity  . Alcohol use: No  . Drug use: No  . Sexual activity: Not on file  Lifestyle  . Physical activity:    Days per week: Not on file    Minutes per session: Not on file  . Stress: Not on file  Relationships  . Social connections:    Talks on phone: Not on file    Gets together: Not on file    Attends religious service: Not on file    Active member of club or organization: Not on file    Attends meetings of clubs or organizations: Not on file    Relationship status: Not on file  . Intimate partner violence:    Fear of current or ex partner: Not on file    Emotionally abused: Not on file    Physically abused: Not on file    Forced sexual activity: Not on file  Other Topics Concern  . Not on file  Social History Narrative  . Not on file    Family History  Problem Relation Age of Onset  . Heart disease Mother        Further details not reported, died at age 48  . Valvular heart disease Father        H/o MV surgery, diet at age 55    Review of Systems:  As stated in the HPI and otherwise negative.   BP 140/70   Pulse 66   Ht 6\' 2"  (1.88 m)   Wt 162 lb 1.9 oz (73.5 kg)   SpO2 94%   BMI 20.81 kg/m   Physical Examination:  General: Well developed, well nourished, NAD  HEENT: OP clear, mucus membranes moist  SKIN: warm, dry. No rashes. Neuro: No focal deficits  Musculoskeletal: Muscle strength 5/5 all ext  Psychiatric: Mood and affect normal  Neck: No JVD, no carotid bruits, no thyromegaly, no lymphadenopathy.  Lungs:Clear bilaterally, no wheezes, rhonci, crackles Cardiovascular: Regular rate and rhythm. No murmurs, gallops or rubs. Abdomen:Soft. Bowel sounds present. Non-tender.  Extremities: No lower extremity edema. Pulses are 2 + in  the bilateral DP/PT.  Echo 12/20/15: Left ventricle: The cavity size was normal. There was moderate   concentric hypertrophy. Systolic function was moderately reduced.   The estimated ejection fraction was 40%. Diffuse hypokinesis.   There is severe hypokinesis of the basal-midinferolateral,   inferior, and inferoseptal myocardium. - Ventricular septum: Septal motion  showed moderate paradox. These   changes are consistent with intraventricular conduction delay. - Aortic valve: Poorly visualized. Trileaflet; normal thickness,   mildly calcified leaflets. - Mitral valve: There was mild regurgitation. - Left atrium: The atrium was severely dilated. - Right atrium: The atrium was mildly dilated. - Pulmonic valve: There was mild regurgitation. - Pulmonary arteries: PA peak pressure: 35 mm Hg (S).  EKG:  EKG is ordered today. The ekg ordered today demonstrates sinus brady, rate 54 bpm. LBBB  Recent Labs: 12/19/2016: ALT 11 06/27/2017: BUN 44; Creatinine 2.3; Hemoglobin 8.4; Platelets 174; Potassium 3.2; Sodium 141   Lipid Panel    Component Value Date/Time   CHOL 130 12/20/2015 0325   TRIG 51 12/20/2015 0325   HDL 27 (L) 12/20/2015 0325   CHOLHDL 4.8 12/20/2015 0325   VLDL 10 12/20/2015 0325   LDLCALC 93 12/20/2015 0325     Wt Readings from Last 3 Encounters:  10/24/17 162 lb 1.9 oz (73.5 kg)  07/17/17 157 lb 3.2 oz (71.3 kg)  07/05/17 151 lb 0.2 oz (68.5 kg)     Other studies Reviewed: Additional studies/ records that were reviewed today include: . Review of the above records demonstrates:   Assessment and Plan:   1. Abnormal nuclear stress test: He had an abnormal stress test in 2017 but he was having no chest pain so cardiac cath was delayed at the request of the patient. LVEF 35% by most recent echo in October 2018. He has no chest pain. Will not plan an ischemic evaluation at this time unless he becomes symptomatic. He has many chronic issues but is feeling well overall.    2. Chronic systolic CHF/cardiomyopathy: LVEF=35% by echo October 2019. Weight is stable. Continue beta blocker. Will not start an ARB or Ace-inh due renal insufficiency. He does not use Lasix.   3. Atrial fib, paroxysmal: Sinus today. Amiodarone was stopped due to elevated LFTs in march 2018. Will continue metoprolol and coumadin.   4. GERD: Will refill Prilosec for him  He is asked to get a primary care doctor. He is establishing with [facility] and will call them back.   Current medicines are reviewed at length with the patient today.  The patient does not have concerns regarding medicines.  The following changes have been made:  no change  Labs/ tests ordered today include:   Orders Placed This Encounter  Procedures  . EKG 12-Lead   Disposition:   FU with me in 6 months  Signed, Lauree Chandler, MD 10/24/2017 3:23 PM    [facility] [place], [place], [place]  [address] Phone: [phone]; Fax: [phone]

## 2017-10-24 NOTE — Patient Instructions (Signed)
Description   Continue on same dosage 1.5 tablets daily except 1 tablet on Tuesdays, Thursdays, and Saturdays.  Recheck INR in 2 weeks.  Main [phone] Coumadin Clinic [phone]

## 2017-10-30 DIAGNOSIS — N138 Other obstructive and reflux uropathy: Secondary | ICD-10-CM | POA: Diagnosis not present

## 2017-10-30 DIAGNOSIS — I10 Essential (primary) hypertension: Secondary | ICD-10-CM | POA: Diagnosis not present

## 2017-10-30 DIAGNOSIS — C61 Malignant neoplasm of prostate: Secondary | ICD-10-CM | POA: Diagnosis not present

## 2017-10-30 DIAGNOSIS — N401 Enlarged prostate with lower urinary tract symptoms: Secondary | ICD-10-CM | POA: Diagnosis not present

## 2017-11-07 ENCOUNTER — Ambulatory Visit (INDEPENDENT_AMBULATORY_CARE_PROVIDER_SITE_OTHER): Payer: Medicare Other | Admitting: *Deleted

## 2017-11-07 DIAGNOSIS — Z5181 Encounter for therapeutic drug level monitoring: Secondary | ICD-10-CM

## 2017-11-07 DIAGNOSIS — I480 Paroxysmal atrial fibrillation: Secondary | ICD-10-CM

## 2017-11-07 LAB — POCT INR: INR: 2.9 (ref 2.0–3.0)

## 2017-11-07 NOTE — Patient Instructions (Signed)
Description   Continue on same dosage 1.5 tablets daily except 1 tablet on Tuesdays, Thursdays, and Saturdays.  Recheck INR in 2 weeks.  Main [phone] Coumadin Clinic [phone]

## 2017-11-21 ENCOUNTER — Other Ambulatory Visit: Payer: Self-pay | Admitting: *Deleted

## 2017-11-21 ENCOUNTER — Ambulatory Visit (INDEPENDENT_AMBULATORY_CARE_PROVIDER_SITE_OTHER): Payer: Medicare Other | Admitting: *Deleted

## 2017-11-21 DIAGNOSIS — Z5181 Encounter for therapeutic drug level monitoring: Secondary | ICD-10-CM | POA: Diagnosis not present

## 2017-11-21 DIAGNOSIS — I480 Paroxysmal atrial fibrillation: Secondary | ICD-10-CM

## 2017-11-21 LAB — POCT INR: INR: 1.2 — AB (ref 2.0–3.0)

## 2017-11-21 MED ORDER — METOPROLOL TARTRATE 25 MG PO TABS: 25.0000 mg | ORAL_TABLET | Freq: Two times a day (BID) | ORAL | 3 refills | Status: DC

## 2017-11-21 NOTE — Patient Instructions (Signed)
Description   Today take 1.5 tablets tomorrow take 2 tablets, then Continue on same dosage 1.5 tablets daily except 1 tablet on Tuesdays, Thursdays, and Saturdays.  Recheck INR in 1 week.  Main [phone] Coumadin Clinic [phone]

## 2017-11-28 ENCOUNTER — Ambulatory Visit (INDEPENDENT_AMBULATORY_CARE_PROVIDER_SITE_OTHER): Payer: Medicare Other | Admitting: *Deleted

## 2017-11-28 DIAGNOSIS — Z5181 Encounter for therapeutic drug level monitoring: Secondary | ICD-10-CM

## 2017-11-28 DIAGNOSIS — I480 Paroxysmal atrial fibrillation: Secondary | ICD-10-CM

## 2017-11-28 LAB — POCT INR: INR: 2.4 (ref 2.0–3.0)

## 2017-11-28 NOTE — Patient Instructions (Signed)
Description    Continue on same dosage 1.5 tablets daily except 1 tablet on Tuesdays, Thursdays, and Saturdays.  Recheck INR in 2 weeks.  Main [phone] Coumadin Clinic [phone]

## 2017-12-07 IMAGING — DX DG CHEST 2V
2 series · 2 of 2 positions shown · non-contrast
Comparison: 06/03/2016

CLINICAL DATA: Nonproductive cough

EXAM:
CHEST  2 VIEW

[chest pa]
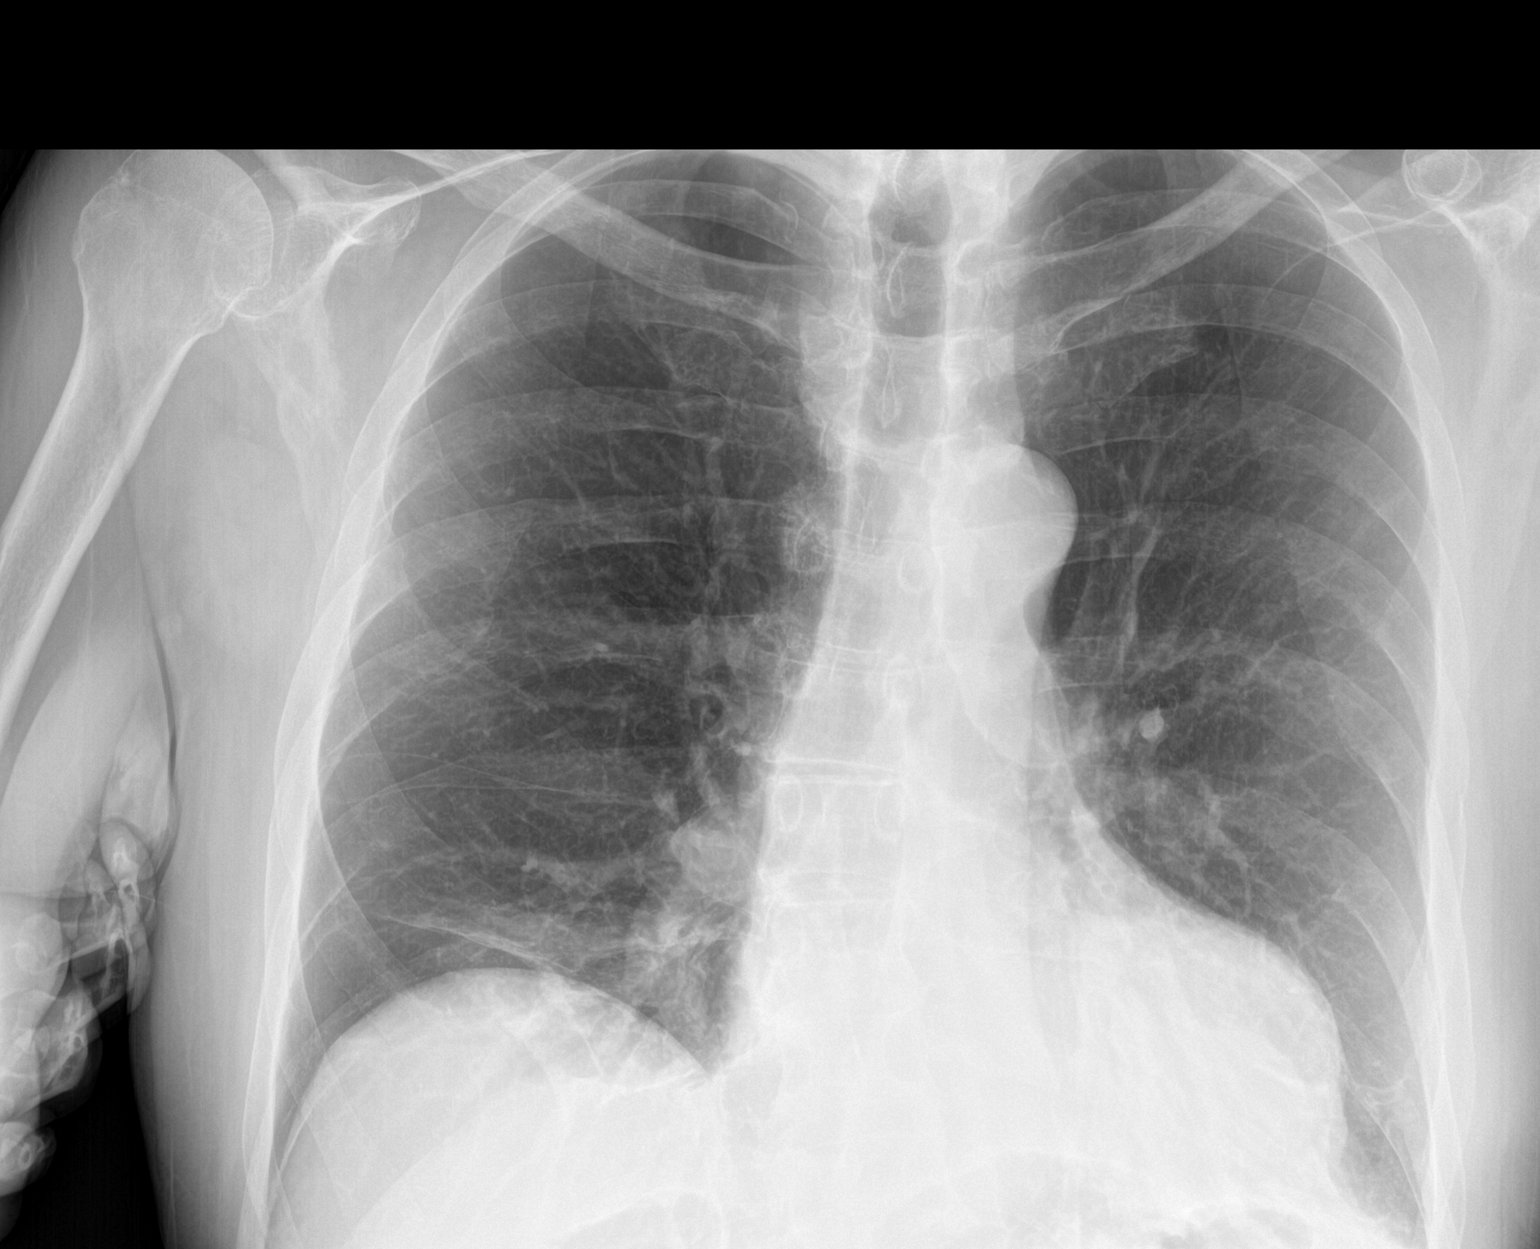

[chest lat]
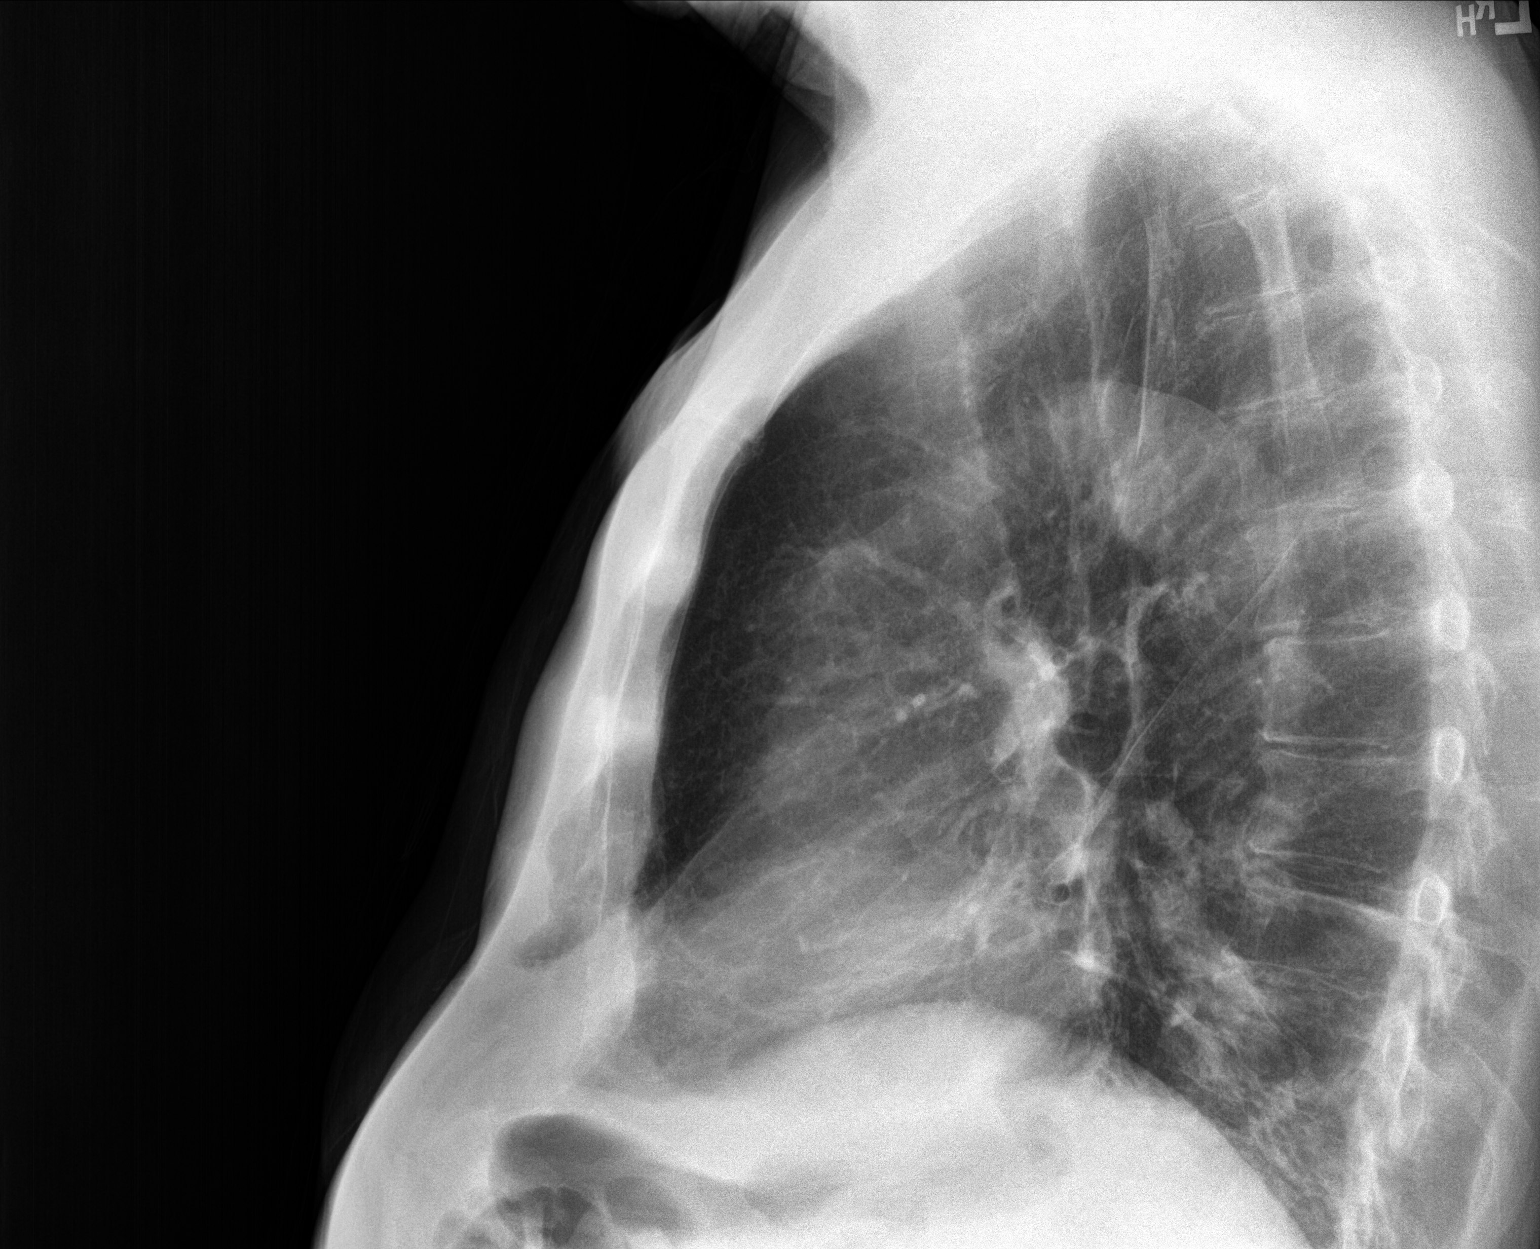

[2 of 2 positions shown; findings below may reference images not displayed]

FINDINGS: Chronic left ventricular prominence. Chronic aortic atherosclerosis.
As seen previously, there is volume loss/infiltrate in both lower
lobes consistent with pneumonia. Upper lungs are clear. No visible
effusion. Posterior costophrenic angles not included on the image.
Bony structures are normal.
IMPRESSION: Volume loss/infiltrate in both lower lobes consistent with
pneumonia, similar to the study of 9 days ago.

## 2017-12-12 ENCOUNTER — Ambulatory Visit (INDEPENDENT_AMBULATORY_CARE_PROVIDER_SITE_OTHER): Payer: Medicare Other | Admitting: Pharmacist

## 2017-12-12 DIAGNOSIS — Z5181 Encounter for therapeutic drug level monitoring: Secondary | ICD-10-CM | POA: Diagnosis not present

## 2017-12-12 DIAGNOSIS — I480 Paroxysmal atrial fibrillation: Secondary | ICD-10-CM | POA: Diagnosis not present

## 2017-12-12 LAB — POCT INR: INR: 2.7 (ref 2.0–3.0)

## 2017-12-12 NOTE — Patient Instructions (Signed)
Description    Continue on same dosage 1.5 tablets daily except 1 tablet on Tuesdays, Thursdays, and Saturdays.  Recheck INR in 3 weeks.  Main [phone] Coumadin Clinic [phone]

## 2017-12-17 DIAGNOSIS — N138 Other obstructive and reflux uropathy: Secondary | ICD-10-CM | POA: Diagnosis not present

## 2017-12-17 DIAGNOSIS — N401 Enlarged prostate with lower urinary tract symptoms: Secondary | ICD-10-CM | POA: Diagnosis not present

## 2017-12-17 DIAGNOSIS — I10 Essential (primary) hypertension: Secondary | ICD-10-CM | POA: Diagnosis not present

## 2017-12-17 DIAGNOSIS — C61 Malignant neoplasm of prostate: Secondary | ICD-10-CM | POA: Diagnosis not present

## 2017-12-26 ENCOUNTER — Ambulatory Visit (INDEPENDENT_AMBULATORY_CARE_PROVIDER_SITE_OTHER): Payer: Medicare Other | Admitting: Pharmacist

## 2017-12-26 DIAGNOSIS — I480 Paroxysmal atrial fibrillation: Secondary | ICD-10-CM

## 2017-12-26 DIAGNOSIS — Z5181 Encounter for therapeutic drug level monitoring: Secondary | ICD-10-CM

## 2017-12-26 LAB — POCT INR: INR: 2 (ref 2.0–3.0)

## 2017-12-26 MED ORDER — WARFARIN SODIUM 5 MG PO TABS: ORAL_TABLET | ORAL | 0 refills | Status: DC

## 2017-12-26 NOTE — Patient Instructions (Signed)
Description   Continue on same dosage 1.5 tablets daily except 1 tablet on Tuesdays, Thursdays, and Saturdays.  Recheck INR in 2 weeks to fill pill box.  Main [phone] Coumadin Clinic [phone]

## 2017-12-27 ENCOUNTER — Telehealth: Payer: Self-pay | Admitting: Pharmacist

## 2017-12-27 NOTE — Telephone Encounter (Signed)
Spoke with patient while in office yesterday for INR check. He is willing to have [facility] come to home to help fill pill boxes and assess need. He has previously declined services, but is now willing to have someone come to the home. Have reentered referral for [facility] services.

## 2017-12-31 DIAGNOSIS — H01021 Squamous blepharitis right upper eyelid: Secondary | ICD-10-CM | POA: Diagnosis not present

## 2017-12-31 DIAGNOSIS — H40013 Open angle with borderline findings, low risk, bilateral: Secondary | ICD-10-CM | POA: Diagnosis not present

## 2017-12-31 DIAGNOSIS — H10413 Chronic giant papillary conjunctivitis, bilateral: Secondary | ICD-10-CM | POA: Diagnosis not present

## 2017-12-31 DIAGNOSIS — H01024 Squamous blepharitis left upper eyelid: Secondary | ICD-10-CM | POA: Diagnosis not present

## 2017-12-31 DIAGNOSIS — H01022 Squamous blepharitis right lower eyelid: Secondary | ICD-10-CM | POA: Diagnosis not present

## 2017-12-31 DIAGNOSIS — H25812 Combined forms of age-related cataract, left eye: Secondary | ICD-10-CM | POA: Diagnosis not present

## 2017-12-31 DIAGNOSIS — H25811 Combined forms of age-related cataract, right eye: Secondary | ICD-10-CM | POA: Diagnosis not present

## 2017-12-31 DIAGNOSIS — H01025 Squamous blepharitis left lower eyelid: Secondary | ICD-10-CM | POA: Diagnosis not present

## 2017-12-31 DIAGNOSIS — Z961 Presence of intraocular lens: Secondary | ICD-10-CM | POA: Diagnosis not present

## 2017-12-31 DIAGNOSIS — H26493 Other secondary cataract, bilateral: Secondary | ICD-10-CM | POA: Diagnosis not present

## 2018-01-06 ENCOUNTER — Other Ambulatory Visit: Payer: Self-pay

## 2018-01-06 MED ORDER — HYDRALAZINE HCL 25 MG PO TABS: 25.0000 mg | ORAL_TABLET | Freq: Three times a day (TID) | ORAL | 8 refills | Status: DC

## 2018-01-06 NOTE — Patient Outreach (Signed)
[place] [facility]) Care Management  01/06/2018  Stephen Copeland 02-24-1941 [id number]  TELEPHONE SCREENING Referral date: 12/27/17 Referral source: primary MD Referral reason: medication management Insurance: medicare  Telephone call to patient regarding referral. Unable to reach patient. HIPAA compliant voice message left with call back phone number.   PLAN: RNCM will attempt 2nd telephone call to patient within 4 business days. RNCM will send outreach letter.   Quinn Plowman RN,BSN, [place] Telephonic  [phone]

## 2018-01-07 ENCOUNTER — Other Ambulatory Visit: Payer: Self-pay | Admitting: Cardiovascular Disease

## 2018-01-07 MED ORDER — METOPROLOL TARTRATE 25 MG PO TABS: 25.0000 mg | ORAL_TABLET | Freq: Two times a day (BID) | ORAL | 2 refills | Status: DC

## 2018-01-07 NOTE — Telephone Encounter (Signed)
Pt's medication was sent to pt's pharmacy as requested. Confirmation received.  °

## 2018-01-07 NOTE — Telephone Encounter (Signed)
° ° °*  STAT* If patient is at the pharmacy, call can be transferred to refill team.   1. Which medications need to be refilled? (please list name of each medication and dose if known) metoprolol tartrate (LOPRESSOR) 25 MG tablet   2. Which pharmacy/location (including street and city if local pharmacy) is medication to be sent to? [facility] - [place], [place] - [address]  3. Do they need a 30 day or 90 day supply? 90  

## 2018-01-09 ENCOUNTER — Ambulatory Visit (INDEPENDENT_AMBULATORY_CARE_PROVIDER_SITE_OTHER): Payer: Medicare Other | Admitting: Pharmacist

## 2018-01-09 ENCOUNTER — Ambulatory Visit: Payer: Self-pay

## 2018-01-09 ENCOUNTER — Telehealth: Payer: Self-pay | Admitting: Pharmacist

## 2018-01-09 ENCOUNTER — Other Ambulatory Visit: Payer: Self-pay

## 2018-01-09 DIAGNOSIS — Z5181 Encounter for therapeutic drug level monitoring: Secondary | ICD-10-CM

## 2018-01-09 DIAGNOSIS — I480 Paroxysmal atrial fibrillation: Secondary | ICD-10-CM | POA: Diagnosis not present

## 2018-01-09 LAB — POCT INR: INR: 3.7 — AB (ref 2.0–3.0)

## 2018-01-09 NOTE — Patient Outreach (Signed)
[place] [facility]) Care Management  01/09/2018  Zaylan Kissoon Sep 04, 1940 [id number]  TELEPHONE SCREENING Referral date: 12/27/17 Referral source: primary MD Referral reason: medication management Insurance: medicare  Received telephone call from [place] with [facility] health medical group coumadin clinic. Georgina Peer states patient is in the clinic today and is available to talk with RNCM to discuss [facility] care management services.  Claiborne Billings states patients INR's have been a little erratic due to patient confusing his hydralazine and warfarin.  She states patient has been getting his pill boxes filled at the coumadin clinic.  Georgina Peer states patient will need assistance at home with medication pill box fills and medication management.  RNCM spoke with patient. HIPAA verified. Discussed and offered [facility] care management services to patient. Patient verbally agreed to follow up with [facility] care management pharmacist for medication management.  Contact phone number for [facility] care management given to patient along with [facility] name.  Patient was advised to expect call from [facility] pharmacist within 7 to 10 business days.  Patient verbalized understanding. Patient states he sees Dr. Julianne Handler, cardiologist and a urologist.   PLAN: RNCM will refer patient to [facility] pharmacist.   Quinn Plowman RN,BSN,CCM [facility] Telephonic  [phone]

## 2018-01-09 NOTE — Patient Instructions (Signed)
Description   Hold today's dose. Then continue on the same dosage 1.5 tablets daily except 1 tablet on Tuesdays, Thursdays, and Saturdays.  Recheck INR in 2 weeks to fill pill box.  Main [phone] Coumadin Clinic [phone]

## 2018-01-09 NOTE — Telephone Encounter (Signed)
Auburn Bilberry [phone] with [facility] left message in Coumadin clinic - she is calling pt today to offer services (needs meds filled at home) and will let us know once she has reached him.

## 2018-01-10 ENCOUNTER — Telehealth: Payer: Self-pay | Admitting: Pharmacist

## 2018-01-10 NOTE — Patient Outreach (Signed)
[facility]) Care Management  01/10/2018  Stephen Copeland [birth year] [id number]  Patient was called about his pill box. HIPAA identifiers were obtained. The referral stated the patient had been having difficulty with fluctuating INRs.  As such, the Coumadin Clinic has been filling his pill box every 2 weeks.  However, as the patient's INR has normalized, he will not be coming in to the clinic as often. They requested someone reach to to the patient about his pill box and to complete a medication review.  Patient said he is able to fill his pill box on his own. He declined Pill Packs/bubble packing but agreed to a home visit to inspect his pill packs and help create some strategies to make filling his box easier.   Plan: Conduct home visit on 01/15/2018  Elayne Guerin, PharmD, [place] Clinical Pharmacist [phone]

## 2018-01-15 ENCOUNTER — Other Ambulatory Visit: Payer: Self-pay | Admitting: Pharmacist

## 2018-01-15 NOTE — Patient Outreach (Addendum)
[place] [facility]) Care Management  [place]   01/15/2018  Stephen Copeland 03/30/1940 [id number]   Reason for referral: Medication Adherence   Referral source: Provider's office  Current insurance: Medicare/Tricare   PMHx: 77 year old male with multiple medical conditions including but not limited to: Acid reflux, CHF, A Fib, GERD, CAD, A Fib, Hypertension.  Home visit completed at the patient's home. HIPAA identifiers were obtained. His wife Delia was in the home during the visit.   Objective: Lab Results  Component Value Date   CREATININE 2.3 (A) 06/27/2017   CREATININE 2.2 (A) 06/27/2017   CREATININE 3.43 (H) 12/22/2016    Lab Results  Component Value Date   HGBA1C 5.7 (H) 05/31/2016    Lipid Panel     Component Value Date/Time   CHOL 130 12/20/2015 0325   TRIG 51 12/20/2015 0325   HDL 27 (L) 12/20/2015 0325   CHOLHDL 4.8 12/20/2015 0325   VLDL 10 12/20/2015 0325   LDLCALC 93 12/20/2015 0325    BP Readings from Last 3 Encounters:  10/24/17 140/70  07/17/17 (!) 142/74  07/05/17 138/75    No Known Allergies  Medications Reviewed Today    Reviewed by Elayne Guerin, [place] (Pharmacist) on 01/15/18 at 1118  Med List Status: <None>  Medication Order Taking? Sig Documenting Provider Last Dose Status Informant  hydrALAZINE (APRESOLINE) 25 MG tablet [id number] Yes Take 1 tablet (25 mg total) by mouth every 8 (eight) hours. Burnell Blanks, MD Taking Active   hydrocortisone cream 1 % [id number] Yes Apply 1 application topically 2 (two) times daily. [provider] Taking Active   lidocaine (XYLOCAINE) 5 % ointment [id number] Yes Apply 1 application topically as needed. [provider] Taking Active   metoprolol tartrate (LOPRESSOR) 25 MG tablet [id number] Yes Take 1 tablet (25 mg total) by mouth 2 (two) times daily. Burnell Blanks, MD Taking Active   nitroGLYCERIN (NITROSTAT) 0.4 MG SL tablet [id number] Yes Place under  the tongue every 5 (five) minutes as needed for chest pain.  [provider] Taking Active   omeprazole (PRILOSEC) 20 MG capsule [id number] Yes Take 1 capsule (20 mg total) by mouth daily. Burnell Blanks, MD Taking Active   tamsulosin [facility]) 0.4 MG CAPS capsule [id number] Yes Take 0.4 mg by mouth daily after supper.  [provider] Taking Active   warfarin (COUMADIN) 5 MG tablet [id number] Yes Take 1 to 1.5 tablets daily as directed by Coumadin clinic Burnell Blanks, MD Taking Active   Med List Note [facility] 12/19/15 1424): [facility]           Assessment:  Drugs sorted by system:  Cardiovascular: Hydralazine, Metoprolol, Nitroglycerin  Gastrointestinal: Omeprazole  Topical: Lidocaine, Hydrocortisone  Genitourinary: Tamsulosin   Medication Assistance Findings:  Extra Help:   []  Already receiving Full Extra Help  []  Already receiving Partial Extra Help  []  Eligible based on reported income and assets  [x]  Not Eligible based on reported income and assets  Patient receives [employer] and 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  -[facility] does not offer ongoing pill box filling.     Plan: Call patient back in 7-10 business days to see  if the medication list is helpful. Route note to Coumadin Clinic provider as patient does not have a PCP   Elayne Guerin, PharmD, [place] Clinical Pharmacist [phone]

## 2018-01-23 ENCOUNTER — Ambulatory Visit (INDEPENDENT_AMBULATORY_CARE_PROVIDER_SITE_OTHER): Payer: Medicare Other | Admitting: *Deleted

## 2018-01-23 DIAGNOSIS — Z5181 Encounter for therapeutic drug level monitoring: Secondary | ICD-10-CM | POA: Diagnosis not present

## 2018-01-23 DIAGNOSIS — I480 Paroxysmal atrial fibrillation: Secondary | ICD-10-CM

## 2018-01-23 LAB — POCT INR: INR: 3.6 — AB (ref 2.0–3.0)

## 2018-01-23 NOTE — Patient Instructions (Signed)
Description   Hold today's dose, then start taking1 tablet daily except 1.5 tablets on Mondays, Wednesdays and Fridays.  Recheck INR in 2 weeks to fill pill box.  Main [phone] Coumadin Clinic [phone]

## 2018-01-28 ENCOUNTER — Other Ambulatory Visit: Payer: Self-pay

## 2018-01-28 ENCOUNTER — Other Ambulatory Visit: Payer: Self-pay | Admitting: Pharmacist

## 2018-01-28 NOTE — Patient Outreach (Signed)
[facility]) Care Management  01/28/2018  Stephen Copeland May 24, 1940 [id number]   Patient was called to follow up on his pill box.  HIPAA identifiers were obtained. Patient was given a personalized list to help him fill his pill box during a home visit earlier in the month.  He was also educated about switching to pill/bubble packing but he declined and said he prefers to use his pill bottles.  Although his pill box was inspected during the home visit, patient said his pill box was incorrect when he arrived at his Coumadin Clinic appointment. Patient said he thought he filled it correctly after our visit. Per the Coumadin Clinic note, they will refill the patient's pill box at his next visit.  Plan: Close patient's case as the pharmacists and nurses at the [facility] will continue to fill the patient's pill box at his INR visits.  Patient has my contact information for future medication questions or concerns.  Elayne Guerin, PharmD, [facility] Clinical Pharmacist [phone]

## 2018-01-28 NOTE — Patient Outreach (Signed)
[facility]) Care Management  01/28/2018  Eliyas Suddreth 1940-12-28 [id number]  TELEPHONE SCREENING Referral date: 01/27/18 Referral source: self referral  Referral reason: discuss services.  Insurance: Medicare Attempt #1  Telephone call to patient regarding regarding self referral. Unable to reach patient. HIPAA compliant voice message left with call back phone number. Marland Kitchen   PLAN: RNCM will attempt 2nd telephone call to patient within 4 business days.  RNCm will send outreach letter to attempt contact.   Quinn Plowman RN,BSN,CCM [facility] Telephonic  [phone]

## 2018-02-03 ENCOUNTER — Other Ambulatory Visit: Payer: Self-pay

## 2018-02-03 ENCOUNTER — Ambulatory Visit: Payer: Self-pay

## 2018-02-03 NOTE — Patient Outreach (Signed)
[facility]) Care Management  02/03/2018  Chrishawn Boley December 17, 1940 [id number]   TELEPHONE SCREENING Referral date: 01/27/18 Referral source: self referral  Referral reason: discuss services.  Insurance: Medicare Attempt #2  Telephone call to patient regarding regarding self referral. Unable to reach patient. HIPAA compliant voice message left with call back phone number. Marland Kitchen   PLAN: RNCM will attempt 3rd telephone call to patient within 4 business days.     Quinn Plowman RN,BSN,CCM [facility] Telephonic  [phone]

## 2018-02-03 NOTE — Patient Outreach (Signed)
[facility]) Care Management  02/03/2018  Stephen Copeland 01-29-1941 [id number]  TELEPHONE SCREENING Referral date: 01/27/18 Referral source: self referral  Referral reason: discuss services.  Insurance: Medicare  Received call from patient. HIPAA verified. Patient states he does not need a nurse or any services coming out to his home. Patient states he will be followed by the coumadin clinic who will assist him with his medications and pill box.   RNCM confirmed that  [facility] pharmacist, Denyse Amass has closed out his case.  Patient denies any further needs at this time.  PLAN:  No further follow up needed by [facility] at this time.   Quinn Plowman RN,BSN,CCM [facility] Telephonic  [phone]

## 2018-02-06 ENCOUNTER — Ambulatory Visit (INDEPENDENT_AMBULATORY_CARE_PROVIDER_SITE_OTHER): Payer: Medicare Other | Admitting: *Deleted

## 2018-02-06 DIAGNOSIS — Z5181 Encounter for therapeutic drug level monitoring: Secondary | ICD-10-CM

## 2018-02-06 DIAGNOSIS — I480 Paroxysmal atrial fibrillation: Secondary | ICD-10-CM | POA: Diagnosis not present

## 2018-02-06 LAB — POCT INR: INR: 2.7 (ref 2.0–3.0)

## 2018-02-06 NOTE — Patient Instructions (Signed)
Description   Continue taking1 tablet daily except 1.5 tablets on Mondays, Wednesdays and Fridays.  Recheck INR in 2 weeks to fill pill box.  Main [phone] Coumadin Clinic [phone]

## 2018-02-07 ENCOUNTER — Ambulatory Visit: Payer: Self-pay

## 2018-02-07 ENCOUNTER — Telehealth: Payer: Self-pay | Admitting: *Deleted

## 2018-02-07 DIAGNOSIS — R55 Syncope and collapse: Secondary | ICD-10-CM

## 2018-02-07 DIAGNOSIS — I25118 Atherosclerotic heart disease of native coronary artery with other forms of angina pectoris: Secondary | ICD-10-CM

## 2018-02-07 DIAGNOSIS — N189 Chronic kidney disease, unspecified: Secondary | ICD-10-CM

## 2018-02-07 DIAGNOSIS — I5022 Chronic systolic (congestive) heart failure: Secondary | ICD-10-CM

## 2018-02-07 DIAGNOSIS — I5021 Acute systolic (congestive) heart failure: Secondary | ICD-10-CM

## 2018-02-07 DIAGNOSIS — N179 Acute kidney failure, unspecified: Secondary | ICD-10-CM

## 2018-02-07 DIAGNOSIS — I10 Essential (primary) hypertension: Secondary | ICD-10-CM

## 2018-02-07 DIAGNOSIS — I480 Paroxysmal atrial fibrillation: Secondary | ICD-10-CM

## 2018-02-07 NOTE — Telephone Encounter (Signed)
Left message to call office on Monday

## 2018-02-07 NOTE — Telephone Encounter (Signed)
-----   Message from Burnell Blanks, MD sent at 02/07/2018  8:42 AM EST ----- Regarding: RE: Pt wanting to switching from Coumadin to [place]. Tiffany spoke to [place]. He could change but only if his creatinine is less than 2.5. Last BMET in April 2019 with creatinine of 2.3. Can we have him come in for a BMET?  Gerald Stabs  ----- Message ----- From: Zenovia Jarred, RN Sent: 02/06/2018   4:18 PM EST To: Burnell Blanks, MD Subject: Pt wanting to switching from Coumadin to Eli#  Pt seen in CVRR today and was inquiring about switching from Coumadin to Eliquis. Thank you.

## 2018-02-10 NOTE — Telephone Encounter (Signed)
° °  Patient requesting call this morning from nurse.

## 2018-02-10 NOTE — Telephone Encounter (Signed)
Spoke with the pt and arranged needed BMET, per Dr [name], for pt is wanting to transition from Coumadin to Eliquis. Pt states he wants to have his BMET checked same day as he comes in for Coumadin Clinic on 02/20/18, for commute reasons. Pt states that he will also bring a log of his BP readings and drop them off to [facility] and Dr [name] for further review.  Pt having no symptoms, but has been getting free checks at his Pharmacy, and wants to make sure readings are acceptable from cardiac perspective and from Dr [name].  Endorsed to the pt that I will make Fraser Din RN aware of this. Pt verbalized understanding and agrees with this plan.

## 2018-02-20 ENCOUNTER — Other Ambulatory Visit: Payer: Medicare Other

## 2018-02-20 ENCOUNTER — Ambulatory Visit (INDEPENDENT_AMBULATORY_CARE_PROVIDER_SITE_OTHER): Payer: Medicare Other | Admitting: Pharmacist

## 2018-02-20 DIAGNOSIS — N189 Chronic kidney disease, unspecified: Secondary | ICD-10-CM

## 2018-02-20 DIAGNOSIS — I480 Paroxysmal atrial fibrillation: Secondary | ICD-10-CM | POA: Diagnosis not present

## 2018-02-20 DIAGNOSIS — I10 Essential (primary) hypertension: Secondary | ICD-10-CM

## 2018-02-20 DIAGNOSIS — I5022 Chronic systolic (congestive) heart failure: Secondary | ICD-10-CM

## 2018-02-20 DIAGNOSIS — I25118 Atherosclerotic heart disease of native coronary artery with other forms of angina pectoris: Secondary | ICD-10-CM

## 2018-02-20 DIAGNOSIS — Z5181 Encounter for therapeutic drug level monitoring: Secondary | ICD-10-CM

## 2018-02-20 DIAGNOSIS — N179 Acute kidney failure, unspecified: Secondary | ICD-10-CM | POA: Diagnosis not present

## 2018-02-20 DIAGNOSIS — I5021 Acute systolic (congestive) heart failure: Secondary | ICD-10-CM | POA: Diagnosis not present

## 2018-02-20 LAB — POCT INR: INR: 2.1 (ref 2.0–3.0)

## 2018-02-20 NOTE — Progress Notes (Signed)
Ok per Dr. [name] to switch to Eliquis. Patient did not want to switch until he uses up the rest of his warfarin supply

## 2018-02-20 NOTE — Patient Instructions (Signed)
Continue taking 1 tablet daily except 1.5 tablets on Mondays, Wednesdays and Fridays. Recheck INR in 2 weeks to fill pill box. Main [phone]. Coumadin Clinic [phone]

## 2018-02-21 ENCOUNTER — Telehealth: Payer: Self-pay | Admitting: *Deleted

## 2018-02-21 LAB — BASIC METABOLIC PANEL
BUN / CREAT RATIO: 19 (ref 10–24)
BUN: 34 mg/dL — ABNORMAL HIGH (ref 8–27)
CO2: 23 mmol/L (ref 20–29)
CREATININE: 1.83 mg/dL — AB (ref 0.76–1.27)
Calcium: 9.9 mg/dL (ref 8.6–10.2)
Chloride: 108 mmol/L — ABNORMAL HIGH (ref 96–106)
GFR, EST AFRICAN AMERICAN: 40 mL/min/{1.73_m2} — AB (ref >59)
GFR, EST NON AFRICAN AMERICAN: 35 mL/min/{1.73_m2} — AB (ref >59)
Glucose: 86 mg/dL (ref 65–99)
POTASSIUM: 4.4 mmol/L (ref 3.5–5.2)
SODIUM: 147 mmol/L — AB (ref 134–144)

## 2018-02-21 NOTE — Telephone Encounter (Signed)
-----   Message from Burnell Blanks, MD sent at 02/21/2018  6:08 AM EST ----- Creatinine is stable for switch from coumadin to Eliquis. cdm

## 2018-02-21 NOTE — Telephone Encounter (Signed)
Follow for another 2 weeks then call us back if still elevated. cdm

## 2018-02-21 NOTE — Telephone Encounter (Signed)
Left message to call office

## 2018-02-21 NOTE — Telephone Encounter (Signed)
Would recommend patient take an extra 1/2 tablet tonight to make up for missed dose last night. Then resume usual dose and keep follow up as scheduled.

## 2018-02-21 NOTE — Telephone Encounter (Signed)
I spoke with pt and reviewed lab results with him.  He is following up with coumadin clinic on January 7,2020 to transition to Eliquis. He reports he did not take coumadin last night because he fell asleep. I told him I would make coumadin clinic aware. He also did not take PM BP medicines last night. He is aware not to double up but to get back on regular schedule with these medications today. He checks BP when at pharmacy and reports the following readings- 12/5-152/94 12/10-160/86, heart rate 59 12/19- 143/89, heart rate 61. He does not have another way of checking BP. These readings are taken at random times during the day but after he has had his morning medications. Will forward to Dr. [name] for review/recommendations.

## 2018-02-21 NOTE — Telephone Encounter (Signed)
I spoke with pt and gave him information from pharmacist and Dr. [name]

## 2018-02-22 ENCOUNTER — Other Ambulatory Visit: Payer: Self-pay | Admitting: Cardiovascular Disease

## 2018-03-03 ENCOUNTER — Telehealth: Payer: Self-pay | Admitting: *Deleted

## 2018-03-03 NOTE — Telephone Encounter (Signed)
-----   Message from Burnell Blanks, MD sent at 02/07/2018  8:42 AM EST ----- Regarding: RE: Pt wanting to switching from Coumadin to [place]. Tiffany spoke to [place]. He could change but only if his creatinine is less than 2.5. Last BMET in April 2019 with creatinine of 2.3. Can we have him come in for a BMET?  Gerald Stabs  ----- Message ----- From: Zenovia Jarred, RN Sent: 02/06/2018   4:18 PM EST To: Burnell Blanks, MD Subject: Pt wanting to switching from Coumadin to Eli#  Pt seen in CVRR today and was inquiring about switching from Coumadin to Eliquis. Thank you.

## 2018-03-11 ENCOUNTER — Ambulatory Visit (INDEPENDENT_AMBULATORY_CARE_PROVIDER_SITE_OTHER): Payer: Medicare Other | Admitting: Pharmacist

## 2018-03-11 DIAGNOSIS — Z5181 Encounter for therapeutic drug level monitoring: Secondary | ICD-10-CM

## 2018-03-11 DIAGNOSIS — I480 Paroxysmal atrial fibrillation: Secondary | ICD-10-CM

## 2018-03-11 LAB — POCT INR: INR: 2.4 (ref 2.0–3.0)

## 2018-03-11 NOTE — Patient Instructions (Signed)
Continue taking 1 tablet daily except 1.5 tablets on Mondays, Wednesdays and Fridays. Recheck INR in 2 weeks to fill pill box. Main [phone]. Coumadin Clinic [phone]

## 2018-03-18 DIAGNOSIS — C61 Malignant neoplasm of prostate: Secondary | ICD-10-CM | POA: Diagnosis not present

## 2018-03-18 DIAGNOSIS — N138 Other obstructive and reflux uropathy: Secondary | ICD-10-CM | POA: Diagnosis not present

## 2018-03-18 DIAGNOSIS — N401 Enlarged prostate with lower urinary tract symptoms: Secondary | ICD-10-CM | POA: Diagnosis not present

## 2018-03-18 DIAGNOSIS — N185 Chronic kidney disease, stage 5: Secondary | ICD-10-CM | POA: Diagnosis not present

## 2018-03-25 ENCOUNTER — Ambulatory Visit (INDEPENDENT_AMBULATORY_CARE_PROVIDER_SITE_OTHER): Payer: Medicare Other | Admitting: *Deleted

## 2018-03-25 DIAGNOSIS — Z5181 Encounter for therapeutic drug level monitoring: Secondary | ICD-10-CM | POA: Diagnosis not present

## 2018-03-25 DIAGNOSIS — I480 Paroxysmal atrial fibrillation: Secondary | ICD-10-CM | POA: Diagnosis not present

## 2018-03-25 LAB — POCT INR: INR: 2.3 (ref 2.0–3.0)

## 2018-03-25 NOTE — Patient Instructions (Signed)
Description   Ok per Dr. [name] to switch to Eliquis-pt will wait until Warfarin/Coumadin runs out.  Continue taking 1 tablet daily except 1.5 tablets on Mondays, Wednesdays and Fridays. Recheck INR in 2 weeks to fill pill box. Main [phone]. Coumadin Clinic [phone]

## 2018-04-08 ENCOUNTER — Ambulatory Visit (INDEPENDENT_AMBULATORY_CARE_PROVIDER_SITE_OTHER): Payer: Medicare Other | Admitting: Pharmacist

## 2018-04-08 DIAGNOSIS — Z5181 Encounter for therapeutic drug level monitoring: Secondary | ICD-10-CM | POA: Diagnosis not present

## 2018-04-08 DIAGNOSIS — I480 Paroxysmal atrial fibrillation: Secondary | ICD-10-CM

## 2018-04-08 LAB — POCT INR: INR: 2.4 (ref 2.0–3.0)

## 2018-04-08 NOTE — Patient Instructions (Signed)
Continue taking 1 tablet daily except 1.5 tablets on Mondays, Wednesdays and Fridays. Recheck INR in 2 weeks to fill pill box. Main [phone]. Coumadin Clinic [phone]

## 2018-04-29 ENCOUNTER — Ambulatory Visit (INDEPENDENT_AMBULATORY_CARE_PROVIDER_SITE_OTHER): Payer: Medicare Other | Admitting: Pharmacist

## 2018-04-29 DIAGNOSIS — I480 Paroxysmal atrial fibrillation: Secondary | ICD-10-CM | POA: Diagnosis not present

## 2018-04-29 DIAGNOSIS — Z5181 Encounter for therapeutic drug level monitoring: Secondary | ICD-10-CM

## 2018-04-29 LAB — POCT INR: INR: 1.9 — AB (ref 2.0–3.0)

## 2018-04-29 MED ORDER — APIXABAN 5 MG PO TABS: 5.0000 mg | ORAL_TABLET | Freq: Two times a day (BID) | ORAL | 1 refills | Status: DC

## 2018-04-29 NOTE — Patient Instructions (Signed)
Description   STOP taking warfarin. START taking Eliquis 5mg  TWICE daily. Recheck labs in 2 months.

## 2018-04-30 ENCOUNTER — Ambulatory Visit (INDEPENDENT_AMBULATORY_CARE_PROVIDER_SITE_OTHER): Payer: Medicare Other | Admitting: Cardiovascular Disease

## 2018-04-30 ENCOUNTER — Encounter: Payer: Self-pay | Admitting: Cardiovascular Disease

## 2018-04-30 VITALS — BP 158/88 | HR 59 | Ht 74.0 in | Wt 188.0 lb

## 2018-04-30 DIAGNOSIS — E875 Hyperkalemia: Secondary | ICD-10-CM

## 2018-04-30 DIAGNOSIS — I10 Essential (primary) hypertension: Secondary | ICD-10-CM

## 2018-04-30 DIAGNOSIS — R9439 Abnormal result of other cardiovascular function study: Secondary | ICD-10-CM

## 2018-04-30 DIAGNOSIS — I5022 Chronic systolic (congestive) heart failure: Secondary | ICD-10-CM | POA: Diagnosis not present

## 2018-04-30 DIAGNOSIS — I480 Paroxysmal atrial fibrillation: Secondary | ICD-10-CM | POA: Diagnosis not present

## 2018-04-30 LAB — BASIC METABOLIC PANEL
BUN/Creatinine Ratio: 18 (ref 10–24)
BUN: 32 mg/dL — ABNORMAL HIGH (ref 8–27)
CHLORIDE: 106 mmol/L (ref 96–106)
CO2: 23 mmol/L (ref 20–29)
Calcium: 9.7 mg/dL (ref 8.6–10.2)
Creatinine, Ser: 1.82 mg/dL — ABNORMAL HIGH (ref 0.76–1.27)
GFR calc Af Amer: 41 mL/min/{1.73_m2} — ABNORMAL LOW (ref >59)
GFR calc non Af Amer: 35 mL/min/{1.73_m2} — ABNORMAL LOW (ref >59)
Glucose: 99 mg/dL (ref 65–99)
Potassium: 5.3 mmol/L — ABNORMAL HIGH (ref 3.5–5.2)
SODIUM: 142 mmol/L (ref 134–144)

## 2018-04-30 LAB — CBC
Hematocrit: 45.6 % (ref 37.5–51.0)
Hemoglobin: 14.7 g/dL (ref 13.0–17.7)
MCH: 30.5 pg (ref 26.6–33.0)
MCHC: 32.2 g/dL (ref 31.5–35.7)
MCV: 95 fL (ref 79–97)
PLATELETS: 281 10*3/uL (ref 150–450)
RBC: 4.82 x10E6/uL (ref 4.14–5.80)
RDW: 13.7 % (ref 11.6–15.4)
WBC: 7.3 10*3/uL (ref 3.4–10.8)

## 2018-04-30 MED ORDER — HYDRALAZINE HCL 50 MG PO TABS: 50.0000 mg | ORAL_TABLET | Freq: Three times a day (TID) | ORAL | 3 refills | Status: DC

## 2018-04-30 NOTE — Progress Notes (Signed)
Chief Complaint  Patient presents with  . Follow-up    HTN   History of Present Illness: 78 yo male with history of HTN, HLD, sinus bradycardia, hiatal hernia, GERD, atrial fibrillation and LBBB who is here today for cardiac follow up. He was admitted to [facility] in October 2017 with rapid atrial fibrillation. He was started on amiodarone and Eliquis and converted to NSR. Echo with LVEF=40%. Outpatient stress test November 2017 was a high risk study with inferior and apical thinning with a perfusion defect in the mid anterior wall and apex. We had planned a cardiac cath but he c/o blackish green stools, cough and had no chest pain so cath was delayed.  He began having blood in his urine in February 2018 and was seen in Urology. Eliquis held for several days. He was started on Flomax. His hematuria resolved. He was seen in follow up and had no complaints. Lower extremity edema resolved with Lasix. He was admitted to [facility] March 2018 with UTI/sepsis/Pneumonia and had elevated LFTs. Amiodarone and Lipitor were stopped. Echo October 2018 with LVEF=30-35%, mild MR. He was admitted in January 2019 with renal failure with creatinine up to 8.6 in setting of urinary obstruction. He has been diagnosed with prostate cancer. He had a TURP on 06/24/17. He has been on coumadin but changed to Eliquis yesterday during his visit in coumadin clinic.    He is here today for follow up. The patient denies any chest pain, dyspnea, palpitations, lower extremity edema, orthopnea, PND, dizziness, near syncope or syncope.   Primary Care Physician: Avelino Leeds, MD   Past Medical History:  Diagnosis Date  . Acid reflux   . CHF (congestive heart failure) ([place])   . Coronary artery disease   . Dilated cardiomyopathy ([place])   . Folliculitis   . Gastric ulcer   . Hiatal hernia   . Hyperlipidemia    a. pt is adamantly against statins.  . Hypertension   . Ischemia    a. Pt states he was diagnosed with "ischemia" in the 1990s  but does not know further details, denies hx of heart blockage.  . Nummular dermatitis   . Prostate cancer ([place])   . Renal disorder   . Scoliosis   . Sinus bradycardia   . Stomach ulcer    a. remote ulcer in the 1990s, no hx of bleeding.    Past Surgical History:  Procedure Laterality Date  . INGUINAL HERNIA REPAIR Left     Current Outpatient Medications  Medication Sig Dispense Refill  . apixaban (ELIQUIS) 5 MG TABS tablet Take 1 tablet (5 mg total) by mouth 2 (two) times daily. 180 tablet 1  . hydrocortisone cream 1 % Apply 1 application topically 2 (two) times daily.    Marland Kitchen lidocaine (XYLOCAINE) 5 % ointment Apply 1 application topically as needed.    . metoprolol tartrate (LOPRESSOR) 25 MG tablet Take 1 tablet (25 mg total) by mouth 2 (two) times daily. 180 tablet 2  . nitroGLYCERIN (NITROSTAT) 0.4 MG SL tablet Place under the tongue every 5 (five) minutes as needed for chest pain.     Marland Kitchen omeprazole (PRILOSEC) 20 MG capsule Take 1 capsule (20 mg total) by mouth daily. 90 capsule 3  . tamsulosin (FLOMAX) 0.4 MG CAPS capsule Take 0.4 mg by mouth daily after supper.     . hydrALAZINE (APRESOLINE) 50 MG tablet Take 1 tablet (50 mg total) by mouth 3 (three) times daily. 270 tablet 3   No current  facility-administered medications for this visit.     No Known Allergies  Social History   Socioeconomic History  . Marital status: Married    Spouse name: Not on file  . Number of children: Not on file  . Years of education: Not on file  . Highest education level: Not on file  Occupational History  . Occupation: retired    Fish farm manager: [employer]  Social Needs  . Financial resource strain: Not on file  . Food insecurity:    Worry: Not on file    Inability: Not on file  . Transportation needs:    Medical: Not on file    Non-medical: Not on file  Tobacco Use  . Smoking status: Never Smoker  . Smokeless tobacco: Never Used  Substance and Sexual Activity  . Alcohol use: No  . Drug  use: No  . Sexual activity: Not on file  Lifestyle  . Physical activity:    Days per week: Not on file    Minutes per session: Not on file  . Stress: Not on file  Relationships  . Social connections:    Talks on phone: Not on file    Gets together: Not on file    Attends religious service: Not on file    Active member of club or organization: Not on file    Attends meetings of clubs or organizations: Not on file    Relationship status: Not on file  . Intimate partner violence:    Fear of current or ex partner: Not on file    Emotionally abused: Not on file    Physically abused: Not on file    Forced sexual activity: Not on file  Other Topics Concern  . Not on file  Social History Narrative  . Not on file    Family History  Problem Relation Age of Onset  . Heart disease Mother        Further details not reported, died at age 57  . Valvular heart disease Father        H/o MV surgery, diet at age 19    Review of Systems:  As stated in the HPI and otherwise negative.   BP (!) 158/88   Pulse (!) 59   Ht 6\' 2"  (1.88 m)   Wt 85.3 kg   SpO2 93%   BMI 24.14 kg/m   Physical Examination:  General: Well developed, well nourished, NAD  HEENT: OP clear, mucus membranes moist  SKIN: warm, dry. No rashes. Neuro: No focal deficits  Musculoskeletal: Muscle strength 5/5 all ext  Psychiatric: Mood and affect normal  Neck: No JVD, no carotid bruits, no thyromegaly, no lymphadenopathy.  Lungs:Clear bilaterally, no wheezes, rhonci, crackles Cardiovascular: Regular rate and rhythm. No murmurs, gallops or rubs. Abdomen:Soft. Bowel sounds present. Non-tender.  Extremities: No lower extremity edema. Pulses are 2 + in the bilateral DP/PT.  Echo 12/20/15: Left ventricle: The cavity size was normal. There was moderate   concentric hypertrophy. Systolic function was moderately reduced.   The estimated ejection fraction was 40%. Diffuse hypokinesis.   There is severe hypokinesis of the  basal-midinferolateral,   inferior, and inferoseptal myocardium. - Ventricular septum: Septal motion showed moderate paradox. These   changes are consistent with intraventricular conduction delay. - Aortic valve: Poorly visualized. Trileaflet; normal thickness,   mildly calcified leaflets. - Mitral valve: There was mild regurgitation. - Left atrium: The atrium was severely dilated. - Right atrium: The atrium was mildly dilated. - Pulmonic valve: There was  mild regurgitation. - Pulmonary arteries: PA peak pressure: 35 mm Hg (S).  EKG:  EKG is not ordered today. The ekg ordered today demonstrates   Recent Labs: 04/29/2018: BUN 32; Creatinine, Ser 1.82; Hemoglobin 14.7; Platelets 281; Potassium 5.3; Sodium 142   Lipid Panel    Component Value Date/Time   CHOL 130 12/20/2015 0325   TRIG 51 12/20/2015 0325   HDL 27 (L) 12/20/2015 0325   CHOLHDL 4.8 12/20/2015 0325   VLDL 10 12/20/2015 0325   LDLCALC 93 12/20/2015 0325     Wt Readings from Last 3 Encounters:  04/30/18 85.3 kg  10/24/17 73.5 kg  07/17/17 71.3 kg     Other studies Reviewed: Additional studies/ records that were reviewed today include: . Review of the above records demonstrates:   Assessment and Plan:   1. Abnormal nuclear stress test: He had an abnormal stress test in 2017 but he was having no chest pain so cardiac cath was delayed at the request of the patient. LVEF 35% by most recent echo in October 2018. He has had no chest pain or dyspnea. No plans for an ischemic evaluation unless he becomes symptomatic. He has many chronic issues. he has no chest pain.    2. Chronic systolic CHF/cardiomyopathy: LVEF=35% by echo October 2019. Volume status ok today. Will continue beta blocker. No ARB or Ace-inh due to renal insufficiency.    3. Atrial fib, paroxysmal: He is in sinus. Continue metoprolol and Eliquis.   4. HTN: BP is elevated today. Will increase Hydralazine to 50 mg po TID.    5. Hyperkalemia: Potassium  high yesterday. Will repeat potassium level today.   Current medicines are reviewed at length with the patient today.  The patient does not have concerns regarding medicines.  The following changes have been made:  no change  Labs/ tests ordered today include:   Orders Placed This Encounter  Procedures  . Potassium   Disposition:   FU with me in 12 months  Signed, Lauree Chandler, MD 04/30/2018 3:27 PM    [facility] [address], [place], [place]  [address] Phone: [phone]; Fax: [phone]

## 2018-04-30 NOTE — Patient Instructions (Addendum)
Medication Instructions:  Your physician has recommended you make the following change in your medication: Increase hydralazine to 50 mg by mouth three times daily  If you need a refill on your cardiac medications before your next appointment, please call your pharmacy.   Lab work: Lab work to be done today--potassium level If you have labs (blood work) drawn today and your tests are completely normal, you will receive your results only by: Marland Kitchen MyChart Message (if you have MyChart) OR . A paper copy in the mail If you have any lab test that is abnormal or we need to change your treatment, we will call you to review the results.  Testing/Procedures: none  Follow-Up: At [facility], you and your health needs are our priority.  As part of our continuing mission to provide you with exceptional heart care, we have created designated Provider Care Teams.  These Care Teams include your primary Cardiologist (physician) and Advanced Practice Providers (APPs -  Physician Assistants and Nurse Practitioners) who all work together to provide you with the care you need, when you need it. You will need a follow up appointment in 12 months.  Please call our office 2 months in advance to schedule this appointment.  You may see Lauree Chandler, MD or one of the following Advanced Practice Providers on your designated Care Team:   [place], PA-C Melina Copa, PA-C . Ermalinda Barrios, PA-C  Any Other Special Instructions Will Be Listed Below (If Applicable).

## 2018-05-01 ENCOUNTER — Telehealth: Payer: Self-pay | Admitting: *Deleted

## 2018-05-01 LAB — POTASSIUM: Potassium: 4.8 mmol/L (ref 3.5–5.2)

## 2018-05-01 NOTE — Telephone Encounter (Signed)
The patient has been notified of the result and verbalized understanding.  All questions (if any) were answered. Pt has been notified of normal K+ result today. I will mail a copy of lab results to pt. Pt address was verified by phone today. Pt thanked me for the call.  Julaine Hua, [place] 05/01/2018 10:05 AM

## 2018-05-01 NOTE — Telephone Encounter (Signed)
-----   Message from Nuala Alpha, LPN sent at 4/00/8676  9:55 AM EST -----  ----- Message ----- From: Burnell Blanks, MD Sent: 05/01/2018   7:09 AM EST To: Cv Div Ch St Triage  Can we let him know that his potassium is in a normal range? Thanks, chris

## 2018-06-30 ENCOUNTER — Other Ambulatory Visit: Payer: Self-pay

## 2018-06-30 ENCOUNTER — Other Ambulatory Visit: Payer: Medicare Other | Admitting: *Deleted

## 2018-06-30 DIAGNOSIS — I480 Paroxysmal atrial fibrillation: Secondary | ICD-10-CM | POA: Diagnosis not present

## 2018-06-30 LAB — CBC WITH DIFFERENTIAL/PLATELET
Basophils Absolute: 0.1 10*3/uL (ref 0.0–0.2)
Basos: 1 %
EOS (ABSOLUTE): 0.3 10*3/uL (ref 0.0–0.4)
Eos: 3 %
Hematocrit: 44.9 % (ref 37.5–51.0)
Hemoglobin: 15 g/dL (ref 13.0–17.7)
Immature Grans (Abs): 0 10*3/uL (ref 0.0–0.1)
Immature Granulocytes: 0 %
Lymphocytes Absolute: 1.8 10*3/uL (ref 0.7–3.1)
Lymphs: 24 %
MCH: 31.3 pg (ref 26.6–33.0)
MCHC: 33.4 g/dL (ref 31.5–35.7)
MCV: 94 fL (ref 79–97)
Monocytes Absolute: 1.2 10*3/uL — ABNORMAL HIGH (ref 0.1–0.9)
Monocytes: 16 %
Neutrophils Absolute: 4.3 10*3/uL (ref 1.4–7.0)
Neutrophils: 56 %
Platelets: 289 10*3/uL (ref 150–450)
RBC: 4.79 x10E6/uL (ref 4.14–5.80)
RDW: 13.5 % (ref 11.6–15.4)
WBC: 7.7 10*3/uL (ref 3.4–10.8)

## 2018-06-30 LAB — BASIC METABOLIC PANEL
BUN/Creatinine Ratio: 20 (ref 10–24)
BUN: 37 mg/dL — ABNORMAL HIGH (ref 8–27)
CO2: 20 mmol/L (ref 20–29)
Calcium: 10 mg/dL (ref 8.6–10.2)
Chloride: 109 mmol/L — ABNORMAL HIGH (ref 96–106)
Creatinine, Ser: 1.81 mg/dL — ABNORMAL HIGH (ref 0.76–1.27)
GFR calc Af Amer: 41 mL/min/{1.73_m2} — ABNORMAL LOW (ref >59)
GFR calc non Af Amer: 35 mL/min/{1.73_m2} — ABNORMAL LOW (ref >59)
Glucose: 96 mg/dL (ref 65–99)
Potassium: 4.8 mmol/L (ref 3.5–5.2)
Sodium: 145 mmol/L — ABNORMAL HIGH (ref 134–144)

## 2018-06-30 LAB — HEPATIC FUNCTION PANEL
ALT: 20 IU/L (ref 0–44)
AST: 21 IU/L (ref 0–40)
Albumin: 4.3 g/dL (ref 3.7–4.7)
Alkaline Phosphatase: 122 IU/L — ABNORMAL HIGH (ref 39–117)
Bilirubin Total: 0.5 mg/dL (ref 0.0–1.2)
Bilirubin, Direct: 0.15 mg/dL (ref 0.00–0.40)
Total Protein: 6.9 g/dL (ref 6.0–8.5)

## 2018-07-01 ENCOUNTER — Telehealth: Payer: Self-pay

## 2018-07-01 NOTE — Telephone Encounter (Signed)
Notes recorded by Frederik Schmidt, RN on 07/01/2018 at 9:31 AM EDT The patient has been notified of the result and verbalized understanding. All questions (if any) were answered. Frederik Schmidt, RN 07/01/2018 9:31 AM

## 2018-07-01 NOTE — Telephone Encounter (Signed)
-----   Message from Burnell Blanks, MD sent at 07/01/2018  9:05 AM EDT ----- It is not clear to me why these labs were done. I think they were ordered in the coumadin clinic since he is on Eliquis. Will route to triage and to coumadin clinic. His LFTs are ok, renal function is stable and CBC is ok.   Gerald Stabs

## 2018-07-30 DIAGNOSIS — N185 Chronic kidney disease, stage 5: Secondary | ICD-10-CM | POA: Diagnosis not present

## 2018-07-30 DIAGNOSIS — C61 Malignant neoplasm of prostate: Secondary | ICD-10-CM | POA: Diagnosis not present

## 2018-08-04 ENCOUNTER — Telehealth: Payer: Self-pay | Admitting: Medical Oncology

## 2018-08-04 NOTE — Telephone Encounter (Signed)
Mr. Nee called stating he spoke with Bob Hamilton this morning to discuss the prostate support group. He was given my name and wanted to discuss  prostate cancer. He informs me he under went  a TURP in 06/2017 and was diagnosed with Gleason 8 cancer. He met with Dr. Moose-radiation oncologist in 09/2017 to discuss ADT and radiation but never started treatment. He tells me his PSA was 18.6 when diagnosed and now is 30. I discussed his Gleason score, PSA and risks/benefits of ADT and radiation. He has a bone scan and follow scheduled next week at [facility].  I strongly encouraged him to move forward with treatment. He is planning to join the prostate support group June 15 and meet other men that have received treatment. I wished him well and asked him to call me back with questions or concerns. He thanked me for taking time to talk with him and not rush him off the phone. He voiced a better understanding of ADT and radiation.   

## 2018-08-06 DIAGNOSIS — C61 Malignant neoplasm of prostate: Secondary | ICD-10-CM | POA: Diagnosis not present

## 2018-08-08 DIAGNOSIS — R9721 Rising PSA following treatment for malignant neoplasm of prostate: Secondary | ICD-10-CM | POA: Diagnosis not present

## 2018-08-08 DIAGNOSIS — K5730 Diverticulosis of large intestine without perforation or abscess without bleeding: Secondary | ICD-10-CM | POA: Diagnosis not present

## 2018-08-08 DIAGNOSIS — C61 Malignant neoplasm of prostate: Secondary | ICD-10-CM | POA: Diagnosis not present

## 2018-08-08 DIAGNOSIS — K449 Diaphragmatic hernia without obstruction or gangrene: Secondary | ICD-10-CM | POA: Diagnosis not present

## 2018-08-08 DIAGNOSIS — Z9889 Other specified postprocedural states: Secondary | ICD-10-CM | POA: Diagnosis not present

## 2018-08-08 DIAGNOSIS — N281 Cyst of kidney, acquired: Secondary | ICD-10-CM | POA: Diagnosis not present

## 2018-08-14 DIAGNOSIS — C61 Malignant neoplasm of prostate: Secondary | ICD-10-CM | POA: Diagnosis not present

## 2018-08-21 ENCOUNTER — Telehealth: Payer: Self-pay | Admitting: Cardiovascular Disease

## 2018-08-21 NOTE — Telephone Encounter (Signed)
Patient called stating he is having an in office procedure done at urology, he wants to know if he needs to stop taking Eliquis.

## 2018-08-21 NOTE — Telephone Encounter (Signed)
I spoke with pt. He is having procedure at urology office later this month and is asking if he should stop Eliquis prior to procedure.  I asked him to check with urology office and pt reports he was told to call us.  I explained to pt that we needed more information and asked him to contact urologist and have then send [place] a clearance request. Office fax number provided to pt

## 2018-08-22 ENCOUNTER — Telehealth: Payer: Self-pay | Admitting: Pharmacist

## 2018-08-22 NOTE — Telephone Encounter (Signed)
Patient with diagnosis of afib on eliquis for anticoagulation.    Procedure: Gold seeds in prostate Date of procedure: 08/29/2018  CHADS2-VASc score of  5 (CHF, HTN, AGE, DM2, stroke/tia x 2, CAD, AGE, male)  CrCl 30ml/min  Per office protocol, patient can hold Eliquis for 1-2 days prior to procedure.

## 2018-08-22 NOTE — Telephone Encounter (Signed)
[facility] is requesting that we fax over the clearance with recommendation to [phone]

## 2018-08-22 NOTE — Telephone Encounter (Signed)
   [facility] Pre-operative Risk Assessment    Request for surgical clearance:  What type of surgery is being performed? Gold seeds in Prostate 1. When is this surgery scheduled? 08/29/18   2. What type of clearance is required (medical clearance vs. Pharmacy clearance to hold med vs. Both)? Both  3. Are there any medications that need to be held prior to surgery and how long? Eliquis   4. Practice name and name of physician performing surgery? [facility], Dr. Tommi Rumps  5. What is your office phone number: [phone]   7.   What is your office fax number: [phone]  8.   Anesthesia type (None, local, MAC, general) ? Topical    Stephen Copeland 08/22/2018, 10:30 AM  _________________________________________________________________   (provider comments below)

## 2018-08-22 NOTE — Telephone Encounter (Signed)
   Primary Cardiologist: Lauree Chandler, MD  Chart reviewed as part of pre-operative protocol coverage. Patient was contacted 08/22/2018 in reference to pre-operative risk assessment for pending surgery as outlined below.  Stephen Copeland was last seen on 04/30/2018 by Dr. [name].  Since that day, Stephen Copeland has done well. He is having no anginal or heart failure type symptoms. He does not feel that he has had any recurrence of afib.   Therefore, based on ACC/AHA guidelines, the patient would be at acceptable risk for the planned low risk procedure without further cardiovascular testing.   According to our pharmacy protocol: Patient with diagnosis of afib on eliquis for anticoagulation.    Procedure: Gold seeds in prostate Date of procedure: 08/29/2018  CHADS2-VASc score of  5 (CHF, HTN, AGE, DM2, stroke/tia x 2, CAD, AGE, male)  CrCl 85ml/min  Per office protocol, patient can hold Eliquis for 1-2 days prior to procedure.     I will route this recommendation to the requesting party via Epic fax function and remove from pre-op pool.  Please call with questions.  Daune Perch, NP 08/22/2018, 4:44 PM

## 2018-08-29 DIAGNOSIS — C61 Malignant neoplasm of prostate: Secondary | ICD-10-CM | POA: Diagnosis not present

## 2018-09-15 DIAGNOSIS — Z79899 Other long term (current) drug therapy: Secondary | ICD-10-CM | POA: Diagnosis not present

## 2018-09-15 DIAGNOSIS — C61 Malignant neoplasm of prostate: Secondary | ICD-10-CM | POA: Diagnosis not present

## 2018-09-23 DIAGNOSIS — C61 Malignant neoplasm of prostate: Secondary | ICD-10-CM | POA: Diagnosis not present

## 2018-09-24 DIAGNOSIS — K862 Cyst of pancreas: Secondary | ICD-10-CM | POA: Diagnosis not present

## 2018-09-24 DIAGNOSIS — K449 Diaphragmatic hernia without obstruction or gangrene: Secondary | ICD-10-CM | POA: Diagnosis not present

## 2018-09-24 DIAGNOSIS — K5730 Diverticulosis of large intestine without perforation or abscess without bleeding: Secondary | ICD-10-CM | POA: Diagnosis not present

## 2018-09-24 DIAGNOSIS — N281 Cyst of kidney, acquired: Secondary | ICD-10-CM | POA: Diagnosis not present

## 2018-09-24 DIAGNOSIS — C61 Malignant neoplasm of prostate: Secondary | ICD-10-CM | POA: Diagnosis not present

## 2018-09-24 DIAGNOSIS — Z510 Encounter for antineoplastic radiation therapy: Secondary | ICD-10-CM | POA: Diagnosis not present

## 2018-09-24 DIAGNOSIS — K869 Disease of pancreas, unspecified: Secondary | ICD-10-CM | POA: Diagnosis not present

## 2018-09-24 DIAGNOSIS — K8050 Calculus of bile duct without cholangitis or cholecystitis without obstruction: Secondary | ICD-10-CM | POA: Diagnosis not present

## 2018-10-02 ENCOUNTER — Other Ambulatory Visit: Payer: Self-pay

## 2018-10-07 DIAGNOSIS — Z510 Encounter for antineoplastic radiation therapy: Secondary | ICD-10-CM | POA: Diagnosis not present

## 2018-10-07 DIAGNOSIS — C61 Malignant neoplasm of prostate: Secondary | ICD-10-CM | POA: Diagnosis not present

## 2018-10-11 ENCOUNTER — Other Ambulatory Visit: Payer: Self-pay | Admitting: Cardiovascular Disease

## 2018-10-13 DIAGNOSIS — Z510 Encounter for antineoplastic radiation therapy: Secondary | ICD-10-CM | POA: Diagnosis not present

## 2018-10-13 DIAGNOSIS — C61 Malignant neoplasm of prostate: Secondary | ICD-10-CM | POA: Diagnosis not present

## 2018-10-14 DIAGNOSIS — C61 Malignant neoplasm of prostate: Secondary | ICD-10-CM | POA: Diagnosis not present

## 2018-10-14 DIAGNOSIS — Z510 Encounter for antineoplastic radiation therapy: Secondary | ICD-10-CM | POA: Diagnosis not present

## 2018-10-15 DIAGNOSIS — C61 Malignant neoplasm of prostate: Secondary | ICD-10-CM | POA: Diagnosis not present

## 2018-10-15 DIAGNOSIS — Z510 Encounter for antineoplastic radiation therapy: Secondary | ICD-10-CM | POA: Diagnosis not present

## 2018-10-16 DIAGNOSIS — Z510 Encounter for antineoplastic radiation therapy: Secondary | ICD-10-CM | POA: Diagnosis not present

## 2018-10-16 DIAGNOSIS — C61 Malignant neoplasm of prostate: Secondary | ICD-10-CM | POA: Diagnosis not present

## 2018-10-17 DIAGNOSIS — Z510 Encounter for antineoplastic radiation therapy: Secondary | ICD-10-CM | POA: Diagnosis not present

## 2018-10-17 DIAGNOSIS — C61 Malignant neoplasm of prostate: Secondary | ICD-10-CM | POA: Diagnosis not present

## 2018-10-20 DIAGNOSIS — Z510 Encounter for antineoplastic radiation therapy: Secondary | ICD-10-CM | POA: Diagnosis not present

## 2018-10-20 DIAGNOSIS — C61 Malignant neoplasm of prostate: Secondary | ICD-10-CM | POA: Diagnosis not present

## 2018-10-21 DIAGNOSIS — Z510 Encounter for antineoplastic radiation therapy: Secondary | ICD-10-CM | POA: Diagnosis not present

## 2018-10-21 DIAGNOSIS — C61 Malignant neoplasm of prostate: Secondary | ICD-10-CM | POA: Diagnosis not present

## 2018-10-22 DIAGNOSIS — Z510 Encounter for antineoplastic radiation therapy: Secondary | ICD-10-CM | POA: Diagnosis not present

## 2018-10-22 DIAGNOSIS — C61 Malignant neoplasm of prostate: Secondary | ICD-10-CM | POA: Diagnosis not present

## 2018-10-23 DIAGNOSIS — Z510 Encounter for antineoplastic radiation therapy: Secondary | ICD-10-CM | POA: Diagnosis not present

## 2018-10-23 DIAGNOSIS — C61 Malignant neoplasm of prostate: Secondary | ICD-10-CM | POA: Diagnosis not present

## 2018-10-24 DIAGNOSIS — Z510 Encounter for antineoplastic radiation therapy: Secondary | ICD-10-CM | POA: Diagnosis not present

## 2018-10-24 DIAGNOSIS — C61 Malignant neoplasm of prostate: Secondary | ICD-10-CM | POA: Diagnosis not present

## 2018-10-27 DIAGNOSIS — Z510 Encounter for antineoplastic radiation therapy: Secondary | ICD-10-CM | POA: Diagnosis not present

## 2018-10-27 DIAGNOSIS — C61 Malignant neoplasm of prostate: Secondary | ICD-10-CM | POA: Diagnosis not present

## 2018-10-28 DIAGNOSIS — C61 Malignant neoplasm of prostate: Secondary | ICD-10-CM | POA: Diagnosis not present

## 2018-10-28 DIAGNOSIS — Z510 Encounter for antineoplastic radiation therapy: Secondary | ICD-10-CM | POA: Diagnosis not present

## 2018-10-29 DIAGNOSIS — Z510 Encounter for antineoplastic radiation therapy: Secondary | ICD-10-CM | POA: Diagnosis not present

## 2018-10-29 DIAGNOSIS — C61 Malignant neoplasm of prostate: Secondary | ICD-10-CM | POA: Diagnosis not present

## 2018-10-30 DIAGNOSIS — C61 Malignant neoplasm of prostate: Secondary | ICD-10-CM | POA: Diagnosis not present

## 2018-10-30 DIAGNOSIS — Z510 Encounter for antineoplastic radiation therapy: Secondary | ICD-10-CM | POA: Diagnosis not present

## 2018-10-31 DIAGNOSIS — C61 Malignant neoplasm of prostate: Secondary | ICD-10-CM | POA: Diagnosis not present

## 2018-10-31 DIAGNOSIS — Z510 Encounter for antineoplastic radiation therapy: Secondary | ICD-10-CM | POA: Diagnosis not present

## 2018-11-03 DIAGNOSIS — Z510 Encounter for antineoplastic radiation therapy: Secondary | ICD-10-CM | POA: Diagnosis not present

## 2018-11-03 DIAGNOSIS — C61 Malignant neoplasm of prostate: Secondary | ICD-10-CM | POA: Diagnosis not present

## 2018-11-04 DIAGNOSIS — C61 Malignant neoplasm of prostate: Secondary | ICD-10-CM | POA: Diagnosis not present

## 2018-11-04 DIAGNOSIS — Z510 Encounter for antineoplastic radiation therapy: Secondary | ICD-10-CM | POA: Diagnosis not present

## 2018-11-05 DIAGNOSIS — Z510 Encounter for antineoplastic radiation therapy: Secondary | ICD-10-CM | POA: Diagnosis not present

## 2018-11-05 DIAGNOSIS — C61 Malignant neoplasm of prostate: Secondary | ICD-10-CM | POA: Diagnosis not present

## 2018-11-06 DIAGNOSIS — C61 Malignant neoplasm of prostate: Secondary | ICD-10-CM | POA: Diagnosis not present

## 2018-11-06 DIAGNOSIS — Z510 Encounter for antineoplastic radiation therapy: Secondary | ICD-10-CM | POA: Diagnosis not present

## 2018-11-07 DIAGNOSIS — Z510 Encounter for antineoplastic radiation therapy: Secondary | ICD-10-CM | POA: Diagnosis not present

## 2018-11-07 DIAGNOSIS — C61 Malignant neoplasm of prostate: Secondary | ICD-10-CM | POA: Diagnosis not present

## 2018-11-11 DIAGNOSIS — C61 Malignant neoplasm of prostate: Secondary | ICD-10-CM | POA: Diagnosis not present

## 2018-11-11 DIAGNOSIS — Z510 Encounter for antineoplastic radiation therapy: Secondary | ICD-10-CM | POA: Diagnosis not present

## 2018-11-12 DIAGNOSIS — Z510 Encounter for antineoplastic radiation therapy: Secondary | ICD-10-CM | POA: Diagnosis not present

## 2018-11-12 DIAGNOSIS — C61 Malignant neoplasm of prostate: Secondary | ICD-10-CM | POA: Diagnosis not present

## 2018-11-13 DIAGNOSIS — C61 Malignant neoplasm of prostate: Secondary | ICD-10-CM | POA: Diagnosis not present

## 2018-11-13 DIAGNOSIS — Z510 Encounter for antineoplastic radiation therapy: Secondary | ICD-10-CM | POA: Diagnosis not present

## 2018-11-14 DIAGNOSIS — Z510 Encounter for antineoplastic radiation therapy: Secondary | ICD-10-CM | POA: Diagnosis not present

## 2018-11-14 DIAGNOSIS — C61 Malignant neoplasm of prostate: Secondary | ICD-10-CM | POA: Diagnosis not present

## 2018-11-17 DIAGNOSIS — C61 Malignant neoplasm of prostate: Secondary | ICD-10-CM | POA: Diagnosis not present

## 2018-11-17 DIAGNOSIS — Z510 Encounter for antineoplastic radiation therapy: Secondary | ICD-10-CM | POA: Diagnosis not present

## 2018-11-18 DIAGNOSIS — C61 Malignant neoplasm of prostate: Secondary | ICD-10-CM | POA: Diagnosis not present

## 2018-11-18 DIAGNOSIS — Z510 Encounter for antineoplastic radiation therapy: Secondary | ICD-10-CM | POA: Diagnosis not present

## 2018-11-19 DIAGNOSIS — Z510 Encounter for antineoplastic radiation therapy: Secondary | ICD-10-CM | POA: Diagnosis not present

## 2018-11-19 DIAGNOSIS — C61 Malignant neoplasm of prostate: Secondary | ICD-10-CM | POA: Diagnosis not present

## 2018-11-21 DIAGNOSIS — Z510 Encounter for antineoplastic radiation therapy: Secondary | ICD-10-CM | POA: Diagnosis not present

## 2018-11-21 DIAGNOSIS — C61 Malignant neoplasm of prostate: Secondary | ICD-10-CM | POA: Diagnosis not present

## 2018-11-24 DIAGNOSIS — Z510 Encounter for antineoplastic radiation therapy: Secondary | ICD-10-CM | POA: Diagnosis not present

## 2018-11-24 DIAGNOSIS — C61 Malignant neoplasm of prostate: Secondary | ICD-10-CM | POA: Diagnosis not present

## 2018-11-27 DIAGNOSIS — C61 Malignant neoplasm of prostate: Secondary | ICD-10-CM | POA: Diagnosis not present

## 2018-11-27 DIAGNOSIS — Z510 Encounter for antineoplastic radiation therapy: Secondary | ICD-10-CM | POA: Diagnosis not present

## 2018-11-28 DIAGNOSIS — Z510 Encounter for antineoplastic radiation therapy: Secondary | ICD-10-CM | POA: Diagnosis not present

## 2018-11-28 DIAGNOSIS — C61 Malignant neoplasm of prostate: Secondary | ICD-10-CM | POA: Diagnosis not present

## 2018-12-01 DIAGNOSIS — Z510 Encounter for antineoplastic radiation therapy: Secondary | ICD-10-CM | POA: Diagnosis not present

## 2018-12-01 DIAGNOSIS — C61 Malignant neoplasm of prostate: Secondary | ICD-10-CM | POA: Diagnosis not present

## 2018-12-02 DIAGNOSIS — Z510 Encounter for antineoplastic radiation therapy: Secondary | ICD-10-CM | POA: Diagnosis not present

## 2018-12-02 DIAGNOSIS — C61 Malignant neoplasm of prostate: Secondary | ICD-10-CM | POA: Diagnosis not present

## 2018-12-03 DIAGNOSIS — C61 Malignant neoplasm of prostate: Secondary | ICD-10-CM | POA: Diagnosis not present

## 2018-12-03 DIAGNOSIS — Z510 Encounter for antineoplastic radiation therapy: Secondary | ICD-10-CM | POA: Diagnosis not present

## 2018-12-04 DIAGNOSIS — Z510 Encounter for antineoplastic radiation therapy: Secondary | ICD-10-CM | POA: Diagnosis not present

## 2018-12-04 DIAGNOSIS — C61 Malignant neoplasm of prostate: Secondary | ICD-10-CM | POA: Diagnosis not present

## 2018-12-05 DIAGNOSIS — Z510 Encounter for antineoplastic radiation therapy: Secondary | ICD-10-CM | POA: Diagnosis not present

## 2018-12-05 DIAGNOSIS — C61 Malignant neoplasm of prostate: Secondary | ICD-10-CM | POA: Diagnosis not present

## 2018-12-08 DIAGNOSIS — C61 Malignant neoplasm of prostate: Secondary | ICD-10-CM | POA: Diagnosis not present

## 2018-12-08 DIAGNOSIS — Z510 Encounter for antineoplastic radiation therapy: Secondary | ICD-10-CM | POA: Diagnosis not present

## 2018-12-09 DIAGNOSIS — Z510 Encounter for antineoplastic radiation therapy: Secondary | ICD-10-CM | POA: Diagnosis not present

## 2018-12-09 DIAGNOSIS — C61 Malignant neoplasm of prostate: Secondary | ICD-10-CM | POA: Diagnosis not present

## 2018-12-10 DIAGNOSIS — Z510 Encounter for antineoplastic radiation therapy: Secondary | ICD-10-CM | POA: Diagnosis not present

## 2018-12-10 DIAGNOSIS — C61 Malignant neoplasm of prostate: Secondary | ICD-10-CM | POA: Diagnosis not present

## 2019-01-06 DIAGNOSIS — H40013 Open angle with borderline findings, low risk, bilateral: Secondary | ICD-10-CM | POA: Diagnosis not present

## 2019-01-06 DIAGNOSIS — H26493 Other secondary cataract, bilateral: Secondary | ICD-10-CM | POA: Diagnosis not present

## 2019-01-06 DIAGNOSIS — Z961 Presence of intraocular lens: Secondary | ICD-10-CM | POA: Diagnosis not present

## 2019-02-12 ENCOUNTER — Telehealth: Payer: Self-pay | Admitting: Cardiovascular Disease

## 2019-02-12 NOTE — Telephone Encounter (Signed)
° ° ° °  What dental office are you calling from? DR Glennon Hamilton, [facility] 1. What is your office phone number? [phone]  2. What is your fax number? [phone]  3. What type of procedure is the patient having performed? 1 EXTRACTION , BRIDGE  4. What date is procedure scheduled or is the patient there now? TBD (if the patient is at the dentist's office question goes to their cardiologist if he/she is in the office.  If not, question should go to the DOD).   5. What is your question (ex. Antibiotics prior to procedure, holding medication-we need to know how long dentist wants pt to hold med)? ELIQUIS

## 2019-02-12 NOTE — Telephone Encounter (Signed)
   Primary Cardiologist: Lauree Chandler, MD  Chart reviewed as part of pre-operative protocol coverage. Simple dental extractions are considered low risk procedures per guidelines and generally do not require any specific cardiac clearance. It is also generally accepted that for simple extractions and dental cleanings, there is no need to interrupt blood thinner therapy.   SBE prophylaxis is not required for the patient.  I will route this recommendation to the requesting party via Epic fax function and remove from pre-op pool.  Please call with questions.  Kerin Ransom, PA-C 02/12/2019, 9:42 AM

## 2019-02-16 DIAGNOSIS — C61 Malignant neoplasm of prostate: Secondary | ICD-10-CM | POA: Diagnosis not present

## 2019-02-16 DIAGNOSIS — N185 Chronic kidney disease, stage 5: Secondary | ICD-10-CM | POA: Diagnosis not present

## 2019-02-17 ENCOUNTER — Other Ambulatory Visit: Payer: Self-pay | Admitting: Cardiovascular Disease

## 2019-02-17 MED ORDER — APIXABAN 5 MG PO TABS: 5.0000 mg | ORAL_TABLET | Freq: Two times a day (BID) | ORAL | 1 refills | Status: DC

## 2019-02-17 NOTE — Telephone Encounter (Signed)
New Message   *STAT* If patient is at the pharmacy, call can be transferred to refill team.   1. Which medications need to be refilled? (please list name of each medication and dose if known) apixaban (ELIQUIS) 5 MG TABS tablet  2. Which pharmacy/location (including street and city if local pharmacy) is medication to be sent to? [facility] [place], [place]  3. Do they need a 30 day or 90 day supply? 90 day

## 2019-02-17 NOTE — Telephone Encounter (Signed)
Eliquis 5mg  refill request received, pt is 78 yrs old, weight-85.3kg, Crea-1.81 on 06/30/2018, Diagnosis-Afib, and last seen by Dr. [name] on 04/30/2018 and has an appt pending on 04/14/2019 with Estella Husk. Dose is appropriate based on dosing criteria. Will send in refill to requested pharmacy.

## 2019-02-24 NOTE — Telephone Encounter (Signed)
Pt takes Eliquis for afib with CHADS2VASc score of 5 (age x2, CHF, HTN, CAD). SCr 1.81, CrCl is 26mL/min. Ok to hold Eliquis for 2 days prior to 4 extractions.

## 2019-02-24 NOTE — Telephone Encounter (Signed)
   [facility] calling, states patient is now going to have surgical extraction. 4 teeth to be extracted. Please update clearance

## 2019-04-07 ENCOUNTER — Other Ambulatory Visit: Payer: Self-pay | Admitting: Cardiovascular Disease

## 2019-04-09 ENCOUNTER — Other Ambulatory Visit: Payer: Self-pay | Admitting: Cardiovascular Disease

## 2019-04-13 NOTE — Progress Notes (Signed)
Cardiology Office Note    Date:  04/14/2019   ID:  Stephen Copeland, DOB 1940-12-07, MRN [id number]  PCP:  [name]., MD  Cardiologist: Lauree Chandler, MD EPS: None  Chief Complaint  Patient presents with  . Follow-up    History of Present Illness:  Stephen Copeland is a 79 y.o. male with history of PAF on Eliquis and amiodarone, hypertension, HLD, sinus bradycardia, GERD.  2017 echo LVEF 40% outpatient stress test 01/2016 high risk with inferior and apical thinning with perfusion defect in the mid anterior wall and apex.  Cath was planned he then developed hematuria, UTI, sepsis and pneumonia 05/2016.  Amiodarone and Lipitor were stopped.  Echo 12/2016 LVEF 30 to 35% with mild MR.  Renal failure with creatinine up to 8.10 March 2017 secondary to urinary obstruction.  Then diagnosed with prostate cancer.  Last saw Dr. [name] 04/30/2018 and was having no chest pain.  Patient did not want to proceed with cardiac catheterization especially given multiple chronic issues.  No ARB or ACE inhibitor due to renal insufficiency.  Patient denies chest pain, dyspnea, dizziness or presyncope, edema. Sometimes hears his heart beat in his ear.  Past Medical History:  Diagnosis Date  . Acid reflux   . CHF (congestive heart failure) ([place])   . Coronary artery disease   . Dilated cardiomyopathy ([place])   . Folliculitis   . Gastric ulcer   . Hiatal hernia   . Hyperlipidemia    a. pt is adamantly against statins.  . Hypertension   . Ischemia    a. Pt states he was diagnosed with "ischemia" in the 1990s but does not know further details, denies hx of heart blockage.  . Nummular dermatitis   . Prostate cancer ([place])   . Renal disorder   . Scoliosis   . Sinus bradycardia   . Stomach ulcer    a. remote ulcer in the 1990s, no hx of bleeding.    Past Surgical History:  Procedure Laterality Date  . INGUINAL HERNIA REPAIR Left     Current Medications: Current Meds  Medication Sig    . apixaban (ELIQUIS) 5 MG TABS tablet Take 1 tablet (5 mg total) by mouth 2 (two) times daily.  . hydrALAZINE (APRESOLINE) 50 MG tablet Take 1 tablet (50 mg total) by mouth 3 (three) times daily. Pt needs to keep upcoming appt in Feb for continued refills Thanks  . hydrocortisone cream 1 % Apply 1 application topically 2 (two) times daily.  Marland Kitchen lidocaine (XYLOCAINE) 5 % ointment Apply 1 application topically as needed.  . metoprolol tartrate (LOPRESSOR) 25 MG tablet TAKE 1 TABLET TWICE A DAY  . nitroGLYCERIN (NITROSTAT) 0.4 MG SL tablet Place under the tongue every 5 (five) minutes as needed for chest pain.   Marland Kitchen omeprazole (PRILOSEC) 20 MG capsule Take 1 capsule (20 mg total) by mouth daily.  . tamsulosin (FLOMAX) 0.4 MG CAPS capsule Take 0.4 mg by mouth daily after supper.      Allergies:   Patient has no known allergies.   Social History   Socioeconomic History  . Marital status: Married    Spouse name: Not on file  . Number of children: Not on file  . Years of education: Not on file  . Highest education level: Not on file  Occupational History  . Occupation: retired    Fish farm manager: [employer]  Tobacco Use  . Smoking status: Never Smoker  . Smokeless tobacco: Never Used  Substance and Sexual Activity  .  Alcohol use: No  . Drug use: No  . Sexual activity: Not on file  Other Topics Concern  . Not on file  Social History Narrative  . Not on file   Social Determinants of Health   Financial Resource Strain:   . Difficulty of Paying Living Expenses: Not on file  Food Insecurity:   . Worried About Charity fundraiser in the Last Year: Not on file  . Ran Out of Food in the Last Year: Not on file  Transportation Needs:   . Lack of Transportation (Medical): Not on file  . Lack of Transportation (Non-Medical): Not on file  Physical Activity:   . Days of Exercise per Week: Not on file  . Minutes of Exercise per Session: Not on file  Stress:   . Feeling of Stress : Not on file  Social  Connections:   . Frequency of Communication with Friends and Family: Not on file  . Frequency of Social Gatherings with Friends and Family: Not on file  . Attends Religious Services: Not on file  . Active Member of Clubs or Organizations: Not on file  . Attends Archivist Meetings: Not on file  . Marital Status: Not on file     Family History:  The patient's family history includes Heart disease in his mother; Valvular heart disease in his father.   ROS:   Please see the history of present illness.    ROS All other systems reviewed and are negative.   PHYSICAL EXAM:   VS:  BP (!) 140/58   Pulse (!) 56   Ht 6\' 2"  (1.88 m)   Wt 186 lb 6.4 oz (84.6 kg)   SpO2 94%   BMI 23.93 kg/m   Physical Exam  GEN: Well nourished, well developed, in no acute distress  Neck: no JVD, carotid bruits, or masses Cardiac:RRR; no murmurs, rubs, or gallops  Respiratory:  clear to auscultation bilaterally, normal work of breathing GI: soft, nontender, nondistended, + BS Ext: without cyanosis, clubbing, or edema, Good distal pulses bilaterally Neuro:  Alert and Oriented x 3 Psych: euthymic mood, full affect  Wt Readings from Last 3 Encounters:  04/14/19 186 lb 6.4 oz (84.6 kg)  04/30/18 188 lb (85.3 kg)  10/24/17 162 lb 1.9 oz (73.5 kg)      Studies/Labs Reviewed:   EKG:  EKG is not ordered today.    Recent Labs: 06/30/2018: ALT 20; BUN 37; Creatinine, Ser 1.81; Hemoglobin 15.0; Platelets 289; Potassium 4.8; Sodium 145   Lipid Panel    Component Value Date/Time   CHOL 130 12/20/2015 0325   TRIG 51 12/20/2015 0325   HDL 27 (L) 12/20/2015 0325   CHOLHDL 4.8 12/20/2015 0325   VLDL 10 12/20/2015 0325   LDLCALC 93 12/20/2015 0325    Additional studies/ records that were reviewed today include:     Echo 12/20/15: Left ventricle: The cavity size was normal. There was moderate   concentric hypertrophy. Systolic function was moderately reduced.   The estimated ejection fraction  was 40%. Diffuse hypokinesis.   There is severe hypokinesis of the basal-midinferolateral,   inferior, and inferoseptal myocardium. - Ventricular septum: Septal motion showed moderate paradox. These   changes are consistent with intraventricular conduction delay. - Aortic valve: Poorly visualized. Trileaflet; normal thickness,   mildly calcified leaflets. - Mitral valve: There was mild regurgitation. - Left atrium: The atrium was severely dilated. - Right atrium: The atrium was mildly dilated. - Pulmonic valve: There was mild  regurgitation. - Pulmonary arteries: PA peak pressure: 35 mm Hg (S).     ASSESSMENT:    1. Abnormal nuclear stress test   2. Chronic systolic CHF (congestive heart failure) ([place])   3. Dilated cardiomyopathy ([facility])   4. Paroxysmal atrial fibrillation ([place])   5. Essential hypertension      PLAN:  In order of problems listed above:  Abnormal NST 2017 but never cath because of multiple health issues as stated above. No angina  Chronic systolic CHF and cardiomyopathy LVEF 35% on echo 12/2017 no ARB or ACE inhibitor due to renal insufficiency. No heart failure on exam.   PAF on metoprolol and Eliquis will check surveillance labs  Essential hypertension-BP controlled    Medication Adjustments/Labs and Tests Ordered: Current medicines are reviewed at length with the patient today.  Concerns regarding medicines are outlined above.  Medication changes, Labs and Tests ordered today are listed in the Patient Instructions below. There are no Patient Instructions on file for this visit.   Sumner Boast, PA-C  04/14/2019 12:57 PM    [facility] [place], [place], [place]  [address] Phone: [phone]; Fax: [phone]

## 2019-04-14 ENCOUNTER — Ambulatory Visit (INDEPENDENT_AMBULATORY_CARE_PROVIDER_SITE_OTHER): Payer: Medicare Other | Admitting: Physician Assistant

## 2019-04-14 ENCOUNTER — Encounter: Payer: Self-pay | Admitting: Physician Assistant

## 2019-04-14 ENCOUNTER — Other Ambulatory Visit: Payer: Self-pay

## 2019-04-14 VITALS — BP 140/58 | HR 56 | Ht 74.0 in | Wt 186.4 lb

## 2019-04-14 DIAGNOSIS — I5022 Chronic systolic (congestive) heart failure: Secondary | ICD-10-CM | POA: Diagnosis not present

## 2019-04-14 DIAGNOSIS — R9439 Abnormal result of other cardiovascular function study: Secondary | ICD-10-CM | POA: Diagnosis not present

## 2019-04-14 DIAGNOSIS — I480 Paroxysmal atrial fibrillation: Secondary | ICD-10-CM | POA: Diagnosis not present

## 2019-04-14 DIAGNOSIS — I10 Essential (primary) hypertension: Secondary | ICD-10-CM | POA: Diagnosis not present

## 2019-04-14 DIAGNOSIS — I420 Dilated cardiomyopathy: Secondary | ICD-10-CM | POA: Diagnosis not present

## 2019-04-14 NOTE — Patient Instructions (Signed)
Medication Instructions:  Your physician recommends that you continue on your current medications as directed. Please refer to the Current Medication list given to you today.  *If you need a refill on your cardiac medications before your next appointment, please call your pharmacy*  Lab Work: TODAY: CMET, CBC  If you have labs (blood work) drawn today and your tests are completely normal, you will receive your results only by: Marland Kitchen MyChart Message (if you have MyChart) OR . A paper copy in the mail If you have any lab test that is abnormal or we need to change your treatment, we will call you to review the results.  Testing/Procedures: None ordered  Follow-Up: At [facility], you and your health needs are our priority.  As part of our continuing mission to provide you with exceptional heart care, we have created designated Provider Care Teams.  These Care Teams include your primary Cardiologist (physician) and Advanced Practice Providers (APPs -  Physician Assistants and Nurse Practitioners) who all work together to provide you with the care you need, when you need it.  Your next appointment:   12 month(s)  The format for your next appointment:   In Person  Provider:   You may see Lauree Chandler, MD or one of the following Advanced Practice Providers on your designated Care Team:    Melina Copa, PA-C  Ermalinda Barrios, PA-C   Other Instructions We are recommending the COVID-19 vaccine to all of our patients. Cardiac medications (including blood thinners) should not deter anyone from being vaccinated and there is no need to hold any of those medications prior to vaccine administration.   Currently, there is a hotline to call (active 03/13/19) to schedule vaccination appointments as no walk-ins will be accepted.    Vaccines through the health department can be arranged by calling [phone]    Vaccines through [facility] can be arranged by calling [phone] or visiting  [email or web address]   If you have further questions or concerns about the vaccine process, please visit [email or web address], [email or web address], or contact your primary care physician.

## 2019-04-15 LAB — CBC
Hematocrit: 41.7 % (ref 37.5–51.0)
Hemoglobin: 14.3 g/dL (ref 13.0–17.7)
MCH: 32.2 pg (ref 26.6–33.0)
MCHC: 34.3 g/dL (ref 31.5–35.7)
MCV: 94 fL (ref 79–97)
Platelets: 252 10*3/uL (ref 150–450)
RBC: 4.44 x10E6/uL (ref 4.14–5.80)
RDW: 13 % (ref 11.6–15.4)
WBC: 5.8 10*3/uL (ref 3.4–10.8)

## 2019-04-15 LAB — COMPREHENSIVE METABOLIC PANEL WITH GFR
ALT: 13 IU/L (ref 0–44)
AST: 17 IU/L (ref 0–40)
Albumin/Globulin Ratio: 1.5 (ref 1.2–2.2)
Albumin: 4.1 g/dL (ref 3.7–4.7)
Alkaline Phosphatase: 108 IU/L (ref 39–117)
BUN/Creatinine Ratio: 16 (ref 10–24)
BUN: 28 mg/dL — ABNORMAL HIGH (ref 8–27)
Bilirubin Total: 0.8 mg/dL (ref 0.0–1.2)
CO2: 22 mmol/L (ref 20–29)
Calcium: 10 mg/dL (ref 8.6–10.2)
Chloride: 109 mmol/L — ABNORMAL HIGH (ref 96–106)
Creatinine, Ser: 1.76 mg/dL — ABNORMAL HIGH (ref 0.76–1.27)
GFR calc Af Amer: 42 mL/min/1.73 — ABNORMAL LOW
GFR calc non Af Amer: 36 mL/min/1.73 — ABNORMAL LOW
Globulin, Total: 2.8 g/dL (ref 1.5–4.5)
Glucose: 102 mg/dL — ABNORMAL HIGH (ref 65–99)
Potassium: 4.6 mmol/L (ref 3.5–5.2)
Sodium: 144 mmol/L (ref 134–144)
Total Protein: 6.9 g/dL (ref 6.0–8.5)

## 2019-04-16 DIAGNOSIS — I5022 Chronic systolic (congestive) heart failure: Secondary | ICD-10-CM | POA: Diagnosis not present

## 2019-04-16 DIAGNOSIS — L989 Disorder of the skin and subcutaneous tissue, unspecified: Secondary | ICD-10-CM | POA: Diagnosis not present

## 2019-04-16 DIAGNOSIS — I130 Hypertensive heart and chronic kidney disease with heart failure and stage 1 through stage 4 chronic kidney disease, or unspecified chronic kidney disease: Secondary | ICD-10-CM | POA: Diagnosis not present

## 2019-04-16 DIAGNOSIS — I25119 Atherosclerotic heart disease of native coronary artery with unspecified angina pectoris: Secondary | ICD-10-CM | POA: Diagnosis not present

## 2019-04-16 DIAGNOSIS — E7849 Other hyperlipidemia: Secondary | ICD-10-CM | POA: Diagnosis not present

## 2019-04-16 DIAGNOSIS — I10 Essential (primary) hypertension: Secondary | ICD-10-CM | POA: Diagnosis not present

## 2019-05-22 ENCOUNTER — Telehealth: Payer: Self-pay | Admitting: Cardiovascular Disease

## 2019-05-22 NOTE — Telephone Encounter (Signed)
Called patient and adv that no referrals have been sent for nephrology from our office.  He thinks it may have been sent  by his new PCP.

## 2019-05-22 NOTE — Telephone Encounter (Signed)
Patient states he received a call from [facility] from Dr. Legrand Como Rocco's office stating they received a referral from Dr. Camillia Herter office. Patient states that the office would not tell him any information about the appointment except that is a consultation and is scheduled for 05/28/19. Patient was unaware a referral was sent and wants to verify if the referral actually came from Dr. [name] or his PCP.

## 2019-06-24 DIAGNOSIS — N138 Other obstructive and reflux uropathy: Secondary | ICD-10-CM | POA: Diagnosis not present

## 2019-06-24 DIAGNOSIS — N401 Enlarged prostate with lower urinary tract symptoms: Secondary | ICD-10-CM | POA: Diagnosis not present

## 2019-06-24 DIAGNOSIS — C61 Malignant neoplasm of prostate: Secondary | ICD-10-CM | POA: Diagnosis not present

## 2019-06-24 DIAGNOSIS — N185 Chronic kidney disease, stage 5: Secondary | ICD-10-CM | POA: Diagnosis not present

## 2019-07-06 ENCOUNTER — Other Ambulatory Visit: Payer: Self-pay | Admitting: Cardiovascular Disease

## 2019-07-08 ENCOUNTER — Other Ambulatory Visit: Payer: Self-pay | Admitting: Cardiovascular Disease

## 2019-07-09 ENCOUNTER — Inpatient Hospital Stay (HOSPITAL_COMMUNITY)
Admission: EM | Admit: 2019-07-09 | Discharge: 2019-08-26 | DRG: 023 | Disposition: A | Discharge status: Skilled Nursing Facility | Payer: Medicare Other | Attending: Neurology | Admitting: Neurology

## 2019-07-09 ENCOUNTER — Inpatient Hospital Stay (HOSPITAL_COMMUNITY): Payer: Medicare Other | Admitting: Anesthesiology

## 2019-07-09 ENCOUNTER — Encounter (HOSPITAL_COMMUNITY)
Admission: EM | Disposition: A | Discharge status: Skilled Nursing Facility | Payer: Self-pay | Source: Home / Self Care | Attending: Neurology

## 2019-07-09 ENCOUNTER — Emergency Department (HOSPITAL_COMMUNITY): Payer: Medicare Other

## 2019-07-09 DIAGNOSIS — N1832 Chronic kidney disease, stage 3b: Secondary | ICD-10-CM | POA: Diagnosis not present

## 2019-07-09 DIAGNOSIS — I110 Hypertensive heart disease with heart failure: Secondary | ICD-10-CM | POA: Diagnosis not present

## 2019-07-09 DIAGNOSIS — Z931 Gastrostomy status: Secondary | ICD-10-CM

## 2019-07-09 DIAGNOSIS — K591 Functional diarrhea: Secondary | ICD-10-CM | POA: Diagnosis not present

## 2019-07-09 DIAGNOSIS — R1312 Dysphagia, oropharyngeal phase: Secondary | ICD-10-CM | POA: Diagnosis not present

## 2019-07-09 DIAGNOSIS — J988 Other specified respiratory disorders: Secondary | ICD-10-CM | POA: Diagnosis not present

## 2019-07-09 DIAGNOSIS — I6503 Occlusion and stenosis of bilateral vertebral arteries: Secondary | ICD-10-CM | POA: Diagnosis not present

## 2019-07-09 DIAGNOSIS — D72829 Elevated white blood cell count, unspecified: Secondary | ICD-10-CM

## 2019-07-09 DIAGNOSIS — R4702 Dysphasia: Secondary | ICD-10-CM | POA: Diagnosis present

## 2019-07-09 DIAGNOSIS — Z20822 Contact with and (suspected) exposure to covid-19: Secondary | ICD-10-CM | POA: Diagnosis not present

## 2019-07-09 DIAGNOSIS — I6389 Other cerebral infarction: Secondary | ICD-10-CM | POA: Diagnosis not present

## 2019-07-09 DIAGNOSIS — I69391 Dysphagia following cerebral infarction: Secondary | ICD-10-CM | POA: Diagnosis not present

## 2019-07-09 DIAGNOSIS — Z6825 Body mass index (BMI) 25.0-25.9, adult: Secondary | ICD-10-CM

## 2019-07-09 DIAGNOSIS — Y92239 Unspecified place in hospital as the place of occurrence of the external cause: Secondary | ICD-10-CM | POA: Diagnosis not present

## 2019-07-09 DIAGNOSIS — E860 Dehydration: Secondary | ICD-10-CM

## 2019-07-09 DIAGNOSIS — Z4659 Encounter for fitting and adjustment of other gastrointestinal appliance and device: Secondary | ICD-10-CM

## 2019-07-09 DIAGNOSIS — Z79899 Other long term (current) drug therapy: Secondary | ICD-10-CM

## 2019-07-09 DIAGNOSIS — R570 Cardiogenic shock: Secondary | ICD-10-CM | POA: Diagnosis not present

## 2019-07-09 DIAGNOSIS — J984 Other disorders of lung: Secondary | ICD-10-CM | POA: Diagnosis not present

## 2019-07-09 DIAGNOSIS — R1310 Dysphagia, unspecified: Secondary | ICD-10-CM | POA: Diagnosis not present

## 2019-07-09 DIAGNOSIS — N183 Chronic kidney disease, stage 3 unspecified: Secondary | ICD-10-CM | POA: Diagnosis not present

## 2019-07-09 DIAGNOSIS — Z978 Presence of other specified devices: Secondary | ICD-10-CM | POA: Diagnosis not present

## 2019-07-09 DIAGNOSIS — J9601 Acute respiratory failure with hypoxia: Secondary | ICD-10-CM

## 2019-07-09 DIAGNOSIS — R0602 Shortness of breath: Secondary | ICD-10-CM

## 2019-07-09 DIAGNOSIS — I447 Left bundle-branch block, unspecified: Secondary | ICD-10-CM | POA: Diagnosis present

## 2019-07-09 DIAGNOSIS — R471 Dysarthria and anarthria: Secondary | ICD-10-CM | POA: Diagnosis present

## 2019-07-09 DIAGNOSIS — G9349 Other encephalopathy: Secondary | ICD-10-CM | POA: Diagnosis present

## 2019-07-09 DIAGNOSIS — T886XXA Anaphylactic reaction due to adverse effect of correct drug or medicament properly administered, initial encounter: Secondary | ICD-10-CM | POA: Diagnosis not present

## 2019-07-09 DIAGNOSIS — R531 Weakness: Secondary | ICD-10-CM | POA: Diagnosis not present

## 2019-07-09 DIAGNOSIS — I6601 Occlusion and stenosis of right middle cerebral artery: Secondary | ICD-10-CM | POA: Diagnosis not present

## 2019-07-09 DIAGNOSIS — I4891 Unspecified atrial fibrillation: Secondary | ICD-10-CM

## 2019-07-09 DIAGNOSIS — I629 Nontraumatic intracranial hemorrhage, unspecified: Secondary | ICD-10-CM | POA: Diagnosis not present

## 2019-07-09 DIAGNOSIS — I615 Nontraumatic intracerebral hemorrhage, intraventricular: Secondary | ICD-10-CM | POA: Diagnosis not present

## 2019-07-09 DIAGNOSIS — R296 Repeated falls: Secondary | ICD-10-CM | POA: Diagnosis present

## 2019-07-09 DIAGNOSIS — I130 Hypertensive heart and chronic kidney disease with heart failure and stage 1 through stage 4 chronic kidney disease, or unspecified chronic kidney disease: Secondary | ICD-10-CM | POA: Diagnosis present

## 2019-07-09 DIAGNOSIS — I472 Ventricular tachycardia: Secondary | ICD-10-CM | POA: Diagnosis not present

## 2019-07-09 DIAGNOSIS — R4781 Slurred speech: Secondary | ICD-10-CM | POA: Diagnosis present

## 2019-07-09 DIAGNOSIS — K9423 Gastrostomy malfunction: Secondary | ICD-10-CM | POA: Diagnosis not present

## 2019-07-09 DIAGNOSIS — Z4682 Encounter for fitting and adjustment of non-vascular catheter: Secondary | ICD-10-CM | POA: Diagnosis not present

## 2019-07-09 DIAGNOSIS — I609 Nontraumatic subarachnoid hemorrhage, unspecified: Secondary | ICD-10-CM | POA: Diagnosis not present

## 2019-07-09 DIAGNOSIS — I5021 Acute systolic (congestive) heart failure: Secondary | ICD-10-CM | POA: Diagnosis not present

## 2019-07-09 DIAGNOSIS — W19XXXA Unspecified fall, initial encounter: Secondary | ICD-10-CM | POA: Diagnosis not present

## 2019-07-09 DIAGNOSIS — R1311 Dysphagia, oral phase: Secondary | ICD-10-CM | POA: Diagnosis not present

## 2019-07-09 DIAGNOSIS — I255 Ischemic cardiomyopathy: Secondary | ICD-10-CM | POA: Diagnosis not present

## 2019-07-09 DIAGNOSIS — R140 Abdominal distension (gaseous): Secondary | ICD-10-CM | POA: Diagnosis not present

## 2019-07-09 DIAGNOSIS — R9431 Abnormal electrocardiogram [ECG] [EKG]: Secondary | ICD-10-CM

## 2019-07-09 DIAGNOSIS — I509 Heart failure, unspecified: Secondary | ICD-10-CM

## 2019-07-09 DIAGNOSIS — I420 Dilated cardiomyopathy: Secondary | ICD-10-CM | POA: Diagnosis present

## 2019-07-09 DIAGNOSIS — R001 Bradycardia, unspecified: Secondary | ICD-10-CM | POA: Diagnosis not present

## 2019-07-09 DIAGNOSIS — E7849 Other hyperlipidemia: Secondary | ICD-10-CM | POA: Diagnosis not present

## 2019-07-09 DIAGNOSIS — R197 Diarrhea, unspecified: Secondary | ICD-10-CM | POA: Diagnosis not present

## 2019-07-09 DIAGNOSIS — I639 Cerebral infarction, unspecified: Secondary | ICD-10-CM | POA: Diagnosis not present

## 2019-07-09 DIAGNOSIS — E785 Hyperlipidemia, unspecified: Secondary | ICD-10-CM | POA: Diagnosis present

## 2019-07-09 DIAGNOSIS — I5022 Chronic systolic (congestive) heart failure: Secondary | ICD-10-CM | POA: Diagnosis present

## 2019-07-09 DIAGNOSIS — R6521 Severe sepsis with septic shock: Secondary | ICD-10-CM | POA: Diagnosis not present

## 2019-07-09 DIAGNOSIS — I10 Essential (primary) hypertension: Secondary | ICD-10-CM | POA: Diagnosis present

## 2019-07-09 DIAGNOSIS — I480 Paroxysmal atrial fibrillation: Secondary | ICD-10-CM | POA: Diagnosis present

## 2019-07-09 DIAGNOSIS — K921 Melena: Secondary | ICD-10-CM | POA: Diagnosis not present

## 2019-07-09 DIAGNOSIS — Z0189 Encounter for other specified special examinations: Secondary | ICD-10-CM

## 2019-07-09 DIAGNOSIS — R112 Nausea with vomiting, unspecified: Secondary | ICD-10-CM | POA: Diagnosis not present

## 2019-07-09 DIAGNOSIS — R54 Age-related physical debility: Secondary | ICD-10-CM | POA: Diagnosis present

## 2019-07-09 DIAGNOSIS — R110 Nausea: Secondary | ICD-10-CM

## 2019-07-09 DIAGNOSIS — R410 Disorientation, unspecified: Secondary | ICD-10-CM | POA: Diagnosis not present

## 2019-07-09 DIAGNOSIS — E876 Hypokalemia: Secondary | ICD-10-CM | POA: Diagnosis present

## 2019-07-09 DIAGNOSIS — R29709 NIHSS score 9: Secondary | ICD-10-CM | POA: Diagnosis present

## 2019-07-09 DIAGNOSIS — I6523 Occlusion and stenosis of bilateral carotid arteries: Secondary | ICD-10-CM | POA: Diagnosis not present

## 2019-07-09 DIAGNOSIS — I454 Nonspecific intraventricular block: Secondary | ICD-10-CM

## 2019-07-09 DIAGNOSIS — I2510 Atherosclerotic heart disease of native coronary artery without angina pectoris: Secondary | ICD-10-CM | POA: Diagnosis present

## 2019-07-09 DIAGNOSIS — D62 Acute posthemorrhagic anemia: Secondary | ICD-10-CM | POA: Diagnosis not present

## 2019-07-09 DIAGNOSIS — I6521 Occlusion and stenosis of right carotid artery: Secondary | ICD-10-CM | POA: Diagnosis present

## 2019-07-09 DIAGNOSIS — A419 Sepsis, unspecified organism: Secondary | ICD-10-CM | POA: Diagnosis not present

## 2019-07-09 DIAGNOSIS — R918 Other nonspecific abnormal finding of lung field: Secondary | ICD-10-CM | POA: Diagnosis not present

## 2019-07-09 DIAGNOSIS — K56609 Unspecified intestinal obstruction, unspecified as to partial versus complete obstruction: Secondary | ICD-10-CM | POA: Diagnosis not present

## 2019-07-09 DIAGNOSIS — J690 Pneumonitis due to inhalation of food and vomit: Secondary | ICD-10-CM | POA: Diagnosis present

## 2019-07-09 DIAGNOSIS — J398 Other specified diseases of upper respiratory tract: Secondary | ICD-10-CM

## 2019-07-09 DIAGNOSIS — E46 Unspecified protein-calorie malnutrition: Secondary | ICD-10-CM | POA: Diagnosis present

## 2019-07-09 DIAGNOSIS — R1115 Cyclical vomiting syndrome unrelated to migraine: Secondary | ICD-10-CM | POA: Diagnosis not present

## 2019-07-09 DIAGNOSIS — K5750 Diverticulosis of both small and large intestine without perforation or abscess without bleeding: Secondary | ICD-10-CM | POA: Diagnosis present

## 2019-07-09 DIAGNOSIS — I6529 Occlusion and stenosis of unspecified carotid artery: Secondary | ICD-10-CM

## 2019-07-09 DIAGNOSIS — R000 Tachycardia, unspecified: Secondary | ICD-10-CM | POA: Diagnosis not present

## 2019-07-09 DIAGNOSIS — N138 Other obstructive and reflux uropathy: Secondary | ICD-10-CM | POA: Diagnosis not present

## 2019-07-09 DIAGNOSIS — L89301 Pressure ulcer of unspecified buttock, stage 1: Secondary | ICD-10-CM | POA: Diagnosis not present

## 2019-07-09 DIAGNOSIS — M419 Scoliosis, unspecified: Secondary | ICD-10-CM | POA: Diagnosis present

## 2019-07-09 DIAGNOSIS — G8194 Hemiplegia, unspecified affecting left nondominant side: Secondary | ICD-10-CM | POA: Diagnosis present

## 2019-07-09 DIAGNOSIS — K219 Gastro-esophageal reflux disease without esophagitis: Secondary | ICD-10-CM | POA: Diagnosis present

## 2019-07-09 DIAGNOSIS — E7800 Pure hypercholesterolemia, unspecified: Secondary | ICD-10-CM | POA: Diagnosis not present

## 2019-07-09 DIAGNOSIS — Z8546 Personal history of malignant neoplasm of prostate: Secondary | ICD-10-CM

## 2019-07-09 DIAGNOSIS — I517 Cardiomegaly: Secondary | ICD-10-CM | POA: Diagnosis not present

## 2019-07-09 DIAGNOSIS — Z8249 Family history of ischemic heart disease and other diseases of the circulatory system: Secondary | ICD-10-CM

## 2019-07-09 DIAGNOSIS — Z01818 Encounter for other preprocedural examination: Secondary | ICD-10-CM | POA: Diagnosis not present

## 2019-07-09 DIAGNOSIS — K5730 Diverticulosis of large intestine without perforation or abscess without bleeding: Secondary | ICD-10-CM | POA: Diagnosis not present

## 2019-07-09 DIAGNOSIS — Z1389 Encounter for screening for other disorder: Secondary | ICD-10-CM

## 2019-07-09 DIAGNOSIS — R1110 Vomiting, unspecified: Secondary | ICD-10-CM

## 2019-07-09 DIAGNOSIS — N1831 Chronic kidney disease, stage 3a: Secondary | ICD-10-CM | POA: Diagnosis not present

## 2019-07-09 DIAGNOSIS — J9811 Atelectasis: Secondary | ICD-10-CM | POA: Diagnosis not present

## 2019-07-09 DIAGNOSIS — J95821 Acute postprocedural respiratory failure: Secondary | ICD-10-CM | POA: Diagnosis not present

## 2019-07-09 DIAGNOSIS — R079 Chest pain, unspecified: Secondary | ICD-10-CM | POA: Diagnosis not present

## 2019-07-09 DIAGNOSIS — N179 Acute kidney failure, unspecified: Secondary | ICD-10-CM | POA: Diagnosis not present

## 2019-07-09 DIAGNOSIS — N401 Enlarged prostate with lower urinary tract symptoms: Secondary | ICD-10-CM | POA: Diagnosis present

## 2019-07-09 DIAGNOSIS — Z789 Other specified health status: Secondary | ICD-10-CM

## 2019-07-09 DIAGNOSIS — I63511 Cerebral infarction due to unspecified occlusion or stenosis of right middle cerebral artery: Principal | ICD-10-CM | POA: Diagnosis present

## 2019-07-09 DIAGNOSIS — Z7901 Long term (current) use of anticoagulants: Secondary | ICD-10-CM

## 2019-07-09 DIAGNOSIS — R29810 Facial weakness: Secondary | ICD-10-CM | POA: Diagnosis not present

## 2019-07-09 DIAGNOSIS — G4700 Insomnia, unspecified: Secondary | ICD-10-CM | POA: Diagnosis not present

## 2019-07-09 DIAGNOSIS — I169 Hypertensive crisis, unspecified: Secondary | ICD-10-CM | POA: Diagnosis not present

## 2019-07-09 DIAGNOSIS — T4275XA Adverse effect of unspecified antiepileptic and sedative-hypnotic drugs, initial encounter: Secondary | ICD-10-CM | POA: Diagnosis not present

## 2019-07-09 DIAGNOSIS — K909 Intestinal malabsorption, unspecified: Secondary | ICD-10-CM | POA: Diagnosis not present

## 2019-07-09 DIAGNOSIS — R451 Restlessness and agitation: Secondary | ICD-10-CM

## 2019-07-09 DIAGNOSIS — K567 Ileus, unspecified: Secondary | ICD-10-CM | POA: Diagnosis not present

## 2019-07-09 DIAGNOSIS — Z8711 Personal history of peptic ulcer disease: Secondary | ICD-10-CM

## 2019-07-09 HISTORY — PX: RADIOLOGY WITH ANESTHESIA: SHX6223

## 2019-07-09 LAB — CBC
HCT: 45.8 % (ref 39.0–52.0)
Hemoglobin: 15 g/dL (ref 13.0–17.0)
MCH: 31.4 pg (ref 26.0–34.0)
MCHC: 32.8 g/dL (ref 30.0–36.0)
MCV: 95.8 fL (ref 80.0–100.0)
Platelets: 264 10*3/uL (ref 150–400)
RBC: 4.78 MIL/uL (ref 4.22–5.81)
RDW: 13.4 % (ref 11.5–15.5)
WBC: 8.1 10*3/uL (ref 4.0–10.5)
nRBC: 0 % (ref 0.0–0.2)

## 2019-07-09 LAB — DIFFERENTIAL
Abs Immature Granulocytes: 0.05 10*3/uL (ref 0.00–0.07)
Basophils Absolute: 0 10*3/uL (ref 0.0–0.1)
Basophils Relative: 1 %
Eosinophils Absolute: 0.1 10*3/uL (ref 0.0–0.5)
Eosinophils Relative: 1 %
Immature Granulocytes: 1 %
Lymphocytes Relative: 20 %
Lymphs Abs: 1.6 10*3/uL (ref 0.7–4.0)
Monocytes Absolute: 0.9 10*3/uL (ref 0.1–1.0)
Monocytes Relative: 11 %
Neutro Abs: 5.4 10*3/uL (ref 1.7–7.7)
Neutrophils Relative %: 66 %

## 2019-07-09 LAB — COMPREHENSIVE METABOLIC PANEL
ALT: 14 U/L (ref 0–44)
AST: 19 U/L (ref 15–41)
Albumin: 3.9 g/dL (ref 3.5–5.0)
Alkaline Phosphatase: 102 U/L (ref 38–126)
Anion gap: 14 (ref 5–15)
BUN: 29 mg/dL — ABNORMAL HIGH (ref 8–23)
CO2: 20 mmol/L — ABNORMAL LOW (ref 22–32)
Calcium: 9.9 mg/dL (ref 8.9–10.3)
Chloride: 109 mmol/L (ref 98–111)
Creatinine, Ser: 1.71 mg/dL — ABNORMAL HIGH (ref 0.61–1.24)
GFR calc Af Amer: 43 mL/min — ABNORMAL LOW (ref >60)
GFR calc non Af Amer: 38 mL/min — ABNORMAL LOW (ref >60)
Glucose, Bld: 129 mg/dL — ABNORMAL HIGH (ref 70–99)
Potassium: 3.8 mmol/L (ref 3.5–5.1)
Sodium: 143 mmol/L (ref 135–145)
Total Bilirubin: 1.3 mg/dL — ABNORMAL HIGH (ref 0.3–1.2)
Total Protein: 7.3 g/dL (ref 6.5–8.1)

## 2019-07-09 LAB — I-STAT CHEM 8, ED
BUN: 30 mg/dL — ABNORMAL HIGH (ref 8–23)
Calcium, Ion: 1.23 mmol/L (ref 1.15–1.40)
Chloride: 110 mmol/L (ref 98–111)
Creatinine, Ser: 1.6 mg/dL — ABNORMAL HIGH (ref 0.61–1.24)
Glucose, Bld: 123 mg/dL — ABNORMAL HIGH (ref 70–99)
HCT: 44 % (ref 39.0–52.0)
Hemoglobin: 15 g/dL (ref 13.0–17.0)
Potassium: 3.7 mmol/L (ref 3.5–5.1)
Sodium: 145 mmol/L (ref 135–145)
TCO2: 23 mmol/L (ref 22–32)

## 2019-07-09 LAB — CBG MONITORING, ED: Glucose-Capillary: 115 mg/dL — ABNORMAL HIGH (ref 70–99)

## 2019-07-09 LAB — RESPIRATORY PANEL BY RT PCR (FLU A&B, COVID)
Influenza A by PCR: NEGATIVE
Influenza B by PCR: NEGATIVE
SARS Coronavirus 2 by RT PCR: NEGATIVE

## 2019-07-09 LAB — PROTIME-INR
INR: 1.4 — ABNORMAL HIGH (ref 0.8–1.2)
Prothrombin Time: 16.5 seconds — ABNORMAL HIGH (ref 11.4–15.2)

## 2019-07-09 LAB — APTT: aPTT: 38 seconds — ABNORMAL HIGH (ref 24–36)

## 2019-07-09 SURGERY — IR WITH ANESTHESIA
Anesthesia: General

## 2019-07-09 MED ORDER — ACETAMINOPHEN 160 MG/5ML PO SOLN: 650.0000 mg | ORAL | Status: DC | PRN

## 2019-07-09 MED ORDER — SENNOSIDES-DOCUSATE SODIUM 8.6-50 MG PO TABS: 1.0000 | ORAL_TABLET | Freq: Every evening | ORAL | Status: DC | PRN

## 2019-07-09 MED ORDER — NITROGLYCERIN 1 MG/10 ML FOR IR/CATH LAB
INTRA_ARTERIAL | Status: AC
  Filled 2019-07-09: qty 10

## 2019-07-09 MED ORDER — ASPIRIN EC 81 MG PO TBEC: 81.0000 mg | DELAYED_RELEASE_TABLET | Freq: Every day | ORAL | Status: DC

## 2019-07-09 MED ORDER — FENTANYL CITRATE (PF) 250 MCG/5ML IJ SOLN
INTRAMUSCULAR | Status: DC | PRN
  Administered 2019-07-09 – 2019-07-10 (×5): 50 ug via INTRAVENOUS

## 2019-07-09 MED ORDER — CEFAZOLIN SODIUM-DEXTROSE 2-4 GM/100ML-% IV SOLN
INTRAVENOUS | Status: AC
  Filled 2019-07-09: qty 100

## 2019-07-09 MED ORDER — STROKE: EARLY STAGES OF RECOVERY BOOK: Freq: Once | Status: DC

## 2019-07-09 MED ORDER — ACETAMINOPHEN 650 MG RE SUPP: 650.0000 mg | RECTAL | Status: DC | PRN

## 2019-07-09 MED ORDER — PHENYLEPHRINE HCL-NACL 10-0.9 MG/250ML-% IV SOLN
INTRAVENOUS | Status: DC | PRN
  Administered 2019-07-09: 25 ug/min via INTRAVENOUS

## 2019-07-09 MED ORDER — CEFAZOLIN SODIUM-DEXTROSE 2-3 GM-%(50ML) IV SOLR
INTRAVENOUS | Status: DC | PRN
  Administered 2019-07-09: 2 g via INTRAVENOUS

## 2019-07-09 MED ORDER — TICAGRELOR 90 MG PO TABS: 180.0000 mg | ORAL_TABLET | Freq: Once | ORAL | Status: AC

## 2019-07-09 MED ORDER — SODIUM CHLORIDE 0.9 % IV SOLN: INTRAVENOUS | Status: DC

## 2019-07-09 MED ORDER — ROCURONIUM 10MG/ML (10ML) SYRINGE FOR MEDFUSION PUMP - OPTIME
INTRAVENOUS | Status: DC | PRN
  Administered 2019-07-09: 20 mg via INTRAVENOUS
  Administered 2019-07-09: 50 mg via INTRAVENOUS

## 2019-07-09 MED ORDER — EPTIFIBATIDE 20 MG/10ML IV SOLN
INTRAVENOUS | Status: AC
  Filled 2019-07-09: qty 10

## 2019-07-09 MED ORDER — MIDAZOLAM HCL 2 MG/2ML IJ SOLN
INTRAMUSCULAR | Status: AC
  Filled 2019-07-09: qty 2

## 2019-07-09 MED ORDER — EPTIFIBATIDE 20 MG/10ML IV SOLN
INTRAVENOUS | Status: AC | PRN
  Administered 2019-07-09: 1.5 mg

## 2019-07-09 MED ORDER — PHENYLEPHRINE HCL (PRESSORS) 10 MG/ML IV SOLN
INTRAVENOUS | Status: DC | PRN
  Administered 2019-07-09 (×3): 40 ug via INTRAVENOUS

## 2019-07-09 MED ORDER — ACETAMINOPHEN 325 MG PO TABS: 650.0000 mg | ORAL_TABLET | ORAL | Status: DC | PRN

## 2019-07-09 MED ORDER — PROPOFOL 10 MG/ML IV BOLUS
INTRAVENOUS | Status: DC | PRN
  Administered 2019-07-09: 100 mg via INTRAVENOUS
  Administered 2019-07-10 (×2): 50 mg via INTRAVENOUS

## 2019-07-09 MED ORDER — VERAPAMIL HCL 2.5 MG/ML IV SOLN
INTRAVENOUS | Status: AC
  Filled 2019-07-09: qty 2

## 2019-07-09 MED ORDER — FENTANYL CITRATE (PF) 250 MCG/5ML IJ SOLN
INTRAMUSCULAR | Status: AC
  Filled 2019-07-09: qty 5

## 2019-07-09 MED ORDER — TIROFIBAN HCL IN NACL 5-0.9 MG/100ML-% IV SOLN
INTRAVENOUS | Status: AC
  Filled 2019-07-09: qty 100

## 2019-07-09 MED ORDER — SODIUM CHLORIDE 0.9 % IV SOLN: INTRAVENOUS | Status: DC | PRN

## 2019-07-09 MED ORDER — ASPIRIN 81 MG PO CHEW
CHEWABLE_TABLET | ORAL | Status: AC
  Filled 2019-07-09: qty 1

## 2019-07-09 MED ORDER — IOHEXOL 350 MG/ML SOLN
100.0000 mL | Freq: Once | INTRAVENOUS | Status: AC | PRN
  Administered 2019-07-09: 100 mL via INTRAVENOUS

## 2019-07-09 MED ORDER — EPTIFIBATIDE 20 MG/10ML IV SOLN
INTRAVENOUS | Status: AC | PRN
  Administered 2019-07-09: 3 mg
  Administered 2019-07-09: 3 ug
  Administered 2019-07-09: 3 mg

## 2019-07-09 MED ORDER — CLOPIDOGREL BISULFATE 300 MG PO TABS
ORAL_TABLET | ORAL | Status: AC
  Filled 2019-07-09: qty 1

## 2019-07-09 MED ORDER — TICAGRELOR 90 MG PO TABS
ORAL_TABLET | ORAL | Status: AC
  Administered 2019-07-09: 180 mg via ORAL
  Filled 2019-07-09: qty 2

## 2019-07-09 MED ORDER — NITROGLYCERIN 1 MG/10 ML FOR IR/CATH LAB
INTRA_ARTERIAL | Status: AC | PRN
  Administered 2019-07-09 (×3): 25 ug via INTRA_ARTERIAL

## 2019-07-09 MED ORDER — VERAPAMIL HCL 2.5 MG/ML IV SOLN
INTRAVENOUS | Status: AC | PRN
  Administered 2019-07-09 (×2): 2.5 mg via INTRA_ARTERIAL

## 2019-07-09 MED ORDER — SUCCINYLCHOLINE 20MG/ML (10ML) SYRINGE FOR MEDFUSION PUMP - OPTIME
INTRAMUSCULAR | Status: DC | PRN
  Administered 2019-07-09: 120 mg via INTRAVENOUS

## 2019-07-09 NOTE — Anesthesia Procedure Notes (Signed)
Procedure Name: Intubation Date/Time: 07/09/2019 9:39 PM Performed by: Valetta Fuller, CRNA Pre-anesthesia Checklist: Patient identified, Emergency Drugs available, Suction available and Patient being monitored Patient Re-evaluated:Patient Re-evaluated prior to induction Oxygen Delivery Method: Ambu bag Preoxygenation: Pre-oxygenation with 100% oxygen Induction Type: IV induction, Rapid sequence and Cricoid Pressure applied Laryngoscope Size: Glidescope and 4 Grade View: Grade I Tube type: Oral Tube size: 7.5 mm Number of attempts: 1 Airway Equipment and Method: Stylet Placement Confirmation: ETT inserted through vocal cords under direct vision,  positive ETCO2 and breath sounds checked- equal and bilateral Secured at: 23 cm Tube secured with: Tape Dental Injury: Teeth and Oropharynx as per pre-operative assessment

## 2019-07-09 NOTE — ED Triage Notes (Signed)
Pt BIB [facility] from home for slurred speech, L facial droop, L weakness, LKN 0100 this morning. Pt fell x3, wife called EMS for civil assist, denies hitting head. Pt A&O, NAD noted. Hx Afib w/ RVR, takes Eliquis.

## 2019-07-09 NOTE — Anesthesia Procedure Notes (Signed)
Arterial Line Insertion Start/End5/08/2019 10:00 PM, 07/09/2019 10:05 PM Performed by: Valetta Fuller, CRNA, CRNA  Patient location: OOR procedure area. Left, radial was placed Catheter size: 20 G Hand hygiene performed  and maximum sterile barriers used   Attempts: 1 Procedure performed without using ultrasound guided technique. Following insertion, dressing applied and Biopatch. Post procedure assessment: normal

## 2019-07-09 NOTE — ED Provider Notes (Signed)
[facility] Provider Note   CSN: [id number] Arrival date & time: 07/09/19  2018     History Chief Complaint  Patient presents with  . Code Stroke    Stephen Copeland is a 79 y.o. male.  Last known normal 1 am (18-20 hours ago). Patient with afib on eloquis. Patient with multiple falls. Left facial droop and left arm weakness since 1 am.   The history is provided by the patient.  Neurologic Problem This is a new problem. The current episode started 12 to 24 hours ago. The problem occurs constantly. The problem has not changed since onset.Pertinent negatives include no chest pain, no abdominal pain, no headaches and no shortness of breath. Nothing aggravates the symptoms. Nothing relieves the symptoms. He has tried nothing for the symptoms. The treatment provided no relief.       Past Medical History:  Diagnosis Date  . Acid reflux   . CHF (congestive heart failure) ([place])   . Coronary artery disease   . Dilated cardiomyopathy ([place])   . Folliculitis   . Gastric ulcer   . Hiatal hernia   . Hyperlipidemia    a. pt is adamantly against statins.  . Hypertension   . Ischemia    a. Pt states he was diagnosed with "ischemia" in the 1990s but does not know further details, denies hx of heart blockage.  . Nummular dermatitis   . Prostate cancer ([place])   . Renal disorder   . Scoliosis   . Sinus bradycardia   . Stomach ulcer    a. remote ulcer in the 1990s, no hx of bleeding.    Patient Active Problem List   Diagnosis Date Noted  . Vasovagal syncope 07/18/2017  . S/P TURP 07/18/2017  . MRSA bacteremia 07/07/2017  . Pseudomonas sepsis ([place]) 07/07/2017  . Pseudomonas urinary tract infection 07/07/2017  . Coronary artery disease 07/07/2017  . 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 Encounter for therapeutic drug monitoring 04/09/2017  . Right lower lobe pulmonary nodule 12/19/2016  . Noncompliance with treatment plan  12/19/2016  . Bacteremia due to Enterococcus 12/19/2016  . Enterococcus UTI 12/19/2016  . Acute renal failure (ARF) ([place]) 12/18/2016  . Hyperkalemia 12/18/2016  . Hyperbilirubinemia 12/18/2016  . UTI (urinary tract infection) 05/31/2016  . Acute urinary retention 05/31/2016  . Acute kidney injury superimposed on chronic kidney disease ([place]) 05/31/2016  . Generalized weakness 05/31/2016  . Falls 05/31/2016  . Acute systolic CHF (congestive heart failure) ([place])   . Congestive dilated cardiomyopathy ([place])   . Atrial fibrillation with RVR ([place])   . Uncontrolled hypertension   . Pulmonary hypertension ([place])   . Chronic systolic CHF (congestive heart failure) ([place])   . Paroxysmal atrial fibrillation ([place]) 12/19/2015  . Hypertension 12/19/2015  . New onset a-fib ([place]) 12/19/2015  . Acute CHF ([place]) 12/19/2015  . Hyperglycemia 12/19/2015  . Acid reflux   . Hyperlipidemia   . Sinus bradycardia   . Gastroesophageal reflux disease   . Other hyperlipidemia   . Elevated troponin     Past Surgical History:  Procedure Laterality Date  . INGUINAL HERNIA REPAIR Left        Family History  Problem Relation Age of Onset  . Heart disease Mother        Further details not reported, died at age 58  . Valvular heart disease Father        H/o MV surgery, diet at age 23  Social History   Tobacco Use  . Smoking status: Never Smoker  . Smokeless tobacco: Never Used  Substance Use Topics  . Alcohol use: No  . Drug use: No    Home Medications Prior to Admission medications   Medication Sig Start Date End Date Taking? Authorizing Provider  apixaban (ELIQUIS) 5 MG TABS tablet Take 1 tablet (5 mg total) by mouth 2 (two) times daily. 02/17/19   Burnell Blanks, MD  hydrALAZINE (APRESOLINE) 50 MG tablet TAKE 1 TABLET THREE TIMES A DAY (NEED FEBRUARY APPOINTMENT FOR FURTHER REFILLS THANKS ) 07/06/19   Burnell Blanks, MD  hydrocortisone cream 1 % Apply 1 application topically  2 (two) times daily.    [provider]  lidocaine (XYLOCAINE) 5 % ointment Apply 1 application topically as needed.    [provider]  metoprolol tartrate (LOPRESSOR) 25 MG tablet TAKE 1 TABLET TWICE A DAY 07/08/19   Burnell Blanks, MD  nitroGLYCERIN (NITROSTAT) 0.4 MG SL tablet Place under the tongue every 5 (five) minutes as needed for chest pain.  02/20/16   [provider]  omeprazole (PRILOSEC) 20 MG capsule Take 1 capsule (20 mg total) by mouth daily. 10/24/17   Burnell Blanks, MD  tamsulosin (FLOMAX) 0.4 MG CAPS capsule Take 0.4 mg by mouth daily after supper.  03/14/17   [provider]    Allergies    Patient has no known allergies.  Review of Systems   Review of Systems  Constitutional: Negative for chills and fever.  HENT: Negative for ear pain and sore throat.   Eyes: Negative for pain and visual disturbance.  Respiratory: Negative for cough and shortness of breath.   Cardiovascular: Negative for chest pain and palpitations.  Gastrointestinal: Negative for abdominal pain and vomiting.  Genitourinary: Negative for dysuria and hematuria.  Musculoskeletal: Negative for arthralgias and back pain.  Skin: Negative for color change and rash.  Neurological: Positive for speech difficulty (slurred speech), weakness and numbness. Negative for seizures, syncope and headaches.  All other systems reviewed and are negative.   Physical Exam Updated Vital Signs  ED Triage Vitals [07/09/19 2031]  Enc Vitals Group     BP 133/81     Pulse Rate 66     Resp (!) 21     Temp 97.9 F (36.6 C)     Temp Source Oral     SpO2 94 %     Weight      Height      Head Circumference      Peak Flow      Pain Score      Pain Loc      Pain Edu?      Excl. in [place]?     Physical Exam Vitals and nursing note reviewed.  Constitutional:      General: He is not in acute distress.    Appearance: He is well-developed. He is not ill-appearing.    HENT:     Head: Normocephalic and atraumatic.     Nose: Nose normal.     Mouth/Throat:     Mouth: Mucous membranes are moist.  Eyes:     Extraocular Movements: Extraocular movements intact.     Conjunctiva/sclera: Conjunctivae normal.     Pupils: Pupils are equal, round, and reactive to light.  Cardiovascular:     Rate and Rhythm: Normal rate and regular rhythm.     Pulses: Normal pulses.     Heart sounds: Normal heart sounds. No  murmur.  Pulmonary:     Effort: Pulmonary effort is normal. No respiratory distress.     Breath sounds: Normal breath sounds.  Abdominal:     Palpations: Abdomen is soft.     Tenderness: There is no abdominal tenderness.  Musculoskeletal:     Cervical back: Normal range of motion and neck supple.  Skin:    General: Skin is warm and dry.  Neurological:     Mental Status: He is alert.     Sensory: Sensory deficit present.     Motor: Weakness present.     Comments: Left sided weakness in arm (1+/5, slightly spastic) and leg (4+/5), 5+/5 strength RUE/RLE, decreased sensation on left side with likely left sided neglect, left facial droop and slurred speech       ED Results / Procedures / Treatments   Labs (all labs ordered are listed, but only abnormal results are displayed) Labs Reviewed  PROTIME-INR - Abnormal; Notable for the following components:      Result Value   Prothrombin Time 16.5 (*)    INR 1.4 (*)    All other components within normal limits  APTT - Abnormal; Notable for the following components:   aPTT 38 (*)    All other components within normal limits  COMPREHENSIVE METABOLIC PANEL - Abnormal; Notable for the following components:   CO2 20 (*)    Glucose, Bld 129 (*)    BUN 29 (*)    Creatinine, Ser 1.71 (*)    Total Bilirubin 1.3 (*)    GFR calc non Af Amer 38 (*)    GFR calc Af Amer 43 (*)    All other components within normal limits  CBG MONITORING, ED - Abnormal; Notable for the following components:   Glucose-Capillary  115 (*)    All other components within normal limits  I-STAT CHEM 8, ED - Abnormal; Notable for the following components:   BUN 30 (*)    Creatinine, Ser 1.60 (*)    Glucose, Bld 123 (*)    All other components within normal limits  RESPIRATORY PANEL BY RT PCR (FLU A&B, COVID)  CBC  DIFFERENTIAL  ETHANOL  RAPID URINE DRUG SCREEN, HOSP PERFORMED  URINALYSIS, ROUTINE W REFLEX MICROSCOPIC    EKG EKG Interpretation  Date/Time:  Thursday Jul 09 2019 20:26:29 EDT Ventricular Rate:  134 PR Interval:    QRS Duration: 137 QT Interval:  391 QTC Calculation: 584 R Axis:   -75 Text Interpretation: Atrial fibrillation w/ rvr Multiple ventricular premature complexes Left bundle branch block Baseline wander in lead(s) V3 Confirmed by Lennice Sites [phone]) on 07/09/2019 8:40:41 PM   Radiology No results found.  Procedures .Critical Care Performed by: Lennice Sites, DO Authorized by: Lennice Sites, DO   Critical care provider statement:    Critical care time (minutes):  40   Critical care was necessary to treat or prevent imminent or life-threatening deterioration of the following conditions:  CNS failure or compromise   Critical care was time spent personally by me on the following activities:  Blood draw for specimens, development of treatment plan with patient or surrogate, discussions with consultants, evaluation of patient's response to treatment, examination of patient, obtaining history from patient or surrogate, ordering and performing treatments and interventions, ordering and review of laboratory studies, ordering and review of radiographic studies, re-evaluation of patient's condition and pulse oximetry   I assumed direction of critical care for this patient from another provider in my specialty: no     (  including critical care time)  Medications Ordered in ED Medications  ticagrelor (BRILINTA) tablet 180 mg (has no administration in time range)  aspirin EC tablet 81 mg (has  no administration in time range)  iohexol (OMNIPAQUE) 350 MG/ML injection 100 mL (100 mLs Intravenous Contrast Given 07/09/19 2102)    ED Course  I have reviewed the triage vital signs and the nursing notes.  Pertinent labs & imaging results that were available during my care of the patient were reviewed by me and considered in my medical decision making (see chart for details).    MDM Rules/Calculators/A&P                      Stephen Copeland is a 79 year old male with history of atrial fibrillation on Eliquis who presents to the ED with strokelike symptoms.  Last known normal was 1 AM about 20 hours ago.  Patient was unable to get out of bed due to left-sided weakness.  Therefore patient presenting late.  Patient with obvious left-sided facial droop, left upper and left lower extremity weakness.  Slurred speech.  Mild dysarthria, no obvious visual field deficit.  Appears to have some left-sided neglect.  At that time code stroke was initiated given concern for LVO.  Neurology came to the bedside and patient went directly to CT scan.  CT scan was negative for hemorrhage.  CT perfusion study shows large vessel occlusion and patient went directly for interventional treatment.  Lab work was overall unremarkable.  No significant anemia, electrolyte abnormality, kidney injury.  Creatinine at baseline.  Blood pressure within normal limits.  EKG showed atrial fibrillation and RVR in the 130s.  Patient went to IR and to be admitted to the neurological ICU.  This chart was dictated using voice recognition software.  Despite best efforts to proofread,  errors can occur which can change the documentation meaning.    Final Clinical Impression(s) / ED Diagnoses Final diagnoses:  Cerebrovascular accident (CVA), unspecified mechanism ([place])  Atrial fibrillation with RVR [facility])    Rx / DC Orders ED Discharge Orders    None       Lennice Sites, DO 07/09/19 2131

## 2019-07-09 NOTE — Consult Note (Signed)
NAME:  Stephen Copeland, MRN:  [id number], DOB:  01/06/1941, LOS: 0 ADMISSION DATE:  07/09/2019, CONSULTATION DATE:  5/6 REFERRING MD:  Dr Rory Percy, CHIEF COMPLAINT:  CVA  Brief History   79 year old admitted with right sided CVA and remained intubated post neuro IR.   History of present illness   79 year old male with PMH as below, which is significant for AF on eliquis, CAD, and dilated cardiomyopathy. He was in his usual state of health when last seen at around 1 am on 5/6, and when he awoke later that morning he noticed some left sided weakness and slurring of speech. EMS was eventually called later that night and felt his symptoms were consistent with stoke. He was transported to [facility] ED as a code stroke and was evaluated by neurology urgently. Brain imaging consistent with right carotid bifurcation near occlusive stenosis/plaque rupture along with right M2 superior division occlusion. He was outside the TPA window and was deemed a favorable candidate for endovascular procedure. The patient remained intubated in the post-procedural setting and PCCM was consulted for ventilator and medical management.   Past Medical History    has a past medical history of Acid reflux, CHF (congestive heart failure) ([place]), Coronary artery disease, Dilated cardiomyopathy ([place]), Folliculitis, Gastric ulcer, Hiatal hernia, Hyperlipidemia, Hypertension, Ischemia, Nummular dermatitis, Prostate cancer ([place]), Renal disorder, Scoliosis, Sinus bradycardia, and Stomach ulcer.  Significant Hospital Events   5/6 admitted, to neuro IR, intubated post-op  Consults:  IR PCCM  Procedures:  Neuro IR 5/7: see Dr. Estanislado Pandy note. Rt ICA stend, thrombectomy Rt MCA  Significant Diagnostic Tests:  CT head 5/6 > Evolving acute ischemic right MCA territory infarct involving the right insula and overlying supra ganglionic right cerebral cortex. No associated hemorrhage. CTA head/neck 5/6 > Probable ruptured plaque at the right  carotid bifurcation with associated severe near occlusive stenosis. Markedly attenuated and diminutive flow distally to the right ICA terminus. Possible superimposed dissection may be present. Distal reconstitution of the right M1 segment via collateral flow across the circle-of-Willis but with distal embolic occlusion of a proximal right M2/M3 branch  Micro Data:  Flu/Covid PCR neg  Antimicrobials:    Interim history/subjective:    Objective   Blood pressure 133/81, pulse (!) 131, temperature 97.9 F (36.6 C), temperature source Oral, resp. rate 20, SpO2 93 %.       No intake or output data in the 24 hours ending 07/09/19 2333 There were no vitals filed for this visit.  Examination: General: Elderly male in NAD HENT: /AT, Pupil pinpoint. Soft collar in place.  Lungs: Clear Cardiovascular: RRR, no MRG Abdomen: Soft, non-tender, non-distended Extremities: No acute deformity Neuro: Sedated. Localizes in lower extremities. No response in uppers.   Resolved Hospital Problem list     Assessment & Plan:   Ischemic stroke: R ICA/ MCA. S/p Rt ICA stenting and Rt MCA thrombectomy 5/7 SAH post procedural. - Management per neurology - SBP goal 120-178mmHg  - Clevidipine/Phenylephrine PRN for BP goal - Repeat CT per neuro.  - SSI for blood glucose goal 140-180  Acute hypoxemic respiratory failure Possible aspiration - Check post intubation CXR - Full vent support - WUA/SBT in AM - VAP bundle  - Hold ABX for now  Atrial fibrillation with RVR: AF history on eliquis. - Telemetry monitoring - Eliquis on hold - If remains AF will need heparin infusion when cleared by neurology - Consider BB if BP will tolerate - Echo pending  Acute encephalopathy:  secondary to stroke - Neuro checks - Propofol infusion for RASS goal 0 to -1  Chronic HFrEF: Dilated cardiomyopathy. LVEF 30-35% as of 62694 - Echo pending  - Telemetry  Best practice:  Diet: NPO Pain/Anxiety/Delirium  protocol (if indicated): propofol for rass goal VAP protocol (if indicated): yes DVT prophylaxis: SCD GI prophylaxis: Protonix Glucose control: NA Mobility: BR Code Status: FULL Family Communication: Unable to contact wife Disposition: ICU  Labs   CBC: Recent Labs  Lab 07/09/19 2031 07/09/19 2054  WBC 8.1  --   NEUTROABS 5.4  --   HGB 15.0 15.0  HCT 45.8 44.0  MCV 95.8  --   PLT 264  --     Basic Metabolic Panel: Recent Labs  Lab 07/09/19 2031 07/09/19 2054  NA 143 145  K 3.8 3.7  CL 109 110  CO2 20*  --   GLUCOSE 129* 123*  BUN 29* 30*  CREATININE 1.71* 1.60*  CALCIUM 9.9  --    GFR: CrCl cannot be calculated (Unknown ideal weight.). Recent Labs  Lab 07/09/19 2031  WBC 8.1    Liver Function Tests: Recent Labs  Lab 07/09/19 2031  AST 19  ALT 14  ALKPHOS 102  BILITOT 1.3*  PROT 7.3  ALBUMIN 3.9   No results for input(s): LIPASE, AMYLASE in the last 168 hours. No results for input(s): AMMONIA in the last 168 hours.  ABG    Component Value Date/Time   TCO2 23 07/09/2019 2054     Coagulation Profile: Recent Labs  Lab 07/09/19 2031  INR 1.4*    Cardiac Enzymes: No results for input(s): CKTOTAL, CKMB, CKMBINDEX, TROPONINI in the last 168 hours.  HbA1C: Hgb A1c MFr Bld  Date/Time Value Ref Range Status  05/31/2016 12:15 AM 5.7 (H) 4.8 - 5.6 % Final    Comment:    (NOTE)         Pre-diabetes: 5.7 - 6.4         Diabetes: >6.4         Glycemic control for adults with diabetes: <7.0   12/19/2015 04:00 PM 5.9 (H) 4.8 - 5.6 % Final    Comment:    (NOTE)         Pre-diabetes: 5.7 - 6.4         Diabetes: >6.4         Glycemic control for adults with diabetes: <7.0     CBG: Recent Labs  Lab 07/09/19 2029  GLUCAP 115*    Review of Systems:   Patient is encephalopathic and/or intubated. Therefore history has been obtained from chart review.  Past Medical History  He,  has a past medical history of Acid reflux, CHF (congestive  heart failure) ([place]), Coronary artery disease, Dilated cardiomyopathy ([place]), Folliculitis, Gastric ulcer, Hiatal hernia, Hyperlipidemia, Hypertension, Ischemia, Nummular dermatitis, Prostate cancer ([place]), Renal disorder, Scoliosis, Sinus bradycardia, and Stomach ulcer.   Surgical History    Past Surgical History:  Procedure Laterality Date  . INGUINAL HERNIA REPAIR Left      Social History   reports that he has never smoked. He has never used smokeless tobacco. He reports that he does not drink alcohol or use drugs.   Family History   His family history includes Heart disease in his mother; Valvular heart disease in his father.   Allergies No Known Allergies   Home Medications  Prior to Admission medications   Medication Sig Start Date End Date Taking? Authorizing Provider  apixaban (ELIQUIS) 5 MG TABS  tablet Take 1 tablet (5 mg total) by mouth 2 (two) times daily. 02/17/19   Burnell Blanks, MD  hydrALAZINE (APRESOLINE) 50 MG tablet TAKE 1 TABLET THREE TIMES A DAY (NEED FEBRUARY APPOINTMENT FOR FURTHER REFILLS THANKS ) 07/06/19   Burnell Blanks, MD  hydrocortisone cream 1 % Apply 1 application topically 2 (two) times daily.    [provider]  lidocaine (XYLOCAINE) 5 % ointment Apply 1 application topically as needed.    [provider]  metoprolol tartrate (LOPRESSOR) 25 MG tablet TAKE 1 TABLET TWICE A DAY 07/08/19   Burnell Blanks, MD  nitroGLYCERIN (NITROSTAT) 0.4 MG SL tablet Place under the tongue every 5 (five) minutes as needed for chest pain.  02/20/16   [provider]  omeprazole (PRILOSEC) 20 MG capsule Take 1 capsule (20 mg total) by mouth daily. 10/24/17   Burnell Blanks, MD  tamsulosin (FLOMAX) 0.4 MG CAPS capsule Take 0.4 mg by mouth daily after supper.  03/14/17   [provider]     Critical care time: 46 minutes     Georgann Housekeeper, AGACNP-BC [place] for personal  pager PCCM on call pager [phone]  07/10/2019 12:04 AM

## 2019-07-09 NOTE — H&P (Signed)
STROKE NEUROLOGY ADMISSION H&P   CC: code stroke - left sided weakness, facial droop, slurred speech  History is obtained from: patient, chart Attempted to call wife - no answer on multiple calls  HPI: Stephen Copeland is a 79 y.o. male PMH of Afib, systolic CHF, HLD, CAD, HTN, prostate ca, presenting to ER with complaints of left sided weakness. Brought in by EMS after EMS was called to his home.  Last known normal-details are inconsistent but he reported being normal last night at 1 AM and woke up this morning feeling his speech was slurred.  He also noticed some left-sided weakness.  He could not get out of bed.  He takes care of his wife at home, who helped him take his medications.  He took his Eliquis this morning.  He continued to be weak on the left side and eventually called EMS.  When they assessed him, then noted the left-sided weakness, but he was outside the window for IV TPA.  He was not brought in as an acute code stroke but on evaluation by the ED provider, an acute code stroke, possible LVO was activated as he still within the 24-hour window. He seems to be reliable historian but at times perseverates as well. Denies any chest pain nausea vomiting.  Denies any visual symptoms.  Denies any fevers or chills.  Denies any abdominal pain. Reports that he still drives, takes care of his ADLs himself, uses a cane to walk-very rarely would use his wife's walker.  In the emergency room, after the code stroke evaluation, he was examined while in the [place].  Noncontrast head CT with aspect 7 consistent with a right MCA stroke. There was some delay in obtaining imaging because of his severe dysarthria and trying to get hold of family members to get a more clear history.  Multiple attempts to call wife were futile. CTA head and neck was done along with CT perfusion study.  Favorable profile for endovascular treatment-see details below. Discussed with Dr. Estanislado Pandy over the phone in IR code  stroke activated.  In the ER, also noted to be in A. fib with RVR.   LKW: 1 AM on 07/08/2019 tpa given?: no, outside the window Premorbid modified Rankin scale (mRS): 2  ROS: Performed and negative except reported in HPI.  Past Medical History:  Diagnosis Date  . Acid reflux   . CHF (congestive heart failure) ([place])   . Coronary artery disease   . Dilated cardiomyopathy ([place])   . Folliculitis   . Gastric ulcer   . Hiatal hernia   . Hyperlipidemia    a. pt is adamantly against statins.  . Hypertension   . Ischemia    a. Pt states he was diagnosed with "ischemia" in the 1990s but does not know further details, denies hx of heart blockage.  . Nummular dermatitis   . Prostate cancer ([place])   . Renal disorder   . Scoliosis   . Sinus bradycardia   . Stomach ulcer    a. remote ulcer in the 1990s, no hx of bleeding.    Family History  Problem Relation Age of Onset  . Heart disease Mother        Further details not reported, died at age 35  . Valvular heart disease Father        H/o MV surgery, diet at age 76    Social History:   reports that he has never smoked. He has never used smokeless tobacco. He reports  that he does not drink alcohol or use drugs.  Medications  Current Facility-Administered Medications:  .   stroke: mapping our early stages of recovery book, , Does not apply, Once, Amie Portland, MD .  0.9 %  sodium chloride infusion, , Intravenous, Continuous, Amie Portland, MD .  acetaminophen (TYLENOL) tablet 650 mg, 650 mg, Oral, Q4H PRN **OR** acetaminophen (TYLENOL) 160 MG/5ML solution 650 mg, 650 mg, Per Tube, Q4H PRN **OR** acetaminophen (TYLENOL) suppository 650 mg, 650 mg, Rectal, Q4H PRN, Amie Portland, MD .  aspirin 81 MG chewable tablet, , , ,  .  aspirin EC tablet 81 mg, 81 mg, Oral, Daily, Amie Portland, MD .  ceFAZolin (ANCEF) 2-4 GM/100ML-% IVPB, , , ,  .  clopidogrel (PLAVIX) 300 MG tablet, , , ,  .  eptifibatide (INTEGRILIN) 20 MG/10ML injection, ,  , ,  .  nitroGLYCERIN 100 mcg/mL intra-arterial injection, , , ,  .  senna-docusate (Senokot-S) tablet 1 tablet, 1 tablet, Oral, QHS PRN, Amie Portland, MD .  ticagrelor (BRILINTA) 90 MG tablet, , , ,  .  ticagrelor (BRILINTA) tablet 180 mg, 180 mg, Oral, Once, Amie Portland, MD .  tirofiban (AGGRASTAT) 5-0.9 MG/100ML-% injection, , , ,  .  verapamil (ISOPTIN) 2.5 MG/ML injection, , , ,   Current Outpatient Medications:  .  apixaban (ELIQUIS) 5 MG TABS tablet, Take 1 tablet (5 mg total) by mouth 2 (two) times daily., Disp: 180 tablet, Rfl: 1 .  hydrALAZINE (APRESOLINE) 50 MG tablet, TAKE 1 TABLET THREE TIMES A DAY (NEED FEBRUARY APPOINTMENT FOR FURTHER REFILLS THANKS ), Disp: 180 tablet, Rfl: 2 .  hydrocortisone cream 1 %, Apply 1 application topically 2 (two) times daily., Disp: , Rfl:  .  lidocaine (XYLOCAINE) 5 % ointment, Apply 1 application topically as needed., Disp: , Rfl:  .  metoprolol tartrate (LOPRESSOR) 25 MG tablet, TAKE 1 TABLET TWICE A DAY, Disp: 180 tablet, Rfl: 2 .  nitroGLYCERIN (NITROSTAT) 0.4 MG SL tablet, Place under the tongue every 5 (five) minutes as needed for chest pain. , Disp: , Rfl:  .  omeprazole (PRILOSEC) 20 MG capsule, Take 1 capsule (20 mg total) by mouth daily., Disp: 90 capsule, Rfl: 3 .  tamsulosin (FLOMAX) 0.4 MG CAPS capsule, Take 0.4 mg by mouth daily after supper. , Disp: , Rfl:    Exam: Current vital signs: BP 133/81 (BP Location: Left Arm)   Pulse 66   Temp 97.9 F (36.6 C) (Oral)   Resp (!) 21   SpO2 94%  Vital signs in last 24 hours: Temp:  [97.9 F (36.6 C)] 97.9 F (36.6 C) (05/06 2031) Pulse Rate:  [66] 66 (05/06 2031) Resp:  [21] 21 (05/06 2031) BP: (133)/(81) 133/81 (05/06 2031) SpO2:  [94 %] 94 % (05/06 2031) General: Awake alert in no distress HEENT: Normocephalic atraumatic CVS: Irregularly irregular, A. fib with RVR Respiratory: Scattered rales Abdomen: Nondistended nontender Extremities: Warm well perfused with intact  pulses Neurological exam Awake alert oriented x3 Speech is moderately dysarthric There is no evidence of aphasia Follows all commands Cranial nerves: Pupils are equal round react light, extraocular movements appear intact, visual fields appear full to confrontation, there is obvious left lower facial weakness, facial sensation intact, tongue and palate midline. Motor exam: Left upper extremity is 4 -/5 with vertical drift.  Left lower extremity 3/5 with vertical drift.  Right upper and lower extremity are 5/5. Sensory exam: Diminished on the left. Coordination: No obvious ataxia other than proportional for  the weakness. NIH stroke scale-9  Labs I have reviewed labs in epic and the results pertinent to this consultation are:  CBC    Component Value Date/Time   WBC 8.1 07/09/2019 2031   RBC 4.78 07/09/2019 2031   HGB 15.0 07/09/2019 2054   HGB 14.3 04/14/2019 1326   HCT 44.0 07/09/2019 2054   HCT 41.7 04/14/2019 1326   PLT 264 07/09/2019 2031   PLT 252 04/14/2019 1326   MCV 95.8 07/09/2019 2031   MCV 94 04/14/2019 1326   MCH 31.4 07/09/2019 2031   MCHC 32.8 07/09/2019 2031   RDW 13.4 07/09/2019 2031   RDW 13.0 04/14/2019 1326   LYMPHSABS 1.6 07/09/2019 2031   LYMPHSABS 1.8 06/30/2018 0830   MONOABS 0.9 07/09/2019 2031   EOSABS 0.1 07/09/2019 2031   EOSABS 0.3 06/30/2018 0830   BASOSABS 0.0 07/09/2019 2031   BASOSABS 0.1 06/30/2018 0830    CMP     Component Value Date/Time   NA 145 07/09/2019 2054   NA 144 04/14/2019 1326   K 3.7 07/09/2019 2054   CL 110 07/09/2019 2054   CO2 20 (L) 07/09/2019 2031   GLUCOSE 123 (H) 07/09/2019 2054   BUN 30 (H) 07/09/2019 2054   BUN 28 (H) 04/14/2019 1326   CREATININE 1.60 (H) 07/09/2019 2054   CREATININE 1.15 01/30/2016 0949   CALCIUM 9.9 07/09/2019 2031   PROT 7.3 07/09/2019 2031   PROT 6.9 04/14/2019 1326   ALBUMIN 3.9 07/09/2019 2031   ALBUMIN 4.1 04/14/2019 1326   AST 19 07/09/2019 2031   ALT 14 07/09/2019 2031   ALKPHOS  102 07/09/2019 2031   BILITOT 1.3 (H) 07/09/2019 2031   BILITOT 0.8 04/14/2019 1326   GFRNONAA 38 (L) 07/09/2019 2031   GFRAA 43 (L) 07/09/2019 2031    Lipid Panel     Component Value Date/Time   CHOL 130 12/20/2015 0325   TRIG 51 12/20/2015 0325   HDL 27 (L) 12/20/2015 0325   CHOLHDL 4.8 12/20/2015 0325   VLDL 10 12/20/2015 0325   LDLCALC 93 12/20/2015 0325   Imaging I have reviewed the images obtained:  CT-scan of the brain-aspect 7 consistent with a right hemispheric stroke.  No bleed. CTA head and neck: Probable ruptured plaque at the right carotid bifurcation with associated severe near occlusive stenosis of the right ICA.  Marked attenuation and diminutive flow distally to the right ICA terminus.  Possible superimposed dissection may be present.  Distal reconstitution of the right M1 segment via collateral flow across the circle of Willis but the distal embolic occlusion of a proximal right M2 M3 branch superior division also seen.  Atheromatous changes about the left carotid bifurcation and left carotid siphons without high-grade stenosis.  50% ulcerated stenosis at the origins of both vertebral arteries.  60% stenosis of the origin of the right subclavian. CT perfusion with 19 cc core, T-max greater than 6 volume 52 cc = 33 cc surrounding ischemic penumbra/area to save.  Assessment:  Mr. Trowbridge is a 79 year old man with above past medical history presented for sudden onset slurred speech, left facial weakness and left-sided weakness along with left hemisensory loss. He has a past medical history of atrial fibrillation, coronary artery disease, hypertension hyperlipidemia and prostate cancer. On examination, his exam was consistent with a right MCA territory stroke. He was outside the window for TPA at the time of presentation. His CT angio head and neck showed a right carotid bifurcation near occlusive stenosis/plaque rupture along with right  M2 superior division occlusion. CT  perfusion study was favorable with 19 cc: T-max greater than 6-second volume  52 cc. Case discussed with endovascular. Risk-benefit discussed with the patient who signed the consent himself, witnessed by RN. IR code stroke activated and patient taken for thrombectomy emergently.  Loaded with Brilinta and aspirin. Last dose of Eliquis taken this morning.  Multiple attempts to call wife remain unanswered.  Impression: -Acute ischemic stroke-atheroembolic versus cardioembolic. -Coronary artery disease -Hypertension -Hyperlipidemia -Atrial fibrillation with RVR -Prostate cancer   Plan: Acute Ischemic Stroke Cerebral infarction due to embolism of right middle cerebral artery Occlusion and stenosis of R carotid artery Acuity: Acute Current Suspected Etiology: Atheroembolic versus cardioembolic Continue Evaluation:  -Admit to: Neurological ICU -Continue antiplatelets per endovascular -Hold Eliquis for now.Might need heparin drip if remains in A. fib with RVR.  Would appreciate PCCM consultation for medical management while in the ICU. -Blood pressure control-goal will depend on an result of the endovascular procedure. -MRI/ECHO/A1C/Lipid panel. -Hyperglycemia management per SSI to maintain glucose 140-180mg /dL. -PT/OT/ST therapies and recommendations when able  CNS -Close neuro monitoring  Dysarthria Dysphagia following cerebral infarction  -NPO until cleared by speech -ST  Hemiplegia and hemiparesis following cerebral infarction affecting left dominant side  -PT/OT -PM&R consult  RESP Possible aspiration pneumonia Requiring ventilation for general anesthesia for procedure acute  -vent management per anesthesia/ICU -wean when able -Chest x-ray -Hold off antibiotics for now  CV Essential hypertension -Blood pressure goals after endovascular procedure-if successful revascularization, goal blood pressure will be systolic 1 16-1 40. Otherwise will allow for permissive  hypertension-treat only if systolic is greater than 096. Use labetalol IV followed by Cleviprex drip as needed.  A. fib with RVR -Metoprolol -Medical management per PCCM.  Will request consultation. -We will hold off on Eliquis.  Will await completion of the endovascular procedure to see if stenting is done and if he needs dual antiplatelets.  We will have to wait for the procedure to complete to make a decision on if heparin drip as needed for management of the A. fib with RVR.  Obtain transthoracic echo  HEME No active issues Monitor CBC in the morning.  ENDO Check A1c -goal HgbA1c < 7  GI/GU CKD 3 -Gentle hydration -avoid nephrotoxic agents  Fluid/Electrolyte Disorders No active issues Check BMP in the morning and replete electrolytes as necessary.  ID Possible Aspiration PNA -CXR -NPO -Monitor for now.  Hold antibiotics.  Prophylaxis DVT: SCDs GI: PPI Bowel: Docusate senna  Diet: NPO until cleared by speech/bedside swallow evaluation.  Code Status: Full Code    THE FOLLOWING WERE PRESENT ON ADMISSION: -Acute ischemic stroke -Atrial fibrillation with RVR -Possible aspiration pneumonia -CKD 3 -Dysphagia/dysarthria -Hemiparesis -Chronic systolic CHF -Prostate cancer  Plan was discussed with the ED provider-Dr. Ronnald Nian who had activated the code stroke. Plan also discussed with Dr. Estanislado Pandy, neuro IR. Consults called: PCCM, spoke with Dr. Oletta Darter. PCCM will evaluate once patient reaches ICU  -- Amie Portland, MD [facility] Pager: [phone] If 7pm to 7am, please call on call as listed on AMION.  CRITICAL CARE ATTESTATION Performed by: Amie Portland, MD Total critical care time: 60 minutes Critical care time was exclusive of separately billable procedures and treating other patients and/or supervising APPs/Residents/Students Critical care was necessary to treat or prevent imminent or life-threatening deterioration due to acute ischemic  stroke, atrial fibrillation with RVR, possible aspiration pneumonia, hemiparesis/hemiplegia. This patient is critically ill and at significant risk for neurological worsening and/or death and care requires  constant monitoring. Critical care was time spent personally by me on the following activities: development of treatment plan with patient and/or surrogate as well as nursing, discussions with consultants, evaluation of patient's response to treatment, examination of patient, obtaining history from patient or surrogate, ordering and performing treatments and interventions, ordering and review of laboratory studies, ordering and review of radiographic studies, pulse oximetry, re-evaluation of patient's condition, participation in multidisciplinary rounds and medical decision making of high complexity in the care of this patient.

## 2019-07-10 ENCOUNTER — Inpatient Hospital Stay (HOSPITAL_COMMUNITY): Payer: Medicare Other

## 2019-07-10 DIAGNOSIS — I169 Hypertensive crisis, unspecified: Secondary | ICD-10-CM

## 2019-07-10 DIAGNOSIS — R1312 Dysphagia, oropharyngeal phase: Secondary | ICD-10-CM

## 2019-07-10 DIAGNOSIS — E7800 Pure hypercholesterolemia, unspecified: Secondary | ICD-10-CM

## 2019-07-10 DIAGNOSIS — J95821 Acute postprocedural respiratory failure: Secondary | ICD-10-CM

## 2019-07-10 DIAGNOSIS — J9601 Acute respiratory failure with hypoxia: Secondary | ICD-10-CM | POA: Diagnosis not present

## 2019-07-10 DIAGNOSIS — I6521 Occlusion and stenosis of right carotid artery: Secondary | ICD-10-CM

## 2019-07-10 DIAGNOSIS — I639 Cerebral infarction, unspecified: Secondary | ICD-10-CM | POA: Diagnosis not present

## 2019-07-10 DIAGNOSIS — I6389 Other cerebral infarction: Secondary | ICD-10-CM | POA: Diagnosis not present

## 2019-07-10 DIAGNOSIS — I6601 Occlusion and stenosis of right middle cerebral artery: Secondary | ICD-10-CM | POA: Diagnosis present

## 2019-07-10 DIAGNOSIS — I255 Ischemic cardiomyopathy: Secondary | ICD-10-CM

## 2019-07-10 HISTORY — PX: IR ANGIO INTRA EXTRACRAN SEL COM CAROTID INNOMINATE UNI L MOD SED: IMG5358

## 2019-07-10 HISTORY — PX: IR INTRAVSC STENT CERV CAROTID W/O EMB-PROT MOD SED INC ANGIO: IMG2304

## 2019-07-10 HISTORY — PX: IR PERCUTANEOUS ART THROMBECTOMY/INFUSION INTRACRANIAL INC DIAG ANGIO: IMG6087

## 2019-07-10 HISTORY — PX: IR CT HEAD LTD: IMG2386

## 2019-07-10 HISTORY — PX: IR ANGIO VERTEBRAL SEL SUBCLAVIAN INNOMINATE UNI R MOD SED: IMG5365

## 2019-07-10 LAB — GLUCOSE, CAPILLARY
Glucose-Capillary: 160 mg/dL — ABNORMAL HIGH (ref 70–99)
Glucose-Capillary: 120 mg/dL — ABNORMAL HIGH (ref 70–99)
Glucose-Capillary: 130 mg/dL — ABNORMAL HIGH (ref 70–99)
Glucose-Capillary: 130 mg/dL — ABNORMAL HIGH (ref 70–99)
Glucose-Capillary: 119 mg/dL — ABNORMAL HIGH (ref 70–99)
Glucose-Capillary: 133 mg/dL — ABNORMAL HIGH (ref 70–99)

## 2019-07-10 LAB — CBC WITH DIFFERENTIAL/PLATELET
Abs Immature Granulocytes: 0.05 10*3/uL (ref 0.00–0.07)
Basophils Absolute: 0 10*3/uL (ref 0.0–0.1)
Basophils Relative: 0 %
Eosinophils Absolute: 0 10*3/uL (ref 0.0–0.5)
Eosinophils Relative: 0 %
HCT: 38 % — ABNORMAL LOW (ref 39.0–52.0)
Hemoglobin: 12.3 g/dL — ABNORMAL LOW (ref 13.0–17.0)
Immature Granulocytes: 0 %
Lymphocytes Relative: 9 %
Lymphs Abs: 1 10*3/uL (ref 0.7–4.0)
MCH: 31.4 pg (ref 26.0–34.0)
MCHC: 32.4 g/dL (ref 30.0–36.0)
MCV: 96.9 fL (ref 80.0–100.0)
Monocytes Absolute: 0.7 10*3/uL (ref 0.1–1.0)
Monocytes Relative: 6 %
Neutro Abs: 9.4 10*3/uL — ABNORMAL HIGH (ref 1.7–7.7)
Neutrophils Relative %: 85 %
Platelets: 163 10*3/uL (ref 150–400)
RBC: 3.92 MIL/uL — ABNORMAL LOW (ref 4.22–5.81)
RDW: 13.7 % (ref 11.5–15.5)
WBC: 11.2 10*3/uL — ABNORMAL HIGH (ref 4.0–10.5)
nRBC: 0 % (ref 0.0–0.2)

## 2019-07-10 LAB — URINALYSIS, ROUTINE W REFLEX MICROSCOPIC
Bilirubin Urine: NEGATIVE
Glucose, UA: NEGATIVE mg/dL
Hgb urine dipstick: NEGATIVE
Ketones, ur: NEGATIVE mg/dL
Leukocytes,Ua: NEGATIVE
Nitrite: NEGATIVE
Protein, ur: NEGATIVE mg/dL
Specific Gravity, Urine: >1.046 — ABNORMAL HIGH (ref 1.005–1.030)
pH: 5 (ref 5.0–8.0)

## 2019-07-10 LAB — HEMOGLOBIN A1C
Hgb A1c MFr Bld: 5.8 % — ABNORMAL HIGH (ref 4.8–5.6)
Mean Plasma Glucose: 119.76 mg/dL

## 2019-07-10 LAB — ECHOCARDIOGRAM COMPLETE
Height: 72 in
Weight: 2973.56 oz

## 2019-07-10 LAB — ETHANOL: Alcohol, Ethyl (B): <10 mg/dL (ref <10)

## 2019-07-10 LAB — TROPONIN I (HIGH SENSITIVITY)
Troponin I (High Sensitivity): 27 ng/L — ABNORMAL HIGH (ref <18)
Troponin I (High Sensitivity): 32 ng/L — ABNORMAL HIGH (ref <18)

## 2019-07-10 LAB — POCT I-STAT 7, (LYTES, BLD GAS, ICA,H+H)
Acid-base deficit: 7 mmol/L — ABNORMAL HIGH (ref 0.0–2.0)
Bicarbonate: 16.9 mmol/L — ABNORMAL LOW (ref 20.0–28.0)
Calcium, Ion: 1.22 mmol/L (ref 1.15–1.40)
HCT: 34 % — ABNORMAL LOW (ref 39.0–52.0)
Hemoglobin: 11.6 g/dL — ABNORMAL LOW (ref 13.0–17.0)
O2 Saturation: 96 %
Patient temperature: 97.6
Potassium: 3.3 mmol/L — ABNORMAL LOW (ref 3.5–5.1)
Sodium: 143 mmol/L (ref 135–145)
TCO2: 18 mmol/L — ABNORMAL LOW (ref 22–32)
pCO2 arterial: 29.4 mmHg — ABNORMAL LOW (ref 32.0–48.0)
pH, Arterial: 7.365 (ref 7.350–7.450)
pO2, Arterial: 80 mmHg — ABNORMAL LOW (ref 83.0–108.0)

## 2019-07-10 LAB — RAPID URINE DRUG SCREEN, HOSP PERFORMED
Amphetamines: NOT DETECTED
Barbiturates: NOT DETECTED
Benzodiazepines: NOT DETECTED
Cocaine: NOT DETECTED
Opiates: NOT DETECTED
Tetrahydrocannabinol: NOT DETECTED

## 2019-07-10 LAB — MRSA PCR SCREENING: MRSA by PCR: POSITIVE — AB

## 2019-07-10 LAB — PHOSPHORUS: Phosphorus: 3.2 mg/dL (ref 2.5–4.6)

## 2019-07-10 LAB — LIPID PANEL
Cholesterol: 141 mg/dL (ref 0–200)
HDL: 29 mg/dL — ABNORMAL LOW (ref >40)
LDL Cholesterol: 90 mg/dL (ref 0–99)
Total CHOL/HDL Ratio: 4.9 RATIO
Triglycerides: 111 mg/dL (ref <150)
VLDL: 22 mg/dL (ref 0–40)

## 2019-07-10 LAB — BASIC METABOLIC PANEL
Anion gap: 14 (ref 5–15)
BUN: 24 mg/dL — ABNORMAL HIGH (ref 8–23)
CO2: 15 mmol/L — ABNORMAL LOW (ref 22–32)
Calcium: 8.3 mg/dL — ABNORMAL LOW (ref 8.9–10.3)
Chloride: 112 mmol/L — ABNORMAL HIGH (ref 98–111)
Creatinine, Ser: 1.56 mg/dL — ABNORMAL HIGH (ref 0.61–1.24)
GFR calc Af Amer: 49 mL/min — ABNORMAL LOW (ref >60)
GFR calc non Af Amer: 42 mL/min — ABNORMAL LOW (ref >60)
Glucose, Bld: 176 mg/dL — ABNORMAL HIGH (ref 70–99)
Potassium: 3.4 mmol/L — ABNORMAL LOW (ref 3.5–5.1)
Sodium: 141 mmol/L (ref 135–145)

## 2019-07-10 LAB — PLATELET INHIBITION P2Y12: Platelet Function  P2Y12: 64 [PRU] — ABNORMAL LOW (ref 182–335)

## 2019-07-10 LAB — TRIGLYCERIDES: Triglycerides: 113 mg/dL (ref <150)

## 2019-07-10 LAB — MAGNESIUM: Magnesium: 1.7 mg/dL (ref 1.7–2.4)

## 2019-07-10 MED ORDER — HYDRALAZINE HCL 50 MG PO TABS: 50.0000 mg | ORAL_TABLET | Freq: Three times a day (TID) | ORAL | Status: DC

## 2019-07-10 MED ORDER — TRAMADOL HCL 50 MG PO TABS
50.0000 mg | ORAL_TABLET | Freq: Four times a day (QID) | ORAL | Status: DC | PRN
  Filled 2019-07-10: qty 1

## 2019-07-10 MED ORDER — PRO-STAT SUGAR FREE PO LIQD: 30.0000 mL | Freq: Two times a day (BID) | ORAL | Status: DC

## 2019-07-10 MED ORDER — METOPROLOL TARTRATE 25 MG PO TABS
12.5000 mg | ORAL_TABLET | Freq: Two times a day (BID) | ORAL | Status: DC
  Administered 2019-07-10 – 2019-07-12 (×5): 12.5 mg
  Filled 2019-07-10 (×6): qty 1

## 2019-07-10 MED ORDER — VITAL HIGH PROTEIN PO LIQD: 1000.0000 mL | ORAL | Status: DC

## 2019-07-10 MED ORDER — TRAMADOL 5 MG/ML ORAL SUSPENSION
50.0000 mg | Freq: Four times a day (QID) | ORAL | Status: DC | PRN
  Filled 2019-07-10: qty 10

## 2019-07-10 MED ORDER — ACETAMINOPHEN 325 MG PO TABS
650.0000 mg | ORAL_TABLET | ORAL | Status: DC | PRN
  Administered 2019-07-24: 650 mg via ORAL

## 2019-07-10 MED ORDER — CHLORHEXIDINE GLUCONATE 0.12% ORAL RINSE (MEDLINE KIT)
15.0000 mL | Freq: Two times a day (BID) | OROMUCOSAL | Status: DC
  Administered 2019-07-10 – 2019-07-11 (×3): 15 mL via OROMUCOSAL

## 2019-07-10 MED ORDER — INSULIN ASPART 100 UNIT/ML ~~LOC~~ SOLN
0.0000 [IU] | SUBCUTANEOUS | Status: DC
  Administered 2019-07-10 – 2019-07-13 (×15): 2 [IU] via SUBCUTANEOUS
  Administered 2019-07-13 (×3): 3 [IU] via SUBCUTANEOUS
  Administered 2019-07-14 – 2019-07-28 (×35): 2 [IU] via SUBCUTANEOUS
  Administered 2019-07-29: 3 [IU] via SUBCUTANEOUS
  Administered 2019-07-29 – 2019-07-31 (×5): 2 [IU] via SUBCUTANEOUS
  Administered 2019-08-01: 3 [IU] via SUBCUTANEOUS
  Administered 2019-08-01 (×2): 2 [IU] via SUBCUTANEOUS
  Administered 2019-08-02: 3 [IU] via SUBCUTANEOUS
  Administered 2019-08-02 – 2019-08-06 (×10): 2 [IU] via SUBCUTANEOUS
  Administered 2019-08-07: 3 [IU] via SUBCUTANEOUS
  Administered 2019-08-07 – 2019-08-18 (×28): 2 [IU] via SUBCUTANEOUS
  Administered 2019-08-18: 3 [IU] via SUBCUTANEOUS
  Administered 2019-08-19 – 2019-08-26 (×17): 2 [IU] via SUBCUTANEOUS

## 2019-07-10 MED ORDER — ASPIRIN 81 MG PO CHEW: 81.0000 mg | CHEWABLE_TABLET | Freq: Every day | ORAL | Status: DC

## 2019-07-10 MED ORDER — SODIUM CHLORIDE 0.9 % IV SOLN: INTRAVENOUS | Status: DC

## 2019-07-10 MED ORDER — SODIUM CHLORIDE 0.9 % IV SOLN
INTRAVENOUS | Status: DC | PRN
  Administered 2019-07-13: 1000 mL via INTRAVENOUS

## 2019-07-10 MED ORDER — FENTANYL CITRATE (PF) 100 MCG/2ML IJ SOLN: 25.0000 ug | INTRAMUSCULAR | Status: DC | PRN

## 2019-07-10 MED ORDER — PROPOFOL 1000 MG/100ML IV EMUL
0.0000 ug/kg/min | INTRAVENOUS | Status: DC
  Administered 2019-07-10: 01:00:00 25 ug/kg/min via INTRAVENOUS
  Filled 2019-07-10: qty 100

## 2019-07-10 MED ORDER — ORAL CARE MOUTH RINSE
15.0000 mL | OROMUCOSAL | Status: DC
  Administered 2019-07-10 – 2019-07-11 (×12): 15 mL via OROMUCOSAL

## 2019-07-10 MED ORDER — FENTANYL CITRATE (PF) 100 MCG/2ML IJ SOLN
25.0000 ug | INTRAMUSCULAR | Status: DC | PRN
  Administered 2019-07-10: 50 ug via INTRAVENOUS
  Administered 2019-07-10: 100 ug via INTRAVENOUS
  Administered 2019-07-10 (×2): 50 ug via INTRAVENOUS
  Filled 2019-07-10 (×3): qty 2

## 2019-07-10 MED ORDER — ACETAMINOPHEN 160 MG/5ML PO SOLN
650.0000 mg | ORAL | Status: DC | PRN
  Administered 2019-07-10 – 2019-08-20 (×20): 650 mg
  Filled 2019-07-10 (×22): qty 20.3

## 2019-07-10 MED ORDER — ACETAMINOPHEN 650 MG RE SUPP
650.0000 mg | RECTAL | Status: DC | PRN
  Administered 2019-07-25: 650 mg via RECTAL
  Filled 2019-07-10: qty 1

## 2019-07-10 MED ORDER — TRAMADOL HCL 50 MG PO TABS
50.0000 mg | ORAL_TABLET | Freq: Four times a day (QID) | ORAL | Status: DC | PRN
  Administered 2019-07-10 – 2019-07-12 (×8): 50 mg
  Filled 2019-07-10 (×8): qty 1

## 2019-07-10 MED ORDER — DOCUSATE SODIUM 50 MG/5ML PO LIQD: 100.0000 mg | Freq: Two times a day (BID) | ORAL | Status: DC

## 2019-07-10 MED ORDER — CLEVIDIPINE BUTYRATE 0.5 MG/ML IV EMUL
0.0000 mg/h | INTRAVENOUS | Status: AC
  Administered 2019-07-10: 15 mg/h via INTRAVENOUS
  Administered 2019-07-10: 12:00:00 18 mg/h via INTRAVENOUS
  Administered 2019-07-10: 1 mg/h via INTRAVENOUS
  Administered 2019-07-10: 4 mg/h via INTRAVENOUS
  Administered 2019-07-10: 11:00:00 21 mg/h via INTRAVENOUS
  Administered 2019-07-10: 8 mg/h via INTRAVENOUS
  Administered 2019-07-10: 13:00:00 19 mg/h via INTRAVENOUS
  Filled 2019-07-10 (×2): qty 50
  Filled 2019-07-10: qty 100
  Filled 2019-07-10 (×4): qty 50

## 2019-07-10 MED ORDER — PANTOPRAZOLE SODIUM 40 MG IV SOLR
40.0000 mg | Freq: Every day | INTRAVENOUS | Status: DC
  Administered 2019-07-10: 11:00:00 40 mg via INTRAVENOUS
  Filled 2019-07-10: qty 40

## 2019-07-10 MED ORDER — ASPIRIN 81 MG PO CHEW
81.0000 mg | CHEWABLE_TABLET | Freq: Every day | ORAL | Status: DC
  Administered 2019-07-10 – 2019-08-05 (×27): 81 mg
  Filled 2019-07-10 (×27): qty 1

## 2019-07-10 MED ORDER — METOPROLOL TARTRATE 25 MG PO TABS: 12.5000 mg | ORAL_TABLET | Freq: Two times a day (BID) | ORAL | Status: DC

## 2019-07-10 MED ORDER — METOPROLOL TARTRATE 25 MG PO TABS: 25.0000 mg | ORAL_TABLET | Freq: Two times a day (BID) | ORAL | Status: DC

## 2019-07-10 MED ORDER — CHLORHEXIDINE GLUCONATE CLOTH 2 % EX PADS
6.0000 | MEDICATED_PAD | Freq: Every day | CUTANEOUS | Status: DC
  Administered 2019-07-10 – 2019-07-14 (×3): 6 via TOPICAL

## 2019-07-10 MED ORDER — PANTOPRAZOLE SODIUM 40 MG PO PACK
40.0000 mg | PACK | Freq: Every day | ORAL | Status: DC
  Administered 2019-07-11 – 2019-07-12 (×2): 40 mg
  Filled 2019-07-10 (×2): qty 20

## 2019-07-10 MED ORDER — TICAGRELOR 90 MG PO TABS: 90.0000 mg | ORAL_TABLET | Freq: Two times a day (BID) | ORAL | Status: DC

## 2019-07-10 MED ORDER — MUPIROCIN 2 % EX OINT
1.0000 "application " | TOPICAL_OINTMENT | Freq: Two times a day (BID) | CUTANEOUS | Status: AC
  Administered 2019-07-10 – 2019-07-14 (×9): 1 via NASAL
  Filled 2019-07-10 (×4): qty 22

## 2019-07-10 MED ORDER — TICAGRELOR 90 MG PO TABS
90.0000 mg | ORAL_TABLET | Freq: Two times a day (BID) | ORAL | Status: DC
  Administered 2019-07-10 – 2019-08-26 (×91): 90 mg
  Filled 2019-07-10 (×87): qty 1

## 2019-07-10 MED ORDER — OSMOLITE 1.5 CAL PO LIQD
1000.0000 mL | ORAL | Status: DC
  Administered 2019-07-10 – 2019-07-19 (×9): 1000 mL
  Filled 2019-07-10 (×18): qty 1000

## 2019-07-10 MED ORDER — PROPOFOL 500 MG/50ML IV EMUL
INTRAVENOUS | Status: DC | PRN
  Administered 2019-07-10: 25 ug/kg/min via INTRAVENOUS

## 2019-07-10 MED ORDER — TICAGRELOR 90 MG PO TABS
90.0000 mg | ORAL_TABLET | Freq: Two times a day (BID) | ORAL | Status: DC
  Administered 2019-07-18 – 2019-08-13 (×3): 90 mg via ORAL
  Filled 2019-07-10 (×22): qty 1

## 2019-07-10 MED ORDER — POTASSIUM CHLORIDE 10 MEQ/100ML IV SOLN
10.0000 meq | INTRAVENOUS | Status: AC
  Administered 2019-07-10 (×4): 10 meq via INTRAVENOUS
  Filled 2019-07-10 (×2): qty 100

## 2019-07-10 MED ORDER — TIROFIBAN HCL IN NACL 5-0.9 MG/100ML-% IV SOLN
0.0750 ug/kg/min | INTRAVENOUS | Status: DC
  Filled 2019-07-10: qty 100

## 2019-07-10 MED ORDER — PRO-STAT SUGAR FREE PO LIQD
30.0000 mL | Freq: Every day | ORAL | Status: DC
  Administered 2019-07-11 – 2019-07-24 (×13): 30 mL
  Filled 2019-07-10 (×14): qty 30

## 2019-07-10 MED ORDER — POLYETHYLENE GLYCOL 3350 17 G PO PACK
17.0000 g | PACK | Freq: Every day | ORAL | Status: DC
  Filled 2019-07-10: qty 1

## 2019-07-10 MED ORDER — NICARDIPINE HCL IN NACL 20-0.86 MG/200ML-% IV SOLN
INTRAVENOUS | Status: DC | PRN
Start: 2019-07-10 — End: 2019-07-10
  Administered 2019-07-10: 5 mg/h via INTRAVENOUS

## 2019-07-10 NOTE — Plan of Care (Addendum)
Post IR-discussed with Dr. Estanislado Pandy Status post right MCA M2 trifurcation thrombectomy. Status post right ICA stent. Postprocedure Dyna CT with contrast staining versus subarachnoid hemorrhage in the sylvian fissure and sulci higher up as well.  Recommendations: -In addition to recommendations provided earlier, would repeat a head CT in about 4 hours. -Would hold off on any heparin drip for now unless remains in A. fib with RVR.  Will discuss with PCCM. -Would need morning Brilinta dose-90 mg.  After that continue 90 mg twice daily. -Aspirin 81 tomorrow evening and daily. Decisions on antiplatelets might be changed based on patient's overall condition including A. fib with RVR as well as follow-up head imaging. -Prior to discharge, decision will have to be made upon anticoagulation and antiplatelets in further detail based on the clinical course given that he has atrial fibrillation as well as now a right carotid stent. -Dr. Erlinda Hong has already ordered MRI of the head in addition to the MRI brain.  For the stent, I will order a carotid Doppler in the morning.  Stroke team will continue to follow   -- Amie Portland, MD [facility] Pager: [phone] If 7pm to 7am, please call on call as listed on AMION.

## 2019-07-10 NOTE — Procedures (Signed)
Cortrak  Person Inserting Tube:  Lynetta Tomczak, RD Tube Type:  Cortrak - 43 inches Tube Location:  Right nare Initial Placement:  Stomach Secured by: Bridle Technique Used to Measure Tube Placement:  Documented cm marking at nare/ corner of mouth Cortrak Secured At:  67 cm   No x-ray is required. RN may begin using tube.   If the tube becomes dislodged please keep the tube and contact the Cortrak team at [email or web address] ([email or web address]) for replacement.  If after hours and replacement cannot be delayed, place a NG tube and confirm placement with an abdominal x-ray.    Symantha Steeber RD, LDN Clinical Nutrition Pager listed in AMION    

## 2019-07-10 NOTE — Anesthesia Postprocedure Evaluation (Signed)
Anesthesia Post Note  Patient: Stephen Copeland  Procedure(s) Performed: IR WITH ANESTHESIA (N/A )     Patient location during evaluation: ICU Anesthesia Type: General Level of consciousness: patient remains intubated per anesthesia plan Pain management: pain level controlled Vital Signs Assessment: post-procedure vital signs reviewed and stable Respiratory status: patient remains intubated per anesthesia plan Cardiovascular status: stable Postop Assessment: no apparent nausea or vomiting Anesthetic complications: no    Last Vitals:  Vitals:   07/10/19 0101 07/10/19 0109  BP: 122/72 (!) 144/58  Pulse: 61 63  Resp: 15 15  Temp: (!) 36.4 C   SpO2: 96% 98%    Last Pain:  Vitals:   07/10/19 0101  TempSrc: Axillary                 Tai Syfert

## 2019-07-10 NOTE — Evaluation (Signed)
Occupational Therapy Evaluation Patient Details Name: Stephen Copeland MRN: [id number] DOB: 09/06/1940 Today's Date: 07/10/2019    History of Present Illness 79 y.o. male admitted on 07/09/19 for L sided weakness, facial droop, and slurred speech.  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  He did not get any tPA as he was outside of the window.  Pt intubated 5/6 for procedure and extubated in the AM of 07/10/19.  Pt with other significant PMH of sinus brady, scoliosis, prostate CA s/p surgery, HTN, dilated cardiomyopathy, CAD, CHF.     Clinical Impression   PTA, pt was living with his wife and reports he was independent; difficult to collect home information as pt with decreased awareness and attention. Pt currently requiring Min A for UB ADLs, Max A for LB ADLs, and Max A +2 for sit<>stand transfer. Pt presenting with decreased safety, cognition, awareness, balance, and strength. Pt will require further acute OT to facilitate safe dc. Recommend dc to CIR for intensive OT to optimize safety, independence with ADLs, and return to PLOF.    Follow Up Recommendations  CIR;Supervision/Assistance - 24 hour    Equipment Recommendations  Other (comment)(Defer to next venue)    Recommendations for Other Services PT consult;Rehab consult;Speech consult     Precautions / Restrictions Precautions Precautions: Fall Precaution Comments: left sided weakness      Mobility Bed Mobility Overal bed mobility: Needs Assistance Bed Mobility: Supine to Sit;Sit to Supine     Supine to sit: Mod assist;HOB elevated;+2 for physical assistance Sit to supine: +2 for physical assistance;Mod assist   General bed mobility comments: Pt needed mod assist to come up to sitting from maximally elevated HOB, pt needed assist at trunk and to help initiate movement of legs over the side of the bed.  Heavier assist at trunk to return to supine, but pt lifting his own legs.    Transfers Overall transfer level: Needs assistance Equipment used: 2 person hand held assist Transfers: Sit to/from Stand Sit to Stand: Max assist;+2 physical assistance;From elevated surface         General transfer comment: Max two person physical assist for partial squat stand at EOB.  Pt unable to get fully upright standing.      Balance Overall balance assessment: Needs assistance Sitting-balance support: Feet supported;Bilateral upper extremity supported Sitting balance-Leahy Scale: Poor Sitting balance - Comments: Min assist EOB more for safety than for true balance.    Standing balance support: Bilateral upper extremity supported Standing balance-Leahy Scale: Poor Standing balance comment: needed significant two person assist to achieve partial stand (max)                           ADL either performed or assessed with clinical judgement   ADL Overall ADL's : Needs assistance/impaired Eating/Feeding: NPO   Grooming: Minimal assistance;Sitting   Upper Body Bathing: Minimal assistance;Sitting   Lower Body Bathing: Maximal assistance;+2 for physical assistance;+2 for safety/equipment;Sit to/from stand   Upper Body Dressing : Minimal assistance;Sitting   Lower Body Dressing: Maximal assistance;Sit to/from stand;+2 for physical assistance Lower Body Dressing Details (indicate cue type and reason): Donn socks at bed level. Max A +2 for sit<>Stand Toilet Transfer: Maximal assistance;+2 for physical assistance;+2 for safety/equipment Toilet Transfer Details (indicate cue type and reason): sit<>stand at [place] and Hygiene: Maximal assistance;+2 for physical assistance;+2 for safety/equipment;Sit to/from stand Toileting - Clothing Manipulation Details (indicate cue type and  reason): Max A for peri care     Functional mobility during ADLs: Maximal assistance;+2 for physical assistance;+2 for safety/equipment(sit<>Stand at  EOB) General ADL Comments: Pt presenting with poor cognition and balance impacting his safety with ADLs     Vision         Perception     Praxis      Pertinent Vitals/Pain Pain Assessment: Faces Faces Pain Scale: Hurts even more Pain Location: he has an unfortunate skin tear on his penis Pain Descriptors / Indicators: Nagging;Jabbing Pain Intervention(s): Limited activity within patient's tolerance;Monitored during session     Hand Dominance Right   Extremity/Trunk Assessment Upper Extremity Assessment Upper Extremity Assessment: Difficult to assess due to impaired cognition   Lower Extremity Assessment Lower Extremity Assessment: Defer to PT evaluation RLE Deficits / Details: right leg is generally weak, but he can rais against gravity, left leg is mildly weaker and he can raise it against partial gravity.   LLE Deficits / Details: right leg is generally weak, but he can rais against gravity, left leg is mildly weaker and he can raise it against partial gravity.     Cervical / Trunk Assessment Cervical / Trunk Assessment: Other exceptions Cervical / Trunk Exceptions: chart lists h/o scoliosis   Communication Communication Communication: Expressive difficulties(slurred)   Cognition Arousal/Alertness: Awake/alert Behavior During Therapy: Restless;Impulsive Overall Cognitive Status: Impaired/Different from baseline Area of Impairment: Safety/judgement;Awareness;Problem solving                         Safety/Judgement: Decreased awareness of safety;Decreased awareness of deficits Awareness: Intellectual Problem Solving: Difficulty sequencing;Requires verbal cues;Requires tactile cues General Comments: Pt with poor safety awareness and awareness of deficits.  He attempts to move before staff is ready for him to move, restless and talking near constantly.    General Comments  VSS even on RA during mobility, but since pt was returning to bed we placed 6 L O2 [place]  back on his nose for sleeping.     Exercises     Shoulder Instructions      Home Living Family/patient expects to be discharged to:: Private residence Living Arrangements: Spouse/significant other Available Help at Discharge: Family Type of Home: House                           Additional Comments: Pt was a bit tangental, so we were unable to get a great home set up.       Prior Functioning/Environment Level of Independence: Independent                 OT Problem List: Decreased strength;Decreased range of motion;Decreased activity tolerance;Impaired balance (sitting and/or standing);Decreased cognition;Decreased safety awareness;Decreased knowledge of use of DME or AE;Decreased knowledge of precautions;Pain      OT Treatment/Interventions: Self-care/ADL training;Therapeutic exercise;Energy conservation;DME and/or AE instruction;Therapeutic activities;Patient/family education    OT Goals(Current goals can be found in the care plan section) Acute Rehab OT Goals Patient Stated Goal: none stated except he wants the foley catheter and cortrac removed.  OT Goal Formulation: With patient Time For Goal Achievement: 07/24/19 Potential to Achieve Goals: Good  OT Frequency: Min 2X/week   Barriers to D/C:            Co-evaluation PT/OT/SLP Co-Evaluation/Treatment: Yes Reason for Co-Treatment: Complexity of the patient's impairments (multi-system involvement);For patient/therapist safety;To address functional/ADL transfers PT goals addressed during session: Mobility/safety with mobility;Balance;Strengthening/ROM OT goals addressed during session:  ADL's and self-care      AM-PAC OT "6 Clicks" Daily Activity     Outcome Measure Help from another person eating meals?: Total Help from another person taking care of personal grooming?: A Little Help from another person toileting, which includes using toliet, bedpan, or urinal?: A Lot Help from another person bathing  (including washing, rinsing, drying)?: A Lot Help from another person to put on and taking off regular upper body clothing?: A Little Help from another person to put on and taking off regular lower body clothing?: A Lot 6 Click Score: 13   End of Session Equipment Utilized During Treatment: Gait belt;Oxygen(6L) Nurse Communication: Mobility status  Activity Tolerance: Patient tolerated treatment well Patient left: in bed;with call bell/phone within reach;with bed alarm set  OT Visit Diagnosis: Unsteadiness on feet (R26.81);Other abnormalities of gait and mobility (R26.89);Muscle weakness (generalized) (M62.81);Pain Pain - part of body: (Generalized)                Time: 9093-1121 OT Time Calculation (min): 32 min Charges:  OT General Charges $OT Visit: 1 Visit OT Evaluation $OT Eval Moderate Complexity: [facility], OTR/L Acute Rehab Pager: [phone] Office: [place] 07/10/2019, 5:27 PM

## 2019-07-10 NOTE — Progress Notes (Signed)
Limited right carotid artery duplex completed. Refer to "CV Proc" under chart review to view preliminary results.  Preliminary results discussed with Burnetta Sabin, NP.  07/10/2019 10:01 AM Kelby Aline., MHA, RVT, RDCS, RDMS

## 2019-07-10 NOTE — Progress Notes (Signed)
eLink Physician-Brief Progress Note Patient Name: Stephen Copeland DOB: 11-09-1940 MRN: [id number]   Date of Service  07/10/2019  HPI/Events of Note  Patient intubated and ventilated. Notified of need for stress ulcer prophylaxis.  eICU Interventions  Will order: 1. Protonix IV.     Intervention Category Intermediate Interventions: Best-practice therapies (e.g. DVT, beta blocker, etc.)  [name] 07/10/2019, 6:30 AM

## 2019-07-10 NOTE — CV Procedure (Signed)
INR.  78 Y RT H M MRSS 2. LSW  1 am 5/5. New onset slurred speech,and lt sided weakness. CT brain . No ICH ASPECTS 6 to 7. CTA occluded RT ICA prox and RT MCA middle branch of trifurcatiobn. CTP core of 19 ml  With Tmax>6s 47m and mismatch of 37m Dr ArRory Percy/W the  patient endovascular revascularization  As the patient met the criteria for delayed revascularization given the imaging findings. Procedure ,reasons and alternatives were reviewed. Risk of ICH of 10 % with neurological  worsening and death discussed. Patient expressed understanding and provided informed consent. S.Alexiah Koroma MD

## 2019-07-10 NOTE — Evaluation (Signed)
Clinical/Bedside Swallow Evaluation Patient Details  Name: Stephen Copeland MRN: [id number] Date of Birth: 05/06/1940  Today's Date: 07/10/2019 Time: SLP Start Time (ACUTE ONLY): 1124 SLP Stop Time (ACUTE ONLY): 1134 SLP Time Calculation (min) (ACUTE ONLY): 10 min  Past Medical History:  Past Medical History:  Diagnosis Date  . Acid reflux   . CHF (congestive heart failure) ([place])   . Coronary artery disease   . Dilated cardiomyopathy ([place])   . Folliculitis   . Gastric ulcer   . Hiatal hernia   . Hyperlipidemia    a. pt is adamantly against statins.  . Hypertension   . Ischemia    a. Pt states he was diagnosed with "ischemia" in the 1990s but does not know further details, denies hx of heart blockage.  . Nummular dermatitis   . Prostate cancer ([place])   . Renal disorder   . Scoliosis   . Sinus bradycardia   . Stomach ulcer    a. remote ulcer in the 1990s, no hx of bleeding.   Past Surgical History:  Past Surgical History:  Procedure Laterality Date  . INGUINAL HERNIA REPAIR Left   . RADIOLOGY WITH ANESTHESIA N/A 07/09/2019   Procedure: IR WITH ANESTHESIA;  Surgeon: Luanne Bras, MD;  Location: [facility];  Service: Radiology;  Laterality: N/A;   HPI:  79 y.o. male PMH of Afib, systolic CHF, HLD, CAD, HTN, GERD, prostate ca, presenting to ER with complaints of left sided weakness. CT head acute ischemic right MCA territory infarct involving right insula and overlying supraganglionic R cerebral cortex.  Underwent Rt ICA stenting and Rt MCA thrombectomy 5/7.   Assessment / Plan / Recommendation Clinical Impression  Pt presents with an acute neurogenic dysphagia- there is CN involvement on left with asymmetry lower face and tongue deviation to left.  Pt's speech is dysarthric. Trials of ice chips and teaspoons water led to consistent cough response on 100% of trials, concerning for aspiration. Recommend continue NPO; D/W Rn.  Pt has cortrak.  SLP will follow for readiness for POs vs MBS  and pending speech/language evaluation.  SLP Visit Diagnosis: Dysphagia, oropharyngeal phase (R13.12)    Aspiration Risk       Diet Recommendation   npo; has cortrak  Medication Administration: Via alternative means    Other  Recommendations     Follow up Recommendations Other (comment)(tba)      Frequency and Duration min 3x week  2 weeks       Prognosis        Swallow Study   General Date of Onset: 07/10/19 HPI: 79 y.o. male PMH of Afib, systolic CHF, HLD, CAD, HTN, GERD, prostate ca, presenting to ER with complaints of left sided weakness. CT head acute ischemic right MCA territory infarct involving right insula and overlying supraganglionic R cerebral cortex.  Underwent Rt ICA stenting and Rt MCA thrombectomy 5/7. Type of Study: Bedside Swallow Evaluation Diet Prior to this Study: NPO Temperature Spikes Noted: No Respiratory Status: Nasal cannula History of Recent Intubation: Yes Length of Intubations (days): 1 days Date extubated: 07/10/19 Behavior/Cognition: Alert Oral Cavity Assessment: Within Functional Limits Oral Care Completed by SLP: Recent completion by staff Self-Feeding Abilities: Needs assist Patient Positioning: Partially reclined Baseline Vocal Quality: Hoarse Volitional Cough: Strong Volitional Swallow: Able to elicit    Oral/Motor/Sensory Function Overall Oral Motor/Sensory Function: Moderate impairment Facial ROM: Reduced left;Suspected CN VII (facial) dysfunction Facial Symmetry: Abnormal symmetry left;Suspected CN VII (facial) dysfunction Lingual Symmetry: Abnormal symmetry left;Suspected CN XII (hypoglossal)  dysfunction   Ice Chips Ice chips: Impaired Presentation: Spoon Pharyngeal Phase Impairments: Wet Vocal Quality;Throat Clearing - Immediate;Cough - Immediate   Thin Liquid Thin Liquid: Impaired Presentation: Spoon Oral Phase Functional Implications: Left anterior spillage Pharyngeal  Phase Impairments: Cough - Immediate    Nectar  Thick Nectar Thick Liquid: Not tested   Honey Thick Honey Thick Liquid: Not tested   Puree Puree: Not tested   Solid     Solid: Not tested      [name] 07/10/2019,12:45 PM   Estill Bamberg L. Tivis Ringer, [place] Office number [phone] Pager [phone]

## 2019-07-10 NOTE — Progress Notes (Signed)
Initial Nutrition Assessment  DOCUMENTATION CODES:   Not applicable  INTERVENTION:   Osmolite 1.5 @ 60 ml/hr via Cortrak tube 30 ml Prostat daily  Provides: 2260 kcal, 105 grams protein, and 1094 ml free water.    NUTRITION DIAGNOSIS:   Inadequate oral intake related to inability to eat as evidenced by NPO status.  GOAL:   Patient will meet greater than or equal to 90% of their needs  MONITOR:   TF tolerance, Labs  REASON FOR ASSESSMENT:   Consult Enteral/tube feeding initiation and management  ASSESSMENT:   Pt with PMH of CHF, CAD, cardiomyopathy, hiatal hernia, HLD, and HTN now admitted with R CVA s/p thrombectomy and carotid stent.   Pt discussed during ICU rounds and with RN.  Pt extubated; cortrak placed for meds and nutrition.  Pt able to communicate decreased appetite x 1 week but does not think he has lost weight. No family present.  On cleviprex but has started on po meds to taper cleviprex.   Medications reviewed and include: SSI, miralax  Cleviprex @ 36 ml/hr - provides: 1728 kcal  Labs reviewed: K+ 3.4 (L) CBG's: 120-130    NUTRITION - FOCUSED PHYSICAL EXAM:    Most Recent Value  Orbital Region  No depletion  Upper Arm Region  Mild depletion  Thoracic and Lumbar Region  No depletion  Buccal Region  No depletion  Temple Region  No depletion  Clavicle Bone Region  No depletion  Clavicle and Acromion Bone Region  Mild depletion  Scapular Bone Region  Unable to assess  Dorsal Hand  Unable to assess  Patellar Region  No depletion  Anterior Thigh Region  No depletion  Posterior Calf Region  No depletion  Edema (RD Assessment)  None  Hair  Reviewed  Eyes  Reviewed  Mouth  Reviewed  Skin  Reviewed  Nails  Unable to assess       Diet Order:   Diet Order            Diet NPO time specified  Diet effective now              EDUCATION NEEDS:   No education needs have been identified at this time  Skin:  Skin Assessment: Reviewed RN  Assessment  Last BM:  unknown  Height:   Ht Readings from Last 1 Encounters:  07/10/19 6' (1.829 m)    Weight:   Wt Readings from Last 1 Encounters:  07/10/19 84.3 kg    Ideal Body Weight:  80.9 kg  BMI:  Body mass index is 25.21 kg/m.  Estimated Nutritional Needs:   Kcal:  2100-2300  Protein:  100-115 grams  Fluid:  >2 L/day  Lockie Pares., RD, LDN, CNSC See AMi[facility] for contact information

## 2019-07-10 NOTE — Procedures (Signed)
Extubation Procedure Note  Patient Details:   Name: Stephen Copeland DOB: 1940-05-31 MRN: [id number]   Airway Documentation:    Vent end date: 07/10/19 Vent end time: 0840   Evaluation  O2 sats: stable throughout Complications: No apparent complications Patient did tolerate procedure well. Bilateral Breath Sounds: Clear, Diminished   Yes   Pt was extubated at 0840 per MD order. RT placed pt on 6L [place] with saturations of 93%. Pt was able to say his name and some words afterwards. Pt following some commands such as opening eyes and squeezing hands. Pt has complaints of being sore but otherwise seems comfortable at this time. No stridor was noted at this time. RT will continue to monitor pt status.   Arnav Cregg A Remonia Otte 07/10/2019, 8:47 AM

## 2019-07-10 NOTE — Progress Notes (Signed)
Pharmacy tech gave this nurse a note with concerns from the patient's wife.   Note says: -patient doesn't let wife help with medications -he was taking more meds than normal and that on 5/6 around 4-5 pm "took everything"  MD - please call wife tomorrow to update and clarify. She is screening calls and allows all calls to go to the answering machine, but she will pick up if you state you are from [facility].

## 2019-07-10 NOTE — Progress Notes (Signed)
OT Cancellation Note  Patient Details Name: Stephen Copeland MRN: [id number] DOB: 1940/06/14   Cancelled Treatment:    Reason Eval/Treat Not Completed: Patient at procedure or test/ unavailable(MRI. Will return as schedule allows. )  [place], OTR/L Acute Rehab Pager: [phone] Office: [phone] 07/10/2019, 1:30 PM

## 2019-07-10 NOTE — Social Work (Signed)
CSW was unable to complete sbirt due to pt being on the ventilator. CSW may attempt to complete at more appropriate time.   Shaquetta Arcos, LCSWA, LCASA Clinical Social Worker [phone]    

## 2019-07-10 NOTE — Progress Notes (Signed)
Echocardiogram 2D Echocardiogram has been performed.  [place] Deputy Elysabeth Aust 07/10/2019, 8:07 AM

## 2019-07-10 NOTE — Progress Notes (Signed)
STROKE TEAM PROGRESS NOTE   INTERVAL HISTORY RN is at bedside. Pt was extubated this am. Did not pass swallow screening. Now on cortrak for ASA and brilinta. Still has dysarthria and left hemiparesis. Pending MRI and MRA. CUS showed patent right ICA stent.   Vitals:   07/10/19 0830 07/10/19 0845 07/10/19 0900 07/10/19 0915  BP: (!) 105/58 125/65 135/78 136/64  Pulse: (!) 104 (!) 102 93 89  Resp: 18 14 17 13   Temp:      TempSrc:      SpO2: 95% 92% 93% 93%  Weight:      Height:        CBC:  Recent Labs  Lab 07/09/19 2031 07/09/19 2054 07/10/19 0208 07/10/19 0259  WBC 8.1  --   --  11.2*  NEUTROABS 5.4  --   --  9.4*  HGB 15.0   < > 11.6* 12.3*  HCT 45.8   < > 34.0* 38.0*  MCV 95.8  --   --  96.9  PLT 264  --   --  163   < > = values in this interval not displayed.    Basic Metabolic Panel:  Recent Labs  Lab 07/09/19 2031 07/09/19 2031 07/09/19 2054 07/09/19 2054 07/10/19 0208 07/10/19 0259  NA 143   < > 145   < > 143 141  K 3.8   < > 3.7   < > 3.3* 3.4*  CL 109   < > 110  --   --  112*  CO2 20*  --   --   --   --  15*  GLUCOSE 129*   < > 123*  --   --  176*  BUN 29*   < > 30*  --   --  24*  CREATININE 1.71*   < > 1.60*  --   --  1.56*  CALCIUM 9.9  --   --   --   --  8.3*   < > = values in this interval not displayed.   Lipid Panel:     Component Value Date/Time   CHOL 141 07/10/2019 0259   TRIG 111 07/10/2019 0259   TRIG 113 07/10/2019 0259   HDL 29 (L) 07/10/2019 0259   CHOLHDL 4.9 07/10/2019 0259   VLDL 22 07/10/2019 0259   LDLCALC 90 07/10/2019 0259   HgbA1c:  Lab Results  Component Value Date   HGBA1C 5.8 (H) 07/10/2019   Urine Drug Screen:     Component Value Date/Time   LABOPIA NONE DETECTED 07/10/2019 0121   COCAINSCRNUR NONE DETECTED 07/10/2019 0121   LABBENZ NONE DETECTED 07/10/2019 0121   AMPHETMU NONE DETECTED 07/10/2019 0121   THCU NONE DETECTED 07/10/2019 0121   LABBARB NONE DETECTED 07/10/2019 0121    Alcohol Level      Component Value Date/Time   ETH <10 07/10/2019 0259    IMAGING past 24 hours CT Code Stroke CTA Head W/WO contrast  Result Date: 07/09/2019 EXAM: CT ANGIOGRAPHY HEAD AND NECK CT PERFUSION BRAIN TECHNIQUE: Multidetector CT imaging of the head and neck was performed using the standard protocol during bolus administration of intravenous contrast. Multiplanar CT image reconstructions and MIPs were obtained to evaluate the vascular anatomy. Carotid stenosis measurements (when applicable) are obtained utilizing NASCET criteria, using the distal internal carotid diameter as the denominator. Multiphase CT imaging of the brain was performed following IV bolus contrast injection. Subsequent parametric perfusion maps were calculated using RAPID software. CONTRAST:  100 cc of Omni  350. COMPARISON:  None available. FINDINGS: CT HEAD FINDINGS Brain: Age-related cerebral atrophy with mild chronic small vessel ischemic disease. Patchy evolving hypodensity seen involving the right insula, with extension to involve the overlying supra ganglionic right cerebral cortex, consistent with evolving acute right MCA territory infarct. Relative sparing of the deep gray nuclei and internal capsule. No acute intracranial hemorrhage. No other acute large vessel territory infarct. No mass lesion or midline shift. No hydrocephalus or extra-axial fluid collection. Vascular: Asymmetric hyperdensity seen involving a proximal right M2 branch at the base of the right sylvian fissure, concerning for LV of (series 2, image 18). Skull: Scalp soft tissues and calvarium within normal limits. Sinuses/Orbits: Globes and orbital soft tissues within normal limits. Paranasal sinuses and mastoid air cells are clear. Other: None. ASPECTS ([place] Stroke Program Early CT Score) - Ganglionic level infarction (caudate, lentiform nuclei, internal capsule, insula, M1-M3 cortex): 6 - Supraganglionic infarction (M4-M6 cortex): 1 Total score (0-10 with 10 being  normal): 7 Review of the MIP images confirms the above findings CTA NECK FINDINGS Aortic arch: Visualized aortic arch normal in caliber with normal 3 vessel morphology. Mild-to-moderate scattered plaque about the arch and origin of the great vessels without high-grade stenosis. Atheromatous plaque at the origin of the right subclavian artery with short-segment 60% stenosis. Visualized subclavian arteries otherwise patent. Right carotid system: Right CCA mildly tortuous but widely patent to the bifurcation without stenosis. 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 A radiographic string sign is present. Plaque is predominantly hypodense in nature, suspected to reflect thrombus, and favored to be acute in nature, likely reflecting a ruptured plaque. Distally, right ICA is diminutive in caliber with markedly attenuated flow but remains grossly patent to the bifurcation. An underlying superimposed dissection could be present. Left carotid system: Left CCA patent from its origin to the bifurcation without flow-limiting stenosis. Bulky calcified plaque about the left bifurcation with relatively mild 30% stenosis by NASCET criteria. Left ICA mildly tortuous but otherwise widely patent distally to the skull base without stenosis, dissection or occlusion. Vertebral arteries: Both vertebral arteries arise from the subclavian arteries. Atheromatous plaque at the origin of the vertebral arteries with associated moderate approximate 50% stenosis bilaterally. Vertebral arteries tortuous but otherwise widely patent to the skull base without stenosis, dissection or occlusion. Skeleton: No acute osseous abnormality. No discrete or worrisome osseous lesions. Moderate multilevel cervical spondylosis, most pronounced at C5-6 and C6-7. Other neck: No other acute soft tissue abnormality within the neck. No mass lesion or adenopathy. Upper  chest: Visualized upper chest demonstrates no acute finding. Punctate calcified granuloma noted within the right upper lobe. Review of the MIP images confirms the above findings CTA HEAD FINDINGS Anterior circulation: Petrous left ICA widely patent. Mild atheromatous change within the cavernous/supraclinoid left ICA without flow-limiting stenosis. Faint opacification of the right ICA to the level of the terminus. Both A1 segments well perfused via collateral flow across the circle-of-Willis. Widely patent anterior communicating artery. Both ACAs well perfused and patent to their distal aspects. Right M1 segment perfused and widely patent. Normal right MCA bifurcation. There is abrupt occlusion of a proximal right M2 and/or M3 branch just distal to the right MCA bifurcation (series 7, image 64). This arises from the superior division. Remainder of the right MCA branches are perfused distally. Left M1 widely patent. Normal left MCA bifurcation. Distal left MCA branches well perfused without occlusion. Posterior circulation: Both vertebral  arteries widely patent to the vertebrobasilar junction without stenosis. Both picas patent. Basilar widely patent to its distal aspect without stenosis. Superior cerebral arteries patent bilaterally. Both PCAs primarily supplied via the basilar. Single short-segment moderate distal left P2 stenosis noted (series 8, image 14). PCAs otherwise widely patent to their distal aspects. Venous sinuses: Visualized major dural sinuses are grossly patent allowing for timing the contrast bolus. Anatomic variants: None significant. Review of the MIP images confirms the above findings CT Brain Perfusion Findings: ASPECTS: 7 CBF (<30%) Volume: 63mL Perfusion (Tmax>6.0s) volume: 62mL Mismatch Volume: 45mL Infarction Location:Acute core infarct seen involving the right insula and overlying supra ganglionic mid and posterior right frontal lobe. Mild surrounding ischemic penumbra. IMPRESSION: CT HEAD  IMPRESSION: 1. 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 No associated hemorrhage. 2. Hyperdense proximal right M2 and/or M3 vessel at the base of the right sylvian fissure, concerning for LVO. 3. Aspects = 7. CTA HEAD AND NECK IMPRESSION: 1. Positive CTA for large vessel occlusion. 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 Markedly attenuated and diminutive flow distally to the right ICA terminus. Possible superimposed dissection may be present. 2. Distal reconstitution of the right M1 segment via collateral flow across the circle-of-Willis but with distal embolic occlusion of a proximal right M2/M3 branch, superior division as above. 3. Atheromatous change about the left carotid bifurcation and left carotid siphon without high-grade stenosis. 4. 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 5. 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 CT PERFUSION IMPRESSION: Positive CT perfusion with 19 cc core infarct involving the right insula and overlying supra ganglionic right mid and posterior frontal region. 33 cc surrounding ischemic penumbra. Critical Value/emergent results were called by telephone at the time of interpretation on 07/09/2019 at 8:49 p.m. and again at 9:09 pm to provider [facility] , who verbally acknowledged these results. Electronically Signed   By: Jeannine Boga M.D.   On: 07/09/2019 21:41   DG Abd 1 View  Result Date: 07/10/2019 CLINICAL DATA:  Patient is for MRI. EXAM: ABDOMEN - 1 VIEW COMPARISON:  None. FINDINGS: The bowel gas pattern is normal. Distal tip of nasogastric tube is seen in proximal stomach. Residual contrast is seen in the intrarenal collecting systems bilaterally. No radiopaque metallic density is noted. No radio-opaque calculi or other significant radiographic  abnormality are seen. IMPRESSION: Distal tip of nasogastric tube seen in proximal stomach. No radiopaque metallic density is noted. Electronically Signed   By: Marijo Conception M.D.   On: 07/10/2019 08:35   CT HEAD WO CONTRAST  Result Date: 07/10/2019 CLINICAL DATA:  Stroke, follow-up. EXAM: CT HEAD WITHOUT CONTRAST TECHNIQUE: Contiguous axial images were obtained from the base of the skull through the vertex without intravenous contrast. COMPARISON:  CT head without contrast 07/09/2019. CT head without contrast performed in the angiography suite 07/10/2019. FINDINGS: Brain: Subarachnoid hemorrhage within the right sylvian fissure is again noted. Additional contrast or hemorrhage along the right side of the suprasellar cistern following the course of the right internal carotid artery has increased. Right-sided sulci are effaced. No midline shift is present. No significant mass effect is evident on the right lateral ventricle. White matter disease is stable. No significant extra-axial collections are present on the left. Brainstem and cerebellum are otherwise unremarkable. Vascular: Atherosclerotic calcifications are present within the cavernous internal carotid arteries bilaterally. Skull:  Calvarium is intact. No focal lytic or blastic lesions are present. No significant extracranial soft tissue lesion is present. Sinuses/Orbits: The paranasal sinuses and mastoid air cells are clear. Bilateral lens replacements are noted. Globes and orbits are otherwise unremarkable. IMPRESSION: 1. Subarachnoid hemorrhage within the right Sylvian fissure is again noted. 2. Additional contrast or hemorrhage along the right side of the suprasellar cistern following the course of the right internal carotid artery has increased. This is concerning for additional subarachnoid hemorrhage. 3. Some effacement of sulci over the right hemisphere without significant mass effect on the right lateral ventricle. 4. Stable diffuse white matter  disease. Electronically Signed   By: San Morelle M.D.   On: 07/10/2019 05:04   CT Code Stroke CTA Neck W/WO contrast  Result Date: 07/09/2019 EXAM: CT ANGIOGRAPHY HEAD AND NECK CT PERFUSION BRAIN TECHNIQUE: Multidetector CT imaging of the head and neck was performed using the standard protocol during bolus administration of intravenous contrast. Multiplanar CT image reconstructions and MIPs were obtained to evaluate the vascular anatomy. Carotid stenosis measurements (when applicable) are obtained utilizing NASCET criteria, using the distal internal carotid diameter as the denominator. Multiphase CT imaging of the brain was performed following IV bolus contrast injection. Subsequent parametric perfusion maps were calculated using RAPID software. CONTRAST:  100 cc of Omni 350. COMPARISON:  None available. FINDINGS: CT HEAD FINDINGS Brain: Age-related cerebral atrophy with mild chronic small vessel ischemic disease. Patchy evolving hypodensity seen involving the right insula, with extension to involve the overlying supra ganglionic right cerebral cortex, consistent with evolving acute right MCA territory infarct. Relative sparing of the deep gray nuclei and internal capsule. No acute intracranial hemorrhage. No other acute large vessel territory infarct. No mass lesion or midline shift. No hydrocephalus or extra-axial fluid collection. Vascular: Asymmetric hyperdensity seen involving a proximal right M2 branch at the base of the right sylvian fissure, concerning for LV of (series 2, image 18). Skull: Scalp soft tissues and calvarium within normal limits. Sinuses/Orbits: Globes and orbital soft tissues within normal limits. Paranasal sinuses and mastoid air cells are clear. Other: None. ASPECTS ([place] Stroke Program Early CT Score) - Ganglionic level infarction (caudate, lentiform nuclei, internal capsule, insula, M1-M3 cortex): 6 - Supraganglionic infarction (M4-M6 cortex): 1 Total score (0-10 with 10  being normal): 7 Review of the MIP images confirms the above findings CTA NECK FINDINGS Aortic arch: Visualized aortic arch normal in caliber with normal 3 vessel morphology. Mild-to-moderate scattered plaque about the arch and origin of the great vessels without high-grade stenosis. Atheromatous plaque at the origin of the right subclavian artery with short-segment 60% stenosis. Visualized subclavian arteries otherwise patent. Right carotid system: Right CCA mildly tortuous but widely patent to the bifurcation without stenosis. 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 A radiographic string sign is present. Plaque is predominantly hypodense in nature, suspected to reflect thrombus, and favored to be acute in nature, likely reflecting a ruptured plaque. Distally, right ICA is diminutive in caliber with markedly attenuated flow but remains grossly patent to the bifurcation. An underlying superimposed dissection could be present. Left carotid system: Left CCA patent from its origin to the bifurcation without flow-limiting stenosis. Bulky calcified plaque about the left bifurcation with relatively mild 30% stenosis by NASCET criteria. Left ICA mildly tortuous but otherwise widely patent distally to the skull base without stenosis, dissection or occlusion. Vertebral arteries: Both vertebral arteries arise from the subclavian arteries.  Atheromatous plaque at the origin of the vertebral arteries with associated moderate approximate 50% stenosis bilaterally. Vertebral arteries tortuous but otherwise widely patent to the skull base without stenosis, dissection or occlusion. Skeleton: No acute osseous abnormality. No discrete or worrisome osseous lesions. Moderate multilevel cervical spondylosis, most pronounced at C5-6 and C6-7. Other neck: No other acute soft tissue abnormality within the neck. No mass lesion or adenopathy.  Upper chest: Visualized upper chest demonstrates no acute finding. Punctate calcified granuloma noted within the right upper lobe. Review of the MIP images confirms the above findings CTA HEAD FINDINGS Anterior circulation: Petrous left ICA widely patent. Mild atheromatous change within the cavernous/supraclinoid left ICA without flow-limiting stenosis. Faint opacification of the right ICA to the level of the terminus. Both A1 segments well perfused via collateral flow across the circle-of-Willis. Widely patent anterior communicating artery. Both ACAs well perfused and patent to their distal aspects. Right M1 segment perfused and widely patent. Normal right MCA bifurcation. There is abrupt occlusion of a proximal right M2 and/or M3 branch just distal to the right MCA bifurcation (series 7, image 64). This arises from the superior division. Remainder of the right MCA branches are perfused distally. Left M1 widely patent. Normal left MCA bifurcation. Distal left MCA branches well perfused without occlusion. Posterior circulation: Both vertebral arteries widely patent to the vertebrobasilar junction without stenosis. Both picas patent. Basilar widely patent to its distal aspect without stenosis. Superior cerebral arteries patent bilaterally. Both PCAs primarily supplied via the basilar. Single short-segment moderate distal left P2 stenosis noted (series 8, image 14). PCAs otherwise widely patent to their distal aspects. Venous sinuses: Visualized major dural sinuses are grossly patent allowing for timing the contrast bolus. Anatomic variants: None significant. Review of the MIP images confirms the above findings CT Brain Perfusion Findings: ASPECTS: 7 CBF (<30%) Volume: 82mL Perfusion (Tmax>6.0s) volume: 52mL Mismatch Volume: 83mL Infarction Location:Acute core infarct seen involving the right insula and overlying supra ganglionic mid and posterior right frontal lobe. Mild surrounding ischemic penumbra. IMPRESSION: CT  HEAD IMPRESSION: 1. 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 No associated hemorrhage. 2. Hyperdense proximal right M2 and/or M3 vessel at the base of the right sylvian fissure, concerning for LVO. 3. Aspects = 7. CTA HEAD AND NECK IMPRESSION: 1. Positive CTA for large vessel occlusion. 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 Markedly attenuated and diminutive flow distally to the right ICA terminus. Possible superimposed dissection may be present. 2. Distal reconstitution of the right M1 segment via collateral flow across the circle-of-Willis but with distal embolic occlusion of a proximal right M2/M3 branch, superior division as above. 3. Atheromatous change about the left carotid bifurcation and left carotid siphon without high-grade stenosis. 4. 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 5. 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 CT PERFUSION IMPRESSION: Positive CT perfusion with 19 cc core infarct involving the right insula and overlying supra ganglionic right mid and posterior frontal region. 33 cc surrounding ischemic penumbra. Critical Value/emergent results were called by telephone at the time of interpretation on 07/09/2019 at 8:49 p.m. and again at 9:09 pm to provider [facility] , who verbally acknowledged these results. Electronically Signed   By: Jeannine Boga M.D.   On: 07/09/2019 21:41   CT Code Stroke Cerebral Perfusion with contrast  Result Date: 07/09/2019 EXAM: CT ANGIOGRAPHY HEAD AND  NECK CT PERFUSION BRAIN TECHNIQUE: Multidetector CT imaging of the head and neck was performed using the standard protocol during bolus administration of intravenous contrast. Multiplanar CT image reconstructions and MIPs were obtained to evaluate the vascular anatomy. Carotid stenosis  measurements (when applicable) are obtained utilizing NASCET criteria, using the distal internal carotid diameter as the denominator. Multiphase CT imaging of the brain was performed following IV bolus contrast injection. Subsequent parametric perfusion maps were calculated using RAPID software. CONTRAST:  100 cc of Omni 350. COMPARISON:  None available. FINDINGS: CT HEAD FINDINGS Brain: Age-related cerebral atrophy with mild chronic small vessel ischemic disease. Patchy evolving hypodensity seen involving the right insula, with extension to involve the overlying supra ganglionic right cerebral cortex, consistent with evolving acute right MCA territory infarct. Relative sparing of the deep gray nuclei and internal capsule. No acute intracranial hemorrhage. No other acute large vessel territory infarct. No mass lesion or midline shift. No hydrocephalus or extra-axial fluid collection. Vascular: Asymmetric hyperdensity seen involving a proximal right M2 branch at the base of the right sylvian fissure, concerning for LV of (series 2, image 18). Skull: Scalp soft tissues and calvarium within normal limits. Sinuses/Orbits: Globes and orbital soft tissues within normal limits. Paranasal sinuses and mastoid air cells are clear. Other: None. ASPECTS ([place] Stroke Program Early CT Score) - Ganglionic level infarction (caudate, lentiform nuclei, internal capsule, insula, M1-M3 cortex): 6 - Supraganglionic infarction (M4-M6 cortex): 1 Total score (0-10 with 10 being normal): 7 Review of the MIP images confirms the above findings CTA NECK FINDINGS Aortic arch: Visualized aortic arch normal in caliber with normal 3 vessel morphology. Mild-to-moderate scattered plaque about the arch and origin of the great vessels without high-grade stenosis. Atheromatous plaque at the origin of the right subclavian artery with short-segment 60% stenosis. Visualized subclavian arteries otherwise patent. Right carotid system: Right CCA mildly  tortuous but widely patent to the bifurcation without stenosis. 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 A radiographic string sign is present. Plaque is predominantly hypodense in nature, suspected to reflect thrombus, and favored to be acute in nature, likely reflecting a ruptured plaque. Distally, right ICA is diminutive in caliber with markedly attenuated flow but remains grossly patent to the bifurcation. An underlying superimposed dissection could be present. Left carotid system: Left CCA patent from its origin to the bifurcation without flow-limiting stenosis. Bulky calcified plaque about the left bifurcation with relatively mild 30% stenosis by NASCET criteria. Left ICA mildly tortuous but otherwise widely patent distally to the skull base without stenosis, dissection or occlusion. Vertebral arteries: Both vertebral arteries arise from the subclavian arteries. Atheromatous plaque at the origin of the vertebral arteries with associated moderate approximate 50% stenosis bilaterally. Vertebral arteries tortuous but otherwise widely patent to the skull base without stenosis, dissection or occlusion. Skeleton: No acute osseous abnormality. No discrete or worrisome osseous lesions. Moderate multilevel cervical spondylosis, most pronounced at C5-6 and C6-7. Other neck: No other acute soft tissue abnormality within the neck. No mass lesion or adenopathy. Upper chest: Visualized upper chest demonstrates no acute finding. Punctate calcified granuloma noted within the right upper lobe. Review of the MIP images confirms the above findings CTA HEAD FINDINGS Anterior circulation: Petrous left ICA widely patent. Mild atheromatous change within the cavernous/supraclinoid left ICA without flow-limiting stenosis. Faint opacification of the right ICA to the level of the terminus. Both A1 segments well perfused via collateral flow  across  the circle-of-Willis. Widely patent anterior communicating artery. Both ACAs well perfused and patent to their distal aspects. Right M1 segment perfused and widely patent. Normal right MCA bifurcation. There is abrupt occlusion of a proximal right M2 and/or M3 branch just distal to the right MCA bifurcation (series 7, image 64). This arises from the superior division. Remainder of the right MCA branches are perfused distally. Left M1 widely patent. Normal left MCA bifurcation. Distal left MCA branches well perfused without occlusion. Posterior circulation: Both vertebral arteries widely patent to the vertebrobasilar junction without stenosis. Both picas patent. Basilar widely patent to its distal aspect without stenosis. Superior cerebral arteries patent bilaterally. Both PCAs primarily supplied via the basilar. Single short-segment moderate distal left P2 stenosis noted (series 8, image 14). PCAs otherwise widely patent to their distal aspects. Venous sinuses: Visualized major dural sinuses are grossly patent allowing for timing the contrast bolus. Anatomic variants: None significant. Review of the MIP images confirms the above findings CT Brain Perfusion Findings: ASPECTS: 7 CBF (<30%) Volume: 52mL Perfusion (Tmax>6.0s) volume: 44mL Mismatch Volume: 38mL Infarction Location:Acute core infarct seen involving the right insula and overlying supra ganglionic mid and posterior right frontal lobe. Mild surrounding ischemic penumbra. IMPRESSION: CT HEAD IMPRESSION: 1. 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 No associated hemorrhage. 2. Hyperdense proximal right M2 and/or M3 vessel at the base of the right sylvian fissure, concerning for LVO. 3. Aspects = 7. CTA HEAD AND NECK IMPRESSION: 1. Positive CTA for large vessel occlusion. Probable ruptured plaque at the right carotid bifurcation with associated severe near  occlusive stenosis. Markedly attenuated and diminutive flow distally to the right ICA terminus. Possible superimposed dissection may be present. 2. Distal reconstitution of the right M1 segment via collateral flow across the circle-of-Willis but with distal embolic occlusion of a proximal right M2/M3 branch, superior division as above. 3. Atheromatous change about the left carotid bifurcation and left carotid siphon without high-grade stenosis. 4. 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 5. 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 CT PERFUSION IMPRESSION: Positive CT perfusion with 19 cc core infarct involving the right insula and overlying supra ganglionic right mid and posterior frontal region. 33 cc surrounding ischemic penumbra. Critical Value/emergent results were called by telephone at the time of interpretation on 07/09/2019 at 8:49 p.m. and again at 9:09 pm to provider [facility] , who verbally acknowledged these results. Electronically Signed   By: Jeannine Boga M.D.   On: 07/09/2019 21:41   DG Chest Port 1 View  Result Date: 07/10/2019 CLINICAL DATA:  79 year old male with stroke. EXAM: PORTABLE CHEST 1 VIEW COMPARISON:  Chest radiograph dated 12/18/2016. FINDINGS: Endotracheal tube approximately 6 cm above the carina. Enteric tube extends below the diaphragm with tip beyond the inferior margin of the image. The side port of the tube appears at the level of the GE junction. There is eventration of the right hemidiaphragm. Bibasilar atelectasis or infiltrate. No pneumothorax. There is mild cardiomegaly. Atherosclerotic calcification of the aorta. No acute osseous pathology. IMPRESSION: 1. Endotracheal tube above the carina. 2. Enteric tube with tip beyond the inferior margin of the image. 3. Bibasilar atelectasis or infiltrate. Electronically Signed   By: Anner Crete M.D.   On: 07/10/2019 02:37   CT HEAD CODE STROKE WO  CONTRAST  Result Date: 07/09/2019 EXAM: CT ANGIOGRAPHY HEAD AND NECK CT PERFUSION BRAIN TECHNIQUE: Multidetector CT imaging  of the head and neck was performed using the standard protocol during bolus administration of intravenous contrast. Multiplanar CT image reconstructions and MIPs were obtained to evaluate the vascular anatomy. Carotid stenosis measurements (when applicable) are obtained utilizing NASCET criteria, using the distal internal carotid diameter as the denominator. Multiphase CT imaging of the brain was performed following IV bolus contrast injection. Subsequent parametric perfusion maps were calculated using RAPID software. CONTRAST:  100 cc of Omni 350. COMPARISON:  None available. FINDINGS: CT HEAD FINDINGS Brain: Age-related cerebral atrophy with mild chronic small vessel ischemic disease. Patchy evolving hypodensity seen involving the right insula, with extension to involve the overlying supra ganglionic right cerebral cortex, consistent with evolving acute right MCA territory infarct. Relative sparing of the deep gray nuclei and internal capsule. No acute intracranial hemorrhage. No other acute large vessel territory infarct. No mass lesion or midline shift. No hydrocephalus or extra-axial fluid collection. Vascular: Asymmetric hyperdensity seen involving a proximal right M2 branch at the base of the right sylvian fissure, concerning for LV of (series 2, image 18). Skull: Scalp soft tissues and calvarium within normal limits. Sinuses/Orbits: Globes and orbital soft tissues within normal limits. Paranasal sinuses and mastoid air cells are clear. Other: None. ASPECTS ([place] Stroke Program Early CT Score) - Ganglionic level infarction (caudate, lentiform nuclei, internal capsule, insula, M1-M3 cortex): 6 - Supraganglionic infarction (M4-M6 cortex): 1 Total score (0-10 with 10 being normal): 7 Review of the MIP images confirms the above findings CTA NECK FINDINGS Aortic arch: Visualized aortic  arch normal in caliber with normal 3 vessel morphology. Mild-to-moderate scattered plaque about the arch and origin of the great vessels without high-grade stenosis. Atheromatous plaque at the origin of the right subclavian artery with short-segment 60% stenosis. Visualized subclavian arteries otherwise patent. Right carotid system: Right CCA mildly tortuous but widely patent to the bifurcation without stenosis. 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 A radiographic string sign is present. Plaque is predominantly hypodense in nature, suspected to reflect thrombus, and favored to be acute in nature, likely reflecting a ruptured plaque. Distally, right ICA is diminutive in caliber with markedly attenuated flow but remains grossly patent to the bifurcation. An underlying superimposed dissection could be present. Left carotid system: Left CCA patent from its origin to the bifurcation without flow-limiting stenosis. Bulky calcified plaque about the left bifurcation with relatively mild 30% stenosis by NASCET criteria. Left ICA mildly tortuous but otherwise widely patent distally to the skull base without stenosis, dissection or occlusion. Vertebral arteries: Both vertebral arteries arise from the subclavian arteries. Atheromatous plaque at the origin of the vertebral arteries with associated moderate approximate 50% stenosis bilaterally. Vertebral arteries tortuous but otherwise widely patent to the skull base without stenosis, dissection or occlusion. Skeleton: No acute osseous abnormality. No discrete or worrisome osseous lesions. Moderate multilevel cervical spondylosis, most pronounced at C5-6 and C6-7. Other neck: No other acute soft tissue abnormality within the neck. No mass lesion or adenopathy. Upper chest: Visualized upper chest demonstrates no acute finding. Punctate calcified granuloma noted within the right  upper lobe. Review of the MIP images confirms the above findings CTA HEAD FINDINGS Anterior circulation: Petrous left ICA widely patent. Mild atheromatous change within the cavernous/supraclinoid left ICA without flow-limiting stenosis. Faint opacification of the right ICA to the level of the terminus. Both A1 segments well perfused via collateral flow across the circle-of-Willis. Widely patent anterior communicating artery. Both ACAs  well perfused and patent to their distal aspects. Right M1 segment perfused and widely patent. Normal right MCA bifurcation. There is abrupt occlusion of a proximal right M2 and/or M3 branch just distal to the right MCA bifurcation (series 7, image 64). This arises from the superior division. Remainder of the right MCA branches are perfused distally. Left M1 widely patent. Normal left MCA bifurcation. Distal left MCA branches well perfused without occlusion. Posterior circulation: Both vertebral arteries widely patent to the vertebrobasilar junction without stenosis. Both picas patent. Basilar widely patent to its distal aspect without stenosis. Superior cerebral arteries patent bilaterally. Both PCAs primarily supplied via the basilar. Single short-segment moderate distal left P2 stenosis noted (series 8, image 14). PCAs otherwise widely patent to their distal aspects. Venous sinuses: Visualized major dural sinuses are grossly patent allowing for timing the contrast bolus. Anatomic variants: None significant. Review of the MIP images confirms the above findings CT Brain Perfusion Findings: ASPECTS: 7 CBF (<30%) Volume: 93mL Perfusion (Tmax>6.0s) volume: 63mL Mismatch Volume: 69mL Infarction Location:Acute core infarct seen involving the right insula and overlying supra ganglionic mid and posterior right frontal lobe. Mild surrounding ischemic penumbra. IMPRESSION: CT HEAD IMPRESSION: 1. Evolving acute ischemic right MCA territory infarct involving the right insula and overlying supra  ganglionic right cerebral cortex. No associated hemorrhage. 2. Hyperdense proximal right M2 and/or M3 vessel at the base of the right sylvian fissure, concerning for LVO. 3. Aspects = 7. CTA HEAD AND NECK IMPRESSION: 1. Positive CTA for large vessel occlusion. 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 Markedly attenuated and diminutive flow distally to the right ICA terminus. Possible superimposed dissection may be present. 2. Distal reconstitution of the right M1 segment via collateral flow across the circle-of-Willis but with distal embolic occlusion of a proximal right M2/M3 branch, superior division as above. 3. Atheromatous change about the left carotid bifurcation and left carotid siphon without high-grade stenosis. 4. 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 5. 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 CT PERFUSION IMPRESSION: Positive CT perfusion with 19 cc core infarct involving the right insula and overlying supra ganglionic right mid and posterior frontal region. 33 cc surrounding ischemic penumbra. Critical Value/emergent results were called by telephone at the time of interpretation on 07/09/2019 at 8:49 p.m. and again at 9:09 pm to provider [facility] , who verbally acknowledged these results. Electronically Signed   By: Jeannine Boga M.D.   On: 07/09/2019 21:41    PHYSICAL EXAM  Temp:  [97.5 F (36.4 C)-98 F (36.7 C)] 97.8 F (36.6 C) (05/07 0800) Pulse Rate:  [59-131] 89 (05/07 0915) Resp:  [11-28] 13 (05/07 0915) BP: (100-144)/(50-94) 136/64 (05/07 0915) SpO2:  [92 %-99 %] 93 % (05/07 0915) Arterial Line BP: (121-164)/(46-71) 137/57 (05/07 0915) FiO2 (%):  [40 %] 40 % (05/07 0724) Weight:  [84.3 kg-85 kg] 84.3 kg (05/07 0115)  General - Well nourished, well developed, in no apparent distress.  Ophthalmologic - fundi not visualized due to  noncooperation.  Cardiovascular - Regular rhythm and rate, but frequent PVCs on tele.  Neuro - Lethargic but orientation to time, place, and age were intact. Language including expression, naming, repetition, comprehension was assessed and found intact, however paucity of speech and moderate to severe dysarthria. Visual field full, no hemianopia. Gaze bilaterally without difficulty, PERRL. Tracking bilaterally. Left facial droop. Tongue midline. RUE 4/5 but LUE 3-/5 drift to bed within 10  second, finger grip 3/5. RLE 4/5 and LLE 3/5 proximal and 2/5 left foot DF/PF. DTR 1+ and no babinski. Sensation symmetrical. R FTN intact but slow, L FTN not able to test due to weakness. Gait not tested.    ASSESSMENT/PLAN Stephen Copeland is a 79 y.o. male with history of Afib, systolic CHF, HLD, CAD, HTN, prostate cancer presenting with left sided weakness, facial droop, dysarthria.   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 embolic secondary to known AF even on Eliquis  Code Stroke CT head evolving R MCA territory infarct (insula, cerebral cortex). hyperdense R M2 +/- M3. ASPECTS 7    CTA head & neck +LVO. Probable ruptured plaque R ICA bifurcation w/ near occlusive stenosis, possible superimposed dissection. Distal reconstitution R M1 from collaterals but w/ distal embolic occlusion proximal R M2/3 superior division branch. L ICA bifurcation and siphon atherosclerosis. B VA origins 50% stenoses. R subclavian artery origin 60% stenosis.   CT perfusion 19cc core infarct R insula and posterior frontal. 33cc penumbra.   Cerebral angio TICI3 revascularization occluded middle trifurcation R MCA branch, revascularization R ICA occlusion w/ angioplasty and stenting.   Post IR CT contrast stain R putamen and moderate R sylvian fissure, SAH + contrast.    CT head R sylvian fissure SAH.  Increased contrast vs hemorrhage R suprasellar cistern following ICA concerned for increased SAH. R sulci effacement.   Carotid Doppler  R ICA stent patent  MRI  Patchy R MCA infarcts. SAH R sylvian fissure. Small IVH.   MRA  R ICA patent.   2D Echo EF 35-40%. No source of embolus   LDL 90  HgbA1c 5.8  SCDs for VTE prophylaxis  Eliquis (apixaban) daily prior to admission, now on aspirin 81 mg daily and Brilinta (ticagrelor) 90 mg bid. Received during IR. Continue DAPT administration in setting of SAH given new ICA stent.   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 Hold AC given post IR SAH and need for DAPT post stent placment . Consider AC once SAH resolves.    Hypertension  Home meds:  Hydralazine 50 tid, metoprolol 25 bid  Resumed metoprolol 12.5 bid  Stable . BP goal 120-140 within 24h post IR . Long-term BP goal normotensive  Hyperlipidemia  Home meds:  No statin  LDL 90, goal < 70  May consider statin again ->pt adamantly has refused statins in the past  Cardiomyopathy  EF 30 to 40% since 2017  This admission EF 35 to 40%  CXR no pulmonary edema  Dysphagia . Secondary to stroke . NPO . Did not pass swallow eval . Speech on board . Cortrak placement    Other Stroke Risk Factors  Advanced age  Coronary artery disease  Chronic systolic Congestive heart failure  Other Active Problems  GERD on PPI   BPH on flomax PTA  Prostate cancer   Hypokalemia K 3.3->3.4 - supplement  AKI Cre 1.6->1.56. on IVF.   LBBB. Appears old. EKG neg ischemia. Trops 27->32  Mild leukocytosis, likely reactive WBC  11.2  Mild acute blood loss anemia Hb 12.3  Hospital day # 1  This patient is critically ill due to right MCA stroke, right ICA occlusion, status post thrombectomy and stenting, cardiomyopathy, CHF, dysphagia, A. fib and at significant risk of neurological worsening, death form recurrent stroke, hemorrhagic conversion, seizure, heart failure, aspiration. This patient's care requires constant monitoring of vital signs, hemodynamics, respiratory and cardiac monitoring, review of multiple databases, neurological assessment, discussion with family, other specialists and medical decision making of high complexity. I spent 40 minutes of neurocritical care time in the care of this patient.  I discussed with Dr. Charlene Brooke, MD PhD Stroke Neurology 07/10/2019 10:16 PM    To contact Stroke Continuity provider, please refer to [email or web address] After hours, contact General Neurology

## 2019-07-10 NOTE — Procedures (Signed)
S/P bilateral  Common carotid arteriograms followed by complete revascularization of occluded middle trifurcation branch of RT MCA with x 1 pass with 29mmx 40 mm solitaireX retriever and penumbra aspiration  Achieving a TICI 3 revascularization . S/P revascularization of symptomatic acute occlusion of prox Rt ICA with balloon angioplasty and stenting . Patient loaded with 81 mg of aspirin and 180 mg of brilinta via orogastric tube. Also received approx 10.5 mg of IA Integrelin . Post procedure CT reveals contrast stain in the RT putamen and moderate Rt sylvian fissure hyper density ,combination of SAH and contrast. No mass effect or ventricular enlargement. Patient left intubated for airway protection. RT CFA secured with an 49F angioseal closure device. Distal pulses palpable DP and PT bilaterally. S.Cope Marte MD

## 2019-07-10 NOTE — Transfer of Care (Signed)
Immediate Anesthesia Transfer of Care Note  Patient: Stephen Copeland  Procedure(s) Performed: IR WITH ANESTHESIA (N/A )  Patient Location: NICU  Anesthesia Type:General  Level of Consciousness: Patient remains intubated per anesthesia plan  Airway & Oxygen Therapy: Patient placed on Ventilator (see vital sign flow sheet for setting)  Post-op Assessment: Report given to RN and Post -op Vital signs reviewed and stable  Post vital signs: Reviewed and stable  Last Vitals:  Vitals Value Taken Time  BP 122/72 07/10/19 0101  Temp    Pulse 61 07/10/19 0105  Resp 17 07/10/19 0105  SpO2 97 % 07/10/19 0105  Vitals shown include unvalidated device data.  Last Pain:  Vitals:   07/09/19 2031  TempSrc: Oral         Complications: No apparent anesthesia complications

## 2019-07-10 NOTE — Evaluation (Signed)
Physical Therapy Evaluation Patient Details Name: Stephen Copeland MRN: [id number] DOB: [birth year] Today's Date: 07/10/2019   History of Present Illness  79 y.o. male admitted on 07/09/19 for L sided weakness, facial droop, and slurred speech.  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  He did not get any tPA as he was outside of the window.  Pt intubated 5/6 for procedure and extubated in the AM of 07/10/19.  Pt with other significant PMH of sinus brady, scoliosis, prostate CA s/p surgery, HTN, dilated cardiomyopathy, CAD, CHF.    Clinical Impression  PT/OT co session.  Pt is very restless and impulsive with decreased safety awareness and awareness of his deficits.  He was able to achieve partial standing for bed linen change with two person max assist.  He would benefit from intensive post acute rehab so that he may return home safely to his wife at d/c.   PT to follow acutely for deficits listed below.      Follow Up Recommendations CIR    Equipment Recommendations  Rolling walker with 5" wheels;Wheelchair (measurements PT);Wheelchair cushion (measurements PT);3in1 (PT)    Recommendations for Other Services Rehab consult     Precautions / Restrictions Precautions Precautions: Fall Precaution Comments: left sided weakness      Mobility  Bed Mobility Overal bed mobility: Needs Assistance Bed Mobility: Supine to Sit;Sit to Supine     Supine to sit: Mod assist;HOB elevated;+2 for physical assistance Sit to supine: +2 for physical assistance;Mod assist   General bed mobility comments: Pt needed mod assist to come up to sitting from maximally elevated HOB, pt needed assist at trunk and to help initiate movement of legs over the side of the bed.  Heavier assist at trunk to return to supine, but pt lifting his own legs.   Transfers Overall transfer level: Needs assistance Equipment used: 2 person hand held assist Transfers: Sit to/from Stand Sit to  Stand: Max assist;+2 physical assistance;From elevated surface         General transfer comment: Max two person physical assist for partial squat stand at EOB.  Pt unable to get fully upright standing.    Ambulation/Gait             General Gait Details: unable at this time.       Modified Rankin (Stroke Patients Only) Modified Rankin (Stroke Patients Only) Pre-Morbid Rankin Score: No symptoms Modified Rankin: Severe disability     Balance Overall balance assessment: Needs assistance Sitting-balance support: Feet supported;Bilateral upper extremity supported Sitting balance-Leahy Scale: Poor Sitting balance - Comments: Min assist EOB more for safety than for true balance.    Standing balance support: Bilateral upper extremity supported Standing balance-Leahy Scale: Poor Standing balance comment: needed significant two person assist to achieve partial stand (max)                             Pertinent Vitals/Pain Pain Assessment: Faces Faces Pain Scale: Hurts even more Pain Location: he has an unfortunate skin tear on his penis Pain Descriptors / Indicators: Nagging;Jabbing Pain Intervention(s): Limited activity within patient's tolerance;Monitored during session;Repositioned    Home Living Family/patient expects to be discharged to:: Private residence Living Arrangements: Spouse/significant other Available Help at Discharge: Family Type of Home: House           Additional Comments: Pt was a bit tangental, so we were unable to get a great home set up.  Prior Function Level of Independence: Independent               Hand Dominance   Dominant Hand: Right    Extremity/Trunk Assessment   Upper Extremity Assessment Upper Extremity Assessment: Defer to OT evaluation    Lower Extremity Assessment Lower Extremity Assessment: RLE deficits/detail;LLE deficits/detail RLE Deficits / Details: right leg is generally weak, but he can rais  against gravity, left leg is mildly weaker and he can raise it against partial gravity.   LLE Deficits / Details: right leg is generally weak, but he can rais against gravity, left leg is mildly weaker and he can raise it against partial gravity.      Cervical / Trunk Assessment Cervical / Trunk Assessment: Other exceptions Cervical / Trunk Exceptions: chart lists h/o scoliosis  Communication   Communication: Expressive difficulties(slurred)  Cognition Arousal/Alertness: Awake/alert Behavior During Therapy: Restless;Impulsive Overall Cognitive Status: Impaired/Different from baseline Area of Impairment: Safety/judgement;Awareness;Problem solving                         Safety/Judgement: Decreased awareness of safety;Decreased awareness of deficits Awareness: Intellectual Problem Solving: Difficulty sequencing;Requires verbal cues;Requires tactile cues General Comments: Pt with poor safety awareness and awareness of deficits.  He attempts to move before staff is ready for him to move, restless and talking near constantly.       General Comments General comments (skin integrity, edema, etc.): VSS even on RA during mobility, but since pt was returning to bed we placed 6 L O2 [place] back on his nose for sleeping.         Assessment/Plan    PT Assessment Patient needs continued PT services  PT Problem List Decreased strength;Decreased activity tolerance;Decreased balance;Decreased mobility;Decreased coordination;Decreased cognition;Decreased knowledge of use of DME;Decreased knowledge of precautions;Decreased safety awareness;Decreased skin integrity       PT Treatment Interventions DME instruction;Gait training;Stair training;Functional mobility training;Therapeutic activities;Therapeutic exercise;Balance training;Neuromuscular re-education;Cognitive remediation;Patient/family education;Wheelchair mobility training    PT Goals (Current goals can be found in the Care Plan  section)  Acute Rehab PT Goals Patient Stated Goal: none stated except he wants the foley catheter and cortrac removed.  PT Goal Formulation: With patient Time For Goal Achievement: 07/24/19 Potential to Achieve Goals: Good    Frequency Min 4X/week        Co-evaluation PT/OT/SLP Co-Evaluation/Treatment: Yes Reason for Co-Treatment: Complexity of the patient's impairments (multi-system involvement);Necessary to address cognition/behavior during functional activity;For patient/therapist safety;To address functional/ADL transfers PT goals addressed during session: Mobility/safety with mobility;Balance;Strengthening/ROM         AM-PAC PT "6 Clicks" Mobility  Outcome Measure Help needed turning from your back to your side while in a flat bed without using bedrails?: A Lot Help needed moving from lying on your back to sitting on the side of a flat bed without using bedrails?: A Lot Help needed moving to and from a bed to a chair (including a wheelchair)?: Total Help needed standing up from a chair using your arms (e.g., wheelchair or bedside chair)?: Total Help needed to walk in hospital room?: Total Help needed climbing 3-5 steps with a railing? : Total 6 Click Score: 8    End of Session   Activity Tolerance: Patient tolerated treatment well Patient left: in bed;with call bell/phone within reach;with bed alarm set Nurse Communication: Mobility status PT Visit Diagnosis: Muscle weakness (generalized) (M62.81);Difficulty in walking, not elsewhere classified (R26.2);Hemiplegia and hemiparesis Hemiplegia - Right/Left: Left Hemiplegia - dominant/non-dominant: Non-dominant Hemiplegia -  caused by: Cerebral infarction    Time: 1278-7183 PT Time Calculation (min) (ACUTE ONLY): 31 min   Charges:       Verdene Lennert, PT, DPT  Acute Rehabilitation [phone] pager #[phone] office     PT Evaluation $PT Eval Moderate Complexity: 1 Mod          07/10/2019, 5:02  PM

## 2019-07-10 NOTE — Progress Notes (Signed)
NAME:  Stephen Copeland, MRN:  [id number], DOB:  August 17, 1940, LOS: 1 ADMISSION DATE:  07/09/2019, CONSULTATION DATE:  5/6 REFERRING MD:  Dr Rory Percy, CHIEF COMPLAINT:  CVA  Brief History   79 year old admitted with right sided CVA and remained intubated post thrombectomy and carotid stent  He was outside the TPA window   Past Medical History    has a past medical history of Acid reflux, CHF (congestive heart failure) ([place]), Coronary artery disease, Dilated cardiomyopathy ([place]), Folliculitis, Gastric ulcer, Hiatal hernia, Hyperlipidemia, Hypertension, Ischemia, Nummular dermatitis, Prostate cancer ([place]), Renal disorder, Scoliosis, Sinus bradycardia, and Stomach ulcer.  Significant Hospital Events   5/6 admitted, to neuro IR, intubated post-op  Consults:  IR PCCM  Procedures:  Neuro IR 5/7: see Dr. Estanislado Pandy note. Rt ICA stend, thrombectomy Rt MCA  Significant Diagnostic Tests:  CT head 5/6 > Evolving acute ischemic right MCA territory infarct involving the right insula and overlying supra ganglionic right cerebral cortex. No associated hemorrhage. CTA head/neck 5/6 > Probable ruptured plaque at the right carotid bifurcation with associated severe near occlusive stenosis. Markedly attenuated and diminutive flow distally to the right ICA terminus. Possible superimposed dissection may be present. Distal reconstitution of the right M1 segment via collateral flow across the circle-of-Willis but with distal embolic occlusion of a proximal right M2/M3 branch  Micro Data:  Flu/Covid PCR neg  Antimicrobials:    Interim history/subjective:   Propofol turned off this morning, afebrile On Cleviprex drip  Objective   Blood pressure 136/64, pulse 89, temperature 97.8 F (36.6 C), temperature source Axillary, resp. rate 13, height 6' (1.829 m), weight 84.3 kg, SpO2 93 %.    Vent Mode: PSV;CPAP FiO2 (%):  [40 %] 40 % Set Rate:  [15 bmp] 15 bmp Vt Set:  [620 mL] 620 mL PEEP:  [5 cmH20] 5  cmH20 Pressure Support:  [5 cmH20] 5 cmH20 Plateau Pressure:  [13 cmH20] 13 cmH20   Intake/Output Summary (Last 24 hours) at 07/10/2019 0926 Last data filed at 07/10/2019 0800 Gross per 24 hour  Intake 2273.73 ml  Output 1075 ml  Net 1198.73 ml   Filed Weights   07/09/19 2035 07/10/19 0115  Weight: 85 kg 84.3 kg    Examination: General: Elderly male in NAD HENT: [place]/AT, Pupil pinpoint. Soft collar in place.  Lungs: Bilateral ventilated breath sounds Cardiovascular: RRR, no MRG Abdomen: Soft, non-tender, non-distended Extremities: No acute deformity Neuro: Follows one-step commands, decreased movement on left, cogwheel rigidity, and tremors  Resolved Hospital Problem list     Assessment & Plan:   Ischemic stroke: R ICA/ MCA. S/p Rt ICA stenting and Rt MCA thrombectomy 5/7 [facility] post procedural. Left carotid stent - Management per neurology - SBP goal 120-155mmHg  - Clevidipine/Phenylephrine PRN for BP goal - Repeat imaging per neuro.  -Aspirin and Brilinta for now, defer to neurology about resuming anticoagulation  Acute hypoxemic respiratory failure Possible aspiration -Past spontaneous breathing trials, proceed with extubation  Atrial fibrillation with RVR: AF history on eliquis. - Telemetry monitoring - Eliquis on hold - If remains AF will need heparin infusion when cleared by neurology - Consider BB if BP will tolerate   Chronic HFrEF: Dilated cardiomyopathy. LVEF 30-35% as of 06301 - Echo pending  -Resume metoprolol and hydralazine when able to take p.o. and taper off clevidipine  Best practice:  Diet: NPO Pain/Anxiety/Delirium protocol (if indicated): propofol for rass goal VAP protocol (if indicated): yes DVT prophylaxis: SCD GI prophylaxis: Protonix Glucose control: NA Mobility: BR  Code Status: FULL Family Communication: Per neurology Disposition: ICU   The patient is critically ill with multiple organ systems failure and requires high complexity  decision making for assessment and support, frequent evaluation and titration of therapies, application of advanced monitoring technologies and extensive interpretation of multiple databases. Critical Care Time devoted to patient care services described in this note independent of APP/resident  time is 31 minutes.   Kara Mead MD. [facility]. [facility]  If no response to pager , please call [phone]    07/10/2019 9:26 AM

## 2019-07-10 NOTE — Progress Notes (Signed)
RT and RN transported pt from 4N18 to CT and back without complication. Pts respiratory status remained stable throughout transport. RT will continue to monitor.

## 2019-07-10 NOTE — Anesthesia Preprocedure Evaluation (Signed)
Anesthesia Evaluation  Patient identified by MRN, date of birth, ID band Patient awake    Reviewed: Allergy & Precautions, NPO status , Patient's Chart, lab work & pertinent test results  Airway Mallampati: II  TM Distance: >3 FB     Dental   Pulmonary    breath sounds clear to auscultation       Cardiovascular hypertension, + CAD and +CHF   Rhythm:Regular Rate:Normal     Neuro/Psych    GI/Hepatic Neg liver ROS, hiatal hernia, PUD, GERD  ,  Endo/Other    Renal/GU Renal disease     Musculoskeletal   Abdominal   Peds  Hematology   Anesthesia Other Findings   Reproductive/Obstetrics                             Anesthesia Physical Anesthesia Plan  ASA: III  Anesthesia Plan: General   Post-op Pain Management:    Induction: Intravenous  PONV Risk Score and Plan: 2 and Ondansetron  Airway Management Planned: Oral ETT  Additional Equipment:   Intra-op Plan:   Post-operative Plan: Possible Post-op intubation/ventilation  Informed Consent:   Plan Discussed with: CRNA and Anesthesiologist  Anesthesia Plan Comments:         Anesthesia Quick Evaluation

## 2019-07-10 NOTE — Progress Notes (Signed)
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 Estanislado Pandy. Patient laying in bed resting comfortably. He responds to voice and follows simple commands. Can spontaneously move all extremities with weakness of left. Right groin incision c/d/i.   Allergies: Patient has no known allergies.  Medications: Prior to Admission medications   Medication Sig Start Date End Date Taking? Authorizing Provider  apixaban (ELIQUIS) 5 MG TABS tablet Take 1 tablet (5 mg total) by mouth 2 (two) times daily. 02/17/19   Burnell Blanks, MD  hydrALAZINE (APRESOLINE) 50 MG tablet TAKE 1 TABLET THREE TIMES A DAY (NEED FEBRUARY APPOINTMENT FOR FURTHER REFILLS THANKS ) 07/06/19   Burnell Blanks, MD  hydrocortisone cream 1 % Apply 1 application topically 2 (two) times daily.    [provider]  lidocaine (XYLOCAINE) 5 % ointment Apply 1 application topically as needed.    [provider]  metoprolol tartrate (LOPRESSOR) 25 MG tablet TAKE 1 TABLET TWICE A DAY 07/08/19   Burnell Blanks, MD  nitroGLYCERIN (NITROSTAT) 0.4 MG SL tablet Place under the tongue every 5 (five) minutes as needed for chest pain.  02/20/16   [provider]  omeprazole (PRILOSEC) 20 MG capsule Take 1 capsule (20 mg total) by mouth daily. 10/24/17   Burnell Blanks, MD  tamsulosin (FLOMAX) 0.4 MG CAPS capsule Take 0.4 mg by mouth daily after supper.  03/14/17   [provider]     Vital Signs: BP 136/64   Pulse 89   Temp 97.8 F (36.6 C) (Axillary)   Resp 13   Ht 6' (1.829 m) Comment: measured x 3 upon arrival  to unit  Wt 185 lb 13.6 oz (84.3 kg)   SpO2 93%   BMI 25.21 kg/m   Physical Exam Vitals and nursing note reviewed.  Constitutional:      General: He is not in acute distress. Pulmonary:     Effort: Pulmonary effort is normal. No respiratory distress.  Skin:    General: Skin is warm and dry.     Comments: Right groin incision soft without active bleeding or hematoma.  Neurological:     Mental Status: He is alert.     Comments: Alert and awake and follows simple commands. Speech and comprehension intact. Can spontaneously move all extremities with weakness of left. Distal pulses (DPs) 1+ bilaterally.     Imaging: CT Code Stroke CTA Head W/WO contrast  Result Date: 07/09/2019 EXAM: CT ANGIOGRAPHY HEAD AND NECK CT PERFUSION BRAIN TECHNIQUE: Multidetector CT imaging of the head and neck was performed using the standard protocol during bolus administration of intravenous contrast. Multiplanar CT image reconstructions and MIPs were obtained to evaluate the vascular anatomy. Carotid stenosis measurements (when applicable) are obtained utilizing NASCET criteria, using the distal internal carotid diameter as the denominator. Multiphase CT imaging of the brain was performed following IV bolus contrast injection. Subsequent parametric perfusion maps were calculated using RAPID software. CONTRAST:  100 cc of Omni 350. COMPARISON:  None available. FINDINGS: CT HEAD FINDINGS Brain: Age-related cerebral atrophy with mild chronic small vessel ischemic disease. Patchy evolving hypodensity seen involving the right insula, with extension to involve the overlying supra ganglionic right cerebral cortex,  consistent with evolving acute right MCA territory infarct. Relative sparing of the deep gray nuclei and internal capsule. No acute intracranial hemorrhage. No other acute large vessel territory infarct. No mass lesion or midline shift. No hydrocephalus or extra-axial fluid collection. Vascular: Asymmetric  hyperdensity seen involving a proximal right M2 branch at the base of the right sylvian fissure, concerning for LV of (series 2, image 18). Skull: Scalp soft tissues and calvarium within normal limits. Sinuses/Orbits: Globes and orbital soft tissues within normal limits. Paranasal sinuses and mastoid air cells are clear. Other: None. ASPECTS ([place] Stroke Program Early CT Score) - Ganglionic level infarction (caudate, lentiform nuclei, internal capsule, insula, M1-M3 cortex): 6 - Supraganglionic infarction (M4-M6 cortex): 1 Total score (0-10 with 10 being normal): 7 Review of the MIP images confirms the above findings CTA NECK FINDINGS Aortic arch: Visualized aortic arch normal in caliber with normal 3 vessel morphology. Mild-to-moderate scattered plaque about the arch and origin of the great vessels without high-grade stenosis. Atheromatous plaque at the origin of the right subclavian artery with short-segment 60% stenosis. Visualized subclavian arteries otherwise patent. Right carotid system: Right CCA mildly tortuous but widely patent to the bifurcation without stenosis. 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 A radiographic string sign is present. Plaque is predominantly hypodense in nature, suspected to reflect thrombus, and favored to be acute in nature, likely reflecting a ruptured plaque. Distally, right ICA is diminutive in caliber with markedly attenuated flow but remains grossly patent to the bifurcation. An underlying superimposed dissection could be present. Left carotid system: Left CCA patent from its origin to the bifurcation without flow-limiting stenosis. Bulky calcified plaque about the left bifurcation with relatively mild 30% stenosis by NASCET criteria. Left ICA mildly tortuous but otherwise widely patent distally to the skull base without stenosis, dissection or occlusion. Vertebral arteries:  Both vertebral arteries arise from the subclavian arteries. Atheromatous plaque at the origin of the vertebral arteries with associated moderate approximate 50% stenosis bilaterally. Vertebral arteries tortuous but otherwise widely patent to the skull base without stenosis, dissection or occlusion. Skeleton: No acute osseous abnormality. No discrete or worrisome osseous lesions. Moderate multilevel cervical spondylosis, most pronounced at C5-6 and C6-7. Other neck: No other acute soft tissue abnormality within the neck. No mass lesion or adenopathy. Upper chest: Visualized upper chest demonstrates no acute finding. Punctate calcified granuloma noted within the right upper lobe. Review of the MIP images confirms the above findings CTA HEAD FINDINGS Anterior circulation: Petrous left ICA widely patent. Mild atheromatous change within the cavernous/supraclinoid left ICA without flow-limiting stenosis. Faint opacification of the right ICA to the level of the terminus. Both A1 segments well perfused via collateral flow across the circle-of-Willis. Widely patent anterior communicating artery. Both ACAs well perfused and patent to their distal aspects. Right M1 segment perfused and widely patent. Normal right MCA bifurcation. There is abrupt occlusion of a proximal right M2 and/or M3 branch just distal to the right MCA bifurcation (series 7, image 64). This arises from the superior division. Remainder of the right MCA branches are perfused distally. Left M1 widely patent. Normal left MCA bifurcation. Distal left MCA branches well perfused without occlusion. Posterior circulation: Both vertebral arteries widely patent to the vertebrobasilar junction without stenosis. Both picas patent. Basilar widely patent to its distal aspect without stenosis. Superior cerebral arteries patent bilaterally. Both PCAs primarily supplied via the basilar. Single short-segment moderate distal left P2  stenosis noted (series 8, image 14). PCAs  otherwise widely patent to their distal aspects. Venous sinuses: Visualized major dural sinuses are grossly patent allowing for timing the contrast bolus. Anatomic variants: None significant. Review of the MIP images confirms the above findings CT Brain Perfusion Findings: ASPECTS: 7 CBF (<30%) Volume: 65mL Perfusion (Tmax>6.0s) volume: 101mL Mismatch Volume: 23mL Infarction Location:Acute core infarct seen involving the right insula and overlying supra ganglionic mid and posterior right frontal lobe. Mild surrounding ischemic penumbra. IMPRESSION: CT HEAD IMPRESSION: 1. 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 No associated hemorrhage. 2. Hyperdense proximal right M2 and/or M3 vessel at the base of the right sylvian fissure, concerning for LVO. 3. Aspects = 7. CTA HEAD AND NECK IMPRESSION: 1. Positive CTA for large vessel occlusion. 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 Markedly attenuated and diminutive flow distally to the right ICA terminus. Possible superimposed dissection may be present. 2. Distal reconstitution of the right M1 segment via collateral flow across the circle-of-Willis but with distal embolic occlusion of a proximal right M2/M3 branch, superior division as above. 3. Atheromatous change about the left carotid bifurcation and left carotid siphon without high-grade stenosis. 4. 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 5. 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 CT PERFUSION IMPRESSION: Positive CT perfusion with 19 cc core infarct involving the right insula and overlying supra ganglionic right mid and posterior frontal region. 33 cc surrounding ischemic penumbra. Critical Value/emergent results were called by telephone at the time of interpretation on 07/09/2019 at 8:49 p.m. and again at  9:09 pm to provider [facility] , who verbally acknowledged these results. Electronically Signed   By: Jeannine Boga M.D.   On: 07/09/2019 21:41   DG Abd 1 View  Result Date: 07/10/2019 CLINICAL DATA:  Patient is for MRI. EXAM: ABDOMEN - 1 VIEW COMPARISON:  None. FINDINGS: The bowel gas pattern is normal. Distal tip of nasogastric tube is seen in proximal stomach. Residual contrast is seen in the intrarenal collecting systems bilaterally. No radiopaque metallic density is noted. No radio-opaque calculi or other significant radiographic abnormality are seen. IMPRESSION: Distal tip of nasogastric tube seen in proximal stomach. No radiopaque metallic density is noted. Electronically Signed   By: Marijo Conception M.D.   On: 07/10/2019 08:35   CT HEAD WO CONTRAST  Result Date: 07/10/2019 CLINICAL DATA:  Stroke, follow-up. EXAM: CT HEAD WITHOUT CONTRAST TECHNIQUE: Contiguous axial images were obtained from the base of the skull through the vertex without intravenous contrast. COMPARISON:  CT head without contrast 07/09/2019. CT head without contrast performed in the angiography suite 07/10/2019. FINDINGS: Brain: Subarachnoid hemorrhage within the right sylvian fissure is again noted. Additional contrast or hemorrhage along the right side of the suprasellar cistern following the course of the right internal carotid artery has increased. Right-sided sulci are effaced. No midline shift is present. No significant mass effect is evident on the right lateral ventricle. White matter disease is stable. No significant extra-axial collections are present on the left. Brainstem and cerebellum are otherwise unremarkable. Vascular: Atherosclerotic calcifications are present within the cavernous internal carotid arteries bilaterally. Skull: Calvarium is intact. No focal lytic or blastic lesions are present. No significant extracranial soft tissue lesion is present. Sinuses/Orbits: The paranasal sinuses and mastoid air  cells are clear. Bilateral lens replacements are noted. Globes and orbits are  otherwise unremarkable. IMPRESSION: 1. Subarachnoid hemorrhage within the right Sylvian fissure is again noted. 2. Additional contrast or hemorrhage along the right side of the suprasellar cistern following the course of the right internal carotid artery has increased. This is concerning for additional subarachnoid hemorrhage. 3. Some effacement of sulci over the right hemisphere without significant mass effect on the right lateral ventricle. 4. Stable diffuse white matter disease. Electronically Signed   By: San Morelle M.D.   On: 07/10/2019 05:04   CT Code Stroke CTA Neck W/WO contrast  Result Date: 07/09/2019 EXAM: CT ANGIOGRAPHY HEAD AND NECK CT PERFUSION BRAIN TECHNIQUE: Multidetector CT imaging of the head and neck was performed using the standard protocol during bolus administration of intravenous contrast. Multiplanar CT image reconstructions and MIPs were obtained to evaluate the vascular anatomy. Carotid stenosis measurements (when applicable) are obtained utilizing NASCET criteria, using the distal internal carotid diameter as the denominator. Multiphase CT imaging of the brain was performed following IV bolus contrast injection. Subsequent parametric perfusion maps were calculated using RAPID software. CONTRAST:  100 cc of Omni 350. COMPARISON:  None available. FINDINGS: CT HEAD FINDINGS Brain: Age-related cerebral atrophy with mild chronic small vessel ischemic disease. Patchy evolving hypodensity seen involving the right insula, with extension to involve the overlying supra ganglionic right cerebral cortex, consistent with evolving acute right MCA territory infarct. Relative sparing of the deep gray nuclei and internal capsule. No acute intracranial hemorrhage. No other acute large vessel territory infarct. No mass lesion or midline shift. No hydrocephalus or extra-axial fluid collection. Vascular: Asymmetric  hyperdensity seen involving a proximal right M2 branch at the base of the right sylvian fissure, concerning for LV of (series 2, image 18). Skull: Scalp soft tissues and calvarium within normal limits. Sinuses/Orbits: Globes and orbital soft tissues within normal limits. Paranasal sinuses and mastoid air cells are clear. Other: None. ASPECTS ([place] Stroke Program Early CT Score) - Ganglionic level infarction (caudate, lentiform nuclei, internal capsule, insula, M1-M3 cortex): 6 - Supraganglionic infarction (M4-M6 cortex): 1 Total score (0-10 with 10 being normal): 7 Review of the MIP images confirms the above findings CTA NECK FINDINGS Aortic arch: Visualized aortic arch normal in caliber with normal 3 vessel morphology. Mild-to-moderate scattered plaque about the arch and origin of the great vessels without high-grade stenosis. Atheromatous plaque at the origin of the right subclavian artery with short-segment 60% stenosis. Visualized subclavian arteries otherwise patent. Right carotid system: Right CCA mildly tortuous but widely patent to the bifurcation without stenosis. 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 A radiographic string sign is present. Plaque is predominantly hypodense in nature, suspected to reflect thrombus, and favored to be acute in nature, likely reflecting a ruptured plaque. Distally, right ICA is diminutive in caliber with markedly attenuated flow but remains grossly patent to the bifurcation. An underlying superimposed dissection could be present. Left carotid system: Left CCA patent from its origin to the bifurcation without flow-limiting stenosis. Bulky calcified plaque about the left bifurcation with relatively mild 30% stenosis by NASCET criteria. Left ICA mildly tortuous but otherwise widely patent distally to the skull base without stenosis, dissection or occlusion. Vertebral arteries:  Both vertebral arteries arise from the subclavian arteries. Atheromatous plaque at the origin of the vertebral arteries with associated moderate approximate 50% stenosis bilaterally. Vertebral arteries tortuous but otherwise widely patent to the skull base without stenosis, dissection or occlusion. Skeleton: No acute osseous abnormality. No  discrete or worrisome osseous lesions. Moderate multilevel cervical spondylosis, most pronounced at C5-6 and C6-7. Other neck: No other acute soft tissue abnormality within the neck. No mass lesion or adenopathy. Upper chest: Visualized upper chest demonstrates no acute finding. Punctate calcified granuloma noted within the right upper lobe. Review of the MIP images confirms the above findings CTA HEAD FINDINGS Anterior circulation: Petrous left ICA widely patent. Mild atheromatous change within the cavernous/supraclinoid left ICA without flow-limiting stenosis. Faint opacification of the right ICA to the level of the terminus. Both A1 segments well perfused via collateral flow across the circle-of-Willis. Widely patent anterior communicating artery. Both ACAs well perfused and patent to their distal aspects. Right M1 segment perfused and widely patent. Normal right MCA bifurcation. There is abrupt occlusion of a proximal right M2 and/or M3 branch just distal to the right MCA bifurcation (series 7, image 64). This arises from the superior division. Remainder of the right MCA branches are perfused distally. Left M1 widely patent. Normal left MCA bifurcation. Distal left MCA branches well perfused without occlusion. Posterior circulation: Both vertebral arteries widely patent to the vertebrobasilar junction without stenosis. Both picas patent. Basilar widely patent to its distal aspect without stenosis. Superior cerebral arteries patent bilaterally. Both PCAs primarily supplied via the basilar. Single short-segment moderate distal left P2 stenosis noted (series 8, image 14). PCAs  otherwise widely patent to their distal aspects. Venous sinuses: Visualized major dural sinuses are grossly patent allowing for timing the contrast bolus. Anatomic variants: None significant. Review of the MIP images confirms the above findings CT Brain Perfusion Findings: ASPECTS: 7 CBF (<30%) Volume: 71mL Perfusion (Tmax>6.0s) volume: 45mL Mismatch Volume: 83mL Infarction Location:Acute core infarct seen involving the right insula and overlying supra ganglionic mid and posterior right frontal lobe. Mild surrounding ischemic penumbra. IMPRESSION: CT HEAD IMPRESSION: 1. 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 No associated hemorrhage. 2. Hyperdense proximal right M2 and/or M3 vessel at the base of the right sylvian fissure, concerning for LVO. 3. Aspects = 7. CTA HEAD AND NECK IMPRESSION: 1. Positive CTA for large vessel occlusion. 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 Markedly attenuated and diminutive flow distally to the right ICA terminus. Possible superimposed dissection may be present. 2. Distal reconstitution of the right M1 segment via collateral flow across the circle-of-Willis but with distal embolic occlusion of a proximal right M2/M3 branch, superior division as above. 3. Atheromatous change about the left carotid bifurcation and left carotid siphon without high-grade stenosis. 4. 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 5. 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 CT PERFUSION IMPRESSION: Positive CT perfusion with 19 cc core infarct involving the right insula and overlying supra ganglionic right mid and posterior frontal region. 33 cc surrounding ischemic penumbra. Critical Value/emergent results were called by telephone at the time of interpretation on 07/09/2019 at 8:49 p.m. and again at  9:09 pm to provider [facility] , who verbally acknowledged these results. Electronically Signed   By: Jeannine Boga M.D.   On: 07/09/2019 21:41   CT Code Stroke Cerebral Perfusion with contrast  Result Date: 07/09/2019 EXAM: CT ANGIOGRAPHY HEAD AND NECK CT PERFUSION BRAIN TECHNIQUE: Multidetector CT imaging of the head and neck was performed using the standard protocol during bolus administration of intravenous contrast. Multiplanar CT image reconstructions and MIPs were obtained to evaluate the vascular anatomy.  Carotid stenosis measurements (when applicable) are obtained utilizing NASCET criteria, using the distal internal carotid diameter as the denominator. Multiphase CT imaging of the brain was performed following IV bolus contrast injection. Subsequent parametric perfusion maps were calculated using RAPID software. CONTRAST:  100 cc of Omni 350. COMPARISON:  None available. FINDINGS: CT HEAD FINDINGS Brain: Age-related cerebral atrophy with mild chronic small vessel ischemic disease. Patchy evolving hypodensity seen involving the right insula, with extension to involve the overlying supra ganglionic right cerebral cortex, consistent with evolving acute right MCA territory infarct. Relative sparing of the deep gray nuclei and internal capsule. No acute intracranial hemorrhage. No other acute large vessel territory infarct. No mass lesion or midline shift. No hydrocephalus or extra-axial fluid collection. Vascular: Asymmetric hyperdensity seen involving a proximal right M2 branch at the base of the right sylvian fissure, concerning for LV of (series 2, image 18). Skull: Scalp soft tissues and calvarium within normal limits. Sinuses/Orbits: Globes and orbital soft tissues within normal limits. Paranasal sinuses and mastoid air cells are clear. Other: None. ASPECTS ([place] Stroke Program Early CT Score) - Ganglionic level infarction (caudate, lentiform nuclei, internal capsule, insula, M1-M3 cortex):  6 - Supraganglionic infarction (M4-M6 cortex): 1 Total score (0-10 with 10 being normal): 7 Review of the MIP images confirms the above findings CTA NECK FINDINGS Aortic arch: Visualized aortic arch normal in caliber with normal 3 vessel morphology. Mild-to-moderate scattered plaque about the arch and origin of the great vessels without high-grade stenosis. Atheromatous plaque at the origin of the right subclavian artery with short-segment 60% stenosis. Visualized subclavian arteries otherwise patent. Right carotid system: Right CCA mildly tortuous but widely patent to the bifurcation without stenosis. 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 A radiographic string sign is present. Plaque is predominantly hypodense in nature, suspected to reflect thrombus, and favored to be acute in nature, likely reflecting a ruptured plaque. Distally, right ICA is diminutive in caliber with markedly attenuated flow but remains grossly patent to the bifurcation. An underlying superimposed dissection could be present. Left carotid system: Left CCA patent from its origin to the bifurcation without flow-limiting stenosis. Bulky calcified plaque about the left bifurcation with relatively mild 30% stenosis by NASCET criteria. Left ICA mildly tortuous but otherwise widely patent distally to the skull base without stenosis, dissection or occlusion. Vertebral arteries: Both vertebral arteries arise from the subclavian arteries. Atheromatous plaque at the origin of the vertebral arteries with associated moderate approximate 50% stenosis bilaterally. Vertebral arteries tortuous but otherwise widely patent to the skull base without stenosis, dissection or occlusion. Skeleton: No acute osseous abnormality. No discrete or worrisome osseous lesions. Moderate multilevel cervical spondylosis, most pronounced at C5-6 and C6-7. Other neck: No other  acute soft tissue abnormality within the neck. No mass lesion or adenopathy. Upper chest: Visualized upper chest demonstrates no acute finding. Punctate calcified granuloma noted within the right upper lobe. Review of the MIP images confirms the above findings CTA HEAD FINDINGS Anterior circulation: Petrous left ICA widely patent. Mild atheromatous change within the cavernous/supraclinoid left ICA without flow-limiting stenosis. Faint opacification of the right ICA to the level of the terminus. Both A1 segments well perfused via collateral flow across the circle-of-Willis. Widely patent anterior communicating artery. Both ACAs well perfused and patent to their distal aspects. Right M1 segment perfused and widely patent. Normal right MCA bifurcation. There is abrupt occlusion of a proximal right M2 and/or M3  branch just distal to the right MCA bifurcation (series 7, image 64). This arises from the superior division. Remainder of the right MCA branches are perfused distally. Left M1 widely patent. Normal left MCA bifurcation. Distal left MCA branches well perfused without occlusion. Posterior circulation: Both vertebral arteries widely patent to the vertebrobasilar junction without stenosis. Both picas patent. Basilar widely patent to its distal aspect without stenosis. Superior cerebral arteries patent bilaterally. Both PCAs primarily supplied via the basilar. Single short-segment moderate distal left P2 stenosis noted (series 8, image 14). PCAs otherwise widely patent to their distal aspects. Venous sinuses: Visualized major dural sinuses are grossly patent allowing for timing the contrast bolus. Anatomic variants: None significant. Review of the MIP images confirms the above findings CT Brain Perfusion Findings: ASPECTS: 7 CBF (<30%) Volume: 70mL Perfusion (Tmax>6.0s) volume: 28mL Mismatch Volume: 52mL Infarction Location:Acute core infarct seen involving the right insula and overlying supra ganglionic mid and  posterior right frontal lobe. Mild surrounding ischemic penumbra. IMPRESSION: CT HEAD IMPRESSION: 1. 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 No associated hemorrhage. 2. Hyperdense proximal right M2 and/or M3 vessel at the base of the right sylvian fissure, concerning for LVO. 3. Aspects = 7. CTA HEAD AND NECK IMPRESSION: 1. Positive CTA for large vessel occlusion. 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 Markedly attenuated and diminutive flow distally to the right ICA terminus. Possible superimposed dissection may be present. 2. Distal reconstitution of the right M1 segment via collateral flow across the circle-of-Willis but with distal embolic occlusion of a proximal right M2/M3 branch, superior division as above. 3. Atheromatous change about the left carotid bifurcation and left carotid siphon without high-grade stenosis. 4. 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 5. 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 CT PERFUSION IMPRESSION: Positive CT perfusion with 19 cc core infarct involving the right insula and overlying supra ganglionic right mid and posterior frontal region. 33 cc surrounding ischemic penumbra. Critical Value/emergent results were called by telephone at the time of interpretation on 07/09/2019 at 8:49 p.m. and again at 9:09 pm to provider [facility] , who verbally acknowledged these results. Electronically Signed   By: Jeannine Boga M.D.   On: 07/09/2019 21:41   DG Chest Port 1 View  Result Date: 07/10/2019 CLINICAL DATA:  79 year old male with stroke. EXAM: PORTABLE CHEST 1 VIEW COMPARISON:  Chest radiograph dated 12/18/2016. FINDINGS: Endotracheal tube approximately 6 cm above the carina. Enteric tube extends below the diaphragm with tip beyond the inferior margin of the  image. The side port of the tube appears at the level of the GE junction. There is eventration of the right hemidiaphragm. Bibasilar atelectasis or infiltrate. No pneumothorax. There is mild cardiomegaly. Atherosclerotic calcification of the aorta. No acute osseous pathology. IMPRESSION: 1. Endotracheal tube above the carina. 2. Enteric tube with tip beyond the inferior margin of the image. 3. Bibasilar atelectasis or infiltrate. Electronically Signed   By: Anner Crete M.D.   On: 07/10/2019 02:37   CT HEAD CODE STROKE WO CONTRAST  Result Date: 07/09/2019 EXAM: CT ANGIOGRAPHY HEAD AND NECK CT PERFUSION BRAIN TECHNIQUE: Multidetector CT imaging of the head and neck was performed using the standard protocol during bolus administration of intravenous contrast. Multiplanar CT image reconstructions and MIPs were obtained to evaluate the vascular anatomy. Carotid stenosis measurements (when applicable) are obtained utilizing NASCET  criteria, using the distal internal carotid diameter as the denominator. Multiphase CT imaging of the brain was performed following IV bolus contrast injection. Subsequent parametric perfusion maps were calculated using RAPID software. CONTRAST:  100 cc of Omni 350. COMPARISON:  None available. FINDINGS: CT HEAD FINDINGS Brain: Age-related cerebral atrophy with mild chronic small vessel ischemic disease. Patchy evolving hypodensity seen involving the right insula, with extension to involve the overlying supra ganglionic right cerebral cortex, consistent with evolving acute right MCA territory infarct. Relative sparing of the deep gray nuclei and internal capsule. No acute intracranial hemorrhage. No other acute large vessel territory infarct. No mass lesion or midline shift. No hydrocephalus or extra-axial fluid collection. Vascular: Asymmetric hyperdensity seen involving a proximal right M2 branch at the base of the right sylvian fissure, concerning for LV of (series 2, image 18).  Skull: Scalp soft tissues and calvarium within normal limits. Sinuses/Orbits: Globes and orbital soft tissues within normal limits. Paranasal sinuses and mastoid air cells are clear. Other: None. ASPECTS ([place] Stroke Program Early CT Score) - Ganglionic level infarction (caudate, lentiform nuclei, internal capsule, insula, M1-M3 cortex): 6 - Supraganglionic infarction (M4-M6 cortex): 1 Total score (0-10 with 10 being normal): 7 Review of the MIP images confirms the above findings CTA NECK FINDINGS Aortic arch: Visualized aortic arch normal in caliber with normal 3 vessel morphology. Mild-to-moderate scattered plaque about the arch and origin of the great vessels without high-grade stenosis. Atheromatous plaque at the origin of the right subclavian artery with short-segment 60% stenosis. Visualized subclavian arteries otherwise patent. Right carotid system: Right CCA mildly tortuous but widely patent to the bifurcation without stenosis. 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 A radiographic string sign is present. Plaque is predominantly hypodense in nature, suspected to reflect thrombus, and favored to be acute in nature, likely reflecting a ruptured plaque. Distally, right ICA is diminutive in caliber with markedly attenuated flow but remains grossly patent to the bifurcation. An underlying superimposed dissection could be present. Left carotid system: Left CCA patent from its origin to the bifurcation without flow-limiting stenosis. Bulky calcified plaque about the left bifurcation with relatively mild 30% stenosis by NASCET criteria. Left ICA mildly tortuous but otherwise widely patent distally to the skull base without stenosis, dissection or occlusion. Vertebral arteries: Both vertebral arteries arise from the subclavian arteries. Atheromatous plaque at the origin of the vertebral arteries with associated  moderate approximate 50% stenosis bilaterally. Vertebral arteries tortuous but otherwise widely patent to the skull base without stenosis, dissection or occlusion. Skeleton: No acute osseous abnormality. No discrete or worrisome osseous lesions. Moderate multilevel cervical spondylosis, most pronounced at C5-6 and C6-7. Other neck: No other acute soft tissue abnormality within the neck. No mass lesion or adenopathy. Upper chest: Visualized upper chest demonstrates no acute finding. Punctate calcified granuloma noted within the right upper lobe. Review of the MIP images confirms the above findings CTA HEAD FINDINGS Anterior circulation: Petrous left ICA widely patent. Mild atheromatous change within the cavernous/supraclinoid left ICA without flow-limiting stenosis. Faint opacification of the right ICA to the level of the terminus. Both A1 segments well perfused via collateral flow across the circle-of-Willis. Widely patent anterior communicating artery. Both ACAs well perfused and patent to their distal aspects. Right M1 segment perfused and widely patent. Normal right MCA bifurcation. There is abrupt occlusion of a proximal right M2 and/or M3 branch just distal to the right MCA bifurcation (series  7, image 64). This arises from the superior division. Remainder of the right MCA branches are perfused distally. Left M1 widely patent. Normal left MCA bifurcation. Distal left MCA branches well perfused without occlusion. Posterior circulation: Both vertebral arteries widely patent to the vertebrobasilar junction without stenosis. Both picas patent. Basilar widely patent to its distal aspect without stenosis. Superior cerebral arteries patent bilaterally. Both PCAs primarily supplied via the basilar. Single short-segment moderate distal left P2 stenosis noted (series 8, image 14). PCAs otherwise widely patent to their distal aspects. Venous sinuses: Visualized major dural sinuses are grossly patent allowing for timing  the contrast bolus. Anatomic variants: None significant. Review of the MIP images confirms the above findings CT Brain Perfusion Findings: ASPECTS: 7 CBF (<30%) Volume: 75mL Perfusion (Tmax>6.0s) volume: 12mL Mismatch Volume: 87mL Infarction Location:Acute core infarct seen involving the right insula and overlying supra ganglionic mid and posterior right frontal lobe. Mild surrounding ischemic penumbra. IMPRESSION: CT HEAD IMPRESSION: 1. 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 No associated hemorrhage. 2. Hyperdense proximal right M2 and/or M3 vessel at the base of the right sylvian fissure, concerning for LVO. 3. Aspects = 7. CTA HEAD AND NECK IMPRESSION: 1. Positive CTA for large vessel occlusion. 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 Markedly attenuated and diminutive flow distally to the right ICA terminus. Possible superimposed dissection may be present. 2. Distal reconstitution of the right M1 segment via collateral flow across the circle-of-Willis but with distal embolic occlusion of a proximal right M2/M3 branch, superior division as above. 3. Atheromatous change about the left carotid bifurcation and left carotid siphon without high-grade stenosis. 4. 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 5. 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 CT PERFUSION IMPRESSION: Positive CT perfusion with 19 cc core infarct involving the right insula and overlying supra ganglionic right mid and posterior frontal region. 33 cc surrounding ischemic penumbra. Critical Value/emergent results were called by telephone at the time of interpretation on 07/09/2019 at 8:49 p.m. and again at 9:09 pm to provider [facility] , who verbally acknowledged these results. Electronically Signed   By: Jeannine Boga M.D.   On:  07/09/2019 21:41    Labs:  CBC: Recent Labs    04/14/19 1326 07/09/19 2031 07/09/19 2054 07/10/19 0208 07/10/19 0259  WBC 5.8 8.1  --   --  11.2*  HGB 14.3 15.0 15.0 11.6* 12.3*  HCT 41.7 45.8 44.0 34.0* 38.0*  PLT 252 264  --   --  163    COAGS: Recent Labs    07/09/19 2031  INR 1.4*  APTT 38*    BMP: Recent Labs    04/14/19 1326 04/14/19 1326 07/09/19 2031 07/09/19 2054 07/10/19 0208 07/10/19 0259  NA 144  --  143 145 143 141  K 4.6   < > 3.8 3.7 3.3* 3.4*  CL 109*  --  109 110  --  112*  CO2 22  --  20*  --   --  15*  GLUCOSE 102*  --  129* 123*  --  176*  BUN 28*  --  29* 30*  --  24*  CALCIUM 10.0  --  9.9  --   --  8.3*  CREATININE 1.76*  --  1.71* 1.60*  --  1.56*  GFRNONAA 36*  --  38*  --   --  42*  GFRAA 42*  --  43*  --   --  49*   < > = values in this interval not displayed.    LIVER FUNCTION TESTS: Recent Labs    04/14/19 1326 07/09/19 2031  BILITOT 0.8 1.3*  AST 17 19  ALT 13 14  ALKPHOS 108 102  PROT 6.9 7.3  ALBUMIN 4.1 3.9    Assessment and Plan:  History of CVA s/p cerebral arteriogram with emergent mechanical thrombectomy of right MCA middle trifurcation occlusion achieving a TICI 3 revascularization, along with stent assisted angioplasty of proximal right ICA acute occlusion 07/09/2019 by Dr. Estanislado Pandy. Patient's condition improving- alert and following simple commands, can spontaneously move all extremities with weakness of left. Right groin incision stable, distal pulses 1+ bilaterally. VAS US carotids this AM revealed patent right ICA stent. Continue taking Brilinta 90 mg once daily and Aspirin 81 mg once daily, will obtain P2Y12 today to check effectiveness of DAPT, place Cortrack to facilitate DAPT administration. Plan to follow-up with Dr. Estanislado Pandy in clinic 4 weeks after discharge- order placed to facilitate this. Further plans per neurology- appreciate and agree with management. NIR to follow.   Electronically  Signed: Earley Abide, PA-C 07/10/2019, 9:59 AM   I spent a total of 35 Minutes at the the patient's bedside AND on the patient's hospital floor or unit, greater than 50% of which was counseling/coordinating care for CVA s/p revascularization.

## 2019-07-10 NOTE — Progress Notes (Signed)
[facility] Progress Note Patient Name: Stephen Copeland DOB: 05-27-1940 MRN: [id number]   Date of Service  07/10/2019  HPI/Events of Note  Multiple issues: 1. Hypokalemia - K+ = 3.3 and Creatinine = 1.6 and 2. 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 In the setting of LBBB, the EKG is not diagnostic for ischemia.   eICU Interventions  Will order: 1. Replace K+.  2. Cycle Troponin     Intervention Category Major Interventions: Electrolyte abnormality - evaluation and management;Other:  Lysle Dingwall 07/10/2019, 2:32 AM

## 2019-07-11 LAB — GLUCOSE, CAPILLARY
Glucose-Capillary: 128 mg/dL — ABNORMAL HIGH (ref 70–99)
Glucose-Capillary: 129 mg/dL — ABNORMAL HIGH (ref 70–99)
Glucose-Capillary: 129 mg/dL — ABNORMAL HIGH (ref 70–99)
Glucose-Capillary: 123 mg/dL — ABNORMAL HIGH (ref 70–99)
Glucose-Capillary: 124 mg/dL — ABNORMAL HIGH (ref 70–99)

## 2019-07-11 LAB — BASIC METABOLIC PANEL
Anion gap: 10 (ref 5–15)
BUN: 20 mg/dL (ref 8–23)
CO2: 19 mmol/L — ABNORMAL LOW (ref 22–32)
Calcium: 8.4 mg/dL — ABNORMAL LOW (ref 8.9–10.3)
Chloride: 113 mmol/L — ABNORMAL HIGH (ref 98–111)
Creatinine, Ser: 1.45 mg/dL — ABNORMAL HIGH (ref 0.61–1.24)
GFR calc Af Amer: 53 mL/min — ABNORMAL LOW (ref >60)
GFR calc non Af Amer: 46 mL/min — ABNORMAL LOW (ref >60)
Glucose, Bld: 136 mg/dL — ABNORMAL HIGH (ref 70–99)
Potassium: 3.5 mmol/L (ref 3.5–5.1)
Sodium: 142 mmol/L (ref 135–145)

## 2019-07-11 LAB — MAGNESIUM
Magnesium: 1.8 mg/dL (ref 1.7–2.4)
Magnesium: 1.8 mg/dL (ref 1.7–2.4)

## 2019-07-11 LAB — CBC
HCT: 33.6 % — ABNORMAL LOW (ref 39.0–52.0)
Hemoglobin: 10.9 g/dL — ABNORMAL LOW (ref 13.0–17.0)
MCH: 31.6 pg (ref 26.0–34.0)
MCHC: 32.4 g/dL (ref 30.0–36.0)
MCV: 97.4 fL (ref 80.0–100.0)
Platelets: 183 10*3/uL (ref 150–400)
RBC: 3.45 MIL/uL — ABNORMAL LOW (ref 4.22–5.81)
RDW: 14 % (ref 11.5–15.5)
WBC: 9.1 10*3/uL (ref 4.0–10.5)
nRBC: 0 % (ref 0.0–0.2)

## 2019-07-11 LAB — PLATELET INHIBITION P2Y12: Platelet Function  P2Y12: 91 [PRU] — ABNORMAL LOW (ref 182–335)

## 2019-07-11 LAB — PHOSPHORUS
Phosphorus: 2.1 mg/dL — ABNORMAL LOW (ref 2.5–4.6)
Phosphorus: 2.4 mg/dL — ABNORMAL LOW (ref 2.5–4.6)

## 2019-07-11 MED ORDER — CHLORHEXIDINE GLUCONATE 0.12 % MT SOLN
15.0000 mL | Freq: Two times a day (BID) | OROMUCOSAL | Status: DC
  Administered 2019-07-11 – 2019-07-13 (×5): 15 mL via OROMUCOSAL
  Filled 2019-07-11 (×3): qty 15

## 2019-07-11 MED ORDER — HYDRALAZINE HCL 25 MG PO TABS
25.0000 mg | ORAL_TABLET | Freq: Three times a day (TID) | ORAL | Status: DC
  Administered 2019-07-11 – 2019-07-13 (×6): 25 mg
  Filled 2019-07-11 (×6): qty 1

## 2019-07-11 MED ORDER — STROKE: EARLY STAGES OF RECOVERY BOOK
Status: AC
  Administered 2019-07-11: 1
  Filled 2019-07-11: qty 1

## 2019-07-11 MED ORDER — ORAL CARE MOUTH RINSE
15.0000 mL | Freq: Two times a day (BID) | OROMUCOSAL | Status: DC
  Administered 2019-07-11 – 2019-07-12 (×4): 15 mL via OROMUCOSAL

## 2019-07-11 MED ORDER — CLEVIDIPINE BUTYRATE 0.5 MG/ML IV EMUL
0.0000 mg/h | INTRAVENOUS | Status: AC
  Administered 2019-07-11: 12 mg/h via INTRAVENOUS
  Administered 2019-07-11: 8 mg/h via INTRAVENOUS
  Filled 2019-07-11 (×2): qty 50

## 2019-07-11 NOTE — Progress Notes (Signed)
Report called to [employer] ([facility] Therapist, sports). Also called wife and updated with patient status and new room information. All belongings transported with patient.

## 2019-07-11 NOTE — Progress Notes (Signed)
Inpatient Rehab Admissions Coordinator Note:   Per PT and OT recommendations, pt was screened for CIR candidacy by Gayland Curry, MS, CCC-SLP.  At this time, we are recommending Inpatient Rehab consult.  I will contact doctor to request consult order.  Please contact me with questions.   Gayland Curry, [place], [place] Admissions Coordinator [phone]

## 2019-07-11 NOTE — Progress Notes (Signed)
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 Estanislado Pandy.  Resting; no complaints PT worked with pt yesterday per RN--- up to standing with them (assistance)   Allergies: Patient has no known allergies.  Medications: Prior to Admission medications   Medication Sig Start Date End Date Taking? Authorizing Provider  apixaban (ELIQUIS) 5 MG TABS tablet Take 1 tablet (5 mg total) by mouth 2 (two) times daily. 02/17/19  Yes Burnell Blanks, MD  Calcium Carb-Cholecalciferol (CALCIUM 600-D PO) Take 1 tablet by mouth daily.   Yes [provider]  Cholecalciferol (VITAMIN D) 50 MCG (2000 UT) tablet Take 2,000 Units by mouth daily.   Yes [provider]  Coenzyme Q10 (COQ10) 100 MG CAPS Take 100 mg by mouth daily.   Yes [provider]  hydrALAZINE (APRESOLINE) 50 MG tablet TAKE 1 TABLET THREE TIMES A DAY (NEED FEBRUARY APPOINTMENT FOR FURTHER REFILLS THANKS ) Patient taking differently: Take 50 mg by mouth 3 (three) times daily.  07/06/19  Yes Burnell Blanks, MD  hydrocortisone cream 1 % Apply 1 application topically 2 (two) times daily as needed for itching.    Yes [provider]  lidocaine (XYLOCAINE) 5 % ointment Apply 1 application topically 2 (two) times daily as needed (pain).    Yes [provider]  metoprolol tartrate (LOPRESSOR) 25 MG tablet TAKE 1 TABLET TWICE A DAY Patient taking differently: Take 25 mg by mouth 2 (two) times daily.  07/08/19  Yes Burnell Blanks, MD  nitroGLYCERIN (NITROSTAT) 0.4 MG SL tablet Place 0.4 mg under the tongue every 5 (five)  minutes as needed for chest pain.  02/20/16  Yes [provider]  omeprazole (PRILOSEC) 20 MG capsule Take 1 capsule (20 mg total) by mouth daily. Patient taking differently: Take 20 mg by mouth daily as needed (acid reflux/indigestion).  10/24/17  Yes Burnell Blanks, MD  tamsulosin (FLOMAX) 0.4 MG CAPS capsule Take 0.4 mg by mouth daily after supper.  03/14/17  Yes [provider]     Vital Signs: BP 116/63   Pulse 85   Temp 98.4 F (36.9 C) (Oral)   Resp 16   Ht 6' (1.829 m) Comment: measured x 3 upon arrival to unit  Wt 185 lb 13.6 oz (84.3 kg)   SpO2 95%   BMI 25.21 kg/m   Physical Exam Vitals reviewed.  HENT:     Mouth/Throat:     Comments: Left facial/mouth droop tongue midline Able to move left/right  Musculoskeletal:     Comments: Moves all 4s--- very little movement of LUE Rt groin NT no bleeding and no hematoma  Skin:    General: Skin is warm and dry.  Neurological:     Mental Status: He is alert.     Comments: Able to convers with me Talks about about wife and sister coming today     Imaging: CT Code Stroke CTA Head W/WO contrast  Result Date: 07/09/2019 EXAM: CT ANGIOGRAPHY HEAD AND NECK CT PERFUSION BRAIN TECHNIQUE: Multidetector CT imaging of the head and neck was performed using the standard protocol during bolus administration of intravenous contrast. Multiplanar CT image reconstructions and MIPs were obtained to evaluate  the vascular anatomy. Carotid stenosis measurements (when applicable) are obtained utilizing NASCET criteria, using the distal internal carotid diameter as the denominator. Multiphase CT imaging of the brain was performed following IV bolus contrast injection. Subsequent parametric perfusion maps were calculated using RAPID software. CONTRAST:  100 cc of Omni 350. COMPARISON:  None available. FINDINGS: CT HEAD FINDINGS Brain: Age-related cerebral atrophy with mild chronic small vessel ischemic disease. Patchy evolving  hypodensity seen involving the right insula, with extension to involve the overlying supra ganglionic right cerebral cortex, consistent with evolving acute right MCA territory infarct. Relative sparing of the deep gray nuclei and internal capsule. No acute intracranial hemorrhage. No other acute large vessel territory infarct. No mass lesion or midline shift. No hydrocephalus or extra-axial fluid collection. Vascular: Asymmetric hyperdensity seen involving a proximal right M2 branch at the base of the right sylvian fissure, concerning for LV of (series 2, image 18). Skull: Scalp soft tissues and calvarium within normal limits. Sinuses/Orbits: Globes and orbital soft tissues within normal limits. Paranasal sinuses and mastoid air cells are clear. Other: None. ASPECTS ([place] Stroke Program Early CT Score) - Ganglionic level infarction (caudate, lentiform nuclei, internal capsule, insula, M1-M3 cortex): 6 - Supraganglionic infarction (M4-M6 cortex): 1 Total score (0-10 with 10 being normal): 7 Review of the MIP images confirms the above findings CTA NECK FINDINGS Aortic arch: Visualized aortic arch normal in caliber with normal 3 vessel morphology. Mild-to-moderate scattered plaque about the arch and origin of the great vessels without high-grade stenosis. Atheromatous plaque at the origin of the right subclavian artery with short-segment 60% stenosis. Visualized subclavian arteries otherwise patent. Right carotid system: Right CCA mildly tortuous but widely patent to the bifurcation without stenosis. 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 A radiographic string sign is present. Plaque is predominantly hypodense in nature, suspected to reflect thrombus, and favored to be acute in nature, likely reflecting a ruptured plaque. Distally, right ICA is diminutive in caliber with markedly attenuated flow but remains grossly  patent to the bifurcation. An underlying superimposed dissection could be present. Left carotid system: Left CCA patent from its origin to the bifurcation without flow-limiting stenosis. Bulky calcified plaque about the left bifurcation with relatively mild 30% stenosis by NASCET criteria. Left ICA mildly tortuous but otherwise widely patent distally to the skull base without stenosis, dissection or occlusion. Vertebral arteries: Both vertebral arteries arise from the subclavian arteries. Atheromatous plaque at the origin of the vertebral arteries with associated moderate approximate 50% stenosis bilaterally. Vertebral arteries tortuous but otherwise widely patent to the skull base without stenosis, dissection or occlusion. Skeleton: No acute osseous abnormality. No discrete or worrisome osseous lesions. Moderate multilevel cervical spondylosis, most pronounced at C5-6 and C6-7. Other neck: No other acute soft tissue abnormality within the neck. No mass lesion or adenopathy. Upper chest: Visualized upper chest demonstrates no acute finding. Punctate calcified granuloma noted within the right upper lobe. Review of the MIP images confirms the above findings CTA HEAD FINDINGS Anterior circulation: Petrous left ICA widely patent. Mild atheromatous change within the cavernous/supraclinoid left ICA without flow-limiting stenosis. Faint opacification of the right ICA to the level of the terminus. Both A1 segments well perfused via collateral flow across the circle-of-Willis. Widely patent anterior communicating artery. Both ACAs well perfused and patent to their distal aspects. Right M1 segment perfused and widely patent. Normal right MCA bifurcation. There is abrupt occlusion of a proximal right  M2 and/or M3 branch just distal to the right MCA bifurcation (series 7, image 64). This arises from the superior division. Remainder of the right MCA branches are perfused distally. Left M1 widely patent. Normal left MCA  bifurcation. Distal left MCA branches well perfused without occlusion. Posterior circulation: Both vertebral arteries widely patent to the vertebrobasilar junction without stenosis. Both picas patent. Basilar widely patent to its distal aspect without stenosis. Superior cerebral arteries patent bilaterally. Both PCAs primarily supplied via the basilar. Single short-segment moderate distal left P2 stenosis noted (series 8, image 14). PCAs otherwise widely patent to their distal aspects. Venous sinuses: Visualized major dural sinuses are grossly patent allowing for timing the contrast bolus. Anatomic variants: None significant. Review of the MIP images confirms the above findings CT Brain Perfusion Findings: ASPECTS: 7 CBF (<30%) Volume: 65mL Perfusion (Tmax>6.0s) volume: 76mL Mismatch Volume: 38mL Infarction Location:Acute core infarct seen involving the right insula and overlying supra ganglionic mid and posterior right frontal lobe. Mild surrounding ischemic penumbra. IMPRESSION: CT HEAD IMPRESSION: 1. 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 No associated hemorrhage. 2. Hyperdense proximal right M2 and/or M3 vessel at the base of the right sylvian fissure, concerning for LVO. 3. Aspects = 7. CTA HEAD AND NECK IMPRESSION: 1. Positive CTA for large vessel occlusion. 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 Markedly attenuated and diminutive flow distally to the right ICA terminus. Possible superimposed dissection may be present. 2. Distal reconstitution of the right M1 segment via collateral flow across the circle-of-Willis but with distal embolic occlusion of a proximal right M2/M3 branch, superior division as above. 3. Atheromatous change about the left carotid bifurcation and left carotid siphon without high-grade stenosis. 4. Approximate 50% atheromatous  stenoses at the origins of both vertebral arteries. 5. 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 CT PERFUSION IMPRESSION: Positive CT perfusion with 19 cc core infarct involving the right insula and overlying supra ganglionic right mid and posterior frontal region. 33 cc surrounding ischemic penumbra. Critical Value/emergent results were called by telephone at the time of interpretation on 07/09/2019 at 8:49 p.m. and again at 9:09 pm to provider [facility] , who verbally acknowledged these results. Electronically Signed   By: Jeannine Boga M.D.   On: 07/09/2019 21:41   DG Abd 1 View  Result Date: 07/10/2019 CLINICAL DATA:  Patient is for MRI. EXAM: ABDOMEN - 1 VIEW COMPARISON:  None. FINDINGS: The bowel gas pattern is normal. Distal tip of nasogastric tube is seen in proximal stomach. Residual contrast is seen in the intrarenal collecting systems bilaterally. No radiopaque metallic density is noted. No radio-opaque calculi or other significant radiographic abnormality are seen. IMPRESSION: Distal tip of nasogastric tube seen in proximal stomach. No radiopaque metallic density is noted. Electronically Signed   By: Marijo Conception M.D.   On: 07/10/2019 08:35   CT HEAD WO CONTRAST  Result Date: 07/10/2019 CLINICAL DATA:  Stroke, follow-up. EXAM: CT HEAD WITHOUT CONTRAST TECHNIQUE: Contiguous axial images were obtained from the base of the skull through the vertex without intravenous contrast. COMPARISON:  CT head without contrast 07/09/2019. CT head without contrast performed in the angiography suite 07/10/2019. FINDINGS: Brain: Subarachnoid hemorrhage within the right sylvian fissure is again noted. Additional contrast or hemorrhage along the right side of the suprasellar cistern following the course of the right internal carotid artery has increased. Right-sided sulci are  effaced. No midline shift is present. No significant mass effect is evident on the right lateral ventricle. White  matter disease is stable. No significant extra-axial collections are present on the left. Brainstem and cerebellum are otherwise unremarkable. Vascular: Atherosclerotic calcifications are present within the cavernous internal carotid arteries bilaterally. Skull: Calvarium is intact. No focal lytic or blastic lesions are present. No significant extracranial soft tissue lesion is present. Sinuses/Orbits: The paranasal sinuses and mastoid air cells are clear. Bilateral lens replacements are noted. Globes and orbits are otherwise unremarkable. IMPRESSION: 1. Subarachnoid hemorrhage within the right Sylvian fissure is again noted. 2. Additional contrast or hemorrhage along the right side of the suprasellar cistern following the course of the right internal carotid artery has increased. This is concerning for additional subarachnoid hemorrhage. 3. Some effacement of sulci over the right hemisphere without significant mass effect on the right lateral ventricle. 4. Stable diffuse white matter disease. Electronically Signed   By: San Morelle M.D.   On: 07/10/2019 05:04   CT Code Stroke CTA Neck W/WO contrast  Result Date: 07/09/2019 EXAM: CT ANGIOGRAPHY HEAD AND NECK CT PERFUSION BRAIN TECHNIQUE: Multidetector CT imaging of the head and neck was performed using the standard protocol during bolus administration of intravenous contrast. Multiplanar CT image reconstructions and MIPs were obtained to evaluate the vascular anatomy. Carotid stenosis measurements (when applicable) are obtained utilizing NASCET criteria, using the distal internal carotid diameter as the denominator. Multiphase CT imaging of the brain was performed following IV bolus contrast injection. Subsequent parametric perfusion maps were calculated using RAPID software. CONTRAST:  100 cc of Omni 350. COMPARISON:  None available. FINDINGS: CT HEAD FINDINGS Brain: Age-related cerebral atrophy with mild chronic small vessel ischemic disease. Patchy  evolving hypodensity seen involving the right insula, with extension to involve the overlying supra ganglionic right cerebral cortex, consistent with evolving acute right MCA territory infarct. Relative sparing of the deep gray nuclei and internal capsule. No acute intracranial hemorrhage. No other acute large vessel territory infarct. No mass lesion or midline shift. No hydrocephalus or extra-axial fluid collection. Vascular: Asymmetric hyperdensity seen involving a proximal right M2 branch at the base of the right sylvian fissure, concerning for LV of (series 2, image 18). Skull: Scalp soft tissues and calvarium within normal limits. Sinuses/Orbits: Globes and orbital soft tissues within normal limits. Paranasal sinuses and mastoid air cells are clear. Other: None. ASPECTS ([place] Stroke Program Early CT Score) - Ganglionic level infarction (caudate, lentiform nuclei, internal capsule, insula, M1-M3 cortex): 6 - Supraganglionic infarction (M4-M6 cortex): 1 Total score (0-10 with 10 being normal): 7 Review of the MIP images confirms the above findings CTA NECK FINDINGS Aortic arch: Visualized aortic arch normal in caliber with normal 3 vessel morphology. Mild-to-moderate scattered plaque about the arch and origin of the great vessels without high-grade stenosis. Atheromatous plaque at the origin of the right subclavian artery with short-segment 60% stenosis. Visualized subclavian arteries otherwise patent. Right carotid system: Right CCA mildly tortuous but widely patent to the bifurcation without stenosis. 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 A radiographic string sign is present. Plaque is predominantly hypodense in nature, suspected to reflect thrombus, and favored to be acute in nature, likely reflecting a ruptured plaque. Distally, right ICA is diminutive in caliber with markedly attenuated flow but remains  grossly patent to the bifurcation. An underlying superimposed dissection could be present. Left carotid system: Left CCA patent from  its origin to the bifurcation without flow-limiting stenosis. Bulky calcified plaque about the left bifurcation with relatively mild 30% stenosis by NASCET criteria. Left ICA mildly tortuous but otherwise widely patent distally to the skull base without stenosis, dissection or occlusion. Vertebral arteries: Both vertebral arteries arise from the subclavian arteries. Atheromatous plaque at the origin of the vertebral arteries with associated moderate approximate 50% stenosis bilaterally. Vertebral arteries tortuous but otherwise widely patent to the skull base without stenosis, dissection or occlusion. Skeleton: No acute osseous abnormality. No discrete or worrisome osseous lesions. Moderate multilevel cervical spondylosis, most pronounced at C5-6 and C6-7. Other neck: No other acute soft tissue abnormality within the neck. No mass lesion or adenopathy. Upper chest: Visualized upper chest demonstrates no acute finding. Punctate calcified granuloma noted within the right upper lobe. Review of the MIP images confirms the above findings CTA HEAD FINDINGS Anterior circulation: Petrous left ICA widely patent. Mild atheromatous change within the cavernous/supraclinoid left ICA without flow-limiting stenosis. Faint opacification of the right ICA to the level of the terminus. Both A1 segments well perfused via collateral flow across the circle-of-Willis. Widely patent anterior communicating artery. Both ACAs well perfused and patent to their distal aspects. Right M1 segment perfused and widely patent. Normal right MCA bifurcation. There is abrupt occlusion of a proximal right M2 and/or M3 branch just distal to the right MCA bifurcation (series 7, image 64). This arises from the superior division. Remainder of the right MCA branches are perfused distally. Left M1 widely patent. Normal left MCA  bifurcation. Distal left MCA branches well perfused without occlusion. Posterior circulation: Both vertebral arteries widely patent to the vertebrobasilar junction without stenosis. Both picas patent. Basilar widely patent to its distal aspect without stenosis. Superior cerebral arteries patent bilaterally. Both PCAs primarily supplied via the basilar. Single short-segment moderate distal left P2 stenosis noted (series 8, image 14). PCAs otherwise widely patent to their distal aspects. Venous sinuses: Visualized major dural sinuses are grossly patent allowing for timing the contrast bolus. Anatomic variants: None significant. Review of the MIP images confirms the above findings CT Brain Perfusion Findings: ASPECTS: 7 CBF (<30%) Volume: 78mL Perfusion (Tmax>6.0s) volume: 22mL Mismatch Volume: 36mL Infarction Location:Acute core infarct seen involving the right insula and overlying supra ganglionic mid and posterior right frontal lobe. Mild surrounding ischemic penumbra. IMPRESSION: CT HEAD IMPRESSION: 1. 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 No associated hemorrhage. 2. Hyperdense proximal right M2 and/or M3 vessel at the base of the right sylvian fissure, concerning for LVO. 3. Aspects = 7. CTA HEAD AND NECK IMPRESSION: 1. Positive CTA for large vessel occlusion. 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 Markedly attenuated and diminutive flow distally to the right ICA terminus. Possible superimposed dissection may be present. 2. Distal reconstitution of the right M1 segment via collateral flow across the circle-of-Willis but with distal embolic occlusion of a proximal right M2/M3 branch, superior division as above. 3. Atheromatous change about the left carotid bifurcation and left carotid siphon without high-grade stenosis. 4. Approximate 50% atheromatous  stenoses at the origins of both vertebral arteries. 5. 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 CT PERFUSION IMPRESSION: Positive CT perfusion with 19 cc core infarct involving the right insula and overlying supra ganglionic right mid and posterior frontal region. 33 cc surrounding ischemic penumbra. Critical Value/emergent results were called by telephone at the time of interpretation on  07/09/2019 at 8:49 p.m. and again at 9:09 pm to provider [facility] , who verbally acknowledged these results. Electronically Signed   By: Jeannine Boga M.D.   On: 07/09/2019 21:41   MR ANGIO HEAD WO CONTRAST  Result Date: 07/10/2019 CLINICAL DATA:  Stroke follow-up. Acute right MCA infarct. Status post endovascular revascularization of proximal right ICA and right M2 occlusions. EXAM: MRI HEAD WITHOUT CONTRAST MRA HEAD WITHOUT CONTRAST TECHNIQUE: Multiplanar, multiecho pulse sequences of the brain and surrounding structures were obtained without intravenous contrast. Angiographic images of the head were obtained using MRA technique without contrast. COMPARISON:  Head CT 07/10/2019 FINDINGS: MRI HEAD FINDINGS Some sequences are moderately motion degraded. Brain: There are patchy acute right MCA territory infarcts with greatest involvement of the insula and corona radiata. Scattered small cortical infarcts are present in the right frontal, parietal, and superolateral occipital lobes. Subarachnoid hemorrhage is again seen in the right sylvian fissure, right lateral frontoparietal sulci, and suprasellar cistern, and there are also small volume blood products in the occipital horns of both lateral ventricles. T2 hyperintensity scattered in the cerebral white matter bilaterally are nonspecific but compatible with mild chronic small vessel ischemic disease. Mild cerebral atrophy is within normal limits for age. Vascular: Major intracranial vascular flow voids are preserved. Skull and upper cervical spine:  Unremarkable bone marrow signal para Sinuses/Orbits: Bilateral cataract extraction. Paranasal sinuses and mastoid air cells are clear. Other: None. MRA HEAD FINDINGS The study is severely motion degraded, particularly through the ACA and MCA branch vessels. The visualized distal vertebral arteries are patent to the basilar. Patent PICA and SCA origins are seen bilaterally. The basilar artery is widely patent. Posterior communicating arteries are diminutive or absent. Both PCAs are patent without evidence of a flow limiting proximal stenosis. The internal carotid arteries are patent from the included distal cervical segments through the carotid termini without evidence of significant stenosis. The A1 and M1 segments are patent bilaterally without evidence of significant proximal stenosis, however the distal A1 and M1 segments as well as the ACA and MCA branch vessels are poorly evaluated due to motion. No large aneurysm is identified. IMPRESSION: 1. Patchy acute right MCA infarcts. 2. Subarachnoid hemorrhage greatest in the right sylvian fissure as seen on earlier CT. 3. Small volume intraventricular hemorrhage. 4. Severely motion degraded head MRA. Persistent patency of the revascularized right ICA. No flow limiting proximal stenosis identified. Electronically Signed   By: Logan Bores M.D.   On: 07/10/2019 14:18   MR BRAIN WO CONTRAST  Result Date: 07/10/2019 CLINICAL DATA:  Stroke follow-up. Acute right MCA infarct. Status post endovascular revascularization of proximal right ICA and right M2 occlusions. EXAM: MRI HEAD WITHOUT CONTRAST MRA HEAD WITHOUT CONTRAST TECHNIQUE: Multiplanar, multiecho pulse sequences of the brain and surrounding structures were obtained without intravenous contrast. Angiographic images of the head were obtained using MRA technique without contrast. COMPARISON:  Head CT 07/10/2019 FINDINGS: MRI HEAD FINDINGS Some sequences are moderately motion degraded. Brain: There are patchy acute  right MCA territory infarcts with greatest involvement of the insula and corona radiata. Scattered small cortical infarcts are present in the right frontal, parietal, and superolateral occipital lobes. Subarachnoid hemorrhage is again seen in the right sylvian fissure, right lateral frontoparietal sulci, and suprasellar cistern, and there are also small volume blood products in the occipital horns of both lateral ventricles. T2 hyperintensity scattered in the cerebral white matter bilaterally are nonspecific but compatible with mild chronic small vessel ischemic disease. Mild cerebral atrophy is  within normal limits for age. Vascular: Major intracranial vascular flow voids are preserved. Skull and upper cervical spine: Unremarkable bone marrow signal para Sinuses/Orbits: Bilateral cataract extraction. Paranasal sinuses and mastoid air cells are clear. Other: None. MRA HEAD FINDINGS The study is severely motion degraded, particularly through the ACA and MCA branch vessels. The visualized distal vertebral arteries are patent to the basilar. Patent PICA and SCA origins are seen bilaterally. The basilar artery is widely patent. Posterior communicating arteries are diminutive or absent. Both PCAs are patent without evidence of a flow limiting proximal stenosis. The internal carotid arteries are patent from the included distal cervical segments through the carotid termini without evidence of significant stenosis. The A1 and M1 segments are patent bilaterally without evidence of significant proximal stenosis, however the distal A1 and M1 segments as well as the ACA and MCA branch vessels are poorly evaluated due to motion. No large aneurysm is identified. IMPRESSION: 1. Patchy acute right MCA infarcts. 2. Subarachnoid hemorrhage greatest in the right sylvian fissure as seen on earlier CT. 3. Small volume intraventricular hemorrhage. 4. Severely motion degraded head MRA. Persistent patency of the revascularized right ICA.  No flow limiting proximal stenosis identified. Electronically Signed   By: Logan Bores M.D.   On: 07/10/2019 14:18   CT Code Stroke Cerebral Perfusion with contrast  Result Date: 07/09/2019 EXAM: CT ANGIOGRAPHY HEAD AND NECK CT PERFUSION BRAIN TECHNIQUE: Multidetector CT imaging of the head and neck was performed using the standard protocol during bolus administration of intravenous contrast. Multiplanar CT image reconstructions and MIPs were obtained to evaluate the vascular anatomy. Carotid stenosis measurements (when applicable) are obtained utilizing NASCET criteria, using the distal internal carotid diameter as the denominator. Multiphase CT imaging of the brain was performed following IV bolus contrast injection. Subsequent parametric perfusion maps were calculated using RAPID software. CONTRAST:  100 cc of Omni 350. COMPARISON:  None available. FINDINGS: CT HEAD FINDINGS Brain: Age-related cerebral atrophy with mild chronic small vessel ischemic disease. Patchy evolving hypodensity seen involving the right insula, with extension to involve the overlying supra ganglionic right cerebral cortex, consistent with evolving acute right MCA territory infarct. Relative sparing of the deep gray nuclei and internal capsule. No acute intracranial hemorrhage. No other acute large vessel territory infarct. No mass lesion or midline shift. No hydrocephalus or extra-axial fluid collection. Vascular: Asymmetric hyperdensity seen involving a proximal right M2 branch at the base of the right sylvian fissure, concerning for LV of (series 2, image 18). Skull: Scalp soft tissues and calvarium within normal limits. Sinuses/Orbits: Globes and orbital soft tissues within normal limits. Paranasal sinuses and mastoid air cells are clear. Other: None. ASPECTS ([place] Stroke Program Early CT Score) - Ganglionic level infarction (caudate, lentiform nuclei, internal capsule, insula, M1-M3 cortex): 6 - Supraganglionic infarction (M4-M6  cortex): 1 Total score (0-10 with 10 being normal): 7 Review of the MIP images confirms the above findings CTA NECK FINDINGS Aortic arch: Visualized aortic arch normal in caliber with normal 3 vessel morphology. Mild-to-moderate scattered plaque about the arch and origin of the great vessels without high-grade stenosis. Atheromatous plaque at the origin of the right subclavian artery with short-segment 60% stenosis. Visualized subclavian arteries otherwise patent. Right carotid system: Right CCA mildly tortuous but widely patent to the bifurcation without stenosis. 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 A radiographic string sign is present. Plaque is predominantly hypodense in nature, suspected to  reflect thrombus, and favored to be acute in nature, likely reflecting a ruptured plaque. Distally, right ICA is diminutive in caliber with markedly attenuated flow but remains grossly patent to the bifurcation. An underlying superimposed dissection could be present. Left carotid system: Left CCA patent from its origin to the bifurcation without flow-limiting stenosis. Bulky calcified plaque about the left bifurcation with relatively mild 30% stenosis by NASCET criteria. Left ICA mildly tortuous but otherwise widely patent distally to the skull base without stenosis, dissection or occlusion. Vertebral arteries: Both vertebral arteries arise from the subclavian arteries. Atheromatous plaque at the origin of the vertebral arteries with associated moderate approximate 50% stenosis bilaterally. Vertebral arteries tortuous but otherwise widely patent to the skull base without stenosis, dissection or occlusion. Skeleton: No acute osseous abnormality. No discrete or worrisome osseous lesions. Moderate multilevel cervical spondylosis, most pronounced at C5-6 and C6-7. Other neck: No other acute soft tissue abnormality within the  neck. No mass lesion or adenopathy. Upper chest: Visualized upper chest demonstrates no acute finding. Punctate calcified granuloma noted within the right upper lobe. Review of the MIP images confirms the above findings CTA HEAD FINDINGS Anterior circulation: Petrous left ICA widely patent. Mild atheromatous change within the cavernous/supraclinoid left ICA without flow-limiting stenosis. Faint opacification of the right ICA to the level of the terminus. Both A1 segments well perfused via collateral flow across the circle-of-Willis. Widely patent anterior communicating artery. Both ACAs well perfused and patent to their distal aspects. Right M1 segment perfused and widely patent. Normal right MCA bifurcation. There is abrupt occlusion of a proximal right M2 and/or M3 branch just distal to the right MCA bifurcation (series 7, image 64). This arises from the superior division. Remainder of the right MCA branches are perfused distally. Left M1 widely patent. Normal left MCA bifurcation. Distal left MCA branches well perfused without occlusion. Posterior circulation: Both vertebral arteries widely patent to the vertebrobasilar junction without stenosis. Both picas patent. Basilar widely patent to its distal aspect without stenosis. Superior cerebral arteries patent bilaterally. Both PCAs primarily supplied via the basilar. Single short-segment moderate distal left P2 stenosis noted (series 8, image 14). PCAs otherwise widely patent to their distal aspects. Venous sinuses: Visualized major dural sinuses are grossly patent allowing for timing the contrast bolus. Anatomic variants: None significant. Review of the MIP images confirms the above findings CT Brain Perfusion Findings: ASPECTS: 7 CBF (<30%) Volume: 1mL Perfusion (Tmax>6.0s) volume: 44mL Mismatch Volume: 75mL Infarction Location:Acute core infarct seen involving the right insula and overlying supra ganglionic mid and posterior right frontal lobe. Mild  surrounding ischemic penumbra. IMPRESSION: CT HEAD IMPRESSION: 1. 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 No associated hemorrhage. 2. Hyperdense proximal right M2 and/or M3 vessel at the base of the right sylvian fissure, concerning for LVO. 3. Aspects = 7. CTA HEAD AND NECK IMPRESSION: 1. Positive CTA for large vessel occlusion. 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 Markedly attenuated and diminutive flow distally to the right ICA terminus. Possible superimposed dissection may be present. 2. Distal reconstitution of the right M1 segment via collateral flow across the circle-of-Willis but with distal embolic occlusion of a proximal right M2/M3 branch, superior division as above. 3. Atheromatous change about the left carotid bifurcation and left carotid siphon without high-grade stenosis. 4. 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 5. 60% stenosis at the origin of the  right subclavian artery. CT PERFUSION IMPRESSION: Positive CT perfusion with 19 cc core infarct involving the right insula and overlying supra ganglionic right mid and posterior frontal region. 33 cc surrounding ischemic penumbra. Critical Value/emergent results were called by telephone at the time of interpretation on 07/09/2019 at 8:49 p.m. and again at 9:09 pm to provider [facility] , who verbally acknowledged these results. Electronically Signed   By: Jeannine Boga M.D.   On: 07/09/2019 21:41   DG Chest Port 1 View  Result Date: 07/10/2019 CLINICAL DATA:  79 year old male with stroke. EXAM: PORTABLE CHEST 1 VIEW COMPARISON:  Chest radiograph dated 12/18/2016. FINDINGS: Endotracheal tube approximately 6 cm above the carina. Enteric tube extends below the diaphragm with tip beyond the inferior margin of the image. The side port of the tube  appears at the level of the GE junction. There is eventration of the right hemidiaphragm. Bibasilar atelectasis or infiltrate. No pneumothorax. There is mild cardiomegaly. Atherosclerotic calcification of the aorta. No acute osseous pathology. IMPRESSION: 1. Endotracheal tube above the carina. 2. Enteric tube with tip beyond the inferior margin of the image. 3. Bibasilar atelectasis or infiltrate. Electronically Signed   By: Anner Crete M.D.   On: 07/10/2019 02:37   ECHOCARDIOGRAM COMPLETE  Result Date: 07/10/2019    ECHOCARDIOGRAM REPORT   Patient Name:   Stephen Copeland Date of Exam: 07/10/2019 Medical Rec #:  [id number]    Height:       72.0 in Accession #:    [id number]   Weight:       185.8 lb Date of Birth:  August 09, 1940   BSA:          2.065 m Patient Age:    79 years     BP:           146/58 mmHg Patient Gender: M            HR:           100 bpm. Exam Location:  Inpatient Procedure: 2D Echo, Color Doppler and Cardiac Doppler Indications:    Stroke i163.9  History:        Patient has prior history of Echocardiogram examinations, most                 recent 12/20/2016. CHF, Pulmonary HTN, Arrythmias:Atrial                 Fibrillation; Risk Factors:Hypertension and Dyslipidemia.  Sonographer:    Raquel Sarna Senior RDCS Referring Phys: [id number] ASHISH ARORA  Sonographer Comments: Technically difficult study. Patient supine on artificial respirator with very respiratory windows. IMPRESSIONS  1. Left ventricular ejection fraction, by estimation, is 35 to 40%. The left ventricle has moderately decreased function. The left ventricle demonstrates global hypokinesis. There is mild concentric left ventricular hypertrophy. Left ventricular diastolic function could not be evaluated.  2. Right ventricular systolic function is normal. The right ventricular size is normal. There is normal pulmonary artery systolic pressure.  3. The mitral valve was not well visualized. No evidence of mitral valve regurgitation.  4. The aortic  valve was not well visualized. Aortic valve regurgitation is not visualized. Conclusion(s)/Recommendation(s): Very limited study: extremely limited windows, most valves not visualized, and only fragments of LV seen. EF appears moderately decreased but formal evaluation cannot be done based on images. Recommend TEE to evaluate for  cardiac source of embolus if clinically indicated. FINDINGS  Left Ventricle: Left ventricular ejection fraction, by estimation, is 35 to 40%. The left ventricle has  moderately decreased function. The left ventricle demonstrates global hypokinesis. The left ventricular internal cavity size was normal in size. There is mild concentric left ventricular hypertrophy. Left ventricular diastolic function could not be evaluated. Right Ventricle: The right ventricular size is normal. No increase in right ventricular wall thickness. Right ventricular systolic function is normal. There is normal pulmonary artery systolic pressure. The tricuspid regurgitant velocity is 2.29 m/s, and  with an assumed right atrial pressure of 3 mmHg, the estimated right ventricular systolic pressure is 70.0 mmHg. Left Atrium: Left atrial size was not well visualized. Right Atrium: Right atrial size was normal in size. Pericardium: A small pericardial effusion is present. Mitral Valve: The mitral valve was not well visualized. No evidence of mitral valve regurgitation. Tricuspid Valve: The tricuspid valve is not well visualized. Tricuspid valve regurgitation is trivial. Aortic Valve: The aortic valve was not well visualized. Aortic valve regurgitation is not visualized. Pulmonic Valve: The pulmonic valve was not well visualized. Pulmonic valve regurgitation is not visualized. Aorta: The aortic root is normal in size and structure. Venous: IVC assessment for right atrial pressure unable to be performed due to mechanical ventilation. IAS/Shunts: The interatrial septum was not well visualized.  LEFT VENTRICLE PLAX 2D LVIDd:          3.80 cm LVIDs:         3.30 cm LV PW:         1.30 cm LV IVS:        1.30 cm LVOT diam:     2.30 cm LV SV:         93 LV SV Index:   45 LVOT Area:     4.15 cm  RIGHT VENTRICLE RV S prime:     20.00 cm/s TAPSE (M-mode): 2.1 cm LEFT ATRIUM             Index       RIGHT ATRIUM           Index LA diam:        4.30 cm 2.08 cm/m  RA Area:     23.80 cm LA Vol (A2C):   82.6 ml 40.00 ml/m RA Volume:   74.40 ml  36.03 ml/m LA Vol (A4C):   55.5 ml 26.87 ml/m LA Biplane Vol: 73.4 ml 35.54 ml/m  AORTIC VALVE LVOT Vmax:   119.00 cm/s LVOT Vmean:  89.400 cm/s LVOT VTI:    0.224 m  AORTA Ao Root diam: 3.60 cm Ao Asc diam:  3.70 cm TRICUSPID VALVE TR Peak grad:   21.0 mmHg TR Vmax:        229.00 cm/s  SHUNTS Systemic VTI:  0.22 m Systemic Diam: 2.30 cm Buford Dresser MD Electronically signed by Buford Dresser MD Signature Date/Time: 07/10/2019/10:36:36 AM    Final    CT HEAD CODE STROKE WO CONTRAST  Result Date: 07/09/2019 EXAM: CT ANGIOGRAPHY HEAD AND NECK CT PERFUSION BRAIN TECHNIQUE: Multidetector CT imaging of the head and neck was performed using the standard protocol during bolus administration of intravenous contrast. Multiplanar CT image reconstructions and MIPs were obtained to evaluate the vascular anatomy. Carotid stenosis measurements (when applicable) are obtained utilizing NASCET criteria, using the distal internal carotid diameter as the denominator. Multiphase CT imaging of the brain was performed following IV bolus contrast injection. Subsequent parametric perfusion maps were calculated using RAPID software. CONTRAST:  100 cc of Omni 350. COMPARISON:  None available. FINDINGS: CT HEAD FINDINGS Brain: Age-related cerebral atrophy with mild chronic small vessel ischemic  disease. Patchy evolving hypodensity seen involving the right insula, with extension to involve the overlying supra ganglionic right cerebral cortex, consistent with evolving acute right MCA territory infarct. Relative  sparing of the deep gray nuclei and internal capsule. No acute intracranial hemorrhage. No other acute large vessel territory infarct. No mass lesion or midline shift. No hydrocephalus or extra-axial fluid collection. Vascular: Asymmetric hyperdensity seen involving a proximal right M2 branch at the base of the right sylvian fissure, concerning for LV of (series 2, image 18). Skull: Scalp soft tissues and calvarium within normal limits. Sinuses/Orbits: Globes and orbital soft tissues within normal limits. Paranasal sinuses and mastoid air cells are clear. Other: None. ASPECTS ([place] Stroke Program Early CT Score) - Ganglionic level infarction (caudate, lentiform nuclei, internal capsule, insula, M1-M3 cortex): 6 - Supraganglionic infarction (M4-M6 cortex): 1 Total score (0-10 with 10 being normal): 7 Review of the MIP images confirms the above findings CTA NECK FINDINGS Aortic arch: Visualized aortic arch normal in caliber with normal 3 vessel morphology. Mild-to-moderate scattered plaque about the arch and origin of the great vessels without high-grade stenosis. Atheromatous plaque at the origin of the right subclavian artery with short-segment 60% stenosis. Visualized subclavian arteries otherwise patent. Right carotid system: Right CCA mildly tortuous but widely patent to the bifurcation without stenosis. 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 A radiographic string sign is present. Plaque is predominantly hypodense in nature, suspected to reflect thrombus, and favored to be acute in nature, likely reflecting a ruptured plaque. Distally, right ICA is diminutive in caliber with markedly attenuated flow but remains grossly patent to the bifurcation. An underlying superimposed dissection could be present. Left carotid system: Left CCA patent from its origin to the bifurcation without flow-limiting stenosis. Bulky  calcified plaque about the left bifurcation with relatively mild 30% stenosis by NASCET criteria. Left ICA mildly tortuous but otherwise widely patent distally to the skull base without stenosis, dissection or occlusion. Vertebral arteries: Both vertebral arteries arise from the subclavian arteries. Atheromatous plaque at the origin of the vertebral arteries with associated moderate approximate 50% stenosis bilaterally. Vertebral arteries tortuous but otherwise widely patent to the skull base without stenosis, dissection or occlusion. Skeleton: No acute osseous abnormality. No discrete or worrisome osseous lesions. Moderate multilevel cervical spondylosis, most pronounced at C5-6 and C6-7. Other neck: No other acute soft tissue abnormality within the neck. No mass lesion or adenopathy. Upper chest: Visualized upper chest demonstrates no acute finding. Punctate calcified granuloma noted within the right upper lobe. Review of the MIP images confirms the above findings CTA HEAD FINDINGS Anterior circulation: Petrous left ICA widely patent. Mild atheromatous change within the cavernous/supraclinoid left ICA without flow-limiting stenosis. Faint opacification of the right ICA to the level of the terminus. Both A1 segments well perfused via collateral flow across the circle-of-Willis. Widely patent anterior communicating artery. Both ACAs well perfused and patent to their distal aspects. Right M1 segment perfused and widely patent. Normal right MCA bifurcation. There is abrupt occlusion of a proximal right M2 and/or M3 branch just distal to the right MCA bifurcation (series 7, image 64). This arises from the superior division. Remainder of the right MCA branches are perfused distally. Left M1 widely patent. Normal left MCA bifurcation. Distal left MCA branches well perfused without occlusion. Posterior circulation: Both vertebral arteries widely patent to the vertebrobasilar junction without stenosis. Both picas patent.  Basilar widely patent to its distal  aspect without stenosis. Superior cerebral arteries patent bilaterally. Both PCAs primarily supplied via the basilar. Single short-segment moderate distal left P2 stenosis noted (series 8, image 14). PCAs otherwise widely patent to their distal aspects. Venous sinuses: Visualized major dural sinuses are grossly patent allowing for timing the contrast bolus. Anatomic variants: None significant. Review of the MIP images confirms the above findings CT Brain Perfusion Findings: ASPECTS: 7 CBF (<30%) Volume: 74mL Perfusion (Tmax>6.0s) volume: 59mL Mismatch Volume: 80mL Infarction Location:Acute core infarct seen involving the right insula and overlying supra ganglionic mid and posterior right frontal lobe. Mild surrounding ischemic penumbra. IMPRESSION: CT HEAD IMPRESSION: 1. 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 No associated hemorrhage. 2. Hyperdense proximal right M2 and/or M3 vessel at the base of the right sylvian fissure, concerning for LVO. 3. Aspects = 7. CTA HEAD AND NECK IMPRESSION: 1. Positive CTA for large vessel occlusion. 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 Markedly attenuated and diminutive flow distally to the right ICA terminus. Possible superimposed dissection may be present. 2. Distal reconstitution of the right M1 segment via collateral flow across the circle-of-Willis but with distal embolic occlusion of a proximal right M2/M3 branch, superior division as above. 3. Atheromatous change about the left carotid bifurcation and left carotid siphon without high-grade stenosis. 4. 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 5. 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 CT PERFUSION IMPRESSION: Positive CT perfusion with 19 cc core infarct involving the right  insula and overlying supra ganglionic right mid and posterior frontal region. 33 cc surrounding ischemic penumbra. Critical Value/emergent results were called by telephone at the time of interpretation on 07/09/2019 at 8:49 p.m. and again at 9:09 pm to provider [facility] , who verbally acknowledged these results. Electronically Signed   By: Jeannine Boga M.D.   On: 07/09/2019 21:41   VAS US CAROTID  Result Date: 07/10/2019 Carotid Arterial Duplex Study Indications: Follow up right ICA stent. Limitations  Today's exam was limited due to the patient's inability or              unwillingness to cooperate. Performing Technologist: Maudry Mayhew MHA, RDMS, RVT, RDCS  Examination Guidelines: A complete evaluation includes B-mode imaging, spectral Doppler, color Doppler, and power Doppler as needed of all accessible portions of each vessel. Bilateral testing is considered an integral part of a complete examination. Limited examinations for reoccurring indications may be performed as noted.  Right Carotid Findings: +----------+--------+--------+--------+------------------+--------+           PSV cm/sEDV cm/sStenosisPlaque DescriptionComments +----------+--------+--------+--------+------------------+--------+ CCA Prox  86      11                                         +----------+--------+--------+--------+------------------+--------+ ICA Distal88      12                                         +----------+--------+--------+--------+------------------+--------+  Right Stent(s): +---------------+---+--++++ Prox to Stent  81 12 +---------------+---+--++++ Proximal Stent 10413 +---------------+---+--++++ Mid Stent      11725 +---------------+---+--++++ Distal Stent   11716 +---------------+---+--++++ Distal to [id number] +---------------+---+--++++    Summary: Right Carotid:  Right ICA stent appears to be widely patent with no evidence of                 obstruction.  *See table(s) above for measurements and observations.  Electronically signed by Deitra Mayo MD on 07/10/2019 at 7:25:19 PM.   Final     Labs:  CBC: Recent Labs    04/14/19 1326 07/09/19 2031 07/09/19 2031 07/09/19 2054 07/10/19 9563 07/10/19 0259 07/11/19 0540  WBC 5.8 8.1  --   --   --  11.2* 9.1  HGB 14.3 15.0   < > 15.0 11.6* 12.3* 10.9*  HCT 41.7 45.8   < > 44.0 34.0* 38.0* 33.6*  PLT 252 264  --   --   --  163 183   < > = values in this interval not displayed.    COAGS: Recent Labs    07/09/19 2031  INR 1.4*  APTT 38*    BMP: Recent Labs    04/14/19 1326 04/14/19 1326 07/09/19 2031 07/09/19 2031 07/09/19 2054 07/10/19 0208 07/10/19 0259 07/11/19 0540  NA 144  --  143   < > 145 143 141 142  K 4.6   < > 3.8   < > 3.7 3.3* 3.4* 3.5  CL 109*   < > 109  --  110  --  112* 113*  CO2 22  --  20*  --   --   --  15* 19*  GLUCOSE 102*  --  129*  --  123*  --  176* 136*  BUN 28*  --  29*  --  30*  --  24* 20  CALCIUM 10.0  --  9.9  --   --   --  8.3* 8.4*  CREATININE 1.76*   < > 1.71*  --  1.60*  --  1.56* 1.45*  GFRNONAA 36*  --  38*  --   --   --  42* 46*  GFRAA 42*  --  43*  --   --   --  49* 53*   < > = values in this interval not displayed.    LIVER FUNCTION TESTS: Recent Labs    04/14/19 1326 07/09/19 2031  BILITOT 0.8 1.3*  AST 17 19  ALT 13 14  ALKPHOS 108 102  PROT 6.9 7.3  ALBUMIN 4.1 3.9    Assessment and Plan:  CVA R MCA revascularization 5/6 Improving movement and mentation  P2y12 64 5/7 Check again today Plan per Neurology   Electronically Signed: Lavonia Drafts, PA-C 07/11/2019, 7:23 AM   I spent a total of 15 Minutes at the the patient's bedside AND on the patient's hospital floor or unit, greater than 50% of which was counseling/coordinating care for CVA/ RMCA revascularization

## 2019-07-11 NOTE — Progress Notes (Signed)
STROKE TEAM PROGRESS NOTE   INTERVAL HISTORY The patient is post thrombectomy about 36 hours.  Blood pressure has been stabilized with IV drip.  This be discontinued and we will see how he does with his blood pressure.  Exam is mostly unchanged.    Vitals:   07/11/19 1030 07/11/19 1045 07/11/19 1100 07/11/19 1200  BP: 132/62 132/60 (!) 146/62 (!) 155/69  Pulse: (!) 56 (!) 55 63 66  Resp: 14 12 16 16   Temp:    98.2 F (36.8 C)  TempSrc:    Oral  SpO2: 98% 98% 99% 98%  Weight:      Height:        CBC:  Recent Labs  Lab 07/09/19 2031 07/09/19 2054 07/10/19 0259 07/11/19 0540  WBC 8.1   < > 11.2* 9.1  NEUTROABS 5.4  --  9.4*  --   HGB 15.0   < > 12.3* 10.9*  HCT 45.8   < > 38.0* 33.6*  MCV 95.8   < > 96.9 97.4  PLT 264   < > 163 183   < > = values in this interval not displayed.    Basic Metabolic Panel:  Recent Labs  Lab 07/10/19 0259 07/10/19 1645 07/11/19 0540  NA 141  --  142  K 3.4*  --  3.5  CL 112*  --  113*  CO2 15*  --  19*  GLUCOSE 176*  --  136*  BUN 24*  --  20  CREATININE 1.56*  --  1.45*  CALCIUM 8.3*  --  8.4*  MG  --  1.7 1.8  PHOS  --  3.2 2.1*   Lipid Panel:     Component Value Date/Time   CHOL 141 07/10/2019 0259   TRIG 111 07/10/2019 0259   TRIG 113 07/10/2019 0259   HDL 29 (L) 07/10/2019 0259   CHOLHDL 4.9 07/10/2019 0259   VLDL 22 07/10/2019 0259   LDLCALC 90 07/10/2019 0259   HgbA1c:  Lab Results  Component Value Date   HGBA1C 5.8 (H) 07/10/2019   Urine Drug Screen:     Component Value Date/Time   LABOPIA NONE DETECTED 07/10/2019 0121   COCAINSCRNUR NONE DETECTED 07/10/2019 0121   LABBENZ NONE DETECTED 07/10/2019 0121   AMPHETMU NONE DETECTED 07/10/2019 0121   THCU NONE DETECTED 07/10/2019 0121   LABBARB NONE DETECTED 07/10/2019 0121    Alcohol Level     Component Value Date/Time   ETH <10 07/10/2019 0259    IMAGING past 24 hours  DG Abd 1 View 07/10/2019 IMPRESSION:  Distal tip of nasogastric tube seen in  proximal stomach. No radiopaque metallic density is noted.   MR BRAIN WO CONTRAST MR ANGIO HEAD WO CONTRAST 07/10/2019 IMPRESSION:  1. Patchy acute right MCA infarcts.  2. Subarachnoid hemorrhage greatest in the right sylvian fissure as seen on earlier CT.  3. Small volume intraventricular hemorrhage.  4. Severely motion degraded head MRA. Persistent patency of the revascularized right ICA. No flow limiting proximal stenosis identified.   ECHOCARDIOGRAM COMPLETE 07/10/2019 IMPRESSIONS   1. Left ventricular ejection fraction, by estimation, is 35 to 40%. The left ventricle has moderately decreased function. The left ventricle demonstrates global hypokinesis. There is mild concentric left ventricular hypertrophy. Left ventricular diastolic function could not be evaluated.   2. Right ventricular systolic function is normal. The right ventricular size is normal. There is normal pulmonary artery systolic pressure.   3. The mitral valve was not well visualized. No evidence of  mitral valve regurgitation.   4. The aortic valve was not well visualized. Aortic valve regurgitation is not visualized.  Conclusion(s)/Recommendation(s): Very limited study: extremely limited windows, most valves not visualized, and only fragments of LV seen. EF appears moderately decreased but formal evaluation cannot be done based on images. Recommend TEE to evaluate for  cardiac source of embolus if clinically indicated.    VAS US CAROTID 07/10/2019 Summary:  Right Carotid: Right ICA stent appears to be widely patent with no evidence of obstruction.      PHYSICAL EXAM   Temp:  [98.2 F (36.8 C)-98.4 F (36.9 C)] 98.2 F (36.8 C) (05/08 1200) Pulse Rate:  [40-110] 66 (05/08 1200) Resp:  [11-24] 16 (05/08 1200) BP: (99-155)/(45-133) 155/69 (05/08 1200) SpO2:  [92 %-100 %] 98 % (05/08 1200) Arterial Line BP: (111-181)/(34-78) 140/59 (05/08 0615)  General - Well nourished, well developed, in no apparent  distress.  Ophthalmologic - fundi not visualized due to noncooperation.  Cardiovascular - Regular rhythm and rate, but frequent PVCs on tele.  Neuro - Lethargic but orientation to time, place, and age were intact. Language including expression, naming, repetition, comprehension was assessed and found intact, however paucity of speech and moderate to severe dysarthria. Visual field full, no hemianopia. Gaze bilaterally without difficulty, PERRL. Tracking bilaterally. Left facial droop. Tongue midline. RUE 4/5 but LUE 3-/5 drift to bed within 10 second, finger grip 3/5. RLE 3/5 and LLE 2/5 proximal and 2/5 left foot DF/PF. DTR 1+ and no babinski. Sensation symmetrical. R FTN intact but slow, L FTN not able to test due to weakness. Gait not tested.    ASSESSMENT/PLAN [name] is a 79 y.o. male with history of Afib, systolic CHF, HLD, CAD, HTN, prostate cancer presenting with left sided weakness, facial droop, dysarthria.   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 embolic secondary to known AF even on Eliquis  Code Stroke CT head evolving R MCA territory infarct (insula, cerebral cortex). hyperdense R M2 +/- M3. ASPECTS 7    CTA head & neck +LVO. Probable ruptured plaque R ICA bifurcation w/ near occlusive stenosis, possible superimposed dissection. Distal reconstitution R M1 from collaterals but w/ distal embolic occlusion proximal R M2/3 superior division branch. L ICA bifurcation and siphon atherosclerosis. B VA origins 50% stenoses. R subclavian artery origin 60% stenosis.   CT perfusion 19cc core infarct R insula and posterior frontal. 33cc penumbra.   Cerebral angio TICI3 revascularization occluded middle trifurcation R MCA branch, revascularization R ICA occlusion w/ angioplasty and stenting.   Post IR CT contrast stain R putamen and moderate R sylvian  fissure, SAH + contrast.    CT head R sylvian fissure SAH. Increased contrast vs hemorrhage R suprasellar cistern following ICA concerned for increased SAH. R sulci effacement.   Carotid Doppler  R ICA stent patent  MRI  Patchy R MCA infarcts. SAH R sylvian fissure. Small IVH.   MRA - Severely motion degraded head MRA. Persistent patency of the revascularized right ICA. No flow limiting proximal stenosis identified.   2D Echo EF 35-40%. No source of embolus   LDL 90  HgbA1c 5.8  SCDs for VTE prophylaxis  Eliquis (apixaban) daily prior to admission, now on aspirin 81 mg daily and Brilinta (ticagrelor) 90 mg bid. Received during IR. Continue DAPT administration in setting of SAH given new ICA stent.  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 Hold AC given post IR SAH and need for DAPT post stent placement. Possibly may due apixaban and a single antiplatelet stent and afib. . Consider AC once SAH resolves.    Hypertension  Home meds:  Hydralazine 50 tid, metoprolol 25 bid  Resumed metoprolol 12.5 bid  Stable . BP goal 120-140 within 24h post IR (BP 116/63) . Long-term BP goal normotensive . IV drip will be held and the patient will be observed.  As needed doses of hydralazine will be initiated.  Consider transfer if blood pressure stabilizes of the IV drip.  Hyperlipidemia  Home meds:  No statin  LDL 90, goal < 70  May consider statin again ->pt adamantly has refused statins in the past  Cardiomyopathy  EF 30 to 40% since 2017  This admission EF 35 to 40%  CXR no pulmonary edema  Dysphagia . Secondary to  stroke . NPO . Did not pass swallow eval . Speech on board . Cortrak placement  . Tube feedings  Acute hypoxemic respiratory failure  Possible aspiration - currently not on antibiotics - afebrile - WBC's decreasing  CCM on board    Other Stroke Risk Factors  Advanced age  Coronary artery disease  Chronic systolic Congestive heart failure  Other Active Problems  GERD on PPI   BPH on flomax PTA  Prostate cancer   Hypokalemia K 3.3->3.4 - supplement->3.5 monitor  AKI Cre 1.6->1.56. on IVF - KVO ->1.45  LBBB. Appears old. EKG neg ischemia. Trops 27->32  (Likely strain related)  Mild leukocytosis, likely reactive WBC 11.2->9.1 (afebrile)  Mild acute blood loss anemia Hb 12.3->10.9 monitor  Hypophosphatemia - 3.2->2.1  Daily labs            To contact Stroke Continuity provider, please refer to [email or web address] After hours, contact General Neurology

## 2019-07-11 NOTE — Progress Notes (Signed)
Inpatient Rehab Admissions:  Inpatient Rehab Consult received.  I met with pt at the bedside for rehabilitation assessment and to discuss goals and expectations of an inpatient rehab admission. Pt was being transferred to another room in hospital. Pt gave permission for [facility] to contact wife to discuss CIR goals and expectations. Contacted wife, Delia via phone.  Anticipate pt will need continued physical assistance after potential CIR admission.  Will continue discussions with family to determine if can provide support needed.  Gayland Curry, [place], [place] Admissions Coordinator [phone]

## 2019-07-11 NOTE — Progress Notes (Signed)
Per Dr. Merlene Laughter SBP<160

## 2019-07-11 NOTE — Progress Notes (Signed)
PCCM: Patient was extubated 07/10/2019.  Stable 4 L nasal cannula. PCCM will sign off at this time.  Please do not hesitate to call with any questions. Garner Nash, DO [facility] 07/11/2019 10:26 AM

## 2019-07-11 NOTE — Progress Notes (Signed)
    p2y12  91 today Discussed with Dr Estanislado Pandy Rec: Continue ASA 81 mg and Brilinta 90 mg BID

## 2019-07-12 LAB — BASIC METABOLIC PANEL
Anion gap: 9 (ref 5–15)
BUN: 25 mg/dL — ABNORMAL HIGH (ref 8–23)
CO2: 21 mmol/L — ABNORMAL LOW (ref 22–32)
Calcium: 8.8 mg/dL — ABNORMAL LOW (ref 8.9–10.3)
Chloride: 110 mmol/L (ref 98–111)
Creatinine, Ser: 1.33 mg/dL — ABNORMAL HIGH (ref 0.61–1.24)
GFR calc Af Amer: 59 mL/min — ABNORMAL LOW (ref >60)
GFR calc non Af Amer: 51 mL/min — ABNORMAL LOW (ref >60)
Glucose, Bld: 151 mg/dL — ABNORMAL HIGH (ref 70–99)
Potassium: 4.4 mmol/L (ref 3.5–5.1)
Sodium: 140 mmol/L (ref 135–145)

## 2019-07-12 LAB — GLUCOSE, CAPILLARY
Glucose-Capillary: 109 mg/dL — ABNORMAL HIGH (ref 70–99)
Glucose-Capillary: 123 mg/dL — ABNORMAL HIGH (ref 70–99)
Glucose-Capillary: 123 mg/dL — ABNORMAL HIGH (ref 70–99)
Glucose-Capillary: 127 mg/dL — ABNORMAL HIGH (ref 70–99)
Glucose-Capillary: 134 mg/dL — ABNORMAL HIGH (ref 70–99)
Glucose-Capillary: 142 mg/dL — ABNORMAL HIGH (ref 70–99)
Glucose-Capillary: 150 mg/dL — ABNORMAL HIGH (ref 70–99)

## 2019-07-12 LAB — CBC
HCT: 37 % — ABNORMAL LOW (ref 39.0–52.0)
Hemoglobin: 12.2 g/dL — ABNORMAL LOW (ref 13.0–17.0)
MCH: 31.6 pg (ref 26.0–34.0)
MCHC: 33 g/dL (ref 30.0–36.0)
MCV: 95.9 fL (ref 80.0–100.0)
Platelets: 209 10*3/uL (ref 150–400)
RBC: 3.86 MIL/uL — ABNORMAL LOW (ref 4.22–5.81)
RDW: 14 % (ref 11.5–15.5)
WBC: 11.5 10*3/uL — ABNORMAL HIGH (ref 4.0–10.5)
nRBC: 0 % (ref 0.0–0.2)

## 2019-07-12 NOTE — Evaluation (Signed)
Speech Language Pathology Evaluation Patient Details Name: Stephen Copeland MRN: [id number] DOB: 12-Mar-1940 Today's Date: 07/12/2019 Time: 1202-1210 SLP Time Calculation (min) (ACUTE ONLY): 8 min  Problem List:  Patient Active Problem List   Diagnosis Date Noted  . 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 Acute hypoxemic respiratory failure ([place])   . Acute ischemic stroke ([place]) 07/09/2019  . Vasovagal syncope 07/18/2017  . S/P TURP 07/18/2017  . MRSA bacteremia 07/07/2017  . Pseudomonas sepsis ([place]) 07/07/2017  . Pseudomonas urinary tract infection 07/07/2017  . Coronary artery disease 07/07/2017  . 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 Encounter for therapeutic drug monitoring 04/09/2017  . Right lower lobe pulmonary nodule 12/19/2016  . Noncompliance with treatment plan 12/19/2016  . Bacteremia due to Enterococcus 12/19/2016  . Enterococcus UTI 12/19/2016  . Acute renal failure (ARF) ([place]) 12/18/2016  . Hyperkalemia 12/18/2016  . Hyperbilirubinemia 12/18/2016  . UTI (urinary tract infection) 05/31/2016  . Acute urinary retention 05/31/2016  . Acute kidney injury superimposed on chronic kidney disease ([place]) 05/31/2016  . Generalized weakness 05/31/2016  . Falls 05/31/2016  . Acute systolic CHF (congestive heart failure) ([place])   . Congestive dilated cardiomyopathy ([place])   . Atrial fibrillation with RVR ([place])   . Uncontrolled hypertension   . Pulmonary hypertension ([place])   . Chronic systolic CHF (congestive heart failure) ([place])   . Paroxysmal atrial fibrillation ([place]) 12/19/2015  . Hypertension 12/19/2015  . New onset a-fib ([place]) 12/19/2015  . Acute CHF ([place]) 12/19/2015  . Hyperglycemia 12/19/2015  . Acid reflux   . Hyperlipidemia   . Sinus bradycardia   . Gastroesophageal reflux disease   . Other hyperlipidemia   . Elevated troponin    Past Medical History:  Past Medical History:  Diagnosis Date  . Acid reflux   . CHF (congestive  heart failure) ([place])   . Coronary artery disease   . Dilated cardiomyopathy ([place])   . Folliculitis   . Gastric ulcer   . Hiatal hernia   . Hyperlipidemia    a. pt is adamantly against statins.  . Hypertension   . Ischemia    a. Pt states he was diagnosed with "ischemia" in the 1990s but does not know further details, denies hx of heart blockage.  . Nummular dermatitis   . Prostate cancer ([place])   . Renal disorder   . Scoliosis   . Sinus bradycardia   . Stomach ulcer    a. remote ulcer in the 1990s, no hx of bleeding.   Past Surgical History:  Past Surgical History:  Procedure Laterality Date  . INGUINAL HERNIA REPAIR Left   . RADIOLOGY WITH ANESTHESIA N/A 07/09/2019   Procedure: IR WITH ANESTHESIA;  Surgeon: Luanne Bras, MD;  Location: [place];  Service: Radiology;  Laterality: N/A;   HPI:  79 y.o. male PMH of Afib, systolic CHF, HLD, CAD, HTN, GERD, prostate ca, presenting to ER with complaints of left sided weakness. CT head acute ischemic right MCA territory infarct involving right insula and overlying supraganglionic R cerebral cortex.  Underwent Rt ICA stenting and Rt MCA thrombectomy 5/7.   Assessment / Plan / Recommendation Clinical Impression  Pt was seen for a cognitive-linguistic evaluation in the setting of R MCA infarcts and thrombectomy.  Evaluation was limited secondary to pt's level of participation.  He reported that he lives with his wife and that he is independent with IADLs (medications, finances, etc.) at baseline.  He additionally stated that he could tell that  his cognitive-linguistic abilities were different from baseline, but he did not wish to participate in many of the evaluation tasks at this time despite verbal encouragement.  Pt was oriented x4 to self, place, month, and city.  He demonstrated impulsiveness and decreased sustained/focused attention throughout this evaluation.  Additionally suspect deficits in executive functioning and short-term memory.   Pt's receptive language appeared functional to limited evaluation, but he demonstrated some deficits in expressive language evidenced by difficulty with repetition and confrontational naming.  Pt additionally presents with moderate dysarthria c/b reduced articulation and decreased speech intelligibility at the word, phrase, and sentence level.  Speech was approximately 25% intelligible at the word level and it dropped to approximately 15% at the phrase and sentence level.  Pt benefited from cues to over-articulate words.  Recommend additional ST (CIR) at time of discharge.  SLP will f/u acutely for diagnostic treatment.      SLP Assessment  SLP Recommendation/Assessment: Patient needs continued Speech Lanaguage Pathology Services SLP Visit Diagnosis: Cognitive communication deficit (R41.841)    Follow Up Recommendations  Inpatient Rehab    Frequency and Duration min 2x/week  2 weeks      SLP Evaluation Cognition  Overall Cognitive Status: Impaired/Different from baseline Arousal/Alertness: Awake/alert Orientation Level: Oriented X4 Attention: Sustained Sustained Attention: Impaired Sustained Attention Impairment: Functional complex Behaviors: Impulsive       Comprehension  Auditory Comprehension Overall Auditory Comprehension: Appears within functional limits for tasks assessed Conversation: Complex    Expression Expression Primary Mode of Expression: Verbal Verbal Expression Overall Verbal Expression: Impaired Initiation: No impairment Level of Generative/Spontaneous Verbalization: Conversation Repetition: Impaired Level of Impairment: Word level Naming: Impairment Responsive: Not tested Confrontation: Impaired Convergent: 75-100% accurate Divergent: Not tested Verbal Errors: Phonemic paraphasias Pragmatics: Impairment Impairments: Turn Taking Written Expression Dominant Hand: Right   Oral / Motor  Oral Motor/Sensory Function Overall Oral Motor/Sensory Function:  Moderate impairment Facial ROM: Reduced left;Suspected CN VII (facial) dysfunction Facial Symmetry: Abnormal symmetry left;Suspected CN VII (facial) dysfunction Lingual Symmetry: Abnormal symmetry left;Suspected CN XII (hypoglossal) dysfunction Motor Speech Overall Motor Speech: Impaired Respiration: Within functional limits Phonation: Normal Articulation: Impaired Level of Impairment: Word Intelligibility: Intelligibility reduced Word: 25-49% accurate Phrase: 0-24% accurate Sentence: 0-24% accurate Conversation: 0-24% accurate Motor Planning: Not tested   GO                   Colin Mulders., M.S., ZRA-QTM Acute Rehabilitation Services Office: [phone]  [place] 07/12/2019, 1:11 PM

## 2019-07-12 NOTE — Progress Notes (Signed)
  Speech Language Pathology Treatment: Dysphagia  Patient Details Name: Stephen Copeland MRN: [id number] DOB: 03-Aug-1940 Today's Date: 07/12/2019 Time: 1202-1210 SLP Time Calculation (min) (ACUTE ONLY): 8 min  Assessment / Plan / Recommendation Clinical Impression  Pt was seen for skilled ST targeting diagnostic treatment.  Pt was encountered awake/alert in bed with Cortrak NG tube in place.  Pt was seen with 1 tsp of thin liquid given verbal encouragement.  Brief oral holding was observed with suspected delayed swallow initiation.  No overt s/sx of aspiration were observed.  Pt refused additional PO trials despite verbal encouragement and being given multiple choices of liquids/solids.  Therefore, recommend continuation of NPO with frequent oral care and medications administered via alternative means.  Pt may have a few small ice chips or spoon sips of water following thorough oral care given full RN supervision.  SLP will f/u to determine readiness for clinical diet initiation vs instrumental swallow study.    HPI HPI: 79 y.o. male PMH of Afib, systolic CHF, HLD, CAD, HTN, GERD, prostate ca, presenting to ER with complaints of left sided weakness. CT head acute ischemic right MCA territory infarct involving right insula and overlying supraganglionic R cerebral cortex.  Underwent Rt ICA stenting and Rt MCA thrombectomy 5/7.      SLP Plan  Continue with current plan of care  Patient needs continued Speech Lanaguage Pathology Services    Recommendations  Diet recommendations: NPO Medication Administration: Via alternative means                Oral Care Recommendations: Oral care QID;Staff/trained caregiver to provide oral care Follow up Recommendations: Inpatient Rehab SLP Visit Diagnosis: Dysphagia, unspecified (R13.10) Plan: Continue with current plan of care       [place]., M.S., [place] Acute Rehabilitation Services Office: [phone]   [place] 07/12/2019, 1:19 PM

## 2019-07-12 NOTE — Progress Notes (Signed)
Inpatient Rehab Admissions Coordinator:  Saw pt at bedside to explain that he could potentially admit to CIR for a reduced burden of care d/t wife being able to provide 24/7 supervision but not being able to provide physical assistance for pt.  Pt indicated that he would go along with whatever decision his wife made regarding d/c plans.  Spoke to wife, Delia via phone regarding potential admit to CIR for reduced burden of care.  She will think it over and give me a call back in a few days to let me know her decision.    Gayland Curry, [place], [place] Admissions Coordinator [phone]

## 2019-07-12 NOTE — Progress Notes (Signed)
STROKE TEAM PROGRESS NOTE   INTERVAL HISTORY He continues to have a lot of complaints about pain in the upper leg regions and low back area along with neck pain.   Vitals:   07/12/19 0700 07/12/19 0813 07/12/19 1226 07/12/19 1644  BP:  (!) 154/117 (!) 162/84 (!) 156/68  Pulse:  100 76 89  Resp:  17 18 17   Temp:  99.8 F (37.7 C) 99.5 F (37.5 C) 99.1 F (37.3 C)  TempSrc:  Oral Axillary Axillary  SpO2:  96% 99% 98%  Weight: 86.9 kg     Height:        CBC:  Recent Labs  Lab 07/09/19 2031 07/09/19 2054 07/10/19 0259 07/10/19 0259 07/11/19 0540 07/12/19 0659  WBC 8.1   < > 11.2*   < > 9.1 11.5*  NEUTROABS 5.4  --  9.4*  --   --   --   HGB 15.0   < > 12.3*   < > 10.9* 12.2*  HCT 45.8   < > 38.0*   < > 33.6* 37.0*  MCV 95.8   < > 96.9   < > 97.4 95.9  PLT 264   < > 163   < > 183 209   < > = values in this interval not displayed.    Basic Metabolic Panel:  Recent Labs  Lab 07/11/19 0540 07/11/19 1603 07/12/19 0659  NA 142  --  140  K 3.5  --  4.4  CL 113*  --  110  CO2 19*  --  21*  GLUCOSE 136*  --  151*  BUN 20  --  25*  CREATININE 1.45*  --  1.33*  CALCIUM 8.4*  --  8.8*  MG 1.8 1.8  --   PHOS 2.1* 2.4*  --    Lipid Panel:     Component Value Date/Time   CHOL 141 07/10/2019 0259   TRIG 111 07/10/2019 0259   TRIG 113 07/10/2019 0259   HDL 29 (L) 07/10/2019 0259   CHOLHDL 4.9 07/10/2019 0259   VLDL 22 07/10/2019 0259   LDLCALC 90 07/10/2019 0259   HgbA1c:  Lab Results  Component Value Date   HGBA1C 5.8 (H) 07/10/2019   Urine Drug Screen:     Component Value Date/Time   LABOPIA NONE DETECTED 07/10/2019 0121   COCAINSCRNUR NONE DETECTED 07/10/2019 0121   LABBENZ NONE DETECTED 07/10/2019 0121   AMPHETMU NONE DETECTED 07/10/2019 0121   THCU NONE DETECTED 07/10/2019 0121   LABBARB NONE DETECTED 07/10/2019 0121    Alcohol Level     Component Value Date/Time   ETH <10 07/10/2019 0259    IMAGING past 24 hours  DG Abd 1  View 07/10/2019 IMPRESSION:  Distal tip of nasogastric tube seen in proximal stomach. No radiopaque metallic density is noted.   MR BRAIN WO CONTRAST MR ANGIO HEAD WO CONTRAST 07/10/2019 IMPRESSION:  1. Patchy acute right MCA infarcts.  2. Subarachnoid hemorrhage greatest in the right sylvian fissure as seen on earlier CT.  3. Small volume intraventricular hemorrhage.  4. Severely motion degraded head MRA. Persistent patency of the revascularized right ICA. No flow limiting proximal stenosis identified.   ECHOCARDIOGRAM COMPLETE 07/10/2019 IMPRESSIONS   1. Left ventricular ejection fraction, by estimation, is 35 to 40%. The left ventricle has moderately decreased function. The left ventricle demonstrates global hypokinesis. There is mild concentric left ventricular hypertrophy. Left ventricular diastolic function could not be evaluated.   2. Right ventricular systolic function is normal.  The right ventricular size is normal. There is normal pulmonary artery systolic pressure.   3. The mitral valve was not well visualized. No evidence of mitral valve regurgitation.   4. The aortic valve was not well visualized. Aortic valve regurgitation is not visualized.  Conclusion(s)/Recommendation(s): Very limited study: extremely limited windows, most valves not visualized, and only fragments of LV seen. EF appears moderately decreased but formal evaluation cannot be done based on images. Recommend TEE to evaluate for  cardiac source of embolus if clinically indicated.    VAS US CAROTID 07/10/2019 Summary:  Right Carotid: Right ICA stent appears to be widely patent with no evidence of obstruction.      PHYSICAL EXAM   Temp:  [98 F (36.7 C)-99.8 F (37.7 C)] 99.1 F (37.3 C) (05/09 1644) Pulse Rate:  [61-100] 89 (05/09 1644) Resp:  [16-18] 17 (05/09 1644) BP: (152-164)/(65-117) 156/68 (05/09 1644) SpO2:  [96 %-100 %] 98 % (05/09 1644) Weight:  [86.9 kg] 86.9 kg (05/09 0700)  General - Well  nourished, well developed, in no apparent distress.  Ophthalmologic - fundi not visualized due to noncooperation.  Cardiovascular - Regular rhythm and rate, but frequent PVCs on tele.  Neuro - Lethargic but orientation to time, place, and age were intact. Language including expression, naming, repetition, comprehension was assessed and found intact, moderate dysarthria. Visual field full, no hemianopia. Gaze bilaterally without difficulty, PERRL. Tracking bilaterally. Left facial droop. Tongue midline. RUE 4/5 but LUE 3-/5 drift to bed within 10 second, finger grip 3/5. RLE 3/5 and LLE 2/5 proximal and 2/5 left foot DF/PF. DTR 1+ and no babinski. Sensation symmetrical. R FTN intact but slow, L FTN not able to test due to weakness. Gait not tested.    ASSESSMENT/PLAN Mr. Eleno Weimar is a 79 y.o. male with history of Afib, systolic CHF, HLD, CAD, HTN, prostate cancer presenting with left sided weakness, facial droop, dysarthria.   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 embolic secondary to known AF even on Eliquis  Code Stroke CT head evolving R MCA territory infarct (insula, cerebral cortex). hyperdense R M2 +/- M3. ASPECTS 7    CTA head & neck +LVO. Probable ruptured plaque R ICA bifurcation w/ near occlusive stenosis, possible superimposed dissection. Distal reconstitution R M1 from collaterals but w/ distal embolic occlusion proximal R M2/3 superior division branch. L ICA bifurcation and siphon atherosclerosis. B VA origins 50% stenoses. R subclavian artery origin 60% stenosis.   CT perfusion 19cc core infarct R insula and posterior frontal. 33cc penumbra.   Cerebral angio TICI3 revascularization occluded middle trifurcation R MCA branch, revascularization R ICA occlusion w/ angioplasty and stenting.   Post IR CT contrast stain R putamen and moderate R sylvian  fissure, SAH + contrast.    CT head R sylvian fissure SAH. Increased contrast vs hemorrhage R suprasellar cistern following ICA concerned for increased SAH. R sulci effacement.   Carotid Doppler  R ICA stent patent  MRI  Patchy R MCA infarcts. SAH R sylvian fissure. Small IVH.   MRA - Severely motion degraded head MRA. Persistent patency of the revascularized right ICA. No flow limiting proximal stenosis identified.   2D Echo EF 35-40%. No source of embolus   LDL 90  HgbA1c 5.8  SCDs for VTE prophylaxis  Eliquis (apixaban) daily prior to admission, now on aspirin 81 mg daily and  Brilinta (ticagrelor) 90 mg bid. Received during IR. Continue DAPT administration in setting of SAH given new ICA stent.   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 Hold AC given post IR SAH and need for DAPT post stent placement. Possibly may due apixaban and a single antiplatelet stent and afib. . Consider AC once SAH resolves.    Hypertension  Home meds:  Hydralazine 50 tid, metoprolol 25 bid  Resumed metoprolol 12.5 bid and Hydralazine 25 mg Q8 hrs - room to go up on both if needed. HR a little fast. May want to increase metoprolol.  Stable . BP goal 120-140 within 24h post IR (BP creeping up) . Long-term BP goal normotensive . IV drip will be held and the patient will be observed.  As needed doses of hydralazine will be initiated.  Consider transfer if blood pressure stabilizes of the IV drip.  Hyperlipidemia  Home meds:  No statin  LDL 90, goal < 70  May consider statin again ->pt adamantly has refused statins in the  past  Cardiomyopathy  EF 30 to 40% since 2017  This admission EF 35 to 40%  CXR no pulmonary edema  Dysphagia . Secondary to stroke . NPO . Did not pass swallow eval . Speech on board . Cortrak placement  . Tube feedings  Acute hypoxemic respiratory failure  Possible aspiration - currently not on antibiotics - afebrile - WBC's decreasing  CCM on board    Other Stroke Risk Factors  Advanced age  Coronary artery disease  Chronic systolic Congestive heart failure  Other Active Problems  GERD on PPI   BPH on flomax PTA  Prostate cancer   Hypokalemia K 3.3->3.4 - supplement->3.5->4.4  AKI Cre 1.6->1.56. on IVF - KVO ->1.45->1.33  LBBB. Appears old. EKG neg ischemia. Trops 27->32  (Likely strain related)  Mild leukocytosis, likely reactive WBC 11.2->9.1->11.5 (T max 99.8)  Mild acute blood loss anemia Hb 12.3->10.9->12.2  Hypophosphatemia - 3.2->2.1   EF 35-40%  Daily labs            To contact Stroke Continuity provider, please refer to [email or web address] After hours, contact General Neurology

## 2019-07-13 ENCOUNTER — Inpatient Hospital Stay (HOSPITAL_COMMUNITY): Payer: Medicare Other

## 2019-07-13 DIAGNOSIS — I639 Cerebral infarction, unspecified: Secondary | ICD-10-CM | POA: Diagnosis not present

## 2019-07-13 DIAGNOSIS — J9601 Acute respiratory failure with hypoxia: Secondary | ICD-10-CM

## 2019-07-13 LAB — BASIC METABOLIC PANEL
Anion gap: 9 (ref 5–15)
BUN: 33 mg/dL — ABNORMAL HIGH (ref 8–23)
CO2: 22 mmol/L (ref 22–32)
Calcium: 9 mg/dL (ref 8.9–10.3)
Chloride: 106 mmol/L (ref 98–111)
Creatinine, Ser: 1.56 mg/dL — ABNORMAL HIGH (ref 0.61–1.24)
GFR calc Af Amer: 49 mL/min — ABNORMAL LOW (ref >60)
GFR calc non Af Amer: 42 mL/min — ABNORMAL LOW (ref >60)
Glucose, Bld: 197 mg/dL — ABNORMAL HIGH (ref 70–99)
Potassium: 4.5 mmol/L (ref 3.5–5.1)
Sodium: 137 mmol/L (ref 135–145)

## 2019-07-13 LAB — GLUCOSE, CAPILLARY
Glucose-Capillary: 102 mg/dL — ABNORMAL HIGH (ref 70–99)
Glucose-Capillary: 127 mg/dL — ABNORMAL HIGH (ref 70–99)
Glucose-Capillary: 136 mg/dL — ABNORMAL HIGH (ref 70–99)
Glucose-Capillary: 154 mg/dL — ABNORMAL HIGH (ref 70–99)
Glucose-Capillary: 183 mg/dL — ABNORMAL HIGH (ref 70–99)
Glucose-Capillary: 190 mg/dL — ABNORMAL HIGH (ref 70–99)
Glucose-Capillary: 95 mg/dL (ref 70–99)

## 2019-07-13 LAB — HEMOGLOBIN AND HEMATOCRIT, BLOOD
HCT: 40.6 % (ref 39.0–52.0)
HCT: 41.7 % (ref 39.0–52.0)
HCT: 36 % — ABNORMAL LOW (ref 39.0–52.0)
HCT: 39.2 % (ref 39.0–52.0)
Hemoglobin: 13.2 g/dL (ref 13.0–17.0)
Hemoglobin: 13.3 g/dL (ref 13.0–17.0)
Hemoglobin: 11.8 g/dL — ABNORMAL LOW (ref 13.0–17.0)
Hemoglobin: 13.1 g/dL (ref 13.0–17.0)

## 2019-07-13 LAB — TYPE AND SCREEN
ABO/RH(D): B POS
Antibody Screen: NEGATIVE

## 2019-07-13 LAB — POCT I-STAT 7, (LYTES, BLD GAS, ICA,H+H)
Acid-base deficit: 3 mmol/L — ABNORMAL HIGH (ref 0.0–2.0)
Bicarbonate: 24.6 mmol/L (ref 20.0–28.0)
Calcium, Ion: 1.2 mmol/L (ref 1.15–1.40)
HCT: 41 % (ref 39.0–52.0)
Hemoglobin: 13.9 g/dL (ref 13.0–17.0)
O2 Saturation: 100 %
Potassium: 4.4 mmol/L (ref 3.5–5.1)
Sodium: 139 mmol/L (ref 135–145)
TCO2: 26 mmol/L (ref 22–32)
pCO2 arterial: 50.5 mmHg — ABNORMAL HIGH (ref 32.0–48.0)
pH, Arterial: 7.295 — ABNORMAL LOW (ref 7.350–7.450)
pO2, Arterial: 216 mmHg — ABNORMAL HIGH (ref 83.0–108.0)

## 2019-07-13 LAB — CBC
HCT: 38.3 % — ABNORMAL LOW (ref 39.0–52.0)
Hemoglobin: 12.7 g/dL — ABNORMAL LOW (ref 13.0–17.0)
MCH: 31.4 pg (ref 26.0–34.0)
MCHC: 33.2 g/dL (ref 30.0–36.0)
MCV: 94.6 fL (ref 80.0–100.0)
Platelets: 220 10*3/uL (ref 150–400)
RBC: 4.05 MIL/uL — ABNORMAL LOW (ref 4.22–5.81)
RDW: 13.7 % (ref 11.5–15.5)
WBC: 15.2 10*3/uL — ABNORMAL HIGH (ref 4.0–10.5)
nRBC: 0 % (ref 0.0–0.2)

## 2019-07-13 LAB — ABO/RH: ABO/RH(D): B POS

## 2019-07-13 MED ORDER — NOREPINEPHRINE 4 MG/250ML-% IV SOLN
2.0000 ug/min | INTRAVENOUS | Status: DC
  Administered 2019-07-13: 22:00:00 10 ug/min via INTRAVENOUS
  Administered 2019-07-13: 2 ug/min via INTRAVENOUS
  Filled 2019-07-13: qty 250

## 2019-07-13 MED ORDER — ROCURONIUM BROMIDE 50 MG/5ML IV SOLN
60.0000 mg | Freq: Once | INTRAVENOUS | Status: AC
  Administered 2019-07-13: 60 mg via INTRAVENOUS

## 2019-07-13 MED ORDER — NOREPINEPHRINE 4 MG/250ML-% IV SOLN
INTRAVENOUS | Status: AC
  Filled 2019-07-13: qty 250

## 2019-07-13 MED ORDER — INSULIN ASPART 100 UNIT/ML ~~LOC~~ SOLN
0.0000 [IU] | SUBCUTANEOUS | Status: DC
Start: 2019-07-13 — End: 2019-07-13

## 2019-07-13 MED ORDER — FENTANYL CITRATE (PF) 100 MCG/2ML IJ SOLN
INTRAMUSCULAR | Status: AC
  Filled 2019-07-13: qty 2

## 2019-07-13 MED ORDER — SODIUM CHLORIDE 0.9 % IV SOLN
1.5000 g | Freq: Four times a day (QID) | INTRAVENOUS | Status: DC
  Administered 2019-07-13 – 2019-07-19 (×27): 1.5 g via INTRAVENOUS
  Filled 2019-07-13 (×5): qty 4
  Filled 2019-07-13: qty 1.5
  Filled 2019-07-13: qty 4
  Filled 2019-07-13 (×2): qty 1.5
  Filled 2019-07-13 (×2): qty 4
  Filled 2019-07-13: qty 1.5
  Filled 2019-07-13: qty 4
  Filled 2019-07-13: qty 1.5
  Filled 2019-07-13 (×2): qty 4
  Filled 2019-07-13 (×3): qty 1.5
  Filled 2019-07-13: qty 4
  Filled 2019-07-13 (×4): qty 1.5
  Filled 2019-07-13: qty 4
  Filled 2019-07-13: qty 1.5
  Filled 2019-07-13: qty 4
  Filled 2019-07-13: qty 1.5
  Filled 2019-07-13: qty 4
  Filled 2019-07-13: qty 1.5

## 2019-07-13 MED ORDER — PHENYLEPHRINE 40 MCG/ML (10ML) SYRINGE FOR IV PUSH (FOR BLOOD PRESSURE SUPPORT)
200.0000 ug | PREFILLED_SYRINGE | Freq: Once | INTRAVENOUS | Status: AC
  Administered 2019-07-13: 200 ug via INTRAVENOUS

## 2019-07-13 MED ORDER — CHLORHEXIDINE GLUCONATE 0.12% ORAL RINSE (MEDLINE KIT)
15.0000 mL | Freq: Two times a day (BID) | OROMUCOSAL | Status: DC
  Administered 2019-07-13 – 2019-07-14 (×2): 15 mL via OROMUCOSAL

## 2019-07-13 MED ORDER — ORAL CARE MOUTH RINSE
15.0000 mL | OROMUCOSAL | Status: DC
  Administered 2019-07-13 – 2019-07-14 (×14): 15 mL via OROMUCOSAL

## 2019-07-13 MED ORDER — NOREPINEPHRINE 4 MG/250ML-% IV SOLN: 0.0000 ug/min | INTRAVENOUS | Status: DC

## 2019-07-13 MED ORDER — MIDAZOLAM HCL 2 MG/2ML IJ SOLN
INTRAMUSCULAR | Status: AC
  Filled 2019-07-13: qty 4

## 2019-07-13 MED ORDER — ETOMIDATE 2 MG/ML IV SOLN
20.0000 mg | Freq: Once | INTRAVENOUS | Status: AC
  Administered 2019-07-13: 20 mg via INTRAVENOUS

## 2019-07-13 MED ORDER — PHENYLEPHRINE 40 MCG/ML (10ML) SYRINGE FOR IV PUSH (FOR BLOOD PRESSURE SUPPORT)
PREFILLED_SYRINGE | INTRAVENOUS | Status: AC
  Filled 2019-07-13: qty 10

## 2019-07-13 MED ORDER — SODIUM CHLORIDE 0.9 % IV SOLN: 250.0000 mL | INTRAVENOUS | Status: DC

## 2019-07-13 MED ORDER — MIDAZOLAM HCL 2 MG/2ML IJ SOLN
1.0000 mg | Freq: Once | INTRAMUSCULAR | Status: AC
  Administered 2019-07-13: 1 mg via INTRAVENOUS

## 2019-07-13 MED ORDER — FENTANYL CITRATE (PF) 100 MCG/2ML IJ SOLN
50.0000 ug | Freq: Once | INTRAMUSCULAR | Status: AC
  Administered 2019-07-13: 50 ug via INTRAVENOUS

## 2019-07-13 MED ORDER — SODIUM CHLORIDE 0.9 % IV BOLUS
1000.0000 mL | Freq: Once | INTRAVENOUS | Status: AC
  Administered 2019-07-13: 1000 mL via INTRAVENOUS

## 2019-07-13 MED ORDER — PANTOPRAZOLE SODIUM 40 MG IV SOLR
40.0000 mg | Freq: Two times a day (BID) | INTRAVENOUS | Status: DC
  Administered 2019-07-13 – 2019-07-19 (×13): 40 mg via INTRAVENOUS
  Filled 2019-07-13 (×13): qty 40

## 2019-07-13 MED ORDER — ONDANSETRON HCL 4 MG/2ML IJ SOLN
4.0000 mg | Freq: Four times a day (QID) | INTRAMUSCULAR | Status: DC | PRN
  Administered 2019-07-13 – 2019-08-23 (×12): 4 mg via INTRAVENOUS
  Filled 2019-07-13 (×13): qty 2

## 2019-07-13 MED ORDER — FENTANYL CITRATE (PF) 100 MCG/2ML IJ SOLN
25.0000 ug | INTRAMUSCULAR | Status: DC | PRN
  Administered 2019-07-13: 25 ug via INTRAVENOUS
  Administered 2019-07-13: 100 ug via INTRAVENOUS
  Administered 2019-07-14: 08:00:00 25 ug via INTRAVENOUS
  Administered 2019-07-14: 50 ug via INTRAVENOUS
  Administered 2019-07-14: 25 ug via INTRAVENOUS
  Administered 2019-07-14 – 2019-07-15 (×2): 50 ug via INTRAVENOUS
  Filled 2019-07-13 (×7): qty 2

## 2019-07-13 MED ORDER — GUAIFENESIN 100 MG/5ML PO SOLN
5.0000 mL | ORAL | Status: DC | PRN
  Administered 2019-07-13: 100 mg via ORAL
  Filled 2019-07-13: qty 25

## 2019-07-13 MED ORDER — LACTATED RINGERS IV SOLN: INTRAVENOUS | Status: DC

## 2019-07-13 MED ORDER — DEXMEDETOMIDINE HCL IN NACL 400 MCG/100ML IV SOLN
0.0000 ug/kg/h | INTRAVENOUS | Status: AC
  Administered 2019-07-13: 0.4 ug/kg/h via INTRAVENOUS
  Filled 2019-07-13 (×2): qty 100

## 2019-07-13 NOTE — Progress Notes (Signed)
Pt alert to voice, actively vomiting x5 OP 393mL brown secretions noted, congestion and WOB noted. BP136/72 (86) P82 R20 O2 88-90% on 5L [place], non re-breather applied, O2 @94 -100%, tolerates well. RRT and RT at bedside.

## 2019-07-13 NOTE — Progress Notes (Signed)
Patient alert and oriented, had upper airway congestion with increase coughing early hours of this morning. Doctor on call notified, order given for stat chest x-ray. At about 0700hrs, patient vomited coffee ground liquid, tube feed turned off and doctor notified of current issue. AM nurse notified of incident

## 2019-07-13 NOTE — Plan of Care (Signed)
CXR - bibasilar atelectasis and persistent low lung volumes.  CBC with leukocytosis. Can not r/o aspiration pneumonia. Will start Unasyn. Stroke team will follow and decide to duration based on clinical course. Robitussin for symptomatic tx.  -- Amie Portland MD

## 2019-07-13 NOTE — Procedures (Signed)
Bronchoscopy  Indication: Aspiration  Consent: emergent  Anesthesia: in place for intubation (etomidate, fentanyl, versed, rocuronium)  Procedure - Timeout performed - Bronchoscope advanced through ETT - Airways examined down to subsegmental level - Following airway examination, BAL in RLL  Findings - Moderate thick secretions, worse on R - Purulent return on BAL in RLL  Specimen(s): RLL BAL  Complications: None immediate

## 2019-07-13 NOTE — Progress Notes (Signed)
Dr. Tamala Julian (CCM) said that it was OK to give the pt's scheduled ASA and Brilinta.

## 2019-07-13 NOTE — Plan of Care (Signed)
RN Called - pt coughing a lot.  Order CXR and decide on Abx if concerning for pneumonia.  Robitussin ordered  -- Amie Portland, MD [facility] Pager: [phone] If 7pm to 7am, please call on call as listed on [facility].

## 2019-07-13 NOTE — Progress Notes (Signed)
This RN made aware of patient having vomiting episodes that looked like gastric secretions. RN noticed on assessment that patient had upper airway congestion and mild increased work of breathing as well as loose BM and current vomiting episode. Patient alert and oriented x3 and able to follow commands. Rapid Response made aware of situation. Stroke team made aware.

## 2019-07-13 NOTE — Procedures (Signed)
Intubation Procedure Note Christerpher Clos [id number] August 15, 1940  Procedure: Intubation Indications: Respiratory insufficiency  Procedure Details Consent: Unable to obtain consent because of emergent medical necessity. Time Out: Verified patient identification, verified procedure, site/side was marked, verified correct patient position, special equipment/implants available, medications/allergies/relevent history reviewed, required imaging and test results available.  Performed  Maximum sterile technique was used including antiseptics, cap, gloves and gown.  MAC and 4    Evaluation Hemodynamic Status: BP stable throughout; O2 sats: stable throughout Patient's Current Condition: stable Complications: No apparent complications Patient did tolerate procedure well. Chest X-ray ordered to verify placement.  CXR: pending.   Candee Furbish 07/13/2019

## 2019-07-13 NOTE — Significant Event (Addendum)
Rapid Response Event Note  Overview: Respiratory Distress - Aspiration   Initial Focused Assessment: Called by nurse with concerns of patient coughing and vomiting. Per nurse, patient has vomited at least 3-4 times - brown gastric contents. Per nurse to able to follow commands but is lethargic. Blood sugar was normal and TF were stopped in the early morning hours.   While I was walking up the hallway on 3W - I could here the patient's chest rattling, I immediately walked in and tried to NTS the patient - patient did not gag initially and I was not able to suction out any secretions. Stroke Team NP was paged, I updated her that the patient was on [facility] and not in acute distress however was labored and vomited. I told her that I would try to pulmonary toilet the patient but that I was worried that ultimately the patient would need to be intubated. I tried to NTS the patient a second time and again I was not able to suction any secretions so I tried to suction his mouth, patient did gag, and profusely vomited about 300 cc of brown gastric contents. After this, oxygen saturations dropped into the mid 80s and patient's RR increased along with increased WOB. RT placed the patient on NRB 15L 100% - after several minutes - oxygen saturations improved to 95%. Lung sounds - rhonchi throughout. Abdomen - hernia present, mild distension perhaps but soft. Patient was able to follow commands in all extremities, RUE - 5/5, RLE 4/5, LUE 3/5 and LLE 2/5. + dysarthria. Stroke Team updated and PCCM team was consulted. PCCM team arrived to bedside, MD attempted NTS as well. Decision made to transfer to 4N ICU for intubation.   Interventions: -- NTS x 2 -- Suctioning  -- PIV - 20G inserted in LT WR - GBR  Plan of Care: -- Urgent transfer to 4N - ICU per PCCM for intubation and bronchoscopy   Event Summary:  Call Time 0823 Arrival Time 0825  End Time [place], [place]

## 2019-07-13 NOTE — Progress Notes (Signed)
RT NOTE: ETT advanced 2cm per CCM to 26 at the lips per chest xray. Vitals are stable. RT will continue to monitor.

## 2019-07-13 NOTE — Progress Notes (Signed)
STROKE TEAM PROGRESS NOTE   INTERVAL HISTORY Coffee ground Vomiting over night and again this am. RRT at bedside for pulmonary toilet with some initial improvement, then vomited again requiring NRB. CXR relatively ok. Hgb stable this am. CCM consulted -> transferred to neuro ICU and intubated.  Patient examined just prior to intubation.  Is drowsy but easily arouses.  Follows commands.  Has dysarthric speech and left hemiparesis which apparently not significantly changed from his baseline.  He is in respiratory distress and requiring emergent intubation.   Vitals:   07/12/19 2059 07/12/19 2300 07/13/19 0109 07/13/19 0409  BP: (!) 189/110 118/65 125/82 (!) 145/67  Pulse: (!) 128 97    Resp:  18  20  Temp: (!) 97.4 F (36.3 C) 99.6 F (37.6 C) 98.8 F (37.1 C) 98.5 F (36.9 C)  TempSrc: Axillary Axillary Axillary Axillary  SpO2: 96% 94% 95% 96%  Weight:      Height:        CBC:  Recent Labs  Lab 07/09/19 2031 07/09/19 2054 07/10/19 0259 07/11/19 0540 07/12/19 0659 07/12/19 0659 07/13/19 0432 07/13/19 0730  WBC 8.1   < > 11.2*   < > 11.5*  --  15.2*  --   NEUTROABS 5.4  --  9.4*  --   --   --   --   --   HGB 15.0   < > 12.3*   < > 12.2*   < > 12.7* 13.2  HCT 45.8   < > 38.0*   < > 37.0*   < > 38.3* 40.6  MCV 95.8   < > 96.9   < > 95.9  --  94.6  --   PLT 264   < > 163   < > 209  --  220  --    < > = values in this interval not displayed.    Basic Metabolic Panel:  Recent Labs  Lab 07/11/19 0540 07/11/19 0540 07/11/19 1603 07/12/19 0659 07/13/19 0432  NA 142   < >  --  140 137  K 3.5   < >  --  4.4 4.5  CL 113*   < >  --  110 106  CO2 19*   < >  --  21* 22  GLUCOSE 136*   < >  --  151* 197*  BUN 20   < >  --  25* 33*  CREATININE 1.45*   < >  --  1.33* 1.56*  CALCIUM 8.4*   < >  --  8.8* 9.0  MG 1.8  --  1.8  --   --   PHOS 2.1*  --  2.4*  --   --    < > = values in this interval not displayed.    IMAGING past 24 hours DG Chest 2 View  Result Date:  07/13/2019 CLINICAL DATA:  Congestion.  Stroke. EXAM: CHEST - 2 VIEW COMPARISON:  Chest x-ray dated Jul 10, 2019. FINDINGS: Interval extubation. New feeding tube entering the stomach with the tip below the field of view. Unchanged mild cardiomegaly. Atherosclerotic calcification of the aortic arch. Normal pulmonary vascularity. Persistent low lung volumes with unchanged mild elevation of the right hemidiaphragm. Mild bibasilar atelectasis. No pneumothorax or pleural effusion. No acute osseous abnormality. IMPRESSION: Persistent low lung volumes and bibasilar atelectasis. Electronically Signed   By: Titus Dubin M.D.   On: 07/13/2019 05:26   DG Chest Port 1 View  Result Date: 07/13/2019 CLINICAL DATA:  Endotracheal tube and OG tube placement. EXAM: PORTABLE CHEST 1 VIEW COMPARISON:  One-view chest x-ray 07/13/2019 at 4:47 a.m. FINDINGS: The heart is enlarged. Atherosclerotic calcifications are present at the aortic arch. The patient is intubated. Endotracheal tube terminates 8 cm above the carina, at the level of the clavicles. A second enteric tube has been placed and courses off the inferior border of the film. Bibasilar airspace opacities are noted. Upper lung fields are clear. IMPRESSION: 1. Endotracheal tube terminates 8 cm above the carina, at the level of the clavicles. 2. Interval placement of second enteric tube. 3. Bibasilar airspace disease likely reflects atelectasis. 4. Cardiomegaly without failure. Electronically Signed   By: San Morelle M.D.   On: 07/13/2019 11:14   DG Abd Portable 1V  Result Date: 07/13/2019 CLINICAL DATA:  OG tube placement EXAM: PORTABLE ABDOMEN - 1 VIEW COMPARISON:  07/10/2019 FINDINGS: NG tube is in place with the tip in the distal stomach. Feeding tube is in place with the tip in the mid stomach. Gaseous distention of small bowel loops concerning for small bowel obstruction. No free air or organomegaly. IMPRESSION: NG tube and feeding tube in the stomach as  above. Worsening small bowel gaseous dilatation concerning for small bowel obstruction. Electronically Signed   By: Rolm Baptise M.D.   On: 07/13/2019 11:13     PHYSICAL EXAM    General -frail elderly malnourished looking Caucasian male in respiratory distress.  Ophthalmologic - fundi not visualized due to noncooperation.  Cardiovascular - Regular rhythm and rate, but frequent PVCs on tele.  Neuro -drowsy but easily arouses able and follows commands well.  Moderate dysarthria but can be easily understood.. Visual field full, no hemianopia. Gaze bilaterally without difficulty, PERRL. Tracking bilaterally. Left facial droop. Tongue midline. RUE 4/5 but LUE 3-/5 drift to bed within 10 second, finger grip 3/5. RLE 3/5 and LLE 2/5 proximal and 2/5 left foot DF/PF. DTR 1+ and no babinski. Sensation symmetrical. R FTN intact but slow, L FTN not able to test due to weakness. Gait not tested.    ASSESSMENT/PLAN Stephen Copeland is a 79 y.o. male with history of Afib, systolic CHF, HLD, CAD, HTN, prostate cancer presenting with left sided weakness, facial droop, dysarthria.   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 embolic secondary to known AF even on Eliquis  Code Stroke CT head evolving R MCA territory infarct (insula, cerebral cortex). hyperdense R M2 +/- M3. ASPECTS 7    CTA head & neck +LVO. Probable ruptured plaque R ICA bifurcation w/ near occlusive stenosis, possible superimposed dissection. Distal reconstitution R M1 from collaterals but w/ distal embolic occlusion proximal R M2/3 superior division branch. L ICA bifurcation and siphon atherosclerosis. B VA origins 50% stenoses. R subclavian artery origin 60% stenosis.   CT perfusion 19cc core infarct R insula and posterior frontal. 33cc penumbra.   Cerebral angio TICI3 revascularization occluded middle trifurcation  R MCA branch, revascularization R ICA occlusion w/ angioplasty and stenting.   Post IR CT contrast stain R putamen and moderate R sylvian fissure, SAH + contrast.    CT head R sylvian fissure SAH. Increased contrast vs hemorrhage R suprasellar cistern following ICA concerned for increased SAH. R sulci effacement.   Carotid Doppler  R ICA stent patent  MRI  Patchy R MCA infarcts. SAH R sylvian fissure. Small IVH.   MRA -  Severely motion degraded head MRA. Persistent patency of the revascularized right ICA. No flow limiting proximal stenosis identified.   2D Echo EF 35-40%. No source of embolus   LDL 90  HgbA1c 5.8  SCDs for VTE prophylaxis  Eliquis (apixaban) daily prior to admission, now on aspirin 81 mg daily and Brilinta (ticagrelor) 90 mg bid. Received during IR. Continue DAPT administration in setting of SAH given new ICA stent. Continue DAPT in setting of coffee ground emesis.   Therapy recommendations:  CIR recommended  Disposition:  pending   Dr. 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 Hold AC given post IR SAH and need for DAPT post stent placement. Possibly may due apixaban and a single antiplatelet stent and afib. . Consider AC once SAH resolves.    Hypertension  Home meds:  Hydralazine 50 tid, metoprolol 25 bid  Resumed metoprolol 12.5 bid and Hydralazine 25 mg Q8 hrs  . Long-term BP goal normotensive  Hyperlipidemia  Home meds:  No  statin  LDL 90, goal < 70  May consider statin again ->pt adamantly has refused statins in the past  Cardiomyopathy  EF 30 to 40% since 2017  This admission EF 35 to 40%  CXR no pulmonary edema  Dysphagia . Secondary to stroke . NPO . Did not pass swallow eval . Speech on board . Cortrak placement  . Tube feeding on hold given intubation, vomiting coffee ground emesis (continue DAPT as above)   Other Stroke Risk Factors  Advanced age  Coronary artery disease  Chronic systolic Congestive heart failure  Other Active Problems  GERD on PPI   BPH on flomax PTA  Prostate cancer   Hypokalemia K 4.5  AKI Cre 1.33->1.56  LBBB. Appears old. EKG neg ischemia. Trops 27->32  (Likely strain related)  Mild leukocytosis, WBC >15.2 (T max 99.8)  Mild acute blood loss anemia Hb 12.7  Hypophosphatemia - 3.2->2.1   Hospital Day #4   Patient has had respiratory distress likely due to aspiration following multiple bouts of vomiting this a.m.  He requires emergent intubation and ventilatory support for his respiratory failure and treatment for aspiration pneumonia.  I have consulted Dr. Tamala Julian from critical care medicine for the same.  Continue ventilatory support by him.  Continue aspirin and Brilinta if hematocrit is stable.For secondary stroke prevention.  I called the patient's wife at her listed cell phone number as well as home number but was unable to contact her or leave a message.  Discussed with Dr. Tamala Julian. This patient is critically ill and at significant risk of neurological worsening, death and care requires constant monitoring of vital signs, hemodynamics,respiratory and cardiac monitoring, extensive review of multiple databases, frequent neurological assessment, discussion with family, other specialists and medical decision making of high complexity.I have made any additions or clarifications directly to the above note.This critical care time does not reflect procedure  time, or teaching time or supervisory time of PA/NP/Med Resident etc but could involve  care discussion time.  I spent 30 minutes of neurocritical care time  in the care of  this patient.    Antony Contras, MD To contact Stroke Continuity provider, please refer to [email or web address] After hours, contact General Neurology

## 2019-07-13 NOTE — Progress Notes (Signed)
NAME:  Stephen Copeland, MRN:  [id number], DOB:  November 28, 1940, LOS: 4 ADMISSION DATE:  07/09/2019, CONSULTATION DATE:  5/6 REFERRING MD:  Dr Rory Percy, CHIEF COMPLAINT:  CVA  Brief History   79 y/o M admitted 5/6 with right sided CVA and remained intubated post thrombectomy and carotid stent. 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 Estanislado Pandy note. Rt ICA stend, thrombectomy Rt MCA  Significant Diagnostic Tests:   CT head 5/6 > Evolving acute ischemic right MCA territory infarct involving the right insula and overlying supra ganglionic right cerebral cortex. No associated hemorrhage.  CTA head/neck 5/6 > Probable ruptured plaque at the right carotid bifurcation with associated severe near occlusive stenosis. Markedly attenuated and diminutive flow distally to the right ICA terminus. Possible superimposed dissection may be present. Distal reconstitution of the right M1 segment via collateral flow across the circle-of-Willis but with distal embolic occlusion of a proximal right M2/M3 branch  ECHO 5/7 > LVEF 35-40%, LV with global hypokinesis, RV systolic function normal  Micro Data:  Flu/Covid PCR 5/6 >> neg  Antimicrobials:  Unasyn 5/10 >>   Interim history/subjective:  RRT called PCCM regarding concern for respiratory decline this am after n/v episodes.    Objective   Blood pressure 126/71, pulse 88, temperature 98.5 F (36.9 C), temperature source Axillary, resp. rate 19, height 6' (1.829 m), weight 86.9 kg, SpO2 100 %.        Intake/Output Summary (Last 24 hours) at 07/13/2019 1011 Last data filed  at 07/13/2019 1000 Gross per 24 hour  Intake 1801 ml  Output 575 ml  Net 1226 ml   Filed Weights   07/09/19 2035 07/10/19 0115 07/12/19 0700  Weight: 85 kg 84.3 kg 86.9 kg    Examination: General: elderly male sitting up in bed  HEENT: MM pink/moist, panda in place, moist voice quality Neuro: awake, alert, attempts to interact, left sided weakness, no gag with suction CV: s1s2 rrr, no m/r/g PULM:  Non-labored but accessory muscle use, gurgling voice quality, rhonchi bilaterally  GI: soft, bsx4 active  Extremities: warm/dry, no edema  Skin: no rashes or lesions  Resolved Hospital Problem list     Assessment & Plan:   Ischemic stroke R ICA / MCA. S/p Rt ICA stenting and Rt MCA thrombectomy 5/7 complicated by [facility].  SAH post procedure Left Carotid Stent -care per Neurology  -continue ASA, Brilinta -defer to Neurology about resuming home anticoagulation  -follow H/H closely, benefits outweigh risks  Acute Hypoxemic Respiratory Failure  Suspected Aspiration in setting of CVA -transfer to ICU now for intubation -continue unasyn for empiric aspiration coverage  -PCXR post intubation  -PRVC 8cc/kg  -wean PEEP / fiO2 for sats >90% -daily SBT / WUA  -will need ongoing SLP efforts post extubation  -assess tracheal aspirate  -PAD protocol with precedex and fentanyl   Atrial Fibrillation with RVR Hx of AF on eliquis. -ICU monitoring  -hold eliquis  -defer timing of anticoagulation to Neurology   Chronic HFrEF Dilated Cardiomyopathy LBBB  LVEF 35-40%, essentially unchanged  -continue low dose  metoprolol for now  Best practice:  Diet: NPO Pain/Anxiety/Delirium protocol (if indicated): as above VAP protocol (if indicated): yes DVT prophylaxis: SCD GI prophylaxis: Protonix Glucose control: SSI Mobility: BR Code Status: FULL Family Communication: Per neurology Disposition: ICU  CC Time: 67 minutes   Noe Gens, MSN, NP-C [facility] 07/13/2019, 10:29 AM   Please see [email or web address] for pager details.

## 2019-07-13 NOTE — Progress Notes (Signed)
PT Cancellation Note  Patient Details Name: Stephen Copeland MRN: [id number] DOB: 1940-07-01   Cancelled Treatment:    Reason Eval/Treat Not Completed: Patient not medically ready.  Pt is newly re-intubated. 07/13/2019  Stephen Carne., PT Acute Rehabilitation Services [phone]  (pager) [phone]  (office)   Stephen Copeland 07/13/2019, 5:30 PM

## 2019-07-14 ENCOUNTER — Other Ambulatory Visit (HOSPITAL_COMMUNITY): Payer: Medicare Other

## 2019-07-14 ENCOUNTER — Inpatient Hospital Stay (HOSPITAL_COMMUNITY): Payer: Medicare Other

## 2019-07-14 ENCOUNTER — Other Ambulatory Visit: Payer: Self-pay

## 2019-07-14 DIAGNOSIS — Z978 Presence of other specified devices: Secondary | ICD-10-CM

## 2019-07-14 LAB — BASIC METABOLIC PANEL
Anion gap: 10 (ref 5–15)
BUN: 55 mg/dL — ABNORMAL HIGH (ref 8–23)
CO2: 20 mmol/L — ABNORMAL LOW (ref 22–32)
Calcium: 8.3 mg/dL — ABNORMAL LOW (ref 8.9–10.3)
Chloride: 110 mmol/L (ref 98–111)
Creatinine, Ser: 2.06 mg/dL — ABNORMAL HIGH (ref 0.61–1.24)
GFR calc Af Amer: 35 mL/min — ABNORMAL LOW (ref >60)
GFR calc non Af Amer: 30 mL/min — ABNORMAL LOW (ref >60)
Glucose, Bld: 135 mg/dL — ABNORMAL HIGH (ref 70–99)
Potassium: 4.8 mmol/L (ref 3.5–5.1)
Sodium: 140 mmol/L (ref 135–145)

## 2019-07-14 LAB — CBC
HCT: 35.7 % — ABNORMAL LOW (ref 39.0–52.0)
Hemoglobin: 11.9 g/dL — ABNORMAL LOW (ref 13.0–17.0)
MCH: 31.6 pg (ref 26.0–34.0)
MCHC: 33.3 g/dL (ref 30.0–36.0)
MCV: 94.7 fL (ref 80.0–100.0)
Platelets: 215 10*3/uL (ref 150–400)
RBC: 3.77 MIL/uL — ABNORMAL LOW (ref 4.22–5.81)
RDW: 14 % (ref 11.5–15.5)
WBC: 10.5 10*3/uL (ref 4.0–10.5)
nRBC: 0 % (ref 0.0–0.2)

## 2019-07-14 LAB — HEMOGLOBIN AND HEMATOCRIT, BLOOD
HCT: 33.3 % — ABNORMAL LOW (ref 39.0–52.0)
HCT: 31.5 % — ABNORMAL LOW (ref 39.0–52.0)
HCT: 30.8 % — ABNORMAL LOW (ref 39.0–52.0)
Hemoglobin: 11 g/dL — ABNORMAL LOW (ref 13.0–17.0)
Hemoglobin: 10.5 g/dL — ABNORMAL LOW (ref 13.0–17.0)
Hemoglobin: 10.2 g/dL — ABNORMAL LOW (ref 13.0–17.0)

## 2019-07-14 LAB — GLUCOSE, CAPILLARY
Glucose-Capillary: 119 mg/dL — ABNORMAL HIGH (ref 70–99)
Glucose-Capillary: 122 mg/dL — ABNORMAL HIGH (ref 70–99)
Glucose-Capillary: 140 mg/dL — ABNORMAL HIGH (ref 70–99)
Glucose-Capillary: 89 mg/dL (ref 70–99)
Glucose-Capillary: 91 mg/dL (ref 70–99)
Glucose-Capillary: 95 mg/dL (ref 70–99)

## 2019-07-14 LAB — PROTEIN / CREATININE RATIO, URINE
Creatinine, Urine: 110.28 mg/dL
Protein Creatinine Ratio: 0.7 mg/mg{Cre} — ABNORMAL HIGH (ref 0.00–0.15)
Total Protein, Urine: 77 mg/dL

## 2019-07-14 LAB — SODIUM, URINE, RANDOM: Sodium, Ur: 20 mmol/L

## 2019-07-14 MED ORDER — CHLORHEXIDINE GLUCONATE 0.12 % MT SOLN
15.0000 mL | Freq: Two times a day (BID) | OROMUCOSAL | Status: DC
  Administered 2019-07-14 – 2019-08-26 (×78): 15 mL via OROMUCOSAL
  Filled 2019-07-14 (×79): qty 15

## 2019-07-14 MED ORDER — POLYETHYLENE GLYCOL 3350 17 G PO PACK
17.0000 g | PACK | Freq: Every day | ORAL | Status: DC
  Administered 2019-07-14 – 2019-07-18 (×4): 17 g
  Filled 2019-07-14 (×4): qty 1

## 2019-07-14 MED ORDER — OXYCODONE HCL 5 MG/5ML PO SOLN
5.0000 mg | ORAL | Status: DC | PRN
  Administered 2019-07-14 – 2019-08-25 (×27): 5 mg
  Filled 2019-07-14 (×29): qty 5

## 2019-07-14 MED ORDER — ORAL CARE MOUTH RINSE
15.0000 mL | Freq: Two times a day (BID) | OROMUCOSAL | Status: DC
  Administered 2019-07-15 – 2019-08-26 (×78): 15 mL via OROMUCOSAL

## 2019-07-14 MED ORDER — DOCUSATE SODIUM 50 MG/5ML PO LIQD: 50.0000 mg | Freq: Every day | ORAL | Status: DC

## 2019-07-14 MED ORDER — DOCUSATE SODIUM 50 MG/5ML PO LIQD
50.0000 mg | Freq: Every day | ORAL | Status: DC
  Administered 2019-07-14 – 2019-07-22 (×6): 50 mg
  Filled 2019-07-14 (×9): qty 10

## 2019-07-14 MED ORDER — GUAIFENESIN 100 MG/5ML PO SOLN
5.0000 mL | ORAL | Status: DC | PRN
  Administered 2019-07-23: 100 mg
  Filled 2019-07-14: qty 5

## 2019-07-14 MED ORDER — CHLORHEXIDINE GLUCONATE CLOTH 2 % EX PADS
6.0000 | MEDICATED_PAD | Freq: Every day | CUTANEOUS | Status: DC
  Administered 2019-07-15 – 2019-08-05 (×18): 6 via TOPICAL

## 2019-07-14 MED ORDER — SENNOSIDES-DOCUSATE SODIUM 8.6-50 MG PO TABS: 1.0000 | ORAL_TABLET | Freq: Every evening | ORAL | Status: DC | PRN

## 2019-07-14 NOTE — Progress Notes (Addendum)
NAME:  Stephen Copeland, MRN:  [id number], DOB:  [birth year], LOS: 5 ADMISSION DATE:  07/09/2019, CONSULTATION DATE:  5/6 REFERRING MD:  Dr Rory Percy, CHIEF COMPLAINT:  CVA  Brief History   79 y/o M admitted 5/6 with right sided CVA and remained intubated post thrombectomy and carotid stent. 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 Estanislado Pandy note. Rt ICA stend, thrombectomy Rt MCA Bronch 5/11 Significant Diagnostic Tests:   CT head 5/6 > Evolving acute ischemic right MCA territory infarct involving the right insula and overlying supra ganglionic right cerebral cortex. No associated hemorrhage.  CTA head/neck 5/6 > Probable ruptured plaque at the right carotid bifurcation with associated severe near occlusive stenosis. Markedly attenuated and diminutive flow distally to the right ICA terminus. Possible superimposed dissection may be present. Distal reconstitution of the right M1 segment via collateral flow across the circle-of-Willis but with distal embolic occlusion of a proximal right M2/M3 branch  ECHO 5/7 > LVEF 35-40%, LV with global hypokinesis, RV systolic function normal 5/7 cth: 1. Subarachnoid hemorrhage within the right Sylvian fissure is again noted. 2. Additional contrast or hemorrhage along the right side of the suprasellar cistern following the course of the right internal carotid artery has increased. This is concerning for additional subarachnoid hemorrhage. 3. Some effacement of sulci over the right hemisphere without significant mass effect on the right lateral ventricle. 4. Stable  diffuse white matter disease. MRI/MRI brain 5/7: 1. Patchy acute right MCA infarcts. 2. Subarachnoid hemorrhage greatest in the right sylvian fissure as seen on earlier CT. 3. Small volume intraventricular hemorrhage. 4. Severely motion degraded head MRA. Persistent patency of the revascularized right ICA. No flow limiting proximal stenosis identified. 5/7 u/s carotid: Right ICA stent appears to be widely patent with no  evidence of obstruction.  Micro Data:  Flu/Covid PCR 5/6 >> neg 5/7 mrsa pcr: positive 5/10 BAL:  gp rod  Antimicrobials:  Unasyn 5/10 >>   Interim history/subjective:  5/11: no acute events overnight. Remains on norepi at this time.  5/10:RRT called PCCM regarding concern for respiratory decline this am after n/v episodes.    Objective   Blood pressure (!) 97/59, pulse (!) 45, temperature 98.7 F (37.1 C), temperature source Axillary, resp. rate (!) 22, height 6' (1.829 m), weight 86.2 kg, SpO2 100 %.    Vent Mode: PRVC FiO2 (%):  [40 %-100 %] 40 % Set Rate:  [15 bmp-20 bmp] 20 bmp Vt Set:  [620 mL] 620 mL PEEP:  [5 cmH20] 5 cmH20 Pressure Support:  [5 cmH20] 5 cmH20 Plateau Pressure:  [15 cmH20-16 cmH20] 15 cmH20   Intake/Output Summary (Last 24 hours) at 07/14/2019 0725 Last data filed at 07/14/2019 0700 Gross per 24 hour  Intake 2913.18 ml  Output 2525 ml  Net 388.18 ml   Filed Weights   07/10/19 0115 07/12/19 0700 07/14/19 0500  Weight: 84.3 kg 86.9 kg 86.2 kg    Examination: General: elderly male reclining comfortably in bed attempting to communicate HEENT: MM pink/moist, ncat, perrl Neuro: awake, alert, attempts to interact,  CV: s1s2 irreg and brady no m/r/g PULM:  Clear bilaterally GI: soft, bsx4 active  Extremities: warm/dry, no edema  Skin: no rashes or lesions  Resolved Hospital Problem list     Assessment & Plan:   Ischemic stroke R ICA / MCA. S/p Rt ICA stenting and Rt MCA thrombectomy 5/7 complicated by [facility].  SAH post  procedure Left Carotid Stent -care per Neurology  -continue ASA, Brilinta -defer to Neurology about resuming home anticoagulation  -follow H/H closely, benefits outweigh risks  Acute Hypoxemic Respiratory Failure  Suspected Aspiration in setting of CVA -sat/sbt -continue unasyn for empiric aspiration coverage  -PRVC 8cc/kg  -wean PEEP / fiO2 for sats >90% -daily SBT / WUA  -will need ongoing SLP efforts post extubation  -assess tracheal aspirate  -PAD protocol with precedex and fentanyl   N/v with imaging suspected sbo:  -repeat kub -cont og to ilws -start bowel regimen with daily docusate as pt is having bm.s -if kub not improved will need ct abd/pelvis to eval for transition point or full obstruction.   Atrial Fibrillation with RVR -Hx of AF on eliquis. -ICU monitoring  -hold eliquis  -defer timing of anticoagulation to Neurology  Bradycardia:  -follow -on 0.1 precedex -consider cards eval if persists off sedation  Chronic HFrEF Dilated Cardiomyopathy LBBB  Prolonged qt LVEF 35-40%, essentially unchanged  -continue low dose metoprolol for now  Shock, multifactorial  -sedation/cardiogenic and septic -will titrate vasopressor to map >65   Arf:  -renal u/s pending -urine studies -monitor indices -currently rising.  -baseline does appear to be around 1.8  Best practice:  Diet: NPO Pain/Anxiety/Delirium protocol (if indicated): as above VAP protocol (if indicated): yes DVT prophylaxis: SCD GI prophylaxis: Protonix Glucose control: SSI Mobility: BR Code Status: FULL Family Communication: Per neurology Disposition: ICU  Critical care time: The patient is critically ill with multiple organ systems failure and requires high complexity decision making for assessment and support, frequent evaluation and titration of therapies, application of advanced monitoring technologies and extensive interpretation of multiple databases.  Critical care time 38 mins. This  represents my time independent of the NPs time taking care of the pt. This is excluding procedures.    [facility] 07/14/2019, 7:25 AM

## 2019-07-14 NOTE — Progress Notes (Signed)
STROKE TEAM PROGRESS NOTE   INTERVAL HISTORY Patient remains intubated for respiratory failure due to aspiration pneumonia.  He is lightly sedated with Precedex but remains hypotensive requiring pressor support to maintain his MAP greater than 65.  He is arousable and follows commands and moves all 4 extremities purposefully.  No family available at the bedside.  Vital signs stable. CT scan of the head from today shows low-density in the right insular region compatible with evolving infarct.  There is small amount of residual subarachnoid hemorrhage in the right frontal convexity as well as intraventricularly. Vitals:   07/14/19 0700 07/14/19 0715 07/14/19 0800 07/14/19 0811  BP: (!) 106/55 (!) 97/59 (!) 95/54 (!) 95/54  Pulse: (!) 51 (!) 45 (!) 52 (!) 54  Resp: 20 (!) 22 20 20   Temp:   98.4 F (36.9 C)   TempSrc:   Axillary   SpO2: 99% 100% 98% 99%  Weight:      Height:        CBC:  Recent Labs  Lab 07/09/19 2031 07/09/19 2054 07/10/19 0259 07/11/19 0540 07/13/19 0432 07/13/19 0730 07/13/19 2209 07/14/19 0442  WBC 8.1   < > 11.2*   < > 15.2*  --   --  10.5  NEUTROABS 5.4  --  9.4*  --   --   --   --   --   HGB 15.0   < > 12.3*   < > 12.7*   < > 13.1 11.9*  HCT 45.8   < > 38.0*   < > 38.3*   < > 39.2 35.7*  MCV 95.8   < > 96.9   < > 94.6  --   --  94.7  PLT 264   < > 163   < > 220  --   --  215   < > = values in this interval not displayed.    Basic Metabolic Panel:  Recent Labs  Lab 07/11/19 0540 07/11/19 1603 07/12/19 0659 07/13/19 0432 07/13/19 0432 07/13/19 1153 07/14/19 0442  NA 142  --    < > 137   < > 139 140  K 3.5  --    < > 4.5   < > 4.4 4.8  CL 113*  --    < > 106  --   --  110  CO2 19*  --    < > 22  --   --  20*  GLUCOSE 136*  --    < > 197*  --   --  135*  BUN 20  --    < > 33*  --   --  55*  CREATININE 1.45*  --    < > 1.56*  --   --  2.06*  CALCIUM 8.4*  --    < > 9.0  --   --  8.3*  MG 1.8 1.8  --   --   --   --   --   PHOS 2.1* 2.4*  --   --    --   --   --    < > = values in this interval not displayed.    IMAGING past 24 hours CT Head Wo Contrast  Result Date: 07/14/2019 CLINICAL DATA:  Follow-up right stroke EXAM: CT HEAD WITHOUT CONTRAST TECHNIQUE: Contiguous axial images were obtained from the base of the skull through the vertex without intravenous contrast. COMPARISON:  MRI 07/10/2019.  CT 07/10/2019. FINDINGS: Brain: Low density is now present  affecting a 3 cm region of brain in the right insula consistent with the largest area of acute infarction demonstrated by the recent MRI. Other smaller areas of acute infarction in the right MCA territory are not specifically visible by CT. Chronic small-vessel ischemic changes are noted within both basal ganglia regions and within the cerebral hemispheric deep white matter. Small amount of residual hyperdense subarachnoid blood evident in the sylvian fissure and a few of the frontoparietal sulci. Small amount blood dependent in the occipital horn of the right lateral ventricle. No new area of infarction is seen. No hydrocephalus. No extra-axial collection. Vascular: There is atherosclerotic calcification of the major vessels at the base of the brain. Skull: Negative Sinuses/Orbits: Sinuses are clear except for small amount of fluid layering in the left maxillary sinus. Orbits appear normal. Other: None IMPRESSION: 3 cm region of low-density now evident in the right insula consistent with the largest region of acute infarction demonstrated on the previous MRI. No evidence of hemorrhagic transformation or mass effect. Small amount of residual subarachnoid blood. Small amount of intraventricular blood. No hydrocephalus. Electronically Signed   By: Nelson Chimes M.D.   On: 07/14/2019 11:32   DG CHEST PORT 1 VIEW  Result Date: 07/14/2019 CLINICAL DATA:  Acute respiratory failure with hypoxia. EXAM: PORTABLE CHEST 1 VIEW COMPARISON:  Jul 13, 2019. FINDINGS: Stable cardiomegaly. Bibasilar subsegmental  atelectasis is noted. Endotracheal, nasogastric and feeding tubes are in good position. No pneumothorax is noted. Bony thorax is unremarkable. IMPRESSION: Stable bibasilar subsegmental atelectasis. Stable support apparatus. Electronically Signed   By: Marijo Conception M.D.   On: 07/14/2019 08:29   DG Abd Portable 1V  Result Date: 07/14/2019 CLINICAL DATA:  Small bowel obstruction. EXAM: PORTABLE ABDOMEN - 1 VIEW COMPARISON:  Jul 13, 2019. FINDINGS: The bowel gas pattern is normal. Distal tip of feeding and nasogastric tubes are seen in the expected position of the distal stomach. No radio-opaque calculi or other significant radiographic abnormality are seen. IMPRESSION: No evidence of bowel obstruction or ileus. Distal tip of feeding and nasogastric tubes are seen in expected position of the distal stomach. Electronically Signed   By: Marijo Conception M.D.   On: 07/14/2019 08:30     PHYSICAL EXAM     General -frail elderly malnourished looking Caucasian male who is intubated and lightly sedated  Ophthalmologic - fundi not visualized due to noncooperation.  Cardiovascular - Regular rhythm and rate,   Neurological Exam : Patient is intubated and lightly sedated.-drowsy but easily arouses able and follows commands well.  Speech cannot be tested. Visual field full, no hemianopia. Gaze bilaterally without difficulty, PERRL. Tracking bilaterally. Left facial droop. Tongue midline. RUE 4/5 but LUE 3-/5 drift to bed within 10 second, finger grip 3/5. RLE 3/5 and LLE 2/5 proximal and 2/5 left foot DF/PF. DTR 1+ and no babinski. Sensation symmetrical. R FTN intact but slow, L FTN not able to test due to weakness. Gait not tested.    ASSESSMENT/PLAN Stephen Copeland is a 79 y.o. male with history of Afib, systolic CHF, HLD, CAD, HTN, prostate cancer presenting with left sided weakness, facial droop, dysarthria.   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 embolic secondary to known AF even on Eliquis  Code Stroke CT head evolving R MCA territory infarct (insula, cerebral cortex).  hyperdense R M2 +/- M3. ASPECTS 7    CTA head & neck +LVO. Probable ruptured plaque R ICA bifurcation w/ near occlusive stenosis, possible superimposed dissection. Distal reconstitution R M1 from collaterals but w/ distal embolic occlusion proximal R M2/3 superior division branch. L ICA bifurcation and siphon atherosclerosis. B VA origins 50% stenoses. R subclavian artery origin 60% stenosis.   CT perfusion 19cc core infarct R insula and posterior frontal. 33cc penumbra.   Cerebral angio TICI3 revascularization occluded middle trifurcation R MCA branch, revascularization R ICA occlusion w/ angioplasty and stenting.   Post IR CT contrast stain R putamen and moderate R sylvian fissure, SAH + contrast.    CT head R sylvian fissure SAH. Increased contrast vs hemorrhage R suprasellar cistern following ICA concerned for increased SAH. R sulci effacement.   Carotid Doppler  R ICA stent patent  MRI  Patchy R MCA infarcts. SAH R sylvian fissure. Small IVH.   MRA - Severely motion degraded head MRA. Persistent patency of the revascularized right ICA. No flow limiting proximal stenosis identified.   CT head repeat 5/11 R insular infarct stable. Residual small SAH and small IVH.  2D Echo EF 35-40%. No source of embolus   LDL 90  HgbA1c 5.8  SCDs for VTE prophylaxis  Eliquis (apixaban) daily prior to admission, now on aspirin 81 mg daily and Brilinta (ticagrelor) 90 mg bid. Received during IR. Continue DAPT administration in setting of SAH given new ICA stent. Continue DAPT in setting of coffee ground emesis.   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 Hold AC given post IR SAH and need for DAPT post stent placement. . Consider AC once SAH resolves.    Hypertension  Home meds:  Hydralazine 50 tid, metoprolol 25 bid  Resumed metoprolol 12.5 bid and Hydralazine 25 mg Q8 hrs  . 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 Secondary to stroke . NPO . Did not pass swallow eval . Speech on board . Cortrak placement  . Tube feeding on hold given intubation, vomiting coffee ground emesis (continue DAPT as above)   Other Stroke Risk Factors  Advanced age  Coronary artery disease  Chronic systolic Congestive heart failure  Other Active Problems  GERD on PPI   BPH on flomax PTA  Prostate cancer   Hypokalemia - resolved  ARF on CKD Cre 1.56->2.06 . Renal US pending   Urine NA ok. BUN. pending   LBBB. Appears old. EKG neg ischemia. Trops 27->32  (Likely strain related)  Mild leukocytosis - resolved  Mild acute blood loss anemia Hb 11.9  Hypophosphatemia - 3.2->2.1   Bowel prophylaxis  Hospital Day #5  Continue respiratory support and antibiotics for aspiration pneumonia and respiratory failure as per CCM  team.  Wean off ventilatory support and extubate as tolerated.  Hold off on anticoagulation for now given small amount of residual subarachnoid and intraventricular blood.  Continue aspirin and Brilinta for now.  Wean off sedation and pressor support.  Discussed with Dr. Ruthann Cancer critical care medicine  This patient is critically ill and at significant risk of neurological worsening, death and care requires constant monitoring of vital signs, hemodynamics,respiratory and cardiac monitoring, extensive review of multiple databases, frequent neurological assessment, discussion with family, other specialists and medical decision making of high complexity.I have made any additions or clarifications directly to the above note.This critical care time does not reflect procedure time, or teaching time or supervisory time of PA/NP/Med Resident etc but could involve care discussion time.  I spent 30 minutes of neurocritical care time  in the care of  this patient.    Antony Contras, MD To contact Stroke Continuity provider, please refer to [email or web address] After hours, contact General Neurology

## 2019-07-14 NOTE — Progress Notes (Addendum)
Occupational Therapy Treatment Note  Despite being re-intubated, pt making steady progress. Pt communicating by writing on clipboard. Pt with apparent visual perceptual deficits as noted by pt starting writing middle of the sheet and writing over words at times. Pt requires redirection to mobility as pt perseverating on telling us about his [employer]. Excellent participation. Pt gesturing "running" when asked to participate with therapy.  Contineu to recommend rehab at [facility].     07/14/19 1500  OT Visit Information  Last OT Received On 07/14/19  Assistance Needed +2  PT/OT/SLP Co-Evaluation/Treatment Yes  Reason for Co-Treatment Complexity of the patient's impairments (multi-system involvement);Necessary to address cognition/behavior during functional activity;To address functional/ADL transfers;For patient/therapist safety  OT goals addressed during session ADL's and self-care;Strengthening/ROM  History of Present Illness 79 y.o. male admitted on 07/09/19 for L sided weakness, facial droop, and slurred speech.  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  He did not get any tPA as he was outside of the window.  Pt intubated 5/6 for procedure and extubated in the AM of 07/10/19.  Pt with other significant PMH of sinus brady, scoliosis, prostate CA s/p surgery, HTN, dilated cardiomyopathy, CAD, CHF.  Patient re-intubated 5/10 with inability to clear secretions.  Precautions  Precautions Fall  Precaution Comments left sided weakness; vent, watch HR, OG to suction  Pain Assessment  Pain Assessment Faces  Faces Pain Scale 4  Pain Location both thighs  Pain Descriptors / Indicators Grimacing;Discomfort  Pain Intervention(s) Limited activity within patient's tolerance  Cognition  Arousal/Alertness Awake/alert  Behavior During Therapy Impulsive  Overall Cognitive Status Impaired/Different from baseline  Area of Impairment Attention;Safety/judgement;Memory   Current Attention Level Sustained  Memory Decreased short-term memory;Decreased recall of precautions  Safety/Judgement Decreased awareness of deficits;Decreased awareness of safety  Awareness Emergent  Problem Solving Difficulty sequencing;Slow processing  General Comments patient perseverative on writing to communicate about his [place] history despite needing to attend to mobility tasks, also needing frequent reminders not to pull on tubes after removal of mitts during session.  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 Once upright, pt reaching to hold self up with bed rail. As pt fatigued, pt began to try to hold to tubing for support. Question if pt was dizzy once EOB; Eyes closed more while up EOB  Balance  Sitting balance-Leahy Scale Poor  Sitting balance - Comments A from behind for balance at EOB sitting about 10 minutes pt intent on writing and needing assist and cues for head up and focusing on balancing tasks, reaching for bed rail at times to assist  Vision- Assessment  Additional Comments will further assess; Pt writing on clipboard and then began writing over letters; Staets he has had B lens implants; ? L visual field deficit   Transfers   Lateral/Scoot Transfers Max  assist;+2 physical assistance  General transfer comment scooting up in bed while seated EOB with +2 A sliding up using pad under hips  General Comments  General comments (skin integrity, edema, etc.) HR max 133, SpO2 90's on PRVC mode on vent 40%$ FiO2 PEEP 5; BP seated initially 83/48, then repeat measurement 100/82, then later while sitting 149/77. Apparent spatial/visual perceptual issues; Pt appears to start writing in the middle of the page; As writing progressed down the page, pt writing "over" wrods at times.  Exercises  Exercises General Upper Extremity  General Exercises - Upper Extremity  Shoulder Flexion 5 reps;Both;AAROM;AROM;Supine  Shoulder ABduction AROM;AAROM;Both;5 reps;Supine  Elbow Flexion AROM;AAROM;Both;5 reps;Seated  Elbow Extension AROM;AAROM;Both;5 reps;Supine  Digit Composite Flexion AROM;Strengthening;Both;10 reps;Seated  Composite Extension AROM;Strengthening;Both;5 reps;Seated  OT - End of Session  Activity Tolerance Patient tolerated treatment well  Patient left in bed;with call bell/phone within reach;with bed alarm set;with restraints reapplied;with SCD's reapplied  Nurse Communication Mobility status;Other (comment) (Pt requesting to call his wife)  OT Assessment/Plan  OT Plan Discharge plan remains appropriate  OT Visit Diagnosis Unsteadiness on feet (R26.81);Other abnormalities of gait and mobility (R26.89);Muscle weakness (generalized) (M62.81);Pain;Other symptoms and signs involving cognitive function  Pain - part of body  (B thighs)  OT Frequency (ACUTE ONLY) Min 2X/week  Recommendations for Other Services PT consult;Rehab consult;Speech consult  Follow Up Recommendations CIR;Supervision/Assistance - 24 hour  OT Equipment Other (comment)  AM-PAC OT "6 Clicks" Daily Activity Outcome Measure (Version 2)  Help from another person eating meals? 1  Help from another person taking care of personal grooming? 2   Help from another person toileting, which includes using toliet, bedpan, or urinal? 1  Help from another person bathing (including washing, rinsing, drying)? 2  Help from another person to put on and taking off regular upper body clothing? 2  Help from another person to put on and taking off regular lower body clothing? 1  6 Click Score 9  OT Goal Progression  Progress towards OT goals Progressing toward goals  Acute Rehab OT Goals  Patient Stated Goal to call his wife  OT Goal Formulation With patient  Time For Goal Achievement 07/24/19  Potential to Achieve Goals Good  ADL Goals  Pt Will Perform Grooming with set-up;with supervision;sitting  Pt Will Perform Lower Body Dressing with min assist;sit to/from stand  Pt Will Transfer to Toilet stand pivot transfer;bedside commode;with mod assist  Pt Will Perform Toileting - Clothing Manipulation and hygiene with mod assist;sitting/lateral leans  Additional ADL Goal #1 Pt will sustain attention to simple ADL with Min cues  OT Time Calculation  OT Start Time (ACUTE ONLY) 0916  OT Stop Time (ACUTE ONLY) 0951  OT Time Calculation (min) 35 min  OT General Charges  $OT Visit 1 Visit  OT Treatments  $Therapeutic Activity 8-22 mins  Maurie Boettcher, OT/L   Acute OT Clinical Specialist [place] Pager [phone] Office [phone]

## 2019-07-14 NOTE — Procedures (Signed)
Extubation Procedure Note  Patient Details:   Name: Stephen Copeland DOB: May 12, 1940 MRN: [id number]   Airway Documentation:  Extubated per orders. Patient had positive cuff leak prior to extubation. Placed on 4lpm nasal cannula. Pt has strong cough and able to voice. Will continue to monitor.  Vent end date: 07/14/19 Vent end time: 1644   Evaluation  O2 sats: stable throughout Complications: No apparent complications Patient did tolerate procedure well. Bilateral Breath Sounds: Clear   Yes  Ander Purpura 07/14/2019, 4:46 PM

## 2019-07-14 NOTE — Progress Notes (Signed)
RT NOTES: Transported patient on vent to CT and back to room 1P69 without complications.

## 2019-07-14 NOTE — Progress Notes (Addendum)
Physical Therapy Treatment Patient Details Name: Stephen Copeland MRN: [id number] DOB: 03/01/1941 Today's Date: 07/14/2019    History of Present Illness 79 y.o. male admitted on 07/09/19 for L sided weakness, facial droop, and slurred speech.  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  He did not get any tPA as he was outside of the window.  Pt intubated 5/6 for procedure and extubated in the AM of 07/10/19.  Pt with other significant PMH of sinus brady, scoliosis, prostate CA s/p surgery, HTN, dilated cardiomyopathy, CAD, CHF.  Patient re-intubated 5/10 with inability to clear secretions.    PT Comments    Patient progressing some despite re-intubation yesterday.  Very communicative once we gave him a clip board and pen to write.  He perseverated on task needing redirection to focus on mobility tasks.  He fatigued while sitting EOB with HR up to 130's.  Feel he should progress and be able to participate at COR level of intensity.  PT to follow acutely.    Follow Up Recommendations  CIR     Equipment Recommendations  Rolling walker with 5" wheels;Wheelchair (measurements PT);Wheelchair cushion (measurements PT);3in1 (PT)    Recommendations for Other Services       Precautions / Restrictions Precautions Precautions: Fall Precaution Comments: left sided weakness; vent, watch HR, OG to suction Restrictions Weight Bearing Restrictions: No    Mobility  Bed Mobility Overal bed mobility: Needs Assistance Bed Mobility: Rolling;Sidelying to Sit;Sit to Supine Rolling: Max assist;+2 for physical assistance Sidelying to sit: Total assist;+2 for physical assistance   Sit to supine: Max assist;+2 for physical assistance   General bed mobility comments: not initiating moving legs and seemingly partially due to pain in legs, so flexed knees with assist and rolled with +2 A with minimal pt initiation even given time, assist for legs off bed and to lift trunk; to  supine, pt more participative, but still assist for legs and trunk  Transfers Overall transfer level: Needs assistance   Transfers: Lateral/Scoot Transfers          Lateral/Scoot Transfers: Max assist;+2 physical assistance General transfer comment: scooting up in bed while seated EOB with +2 A sliding up using pad under hips  Ambulation/Gait                 Stairs             Wheelchair Mobility    Modified Rankin (Stroke Patients Only) Modified Rankin (Stroke Patients Only) Pre-Morbid Rankin Score: No symptoms Modified Rankin: Severe disability     Balance Overall balance assessment: Needs assistance Sitting-balance support: Feet supported Sitting balance-Leahy Scale: Poor Sitting balance - Comments: A from behind for balance at EOB sitting about 10 minutes pt intent on writing and needing assist and cues for head up and focusing on balancing tasks, reaching for bed rail at times to assist                                    Cognition Arousal/Alertness: Awake/alert Behavior During Therapy: Impulsive Overall Cognitive Status: Impaired/Different from baseline Area of Impairment: Attention;Safety/judgement;Memory                   Current Attention Level: Sustained Memory: Decreased short-term memory;Decreased recall of precautions   Safety/Judgement: Decreased awareness of deficits;Decreased awareness of safety   Problem Solving: Decreased initiation;Requires verbal cues;Requires tactile cues General Comments: patient  perseverative on writing to communicate about his [place] history despite needing to attend to mobility tasks, also needing frequent reminders not to pull on tubes after removal of mitts during session.      Exercises      General Comments General comments (skin integrity, edema, etc.): HR max 133, SpO2 90's on PRVC mode on vent 40%$ FiO2 PEEP 5; BP seated initially 83/48, then repeat measurement 100/82, then later  while sitting 149/77.      Pertinent Vitals/Pain Faces Pain Scale: Hurts little more Pain Location: both thighs Pain Descriptors / Indicators: Grimacing;Discomfort Pain Intervention(s): Monitored during session;Repositioned    Home Living                      Prior Function            PT Goals (current goals can now be found in the care plan section) Progress towards PT goals: Progressing toward goals    Frequency    Min 4X/week      PT Plan Current plan remains appropriate    Co-evaluation PT/OT/SLP Co-Evaluation/Treatment: Yes Reason for Co-Treatment: For patient/therapist safety;To address functional/ADL transfers;Complexity of the patient's impairments (multi-system involvement) PT goals addressed during session: Mobility/safety with mobility;Balance        AM-PAC PT "6 Clicks" Mobility   Outcome Measure  Help needed turning from your back to your side while in a flat bed without using bedrails?: A Lot Help needed moving from lying on your back to sitting on the side of a flat bed without using bedrails?: Total Help needed moving to and from a bed to a chair (including a wheelchair)?: Total Help needed standing up from a chair using your arms (e.g., wheelchair or bedside chair)?: Total Help needed to walk in hospital room?: Total Help needed climbing 3-5 steps with a railing? : Total 6 Click Score: 7    End of Session   Activity Tolerance: Patient tolerated treatment well Patient left: in bed;with call bell/phone within reach;with bed alarm set;with restraints reapplied   PT Visit Diagnosis: Muscle weakness (generalized) (M62.81);Other abnormalities of gait and mobility (R26.89);Other symptoms and signs involving the nervous system (R29.898);Hemiplegia and hemiparesis Hemiplegia - Right/Left: Left Hemiplegia - dominant/non-dominant: Non-dominant Hemiplegia - caused by: Cerebral infarction     Time: 0918-0950 PT Time Calculation (min) (ACUTE  ONLY): 32 min  Charges:  $Therapeutic Activity: 8-22 mins                     Magda Kiel, [place] [facility] [phone] 07/14/2019    Reginia Naas 07/14/2019, 11:28 AM

## 2019-07-15 ENCOUNTER — Inpatient Hospital Stay (HOSPITAL_COMMUNITY): Payer: Medicare Other

## 2019-07-15 DIAGNOSIS — Z8546 Personal history of malignant neoplasm of prostate: Secondary | ICD-10-CM

## 2019-07-15 DIAGNOSIS — N183 Chronic kidney disease, stage 3 unspecified: Secondary | ICD-10-CM

## 2019-07-15 DIAGNOSIS — R451 Restlessness and agitation: Secondary | ICD-10-CM

## 2019-07-15 DIAGNOSIS — Z9981 Dependence on supplemental oxygen: Secondary | ICD-10-CM

## 2019-07-15 DIAGNOSIS — K56609 Unspecified intestinal obstruction, unspecified as to partial versus complete obstruction: Secondary | ICD-10-CM

## 2019-07-15 DIAGNOSIS — I639 Cerebral infarction, unspecified: Secondary | ICD-10-CM | POA: Insufficient documentation

## 2019-07-15 DIAGNOSIS — I10 Essential (primary) hypertension: Secondary | ICD-10-CM

## 2019-07-15 DIAGNOSIS — I5021 Acute systolic (congestive) heart failure: Secondary | ICD-10-CM

## 2019-07-15 DIAGNOSIS — I480 Paroxysmal atrial fibrillation: Secondary | ICD-10-CM

## 2019-07-15 DIAGNOSIS — I6601 Occlusion and stenosis of right middle cerebral artery: Secondary | ICD-10-CM

## 2019-07-15 DIAGNOSIS — I4891 Unspecified atrial fibrillation: Secondary | ICD-10-CM

## 2019-07-15 DIAGNOSIS — I2510 Atherosclerotic heart disease of native coronary artery without angina pectoris: Secondary | ICD-10-CM

## 2019-07-15 LAB — HEMOGLOBIN AND HEMATOCRIT, BLOOD
HCT: 32.4 % — ABNORMAL LOW (ref 39.0–52.0)
HCT: 30.6 % — ABNORMAL LOW (ref 39.0–52.0)
Hemoglobin: 10.7 g/dL — ABNORMAL LOW (ref 13.0–17.0)
Hemoglobin: 10.1 g/dL — ABNORMAL LOW (ref 13.0–17.0)

## 2019-07-15 LAB — CBC
HCT: 31.1 % — ABNORMAL LOW (ref 39.0–52.0)
Hemoglobin: 10.1 g/dL — ABNORMAL LOW (ref 13.0–17.0)
MCH: 31.3 pg (ref 26.0–34.0)
MCHC: 32.5 g/dL (ref 30.0–36.0)
MCV: 96.3 fL (ref 80.0–100.0)
Platelets: 205 10*3/uL (ref 150–400)
RBC: 3.23 MIL/uL — ABNORMAL LOW (ref 4.22–5.81)
RDW: 13.7 % (ref 11.5–15.5)
WBC: 8.1 10*3/uL (ref 4.0–10.5)
nRBC: 0 % (ref 0.0–0.2)

## 2019-07-15 LAB — GLUCOSE, CAPILLARY
Glucose-Capillary: 92 mg/dL (ref 70–99)
Glucose-Capillary: 102 mg/dL — ABNORMAL HIGH (ref 70–99)
Glucose-Capillary: 122 mg/dL — ABNORMAL HIGH (ref 70–99)
Glucose-Capillary: 118 mg/dL — ABNORMAL HIGH (ref 70–99)
Glucose-Capillary: 109 mg/dL — ABNORMAL HIGH (ref 70–99)

## 2019-07-15 LAB — BASIC METABOLIC PANEL
Anion gap: 9 (ref 5–15)
BUN: 51 mg/dL — ABNORMAL HIGH (ref 8–23)
CO2: 23 mmol/L (ref 22–32)
Calcium: 8.7 mg/dL — ABNORMAL LOW (ref 8.9–10.3)
Chloride: 111 mmol/L (ref 98–111)
Creatinine, Ser: 1.87 mg/dL — ABNORMAL HIGH (ref 0.61–1.24)
GFR calc Af Amer: 39 mL/min — ABNORMAL LOW (ref >60)
GFR calc non Af Amer: 34 mL/min — ABNORMAL LOW (ref >60)
Glucose, Bld: 102 mg/dL — ABNORMAL HIGH (ref 70–99)
Potassium: 4.5 mmol/L (ref 3.5–5.1)
Sodium: 143 mmol/L (ref 135–145)

## 2019-07-15 LAB — UREA NITROGEN, URINE: Urea Nitrogen, Ur: 948 mg/dL

## 2019-07-15 LAB — CULTURE, BAL-QUANTITATIVE W GRAM STAIN: Culture: 30000 — AB

## 2019-07-15 MED ORDER — LABETALOL HCL 5 MG/ML IV SOLN
20.0000 mg | INTRAVENOUS | Status: DC | PRN
  Administered 2019-07-15 – 2019-08-04 (×2): 20 mg via INTRAVENOUS
  Filled 2019-07-15 (×2): qty 4

## 2019-07-15 MED ORDER — HYDRALAZINE HCL 20 MG/ML IJ SOLN
20.0000 mg | Freq: Four times a day (QID) | INTRAMUSCULAR | Status: DC | PRN
  Administered 2019-07-23: 20 mg via INTRAVENOUS
  Filled 2019-07-15: qty 1

## 2019-07-15 MED ORDER — HYDRALAZINE HCL 25 MG PO TABS
25.0000 mg | ORAL_TABLET | Freq: Three times a day (TID) | ORAL | Status: DC
  Administered 2019-07-15 – 2019-08-26 (×119): 25 mg
  Filled 2019-07-15 (×121): qty 1

## 2019-07-15 NOTE — Progress Notes (Signed)
Inpatient Rehabilitation Admissions Coordinator  Inpatient rehab consult received. I have left a message for patient's wife to contact me to continue discussions concerning rehab venue options and caregiver support. I will follow up tomorrow.  Danne Baxter, RN, MSN Rehab Admissions Coordinator [phone] 07/15/2019 1:54 PM

## 2019-07-15 NOTE — Consult Note (Signed)
Physical Medicine and Rehabilitation Consult  Reason for Consult: Stroke with functional decline.  Referring Physician: Dr. Leonie Man.    HPI: Stephen Copeland is a 79 y.o. male with history of HTN, prostate cancer, PUD, CAD w/CM/CHF, Afib-on Eliquis, CKDIII who was admitted on 07/09/2010 after waking up with left hemiparesis and dysarthria.  History taken from chart review due to dysarthria  +/-cognitive limitations.  Patient states his wife will be his caretaker at discharge.  He was found to have AFib with RVR and HR in 130s. 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 He was out of window for tPA and underwent cerebral angio with complete revascularization of middle trunk of R-MCA with penumbra aspiration and revascularization of acute symptomatic occlusion of R-ICA proximally with stent assisted angio by Dr. Estanislado Pandy. Follow up CT personally reviewed, showing moderate sized temporal lobe infarct.  He tolerated extubation on 07/10/2019 and on DAPT secondary to stent. MRI/MRA brain revealed patchy right MCA infarcts with SAH and small volume IVH. Follow up carotid dopplers showed R-ICA stent to be patent. Hospital course further complicated by lethargy and and had aspiration event due to recurrent vomiting episodes--suctioned for 300 cc coffee ground gastric contents reintubated on 07/13/2019.  Bronchoscopy showed moderate thick secretions --worse and purulent on the right. Septic shock treated with pressors and IV Unasyn. NGT placed to suction due to concerns of SBO.  He tolerated extubation yesterday and therapy evaluations completed revealing  significant weakness, visual perceptual deficits, perseveration and garbled speech. CIR recommended due to functional decline.   Review of Systems  Unable to perform ROS: Language  +/- Dysarthria  Past Medical History:  Diagnosis Date  . Acid reflux   . CHF (congestive heart failure) ([place])   . Coronary artery disease   . Dilated cardiomyopathy ([place])   . Folliculitis   . Gastric ulcer   . Hiatal hernia   . Hyperlipidemia    a. pt is adamantly against statins.  . Hypertension   . Ischemia    a. Pt states he was diagnosed with "ischemia" in the 1990s but does not know further details, denies hx of heart blockage.  . Nummular dermatitis   . Prostate cancer ([place])   . Renal disorder   . Scoliosis   . Sinus bradycardia   . Stomach ulcer    a. remote ulcer in the 1990s, no hx of bleeding.    Past Surgical History:  Procedure Laterality Date  . INGUINAL HERNIA REPAIR Left   . IR ANGIO INTRA EXTRACRAN SEL COM CAROTID INNOMINATE UNI L MOD SED  07/10/2019  . IR ANGIO VERTEBRAL SEL SUBCLAVIAN INNOMINATE UNI R MOD SED  07/10/2019  . IR CT HEAD LTD  07/10/2019  . IR INTRAVSC STENT CERV CAROTID W/O EMB-PROT MOD SED INC ANGIO  07/10/2019  . IR PERCUTANEOUS ART THROMBECTOMY/INFUSION INTRACRANIAL INC DIAG ANGIO  07/10/2019  . RADIOLOGY WITH ANESTHESIA N/A 07/09/2019   Procedure: IR WITH ANESTHESIA;  Surgeon: Luanne Bras, MD;  Location: [place];  Service: Radiology;  Laterality: N/A;    Family History  Problem Relation Age of Onset  . Heart disease Mother        Further details not  reported, died at age 35  . Valvular heart disease Father        H/o MV surgery, diet at age 22    Social History:  Married. Reports wife is 15 years old. He reports that he has never smoked. He has never used smokeless tobacco. He reports that he does not drink alcohol or use drugs.    Allergies: No Known Allergies    Medications Prior to Admission  Medication Sig Dispense Refill  . apixaban (ELIQUIS) 5 MG  TABS tablet Take 1 tablet (5 mg total) by mouth 2 (two) times daily. 180 tablet 1  . Calcium Carb-Cholecalciferol (CALCIUM 600-D PO) Take 1 tablet by mouth daily.    . Cholecalciferol (VITAMIN D) 50 MCG (2000 UT) tablet Take 2,000 Units by mouth daily.    . Coenzyme Q10 (COQ10) 100 MG CAPS Take 100 mg by mouth daily.    . hydrALAZINE (APRESOLINE) 50 MG tablet TAKE 1 TABLET THREE TIMES A DAY (NEED FEBRUARY APPOINTMENT FOR FURTHER REFILLS THANKS ) (Patient taking differently: Take 50 mg by mouth 3 (three) times daily. ) 180 tablet 2  . hydrocortisone cream 1 % Apply 1 application topically 2 (two) times daily as needed for itching.     . lidocaine (XYLOCAINE) 5 % ointment Apply 1 application topically 2 (two) times daily as needed (pain).     . metoprolol tartrate (LOPRESSOR) 25 MG tablet TAKE 1 TABLET TWICE A DAY (Patient taking differently: Take 25 mg by mouth 2 (two) times daily. ) 180 tablet 2  . nitroGLYCERIN (NITROSTAT) 0.4 MG SL tablet Place 0.4 mg under the tongue every 5 (five) minutes as needed for chest pain.     Marland Kitchen omeprazole (PRILOSEC) 20 MG capsule Take 1 capsule (20 mg total) by mouth daily. (Patient taking differently: Take 20 mg by mouth daily as needed (acid reflux/indigestion). ) 90 capsule 3  . tamsulosin (FLOMAX) 0.4 MG CAPS capsule Take 0.4 mg by mouth daily after supper.       Home: Home Living Family/patient expects to be discharged to:: Private residence Living Arrangements: Spouse/significant other Available Help at Discharge: Family Type of Home: House Home Access: Level entry [place]: One level Bathroom Shower/Tub: Tub only(has chair in tub) [place]: Standard Bathroom Accessibility: No Additional Comments: Pt was a bit tangental, so we were unable to get a great home set up.   Lives With: Spouse  Functional History: Prior Function Level of Independence: Independent Functional Status:  Mobility: Bed Mobility Overal bed mobility: Needs  Assistance Bed Mobility: Rolling, Sidelying to Sit, Sit to Supine Rolling: Max assist, +2 for physical assistance Sidelying to sit: +2 for physical assistance, Max assist Supine to sit: Max assist, +2 for physical assistance Sit to supine: Max assist, +2 for physical assistance General bed mobility comments: more difficulty with sequencing to move legs off bed. Once upright, pt reaching to hold self up with bed rail. As pt fatigued, pt began to try to hold to tubing for support. Question if pt was dizzy once EOB; Eyes closed more while up EOB Transfers Overall transfer level: Needs assistance Equipment used: 2 person hand held assist Transfers: Lateral/Scoot Transfers Sit to Stand: Max assist, +2 physical assistance, From elevated surface  Lateral/Scoot Transfers: Max assist, +2 physical assistance General transfer comment: scooting up in bed while seated EOB with +2 A sliding up using pad under hips Ambulation/Gait General Gait Details: unable at this time.     ADL: ADL Overall ADL's : Needs assistance/impaired  Eating/Feeding: NPO Grooming: Minimal assistance, Wash/dry face Upper Body Bathing: Minimal assistance, Sitting Lower Body Bathing: Maximal assistance, +2 for physical assistance, +2 for safety/equipment, Sit to/from stand Upper Body Dressing : Minimal assistance, Sitting Lower Body Dressing: Maximal assistance, Sit to/from stand, +2 for physical assistance Lower Body Dressing Details (indicate cue type and reason): Donn socks at bed level. 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  Cognition: Cognition Overall Cognitive Status: Impaired/Different from baseline Arousal/Alertness: Awake/alert Orientation Level: Oriented X4 Attention: Sustained Sustained Attention: Impaired Sustained Attention Impairment: Functional complex Behaviors: Impulsive Cognition Arousal/Alertness: Awake/alert Behavior During Therapy: Impulsive Overall Cognitive Status: Impaired/Different from baseline Area of Impairment: Attention, Safety/judgement, Memory Current Attention Level: Sustained Memory: Decreased short-term memory, Decreased recall of precautions Safety/Judgement: Decreased awareness of deficits, Decreased awareness of safety Awareness: Emergent Problem Solving: Difficulty sequencing, Slow processing General Comments: patient perseverative on writing to communicate about his [place] history despite needing to attend to mobility tasks, also needing frequent reminders not to pull on tubes after removal of mitts during session.   Blood pressure (!) 151/82, pulse 87, temperature 97.9 F (36.6 C), temperature source Oral, resp. rate 18, height 6' (1.829 m), weight 86.2 kg, SpO2 95 %. Physical Exam  Nursing note and vitals reviewed. Constitutional: He is oriented to person, place, and time. He appears well-developed and well-nourished.  HENT:  Head: Normocephalic and atraumatic.  Right Ear: External ear normal.  Left Ear: External ear normal.  + NG  Eyes: EOM are normal. Right eye exhibits no discharge. Left eye exhibits no discharge.  Neck: No tracheal deviation present. No thyromegaly present.  Respiratory: Effort normal. No stridor. He has wheezes.  + [place]  GI: Soft. He exhibits no distension.  Musculoskeletal:     Comments: No edema or tenderness in extremities  Neurological: He is alert and  oriented to person, place, and time.  Left facial weakness Dysarthria Perseverative and needed redirection to follow simple motor commands.  Motor: Bilateral upper extremities: 5/5 proximal distal  Right lower extremity: Flexion, knee extension 4+/5, ankle dorsiflexion 5/5 Left lower extremity: Hip flexion, knee flexion 4/5, ankle dorsiflexion 5/5  Skin: Skin is warm and dry.  Psychiatric: His mood appears anxious. His speech is delayed, tangential and slurred. He is slowed.  Distracted    Results for orders placed or performed during the hospital encounter of 07/09/19 (from the past 24 hour(s))  Hemoglobin and hematocrit, blood     Status: Abnormal   Collection Time: 07/14/19 10:09 AM  Result Value Ref Range   Hemoglobin 11.0 (L) 13.0 - 17.0 g/dL   HCT 33.3 (L) 39.0 - 52.0 %  Glucose, capillary     Status: Abnormal   Collection Time: 07/14/19 11:32 AM  Result Value Ref Range   Glucose-Capillary 140 (H) 70 - 99 mg/dL  Glucose, capillary     Status: None   Collection Time: 07/14/19  3:38 PM  Result Value Ref Range   Glucose-Capillary 89 70 - 99 mg/dL   Comment 1 Notify RN  Comment 2 Document in Chart   Hemoglobin and hematocrit, blood     Status: Abnormal   Collection Time: 07/14/19  3:52 PM  Result Value Ref Range   Hemoglobin 10.5 (L) 13.0 - 17.0 g/dL   HCT 31.5 (L) 39.0 - 52.0 %  Glucose, capillary     Status: None   Collection Time: 07/14/19  7:55 PM  Result Value Ref Range   Glucose-Capillary 95 70 - 99 mg/dL  Hemoglobin and hematocrit, blood     Status: Abnormal   Collection Time: 07/14/19 10:54 PM  Result Value Ref Range   Hemoglobin 10.2 (L) 13.0 - 17.0 g/dL   HCT 30.8 (L) 39.0 - 52.0 %  Glucose, capillary     Status: None   Collection Time: 07/14/19 11:37 PM  Result Value Ref Range   Glucose-Capillary 91 70 - 99 mg/dL  CBC     Status: Abnormal   Collection Time: 07/15/19  6:05 AM  Result Value Ref Range   WBC 8.1 4.0 - 10.5 K/uL   RBC 3.23 (L) 4.22 - 5.81  MIL/uL   Hemoglobin 10.1 (L) 13.0 - 17.0 g/dL   HCT 31.1 (L) 39.0 - 52.0 %   MCV 96.3 80.0 - 100.0 fL   MCH 31.3 26.0 - 34.0 pg   MCHC 32.5 30.0 - 36.0 g/dL   RDW 13.7 11.5 - 15.5 %   Platelets 205 150 - 400 K/uL   nRBC 0.0 0.0 - 0.2 %  Basic metabolic panel     Status: Abnormal   Collection Time: 07/15/19  6:05 AM  Result Value Ref Range   Sodium 143 135 - 145 mmol/L   Potassium 4.5 3.5 - 5.1 mmol/L   Chloride 111 98 - 111 mmol/L   CO2 23 22 - 32 mmol/L   Glucose, Bld 102 (H) 70 - 99 mg/dL   BUN 51 (H) 8 - 23 mg/dL   Creatinine, Ser 1.87 (H) 0.61 - 1.24 mg/dL   Calcium 8.7 (L) 8.9 - 10.3 mg/dL   GFR calc non Af Amer 34 (L) >60 mL/min   GFR calc Af Amer 39 (L) >60 mL/min   Anion gap 9 5 - 15  Glucose, capillary     Status: None   Collection Time: 07/15/19  8:01 AM  Result Value Ref Range   Glucose-Capillary 92 70 - 99 mg/dL   Comment 1 Notify RN    Comment 2 Document in Chart    CT Head Wo Contrast  Result Date: 07/14/2019 CLINICAL DATA:  Follow-up right stroke EXAM: CT HEAD WITHOUT CONTRAST TECHNIQUE: Contiguous axial images were obtained from the base of the skull through the vertex without intravenous contrast. COMPARISON:  MRI 07/10/2019.  CT 07/10/2019. FINDINGS: Brain: Low density is now present affecting a 3 cm region of brain in the right insula consistent with the largest area of acute infarction demonstrated by the recent MRI. Other smaller areas of acute infarction in the right MCA territory are not specifically visible by CT. Chronic small-vessel ischemic changes are noted within both basal ganglia regions and within the cerebral hemispheric deep white matter. Small amount of residual hyperdense subarachnoid blood evident in the sylvian fissure and a few of the frontoparietal sulci. Small amount blood dependent in the occipital horn of the right lateral ventricle. No new area of infarction is seen. No hydrocephalus. No extra-axial collection. Vascular: There is  atherosclerotic calcification of the major vessels at the base of the brain. Skull: Negative Sinuses/Orbits: Sinuses are clear except  for small amount of fluid layering in the left maxillary sinus. Orbits appear normal. Other: None IMPRESSION: 3 cm region of low-density now evident in the right insula consistent with the largest region of acute infarction demonstrated on the previous MRI. No evidence of hemorrhagic transformation or mass effect. Small amount of residual subarachnoid blood. Small amount of intraventricular blood. No hydrocephalus. Electronically Signed   By: Nelson Chimes M.D.   On: 07/14/2019 11:32   US RENAL  Result Date: 07/15/2019 CLINICAL DATA:  Acute kidney injury. EXAM: RENAL / URINARY TRACT ULTRASOUND COMPLETE COMPARISON:  CT abdomen pelvis and renal ultrasound dated May 31, 2016. FINDINGS: Right Kidney: Renal measurements: 8.7 x 4.1 x 3.2 cm = volume: 59 mL. Increased echogenicity. No mass or hydronephrosis visualized. Left Kidney: Renal measurements: 11.6 x 4.8 x 3.4 cm = volume: 100 mL. Echogenicity is within normal limits. Round Mass-like area in the midpole corresponds to prominent renal parenchyma that is unchanged in appearance when compared to prior studies. No hydronephrosis visualized. Bladder: Decompressed by Foley catheter. Other: None. IMPRESSION: 1. Increased echogenicity within the right kidney, consistent with underlying medical renal disease. 2. No hydronephrosis. Electronically Signed   By: Titus Dubin M.D.   On: 07/15/2019 06:58   DG CHEST PORT 1 VIEW  Result Date: 07/14/2019 CLINICAL DATA:  Acute respiratory failure with hypoxia. EXAM: PORTABLE CHEST 1 VIEW COMPARISON:  Jul 13, 2019. FINDINGS: Stable cardiomegaly. Bibasilar subsegmental atelectasis is noted. Endotracheal, nasogastric and feeding tubes are in good position. No pneumothorax is noted. Bony thorax is unremarkable. IMPRESSION: Stable bibasilar subsegmental atelectasis. Stable support apparatus.  Electronically Signed   By: Marijo Conception M.D.   On: 07/14/2019 08:29   DG Chest Port 1 View  Result Date: 07/13/2019 CLINICAL DATA:  Endotracheal tube and OG tube placement. EXAM: PORTABLE CHEST 1 VIEW COMPARISON:  One-view chest x-ray 07/13/2019 at 4:47 a.m. FINDINGS: The heart is enlarged. Atherosclerotic calcifications are present at the aortic arch. The patient is intubated. Endotracheal tube terminates 8 cm above the carina, at the level of the clavicles. A second enteric tube has been placed and courses off the inferior border of the film. Bibasilar airspace opacities are noted. Upper lung fields are clear. IMPRESSION: 1. Endotracheal tube terminates 8 cm above the carina, at the level of the clavicles. 2. Interval placement of second enteric tube. 3. Bibasilar airspace disease likely reflects atelectasis. 4. Cardiomegaly without failure. Electronically Signed   By: San Morelle M.D.   On: 07/13/2019 11:14   DG Abd Portable 1V  Result Date: 07/14/2019 CLINICAL DATA:  NG tube placement. EXAM: PORTABLE ABDOMEN - 1 VIEW COMPARISON:  Single-view of the abdomen 07/14/2019 scratch the single view of the abdomen earlier today. FINDINGS: Feeding tube has been removed. NG tube is in place with the tip and side-port in the stomach. Gaseous distention of small bowel noted. IMPRESSION: NG tube in good position. Electronically Signed   By: Inge Rise M.D.   On: 07/14/2019 22:07   DG Abd Portable 1V  Result Date: 07/14/2019 CLINICAL DATA:  Small bowel obstruction. EXAM: PORTABLE ABDOMEN - 1 VIEW COMPARISON:  Jul 13, 2019. FINDINGS: The bowel gas pattern is normal. Distal tip of feeding and nasogastric tubes are seen in the expected position of the distal stomach. No radio-opaque calculi or other significant radiographic abnormality are seen. IMPRESSION: No evidence of bowel obstruction or ileus. Distal tip of feeding and nasogastric tubes are seen in expected position of the distal stomach.  Electronically Signed  By: Marijo Conception M.D.   On: 07/14/2019 08:30   DG Abd Portable 1V  Result Date: 07/13/2019 CLINICAL DATA:  OG tube placement EXAM: PORTABLE ABDOMEN - 1 VIEW COMPARISON:  07/10/2019 FINDINGS: NG tube is in place with the tip in the distal stomach. Feeding tube is in place with the tip in the mid stomach. Gaseous distention of small bowel loops concerning for small bowel obstruction. No free air or organomegaly. IMPRESSION: NG tube and feeding tube in the stomach as above. Worsening small bowel gaseous dilatation concerning for small bowel obstruction. Electronically Signed   By: Rolm Baptise M.D.   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  Stroke: Continue secondary stroke prophylaxis and Risk Factor Modification listed below:   Antiplatelet therapy:   Blood Pressure Management:  Continue current medication with prn's with permisive HTN per primary team Statin Agent:   Labs and images (see above) independently reviewed.  Records reviewed and summated above.  1. Does the need for close, 24 hr/day medical supervision in concert with the patient's rehab needs make it unreasonable for this patient to be served in a less intensive setting? Yes  2. Co-Morbidities requiring supervision/potential complications: HTN (monitor and provide prns in accordance with increased physical exertion and pain), prostate cancer, PUD, CAD w/CM/CHF (Monitor in accordance with increased physical activity and avoid UE resistance excercises), Afib (continue meds, monitor with increased exertion), CKDIII avoid nephrotoxic meds, repeat labs), agitation (wean IV fentanyl when appropriate), supplemental oxygen dependent (wean as tolerated), SBO (advance as tolerated) 3. Due to bladder management, bowel management, safety, skin/wound care, disease management, medication administration, pain management and patient education, does the patient  require 24 hr/day rehab nursing? Yes 4. Does the patient require coordinated care of a physician, rehab nurse, therapy disciplines of PT/OT/SLP to address physical and functional deficits in the context of the above medical diagnosis(es)? Yes Addressing deficits in the following areas: balance, endurance, locomotion, strength, transferring, bowel/bladder control, bathing, dressing, feeding, grooming, toileting, cognition, speech, language, swallowing and psychosocial support 5. Can the patient actively participate in an intensive therapy program of at least 3 hrs of therapy per day at least 5 days per week? Yes 6. The potential for patient to make measurable gains while on inpatient rehab is excellent 7. Anticipated functional outcomes upon discharge from inpatient rehab are supervision and min assist  with PT, supervision and min assist with OT, supervision with SLP. 8. Estimated rehab length of stay to reach the above functional goals is: 16-20 days. 9. Anticipated discharge destination: TBD 10. Overall Rehab/Functional Prognosis: good  RECOMMENDATIONS: This patient's condition is appropriate for continued rehabilitative care in the following setting: Recommend CIR if caregiver assistance available upon discharge, however, patient states ?he would like to go to [place]. Patient has agreed to participate in recommended program. Potentially Note that insurance prior authorization may be required for reimbursement for recommended care.  Comment: Rehab Admissions Coordinator to follow up.  I have personally performed a face to face diagnostic evaluation, including, but not limited to relevant history and physical exam findings, of this patient and developed relevant assessment and plan.  Additionally, I have reviewed and concur with the physician assistant's documentation above.   Delice Lesch, MD, ABPMR Bary Leriche, PA-C 07/15/2019

## 2019-07-15 NOTE — Progress Notes (Signed)
Physical Therapy Treatment Patient Details Name: Stephen Copeland MRN: [id number] DOB: 1941/01/02 Today's Date: 07/15/2019    History of Present Illness 79 y.o. male admitted on 07/09/19 for L sided weakness, facial droop, and slurred speech.  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  He did not get any tPA as he was outside of the window.  Pt intubated 5/6 for procedure and extubated in the AM of 07/10/19.  Pt with other significant PMH of sinus brady, scoliosis, prostate CA s/p surgery, HTN, dilated cardiomyopathy, CAD, CHF.  Patient re-intubated 5/10 with inability to clear secretions.. Extubated 5/11.     PT Comments    Patient needing much encouragement and assist to participate this pm.  He was up in chair earlier with OT.  Worked on EOB sitting balance and attempted sit to stand pulling up on chair back, but he was too distracted to participate enough for successful trials.  Pt seems more appropriate for SNF level rehab as does not have capable 24 hour assist at home and  And would be relying on friends and other family.  Feel continued skilled PT in the acute setting indicated as well.   Follow Up Recommendations  SNF     Equipment Recommendations  Rolling walker with 5" wheels;Wheelchair (measurements PT);Wheelchair cushion (measurements PT);3in1 (PT)    Recommendations for Other Services       Precautions / Restrictions Precautions Precautions: Fall Precaution Comments: coretrack    Mobility  Bed Mobility Overal bed mobility: Needs Assistance Bed Mobility: Supine to Sit     Supine to sit: Max assist;+2 for physical assistance        Transfers Overall transfer level: Needs assistance   Transfers: Sit to/from Stand Sit to Stand: Max assist;+2 physical assistance            Ambulation/Gait                 Stairs             Wheelchair Mobility    Modified Rankin (Stroke Patients Only) Modified Rankin (Stroke  Patients Only) Pre-Morbid Rankin Score: No symptoms Modified Rankin: Severe disability     Balance Overall balance assessment: Needs assistance Sitting-balance support: Feet supported Sitting balance-Leahy Scale: Fair Sitting balance - Comments: sitting EOB about 10 minutes trying to get pt to stand for linen change, continued to complain about various issues     Standing balance-Leahy Scale: Poor Standing balance comment: Ablet o stand holding bar of Stedy while periarea cleaned form incontinent episode                            Cognition Arousal/Alertness: Awake/alert Behavior During Therapy: Agitated;Impulsive Overall Cognitive Status: Impaired/Different from baseline Area of Impairment: Orientation;Attention;Memory;Following commands;Safety/judgement;Awareness;Problem solving                 Orientation Level: Disoriented to;Time Current Attention Level: Sustained Memory: Decreased recall of precautions;Decreased short-term memory Following Commands: Follows one step commands with increased time Safety/Judgement: Decreased awareness of safety;Decreased awareness of deficits Awareness: Emergent Problem Solving: Slow processing;Decreased initiation;Difficulty sequencing;Requires verbal cues;Requires tactile cues General Comments: distracted by tubes especially in his nose telling me the same story twice about the girl from [facility] who jabbed the wire down his nose (for coretrack). Also so distracted by lines, frustractions, discomforts could not sequence to stand after several attempts      Exercises  General Comments General comments (skin integrity, edema, etc.): VSS with EOB activity on 2L O2      Pertinent Vitals/Pain Pain Assessment: Faces Faces Pain Scale: Hurts even more Pain Location: nose Pain Descriptors / Indicators: Grimacing;Moaning;Discomfort Pain Intervention(s): Limited activity within patient's tolerance    Home Living  Family/patient expects to be discharged to:: Private residence                    Prior Function            PT Goals (current goals can now be found in the care plan section) Acute Rehab PT Goals Patient Stated Goal: to go to [facility] Progress towards PT goals: Progressing toward goals(limited)    Frequency    Min 3X/week      PT Plan Discharge plan needs to be updated;Frequency needs to be updated    Co-evaluation              AM-PAC PT "6 Clicks" Mobility   Outcome Measure  Help needed turning from your back to your side while in a flat bed without using bedrails?: A Lot Help needed moving from lying on your back to sitting on the side of a flat bed without using bedrails?: Total Help needed moving to and from a bed to a chair (including a wheelchair)?: Total Help needed standing up from a chair using your arms (e.g., wheelchair or bedside chair)?: Total Help needed to walk in hospital room?: Total Help needed climbing 3-5 steps with a railing? : Total 6 Click Score: 7    End of Session Equipment Utilized During Treatment: Gait belt Activity Tolerance: Patient limited by fatigue;Treatment limited secondary to agitation Patient left: in bed;with call bell/phone within reach;with bed alarm set   PT Visit Diagnosis: Muscle weakness (generalized) (M62.81);Other abnormalities of gait and mobility (R26.89);Other symptoms and signs involving the nervous system (R29.898);Hemiplegia and hemiparesis Hemiplegia - Right/Left: Left Hemiplegia - dominant/non-dominant: Non-dominant Hemiplegia - caused by: Cerebral infarction     Time: 1532-1610 PT Time Calculation (min) (ACUTE ONLY): 38 min  Charges:  $Therapeutic Activity: 23-37 mins                     Magda Kiel, [place] [facility] [phone] 07/15/2019    Reginia Naas 07/15/2019, 5:38 PM

## 2019-07-15 NOTE — Progress Notes (Signed)
NAME:  Stephen Copeland, MRN:  [id number], DOB:  1940-03-18, LOS: 6 ADMISSION DATE:  07/09/2019, CONSULTATION DATE:  5/6 REFERRING MD:  Dr Rory Percy, CHIEF COMPLAINT:  CVA  Brief History   79 y/o M admitted 5/6 with right sided CVA and remained intubated post thrombectomy and carotid stent. 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 Estanislado Pandy note. Rt ICA stend, thrombectomy Rt MCA Bronch 5/11 Significant Diagnostic Tests:   CT head 5/6 > Evolving acute ischemic right MCA territory infarct involving the right insula and overlying supra ganglionic right cerebral cortex. No associated hemorrhage.  CTA head/neck 5/6 > Probable ruptured plaque at the right carotid bifurcation with associated severe near occlusive stenosis. Markedly attenuated and diminutive flow distally to the right ICA terminus. Possible superimposed dissection may be present. Distal reconstitution of the right M1 segment via collateral flow across the circle-of-Willis but with distal embolic occlusion of a proximal right M2/M3 branch  ECHO 5/7 > LVEF 35-40%, LV with global hypokinesis, RV systolic function normal 5/7 cth: 1. Subarachnoid hemorrhage within the right Sylvian fissure is again noted. 2. Additional contrast or hemorrhage along the right side of the suprasellar cistern following the course of the right internal carotid artery has increased. This is concerning for additional subarachnoid hemorrhage. 3. Some effacement of sulci over the right hemisphere without significant mass effect on the right lateral ventricle. 4. Stable  diffuse white matter disease. MRI/MRI brain 5/7: 1. Patchy acute right MCA infarcts. 2. Subarachnoid hemorrhage greatest in the right sylvian fissure as seen on earlier CT. 3. Small volume intraventricular hemorrhage. 4. Severely motion degraded head MRA. Persistent patency of the revascularized right ICA. No flow limiting proximal stenosis identified. 5/7 u/s carotid: Right ICA stent appears to be widely patent with no  evidence of obstruction.  5/12 renal u/s: 1. Increased echogenicity within the right kidney, consistent with underlying medical renal disease. 2. No hydronephrosis. 5/11 cth: 3 cm region of low-density now evident in the right insula consistent with the largest region of acute infarction demonstrated on the previous MRI. No evidence of hemorrhagic transformation or mass effect. Small amount of residual subarachnoid blood. Small amount of intraventricular blood. No hydrocephalus.  Micro Data:  Flu/Covid PCR 5/6 >> neg 5/7 mrsa pcr: positive 5/10 BAL:  gnr  Antimicrobials:  Unasyn 5/10 >>   Interim history/subjective:  5/12: extubated yesterday afternoon without issues. Renal indices improved and u/s neg for hydro 5/11: no acute events overnight. Remains on norepi at this time.  5/10:RRT called PCCM regarding concern for respiratory decline this am after n/v episodes.    Objective   Blood pressure (!) 174/89, pulse 69, temperature 98.8 F (37.1 C), temperature source Oral, resp. rate 13, height 6' (1.829 m), weight 86.2 kg, SpO2 98 %.    Vent Mode: PSV;CPAP FiO2 (%):  [40 %] 40 % Set Rate:  [20 bmp] 20 bmp Vt Set:  [387 mL] 620 mL PEEP:  [5 cmH20] 5 cmH20 Pressure Support:  [10 cmH20] 10 cmH20   Intake/Output Summary (Last 24 hours) at 07/15/2019 0732  Last data filed at 07/15/2019 0700 Gross per 24 hour  Intake 2335.41 ml  Output 2215 ml  Net 120.41 ml   Filed Weights   07/10/19 0115 07/12/19 0700 07/14/19 0500  Weight: 84.3 kg 86.9 kg 86.2 kg     Examination: General: elderly male reclining comfortably in bed attempting to communicate, garbled speech HEENT: MM pink/moist, ncat, perrl Neuro: awake, alert, attempts to interact but tangential thoughts CV: s1s2 irreg no m/r/g PULM:  Rhonchi bilaterally with coarse, wet upper airway sounds GI: soft, bsx4 active  Extremities: warm/dry, no edema  Skin: no rashes or lesions  Resolved Hospital Problem list     Assessment & Plan:   Ischemic stroke R ICA / MCA. S/p Rt ICA stenting and Rt MCA thrombectomy 5/7 complicated by [facility].  SAH post procedure Left Carotid Stent -care per Neurology  -continue ASA, Brilinta -defer to Neurology about resuming home anticoagulation  -follow H/H closely, benefits outweigh risks  Acute Hypoxemic Respiratory Failure  Suspected Aspiration in setting of CVA -continue unasyn for empiric aspiration coverage  -extubated 5/11 and doing ok. On 2L [place] with sats in mid 90's -speech to eval this am, suspect he will need cortrak replaced (inadvertently pulled with extubation per RN) -cont to f/u  tracheal aspirate   N/v with imaging suspected sbo:  -repeat kub without acute process -cont bowel regimen with daily docusate as pt is having bm  Atrial Fibrillation with RVR -Hx of AF on eliquis. -ICU monitoring  -hold eliquis  -defer timing of anticoagulation to Neurology  Bradycardia:  -improved off  precedex  Chronic HFrEF Dilated Cardiomyopathy LBBB  Prolonged qt LVEF 35-40%, essentially unchanged  -continue low dose metoprolol for now  Shock, multifactorial  -sedation/cardiogenic and septic -improved off sedation  Arf:  -renal u/s without hydro -monitor indices -urine studies c/w pre renal -currently slightly improved -baseline does appear to be around 1.8  Chronic normocytic anemia:  -stable  Best practice:  Diet: pending speech eval Pain/Anxiety/Delirium protocol (if indicated): n/a VAP protocol (if indicated): n/a DVT  prophylaxis: SCD GI prophylaxis: Protonix Glucose control: SSI Mobility: BR Code Status: FULL Family Communication: Per neurology Disposition: per neuro. CCM will sign off at this time. Please call with any further questions.   care time 35 mins. This represents my time independent of the NPs time taking care of the pt. This is excluding procedures.    [facility] 07/15/2019, 7:32 AM

## 2019-07-15 NOTE — Progress Notes (Signed)
  Speech Language Pathology Treatment: Dysphagia  Patient Details Name: Stephen Copeland MRN: [id number] DOB: [birth year] Today's Date: 07/15/2019 Time: 1245-8099 SLP Time Calculation (min) (ACUTE ONLY): 10 min  Assessment / Plan / Recommendation Clinical Impression  Pt is highly distractible, does not attend well to PO trials. He complains of pain and will not stop talking about a variety of topics. When offered PO he initially refuses saying he has reflux. With encouragement pt accepted an ice chip and a few sips of liquids with a variety of methods to encourage attention. Pt consistently pushed liquids back out of his mouth, not spitting, but also not making any attempt to seal lips or initiate a swallow. Through pt had congested coughing. Pt may have ability to swallow a modified texture, but he is not cooperative enough for thorough assessment at this time. Recommend ongoing NPO status with Cortrak. SLP will follow for progression.   HPI HPI: 79 y/o M admitted 5/6 with right sided CVA and remained intubated post thrombectomy and carotid stent. He was outside the TPA window. Intubated 5/6-5/7, then on 5/10 pt had multiple episodes of vomiting brown gastric contents and was reintubted. Pt was seen by SLP in ther interim, found to have acute neurogenic dysphagia with signs of aspiraiton with minimal PO trials. Now in to reassess.       SLP Plan  Continue with current plan of care       Recommendations  Diet recommendations: NPO                Oral Care Recommendations: Oral care QID;Staff/trained caregiver to provide oral care Follow up Recommendations: Skilled Nursing facility SLP Visit Diagnosis: Dysphagia, unspecified (R13.10) Plan: Continue with current plan of care       GO               [name], MA [place] Pager [phone] Office [phone]  Lynann Beaver 07/15/2019, 9:32 AM

## 2019-07-15 NOTE — Progress Notes (Signed)
STROKE TEAM PROGRESS NOTE   INTERVAL HISTORY Patient was extubated yesterday.  Is breathing well.  He however continues to have dysphagia and did not do well with speech therapist bedside swallow eval and is recommended to be n.p.o.  Core track tube will be placed later today.  He is afebrile and vital signs are stable.  CT scan from yesterday showed trace subarachnoid and intraventricular blood and anticoagulation has not yet been started.  He is on aspirin and Brilinta.  No family at the bedside.  Renal ultrasound showed no hydronephrosis.  White count has come down.  He is on Unasyn for aspiration pneumonia.  He has had a few bowel movements with trace blood but no frank diarrhea  Vitals:   07/15/19 0800 07/15/19 0900 07/15/19 0930 07/15/19 1000  BP: (!) 151/82   (!) 165/86  Pulse: 87 (!) 25 97 73  Resp: 18 19 20 16   Temp: 97.9 F (36.6 C)     TempSrc: Oral     SpO2: 95% 98% 97% 98%  Weight:      Height:        CBC:  Recent Labs  Lab 07/09/19 2031 07/09/19 2054 07/10/19 0259 07/11/19 0540 07/14/19 0442 07/14/19 1009 07/14/19 2254 07/15/19 0605  WBC 8.1   < > 11.2*   < > 10.5  --   --  8.1  NEUTROABS 5.4  --  9.4*  --   --   --   --   --   HGB 15.0   < > 12.3*   < > 11.9*   < > 10.2* 10.1*  HCT 45.8   < > 38.0*   < > 35.7*   < > 30.8* 31.1*  MCV 95.8   < > 96.9   < > 94.7  --   --  96.3  PLT 264   < > 163   < > 215  --   --  205   < > = values in this interval not displayed.    Basic Metabolic Panel:  Recent Labs  Lab 07/11/19 0540 07/11/19 1603 07/12/19 0659 07/14/19 0442 07/15/19 0605  NA 142  --    < > 140 143  K 3.5  --    < > 4.8 4.5  CL 113*  --    < > 110 111  CO2 19*  --    < > 20* 23  GLUCOSE 136*  --    < > 135* 102*  BUN 20  --    < > 55* 51*  CREATININE 1.45*  --    < > 2.06* 1.87*  CALCIUM 8.4*  --    < > 8.3* 8.7*  MG 1.8 1.8  --   --   --   PHOS 2.1* 2.4*  --   --   --    < > = values in this interval not displayed.    IMAGING past 24  hours US RENAL  Result Date: 07/15/2019 CLINICAL DATA:  Acute kidney injury. EXAM: RENAL / URINARY TRACT ULTRASOUND COMPLETE COMPARISON:  CT abdomen pelvis and renal ultrasound dated May 31, 2016. FINDINGS: Right Kidney: Renal measurements: 8.7 x 4.1 x 3.2 cm = volume: 59 mL. Increased echogenicity. No mass or hydronephrosis visualized. Left Kidney: Renal measurements: 11.6 x 4.8 x 3.4 cm = volume: 100 mL. Echogenicity is within normal limits. Round Mass-like area in the midpole corresponds to prominent renal parenchyma that is unchanged in appearance when compared to prior  studies. No hydronephrosis visualized. Bladder: Decompressed by Foley catheter. Other: None. IMPRESSION: 1. Increased echogenicity within the right kidney, consistent with underlying medical renal disease. 2. No hydronephrosis. Electronically Signed   By: Titus Dubin M.D.   On: 07/15/2019 06:58   DG Abd Portable 1V  Result Date: 07/14/2019 CLINICAL DATA:  NG tube placement. EXAM: PORTABLE ABDOMEN - 1 VIEW COMPARISON:  Single-view of the abdomen 07/14/2019 scratch the single view of the abdomen earlier today. FINDINGS: Feeding tube has been removed. NG tube is in place with the tip and side-port in the stomach. Gaseous distention of small bowel noted. IMPRESSION: NG tube in good position. Electronically Signed   By: Inge Rise M.D.   On: 07/14/2019 22:07     PHYSICAL EXAM      General -frail elderly malnourished looking Caucasian male who is not in distress Ophthalmologic - fundi not visualized due to noncooperation.  Cardiovascular - Regular rhythm and rate,   Neurological Exam : Patient is awake alert and interactive.  And follows commands well.  Speech mildly dysarthric.. Visual field full, no hemianopia. Gaze bilaterally without difficulty, PERRL. Tracking bilaterally. Left facial droop. Tongue midline. RUE 4/5 but LUE 3-/5 drift to bed within 10 second, finger grip 3/5. RLE 3/5 and LLE 2/5 proximal and 2/5 left  foot DF/PF. DTR 1+ and no babinski. Sensation symmetrical. R FTN intact but slow, L FTN not able to test due to weakness. Gait not tested.    ASSESSMENT/PLAN [name] is a 79 y.o. male with history of Afib, systolic CHF, HLD, CAD, HTN, prostate cancer presenting with left sided weakness, facial droop, dysarthria.   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 embolic secondary to known AF even on Eliquis  Code Stroke CT head evolving R MCA territory infarct (insula, cerebral cortex). hyperdense R M2 +/- M3. ASPECTS 7    CTA head & neck +LVO. Probable ruptured plaque R ICA bifurcation w/ near occlusive stenosis, possible superimposed dissection. Distal reconstitution R M1 from collaterals but w/ distal embolic occlusion proximal R M2/3 superior division branch. L ICA bifurcation and siphon atherosclerosis. B VA origins 50% stenoses. R subclavian artery origin 60% stenosis.   CT perfusion 19cc core infarct R insula and posterior frontal. 33cc penumbra.   Cerebral angio TICI3 revascularization occluded middle trifurcation R MCA branch, revascularization R ICA occlusion w/ angioplasty and stenting.   Post IR CT contrast stain R putamen and moderate R sylvian fissure, SAH + contrast.    CT head R sylvian fissure SAH. Increased contrast vs hemorrhage R suprasellar cistern following ICA concerned for increased SAH. R sulci effacement.   Carotid Doppler  R ICA stent patent  MRI  Patchy R MCA infarcts. SAH R sylvian fissure. Small IVH.   MRA - Severely motion degraded head MRA. Persistent patency of the revascularized right ICA. No flow limiting proximal stenosis identified.   CT head repeat 5/11 R insular infarct stable. Residual small SAH and small IVH.  2D Echo EF 35-40%. No source of embolus   LDL 90  HgbA1c 5.8  SCDs for VTE  prophylaxis  Eliquis (apixaban) daily prior to admission, now on aspirin 81 mg daily and Brilinta (ticagrelor) 90 mg bid. Received during IR. Continue DAPT administration in setting of SAH given new ICA stent. Continue DAPT in setting of coffee ground  emesis. Continue to hold [facility] given CT with continued SAH and IVH. Consider IV heparin 5/13  Therapy recommendations:  CIR   Disposition:  pending   Acute hypoxemic respiratory failure  Likely Aspiration PNA  Extubated w/ CCM signing off 5/8  Vomiting x 5 with possible aspiration requiring NRB 5/10. CCM reconsulted. 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 Hold AC given post IR SAH and need for DAPT post stent placement. . Consider AC once SAH resolves.  . Bradycardia resolved off sedation   Hypertension  Home meds:  Hydralazine 50 tid, metoprolol 25 bid  Resumed metoprolol 12.5 bid and Hydralazine 25 mg Q8 hrs  . 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 Secondary to stroke . NPO . Did not pass swallow eval . Speech on board . Cortrak placement  . Tube feeding on hold given intubation, vomiting coffee ground emesis (continue DAPT as above) . Again did not pass swallow eval  Suspected SBO . Tube  feeding on hold given intubation, vomiting coffee ground emesis (continue DAPT as above) . Repeat KUB w/o acute process  Bowel regimen  Other Stroke Risk Factors  Advanced age  Coronary artery disease  Chronic systolic Congestive heart failure  Other Active Problems  GERD on PPI   BPH on flomax PTA  Prostate cancer   Hypokalemia - resolved  ARF on CKD Cre 1.87. Renal US R w/ increased echogenicity. No hydro Urine NA ok. BUN. pending   LBBB. Appears old. EKG neg ischemia. Trops 27->32  (Likely strain related)  Mild leukocytosis - resolved   Mild acute blood loss anemia Hb 10.1  Hypophosphatemia - 3.2->2.1   Hospital Day #6   The patient has tolerated extubation well.  His aspiration pneumonia seems to have responded to Unasyn but he continues to have significant dysphagia and did not pass swallow eval today.  Recommend core track tube and resume prior medications.  Continue to hold anticoagulation for now and repeat CT scan of the head without contrast tomorrow and if subarachnoid intraventricular hemorrhage have resolved may consider switching to IV heparin or Eliquis if he can swallow safely.  Discussed with Dr. Audria Nine critical care medicine.  Will consider transfer to the floor later  This patient is critically ill and at significant risk of neurological worsening, death and care requires constant monitoring of vital signs, hemodynamics,respiratory and cardiac monitoring, extensive review of multiple databases, frequent neurological assessment, discussion with family, other specialists and medical decision making of high complexity.I have made any additions or clarifications directly to the above note.This critical care time does not reflect procedure time, or teaching time or supervisory time of PA/NP/Med Resident etc but could involve care discussion time. I spent 30 minutes of neurocritical care time  in the care of  this patient.  Antony Contras, MD To contact  Stroke Continuity provider, please refer to [email or web address] After hours, contact General Neurology

## 2019-07-15 NOTE — Procedures (Signed)
Cortrak  Person Inserting Tube:  Ann Bohne M, RD Tube Type:  Cortrak - 43 inches Tube Location:  Right nare Initial Placement:  Stomach Secured by: Bridle Technique Used to Measure Tube Placement:  Documented cm marking at nare/ corner of mouth Cortrak Secured At:  69 cm Procedure Comments:  Cortrak Tube Team Note:  Consult received to place a Cortrak feeding tube.   No x-ray is required. RN may begin using tube.   If the tube becomes dislodged please keep the tube and contact the Cortrak team at [email or web address] ([email or web address]) for replacement.  If after hours and replacement cannot be delayed, place a NG tube and confirm placement with an abdominal x-ray.      Stephen Sommerfeld, MS, RD, LDN, CNSC Inpatient Clinical Dietitian RD pager # available in AMION  After hours/weekend pager # available in AMION      

## 2019-07-15 NOTE — Progress Notes (Signed)
Occupational Therapy Treatment Patient Details Name: Stephen Copeland MRN: [id number] DOB: [birth year] Today's Date: 07/15/2019    History of present illness 79 y.o. male admitted on 07/09/19 for L sided weakness, facial droop, and slurred speech.  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  He did not get any tPA as he was outside of the window.  Pt intubated 5/6 for procedure and extubated in the AM of 07/10/19.  Pt with other significant PMH of sinus brady, scoliosis, prostate CA s/p surgery, HTN, dilated cardiomyopathy, CAD, CHF.  Patient re-intubated 5/10 with inability to clear secretions.. Extubated 5/11.    OT comments  Pt required Max encouragement to participate. Once sitting EOB with Max A +2, required continued redirection to task to transfer to recliner and clean up from incontinence using Stedy and Max A +2. Pt agitated at times and expressing his thoughts of participating with therapy. Wife called at end of session so pt could talk with her. Feel pt more appropriate for SNF level rehab at this time. Will continue to follow acutely and recommend OOB daily with nsg.   Follow Up Recommendations  SNF;Supervision/Assistance - 24 hour    Equipment Recommendations  Other (comment)(TBA)    Recommendations for Other Services      Precautions / Restrictions Precautions Precautions: Fall Precaution Comments: coretrack       Mobility Bed Mobility Overal bed mobility: Needs Assistance Bed Mobility: Supine to Sit     Supine to sit: Max assist;+2 for physical assistance        Transfers Overall transfer level: Needs assistance   Transfers: Sit to/from Stand Sit to Stand: Max assist;+2 physical assistance              Balance Overall balance assessment: Needs assistance Sitting-balance support: Feet supported Sitting balance-Leahy Scale: Fair Sitting balance -   Standing balance-Leahy Scale: Poor Standing balance comment: Ablet to stand  holding bar of Stedy while periarea cleaned form incontinent episode                           ADL either performed or assessed with clinical judgement   ADL   Eating/Feeding: NPO   Grooming: Moderate assistance   Upper Body Bathing: Maximal assistance;Sitting   Lower Body Bathing: Maximal assistance;+2 for physical assistance   Upper Body Dressing : Maximal assistance;Sitting   Lower Body Dressing: Total assistance   Toilet Transfer: Maximal assistance;+2 for physical assistance Toilet Transfer Details (indicate cue type and reason): simulated to recliner Toileting- Clothing Manipulation and Hygiene: Total assistance Toileting - Clothing Manipulation Details (indicate cue type and reason): incontinent BM/urine     Functional mobility during ADLs: Maximal assistance;+2 for physical assistance       Vision       Perception     Praxis      Cognition Arousal/Alertness: Awake/alert Behavior During Therapy: Agitated;Impulsive Overall Cognitive Status: Impaired/Different from baseline Area of Impairment: Orientation;Attention;Memory;Following commands;Safety/judgement;Awareness;Problem solving                 Orientation Level: Disoriented to;Time Current Attention Level: Sustained Memory: Decreased recall of precautions;Decreased short-term memory Following Commands: Follows one step commands with increased time Safety/Judgement: Decreased awareness of safety;Decreased awareness of deficits Awareness: Emergent Problem Solving: Slow processing;Decreased initiation;Difficulty sequencing;Requires verbal cues;Requires tactile cues General Comments: tangential and easily distracted; perseverating on using suction and stating he was going to call the court and needed to talk with the chief  of staff because we got him out of bed; trying to use suction for urine instead of yonker and became upset with therapist for not allowing him to use it        Exercises      Shoulder Instructions       General Comments VSS with EOB activity on 2L O2    Pertinent Vitals/ Pain       Pain Assessment: Faces Faces Pain Scale: Hurts even more Pain Location: nose; kidneys Pain Descriptors / Indicators: Grimacing;Moaning;Discomfort Pain Intervention(s): Limited activity within patient's tolerance  Home Living Family/patient expects to be discharged to:: Private residence                                        Prior Functioning/Environment              Frequency  Min 2X/week        Progress Toward Goals  OT Goals(current goals can now be found in the care plan section)  Progress towards OT goals: OT to reassess next treatment  Acute Rehab OT Goals Patient Stated Goal: to go to [facility] OT Goal Formulation: Patient unable to participate in goal setting Time For Goal Achievement: 07/24/19 Potential to Achieve Goals: Fair ADL Goals Pt Will Perform Grooming: with set-up;with supervision;sitting Pt Will Perform Lower Body Dressing: with min assist;sit to/from stand Pt Will Transfer to Toilet: stand pivot transfer;bedside commode;with mod assist Pt Will Perform Toileting - Clothing Manipulation and hygiene: with mod assist;sitting/lateral leans Additional ADL Goal #1: Pt will sustain attention to simple ADL with Min cues  Plan Discharge plan needs to be updated    Co-evaluation                 AM-PAC OT "6 Clicks" Daily Activity     Outcome Measure   Help from another person eating meals?: Total Help from another person taking care of personal grooming?: A Lot Help from another person toileting, which includes using toliet, bedpan, or urinal?: Total Help from another person bathing (including washing, rinsing, drying)?: A Lot Help from another person to put on and taking off regular upper body clothing?: Total Help from another person to put on and taking off regular lower body clothing?: Total 6 Click Score: 8     End of Session Equipment Utilized During Treatment: Gait belt;Oxygen(2L)  OT Visit Diagnosis: Unsteadiness on feet (R26.81);Other abnormalities of gait and mobility (R26.89);Muscle weakness (generalized) (M62.81);Pain;Other symptoms and signs involving cognitive function Pain - part of body: ("kidneys"q)   Activity Tolerance Patient tolerated treatment well   Patient Left in chair;with call bell/phone within reach;with chair alarm set   Nurse Communication Mobility status;Need for lift equipment        Time: 9201-0071 OT Time Calculation (min): 52 min  Charges: OT General Charges $OT Visit: 1 Visit OT Treatments $Self Care/Home Management : 38-52 mins  Maurie Boettcher, OT/L   Acute OT Clinical Specialist [place] Pager [phone] Office [phone]    [facility] 07/15/2019, 5:36 PM

## 2019-07-15 NOTE — Progress Notes (Signed)
Arrived from 4N. Alert and not very cooperative with answering orientation questions at time. Talk with wife at this time. Cal light within reach.

## 2019-07-16 ENCOUNTER — Inpatient Hospital Stay (HOSPITAL_COMMUNITY): Payer: Medicare Other

## 2019-07-16 LAB — GLUCOSE, CAPILLARY
Glucose-Capillary: 121 mg/dL — ABNORMAL HIGH (ref 70–99)
Glucose-Capillary: 139 mg/dL — ABNORMAL HIGH (ref 70–99)
Glucose-Capillary: 116 mg/dL — ABNORMAL HIGH (ref 70–99)
Glucose-Capillary: 127 mg/dL — ABNORMAL HIGH (ref 70–99)
Glucose-Capillary: 119 mg/dL — ABNORMAL HIGH (ref 70–99)

## 2019-07-16 LAB — HEMOGLOBIN AND HEMATOCRIT, BLOOD
HCT: 30.6 % — ABNORMAL LOW (ref 39.0–52.0)
HCT: 30.4 % — ABNORMAL LOW (ref 39.0–52.0)
Hemoglobin: 10 g/dL — ABNORMAL LOW (ref 13.0–17.0)
Hemoglobin: 9.9 g/dL — ABNORMAL LOW (ref 13.0–17.0)

## 2019-07-16 MED ORDER — METOPROLOL TARTRATE 25 MG/10 ML ORAL SUSPENSION
12.5000 mg | Freq: Two times a day (BID) | ORAL | Status: DC
  Administered 2019-07-16 – 2019-07-28 (×25): 12.5 mg
  Filled 2019-07-16 (×25): qty 5

## 2019-07-16 NOTE — Progress Notes (Signed)
Nutrition Follow-up  DOCUMENTATION CODES:   Not applicable  INTERVENTION:   Osmolite 1.5 @ 60 ml/hr via Cortrak tube 30 ml Prostat daily  Provides: 2260 kcal, 105 grams protein, and 1094 ml free water.    NUTRITION DIAGNOSIS:   Inadequate oral intake related to inability to eat as evidenced by NPO status. Ongoing.   GOAL:   Patient will meet greater than or equal to 90% of their needs Meeting with TF  MONITOR:   TF tolerance, Labs  REASON FOR ASSESSMENT:   Consult Enteral/tube feeding initiation and management  ASSESSMENT:   Pt with PMH of CHF, CAD, cardiomyopathy, hiatal hernia, HLD, and HTN now admitted with R CVA s/p thrombectomy and carotid stent.   Pt discussed with RN.  Therapy recommending SNF placement   5/6 admitted s/p IR, remained intubated post-op 5/7 extubated; cortrak placed  5/10 N/V with normal KUB; intubated 5/11 extubated 5/12 failed swallow eval; cortrak replaced as tube was inadvertently pulled with extubation  Medications reviewed and include: colace, SSI, miralax   Labs reviewed: CBG's: [phone]    Diet Order:   Diet Order            Diet NPO time specified  Diet effective now              EDUCATION NEEDS:   No education needs have been identified at this time  Skin:  Skin Assessment: Reviewed RN Assessment  Last BM:  5/12  Height:   Ht Readings from Last 1 Encounters:  07/15/19 6\' 2"  (1.88 m)    Weight:   Wt Readings from Last 1 Encounters:  07/15/19 85.2 kg    Ideal Body Weight:  80.9 kg  BMI:  Body mass index is 24.12 kg/m.  Estimated Nutritional Needs:   Kcal:  2100-2300  Protein:  100-115 grams  Fluid:  >2 L/day  Lockie Pares., RD, LDN, CNSC See AMiON for contact information

## 2019-07-16 NOTE — Progress Notes (Signed)
  Speech Language Pathology Treatment: Dysphagia;Cognitive-Linquistic  Patient Details Name: Stephen Copeland MRN: [id number] DOB: Aug 06, 1940 Today's Date: 07/16/2019 Time: 2725-3664 SLP Time Calculation (min) (ACUTE ONLY): 18 min  Assessment / Plan / Recommendation Clinical Impression  Pts cognition somewhat improved today, with verbal cues pt was better able to follow one step directions and participate in turn taking during verbal tasks. He continues to be tangential and inappropriate at times. He also has absent emergent awareness of deficits. Does not recognize severity of dysphagia or dysarthria though improves with frequent verbal cues.  Pt has anterior loss with all PO trials, and there is severe oral residue with pudding even with a high level of assist with feeding and positioning from SLP. Pt has immediate weak coughing with thin liquids, but high risk of delayed aspiraiton with thick texures as well. Will proceed with instrumental assessment of swallowing to assist in plan of care. MBS tomorrow.    HPI    79 y/o M admitted 5/6 with right sided CVA and remained intubated post thrombectomy and carotid stent. He was outside the TPA window. Intubated 5/6-5/7, then on 5/10 pt had multiple episodes of vomiting brown gastric contents and was reintubted. Pt was seen by SLP in ther interim, found to have acute neurogenic dysphagia with signs of aspiraiton with minimal PO trials. Now in to reassess.       SLP Plan  Continue with current plan of care       Recommendations  Diet recommendations: NPO                Follow up Recommendations: Skilled Nursing facility SLP Visit Diagnosis: Dysphagia, unspecified (R13.10) Plan: Continue with current plan of care       GO               [name], MA [place] Pager [phone] Office [phone]  Lynann Beaver 07/16/2019, 1:16 PM

## 2019-07-16 NOTE — Progress Notes (Signed)
STROKE TEAM PROGRESS NOTE   INTERVAL HISTORY Patient remains neurologically stable.  He has had no further coffee-ground vomitus.  Hematocrit and hemoglobin also stable.  Repeat CT scan of the head today shows an change minimum subarachnoid hemorrhage with layering of occipital horns.  Vitals:   07/15/19 2150 07/15/19 2322 07/16/19 0325 07/16/19 0749  BP: (!) 151/70 (!) 164/69 (!) 145/66 (!) 153/65  Pulse: 72 63 66 63  Resp: 18 18 18 17   Temp: 99 F (37.2 C) 99.1 F (37.3 C) 99 F (37.2 C) 98.8 F (37.1 C)  TempSrc: Axillary Axillary Axillary Axillary  SpO2: 97% 94% 96% 98%  Weight: 85.2 kg     Height: 6\' 2"  (1.88 m)       CBC:  Recent Labs  Lab 07/09/19 2031 07/09/19 2054 07/10/19 0259 07/11/19 0540 07/14/19 0442 07/14/19 1009 07/15/19 0605 07/15/19 1053 07/16/19 0414 07/16/19 1022  WBC 8.1   < > 11.2*   < > 10.5  --  8.1  --   --   --   NEUTROABS 5.4  --  9.4*  --   --   --   --   --   --   --   HGB 15.0   < > 12.3*   < > 11.9*   < > 10.1*   < > 10.0* 9.9*  HCT 45.8   < > 38.0*   < > 35.7*   < > 31.1*   < > 30.6* 30.4*  MCV 95.8   < > 96.9   < > 94.7  --  96.3  --   --   --   PLT 264   < > 163   < > 215  --  205  --   --   --    < > = values in this interval not displayed.    Basic Metabolic Panel:  Recent Labs  Lab 07/11/19 0540 07/11/19 1603 07/12/19 0659 07/14/19 0442 07/15/19 0605  NA 142  --    < > 140 143  K 3.5  --    < > 4.8 4.5  CL 113*  --    < > 110 111  CO2 19*  --    < > 20* 23  GLUCOSE 136*  --    < > 135* 102*  BUN 20  --    < > 55* 51*  CREATININE 1.45*  --    < > 2.06* 1.87*  CALCIUM 8.4*  --    < > 8.3* 8.7*  MG 1.8 1.8  --   --   --   PHOS 2.1* 2.4*  --   --   --    < > = values in this interval not displayed.    IMAGING past 24 hours CT HEAD WO CONTRAST  Result Date: 07/16/2019 CLINICAL DATA:  Intracranial hemorrhage, follow-up EXAM: CT HEAD WITHOUT CONTRAST TECHNIQUE: Contiguous axial images were obtained from the base of the  skull through the vertex without intravenous contrast. COMPARISON:  07/14/2019 FINDINGS: Brain: More well-defined area of hypoattenuation involving the right frontoparietal lobes and insula reflecting evolving infarction. There is unchanged minimal subarachnoid hemorrhage along the right sylvian fissure and postcentral sulcus. Minimal layering hemorrhage within the occipital horns is also again noted. No significant mass effect. No new hemorrhage or loss of gray-white differentiation. Ventricles are stable in size. Stable findings of chronic microvascular ischemic changes. Vascular: No new finding. Skull: Calvarium is unremarkable. Sinuses/Orbits: No acute finding. Other:  None. IMPRESSION: Evolving recent right MCA territory infarction. Unchanged minimal residual subarachnoid hemorrhage. Electronically Signed   By: Macy Mis M.D.   On: 07/16/2019 13:25     PHYSICAL EXAM      General -frail elderly malnourished looking Caucasian male who is not in distress Ophthalmologic - fundi not visualized due to noncooperation.  Cardiovascular - Regular rhythm and rate,   Neurological Exam : Patient is awake alert and interactive.  And follows commands well.  Speech mildly dysarthric.. Visual field full, no hemianopia. Gaze bilaterally without difficulty, PERRL. Tracking bilaterally. Left facial droop. Tongue midline. RUE 4/5 but LUE 3-/5 drift to bed within 10 second, finger grip 3/5. RLE 3/5 and LLE 2/5 proximal and 2/5 left foot DF/PF. DTR 1+ and no babinski. Sensation symmetrical. R FTN intact but slow, L FTN not able to test due to weakness. Gait not tested.    ASSESSMENT/PLAN Mr. Maddex Garlitz is a 79 y.o. male with history of Afib, systolic CHF, HLD, CAD, HTN, prostate cancer presenting with left sided weakness, facial droop, dysarthria.   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 embolic secondary to known AF even on Eliquis  Code Stroke CT head evolving R MCA territory infarct (insula, cerebral cortex). hyperdense R M2 +/- M3. ASPECTS 7    CTA head & neck +LVO. Probable ruptured plaque R ICA bifurcation w/ near occlusive stenosis, possible superimposed dissection. Distal reconstitution R M1 from collaterals but w/ distal embolic occlusion proximal R M2/3 superior division branch. L ICA bifurcation and siphon atherosclerosis. B VA origins 50% stenoses. R subclavian artery origin 60% stenosis.   CT perfusion 19cc core infarct R insula and posterior frontal. 33cc penumbra.   Cerebral angio TICI3 revascularization occluded middle trifurcation R MCA branch, revascularization R ICA occlusion w/ angioplasty and stenting.   Post IR CT contrast stain R putamen and moderate R sylvian fissure, SAH + contrast.    CT head R sylvian fissure SAH. Increased contrast vs hemorrhage R suprasellar cistern following ICA concerned for increased SAH. R sulci effacement.   Carotid Doppler  R ICA stent patent  MRI  Patchy R MCA infarcts. SAH R sylvian fissure. Small IVH.   MRA - Severely motion degraded head MRA. Persistent patency of the revascularized right ICA. No flow limiting proximal stenosis identified.   CT head repeat 5/11 R insular infarct stable. Residual small SAH and small IVH.  CT head repeat 5/13 evolving R MCA infarct, SAH unchanged   2D Echo EF 35-40%. No source of embolus   LDL 90  HgbA1c 5.8  SCDs for VTE prophylaxis  Eliquis (apixaban) daily prior to admission, now on aspirin 81 mg daily and Brilinta (ticagrelor) 90 mg bid. Received during IR. Continue DAPT administration in setting of SAH given new ICA stent. Continue DAPT in setting of coffee ground emesis. Continue to hold [facility] given CT with continued SAH and IVH. Consider IV heparin 5/13  Therapy recommendations:  CIR - > pt w/o family support at home. Will need SNF  Disposition:   pending   Acute hypoxemic respiratory failure  Likely Aspiration PNA  Extubated w/ CCM signing off 5/8  Vomiting x 5 with possible aspiration requiring NRB 5/10. CCM reconsulted. 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 Hold AC given post IR SAH and need for DAPT post stent placement. . Bradycardia resolved off sedation . Consider AC once SAH resolves.   Hypertension  Home meds:  Hydralazine 50 tid, metoprolol 25 bid  on Hydralazine 25 mg Q8 hrs  Resume metoprolol 12.5 bid . BP 140-160s . 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 Secondary to stroke . NPO . Again, did not pass swallow eval . Cortrak replacement, on Tube feeding  . Speech on board  Suspected SBO . Repeat KUB w/o acute process  Bowel regimen . Replaced cortrak, on Tube feeding   Other Stroke Risk Factors  Advanced age  Coronary artery disease  Chronic systolic Congestive heart failure  Other Active Problems  GERD on PPI   BPH on flomax PTA  Prostate cancer   Hypokalemia - resolved  ARF on CKD Cre 1.87. Renal US R w/ increased echogenicity. No hydro Urine NA ok. UN normal  LBBB. Appears old. EKG neg ischemia. Trops 27->32  (Likely strain related)  Mild  leukocytosis - resolved  Mild acute blood loss anemia Hb 9.9  Hypophosphatemia - 3.2->2.1   Hospital Day #7  Continue aspirin and Brilinta but hold off on anticoagulation since repeat CT scan today on day #8 still shows some residual subarachnoid and intraventricular hemorrhage.  Continue ongoing therapies.  Await transfer to skilled nursing facility when bed available.  Discussed with rehab coordinator and social worker greater than 50% time during this 25-minute visit was spent on counseling coordination of care and discussion with care team  Antony Contras, MD  To contact Stroke Continuity provider, please refer to [email or web address] After hours, contact General Neurology

## 2019-07-16 NOTE — Progress Notes (Signed)
Inpatient Rehabilitation Admissions Coordinator  Patient will need SNF level rehab for prolonged recovery as he has limited caregiver support. I have alerted TOC team, Vida Roller RN CM and  Brandt SW. We will sign off at this time.  Danne Baxter, RN, MSN Rehab Admissions Coordinator [phone] 07/16/2019 12:39 PM

## 2019-07-17 ENCOUNTER — Inpatient Hospital Stay (HOSPITAL_COMMUNITY): Payer: Medicare Other

## 2019-07-17 LAB — BASIC METABOLIC PANEL
Anion gap: 8 (ref 5–15)
BUN: 32 mg/dL — ABNORMAL HIGH (ref 8–23)
CO2: 24 mmol/L (ref 22–32)
Calcium: 8.5 mg/dL — ABNORMAL LOW (ref 8.9–10.3)
Chloride: 110 mmol/L (ref 98–111)
Creatinine, Ser: 1.45 mg/dL — ABNORMAL HIGH (ref 0.61–1.24)
GFR calc Af Amer: 53 mL/min — ABNORMAL LOW (ref >60)
GFR calc non Af Amer: 46 mL/min — ABNORMAL LOW (ref >60)
Glucose, Bld: 124 mg/dL — ABNORMAL HIGH (ref 70–99)
Potassium: 4.3 mmol/L (ref 3.5–5.1)
Sodium: 142 mmol/L (ref 135–145)

## 2019-07-17 LAB — GLUCOSE, CAPILLARY
Glucose-Capillary: 115 mg/dL — ABNORMAL HIGH (ref 70–99)
Glucose-Capillary: 120 mg/dL — ABNORMAL HIGH (ref 70–99)
Glucose-Capillary: 121 mg/dL — ABNORMAL HIGH (ref 70–99)
Glucose-Capillary: 122 mg/dL — ABNORMAL HIGH (ref 70–99)
Glucose-Capillary: 127 mg/dL — ABNORMAL HIGH (ref 70–99)
Glucose-Capillary: 137 mg/dL — ABNORMAL HIGH (ref 70–99)
Glucose-Capillary: 148 mg/dL — ABNORMAL HIGH (ref 70–99)

## 2019-07-17 LAB — CBC
HCT: 32.5 % — ABNORMAL LOW (ref 39.0–52.0)
Hemoglobin: 10.7 g/dL — ABNORMAL LOW (ref 13.0–17.0)
MCH: 31.7 pg (ref 26.0–34.0)
MCHC: 32.9 g/dL (ref 30.0–36.0)
MCV: 96.2 fL (ref 80.0–100.0)
Platelets: 265 10*3/uL (ref 150–400)
RBC: 3.38 MIL/uL — ABNORMAL LOW (ref 4.22–5.81)
RDW: 13.8 % (ref 11.5–15.5)
WBC: 7.7 10*3/uL (ref 4.0–10.5)
nRBC: 0 % (ref 0.0–0.2)

## 2019-07-17 MED ORDER — LIDOCAINE 5 % EX OINT
1.0000 "application " | TOPICAL_OINTMENT | Freq: Two times a day (BID) | CUTANEOUS | Status: DC | PRN
  Administered 2019-07-17 – 2019-08-26 (×8): 1 via TOPICAL
  Filled 2019-07-17 (×2): qty 35.44

## 2019-07-17 NOTE — Social Work (Addendum)
Patient currently disoriented to situation. CSW attempted to contact patient's wife Delia at [phone], number not in service. CSW called [phone] and left a voicemail.   [employer], LCSWA

## 2019-07-17 NOTE — Progress Notes (Signed)
SLP Cancellation Note  Patient Details Name: Stephen Copeland MRN: [id number] DOB: 1941/02/15   Cancelled treatment:        Attempted MBS, pt refused to go with transporter. Will defer f/u to next week.    Makenize Messman, Katherene Ponto 07/17/2019, 12:27 PM

## 2019-07-17 NOTE — Progress Notes (Signed)
OT Cancellation Note  Patient Details Name: Hilman Kissling MRN: [id number] DOB: [birth year]   Cancelled Treatment:    Reason Eval/Treat Not Completed: Patient declined, no reason specified When announced that therapy was there to work with pt, pt stated "That's an emphatic NO, I know who you are and all about physical therapy, Im not participating until Im back at [facility]". Will attempt to see as able.   Corinne Ports E. Zoi Devine, COTA/L Acute Rehabilitation Services [place] 07/17/2019, 11:17 AM

## 2019-07-17 NOTE — Progress Notes (Signed)
PT Cancellation Note  Patient Details Name: Stephen Copeland MRN: [id number] DOB: 1941-02-15   Cancelled Treatment:    Reason Eval/Treat Not Completed: Patient declined, no reason specified Attempted to see pt for PT treatment. Despite max encouragement pt adamantly refused stating  "That's an emphatic NO, I know who you are and all about physical therapy, Im not participating until Im back at [facility]".  Earney Navy, PTA Acute Rehabilitation Services Pager: [phone] Office: [phone]   07/17/2019, 11:54 AM

## 2019-07-17 NOTE — Progress Notes (Signed)
STROKE TEAM PROGRESS NOTE   INTERVAL HISTORY Patient is sitting up in bed.  He is neurologically stable.  No more vomiting.  Hematocrit is stable at 32.5.  Serum creatinine is 1.45.  White count is normal at 7.7.  Patient is scheduled for modified barium swallow today.  Vital signs stable.  No neuro changes  Vitals:   07/17/19 0007 07/17/19 0351 07/17/19 0742 07/17/19 0930  BP: (!) 150/68 (!) 158/71 (!) 160/63 (!) 157/102  Pulse: (!) 58 (!) 56 64 67  Resp: 18 19 20    Temp: 98.6 F (37 C) 98.5 F (36.9 C) 98.1 F (36.7 C)   TempSrc:  Oral Oral   SpO2: 97% 94% 99%   Weight:      Height:        CBC:  Recent Labs  Lab 07/15/19 0605 07/15/19 1053 07/16/19 1022 07/17/19 0336  WBC 8.1  --   --  7.7  HGB 10.1*   < > 9.9* 10.7*  HCT 31.1*   < > 30.4* 32.5*  MCV 96.3  --   --  96.2  PLT 205  --   --  265   < > = values in this interval not displayed.    Basic Metabolic Panel:  Recent Labs  Lab 07/11/19 0540 07/11/19 1603 07/12/19 0659 07/15/19 0605 07/17/19 0336  NA 142  --    < > 143 142  K 3.5  --    < > 4.5 4.3  CL 113*  --    < > 111 110  CO2 19*  --    < > 23 24  GLUCOSE 136*  --    < > 102* 124*  BUN 20  --    < > 51* 32*  CREATININE 1.45*  --    < > 1.87* 1.45*  CALCIUM 8.4*  --    < > 8.7* 8.5*  MG 1.8 1.8  --   --   --   PHOS 2.1* 2.4*  --   --   --    < > = values in this interval not displayed.    IMAGING past 24 hours CT HEAD WO CONTRAST  Result Date: 07/16/2019 CLINICAL DATA:  Intracranial hemorrhage, follow-up EXAM: CT HEAD WITHOUT CONTRAST TECHNIQUE: Contiguous axial images were obtained from the base of the skull through the vertex without intravenous contrast. COMPARISON:  07/14/2019 FINDINGS: Brain: More well-defined area of hypoattenuation involving the right frontoparietal lobes and insula reflecting evolving infarction. There is unchanged minimal subarachnoid hemorrhage along the right sylvian fissure and postcentral sulcus. Minimal layering  hemorrhage within the occipital horns is also again noted. No significant mass effect. No new hemorrhage or loss of gray-white differentiation. Ventricles are stable in size. Stable findings of chronic microvascular ischemic changes. Vascular: No new finding. Skull: Calvarium is unremarkable. Sinuses/Orbits: No acute finding. Other: None. IMPRESSION: Evolving recent right MCA territory infarction. Unchanged minimal residual subarachnoid hemorrhage. Electronically Signed   By: Macy Mis M.D.   On: 07/16/2019 13:25     PHYSICAL EXAM     General -frail elderly malnourished looking Caucasian male who is not in distress Ophthalmologic - fundi not visualized due to noncooperation.  Cardiovascular - Regular rhythm and rate,   Neurological Exam : Patient is awake alert and interactive.  And follows commands well.  Speech mildly dysarthric.. Visual field full, no hemianopia. Gaze bilaterally without difficulty, PERRL. Tracking bilaterally. Left facial droop. Tongue midline. RUE 4/5 but LUE 4-/5 drift to bed within  10 second, finger grip 3/5. RLE 4/5 and LLE 3/5 proximal and 2/5 left foot DF/PF. DTR 1+ and no babinski. Sensation symmetrical. R FTN intact but slow, L FTN not able to test due to weakness. Gait not tested.    ASSESSMENT/PLAN Mr. Zac Torti is a 79 y.o. male with history of Afib, systolic CHF, HLD, CAD, HTN, prostate cancer presenting with left sided weakness, facial droop, dysarthria.   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 embolic secondary to known AF even on Eliquis  Code Stroke CT head evolving R MCA territory infarct (insula, cerebral cortex). hyperdense R M2 +/- M3. ASPECTS 7    CTA head & neck +LVO. Probable ruptured plaque R ICA bifurcation w/ near occlusive stenosis, possible superimposed dissection. Distal reconstitution R M1  from collaterals but w/ distal embolic occlusion proximal R M2/3 superior division branch. L ICA bifurcation and siphon atherosclerosis. B VA origins 50% stenoses. R subclavian artery origin 60% stenosis.   CT perfusion 19cc core infarct R insula and posterior frontal. 33cc penumbra.   Cerebral angio TICI3 revascularization occluded middle trifurcation R MCA branch, revascularization R ICA occlusion w/ angioplasty and stenting.   Post IR CT contrast stain R putamen and moderate R sylvian fissure, SAH + contrast.    CT head R sylvian fissure SAH. Increased contrast vs hemorrhage R suprasellar cistern following ICA concerned for increased SAH. R sulci effacement.   Carotid Doppler  R ICA stent patent  MRI  Patchy R MCA infarcts. SAH R sylvian fissure. Small IVH.   MRA - Severely motion degraded head MRA. Persistent patency of the revascularized right ICA. No flow limiting proximal stenosis identified.   CT head repeat 5/11 R insular infarct stable. Residual small SAH and small IVH.  CT head repeat 5/13 evolving R MCA infarct, SAH unchanged   2D Echo EF 35-40%. No source of embolus   LDL 90  HgbA1c 5.8  SCDs for VTE prophylaxis  Eliquis (apixaban) daily prior to admission, now on aspirin 81 mg daily and Brilinta (ticagrelor) 90 mg bid. Received during IR. Continue DAPT administration in setting of SAH given new ICA stent. Continue DAPT in setting of coffee ground emesis. Continue to hold [facility] given CT with continued SAH and IVH. Consider IV heparin 5/13  Therapy recommendations:  CIR - > pt w/o family support at home. Will need SNF  Disposition:  pending   Acute hypoxemic respiratory failure, resolved Likely Aspiration PNA  Extubated w/ CCM signing off 5/8  Vomiting x 5 with possible aspiration requiring NRB 5/10. CCM reconsulted. 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 Hold AC given post IR SAH and need for DAPT post stent placement. . Bradycardia resolved off sedation . Consider AC once SAH resolves.   Hypertension  Home meds:  Hydralazine 50 tid, metoprolol 25 bid  on Hydralazine 25 mg Q8 hrs and metoprolol  12.5 bid . BP 160-180s . 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 Secondary to stroke . NPO . Again, did not pass swallow eval . Cortrak replacement, on Tube feeding  . Speech on board . For MBSS today  Suspected SBO . Repeat KUB w/o acute process  Bowel regimen . Replaced cortrak, on Tube feeding   Other Stroke Risk Factors  Advanced age  Coronary artery disease  Chronic systolic Congestive heart failure  Other Active Problems  GERD on PPI   BPH on flomax PTA  Prostate cancer   Hypokalemia - resolved  ARF on CKD Cre 1.45. Renal US R w/ increased echogenicity. No hydro Urine NA ok. UN normal  LBBB. Appears old. EKG neg ischemia. Trops 27->32  (Likely strain related)  Mild leukocytosis - resolved  Mild acute blood loss anemia Hb 10.7  Hypophosphatemia - 3.2->2.1   Leg and back pain. Uses xylocaine cream at home. Resumed in hospital  Hospital Day #8  Continue ongoing therapies and mobilize out of bed.  Speech therapy to do modified barium swallow today.  Continue ongoing  care.  Discussed with speech therapist and patient's RN.Anticipate  Transfer  to rehab when able to swallow early next week.  Greater than 50% time during this 25-minute visit was spent on counseling and coordination of care and discussion with care team.  Antony Contras, MD  To contact Stroke Continuity provider, please refer to [email or web address] After hours, contact General Neurology

## 2019-07-18 DIAGNOSIS — N1831 Chronic kidney disease, stage 3a: Secondary | ICD-10-CM

## 2019-07-18 LAB — BASIC METABOLIC PANEL
Anion gap: 8 (ref 5–15)
BUN: 30 mg/dL — ABNORMAL HIGH (ref 8–23)
CO2: 25 mmol/L (ref 22–32)
Calcium: 8.8 mg/dL — ABNORMAL LOW (ref 8.9–10.3)
Chloride: 106 mmol/L (ref 98–111)
Creatinine, Ser: 1.45 mg/dL — ABNORMAL HIGH (ref 0.61–1.24)
GFR calc Af Amer: 53 mL/min — ABNORMAL LOW (ref >60)
GFR calc non Af Amer: 46 mL/min — ABNORMAL LOW (ref >60)
Glucose, Bld: 123 mg/dL — ABNORMAL HIGH (ref 70–99)
Potassium: 4.8 mmol/L (ref 3.5–5.1)
Sodium: 139 mmol/L (ref 135–145)

## 2019-07-18 LAB — CBC
HCT: 36.1 % — ABNORMAL LOW (ref 39.0–52.0)
Hemoglobin: 11.7 g/dL — ABNORMAL LOW (ref 13.0–17.0)
MCH: 31.2 pg (ref 26.0–34.0)
MCHC: 32.4 g/dL (ref 30.0–36.0)
MCV: 96.3 fL (ref 80.0–100.0)
Platelets: 314 10*3/uL (ref 150–400)
RBC: 3.75 MIL/uL — ABNORMAL LOW (ref 4.22–5.81)
RDW: 13.8 % (ref 11.5–15.5)
WBC: 12.3 10*3/uL — ABNORMAL HIGH (ref 4.0–10.5)
nRBC: 0 % (ref 0.0–0.2)

## 2019-07-18 LAB — GLUCOSE, CAPILLARY
Glucose-Capillary: 126 mg/dL — ABNORMAL HIGH (ref 70–99)
Glucose-Capillary: 123 mg/dL — ABNORMAL HIGH (ref 70–99)
Glucose-Capillary: 124 mg/dL — ABNORMAL HIGH (ref 70–99)
Glucose-Capillary: 103 mg/dL — ABNORMAL HIGH (ref 70–99)
Glucose-Capillary: 127 mg/dL — ABNORMAL HIGH (ref 70–99)

## 2019-07-18 MED ORDER — ATORVASTATIN CALCIUM 40 MG PO TABS
40.0000 mg | ORAL_TABLET | Freq: Every day | ORAL | Status: DC
  Administered 2019-07-18 – 2019-08-02 (×13): 40 mg via ORAL
  Filled 2019-07-18 (×15): qty 1

## 2019-07-18 NOTE — Progress Notes (Signed)
STROKE TEAM PROGRESS NOTE   INTERVAL HISTORY No family is at bedside. Pt is lying in bed, awake alert, had several bowel movement with tube feeding. Otherwise, neuro unchanged.   Vitals:   07/18/19 0839 07/18/19 1213 07/18/19 1417 07/18/19 1643  BP: (!) 141/80 136/68 132/81 (!) 142/73  Pulse: 70 (!) 58 65 71  Resp: 16 16  16   Temp: 98.2 F (36.8 C) 98.2 F (36.8 C)  97.7 F (36.5 C)  TempSrc:      SpO2: 98% 98%  97%  Weight:      Height:        CBC:  Recent Labs  Lab 07/17/19 0336 07/18/19 0321  WBC 7.7 12.3*  HGB 10.7* 11.7*  HCT 32.5* 36.1*  MCV 96.2 96.3  PLT 265 696    Basic Metabolic Panel:  Recent Labs  Lab 07/17/19 0336 07/18/19 0321  NA 142 139  K 4.3 4.8  CL 110 106  CO2 24 25  GLUCOSE 124* 123*  BUN 32* 30*  CREATININE 1.45* 1.45*  CALCIUM 8.5* 8.8*    IMAGING past 24 hours No results found.   PHYSICAL EXAM  General -frail elderly malnourished looking Caucasian male who is not in distress  Ophthalmologic - fundi not visualized due to noncooperation.  Cardiovascular - Regular rhythm and rate,   Neurological Exam - Patient is awake alert and interactive.  And follows commands well.  Speech mildly dysarthric. Visual field full, no hemianopia. Gaze bilaterally without difficulty, PERRL. Tracking bilaterally. Left mild facial droop. Tongue midline. BUE 4/5. BLE 3-/5 proximal and 2/5 left foot DF/PF. DTR 1+ and no babinski. Sensation symmetrical. B FTN intact but slow. Gait not tested.    ASSESSMENT/PLAN [name] is a 79 y.o. male with history of Afib, systolic CHF, HLD, CAD, HTN, prostate cancer presenting with left sided weakness, facial droop, dysarthria.   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 embolic secondary to known AF even on Eliquis  Code Stroke CT head evolving R MCA territory  infarct (insula, cerebral cortex). hyperdense R M2 +/- M3. ASPECTS 7    CTA head & neck +LVO. Probable ruptured plaque R ICA bifurcation w/ near occlusive stenosis, possible superimposed dissection. Distal reconstitution R M1 from collaterals but w/ distal embolic occlusion proximal R M2/3 superior division branch. L ICA bifurcation and siphon atherosclerosis. B VA origins 50% stenoses. R subclavian artery origin 60% stenosis.   CT perfusion 19cc core infarct R insula and posterior frontal. 33cc penumbra.   Cerebral angio TICI3 revascularization occluded middle trifurcation R MCA branch, revascularization R ICA occlusion w/ angioplasty and stenting.   Post IR CT contrast stain R putamen and moderate R sylvian fissure, SAH + contrast.    CT head R sylvian fissure SAH. Increased contrast vs hemorrhage R suprasellar cistern following ICA concerned for increased SAH. R sulci effacement.   Carotid Doppler  R ICA stent patent  MRI  Patchy R MCA infarcts. SAH R sylvian fissure. Small IVH.   MRA - Severely motion degraded head MRA. Persistent patency of the revascularized right ICA. No flow limiting proximal stenosis identified.   CT head repeat 5/11 R insular infarct stable. Residual small SAH and small IVH.  CT head repeat 5/13 evolving R MCA infarct, SAH unchanged   2D Echo EF 35-40%. No source of embolus  LDL 90  HgbA1c 5.8  SCDs for VTE prophylaxis  Eliquis (apixaban) daily prior to admission, now on aspirin 81 mg daily and Brilinta (ticagrelor) 90 mg bid. Continue to hold [facility] given CT with continued SAH and IVH.   Therapy recommendations:  CIR - > pt w/o family support at home. Will need SNF  Disposition:  pending   Acute hypoxemic respiratory failure, resolved Likely Aspiration PNA  Extubated w/ CCM signing off 5/8  Vomiting x 5 with possible aspiration requiring NRB 5/10. CCM reconsulted. 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 Hold AC given post IR SAH and need for DAPT post stent placement. . Bradycardia resolved off sedation . Consider AC once SAH resolves.   Hypertension  Home meds:  Hydralazine 50 tid, metoprolol 25 bid  On Hydralazine 25 mg Q8 and metoprolol 12.5 bid . SBP 130's -140's . Long-term BP goal normotensive  Hyperlipidemia  Home meds:  No statin  LDL 90, goal < 70  May consider statin again ->pt adamantly has refused statins in the past  ARF on CKD  Cre 2.06->1.87-> 1.45->1.45   Renal US R w/ increased echogenicity. 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 Secondary to stroke . NPO . not pass swallow eval . Cortrak replacement, on TF@ 60  . Speech on board  Other Stroke Risk Factors  Advanced age  Coronary artery disease  Chronic systolic Congestive heart failure  Other Active Problems  GERD on PPI   BPH on flomax PTA  Prostate cancer   Hypokalemia - resolved  LBBB. Appears old. EKG neg ischemia. Trops 27->32  (Likely strain related)  Mild leukocytosis WBC 7.7->12.3 (afebrile)  Diarrhea - likely due to TF - Flexiseal if needed  Hospital Day #9    Stephen Hawking, MD PhD Stroke Neurology 07/18/2019 6:10 PM   To contact Stroke Continuity provider, please refer to [email or web address] After hours, contact General Neurology

## 2019-07-19 ENCOUNTER — Inpatient Hospital Stay (HOSPITAL_COMMUNITY): Payer: Medicare Other

## 2019-07-19 DIAGNOSIS — D72829 Elevated white blood cell count, unspecified: Secondary | ICD-10-CM

## 2019-07-19 LAB — URINALYSIS, COMPLETE (UACMP) WITH MICROSCOPIC
Bacteria, UA: NONE SEEN
Bilirubin Urine: NEGATIVE
Glucose, UA: NEGATIVE mg/dL
Hgb urine dipstick: NEGATIVE
Ketones, ur: NEGATIVE mg/dL
Leukocytes,Ua: NEGATIVE
Nitrite: NEGATIVE
Protein, ur: NEGATIVE mg/dL
Specific Gravity, Urine: 1.021 (ref 1.005–1.030)
pH: 5 (ref 5.0–8.0)

## 2019-07-19 LAB — CBC
HCT: 38.5 % — ABNORMAL LOW (ref 39.0–52.0)
Hemoglobin: 12.4 g/dL — ABNORMAL LOW (ref 13.0–17.0)
MCH: 31 pg (ref 26.0–34.0)
MCHC: 32.2 g/dL (ref 30.0–36.0)
MCV: 96.3 fL (ref 80.0–100.0)
Platelets: 359 10*3/uL (ref 150–400)
RBC: 4 MIL/uL — ABNORMAL LOW (ref 4.22–5.81)
RDW: 13.9 % (ref 11.5–15.5)
WBC: 18.1 10*3/uL — ABNORMAL HIGH (ref 4.0–10.5)
nRBC: 0 % (ref 0.0–0.2)

## 2019-07-19 LAB — GLUCOSE, CAPILLARY
Glucose-Capillary: 109 mg/dL — ABNORMAL HIGH (ref 70–99)
Glucose-Capillary: 115 mg/dL — ABNORMAL HIGH (ref 70–99)
Glucose-Capillary: 115 mg/dL — ABNORMAL HIGH (ref 70–99)
Glucose-Capillary: 123 mg/dL — ABNORMAL HIGH (ref 70–99)
Glucose-Capillary: 134 mg/dL — ABNORMAL HIGH (ref 70–99)
Glucose-Capillary: 138 mg/dL — ABNORMAL HIGH (ref 70–99)
Glucose-Capillary: 149 mg/dL — ABNORMAL HIGH (ref 70–99)

## 2019-07-19 LAB — BASIC METABOLIC PANEL
Anion gap: 12 (ref 5–15)
BUN: 34 mg/dL — ABNORMAL HIGH (ref 8–23)
CO2: 19 mmol/L — ABNORMAL LOW (ref 22–32)
Calcium: 8.7 mg/dL — ABNORMAL LOW (ref 8.9–10.3)
Chloride: 107 mmol/L (ref 98–111)
Creatinine, Ser: 1.46 mg/dL — ABNORMAL HIGH (ref 0.61–1.24)
GFR calc Af Amer: 53 mL/min — ABNORMAL LOW (ref >60)
GFR calc non Af Amer: 45 mL/min — ABNORMAL LOW (ref >60)
Glucose, Bld: 143 mg/dL — ABNORMAL HIGH (ref 70–99)
Potassium: 4.3 mmol/L (ref 3.5–5.1)
Sodium: 138 mmol/L (ref 135–145)

## 2019-07-19 MED ORDER — PANTOPRAZOLE SODIUM 40 MG PO PACK
40.0000 mg | PACK | Freq: Two times a day (BID) | ORAL | Status: DC
  Administered 2019-07-19 – 2019-08-26 (×74): 40 mg
  Filled 2019-07-19 (×77): qty 20

## 2019-07-19 NOTE — Progress Notes (Signed)
Attempted to collect urine specimen for UA order, pt removed condom catheter and had urinary incontinence in bed twice since this AM. Pt provided with urinal and expressed understanding of how to use. Will continue to attempt to collect UA sample

## 2019-07-19 NOTE — TOC Progression Note (Addendum)
Transition of Care [facility]) - Progression Note    Patient Details  Name: Stephen Copeland MRN: [id number] Date of Birth: Jan 06, 1941  Transition of Care [facility]) CM/SW [place], [place] Phone Number: 07/19/2019, 9:10 AM  Clinical Narrative:    Patient continues to be disoriented to situation. Patient has provided verbal permission for his wife Stephen Copeland to be contacted.   CSW met with patient and confirmed the best number is [phone]. CSW made multiple attempts and left a voicemail requesting a call back.    Expected Discharge Plan: [place] Barriers to Discharge: Continued Medical Work up  Expected Discharge Plan and Services Expected Discharge Plan: [place] arrangements for the past 2 months: Single Family Home                                       Social Determinants of Health (SDOH) Interventions    Readmission Risk Interventions No flowsheet data found.

## 2019-07-19 NOTE — Progress Notes (Signed)
Tele called to report 5 beats wide QRS. Pt assessed and asymptomatic. Dr. Lorraine Lax paged and notified.

## 2019-07-19 NOTE — Progress Notes (Signed)
STROKE TEAM PROGRESS NOTE   INTERVAL HISTORY Pt lying in bed, no family at bedside. Neuro unchanged. Worsening leukocytosis, no fever, UA negative and CXR negative.  Vitals:   07/19/19 0530 07/19/19 0821 07/19/19 1019 07/19/19 1233  BP: 126/70 111/88 (!) 152/71 132/69  Pulse: 92 82 86 74  Resp: 17 18  18   Temp: 98.6 F (37 C) 98.2 F (36.8 C)  98.2 F (36.8 C)  TempSrc: Oral Oral  Oral  SpO2:  96%  97%  Weight:      Height:        CBC:  Recent Labs  Lab 07/18/19 0321 07/19/19 0351  WBC 12.3* 18.1*  HGB 11.7* 12.4*  HCT 36.1* 38.5*  MCV 96.3 96.3  PLT 314 007    Basic Metabolic Panel:  Recent Labs  Lab 07/18/19 0321 07/19/19 0351  NA 139 138  K 4.8 4.3  CL 106 107  CO2 25 19*  GLUCOSE 123* 143*  BUN 30* 34*  CREATININE 1.45* 1.46*  CALCIUM 8.8* 8.7*    IMAGING past 24 hours DG CHEST PORT 1 VIEW  Result Date: 07/19/2019 CLINICAL DATA:  Follow-up exam.  Leukocytosis. EXAM: PORTABLE CHEST 1 VIEW COMPARISON:  07/14/2019 FINDINGS: Since the prior exam, the endotracheal tube and nasogastric tube have been removed. The enteric tube remains in place. There is persistent lung base opacities, greater on the left, most likely atelectasis. Mid and upper lungs are clear. No convincing pleural effusion. No pneumothorax. IMPRESSION: 1. No change in the lung base opacities, most likely due to atelectasis. 2. Remainder of the lungs is clear. 3. Status post extubation. Electronically Signed   By: Lajean Manes M.D.   On: 07/19/2019 11:21     PHYSICAL EXAM  General -frail elderly malnourished looking Caucasian male who is not in distress  Ophthalmologic - fundi not visualized due to noncooperation.  Cardiovascular - Regular rhythm and rate,   Neurological Exam - Patient is awake alert and interactive.  And follows commands well.  Speech mildly dysarthric. Visual field full, no hemianopia. Gaze bilaterally without difficulty, PERRL. Tracking bilaterally. Left mild facial  droop. Tongue midline. BUE 4/5. BLE 3-/5 proximal and 2/5 left foot DF/PF. DTR 1+ and no babinski. Sensation symmetrical. B FTN intact but slow. Gait not tested.    ASSESSMENT/PLAN Stephen Copeland is a 79 y.o. male with history of Afib, systolic CHF, HLD, CAD, HTN, prostate cancer presenting with left sided weakness, facial droop, dysarthria.   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 embolic secondary to known AF even on Eliquis  Code Stroke CT head evolving R MCA territory infarct (insula, cerebral cortex). hyperdense R M2 +/- M3. ASPECTS 7    CTA head & neck +LVO. Probable ruptured plaque R ICA bifurcation w/ near occlusive stenosis, possible superimposed dissection. Distal reconstitution R M1 from collaterals but w/ distal embolic occlusion proximal R M2/3 superior division branch. L ICA bifurcation and siphon atherosclerosis. B VA origins 50% stenoses. R subclavian artery origin 60% stenosis.   CT perfusion 19cc core infarct R insula and posterior frontal. 33cc penumbra.   Cerebral angio TICI3 revascularization occluded middle trifurcation R MCA branch, revascularization R ICA occlusion w/ angioplasty and stenting.   Post IR CT contrast stain R putamen and moderate R sylvian fissure, SAH + contrast.    CT head R  sylvian fissure SAH. Increased contrast vs hemorrhage R suprasellar cistern following ICA concerned for increased SAH. R sulci effacement.   Carotid Doppler  R ICA stent patent  MRI  Patchy R MCA infarcts. SAH R sylvian fissure. Small IVH.   MRA - Severely motion degraded head MRA. Persistent patency of the revascularized right ICA. No flow limiting proximal stenosis identified.   CT head repeat 5/11 R insular infarct stable. Residual small SAH and small IVH.  CT head repeat 5/13 evolving R MCA infarct, SAH unchanged   2D Echo  EF 35-40%. No source of embolus   LDL 90  HgbA1c 5.8  SCDs for VTE prophylaxis  Eliquis (apixaban) daily prior to admission, now on aspirin 81 mg daily and Brilinta (ticagrelor) 90 mg bid. Continue to hold [facility] given CT with continued SAH and IVH.   Therapy recommendations:  CIR - > pt w/o family support at home. Will need SNF  Disposition:  pending   Acute hypoxemic respiratory failure, resolved Likely Aspiration PNA  Extubated w/ CCM signing off 5/8  Vomiting x 5 with possible aspiration requiring NRB 5/10. CCM reconsulted. 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 Hold AC given post IR SAH and need for DAPT post stent placement. . Bradycardia resolved off sedation . Consider AC once SAH resolves.   Hypertension  Home meds:  Hydralazine 50 tid, metoprolol 25 bid  On Hydralazine 25 mg Q8 and metoprolol 12.5 bid . SBP 130's -140's . Long-term BP goal normotensive  Hyperlipidemia  Home meds:  No statin  LDL 90, goal < 70  May consider statin again ->pt adamantly has refused statins in the past  ARF on CKD  Cre 2.06->1.87-> 1.45->1.45->1.46  Renal US R w/ increased echogenicity. No hydro  Urine NA neg  Leukocytosis   WBC 7.7->12.3->18.1 (afebrile)  UA neg  CXR unremarkable  Continue monitoring  Chronic HFrEF Dilated Cardiomyopathy LBBB Prolonged QT  EF 30 to 40% since 2017  This admission EF 35 to 40%  CXR no pulmonary edema  Dysphagia . Secondary to stroke . NPO . not pass swallow eval . Cortrak replacement,  on TF@ 60  . Speech on board  Other Stroke Risk Factors  Advanced age  Coronary artery disease  Chronic systolic Congestive heart failure  Other Active Problems  GERD on PPI   BPH on flomax PTA  Prostate cancer   Hypokalemia - resolved  LBBB. Appears old. EKG neg ischemia. Trops 27->32  (Likely strain related)  Diarrhea - likely due to TF - Flexiseal if needed  Hospital Day #10   Stephen Hawking, MD PhD Stroke Neurology 07/19/2019 7:20 PM    To contact Stroke Continuity provider, please refer to [email or web address] After hours, contact General Neurology

## 2019-07-20 LAB — CBC
HCT: 35 % — ABNORMAL LOW (ref 39.0–52.0)
Hemoglobin: 11.3 g/dL — ABNORMAL LOW (ref 13.0–17.0)
MCH: 31.2 pg (ref 26.0–34.0)
MCHC: 32.3 g/dL (ref 30.0–36.0)
MCV: 96.7 fL (ref 80.0–100.0)
Platelets: 366 10*3/uL (ref 150–400)
RBC: 3.62 MIL/uL — ABNORMAL LOW (ref 4.22–5.81)
RDW: 14.2 % (ref 11.5–15.5)
WBC: 14.9 10*3/uL — ABNORMAL HIGH (ref 4.0–10.5)
nRBC: 0 % (ref 0.0–0.2)

## 2019-07-20 LAB — BASIC METABOLIC PANEL
Anion gap: 8 (ref 5–15)
BUN: 41 mg/dL — ABNORMAL HIGH (ref 8–23)
CO2: 21 mmol/L — ABNORMAL LOW (ref 22–32)
Calcium: 8.5 mg/dL — ABNORMAL LOW (ref 8.9–10.3)
Chloride: 111 mmol/L (ref 98–111)
Creatinine, Ser: 1.55 mg/dL — ABNORMAL HIGH (ref 0.61–1.24)
GFR calc Af Amer: 49 mL/min — ABNORMAL LOW (ref >60)
GFR calc non Af Amer: 42 mL/min — ABNORMAL LOW (ref >60)
Glucose, Bld: 128 mg/dL — ABNORMAL HIGH (ref 70–99)
Potassium: 4.2 mmol/L (ref 3.5–5.1)
Sodium: 140 mmol/L (ref 135–145)

## 2019-07-20 LAB — GLUCOSE, CAPILLARY
Glucose-Capillary: 124 mg/dL — ABNORMAL HIGH (ref 70–99)
Glucose-Capillary: 118 mg/dL — ABNORMAL HIGH (ref 70–99)
Glucose-Capillary: 122 mg/dL — ABNORMAL HIGH (ref 70–99)
Glucose-Capillary: 88 mg/dL (ref 70–99)
Glucose-Capillary: 119 mg/dL — ABNORMAL HIGH (ref 70–99)
Glucose-Capillary: 119 mg/dL — ABNORMAL HIGH (ref 70–99)

## 2019-07-20 MED ORDER — OSMOLITE 1.5 CAL PO LIQD: 600.0000 mL | ORAL | Status: DC

## 2019-07-20 MED ORDER — RESOURCE THICKENUP CLEAR PO POWD
ORAL | Status: DC | PRN
  Filled 2019-07-20: qty 125

## 2019-07-20 MED ORDER — OSMOLITE 1.5 CAL PO LIQD
600.0000 mL | ORAL | Status: DC
  Administered 2019-07-20: 600 mL
  Filled 2019-07-20: qty 711

## 2019-07-20 NOTE — Progress Notes (Signed)
Cardiac Monitoring Event  Dysrhythmia:  7 beats VT  Symptoms: none  Level of Consciousness:  alert  Last set of vital signs taken:  Temp: 98 F (36.7 C)  Pulse Rate: 67  Resp: 17  BP: (!) 160/66  SpO2: 99 %  Name of MD Notified:  Lindzen MD  Time MD Notified:  2049  Comments/Actions Taken:  None at this time

## 2019-07-20 NOTE — Progress Notes (Signed)
Cardiac Monitoring Event  Dysrhythmia: ST elevation   Symptoms:  none  Level of Consciousness:  alert  Last set of vital signs taken:  Temp: 97.8 F (36.6 C)  Pulse Rate: 79  Resp: 18  BP: (!) 153/69  SpO2: 97 %  Name of MD Notified: Aroor   Time MD Notified:  8182  Comments/Actions Taken:  No new orders at this time

## 2019-07-20 NOTE — Progress Notes (Signed)
PT Cancellation Note  Patient Details Name: Stephen Copeland MRN: [id number] DOB: 1940/05/06   Cancelled Treatment:    Reason Eval/Treat Not Completed: Other (comment)(pt getting cleaned up by NT. RN reports frequent incontinence of bowel. PT will follow up as schedule allows.    Earney Navy, PTA Acute Rehabilitation Services Pager: [phone] Office: [phone]   07/20/2019, 2:48 PM

## 2019-07-20 NOTE — Progress Notes (Signed)
  Speech Language Pathology Treatment: Dysphagia;Cognitive-Linquistic  Patient Details Name: Stephen Copeland MRN: [id number] DOB: [birth year] Today's Date: 07/20/2019 Time: 3235-5732 SLP Time Calculation (min) (ACUTE ONLY): 15 min  Assessment / Plan / Recommendation Clinical Impression  Pt alert, shows improved secretion management upon SLP arrival with dry vocal quality and no baseline coughing. He is still very distractible and resistant to very basic interventions. He verbalizes pain (informed RN) and is constantly distracted by discomfort. When offered therapeutic trials, pt refuses with many excuses (he cant swallow, he doesn't like anything, its too sweet, he has no appetite, he has a hiatal hernia, the food is too acidic). Despite reasoning and direct verbal cues and one step commands pt only accepted one ice chip and one teaspoon of honey thick liquids. There is obviously a severe oral dysphagia due to left lingual weakness. Suspect a pharyngeal dysphagia as well, but given that pt will not participate, instrumental testing is impossible (he refused on actual attempt last week). At this time, the only option is to attempt a diet with risk of aspiration and really no objective information on swallow ability. Will try puree and honey thick liquids via teaspoon. Pt is likely to have both poor intake and signs of aspiration with trials. Discussed with MD who will address these problems with the pts wife.   HPI        SLP Plan  Continue with current plan of care       Recommendations  Diet recommendations: Dysphagia 1 (puree);Honey-thick liquid Liquids provided via: Teaspoon Supervision: Full supervision/cueing for compensatory strategies;Trained caregiver to feed patient Compensations: Slow rate;Small sips/bites Postural Changes and/or Swallow Maneuvers: Seated upright 90 degrees                Follow up Recommendations: Skilled Nursing facility SLP Visit Diagnosis: Dysphagia,  unspecified (R13.10) Plan: Continue with current plan of care       GO               [name], MA [place] Pager [phone] Office [phone]  Lynann Beaver 07/20/2019, 9:24 AM

## 2019-07-20 NOTE — TOC Initial Note (Cosign Needed Addendum)
Transition of Care [facility]) - Initial/Assessment Note    Patient Details  Name: Stephen Copeland MRN: [id number] Date of Birth: 1940/11/28  Transition of Care [facility]) CM/SW Contact:    Kirstie Peri, [place] Work Phone Number: 07/20/2019, 10:48 AM  Clinical Narrative:                  MSW Intern contacted pt's spouse to discuss discharge planning. Spouse was agreeable to SNF and noted a preference for [facility] as he was there last year. MSW Intern will complete FL2 and fax out to [facility] and surrounding facilities. SW will continue to follow for discharge.  SS# incorrect in chart [phone].  Expected Discharge Plan: Skilled Nursing Facility Barriers to Discharge: Continued Medical Work up   Patient Goals and CMS Choice Patient states their goals for this hospitalization and ongoing recovery are:: Pt's wife states she is agreeable to SNF. CMS Medicare.gov Compare Post Acute Care list provided to:: Patient Represenative (must comment)(spouse) Choice offered to / list presented to : Spouse  Expected Discharge Plan and Services Expected Discharge Plan: [place]       Living arrangements for the past 2 months: Single Family Home                                      Prior Living Arrangements/Services Living arrangements for the past 2 months: Single Family Home Lives with:: Spouse Patient language and need for interpreter reviewed:: Yes Do you feel safe going back to the place where you live?: Yes      Need for Family Participation in Patient Care: Yes (Comment) Care giver support system in place?: Yes (comment)   Criminal Activity/Legal Involvement Pertinent to Current Situation/Hospitalization: No - Comment as needed  Activities of Daily Living Home Assistive Devices/Equipment: None ADL Screening (condition at time of admission) Patient's cognitive ability adequate to safely complete daily activities?: Yes Is the patient deaf or have  difficulty hearing?: No Does the patient have difficulty seeing, even when wearing glasses/contacts?: No Does the patient have difficulty concentrating, remembering, or making decisions?: No Patient able to express need for assistance with ADLs?: Yes Does the patient have difficulty dressing or bathing?: No Independently performs ADLs?: Yes (appropriate for developmental age) Does the patient have difficulty walking or climbing stairs?: No Weakness of Legs: None Weakness of Arms/Hands: None  Permission Sought/Granted Permission sought to share information with : Family Supports Permission granted to share information with : Yes, Verbal Permission Granted  Share Information with NAME: Stephen Copeland     Permission granted to share info w Relationship: Spouse  Permission granted to share info w Contact Information: [phone]  Emotional Assessment Appearance:: Other (Comment Required(Unable to Assess) Attitude/Demeanor/Rapport: Unable to Assess Affect (typically observed): Unable to Assess Orientation: : Oriented to Self, Oriented to Place, Oriented to  Time Alcohol / Substance Use: Not Applicable Psych Involvement: No (comment)  Admission diagnosis:  Acute ischemic stroke ([place]) [C94.7] Acute embolic stroke ([place]) [S96.2] Atrial fibrillation with RVR ([place]) [I48.91] Cerebrovascular accident (CVA), unspecified mechanism ([place]) [I63.9] Middle cerebral artery embolism, right [I66.01] Patient Active Problem List   Diagnosis Date Noted  . Acute embolic stroke ([place])   . SBO (small bowel obstruction) ([place])   . History of prostate cancer   . Stage 3 chronic kidney disease   . Agitation   . Supplemental oxygen dependent   . Middle cerebral  artery embolism, 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  . Acute hypoxemic respiratory failure ([place])   . Acute ischemic stroke ([place]) 07/09/2019  . Vasovagal syncope 07/18/2017  . S/P TURP 07/18/2017  . MRSA bacteremia 07/07/2017  . Pseudomonas sepsis ([place]) 07/07/2017  .  Pseudomonas urinary tract infection 07/07/2017  . Coronary artery disease 07/07/2017  . 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 Encounter for therapeutic drug monitoring 04/09/2017  . Right lower lobe pulmonary nodule 12/19/2016  . Noncompliance with treatment plan 12/19/2016  . Bacteremia due to Enterococcus 12/19/2016  . Enterococcus UTI 12/19/2016  . Acute renal failure (ARF) ([place]) 12/18/2016  . Hyperkalemia 12/18/2016  . Hyperbilirubinemia 12/18/2016  . UTI (urinary tract infection) 05/31/2016  . Acute urinary retention 05/31/2016  . Acute kidney injury superimposed on chronic kidney disease ([place]) 05/31/2016  . Generalized weakness 05/31/2016  . Falls 05/31/2016  . Acute systolic CHF (congestive heart failure) ([place])   . Congestive dilated cardiomyopathy ([place])   . Atrial fibrillation with RVR ([place])   . Uncontrolled hypertension   . Pulmonary hypertension ([place])   . Chronic systolic CHF (congestive heart failure) ([place])   . Paroxysmal atrial fibrillation ([place]) 12/19/2015  . Hypertension 12/19/2015  . New onset a-fib ([place]) 12/19/2015  . Acute CHF ([place]) 12/19/2015  . Hyperglycemia 12/19/2015  . Acid reflux   . Hyperlipidemia   . Sinus bradycardia   . Gastroesophageal reflux disease   . Other hyperlipidemia   . Elevated troponin    PCP:  [name]., MD Pharmacy:   [place], [place]. [place]. [place] [address] Phone: [phone] Fax: [phone]  [facility] [place], [place] [address] [place] [address] Phone: [phone] Fax: [phone]     Social Determinants of Health (SDOH) Interventions    Readmission Risk Interventions No flowsheet data found.

## 2019-07-20 NOTE — Progress Notes (Signed)
STROKE TEAM PROGRESS NOTE   INTERVAL HISTORY No family at bedside. Pt not cooperative on swallow eval but we feel he likely to pass swallow at this time and we will give him a trial of honey thick liquid. Will change TF to nocturnal TF to encourage po intake and decrease diarrhea. UA neg. SNF pending but will likely need PEG or able to have adequate po intake before discharge.   Vitals:   07/20/19 0632 07/20/19 0803 07/20/19 0944 07/20/19 1133  BP: (!) 153/69 134/64 (!) 135/56 (!) 120/57  Pulse: 79 76 87 63  Resp: 18 18 17 16   Temp: 97.8 F (36.6 C) 98.2 F (36.8 C) 98 F (36.7 C) 98.1 F (36.7 C)  TempSrc: Oral Oral Oral   SpO2: 97% 96% 97% 98%  Weight:      Height:        CBC:  Recent Labs  Lab 07/19/19 0351 07/20/19 0345  WBC 18.1* 14.9*  HGB 12.4* 11.3*  HCT 38.5* 35.0*  MCV 96.3 96.7  PLT 359 981    Basic Metabolic Panel:  Recent Labs  Lab 07/19/19 0351 07/20/19 0345  NA 138 140  K 4.3 4.2  CL 107 111  CO2 19* 21*  GLUCOSE 143* 128*  BUN 34* 41*  CREATININE 1.46* 1.55*  CALCIUM 8.7* 8.5*    IMAGING past 24 hours No results found.   PHYSICAL EXAM    General -frail elderly malnourished looking Caucasian male who is not in distress  Ophthalmologic - fundi not visualized due to noncooperation.  Cardiovascular - Regular rhythm and rate,   Neurological Exam - Patient is awake alert and interactive.  And follows commands well.  Speech mildly dysarthric. Visual field full, no hemianopia. Gaze bilaterally without difficulty, PERRL. Tracking bilaterally. Left mild facial droop. Tongue midline. BUE 4/5. BLE 3-/5 proximal and 2/5 left foot DF/PF. DTR 1+ and no babinski. Sensation symmetrical. B FTN intact but slow. Gait not tested.    ASSESSMENT/PLAN [name] is a 79 y.o. male with history of Afib, systolic CHF, HLD, CAD, HTN, prostate cancer presenting with left sided weakness, facial droop, dysarthria.   Stroke:   Patchy R MCA infarcts due to right  ICA and M2 occlusion, s/p IR w/ M2/3 TICI3 revascularization and R ICA stent with small SAH & IVH - infarct likely right ICA atherosclerosis vs. embolic secondary to known AF even on Eliquis  Code Stroke CT head evolving R MCA territory infarct (insula, cerebral cortex). hyperdense R M2 +/- M3. ASPECTS 7    CTA head & neck +LVO. Probable ruptured plaque R ICA bifurcation w/ near occlusive stenosis, possible superimposed dissection. Distal reconstitution R M1 from collaterals but w/ distal embolic occlusion proximal R M2/3 superior division branch. L ICA bifurcation and siphon atherosclerosis. B VA origins 50% stenoses. R subclavian artery origin 60% stenosis.   CT perfusion 19cc core infarct R insula and posterior frontal. 33cc penumbra.   Cerebral angio TICI3 revascularization occluded middle trifurcation R MCA branch, revascularization R ICA occlusion w/ angioplasty and stenting.   Post IR CT contrast stain R putamen and moderate R sylvian fissure, SAH + contrast.    CT head R sylvian fissure SAH. Increased contrast vs hemorrhage R suprasellar cistern following ICA concerned for increased SAH. R sulci effacement.   Carotid Doppler  R ICA stent patent  MRI  Patchy R MCA infarcts. SAH R sylvian fissure. Small IVH.   MRA - Severely motion degraded head MRA. Persistent patency of the revascularized  right ICA. No flow limiting proximal stenosis identified.   CT head repeat 5/11 R insular infarct stable. Residual small SAH and small IVH.  CT head repeat 5/13 evolving R MCA infarct, SAH unchanged   2D Echo EF 35-40%. No source of embolus   LDL 90  HgbA1c 5.8  SCDs for VTE prophylaxis  Eliquis (apixaban) daily prior to admission, now on aspirin 81 mg daily and Brilinta (ticagrelor) 90 mg bid. Continue to hold [facility] given CT with continued SAH and IVH.   Therapy recommendations:  CIR - > pt w/o family support at home. Will need SNF  Disposition:  pending   Acute hypoxemic respiratory  failure, resolved Likely Aspiration PNA  Extubated w/ CCM signing off 5/8  Vomiting x 5 with possible aspiration requiring NRB 5/10. CCM reconsulted. 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 Hold AC given post IR SAH and need for DAPT post stent placement. . Bradycardia resolved off sedation . Consider AC once SAH resolves.   Hypertension  Home meds:  Hydralazine 50 tid, metoprolol 25 bid  On Hydralazine 25 mg Q8 and metoprolol 12.5 bid . SBP 130's -140's . Long-term BP goal normotensive  Hyperlipidemia  Home meds:  No statin  LDL 90, goal < 70  May consider statin again ->pt adamantly has refused statins in the past  ARF on CKD  Cre 2.06->1.87-> 1.45->1.45->1.46->1.55  Renal US R w/ increased echogenicity. 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 Secondary to stroke . SLP difficulty d/t pt participation. . Trial D1 puree w/ honey thick liquids . Change to nocturnal TF@ 60 for 10 hours daily . Encourage po intake . If po not adequate, pt will need PEG before SNF placement . Speech on board  Other Stroke Risk Factors  Advanced age  Coronary artery disease  Chronic systolic Congestive heart failure  Other Active Problems  GERD on PPI   BPH on flomax PTA  Prostate cancer    Hypokalemia - resolved  LBBB. Appears old. EKG neg ischemia. Trops 27->32  (Likely strain related)  Hospital Day #11   [name], MD PhD Stroke Neurology 07/20/2019 2:08 PM    To contact Stroke Continuity provider, please refer to [email or web address] After hours, contact General Neurology

## 2019-07-20 NOTE — Progress Notes (Signed)
Tele called, patient's HR in 150s for 5 beats. Dr. Erlinda Hong notified, will continue to monitor. Patient asymptomatic and alert, VS taken

## 2019-07-21 LAB — BASIC METABOLIC PANEL
Anion gap: 11 (ref 5–15)
BUN: 39 mg/dL — ABNORMAL HIGH (ref 8–23)
CO2: 24 mmol/L (ref 22–32)
Calcium: 8.6 mg/dL — ABNORMAL LOW (ref 8.9–10.3)
Chloride: 109 mmol/L (ref 98–111)
Creatinine, Ser: 1.5 mg/dL — ABNORMAL HIGH (ref 0.61–1.24)
GFR calc Af Amer: 51 mL/min — ABNORMAL LOW (ref >60)
GFR calc non Af Amer: 44 mL/min — ABNORMAL LOW (ref >60)
Glucose, Bld: 110 mg/dL — ABNORMAL HIGH (ref 70–99)
Potassium: 4 mmol/L (ref 3.5–5.1)
Sodium: 144 mmol/L (ref 135–145)

## 2019-07-21 LAB — CBC
HCT: 34.4 % — ABNORMAL LOW (ref 39.0–52.0)
Hemoglobin: 10.9 g/dL — ABNORMAL LOW (ref 13.0–17.0)
MCH: 31 pg (ref 26.0–34.0)
MCHC: 31.7 g/dL (ref 30.0–36.0)
MCV: 97.7 fL (ref 80.0–100.0)
Platelets: 386 10*3/uL (ref 150–400)
RBC: 3.52 MIL/uL — ABNORMAL LOW (ref 4.22–5.81)
RDW: 14.2 % (ref 11.5–15.5)
WBC: 11.7 10*3/uL — ABNORMAL HIGH (ref 4.0–10.5)
nRBC: 0 % (ref 0.0–0.2)

## 2019-07-21 LAB — GLUCOSE, CAPILLARY
Glucose-Capillary: 102 mg/dL — ABNORMAL HIGH (ref 70–99)
Glucose-Capillary: 105 mg/dL — ABNORMAL HIGH (ref 70–99)
Glucose-Capillary: 108 mg/dL — ABNORMAL HIGH (ref 70–99)
Glucose-Capillary: 112 mg/dL — ABNORMAL HIGH (ref 70–99)
Glucose-Capillary: 115 mg/dL — ABNORMAL HIGH (ref 70–99)
Glucose-Capillary: 86 mg/dL (ref 70–99)

## 2019-07-21 MED ORDER — FREE WATER
150.0000 mL | Freq: Four times a day (QID) | Status: DC
  Administered 2019-07-21 – 2019-07-23 (×10): 150 mL

## 2019-07-21 MED ORDER — OSMOLITE 1.5 CAL PO LIQD
660.0000 mL | ORAL | Status: DC
  Administered 2019-07-21 – 2019-07-22 (×2): 660 mL
  Filled 2019-07-21 (×2): qty 711

## 2019-07-21 NOTE — Progress Notes (Signed)
Occupational Therapy Treatment Patient Details Name: Stephen Copeland MRN: [id number] DOB: 04-Jun-1940 Today's Date: 07/21/2019    History of present illness 79 y.o. male admitted on 07/09/19 for L sided weakness, facial droop, and slurred speech.  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  He did not get any tPA as he was outside of the window.  Pt intubated 5/6 for procedure and extubated in the AM of 07/10/19.  Pt with other significant PMH of sinus brady, scoliosis, prostate CA s/p surgery, HTN, dilated cardiomyopathy, CAD, CHF.  Patient re-intubated 5/10 with inability to clear secretions.. Extubated 5/11.    OT comments  Patient continues to make steady progress towards goals in skilled OT session. Patient's session encompassed co-treat with PT in order to increase overall activity tolerance and endurance to complete functional tasks. Pt with increase in participation in session with therapist's however is dependent on cues in order to maintain attention to task. Pt with increased ability in transfers, however remains inconsistent in presentation as pt was able to stand with mod/max A of 1, but was a max of 2 for bed mobility. Discharge remains appropriate as pt continues to remain inconsistent in presentation from a cognition and participation standpoint; will continue to follow acutely.    Follow Up Recommendations  SNF;Supervision/Assistance - 24 hour    Equipment Recommendations  Other (comment)(TBD)    Recommendations for Other Services      Precautions / Restrictions Precautions Precautions: Fall Precaution Comments: coretrack Restrictions Weight Bearing Restrictions: No       Mobility Bed Mobility Overal bed mobility: Needs Assistance Bed Mobility: Supine to Sit Rolling: (P) Max assist;+2 for physical assistance Sidelying to sit: +2 for physical assistance;Max assist Supine to sit: (P) Max assist;+2 for physical assistance Sit to supine: (P)  Max assist;+2 for physical assistance   General bed mobility comments: tactile cues to sequence legs off of bed, +2 assist to engage trunk to sit up, stated he was dizzy upon sitting up, but was unable to answer any more questions in regards to dizziness as pt was more focused on his nose and the coretrack  Transfers Overall transfer level: Needs assistance Equipment used: Rolling walker (2 wheeled) Transfers: Sit to/from Stand Sit to Stand: Mod assist;Max assist        Lateral/Scoot Transfers: (P) Max assist;+2 physical assistance General transfer comment: Able to stand with walker with mod/max A of 1 and pivot to chair (increased cues for sequencing and hand placement), unaware of bowel incontinence upon standing requiring total A to for peri-care    Balance Overall balance assessment: Needs assistance Sitting-balance support: Feet supported Sitting balance-Leahy Scale: Fair Sitting balance - Comments: (P) sitting EOB about 10 minutes trying to get pt to stand for linen change, continued to complain about various issues   Standing balance support: Bilateral upper extremity supported Standing balance-Leahy Scale: Poor Standing balance comment: reliant on external support from AD                           ADL either performed or assessed with clinical judgement   ADL Overall ADL's : Needs assistance/impaired     Grooming: Set up;Wash/dry face;Wash/dry hands;Bed level Grooming Details (indicate cue type and reason): Prior to sitting EOB, increased cues to maintain attention to task                 Toilet Transfer: Moderate assistance;Stand-pivot Toilet Transfer Details (indicate cue  type and reason): simulated to recliner, will require +2 for ambulation for safety Toileting- Clothing Manipulation and Hygiene: Total assistance Toileting - Clothing Manipulation Details (indicate cue type and reason): incontinent BM/urine     Functional mobility during ADLs:  Moderate assistance;Maximal assistance General ADL Comments: Pt mod/max A in order to stand/pivot to chair, will likely need plus 2 to progress further, pt requires constant cueing to pay attention to task (however presentation is not perseverative)     Vision       Perception     Praxis      Cognition Arousal/Alertness: Awake/alert Behavior During Therapy: WFL for tasks assessed/performed Overall Cognitive Status: Impaired/Different from baseline Area of Impairment: Orientation;Attention;Memory;Following commands;Safety/judgement;Awareness;Problem solving                 Orientation Level: Disoriented to;Time Current Attention Level: Sustained Memory: Decreased recall of precautions;Decreased short-term memory Following Commands: Follows one step commands with increased time Safety/Judgement: Decreased awareness of safety;Decreased awareness of deficits Awareness: Emergent Problem Solving: Decreased initiation;Difficulty sequencing;Requires verbal cues;Requires tactile cues;Slow processing General Comments: Pt continues to be distracted by coretrack, but was able to participate in more functional tasks in therapy to date with constant reminders to reorient to task        Exercises Exercises: (P) General Upper Extremity General Exercises - Upper Extremity Shoulder Flexion: (P) 5 reps;Both;AAROM;AROM;Supine Shoulder ABduction: (P) AROM;AAROM;Both;5 reps;Supine Elbow Flexion: (P) AROM;AAROM;Both;5 reps;Seated Elbow Extension: (P) AROM;AAROM;Both;5 reps;Supine Digit Composite Flexion: (P) AROM;Strengthening;Both;10 reps;Seated Composite Extension: (P) AROM;Strengthening;Both;5 reps;Seated   Shoulder Instructions       General Comments      Pertinent Vitals/ Pain       Pain Assessment: Faces Pain Score: 8  Faces Pain Scale: No hurt Pain Location: (P) nose, generalized Pain Descriptors / Indicators: (P) Discomfort;Grimacing Pain Intervention(s): (P) Monitored  during session;Repositioned  Home Living                                          Prior Functioning/Environment              Frequency  Min 2X/week        Progress Toward Goals  OT Goals(current goals can now be found in the care plan section)  Progress towards OT goals: Progressing toward goals  Acute Rehab OT Goals Patient Stated Goal: pt unable to state OT Goal Formulation: Patient unable to participate in goal setting Time For Goal Achievement: 07/24/19 Potential to Achieve Goals: [place] Discharge plan remains appropriate    Co-evaluation    PT/OT/SLP Co-Evaluation/Treatment: Yes Reason for Co-Treatment: Complexity of the patient's impairments (multi-system involvement);Necessary to address cognition/behavior during functional activity;For patient/therapist safety;To address functional/ADL transfers PT goals addressed during session: (P) Mobility/safety with mobility OT goals addressed during session: ADL's and self-care;Strengthening/ROM      AM-PAC OT "6 Clicks" Daily Activity     Outcome Measure   Help from another person eating meals?: A Lot Help from another person taking care of personal grooming?: A Lot Help from another person toileting, which includes using toliet, bedpan, or urinal?: Total Help from another person bathing (including washing, rinsing, drying)?: A Lot Help from another person to put on and taking off regular upper body clothing?: Total Help from another person to put on and taking off regular lower body clothing?: Total 6 Click Score: 9    End of Session Equipment Utilized  During Treatment: Gait belt;Oxygen  OT Visit Diagnosis: Unsteadiness on feet (R26.81);Other abnormalities of gait and mobility (R26.89);Muscle weakness (generalized) (M62.81);Pain;Other symptoms and signs involving cognitive function   Activity Tolerance Patient tolerated treatment well   Patient Left in chair;with call bell/phone within  reach;with chair alarm set   Nurse Communication Mobility status        Time: 3474-2595 OT Time Calculation (min): 35 min  Charges: OT General Charges $OT Visit: 1 Visit OT Treatments $Self Care/Home Management : 8-22 mins  Corinne Ports E. Kristeen Lantz, COTA/L Acute Rehabilitation Services [place] 07/21/2019, 2:07 PM

## 2019-07-21 NOTE — Progress Notes (Signed)
STROKE TEAM PROGRESS NOTE   INTERVAL HISTORY RN at bedside. Pt sitting in bed, awake alert talkative. As per RN, pt ate about 10-15% meal each time. On nocturnal feeding. Calore count ongoing.  Overnight reported two small blood clots in loose stool. H&H stable, no overt bleeding source, will continue to monitor  Vitals:   07/21/19 0313 07/21/19 0511 07/21/19 0824 07/21/19 1212  BP: (!) 149/66 137/68 (!) 153/64 (!) 151/70  Pulse: 64 66 62 64  Resp: 18  16 20   Temp: 97.7 F (36.5 C)  97.9 F (36.6 C) 98.2 F (36.8 C)  TempSrc: Oral  Oral Oral  SpO2: 99%  98% 97%  Weight:      Height:        CBC:  Recent Labs  Lab 07/20/19 0345 07/21/19 0223  WBC 14.9* 11.7*  HGB 11.3* 10.9*  HCT 35.0* 34.4*  MCV 96.7 97.7  PLT 366 671    Basic Metabolic Panel:  Recent Labs  Lab 07/20/19 0345 07/21/19 0223  NA 140 144  K 4.2 4.0  CL 111 109  CO2 21* 24  GLUCOSE 128* 110*  BUN 41* 39*  CREATININE 1.55* 1.50*  CALCIUM 8.5* 8.6*    IMAGING past 24 hours No results found.   PHYSICAL EXAM   General -frail elderly malnourished looking Caucasian male who is not in distress  Ophthalmologic - fundi not visualized due to noncooperation.  Cardiovascular - Regular rhythm and rate,   Neurological Exam - Patient is awake alert and interactive.  And follows commands well.  Speech mildly dysarthric. Visual field full, no hemianopia. Gaze bilaterally without difficulty, PERRL. Tracking bilaterally. Left mild facial droop. Tongue midline. BUE 4/5. BLE 3-/5 proximal and 2/5 left foot DF/PF. DTR 1+ and no babinski. Sensation symmetrical. B FTN intact but slow. Gait not tested.    ASSESSMENT/PLAN Stephen Copeland is a 79 y.o. male with history of Afib, systolic CHF, HLD, CAD, HTN, prostate cancer presenting with left sided weakness, facial droop, dysarthria.   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 embolic secondary to known AF even on Eliquis  Code Stroke CT head evolving R MCA territory infarct (insula, cerebral cortex). hyperdense R M2 +/- M3. ASPECTS 7    CTA head & neck +LVO. Probable ruptured plaque R ICA bifurcation w/ near occlusive stenosis, possible superimposed dissection. Distal reconstitution R M1 from collaterals but w/ distal embolic occlusion proximal R M2/3 superior division branch. L ICA bifurcation and siphon atherosclerosis. B VA origins 50% stenoses. R subclavian artery origin 60% stenosis.   CT perfusion 19cc core infarct R insula and posterior frontal. 33cc penumbra.   Cerebral angio TICI3 revascularization occluded middle trifurcation R MCA branch, revascularization R ICA occlusion w/ angioplasty and stenting.   Post IR CT contrast stain R putamen and moderate R sylvian fissure, SAH + contrast.    CT head R sylvian fissure SAH. Increased contrast vs hemorrhage R suprasellar cistern following ICA concerned for increased SAH. R sulci effacement.   Carotid Doppler  R ICA stent patent  MRI  Patchy R MCA infarcts. SAH R sylvian fissure. Small IVH.   MRA - Severely motion degraded head MRA. Persistent patency of the revascularized right ICA. No flow limiting proximal stenosis identified.   CT head repeat 5/11 R insular infarct stable. Residual small SAH and  small IVH.  CT head repeat 5/13 evolving R MCA infarct, SAH unchanged   2D Echo EF 35-40%. No source of embolus   LDL 90  HgbA1c 5.8  SCDs for VTE prophylaxis  Eliquis (apixaban) daily prior to admission, now on aspirin 81 mg daily and Brilinta (ticagrelor) 90 mg bid. Continue to hold [facility] given CT with continued SAH and IVH.   Therapy recommendations:  CIR - > pt w/o family support at home. Will need SNF  Disposition:  [facility] SNF following  Acute hypoxemic respiratory failure, resolved Likely Aspiration PNA, treated  Extubated w/ CCM  signing off 5/8  Vomiting x 5 with possible aspiration requiring NRB 5/10. CCM reconsulted. 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 Hold AC given post IR SAH and need for DAPT post stent placement. . Bradycardia resolved off sedation . Consider AC once SAH resolves.   Hypertension  Home meds:  Hydralazine 50 tid, metoprolol 25 bid  On Hydralazine 25 mg Q8 and metoprolol 12.5 bid . SBP 130's -140's . Long-term BP goal normotensive  Hyperlipidemia  Home meds:  No statin  LDL 90, goal < 70  May consider statin again ->pt adamantly has refused statins in the past  ARF on CKD  Cre 1.50  Renal US R w/ increased echogenicity. 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 Secondary to stroke . SLP difficulty d/t pt participation. . Trial D1 puree w/ honey thick liquids . On nocturnal TF@ 60 for 10 hours daily . Added free water 150 mL q6h . Encourage po intake . Calorie count - ongoing . If po not adequate, pt will need PEG before SNF placement . Speech and dietician on board  Other Stroke Risk Factors  Advanced age  Coronary artery disease  Chronic systolic Congestive heart failure  Other Active Problems  GERD on PPI   BPH on flomax PTA  Prostate cancer   Hypokalemia - resolved  LBBB. Appears old. EKG neg  ischemia. Trops 27->32  (Likely strain related)  2 small blood clot in loose stool. Hgb stable. Continue to monitor.    Hospital Day #12   [name], MD PhD Stroke Neurology 07/21/2019 1:00 PM    To contact Stroke Continuity provider, please refer to [email or web address] After hours, contact General Neurology

## 2019-07-21 NOTE — NC FL2 (Signed)
[place] LEVEL OF CARE SCREENING TOOL     IDENTIFICATION  Patient Name: Stephen Copeland Birthdate: 1940/09/21 Sex: male Admission Date (Current Location): 07/09/2019  [facility] and [place] Number:  Herbalist and Address:  The . [facility], [place] [address] [place], [place] [address]      Provider Number: [id number]  Attending Physician Name and Address:  [name], MD  Relative Name and Phone Number:  Jhamari Markowicz [phone]    Current Level of Care: Hospital Recommended Level of Care: [place] Prior Approval Number:    Date Approved/Denied:   PASRR Number: [id number]  Discharge Plan: SNF    Current Diagnoses: Patient Active Problem List   Diagnosis Date Noted  . Acute embolic stroke ([place])   . SBO (small bowel obstruction) ([place])   . History of prostate cancer   . Stage 3 chronic kidney disease   . Agitation   . Supplemental oxygen dependent   . 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 Acute hypoxemic respiratory failure ([place])   . Acute ischemic stroke ([place]) 07/09/2019  . Vasovagal syncope 07/18/2017  . S/P TURP 07/18/2017  . MRSA bacteremia 07/07/2017  . Pseudomonas sepsis ([place]) 07/07/2017  . Pseudomonas urinary tract infection 07/07/2017  . Coronary artery disease 07/07/2017  . 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 Encounter for therapeutic drug monitoring 04/09/2017  . Right lower lobe pulmonary nodule 12/19/2016  . Noncompliance with treatment plan 12/19/2016  . Bacteremia due to Enterococcus 12/19/2016  . Enterococcus UTI 12/19/2016  . Acute renal failure (ARF) ([place]) 12/18/2016  . Hyperkalemia 12/18/2016  . Hyperbilirubinemia 12/18/2016  . UTI (urinary tract infection) 05/31/2016  . Acute urinary retention 05/31/2016  . Acute kidney injury superimposed on chronic kidney disease ([place]) 05/31/2016  . Generalized weakness 05/31/2016  . Falls 05/31/2016  .  Acute systolic CHF (congestive heart failure) ([place])   . Congestive dilated cardiomyopathy ([place])   . Atrial fibrillation with RVR ([place])   . Uncontrolled hypertension   . Pulmonary hypertension ([place])   . Chronic systolic CHF (congestive heart failure) ([place])   . Paroxysmal atrial fibrillation ([place]) 12/19/2015  . Hypertension 12/19/2015  . New onset a-fib ([place]) 12/19/2015  . Acute CHF ([place]) 12/19/2015  . Hyperglycemia 12/19/2015  . Acid reflux   . Hyperlipidemia   . Sinus bradycardia   . Gastroesophageal reflux disease   . Other hyperlipidemia   . Elevated troponin     Orientation RESPIRATION BLADDER Height & Weight     Self, Time, Place  O2([place] 4) Incontinent Weight: 182 lb 5.1 oz (82.7 kg) Height:  6\' 2"  (188 cm)  BEHAVIORAL SYMPTOMS/MOOD NEUROLOGICAL BOWEL NUTRITION STATUS      Incontinent Diet(See DC summary)  AMBULATORY STATUS COMMUNICATION OF NEEDS Skin   Extensive Assist Verbally Other (Comment), Surgical wounds(MASD arm leg and buttock, right and left. Surgical incision right groin.)                       Personal Care Assistance Level of Assistance  Bathing, Feeding, Dressing Bathing Assistance: Maximum assistance Feeding assistance: Maximum assistance Dressing Assistance: Maximum assistance     Functional Limitations Info  Sight, Hearing, Speech Sight Info: Adequate Hearing Info: Adequate Speech Info: Impaired(Difficulty speaking)    SPECIAL CARE FACTORS FREQUENCY  PT (By licensed PT), OT (By licensed OT), Speech therapy     PT Frequency: 5x week OT Frequency: 5x week     Speech  Therapy Frequency: 5x week      Contractures Contractures Info: Not present    Additional Factors Info  Code Status, Allergies, Isolation Precautions, Insulin Sliding Scale Code Status Info: Full Allergies Info: NKA   Insulin Sliding Scale Info: Insulin Aspart (Novolog) 0-15 U every 4 hours Isolation Precautions Info: MRSA Positive     Current Medications (07/21/2019):   This is the current hospital active medication list Current Facility-Administered Medications  Medication Dose Route Frequency Provider Last Rate Last Admin  . 0.9 %  sodium chloride infusion  250 mL Intravenous Continuous Garvin Fila, MD      . acetaminophen (TYLENOL) tablet 650 mg  650 mg Oral Q4H PRN Deveshwar, Willaim Rayas, MD       Or  . acetaminophen (TYLENOL) 160 MG/5ML solution 650 mg  650 mg Per Tube Q4H PRN Luanne Bras, MD   650 mg at 07/21/19 0404   Or  . acetaminophen (TYLENOL) suppository 650 mg  650 mg Rectal Q4H PRN Deveshwar, Willaim Rayas, MD      . aspirin chewable tablet 81 mg  81 mg Per Tube Daily [name], MD   81 mg at 07/21/19 4098  . atorvastatin (LIPITOR) tablet 40 mg  40 mg Oral Daily [name], MD   40 mg at 07/21/19 0809  . chlorhexidine (PERIDEX) 0.12 % solution 15 mL  15 mL Mouth Rinse BID Aroor, Lanice Schwab, MD   15 mL at 07/21/19 0807  . Chlorhexidine Gluconate Cloth 2 % PADS 6 each  6 each Topical Daily Aroor, Lanice Schwab, MD   6 each at 07/21/19 0809  . docusate (COLACE) 50 MG/5ML liquid 50 mg  50 mg Per Tube Daily Garvin Fila, MD   50 mg at 07/21/19 1191  . feeding supplement (OSMOLITE 1.5 CAL) liquid 660 mL  660 mL Per Tube Q24H [name], MD      . feeding supplement (PRO-STAT SUGAR FREE 64) liquid 30 mL  30 mL Per Tube Daily [name], MD   30 mL at 07/21/19 0808  . free water 150 mL  150 mL Per Tube Q6H [name], MD   150 mL at 07/21/19 0809  . guaiFENesin (ROBITUSSIN) 100 MG/5ML solution 100 mg  5 mL Per Tube Q4H PRN Garvin Fila, MD      . hydrALAZINE (APRESOLINE) injection 20 mg  20 mg Intravenous Q6H PRN Garvin Fila, MD      . hydrALAZINE (APRESOLINE) tablet 25 mg  25 mg Per Tube Q8H Audria Nine, DO   25 mg at 07/21/19 0514  . insulin aspart (novoLOG) injection 0-15 Units  0-15 Units Subcutaneous Q4H Corey Harold, NP   2 Units at 07/20/19 1235  . labetalol (NORMODYNE) injection 20 mg  20 mg Intravenous Q2H PRN Garvin Fila, MD   20 mg at 07/15/19 2030  . lactated ringers infusion   Intravenous Continuous Candee Furbish, MD 75 mL/hr at 07/20/19 2307 New Bag at 07/20/19 2307  . lidocaine (XYLOCAINE) 5 % ointment 1 application  1 application Topical BID PRN Donzetta Starch, NP   1 application at 47/82/95 1152  . MEDLINE mouth rinse  15 mL Mouth Rinse q12n4p Aroor, Karena Addison R, MD   15 mL at 07/20/19 1644  . metoprolol tartrate (LOPRESSOR) 25 mg/10 mL oral suspension 12.5 mg  12.5 mg Per Tube BID Burnetta Sabin L, NP   12.5 mg at 07/21/19 1005  . ondansetron (ZOFRAN) injection 4 mg  4 mg  Intravenous Q6H PRN Greta Doom, MD   4 mg at 07/13/19 0739  . oxyCODONE (ROXICODONE) 5 MG/5ML solution 5 mg  5 mg Per Tube Q4H PRN Audria Nine, DO   5 mg at 07/15/19 2236  . pantoprazole sodium (PROTONIX) 40 mg/20 mL oral suspension 40 mg  40 mg Per Tube BID [name], MD   40 mg at 07/21/19 2072  . Resource ThickenUp Clear   Oral PRN [name], MD      . ticagrelor [facility]) tablet 90 mg  90 mg Oral BID Luanne Bras, MD   90 mg at 07/18/19 1007   Or  . ticagrelor (BRILINTA) tablet 90 mg  90 mg Per Tube BID Luanne Bras, MD   90 mg at 07/21/19 0809     Discharge Medications: Please see discharge summary for a list of discharge medications.  Relevant Imaging Results:  Relevant Lab Results:   Additional Information SS# [place], Student-Social Work

## 2019-07-21 NOTE — Progress Notes (Signed)
Paged Dr. Cheral Marker regarding two small blood clots in pt's loose stool. No new orders at this time. Will update MD with any changes.

## 2019-07-21 NOTE — Progress Notes (Signed)
Called by RN again regarding recurrent small blood clots in the patient's stool. He is off anticoagulation. Most recent Hgb was 10.9, slightly lower than yesterday. Stroke Team to consider GI consult in AM for colonoscopy.   Electronically signed: Dr. Kerney Elbe

## 2019-07-21 NOTE — Progress Notes (Addendum)
  Speech Language Pathology Treatment: Dysphagia  Patient Details Name: Stephen Copeland MRN: [id number] DOB: 1941/02/18 Today's Date: 07/21/2019 Time: 1025-1050 SLP Time Calculation (min) (ACUTE ONLY): 25 min  Assessment / Plan / Recommendation Clinical Impression  Pt seen at bedside for assessment of diet tolerance and continued education. RN reports poor intake, but appears to tolerate Dys1 and Honey thick liquids. Cortrak is in place. Speech was noted to be fully intelligible today, although mild dysarthria was noted. Pt was provided with new suction tubing due to buildup of secretions in previous tubing. Pt demonstrated congested, semi-productive cough, but was unable to bring mucus up to the oral cavity to be removed via suction, despite multimodal cues. Pt was encouraged to suction only when he coughs something up, not to manage ongoing saliva production with suction. Pt exhibited fair attention to task during oral care, talking incessantly while brushing his teeth. Following oral care, pt accepted minimal boluses of honey thick liquid. No overt s/s aspiration observed. Safe swallow precautions posted at [facility] and reviewed with pt. Nursing arrived to complete patient care, so session was discontinued. SLP will continue to follow per plan of care.   HPI HPI: 79 y/o M admitted 5/6 with right sided CVA and remained intubated post thrombectomy and carotid stent. He was outside the TPA window. Intubated 5/6-5/7, then on 5/10 pt had multiple episodes of vomiting brown gastric contents and was reintubted. Pt was seen by SLP in the interim, found to have acute neurogenic dysphagia with signs of aspiration with minimal PO trials. Has cortrak, on D1/HTL diet.      SLP Plan  Continue with current plan of care       Recommendations  Diet recommendations: Dysphagia 1 (puree);Honey-thick liquid Liquids provided via: Cup;Straw Medication Administration: Via alternative means Supervision: Full  supervision/cueing for compensatory strategies;Trained caregiver to feed patient Compensations: Slow rate;Small sips/bites;Minimize environmental distractions Postural Changes and/or Swallow Maneuvers: Seated upright 90 degrees                Oral Care Recommendations: Oral care QID;Staff/trained caregiver to provide oral care Follow up Recommendations: Skilled Nursing facility SLP Visit Diagnosis: Dysphagia, unspecified (R13.10);Cognitive communication deficit (R41.841) Plan: Continue with current plan of care       [place]. Stephen Copeland, [facility], [place] Speech Language Pathologist Office: [phone] Pager: [phone]  Stephen Copeland 07/21/2019, 10:54 AM

## 2019-07-21 NOTE — Progress Notes (Signed)
Pt continuing to have very small blood clots in loose stool. Dr. Cheral Marker notified.

## 2019-07-21 NOTE — Progress Notes (Addendum)
Called by RN regarding two small blood clots in loose stool. He is off anticoagulation. Most recent Hgb was 11.3. Stroke Team to consider GI consult in AM for colonoscopy.   Will need to continue ASA and Brilinta for now as morbidity/mortality risk from recurrent stroke significantly outweighs M/M from GI bleed.   Electronically signed: Dr. Kerney Elbe

## 2019-07-21 NOTE — Progress Notes (Signed)
Physical Therapy Treatment Patient Details Name: Stephen Copeland MRN: [id number] DOB: 1940-04-27 Today's Date: 07/21/2019    History of Present Illness 79 y.o. male admitted on 07/09/19 for L sided weakness, facial droop, and slurred speech.  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  He did not get any tPA as he was outside of the window.  Pt intubated 5/6 for procedure and extubated in the AM of 07/10/19.  Pt with other significant PMH of sinus brady, scoliosis, prostate CA s/p surgery, HTN, dilated cardiomyopathy, CAD, CHF.  Patient re-intubated 5/10 with inability to clear secretions.. Extubated 5/11.     PT Comments    Patient in bed upon arrival and agreeable to participate in therapy. Pt able to transfer OOB with mod-max A and RW. Continue to progress as tolerated with anticipated d/c to SNF for further skilled PT services.     Follow Up Recommendations  SNF     Equipment Recommendations  Rolling walker with 5" wheels;Wheelchair (measurements PT);Wheelchair cushion (measurements PT);3in1 (PT)    Recommendations for Other Services Rehab consult     Precautions / Restrictions Precautions Precautions: Fall Precaution Comments: coretrack Restrictions Weight Bearing Restrictions: No    Mobility  Bed Mobility Overal bed mobility: Needs Assistance Bed Mobility: Supine to Sit   Sidelying to sit: +2 for physical assistance;Max assist       General bed mobility comments: tactile cues to sequence legs off of bed, +2 assist to engage trunk to sit up, stated he was dizzy upon sitting up, but was unable to answer any more questions in regards to dizziness as pt was more focused on his nose and the coretrack  Transfers Overall transfer level: Needs assistance Equipment used: Rolling walker (2 wheeled) Transfers: Sit to/from Stand Sit to Stand: Mod assist;Max assist Stand pivot transfers: Mod assist       General transfer comment: Able to stand  with walker with mod/max A of 1 and pivot to chair (increased cues for sequencing and hand placement), unaware of bowel incontinence upon standing requiring total A to for peri-care  Ambulation/Gait                 Stairs             Wheelchair Mobility    Modified Rankin (Stroke Patients Only) Modified Rankin (Stroke Patients Only) Pre-Morbid Rankin Score: No symptoms Modified Rankin: Moderately severe disability     Balance Overall balance assessment: Needs assistance Sitting-balance support: Feet supported Sitting balance-Leahy Scale: Fair     Standing balance support: Bilateral upper extremity supported Standing balance-Leahy Scale: Poor Standing balance comment: reliant on external support from AD                            Cognition Arousal/Alertness: Awake/alert Behavior During Therapy: WFL for tasks assessed/performed Overall Cognitive Status: Impaired/Different from baseline Area of Impairment: Orientation;Attention;Memory;Following commands;Safety/judgement;Awareness;Problem solving                 Orientation Level: Disoriented to;Time Current Attention Level: Sustained Memory: Decreased recall of precautions;Decreased short-term memory Following Commands: Follows one step commands with increased time Safety/Judgement: Decreased awareness of safety;Decreased awareness of deficits Awareness: Emergent Problem Solving: Decreased initiation;Difficulty sequencing;Requires verbal cues;Requires tactile cues;Slow processing General Comments: Pt continues to be distracted by coretrack, but was able to participate in more functional tasks in therapy to date with constant reminders to reorient to task  Exercises      General Comments        Pertinent Vitals/Pain Pain Assessment: Faces Faces Pain Scale: No hurt Pain Location: nose, generalized Pain Descriptors / Indicators: Discomfort;Grimacing Pain Intervention(s): Monitored  during session;Repositioned    Home Living                      Prior Function            PT Goals (current goals can now be found in the care plan section) Acute Rehab PT Goals Patient Stated Goal: pt unable to state Progress towards PT goals: Progressing toward goals    Frequency    Min 3X/week      PT Plan Current plan remains appropriate    Co-evaluation PT/OT/SLP Co-Evaluation/Treatment: Yes Reason for Co-Treatment: Complexity of the patient's impairments (multi-system involvement);Necessary to address cognition/behavior during functional activity;For patient/therapist safety;To address functional/ADL transfers PT goals addressed during session: Mobility/safety with mobility OT goals addressed during session: ADL's and self-care;Strengthening/ROM      AM-PAC PT "6 Clicks" Mobility   Outcome Measure  Help needed turning from your back to your side while in a flat bed without using bedrails?: A Lot Help needed moving from lying on your back to sitting on the side of a flat bed without using bedrails?: A Lot Help needed moving to and from a bed to a chair (including a wheelchair)?: A Lot Help needed standing up from a chair using your arms (e.g., wheelchair or bedside chair)?: A Lot Help needed to walk in hospital room?: A Lot Help needed climbing 3-5 steps with a railing? : Total 6 Click Score: 11    End of Session Equipment Utilized During Treatment: Gait belt Activity Tolerance: Patient tolerated treatment well Patient left: with call bell/phone within reach;in chair;with chair alarm set;Other (comment)(posey chair alarm belt) Nurse Communication: Mobility status PT Visit Diagnosis: Muscle weakness (generalized) (M62.81);Other abnormalities of gait and mobility (R26.89);Other symptoms and signs involving the nervous system (R29.898);Hemiplegia and hemiparesis Hemiplegia - Right/Left: Left Hemiplegia - dominant/non-dominant: Non-dominant Hemiplegia -  caused by: Cerebral infarction     Time: 0315-9458 PT Time Calculation (min) (ACUTE ONLY): 37 min  Charges:  $Gait Training: 8-22 mins                     Earney Navy, PTA Acute Rehabilitation Services Pager: [phone] Office: [phone]     Darliss Cheney 07/21/2019, 3:14 PM

## 2019-07-21 NOTE — Progress Notes (Signed)
Nutrition Follow-up  DOCUMENTATION CODES:   Not applicable  INTERVENTION:   -Calorie count per MD  -Magic cup TID with meals, each supplement provides 290 kcal and 9 grams of protein  -Transition pt to nocturnal feeding via Cortrak: Osmolite 1.5 cal @ 9ml/hr for 12 hours from 1800-0600.  24ml Pro-stat daily  126ml free water QID  Nocturnal tube feeding will provide 1090 kcals (meets 54% estimated needs), 56 grams protein (meets 56% estimated needs), 533ml free water (112ml total free water with flushes)   NUTRITION DIAGNOSIS:   Inadequate oral intake related to inability to eat as evidenced by NPO status.  Progressing, pt advanced to Dysphagia 1 diet with Honey Thick liquids.  GOAL:   Patient will meet greater than or equal to 90% of their needs  Progressing.   MONITOR:   TF tolerance, Labs  REASON FOR ASSESSMENT:   Consult Calorie Count  ASSESSMENT:   Pt with PMH of CHF, CAD, cardiomyopathy, hiatal hernia, HLD, and HTN now admitted with R CVA s/p thrombectomy and carotid stent.  5/6 admitted s/p IR, remained intubated post-op 5/7 extubated; cortrak placed  5/10 N/V with normal KUB; intubated 5/11 extubated 5/12 failed swallow eval; cortrak replaced as tube was inadvertently pulled with extubation 5/17 diet upgraded to dysphagia 1 with honey thick liquids  Discussed pt with RN.   Labs: CBGs 88-119 Medications reviewed and include: Colace, Novolog    Diet Order:   Diet Order            DIET - DYS 1 Room service appropriate? Yes; Fluid consistency: Honey Thick  Diet effective now              EDUCATION NEEDS:   No education needs have been identified at this time  Skin:  Skin Assessment: Skin Integrity Issues: Skin Integrity Issues:: Incisions, Other (Comment) Incisions: R groin Other: MASD buttocks  Last BM:  5/17  Height:   Ht Readings from Last 1 Encounters:  07/15/19 6\' 2"  (1.88 m)    Weight:   Wt Readings from Last 1  Encounters:  07/21/19 82.7 kg    BMI:  Body mass index is 23.41 kg/m.  Estimated Nutritional Needs:   Kcal:  2100-2300  Protein:  100-115 grams  Fluid:  >2 L/day    Larkin Ina, MS, RD, LDN RD pager number and weekend/on-call pager number located in [place].

## 2019-07-21 NOTE — TOC Progression Note (Cosign Needed Addendum)
Transition of Care [facility]) - Progression Note    Patient Details  Name: [name] MRN: [id number] Date of Birth: 1940/12/19  Transition of Care [facility]) CM/SW [place], [place] Work Phone Number: 07/21/2019, 11:01 AM  Clinical Narrative:    MSW Intern spoke to wife and they chose [facility]. Facility was notified and is following.  Addendum 3:07 05/18 [facility] noted they could not take pt due to facility having a positive covid. Pt's wife noted she would like a facility that is close so information was faxed out to [facility] Flock, SW following.  Expected Discharge Plan: [place] Barriers to Discharge: Continued Medical Work up  Expected Discharge Plan and Services Expected Discharge Plan: [place] arrangements for the past 2 months: Single Family Home                                       Social Determinants of Health (SDOH) Interventions    Readmission Risk Interventions No flowsheet data found.

## 2019-07-22 LAB — CBC
HCT: 34 % — ABNORMAL LOW (ref 39.0–52.0)
Hemoglobin: 11 g/dL — ABNORMAL LOW (ref 13.0–17.0)
MCH: 31.1 pg (ref 26.0–34.0)
MCHC: 32.4 g/dL (ref 30.0–36.0)
MCV: 96 fL (ref 80.0–100.0)
Platelets: 402 10*3/uL — ABNORMAL HIGH (ref 150–400)
RBC: 3.54 MIL/uL — ABNORMAL LOW (ref 4.22–5.81)
RDW: 13.8 % (ref 11.5–15.5)
WBC: 16.9 10*3/uL — ABNORMAL HIGH (ref 4.0–10.5)
nRBC: 0 % (ref 0.0–0.2)

## 2019-07-22 LAB — GLUCOSE, CAPILLARY
Glucose-Capillary: 124 mg/dL — ABNORMAL HIGH (ref 70–99)
Glucose-Capillary: 96 mg/dL (ref 70–99)
Glucose-Capillary: 139 mg/dL — ABNORMAL HIGH (ref 70–99)
Glucose-Capillary: 105 mg/dL — ABNORMAL HIGH (ref 70–99)
Glucose-Capillary: 123 mg/dL — ABNORMAL HIGH (ref 70–99)
Glucose-Capillary: 116 mg/dL — ABNORMAL HIGH (ref 70–99)

## 2019-07-22 LAB — BASIC METABOLIC PANEL
Anion gap: 11 (ref 5–15)
BUN: 31 mg/dL — ABNORMAL HIGH (ref 8–23)
CO2: 21 mmol/L — ABNORMAL LOW (ref 22–32)
Calcium: 8.5 mg/dL — ABNORMAL LOW (ref 8.9–10.3)
Chloride: 107 mmol/L (ref 98–111)
Creatinine, Ser: 1.4 mg/dL — ABNORMAL HIGH (ref 0.61–1.24)
GFR calc Af Amer: 55 mL/min — ABNORMAL LOW (ref >60)
GFR calc non Af Amer: 48 mL/min — ABNORMAL LOW (ref >60)
Glucose, Bld: 128 mg/dL — ABNORMAL HIGH (ref 70–99)
Potassium: 3.9 mmol/L (ref 3.5–5.1)
Sodium: 139 mmol/L (ref 135–145)

## 2019-07-22 MED ORDER — OSMOLITE 1.5 CAL PO LIQD
1000.0000 mL | ORAL | Status: DC
  Administered 2019-07-22 – 2019-07-24 (×3): 1000 mL
  Filled 2019-07-22 (×5): qty 1000

## 2019-07-22 NOTE — Consult Note (Signed)
   [facility] CM Inpatient Consult   07/22/2019  Stephen Copeland 1940/11/23 [id number]  LLOS: 12 days  Medicare  Patient screened for high risk score and length of stay for unplanned readmission hospitalization and 1st admission in 6 months.  Chart reviewed  to check if potential [place] Management services are needed.  Review of patient's medical record reveals patient is being recommended for a skilled nursing facility level of care with a Code Stroke.  Primary Care Provider is listed with Velazquez, Fransico Meadow., MD with [facility], is not a [facility] physician or provider. Also, patient with the [facility] in [place].  Patient is showing as [place] Patient however, his primary care is not the primary care provider listed in the [place]..  Will alert [facility] PAC RN regarding patient's primary is not in [facility], currently, if patient transitions to a [facility] affiliated facility.  Plan:  Continue to follow progress and disposition to assess for post hospital care management needs.    Please place a [facility] Care Management consult as appropriate and for questions contact:   Natividad Brood, RN BSN [facility] Liaison  [phone] business mobile phone Toll free office [phone]  Fax number: [phone] [email or web address]

## 2019-07-22 NOTE — Progress Notes (Signed)
Physical Therapy Treatment Patient Details Name: Stephen Copeland MRN: [id number] DOB: 1940-07-16 Today's Date: 07/22/2019    History of Present Illness 79 y.o. male admitted on 07/09/19 for L sided weakness, facial droop, and slurred speech.  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  He did not get any tPA as he was outside of the window.  Pt intubated 5/6 for procedure and extubated in the AM of 07/10/19.  Pt with other significant PMH of sinus brady, scoliosis, prostate CA s/p surgery, HTN, dilated cardiomyopathy, CAD, CHF.  Patient re-intubated 5/10 with inability to clear secretions.. Extubated 5/11.     PT Comments    Patient able to transfer OOB with +2 assist. Continue to progress as tolerated with anticipated d/c to SNF for further skilled PT services.     Follow Up Recommendations  SNF     Equipment Recommendations  Rolling walker with 5" wheels;Wheelchair (measurements PT);Wheelchair cushion (measurements PT);3in1 (PT)    Recommendations for Other Services Rehab consult     Precautions / Restrictions Precautions Precautions: Fall Precaution Comments: coretrack Restrictions Weight Bearing Restrictions: No    Mobility  Bed Mobility Overal bed mobility: Needs Assistance Bed Mobility: Supine to Sit     Supine to sit: +2 for physical assistance;Max assist     General bed mobility comments: cues for sequencing and assit to bring hips to EOB and to elevate trunk into sitting   Transfers Overall transfer level: Needs assistance Equipment used: Rolling walker (2 wheeled) Transfers: Sit to/from [facility] Sit to Stand: Mod assist;+2 safety/equipment Stand pivot transfers: Mod assist;+2 safety/equipment       General transfer comment: pt stood from EOB and BSC with mod A to power up and for balance in standing; pt with incontinence of stool upon standing from bed and able to pivot to [facility]; pt took a few steps forward with  RW from [facility] and recliner brought up behind him  Ambulation/Gait                 Stairs             Wheelchair Mobility    Modified Rankin (Stroke Patients Only) Modified Rankin (Stroke Patients Only) Pre-Morbid Rankin Score: No symptoms Modified Rankin: Moderately severe disability     Balance Overall balance assessment: Needs assistance Sitting-balance support: Feet supported Sitting balance-Leahy Scale: Fair     Standing balance support: Bilateral upper extremity supported Standing balance-Leahy Scale: Poor                              Cognition Arousal/Alertness: Awake/alert Behavior During Therapy: WFL for tasks assessed/performed Overall Cognitive Status: Impaired/Different from baseline Area of Impairment: Attention;Memory;Following commands;Safety/judgement;Awareness;Problem solving                   Current Attention Level: Sustained Memory: Decreased short-term memory Following Commands: Follows one step commands with increased time Safety/Judgement: Decreased awareness of safety;Decreased awareness of deficits Awareness: Emergent Problem Solving: Decreased initiation;Difficulty sequencing;Requires verbal cues;Requires tactile cues;Slow processing General Comments: perseverates on coretrack and "that nurse at [place] who put this wire in my nose"; easily distracted and tangential; participates in mobility when initiated by therapist      Exercises      General Comments        Pertinent Vitals/Pain Pain Assessment: Faces Faces Pain Scale: Hurts a little bit Pain Location: nose, generalized Pain Descriptors / Indicators:  Discomfort;Grimacing Pain Intervention(s): Monitored during session    Home Living                      Prior Function            PT Goals (current goals can now be found in the care plan section) Progress towards PT goals: Progressing toward goals    Frequency    Min  3X/week      PT Plan Current plan remains appropriate    Co-evaluation              AM-PAC PT "6 Clicks" Mobility   Outcome Measure  Help needed turning from your back to your side while in a flat bed without using bedrails?: A Lot Help needed moving from lying on your back to sitting on the side of a flat bed without using bedrails?: A Lot Help needed moving to and from a bed to a chair (including a wheelchair)?: A Lot Help needed standing up from a chair using your arms (e.g., wheelchair or bedside chair)?: A Lot Help needed to walk in hospital room?: A Lot Help needed climbing 3-5 steps with a railing? : Total 6 Click Score: 11    End of Session Equipment Utilized During Treatment: Gait belt Activity Tolerance: Patient tolerated treatment well Patient left: with call bell/phone within reach;in chair;with chair alarm set Nurse Communication: Mobility status PT Visit Diagnosis: Muscle weakness (generalized) (M62.81);Other abnormalities of gait and mobility (R26.89);Other symptoms and signs involving the nervous system (R29.898);Hemiplegia and hemiparesis Hemiplegia - Right/Left: Left Hemiplegia - dominant/non-dominant: Non-dominant Hemiplegia - caused by: Cerebral infarction     Time: 6606-0045 PT Time Calculation (min) (ACUTE ONLY): 40 min  Charges:  $Gait Training: 23-37 mins                     Earney Navy, PTA Acute Rehabilitation Services Pager: [phone] Office: [phone]     Darliss Cheney 07/22/2019, 5:11 PM

## 2019-07-22 NOTE — TOC Progression Note (Cosign Needed Addendum)
Transition of Care [facility]) - Progression Note    Patient Details  Name: Stephen Copeland MRN: [id number] Date of Birth: 1941/03/04  Transition of Care [facility]) CM/SW [place], [place] Work Phone Number: 07/22/2019, 12:05 PM  Clinical Narrative:    MSW Intern spoke to wife to update her that [facility] is unable to take pt. She noted that she would still like to be close, and [facility] is next closest. She wants pt's opinion before making a decision. MSW Intern attempted to go speak to pt, but he was sleeping, will attempt again at a later time.  Addendum 2:48 5/19 MSW Intern went to speak with pt about bed choices. He did not seem to understand at first, but then looked at the list. We discussed [place] being the closest option which was important to his wife. He then went on about how he needs a handicap buggy and talking about grocery shopping and putting stuff in his car. He asked for MSW Intern to call the [facility] to get him one, and he was advised that he would have to do that. After him not seeming interested in the conversation or understanding why I could not get him a buggy, MSW Intern called his wife to verify the choice. She said that that is just how he is and to keep trying to get a choice out of him. SW will follow for final choice.  Expected Discharge Plan: [place] Barriers to Discharge: Continued Medical Work up  Expected Discharge Plan and Services Expected Discharge Plan: [place] arrangements for the past 2 months: Single Family Home                                       Social Determinants of Health (SDOH) Interventions    Readmission Risk Interventions No flowsheet data found.

## 2019-07-22 NOTE — Progress Notes (Signed)
STROKE TEAM PROGRESS NOTE   INTERVAL HISTORY PT/OT at the bedside.  Patient sitting in chair, with dinner tray in front of him, however, he was not eating.  With ice cream put into his mouth, he was not swallowing.  Dietitian on board, patient not eating enough to meet daily needs, likely need to go back to continuous tube feeding.  May need to consider PEG tube for SNF placement.  Vitals:   07/22/19 0332 07/22/19 0415 07/22/19 0748 07/22/19 1141  BP:  (!) 147/71 125/64 125/61  Pulse:  87 100 87  Resp:  16 20 16   Temp:  97.9 F (36.6 C) (!) 100.5 F (38.1 C) 99 F (37.2 C)  TempSrc:  Oral Axillary Axillary  SpO2:  99% 97% 97%  Weight: 83.1 kg     Height:        CBC:  Recent Labs  Lab 07/21/19 0223 07/22/19 0611  WBC 11.7* 16.9*  HGB 10.9* 11.0*  HCT 34.4* 34.0*  MCV 97.7 96.0  PLT 386 402*    Basic Metabolic Panel:  Recent Labs  Lab 07/21/19 0223 07/22/19 0611  NA 144 139  K 4.0 3.9  CL 109 107  CO2 24 21*  GLUCOSE 110* 128*  BUN 39* 31*  CREATININE 1.50* 1.40*  CALCIUM 8.6* 8.5*    IMAGING past 24 hours No results found.   PHYSICAL EXAM   General -frail elderly malnourished looking Caucasian male who is not in distress  Ophthalmologic - fundi not visualized due to noncooperation.  Cardiovascular - Regular rhythm and rate,   Neurological Exam - Patient is awake alert and interactive.  And follows commands well.  Speech mildly dysarthric. Visual field full, no hemianopia. Gaze bilaterally without difficulty, PERRL. Tracking bilaterally. Left mild facial droop. Tongue midline. BUE 4/5. BLE 3-/5 proximal and 2/5 left foot DF/PF. DTR 1+ and no babinski. Sensation symmetrical. B FTN intact but slow. Gait not tested.    ASSESSMENT/PLAN [name] is a 79 y.o. male with history of Afib, systolic CHF, HLD, CAD, HTN, prostate cancer presenting with left sided weakness, facial droop, dysarthria.   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 embolic secondary to known AF even on Eliquis  Code Stroke CT head evolving R MCA territory infarct (insula, cerebral cortex). hyperdense R M2 +/- M3. ASPECTS 7    CTA head & neck +LVO. Probable ruptured plaque R ICA bifurcation w/ near occlusive stenosis, possible superimposed dissection. Distal reconstitution R M1 from collaterals but w/ distal embolic occlusion proximal R M2/3 superior division branch. L ICA bifurcation and siphon atherosclerosis. B VA origins 50% stenoses. R subclavian artery origin 60% stenosis.   CT perfusion 19cc core infarct R insula and posterior frontal. 33cc penumbra.   Cerebral angio TICI3 revascularization occluded middle trifurcation R MCA branch, revascularization R ICA occlusion w/ angioplasty and stenting.   Post IR CT contrast stain R putamen and moderate R sylvian fissure, SAH + contrast.    CT head R sylvian fissure SAH. Increased contrast vs hemorrhage R suprasellar cistern following ICA concerned for increased SAH. R sulci effacement.   Carotid Doppler  R ICA stent patent  MRI  Patchy R MCA infarcts. SAH R sylvian fissure. Small IVH.   MRA - Severely motion degraded head MRA. Persistent patency of the revascularized right ICA. No flow  limiting proximal stenosis identified.   CT head repeat 5/11 R insular infarct stable. Residual small SAH and small IVH.  CT head repeat 5/13 evolving R MCA infarct, SAH unchanged   2D Echo EF 35-40%. No source of embolus   LDL 90  HgbA1c 5.8  SCDs for VTE prophylaxis  Eliquis (apixaban) daily prior to admission, now on aspirin 81 mg daily and Brilinta (ticagrelor) 90 mg bid. Continue to hold [facility] given CT with continued SAH and IVH.   Therapy recommendations:  CIR - > pt w/o family support at home. Will need SNF  Disposition:  SW following w/ family for bed choice  Acute  hypoxemic respiratory failure, resolved Likely Aspiration PNA, treated  Extubated w/ CCM signing off 5/8  Vomiting x 5 with possible aspiration requiring NRB 5/10. CCM reconsulted. 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 Hold AC given post IR SAH and need for DAPT post stent placement. . Bradycardia resolved off sedation . Consider AC once SAH resolves.   Hypertension  Home meds:  Hydralazine 50 tid, metoprolol 25 bid  On Hydralazine 25 mg Q8 and metoprolol 12.5 bid . SBP 120s, on the low end  . Resume tube feeding continuously  . Long-term BP goal normotensive  Hyperlipidemia  Home meds:  No statin  LDL 90, goal < 70  May consider statin again ->pt adamantly has refused statins in the past  ARF on CKD, improving  Cre 1.55->1.50->1.40  Renal US R w/ increased echogenicity. No hydro  Urine NA neg  Leukocytosis   Temp 100.5  Leukocytosis WBC 11.7->16.9 (afebrile)   UA neg  CXR unremarkable  Continue monitoring  Chronic HFrEF Dilated Cardiomyopathy LBBB Prolonged QT  EF 30 to 40% since 2017  This admission EF 35 to 40%  CXR no pulmonary edema  Dysphagia . Secondary to stroke . SLP difficulty d/t pt participation. . Trial D1 puree w/ honey thick liquids -> pt not swallowing and ate about 10% only of daily needs . Back to continuous feeding . on free water 150 mL q6h . Encourage po intake . Calorie count - ongoing . Pt will likely need PEG before SNF placement . Speech and dietician on board  Other Stroke Risk  Factors  Advanced age  Coronary artery disease  Chronic systolic Congestive heart failure  Other Active Problems  GERD on PPI   BPH on flomax PTA  Prostate cancer   Hypokalemia - resolved  LBBB. Appears old. EKG neg ischemia. Trops 27->32  (Likely strain related)  2 small blood clot in loose stool, recurred last night. Hgb stable (10.9->11.0). Continue to monitor.  No indication for colonoscopy  Hospital Day #13   [name], MD PhD Stroke Neurology 07/22/2019 2:30 PM    To contact Stroke Continuity provider, please refer to [email or web address] After hours, contact General Neurology

## 2019-07-22 NOTE — Progress Notes (Signed)
Calorie Count Note  48 hour calorie count ordered.  Diet: Dysphagia 1 with Honey Thick Liquids Supplements: Magic Cup TID  Breakfast: 127 kcals, 4 grams protein Lunch: 93 kcals, 3.5 grams protein Dinner: 47 kcals, 2.5 grams protein  Supplements: Pt did not consume.   Total intake: 267 kcal (12% of minimum estimated needs)  10 grams protein (10% of minimum estimated needs)  Nutrition Dx: Inadequate oral intake related to inability to eat as evidenced by NPO status.  Progressing, pt advanced to Dysphagia 1 diet with Honey Thick liquids.  Goal: Patient will meet greater than or equal to 90% of their needs  Progressing.   Intervention:  -Continue Magic cup TID with meals, each supplement provides 290 kcal and 9 grams of protein -Continue nocturnal tube feeding via Cortrak: Osmolite 1.5 cal @ 28ml/hr for 12 hours from 1800-0600.  82ml Pro-stat daily  174ml free water QID  Nocturnal tube feeding will provide 1090 kcals (meets 54% estimated needs), 56 grams protein (meets 56% estimated needs), 553ml free water (1169ml total free water with flushes)  Of note, pt is currently not eating enough to meet his nutrition needs during the day. RD will continue the calorie count for another day, but it is likely that the patient will need to be returned to continuous tube feeding.   Larkin Ina, MS, RD, LDN RD pager number and weekend/on-call pager number located in [place].

## 2019-07-23 LAB — GLUCOSE, CAPILLARY
Glucose-Capillary: 121 mg/dL — ABNORMAL HIGH (ref 70–99)
Glucose-Capillary: 100 mg/dL — ABNORMAL HIGH (ref 70–99)
Glucose-Capillary: 138 mg/dL — ABNORMAL HIGH (ref 70–99)
Glucose-Capillary: 106 mg/dL — ABNORMAL HIGH (ref 70–99)
Glucose-Capillary: 111 mg/dL — ABNORMAL HIGH (ref 70–99)
Glucose-Capillary: 109 mg/dL — ABNORMAL HIGH (ref 70–99)

## 2019-07-23 LAB — CBC
HCT: 31.9 % — ABNORMAL LOW (ref 39.0–52.0)
Hemoglobin: 10.5 g/dL — ABNORMAL LOW (ref 13.0–17.0)
MCH: 31.3 pg (ref 26.0–34.0)
MCHC: 32.9 g/dL (ref 30.0–36.0)
MCV: 94.9 fL (ref 80.0–100.0)
Platelets: 411 10*3/uL — ABNORMAL HIGH (ref 150–400)
RBC: 3.36 MIL/uL — ABNORMAL LOW (ref 4.22–5.81)
RDW: 13.8 % (ref 11.5–15.5)
WBC: 10.5 10*3/uL (ref 4.0–10.5)
nRBC: 0 % (ref 0.0–0.2)

## 2019-07-23 LAB — BASIC METABOLIC PANEL
Anion gap: 12 (ref 5–15)
BUN: 27 mg/dL — ABNORMAL HIGH (ref 8–23)
CO2: 20 mmol/L — ABNORMAL LOW (ref 22–32)
Calcium: 8.3 mg/dL — ABNORMAL LOW (ref 8.9–10.3)
Chloride: 106 mmol/L (ref 98–111)
Creatinine, Ser: 1.37 mg/dL — ABNORMAL HIGH (ref 0.61–1.24)
GFR calc Af Amer: 57 mL/min — ABNORMAL LOW (ref >60)
GFR calc non Af Amer: 49 mL/min — ABNORMAL LOW (ref >60)
Glucose, Bld: 115 mg/dL — ABNORMAL HIGH (ref 70–99)
Potassium: 3.3 mmol/L — ABNORMAL LOW (ref 3.5–5.1)
Sodium: 138 mmol/L (ref 135–145)

## 2019-07-23 MED ORDER — FREE WATER
150.0000 mL | Status: DC
  Administered 2019-07-23 – 2019-07-24 (×9): 150 mL

## 2019-07-23 MED ORDER — GERHARDT'S BUTT CREAM
TOPICAL_CREAM | Freq: Three times a day (TID) | CUTANEOUS | Status: DC | PRN
  Filled 2019-07-23 (×3): qty 1

## 2019-07-23 MED ORDER — LOPERAMIDE HCL 1 MG/7.5ML PO SUSP
2.0000 mg | Freq: Four times a day (QID) | ORAL | Status: DC | PRN
  Administered 2019-07-23 (×3): 2 mg via ORAL
  Filled 2019-07-23 (×4): qty 15

## 2019-07-23 NOTE — Progress Notes (Signed)
STROKE TEAM PROGRESS NOTE   INTERVAL HISTORY Pt lying in bed, RN at bedside. Pt still not eating much, speech therapist and dietitian felt the same. I talked with pt regarding PEG tube, he seems OK with it. I also talked with wife over the phone and she requested to talk to pt over the phone. I called her back later, she still can not decide and she said she will come over tomorrow in the pm to discuss with pt face to face. Now back to continuous tube feeding.    Vitals:   07/23/19 0319 07/23/19 0323 07/23/19 0743 07/23/19 0852  BP: 123/61  125/64 137/72  Pulse: 81  85 96  Resp: 18  18   Temp: 98.3 F (36.8 C)  99.4 F (37.4 C) 99.4 F (37.4 C)  TempSrc: Oral  Axillary Axillary  SpO2: 98%  93% 94%  Weight:  85.3 kg    Height:        CBC:  Recent Labs  Lab 07/22/19 0611 07/23/19 0853  WBC 16.9* 10.5  HGB 11.0* 10.5*  HCT 34.0* 31.9*  MCV 96.0 94.9  PLT 402* 411*    Basic Metabolic Panel:  Recent Labs  Lab 07/22/19 0611 07/23/19 0853  NA 139 138  K 3.9 3.3*  CL 107 106  CO2 21* 20*  GLUCOSE 128* 115*  BUN 31* 27*  CREATININE 1.40* 1.37*  CALCIUM 8.5* 8.3*    IMAGING past 24 hours No results found.   PHYSICAL EXAM   General -frail elderly malnourished looking Caucasian male who is not in distress  Ophthalmologic - fundi not visualized due to noncooperation.  Cardiovascular - Regular rhythm and rate,   Neurological Exam - Patient is awake alert and interactive.  And follows commands well.  Speech mildly dysarthric. Visual field full, no hemianopia. Gaze bilaterally without difficulty, PERRL. Tracking bilaterally. Left mild facial droop. Tongue midline. BUE 4/5. BLE 3-/5 proximal and 2/5 left foot DF/PF. DTR 1+ and no babinski. Sensation symmetrical. B FTN intact but slow. Gait not tested.    ASSESSMENT/PLAN Stephen Copeland is a 79 y.o. male with history of Afib, systolic CHF, HLD, CAD, HTN, prostate cancer presenting with left sided weakness, facial droop,  dysarthria.   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 embolic secondary to known AF even on Eliquis  Code Stroke CT head evolving R MCA territory infarct (insula, cerebral cortex). hyperdense R M2 +/- M3. ASPECTS 7    CTA head & neck +LVO. Probable ruptured plaque R ICA bifurcation w/ near occlusive stenosis, possible superimposed dissection. Distal reconstitution R M1 from collaterals but w/ distal embolic occlusion proximal R M2/3 superior division branch. L ICA bifurcation and siphon atherosclerosis. B VA origins 50% stenoses. R subclavian artery origin 60% stenosis.   CT perfusion 19cc core infarct R insula and posterior frontal. 33cc penumbra.   Cerebral angio TICI3 revascularization occluded middle trifurcation R MCA branch, revascularization R ICA occlusion w/ angioplasty and stenting.   Post IR CT contrast stain R putamen and moderate R sylvian fissure, SAH + contrast.    CT head R sylvian fissure SAH. Increased contrast vs hemorrhage R suprasellar cistern following ICA concerned for increased SAH. R sulci effacement.   Carotid Doppler  R ICA stent patent  MRI  Patchy R MCA infarcts. SAH R sylvian fissure. Small  IVH.   MRA - Severely motion degraded head MRA. Persistent patency of the revascularized right ICA. No flow limiting proximal stenosis identified.   CT head repeat 5/11 R insular infarct stable. Residual small SAH and small IVH.  CT head repeat 5/13 evolving R MCA infarct, SAH unchanged   2D Echo EF 35-40%. No source of embolus   LDL 90  HgbA1c 5.8  SCDs for VTE prophylaxis  Eliquis (apixaban) daily prior to admission, now on aspirin 81 mg daily and Brilinta (ticagrelor) 90 mg bid. Continue to hold [facility] given CT with continued SAH and IVH. Consider to repeat CT next week and decide on [facility]  Therapy recommendations:  CIR -  > pt w/o family support at home. Will need SNF  Disposition:  SW following w/ family for bed choice  Acute hypoxemic respiratory failure, resolved Likely Aspiration PNA, treated  Extubated w/ CCM signing off 5/8  Vomiting x 5 with possible aspiration requiring NRB 5/10. CCM reconsulted. 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 Hold AC given post IR SAH and need for DAPT post stent placement. . Bradycardia resolved off sedation . Consider AC once SAH resolves, will consider to repeat CT next week.   Hypertension  Home meds:  Hydralazine 50 tid, metoprolol 25 bid  On Hydralazine 25 mg Q8 and metoprolol 12.5 bid . SBP 120s, on the low end  . Resume tube feeding continuously  . Long-term BP goal normotensive  Hyperlipidemia  Home meds:  No statin  LDL 90, goal < 70  May consider statin again ->pt adamantly has refused statins in the past  AKI on CKD, improving  Cre 1.55->1.50->1.40->1.37  Renal US R w/ increased echogenicity. 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 Secondary to stroke . SLP difficulty d/t pt participation. . Trial D1 puree w/ honey thick liquids -> pt not swallowing and eat enough for daily needs -> Back to continuous feeding . on free  water 150 mL q6h . Encourage po intake . Calorie count - ongoing . Pt will likely need PEG before SNF placement . Speech and dietician on board  Other Stroke Risk Factors  Advanced age  Coronary artery disease  Chronic systolic Congestive heart failure  Other Active Problems  GERD on PPI   BPH on flomax PTA  Prostate cancer   Hypokalemia - resolved  LBBB. Appears old. EKG neg ischemia. Trops 27->32  (Likely strain related)  small blood clots in loose stool with recurrence. Hgb stable (10.9->11.0->10.5). Continue to monitor.  No indication for colonoscopy  Loose stool on imodium PRN  Hospital Day #14   I had long discussion with wife over the phone, updated pt current condition, treatment plan and potential prognosis, and answered all the questions. I also  discussed with her about PEG tube and she will come tomorrow to talk to pt face to face. She expressed understanding and appreciation.    [name], MD PhD Stroke Neurology 07/23/2019 11:52 AM    To contact Stroke Continuity provider, please refer to [email or web address] After hours, contact General Neurology

## 2019-07-23 NOTE — Progress Notes (Signed)
  Speech Language Pathology Treatment: Dysphagia;Cognitive-Linquistic  Patient Details Name: Stephen Copeland MRN: [id number] DOB: 04/06/1940 Today's Date: 07/23/2019 Time: 6384-5364 SLP Time Calculation (min) (ACUTE ONLY): 19 min  Assessment / Plan / Recommendation Clinical Impression  Discussed pt with MD, intake remains almost non existent so there is concern about long term nutrition needs. Today pt is alert, clinging to unconnected yankauer, trying to suction oral secretions. Pt is still extremely verbose. SLP provided max cueing to stop talking, focus attention to ice chip trials, with pt orally manipulate with a labial seal and swallow response x2, but lead to prolonged hard coughing. Transitioned to teaspoons of honey thick water, which pt accepted, but nectar achieved a swallow response despite max cueing. He reported, "its like ive lost the automatic response to swallow." SLP attempted to facilitate posterior transit and further trials to elicit swallow response, but pt refused and continued to perseverate on all his other health concerns and complaints about past and present providers. His cognition and severe dysphagia prevent meaningful oral intake. Cannot even consider upgrading texture for increased pleasure because pt is not capable of orally manipulating them or swallowing without hard coughing. Suggest discussion with palliative care as pt frequently mentions that his is tired of it all and wants to stop all these interventions.   HPI        SLP Plan  Continue with current plan of care       Recommendations  Diet recommendations: Honey-thick liquid;Dysphagia 1 (puree) Liquids provided via: Teaspoon Medication Administration: Via alternative means Supervision: Full supervision/cueing for compensatory strategies;Trained caregiver to feed patient Compensations: Slow rate;Small sips/bites;Minimize environmental distractions                Follow up Recommendations: Skilled  Nursing facility SLP Visit Diagnosis: Dysphagia, unspecified (R13.10);Cognitive communication deficit (W80.321) Plan: Continue with current plan of care       GO               Stephen Baltimore, MA [place] Pager [phone] Office [phone]   Lynann Beaver 07/23/2019, 11:12 AM

## 2019-07-23 NOTE — Plan of Care (Signed)
  Problem: Education: Goal: Knowledge of General Education information will improve Description: Including pain rating scale, medication(s)/side effects and non-pharmacologic comfort measures Outcome: Progressing   Problem: Coping: Goal: Level of anxiety will decrease Outcome: Progressing   Problem: Safety: Goal: Ability to remain free from injury will improve Outcome: Progressing   

## 2019-07-23 NOTE — TOC Progression Note (Addendum)
Transition of Care [facility]) - Progression Note    Patient Details  Name: Stephen Copeland MRN: [id number] Date of Birth: [birth year]  [facility] CM/SW [place], [place] Phone Number: 07/23/2019, 4:25 PM  Clinical Narrative:     Pt stated he is fine going to [facility] for rehab. CSW called pts wife twice to confim but no answer.  CSW will reach out to [facility] when pt is medically  ready to discharge.   Expected Discharge Plan: [place] Barriers to Discharge: Continued Medical Work up  Expected Discharge Plan and Services Expected Discharge Plan: [place] arrangements for the past 2 months: Single Family Home                                       Social Determinants of Health (SDOH) Interventions    Readmission Risk Interventions No flowsheet data found.  Emeterio Reeve, Latanya Presser, [place] Social Worker [phone]

## 2019-07-23 NOTE — Progress Notes (Signed)
Calorie Count Note  48 hour calorie count ordered.  Diet: Dysphagia 1 with Honey Thick Liquids Supplements: Magic Cup TID  Day 1 Results  Breakfast: 127 kcals, 4 grams protein Lunch: 93 kcals, 3.5 grams protein Dinner: 47 kcals, 2.5 grams protein  Supplements: Pt did not consume.   Total intake: 267 kcal (12% of minimum estimated needs)  10 grams protein (10% of minimum estimated needs)   Day 2 Results Breakfast: 0 kcals, 0 grams protein Lunch: No meal information available Dinner: No meal information available Supplements: None consumed  Total intake: 0 kcal (0% of minimum estimated needs)  0 protein (0% of minimum estimated needs)  Of note, pt ate only bites of his breakfast on 5/20.   Nutrition Dx: Inadequate oral intakerelated to inability to eatas evidenced by NPO status.  Progressing, pt advanced to Dysphagia 1 diet with Honey Thick liquids.   Goal: Patient will meet greater than or equal to 90% of their needs  Progressing.   Intervention: Pt is not consuming enough po to meet needs. Recommend returning pt to continuous feeding. Pt will likely need PEG tube to go to SNF given poor intake.   Change tube feeding to: Osmolite 1.5 @ 60 ml/hr via Cortrak tube 30 ml Prostat daily 169ml free water Q4H  Provides: 2260 kcal, 105 grams protein, and 1094 ml free water (1930ml total free water with flushes).     Larkin Ina, MS, RD, LDN RD pager number and weekend/on-call pager number located in [place].

## 2019-07-24 ENCOUNTER — Inpatient Hospital Stay (HOSPITAL_COMMUNITY): Payer: Medicare Other

## 2019-07-24 ENCOUNTER — Other Ambulatory Visit: Payer: Self-pay | Admitting: Cardiovascular Disease

## 2019-07-24 LAB — GLUCOSE, CAPILLARY
Glucose-Capillary: 112 mg/dL — ABNORMAL HIGH (ref 70–99)
Glucose-Capillary: 124 mg/dL — ABNORMAL HIGH (ref 70–99)
Glucose-Capillary: 123 mg/dL — ABNORMAL HIGH (ref 70–99)
Glucose-Capillary: 137 mg/dL — ABNORMAL HIGH (ref 70–99)
Glucose-Capillary: 117 mg/dL — ABNORMAL HIGH (ref 70–99)
Glucose-Capillary: 132 mg/dL — ABNORMAL HIGH (ref 70–99)

## 2019-07-24 LAB — BASIC METABOLIC PANEL
Anion gap: 9 (ref 5–15)
BUN: 25 mg/dL — ABNORMAL HIGH (ref 8–23)
CO2: 25 mmol/L (ref 22–32)
Calcium: 8.5 mg/dL — ABNORMAL LOW (ref 8.9–10.3)
Chloride: 107 mmol/L (ref 98–111)
Creatinine, Ser: 1.34 mg/dL — ABNORMAL HIGH (ref 0.61–1.24)
GFR calc Af Amer: 58 mL/min — ABNORMAL LOW (ref >60)
GFR calc non Af Amer: 50 mL/min — ABNORMAL LOW (ref >60)
Glucose, Bld: 118 mg/dL — ABNORMAL HIGH (ref 70–99)
Potassium: 3.4 mmol/L — ABNORMAL LOW (ref 3.5–5.1)
Sodium: 141 mmol/L (ref 135–145)

## 2019-07-24 LAB — BLOOD GAS, ARTERIAL
Acid-Base Excess: 0.7 mmol/L (ref 0.0–2.0)
Bicarbonate: 24 mmol/L (ref 20.0–28.0)
FIO2: 28
O2 Saturation: 96.8 %
Patient temperature: 36.9
pCO2 arterial: 33.7 mmHg (ref 32.0–48.0)
pH, Arterial: 7.467 — ABNORMAL HIGH (ref 7.350–7.450)
pO2, Arterial: 82.8 mmHg — ABNORMAL LOW (ref 83.0–108.0)

## 2019-07-24 LAB — CBC
HCT: 34.6 % — ABNORMAL LOW (ref 39.0–52.0)
Hemoglobin: 11 g/dL — ABNORMAL LOW (ref 13.0–17.0)
MCH: 30.8 pg (ref 26.0–34.0)
MCHC: 31.8 g/dL (ref 30.0–36.0)
MCV: 96.9 fL (ref 80.0–100.0)
Platelets: 460 10*3/uL — ABNORMAL HIGH (ref 150–400)
RBC: 3.57 MIL/uL — ABNORMAL LOW (ref 4.22–5.81)
RDW: 13.8 % (ref 11.5–15.5)
WBC: 13.9 10*3/uL — ABNORMAL HIGH (ref 4.0–10.5)
nRBC: 0 % (ref 0.0–0.2)

## 2019-07-24 MED ORDER — LOPERAMIDE HCL 1 MG/7.5ML PO SUSP
2.0000 mg | Freq: Four times a day (QID) | ORAL | Status: DC
  Administered 2019-07-24 – 2019-07-25 (×2): 2 mg via ORAL
  Filled 2019-07-24 (×4): qty 15

## 2019-07-24 MED ORDER — GUAIFENESIN 100 MG/5ML PO SOLN
15.0000 mL | ORAL | Status: DC | PRN
  Administered 2019-07-24 – 2019-08-26 (×19): 300 mg
  Filled 2019-07-24 (×2): qty 15
  Filled 2019-07-24: qty 10
  Filled 2019-07-24 (×2): qty 5
  Filled 2019-07-24 (×3): qty 15
  Filled 2019-07-24: qty 5
  Filled 2019-07-24 (×2): qty 15
  Filled 2019-07-24: qty 10
  Filled 2019-07-24: qty 15
  Filled 2019-07-24: qty 10
  Filled 2019-07-24 (×3): qty 15
  Filled 2019-07-24 (×2): qty 5
  Filled 2019-07-24 (×2): qty 10
  Filled 2019-07-24: qty 15
  Filled 2019-07-24: qty 10

## 2019-07-24 MED ORDER — GUAIFENESIN 100 MG/5ML PO SOLN
15.0000 mL | ORAL | Status: DC | PRN
  Filled 2019-07-24: qty 10
  Filled 2019-07-24: qty 5

## 2019-07-24 NOTE — Telephone Encounter (Signed)
Pt is currently admitted with new CVA. Anticoagulation on hold at this time per notes as he is taking ASA and Brillanta; so will await to send refill as the pt may be going to SNF at discharge.   Eliquis 5mg  refill request received. Patient is 79 years old, weight-87.2kg, Crea-1.34 on 07/24/2019, Diagnosis-Afib, and last seen by Ermalinda Barrios on 04/14/2019. Dose is appropriate based on dosing criteria but pt currently hospitalized so will not send refill at this time.

## 2019-07-24 NOTE — Progress Notes (Signed)
RT called to bedside by Nurse to assess pt for mucus plug. Patient stats are at 98% on 2L [place]. Patient congested, but can produce mucus with a strong cough. Pt concerned and states over and over wanting to know if he has Pneumonia. RT consulted with physician to have an order for a CXR, ABG, and Mucinex PRN. CXR has improved from the 5/16 CXR. RT drew ABG and is waiting on results. Pt stable and anxious.

## 2019-07-24 NOTE — Progress Notes (Signed)
Worsening SOB. Respiratory therapy notes prior episodes of respiratory compromise that had required intubation in the past. The patient is felt by respiratory to possibly have a PNA, with mucus plug also possible. Guaifenesin dose is being increased. STAT CXR and ABG are also being obtained.   Electronically signed: Dr. Kerney Elbe

## 2019-07-24 NOTE — Progress Notes (Signed)
Physical Therapy Treatment Patient Details Name: Stephen Copeland MRN: [id number] DOB: 30-Aug-1940 Today's Date: 07/24/2019    History of Present Illness 79 y.o. male admitted on 07/09/19 for L sided weakness, facial droop, and slurred speech.  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  He did not get any tPA as he was outside of the window.  Pt intubated 5/6 for procedure and extubated in the AM of 07/10/19.  Pt with other significant PMH of sinus brady, scoliosis, prostate CA s/p surgery, HTN, dilated cardiomyopathy, CAD, CHF.  Patient re-intubated 5/10 with inability to clear secretions.. Extubated 5/11.     PT Comments    On arrival to room pt laying in urine soaked chuck pads. He declined treatment, however with encouragement he was agreeable to linen change. Max A +2 for all bed mobilities. When attempting to assist pt to come to EOB, pt became resistive to movement and very vocal that he did not wish to do any more with therapy. Pt returned to supine, scooted up in bed and bed placed in chair position. Will continue to follow acutely for mobility progressions.     Follow Up Recommendations  SNF     Equipment Recommendations  Rolling walker with 5" wheels;Wheelchair (measurements PT);Wheelchair cushion (measurements PT);3in1 (PT)    Recommendations for Other Services       Precautions / Restrictions Precautions Precautions: Fall Precaution Comments: coretrack Restrictions Weight Bearing Restrictions: No    Mobility  Bed Mobility Overal bed mobility: Needs Assistance Bed Mobility: Rolling Rolling: Max assist;+2 for physical assistance         General bed mobility comments: max A for rolling to remove bed pads and scoot up in bed. Attempted suping>sit via helicopter technique, however pt resistive to movement and unwilling to participate.  Transfers                 General transfer comment: pt declining any attempt at  transfer.  Ambulation/Gait             General Gait Details: unable at this time.    Stairs             Wheelchair Mobility    Modified Rankin (Stroke Patients Only) Modified Rankin (Stroke Patients Only) Pre-Morbid Rankin Score: No symptoms Modified Rankin: Moderately severe disability     Balance                                            Cognition Arousal/Alertness: Awake/alert Behavior During Therapy: WFL for tasks assessed/performed Overall Cognitive Status: Impaired/Different from baseline Area of Impairment: Attention;Memory;Following commands;Safety/judgement;Awareness;Problem solving                   Current Attention Level: Sustained Memory: Decreased short-term memory Following Commands: Follows one step commands with increased time Safety/Judgement: Decreased awareness of safety;Decreased awareness of deficits Awareness: Emergent Problem Solving: Decreased initiation;Difficulty sequencing;Requires verbal cues;Requires tactile cues;Slow processing General Comments: Pt resistive to therapy today. Needed encouragement to participate in linen change secondary to incontinence of urine.      Exercises      General Comments General comments (skin integrity, edema, etc.): Wife present and wanting therapy to move pt, however advised wife we could not get him out of bed if he is resistive.      Pertinent Vitals/Pain Pain Assessment: No/denies pain  Home Living                      Prior Function            PT Goals (current goals can now be found in the care plan section) Acute Rehab PT Goals Patient Stated Goal: pt unable to state PT Goal Formulation: With patient Time For Goal Achievement: 07/24/19 Potential to Achieve Goals: Good Progress towards PT goals: Not progressing toward goals - comment(resistive today)    Frequency    Min 3X/week      PT Plan Current plan remains appropriate     Co-evaluation              AM-PAC PT "6 Clicks" Mobility   Outcome Measure  Help needed turning from your back to your side while in a flat bed without using bedrails?: A Lot Help needed moving from lying on your back to sitting on the side of a flat bed without using bedrails?: A Lot Help needed moving to and from a bed to a chair (including a wheelchair)?: A Lot Help needed standing up from a chair using your arms (e.g., wheelchair or bedside chair)?: A Lot Help needed to walk in hospital room?: A Lot Help needed climbing 3-5 steps with a railing? : Total 6 Click Score: 11    End of Session   Activity Tolerance: Other (comment)(pt resistive today) Patient left: with call bell/phone within reach;in bed;with bed alarm set Nurse Communication: Mobility status;Other (comment)(peri irritation) PT Visit Diagnosis: Muscle weakness (generalized) (M62.81);Other abnormalities of gait and mobility (R26.89);Other symptoms and signs involving the nervous system (R29.898);Hemiplegia and hemiparesis Hemiplegia - Right/Left: Left Hemiplegia - dominant/non-dominant: Non-dominant Hemiplegia - caused by: Cerebral infarction     Time: 9924-2683 PT Time Calculation (min) (ACUTE ONLY): 21 min  Charges:  $Therapeutic Activity: 8-22 mins           Benjiman Core, [place] Pager [phone] Acute Rehab   Allena Katz 07/24/2019, 3:00 PM

## 2019-07-24 NOTE — Progress Notes (Signed)
Patient was coughing a lot, seemed congested, c/o SOB, SAT 97%  With 2L o2, paged oncall Neurology Dr. Cheral Marker , RT seen him, ABG gas normal, CXR done, given prn mucinex, seems stable at this moment , and will continue to monitor closely per MD.

## 2019-07-24 NOTE — Progress Notes (Signed)
STROKE TEAM PROGRESS NOTE   INTERVAL HISTORY Pt wife at bedside, he was working with PT/OT on my arrival, so I told wife and pt that I will be back to talk to them. Then when I came back, wife has just gone. I called wife and spent a significant amount of time with her over the phone and eventually she is agreeable with the PEG. Will call surgery consult in am.    Vitals:   07/24/19 0807 07/24/19 1006 07/24/19 1128 07/24/19 1208  BP: (!) 146/87 (!) 141/73 112/64 131/67  Pulse: (!) 102 81 73 76  Resp: 18 20 18 18   Temp: 98.2 F (36.8 C) 98.8 F (37.1 C) 98.6 F (37 C) 99.2 F (37.3 C)  TempSrc: Axillary Oral  Oral  SpO2: 95% 93% 96% 97%  Weight:      Height:        CBC:  Recent Labs  Lab 07/23/19 0853 07/24/19 0333  WBC 10.5 13.9*  HGB 10.5* 11.0*  HCT 31.9* 34.6*  MCV 94.9 96.9  PLT 411* 460*    Basic Metabolic Panel:  Recent Labs  Lab 07/23/19 0853 07/24/19 0333  NA 138 141  K 3.3* 3.4*  CL 106 107  CO2 20* 25  GLUCOSE 115* 118*  BUN 27* 25*  CREATININE 1.37* 1.34*  CALCIUM 8.3* 8.5*    IMAGING past 24 hours DG CHEST PORT 1 VIEW  Result Date: 07/24/2019 CLINICAL DATA:  Shortness of breath EXAM: PORTABLE CHEST 1 VIEW COMPARISON:  07/19/2019, 12/18/2016, 07/14/2019 FINDINGS: Esophageal tube tip below the diaphragm but incompletely visualized. The right lung is grossly clear. Slightly improved aeration at left base with residual atelectasis medially. Enlarged cardiomediastinal silhouette with aortic atherosclerosis. No pneumothorax. IMPRESSION: Slightly improved aeration at left lung base with residual atelectasis or infiltrate medially. Cardiomegaly. Electronically Signed   By: Donavan Foil M.D.   On: 07/24/2019 03:36     PHYSICAL EXAM   General -frail elderly malnourished looking Caucasian male who is not in distress  Ophthalmologic - fundi not visualized due to noncooperation.  Cardiovascular - Regular rhythm and rate,   Neurological Exam - Patient is  awake alert and interactive.  And follows commands well.  Speech mildly dysarthric. Visual field full, no hemianopia. Gaze bilaterally without difficulty, PERRL. Tracking bilaterally. Left mild facial droop. Tongue midline. BUE 4/5. BLE 3-/5 proximal and 2/5 left foot DF/PF. DTR 1+ and no babinski. Sensation symmetrical. B FTN intact but slow. Gait not tested.    ASSESSMENT/PLAN [name] is a 79 y.o. male with history of Afib, systolic CHF, HLD, CAD, HTN, prostate cancer presenting with left sided weakness, facial droop, dysarthria.   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 embolic secondary to known AF even on Eliquis  Code Stroke CT head evolving R MCA territory infarct (insula, cerebral cortex). hyperdense R M2 +/- M3. ASPECTS 7    CTA head & neck +LVO. Probable ruptured plaque R ICA bifurcation w/ near occlusive stenosis, possible superimposed dissection. Distal reconstitution R M1 from collaterals but w/ distal embolic occlusion proximal R M2/3 superior division branch. L ICA bifurcation and siphon atherosclerosis. B VA origins 50% stenoses. R subclavian artery origin 60% stenosis.   CT perfusion 19cc core infarct R insula and posterior frontal. 33cc penumbra.   Cerebral angio TICI3 revascularization occluded middle trifurcation  R MCA branch, revascularization R ICA occlusion w/ angioplasty and stenting.   Post IR CT contrast stain R putamen and moderate R sylvian fissure, SAH + contrast.    CT head R sylvian fissure SAH. Increased contrast vs hemorrhage R suprasellar cistern following ICA concerned for increased SAH. R sulci effacement.   Carotid Doppler  R ICA stent patent  MRI  Patchy R MCA infarcts. SAH R sylvian fissure. Small IVH.   MRA - Severely motion degraded head MRA. Persistent patency of the revascularized right ICA. No  flow limiting proximal stenosis identified.   CT head repeat 5/11 R insular infarct stable. Residual small SAH and small IVH.  CT head repeat 5/13 evolving R MCA infarct, SAH unchanged   2D Echo EF 35-40%. No source of embolus   LDL 90  HgbA1c 5.8  SCDs for VTE prophylaxis  Eliquis (apixaban) daily prior to admission, now on aspirin 81 mg daily and Brilinta (ticagrelor) 90 mg bid. Continue to hold [facility] given CT with continued SAH and IVH. Consider to repeat CT next week and decide on [facility]  Therapy recommendations:  CIR - > pt w/o family support at home. Will need SNF  Disposition:  SW following w/ family for bed choice  Acute hypoxemic respiratory failure, resolved Likely Aspiration PNA, treated  Extubated w/ CCM signing off 5/8  Vomiting x 5 with possible aspiration requiring NRB 5/10. CCM reconsulted. Transferred to ICU and Intubated  Extubated 5/11 w/o difficulties  Unasyn 5/10>>5/17 for 7 days  Leukocytosis WBC 13.9 (afebrile)   CXR - slight improvement aeration LLL w/ atx/infiltrate. Cardiomegaly. 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 Hold AC given post IR SAH and need for DAPT post stent placement. . Bradycardia resolved off sedation . Consider AC once SAH resolves, will consider to repeat CT next week.   Hypertension  Home meds:  Hydralazine 50 tid, metoprolol 25 bid  On Hydralazine 25 mg Q8 and metoprolol 12.5 bid . SBP 120s, on the low end  . Resume tube feeding continuously  . Long-term BP goal normotensive  Hyperlipidemia  Home meds:  No statin  LDL 90, goal < 70  May consider statin again ->pt adamantly has refused statins in the  past  AKI on CKD, improving  Cre 1.55->1.50->1.40->1.37->1.34  Renal US R w/ increased echogenicity. 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 Secondary to stroke . SLP difficulty d/t pt participation. . Trial D1 puree w/ honey thick liquids -> pt not swallowing and eat enough for daily needs -> Back to continuous feeding . on free water 150 mL q4h . Encourage po intake . Discussed with wife in length, and she now is agreeable for PEG. Will consult surgery in am . Speech and dietician on board  Other Stroke Risk Factors  Advanced age  Coronary artery disease  Chronic systolic Congestive  heart failure  Other Active Problems  GERD on PPI   BPH on flomax PTA  Prostate cancer   Hypokalemia - resolved  LBBB. Appears old. EKG neg ischemia. Trops 27->32  (Likely strain related)  small blood clots in loose stool with recurrence. Hgb stable (10.9->11.0->10.5->11.0). Continue to monitor.  No indication for colonoscopy  Loose stool on imodium scheduled  Hospital Day #15   I had long discussion with wife again over the phone, updated pt current condition, treatment plan and potential prognosis, and answered all the questions. I also discussed with her about PEG tube and she eventually in agreement. She expressed understanding and appreciation.   [name], MD PhD Stroke Neurology 07/24/2019 2:08 PM    To contact Stroke Continuity provider, please refer to [email or web address] After hours, contact General Neurology

## 2019-07-25 ENCOUNTER — Inpatient Hospital Stay (HOSPITAL_COMMUNITY): Payer: Medicare Other

## 2019-07-25 DIAGNOSIS — R1311 Dysphagia, oral phase: Secondary | ICD-10-CM

## 2019-07-25 DIAGNOSIS — R1115 Cyclical vomiting syndrome unrelated to migraine: Secondary | ICD-10-CM

## 2019-07-25 DIAGNOSIS — K909 Intestinal malabsorption, unspecified: Secondary | ICD-10-CM

## 2019-07-25 DIAGNOSIS — R197 Diarrhea, unspecified: Secondary | ICD-10-CM

## 2019-07-25 LAB — CBC
HCT: 34.8 % — ABNORMAL LOW (ref 39.0–52.0)
Hemoglobin: 11.3 g/dL — ABNORMAL LOW (ref 13.0–17.0)
MCH: 31.1 pg (ref 26.0–34.0)
MCHC: 32.5 g/dL (ref 30.0–36.0)
MCV: 95.9 fL (ref 80.0–100.0)
Platelets: 500 10*3/uL — ABNORMAL HIGH (ref 150–400)
RBC: 3.63 MIL/uL — ABNORMAL LOW (ref 4.22–5.81)
RDW: 13.8 % (ref 11.5–15.5)
WBC: 10.7 10*3/uL — ABNORMAL HIGH (ref 4.0–10.5)
nRBC: 0 % (ref 0.0–0.2)

## 2019-07-25 LAB — BASIC METABOLIC PANEL
Anion gap: 6 (ref 5–15)
BUN: 17 mg/dL (ref 8–23)
CO2: 19 mmol/L — ABNORMAL LOW (ref 22–32)
Calcium: 9 mg/dL (ref 8.9–10.3)
Chloride: 119 mmol/L — ABNORMAL HIGH (ref 98–111)
Creatinine, Ser: 1.29 mg/dL — ABNORMAL HIGH (ref 0.61–1.24)
GFR calc Af Amer: >60 mL/min (ref >60)
GFR calc non Af Amer: 53 mL/min — ABNORMAL LOW (ref >60)
Glucose, Bld: 97 mg/dL (ref 70–99)
Potassium: 4 mmol/L (ref 3.5–5.1)
Sodium: 144 mmol/L (ref 135–145)

## 2019-07-25 LAB — GLUCOSE, CAPILLARY
Glucose-Capillary: 102 mg/dL — ABNORMAL HIGH (ref 70–99)
Glucose-Capillary: 113 mg/dL — ABNORMAL HIGH (ref 70–99)
Glucose-Capillary: 115 mg/dL — ABNORMAL HIGH (ref 70–99)
Glucose-Capillary: 116 mg/dL — ABNORMAL HIGH (ref 70–99)
Glucose-Capillary: 118 mg/dL — ABNORMAL HIGH (ref 70–99)
Glucose-Capillary: 133 mg/dL — ABNORMAL HIGH (ref 70–99)

## 2019-07-25 MED ORDER — DEXTROSE-NACL 5-0.9 % IV SOLN: INTRAVENOUS | Status: DC

## 2019-07-25 MED ORDER — HEPARIN SODIUM (PORCINE) 5000 UNIT/ML IJ SOLN
5000.0000 [IU] | Freq: Three times a day (TID) | INTRAMUSCULAR | Status: DC
  Administered 2019-07-25 – 2019-08-05 (×33): 5000 [IU] via SUBCUTANEOUS
  Filled 2019-07-25 (×33): qty 1

## 2019-07-25 MED ORDER — PROMETHAZINE HCL 25 MG/ML IJ SOLN
12.5000 mg | Freq: Once | INTRAMUSCULAR | Status: AC
  Administered 2019-07-25: 12.5 mg via INTRAVENOUS
  Filled 2019-07-25: qty 1

## 2019-07-25 MED ORDER — LOPERAMIDE HCL 1 MG/7.5ML PO SUSP
2.0000 mg | Freq: Four times a day (QID) | ORAL | Status: DC
  Administered 2019-07-25 – 2019-08-22 (×93): 2 mg
  Filled 2019-07-25 (×119): qty 15

## 2019-07-25 NOTE — Plan of Care (Signed)
  Problem: Health Behavior/Discharge Planning: Goal: Ability to manage health-related needs will improve Outcome: Progressing   Problem: Safety: Goal: Ability to remain free from injury will improve Outcome: Progressing   

## 2019-07-25 NOTE — Consult Note (Addendum)
[facility] Surgery Consult Note  Christyan Reger 02/22/1941  [id number].    Requesting MD: Erlinda Hong, MD Chief Complaint/Reason for Consult: PEG tube placement, dysphagia, poor oral intake  HPI:  [name] is a 79 y/o M with a PMH afib on Eliquis, systolic CHF (EF 54-27%), HLD, CAD, HTN, prostate cancer who was admitted 07/09/19 after being diagnoses with a R MCA CVA. He underwent IR revascularization with R ICA stent placement. Hospitalization complicated by R sylvian fissure SAH, as well as pneumonia.  Patient is currently on daily aspirin 81 mg and Brilinta 90 mg twice daily.  Patient has worked with therapies who are recommending skilled nursing facility at discharge.  Patient has worked with speech therapy who passed patient for honey thick liquid, dysphagia 1 (pure) diet, however his cognition and severe dysphagia prevent meaningful oral intake.  He was started on tube feeds for supplemental nutrition via Cortrak. We were asked to see for PEG placement.  Patient does have hx of inguinal hernia repair. Per notes, Neurology has had discussion in length about PEG placement and she is agreeable.   ROS: Review of Systems  Constitutional: Negative for chills and fever.  Respiratory: Negative for shortness of breath.   Cardiovascular: Negative for chest pain.  Gastrointestinal: Positive for diarrhea. Negative for abdominal pain, nausea and vomiting.  Genitourinary: Negative for dysuria.  Psychiatric/Behavioral: Negative for substance abuse.  All other systems reviewed and are negative.   Family History  Problem Relation Age of Onset  . Heart disease Mother        Further details not reported, died at age 57  . Valvular heart disease Father        H/o MV surgery, diet at age 41    Past Medical History:  Diagnosis Date  . Acid reflux   . CHF (congestive heart failure) ([place])   . Coronary artery disease   . Dilated cardiomyopathy ([place])   . Folliculitis   . Gastric ulcer   . Hiatal  hernia   . Hyperlipidemia    a. pt is adamantly against statins.  . Hypertension   . Ischemia    a. Pt states he was diagnosed with "ischemia" in the 1990s but does not know further details, denies hx of heart blockage.  . Nummular dermatitis   . Prostate cancer ([place])   . Renal disorder   . Scoliosis   . Sinus bradycardia   . Stomach ulcer    a. remote ulcer in the 1990s, no hx of bleeding.    Past Surgical History:  Procedure Laterality Date  . INGUINAL HERNIA REPAIR Left   . IR ANGIO INTRA EXTRACRAN SEL COM CAROTID INNOMINATE UNI L MOD SED  07/10/2019  . IR ANGIO VERTEBRAL SEL SUBCLAVIAN INNOMINATE UNI R MOD SED  07/10/2019  . IR CT HEAD LTD  07/10/2019  . IR INTRAVSC STENT CERV CAROTID W/O EMB-PROT MOD SED INC ANGIO  07/10/2019  . IR PERCUTANEOUS ART THROMBECTOMY/INFUSION INTRACRANIAL INC DIAG ANGIO  07/10/2019  . RADIOLOGY WITH ANESTHESIA N/A 07/09/2019   Procedure: IR WITH ANESTHESIA;  Surgeon: Luanne Bras, MD;  Location: [place];  Service: Radiology;  Laterality: N/A;    Social History:  reports that he has never smoked. He has never used smokeless tobacco. He reports that he does not drink alcohol or use drugs. Married, lived at home prior to admission with his wife Delia   Allergies: No Known Allergies  Medications Prior to Admission  Medication Sig Dispense Refill  . apixaban (  ELIQUIS) 5 MG TABS tablet Take 1 tablet (5 mg total) by mouth 2 (two) times daily. 180 tablet 1  . Calcium Carb-Cholecalciferol (CALCIUM 600-D PO) Take 1 tablet by mouth daily.    . Cholecalciferol (VITAMIN D) 50 MCG (2000 UT) tablet Take 2,000 Units by mouth daily.    . Coenzyme Q10 (COQ10) 100 MG CAPS Take 100 mg by mouth daily.    . hydrALAZINE (APRESOLINE) 50 MG tablet TAKE 1 TABLET THREE TIMES A DAY (NEED FEBRUARY APPOINTMENT FOR FURTHER REFILLS THANKS ) (Patient taking differently: Take 50 mg by mouth 3 (three) times daily. ) 180 tablet 2  . hydrocortisone cream 1 % Apply 1 application topically 2  (two) times daily as needed for itching.     . lidocaine (XYLOCAINE) 5 % ointment Apply 1 application topically 2 (two) times daily as needed (pain).     . metoprolol tartrate (LOPRESSOR) 25 MG tablet TAKE 1 TABLET TWICE A DAY (Patient taking differently: Take 25 mg by mouth 2 (two) times daily. ) 180 tablet 2  . nitroGLYCERIN (NITROSTAT) 0.4 MG SL tablet Place 0.4 mg under the tongue every 5 (five) minutes as needed for chest pain.     Marland Kitchen omeprazole (PRILOSEC) 20 MG capsule Take 1 capsule (20 mg total) by mouth daily. (Patient taking differently: Take 20 mg by mouth daily as needed (acid reflux/indigestion). ) 90 capsule 3  . tamsulosin (FLOMAX) 0.4 MG CAPS capsule Take 0.4 mg by mouth daily after supper.       Blood pressure 123/71, pulse 74, temperature 98.6 F (37 C), temperature source Oral, resp. rate 18, height 6\' 2"  (1.88 m), weight 83.3 kg, SpO2 92 %. Physical Exam: General: pleasant, frail elderly white male who is laying in bed in NAD HEENT: head is normocephalic, atraumatic.  Sclera are noninjected.  PERRL.  Ears and nose without any masses or lesions.  Mouth is pink and moist. Dentition fair. Cortrak in place.  Heart: Irregular rhythm with regular rate. No obvious murmur. Palpable radial and pedal pulses bilaterally  Lungs: CTAB, no wheezes, rhonchi, or rales noted.  Respiratory effort nonlabored Abd: Soft, NT/ND, +BS, no masses, hernias, or organomegaly. Prior inguinal hernia repair scars noted b/l that are well healed. No other abdominal scars.  MS: no BUE/BLE edema, calves soft and nontender Skin: warm and dry with no masses, lesions, or rashes Psych: A&O to time (2021), self (name but not DOB), and situation. Not orientated to place. Appropriate affect Neuro: No aphasia. Mild dysarthria. Follows commands. Cranial nerves grossly intact, equal strength in BUE/BLE bilaterally  Results for orders placed or performed during the hospital encounter of 07/09/19 (from the past 48 hour(s))   Glucose, capillary     Status: Abnormal   Collection Time: 07/23/19 11:55 AM  Result Value Ref Range   Glucose-Capillary 138 (H) 70 - 99 mg/dL    Comment: Glucose reference range applies only to samples taken after fasting for at least 8 hours.  Glucose, capillary     Status: Abnormal   Collection Time: 07/23/19  4:54 PM  Result Value Ref Range   Glucose-Capillary 106 (H) 70 - 99 mg/dL    Comment: Glucose reference range applies only to samples taken after fasting for at least 8 hours.   Comment 1 Notify RN    Comment 2 Document in Chart   Glucose, capillary     Status: Abnormal   Collection Time: 07/23/19  7:32 PM  Result Value Ref Range   Glucose-Capillary 111 (H)  70 - 99 mg/dL    Comment: Glucose reference range applies only to samples taken after fasting for at least 8 hours.   Comment 1 Notify RN    Comment 2 Document in Chart   Glucose, capillary     Status: Abnormal   Collection Time: 07/23/19 11:04 PM  Result Value Ref Range   Glucose-Capillary 109 (H) 70 - 99 mg/dL    Comment: Glucose reference range applies only to samples taken after fasting for at least 8 hours.   Comment 1 Notify RN    Comment 2 Document in Chart   Blood gas, arterial     Status: Abnormal   Collection Time: 07/24/19  3:05 AM  Result Value Ref Range   FIO2 28.00    pH, Arterial 7.467 (H) 7.350 - 7.450   pCO2 arterial 33.7 32.0 - 48.0 mmHg   pO2, Arterial 82.8 (L) 83.0 - 108.0 mmHg   Bicarbonate 24.0 20.0 - 28.0 mmol/L   Acid-Base Excess 0.7 0.0 - 2.0 mmol/L   O2 Saturation 96.8 %   Patient temperature 36.9    Collection site RIGHT RADIAL    Drawn by Estill Bamberg    Sample type ARTERIAL    Allens test (pass/fail) PASS PASS    Comment: Performed at [facility], [place] [address] [place], [place] [address]  Glucose, capillary     Status: Abnormal   Collection Time: 07/24/19  3:16 AM  Result Value Ref Range   Glucose-Capillary 112 (H) 70 - 99 mg/dL    Comment: Glucose reference range  applies only to samples taken after fasting for at least 8 hours.   Comment 1 Notify RN    Comment 2 Document in Chart   CBC     Status: Abnormal   Collection Time: 07/24/19  3:33 AM  Result Value Ref Range   WBC 13.9 (H) 4.0 - 10.5 K/uL   RBC 3.57 (L) 4.22 - 5.81 MIL/uL   Hemoglobin 11.0 (L) 13.0 - 17.0 g/dL   HCT 34.6 (L) 39.0 - 52.0 %   MCV 96.9 80.0 - 100.0 fL   MCH 30.8 26.0 - 34.0 pg   MCHC 31.8 30.0 - 36.0 g/dL   RDW 13.8 11.5 - 15.5 %   Platelets 460 (H) 150 - 400 K/uL   nRBC 0.0 0.0 - 0.2 %    Comment: Performed at [facility], [address] [place], [place] [address]  Basic metabolic panel     Status: Abnormal   Collection Time: 07/24/19  3:33 AM  Result Value Ref Range   Sodium 141 135 - 145 mmol/L   Potassium 3.4 (L) 3.5 - 5.1 mmol/L   Chloride 107 98 - 111 mmol/L   CO2 25 22 - 32 mmol/L   Glucose, Bld 118 (H) 70 - 99 mg/dL    Comment: Glucose reference range applies only to samples taken after fasting for at least 8 hours.   BUN 25 (H) 8 - 23 mg/dL   Creatinine, Ser 1.34 (H) 0.61 - 1.24 mg/dL   Calcium 8.5 (L) 8.9 - 10.3 mg/dL   GFR calc non Af Amer 50 (L) >60 mL/min   GFR calc Af Amer 58 (L) >60 mL/min   Anion gap 9 5 - 15    Comment: Performed at [facility] [address] [place], [place] [address]  Glucose, capillary     Status: Abnormal   Collection Time: 07/24/19  8:03 AM  Result Value Ref  Range   Glucose-Capillary 124 (H) 70 - 99 mg/dL    Comment: Glucose reference range applies only to samples taken after fasting for at least 8 hours.  Glucose, capillary     Status: Abnormal   Collection Time: 07/24/19 11:36 AM  Result Value Ref Range   Glucose-Capillary 123 (H) 70 - 99 mg/dL    Comment: Glucose reference range applies only to samples taken after fasting for at least 8 hours.  Glucose, capillary     Status: Abnormal   Collection Time: 07/24/19  4:23 PM  Result Value Ref Range   Glucose-Capillary 137 (H) 70 - 99 mg/dL    Comment:  Glucose reference range applies only to samples taken after fasting for at least 8 hours.  Glucose, capillary     Status: Abnormal   Collection Time: 07/24/19  7:36 PM  Result Value Ref Range   Glucose-Capillary 117 (H) 70 - 99 mg/dL    Comment: Glucose reference range applies only to samples taken after fasting for at least 8 hours.   Comment 1 Notify RN    Comment 2 Document in Chart   Glucose, capillary     Status: Abnormal   Collection Time: 07/24/19 11:15 PM  Result Value Ref Range   Glucose-Capillary 132 (H) 70 - 99 mg/dL    Comment: Glucose reference range applies only to samples taken after fasting for at least 8 hours.   Comment 1 Notify RN    Comment 2 Document in Chart   Basic metabolic panel     Status: Abnormal   Collection Time: 07/25/19  2:38 AM  Result Value Ref Range   Sodium 144 135 - 145 mmol/L   Potassium 4.0 3.5 - 5.1 mmol/L   Chloride 119 (H) 98 - 111 mmol/L   CO2 19 (L) 22 - 32 mmol/L   Glucose, Bld 97 70 - 99 mg/dL    Comment: Glucose reference range applies only to samples taken after fasting for at least 8 hours.   BUN 17 8 - 23 mg/dL   Creatinine, Ser 1.29 (H) 0.61 - 1.24 mg/dL   Calcium 9.0 8.9 - 10.3 mg/dL   GFR calc non Af Amer 53 (L) >60 mL/min   GFR calc Af Amer >60 >60 mL/min   Anion gap 6 5 - 15    Comment: Performed at [facility] [address] [place], [place] [address]  Glucose, capillary     Status: Abnormal   Collection Time: 07/25/19  3:10 AM  Result Value Ref Range   Glucose-Capillary 116 (H) 70 - 99 mg/dL    Comment: Glucose reference range applies only to samples taken after fasting for at least 8 hours.   Comment 1 Notify RN    Comment 2 Document in Chart   CBC     Status: Abnormal   Collection Time: 07/25/19  4:07 AM  Result Value Ref Range   WBC 10.7 (H) 4.0 - 10.5 K/uL   RBC 3.63 (L) 4.22 - 5.81 MIL/uL   Hemoglobin 11.3 (L) 13.0 - 17.0 g/dL   HCT 34.8 (L) 39.0 - 52.0 %   MCV 95.9 80.0 - 100.0 fL   MCH 31.1 26.0 -  34.0 pg   MCHC 32.5 30.0 - 36.0 g/dL   RDW 13.8 11.5 - 15.5 %   Platelets 500 (H) 150 - 400 K/uL   nRBC 0.0 0.0 - 0.2 %    Comment: Performed at [facility] [address],  [place], [place] [address]  Glucose, capillary     Status: Abnormal   Collection Time: 07/25/19  7:31 AM  Result Value Ref Range   Glucose-Capillary 113 (H) 70 - 99 mg/dL    Comment: Glucose reference range applies only to samples taken after fasting for at least 8 hours.   DG Abd 1 View  Result Date: 07/25/2019 CLINICAL DATA:  Feeding tube placement EXAM: ABDOMEN - 1 VIEW COMPARISON:  07/14/2019 FINDINGS: Enteric tube terminates at the level of the gastric antrum/proximal duodenum. Mildly prominent loops of small bowel in the central abdomen. IMPRESSION: Enteric tube terminates at the level of the gastric antrum/proximal duodenum. Electronically Signed   By: Julian Hy M.D.   On: 07/25/2019 04:15   DG Chest Port 1 View  Result Date: 07/25/2019 CLINICAL DATA:  Vomiting EXAM: PORTABLE CHEST 1 VIEW COMPARISON:  07/24/2019 FINDINGS: Mild bibasilar atelectasis with possible trace left pleural effusion. No frank interstitial edema. No pneumothorax. The heart is top-normal in size.  Thoracic aortic atherosclerosis. Stable mild eventration of the right hemidiaphragm. Enteric tube courses into the stomach. IMPRESSION: Mild bibasilar atelectasis with possible trace left pleural effusion. Electronically Signed   By: Julian Hy M.D.   On: 07/25/2019 04:15   DG CHEST PORT 1 VIEW  Result Date: 07/24/2019 CLINICAL DATA:  Shortness of breath EXAM: PORTABLE CHEST 1 VIEW COMPARISON:  07/19/2019, 12/18/2016, 07/14/2019 FINDINGS: Esophageal tube tip below the diaphragm but incompletely visualized. The right lung is grossly clear. Slightly improved aeration at left base with residual atelectasis medially. Enlarged cardiomediastinal silhouette with aortic atherosclerosis. No pneumothorax. IMPRESSION: Slightly improved  aeration at left lung base with residual atelectasis or infiltrate medially. Cardiomegaly. Electronically Signed   By: Donavan Foil M.D.   On: 07/24/2019 03:36   Anti-infectives (From admission, onward)   Start     Dose/Rate Route Frequency Ordered Stop   07/13/19 0700  ampicillin-sulbactam (UNASYN) 1.5 g in sodium chloride 0.9 % 100 mL IVPB  Status:  Discontinued     1.5 g 200 mL/hr over 30 Minutes Intravenous Every 6 hours 07/13/19 0631 07/20/19 0459   07/09/19 2134  ceFAZolin (ANCEF) 2-4 GM/100ML-% IVPB    Note to Pharmacy: Tamsen Snider   : cabinet override      07/09/19 2134 07/10/19 0105      Assessment/Plan Afib on Eliquis - currently on hold  Systolic CHF (EF 65-03%) HLD CAD HTN Hx Prostate cancer  R MCA CVA s/p IR revascularization with R ICA stent placement. R sylvian fissure SAH  Dysphagia - Will discuss with MD in regards to timing of PEG placement.  - I discussed with Schuyler Copeland, the patients wife, including the indications and risks of the procedure. She gives consent to proceed with the PEG procedure.  - Will plan for PEG tomorrow   FEN: Okay for diet (per speech recs) & TF's today. NPO & hold tube feeds at midnight.  ID - None VTE - subq Heparin, Aspirin 81 mg and Brilinta 90 mg twice daily.  Jillyn Ledger, [facility] Surgery 07/27/2019, 10:09 AM

## 2019-07-25 NOTE — Progress Notes (Addendum)
Patient is constantly gaging and coughing until he spits up saliva or yellow bile substance despite antiemetic.  Patient stating, "I cant' breathe!" but oxygen is stating 94-96% on room air.  RN place 2 liters of nasal canula for comfort but patient has removed it several times.  Patient stated, "he feels like the tube feedings are making him sick."  Spoke with Dr. Cheral Marker via telephone and the order was given to stop tube feeding for tonight and additional antiemetic was ordered.  2:20am Pt is still having copious amounts of yellow substance running out of corner of his mouth.  Pt is now having low grade temperatures; Dr. Cheral Marker notified and stat KUB, chest x-Abdallah Hern, and labs ordered; no new orders at this time.

## 2019-07-25 NOTE — Progress Notes (Signed)
KUB shows correct placement of Cortrak. CXR shows bibasilar atelectasis with no evidence for aspiration PNA. Patient now sleeping comfortably after tube feeds turned off and administration of 12.5 mg Phenergan. Still with some yellowish drainage from the corner of his mouth.   May need to expedite PEG placement due to the nausea and retching that the Cortrak seems to be inducing in this patient.   Electronically signed: Dr. Kerney Elbe

## 2019-07-25 NOTE — Progress Notes (Signed)
Surgery was consulted for PEG placement.  Will discuss with trauma MD's who perform these procedures and follow up on Monday.   Obie Dredge, PA-C [facility] Please see amion for on call provider

## 2019-07-25 NOTE — Progress Notes (Signed)
STROKE TEAM PROGRESS NOTE   INTERVAL HISTORY RN at the bedside. Pt reclined in the bed, lethargic and sleepy but arousable and answer questions appropriately. Overnight, pt had episode of nausea, yellow bile-containing emesis. Continue to have diarrhea. Pt stated that the cortrak is irritating. RN stated that pt buttock stage 1 decubitus ulcer. Tube feeding has been hold overnight. Will d/c tube feeding for now and continue meds only from cortrak. Will put on IVF. Will consult trauma surgery to do PEG on Monday.    Vitals:   07/25/19 0312 07/25/19 0313 07/25/19 0500 07/25/19 0734  BP: 129/74 129/72  123/71  Pulse: 95 95  74  Resp: (!) 21 19  18   Temp: 99.5 F (37.5 C) 99.5 F (37.5 C)  98.6 F (37 C)  TempSrc: Oral Oral  Oral  SpO2: 98% 98%  92%  Weight:   83.3 kg   Height:        CBC:  Recent Labs  Lab 07/24/19 0333 07/25/19 0407  WBC 13.9* 10.7*  HGB 11.0* 11.3*  HCT 34.6* 34.8*  MCV 96.9 95.9  PLT 460* 500*    Basic Metabolic Panel:  Recent Labs  Lab 07/24/19 0333 07/25/19 0238  NA 141 144  K 3.4* 4.0  CL 107 119*  CO2 25 19*  GLUCOSE 118* 97  BUN 25* 17  CREATININE 1.34* 1.29*  CALCIUM 8.5* 9.0    IMAGING past 24 hours DG Abd 1 View  Result Date: 07/25/2019 CLINICAL DATA:  Feeding tube placement EXAM: ABDOMEN - 1 VIEW COMPARISON:  07/14/2019 FINDINGS: Enteric tube terminates at the level of the gastric antrum/proximal duodenum. Mildly prominent loops of small bowel in the central abdomen. IMPRESSION: Enteric tube terminates at the level of the gastric antrum/proximal duodenum. Electronically Signed   By: Julian Hy M.D.   On: 07/25/2019 04:15   DG Chest Port 1 View  Result Date: 07/25/2019 CLINICAL DATA:  Vomiting EXAM: PORTABLE CHEST 1 VIEW COMPARISON:  07/24/2019 FINDINGS: Mild bibasilar atelectasis with possible trace left pleural effusion. No frank interstitial edema. No pneumothorax. The heart is top-normal in size.  Thoracic aortic  atherosclerosis. Stable mild eventration of the right hemidiaphragm. Enteric tube courses into the stomach. IMPRESSION: Mild bibasilar atelectasis with possible trace left pleural effusion. Electronically Signed   By: Julian Hy M.D.   On: 07/25/2019 04:15     PHYSICAL EXAM  General - frail elderly malnourished looking Caucasian, lethargic and drowsy  Ophthalmologic - fundi not visualized due to noncooperation.  Cardiovascular - Regular rhythm and rate.   Neurological Exam - Patient is lethargic, sleepy drowsy and eyes closed, but easily open on voice, however difficulty to maintain wakefulness without repetitive stimulation.  Follows simple commands on wakening.  Speech moderate dysarthric. Visual field full, no hemianopia. Gaze bilaterally without difficulty, PERRL. Tracking bilaterally. Left mild facial droop. Tongue midline. BUE 3/5. BLE 2/5 proximal and distal. DTR 1+ and no babinski. Sensation and coordination not cooperative. Gait not tested.    ASSESSMENT/PLAN Mr. Jaydee Conran is a 79 y.o. male with history of Afib, systolic CHF, HLD, CAD, HTN, prostate cancer presenting with left sided weakness, facial droop, dysarthria.   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 embolic secondary to known AF even on Eliquis  Code Stroke CT head evolving R MCA territory infarct (insula, cerebral cortex).  hyperdense R M2 +/- M3. ASPECTS 7    CTA head & neck +LVO. Probable ruptured plaque R ICA bifurcation w/ near occlusive stenosis, possible superimposed dissection. Distal reconstitution R M1 from collaterals but w/ distal embolic occlusion proximal R M2/3 superior division branch. L ICA bifurcation and siphon atherosclerosis. B VA origins 50% stenoses. R subclavian artery origin 60% stenosis.   CT perfusion 19cc core infarct R insula and posterior  frontal. 33cc penumbra.   Cerebral angio TICI3 revascularization occluded middle trifurcation R MCA branch, revascularization R ICA occlusion w/ angioplasty and stenting.   Post IR CT contrast stain R putamen and moderate R sylvian fissure, SAH + contrast.    CT head R sylvian fissure SAH. Increased contrast vs hemorrhage R suprasellar cistern following ICA concerned for increased SAH. R sulci effacement.   Carotid Doppler  R ICA stent patent  MRI  Patchy R MCA infarcts. SAH R sylvian fissure. Small IVH.   MRA - Severely motion degraded head MRA. Persistent patency of the revascularized right ICA. No flow limiting proximal stenosis identified.   CT head repeat 5/11 R insular infarct stable. Residual small SAH and small IVH.  CT head repeat 5/13 evolving R MCA infarct, SAH unchanged   2D Echo EF 35-40%. No source of embolus   LDL 90  HgbA1c 5.8  SCDs for VTE prophylaxis  Eliquis (apixaban) daily prior to admission, now on aspirin 81 mg daily and Brilinta (ticagrelor) 90 mg bid. Continue to hold [facility] given CT with continued SAH and IVH and potential PEG placement. Consider to repeat CT next week and decide on [facility]  Therapy recommendations:  CIR - > pt w/o family support at home. Will need SNF  Disposition:  SW following w/ family for bed choice  Acute hypoxemic respiratory failure, resolved Likely Aspiration PNA, treated  Extubated w/ CCM signing off 5/8  Vomiting x 5 with possible aspiration requiring NRB 5/10. CCM reconsulted. Transferred to ICU and Intubated  Extubated 5/11 w/o difficulties  Unasyn 5/10>>5/17 for 7 days  Leukocytosis WBC 13.9 ->10.7  CXR - slight improvement aeration LLL w/ atx/infiltrate. Cardiomegaly.   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 Hold AC given post IR SAH and need for DAPT post stent placement. . Bradycardia resolved off sedation . Consider AC once SAH resolves and PEG placed, will consider to repeat CT next week.   Hypertension  Home meds:  Hydralazine 50 tid, metoprolol 25 bid  On Hydralazine 25 mg Q8 and metoprolol 12.5 bid . SBP 120s, on the low end  . Long-term BP goal normotensive  Hyperlipidemia  Home meds:  No statin  LDL 90, goal < 70  May consider statin again ->pt adamantly has refused statins in the past  AKI on CKD, improving  Cre 1.55->1.50->1.40->1.37->1.34->1.29  Renal US R w/ increased echogenicity. 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 Secondary to stroke . SLP difficulty d/t pt participation. . Trial D1 puree w/ honey thick liquids -> pt not swallowing and eat enough for daily needs -> Back to continuous feeding . On free water 150 mL q4h . Encourage po intake . Pt then irritated by cortrak with persistent diarrhea -> hold off TF and on IVF . Discussed with wife in length, and she now is agreeable for PEG -> surgery consulted . Speech and dietician on board  Other Stroke Risk Factors  Advanced age  Coronary artery disease  Chronic systolic Congestive heart failure  Other Active Problems  GERD on PPI   BPH on flomax PTA  Prostate cancer   Hypokalemia - resolved 3.4->4.0  LBBB. Appears old. EKG neg ischemia. Trops 27->32  (Likely strain related)  Small blood clots in loose stool with recurrence. Hgb stable (10.9->11.0->10.5->11.0->11.3). Continue to monitor.  No  indication for colonoscopy  Loose stool on imodium scheduled  Thrombocytosis - 460->500  Hospital Day #16   Patient condition worsened within the last 24 hours, has developed nausea, bile-containing vomiting, lethargy, persistent diarrhea, continues to have difficulty with swallowing and TF, and I have held off the TF and supplement, now on D5NS. I have called trauma surgery for PEG placement on Monday. I spent  35 minutes in total face-to-face time with the patient, more than 50% of which was spent in counseling and coordination of care, reviewing test results, images and medication, and discussing the diagnosis, treatment plan and potential prognosis. This patient's care requiresreview of multiple databases, neurological assessment, discussion with family, other specialists and medical decision making of high complexity.  [name], MD PhD Stroke Neurology 07/25/2019 4:24 PM    To contact Stroke Continuity provider, please refer to [email or web address] After hours, contact General Neurology

## 2019-07-26 LAB — CBC
HCT: 30 % — ABNORMAL LOW (ref 39.0–52.0)
Hemoglobin: 9.5 g/dL — ABNORMAL LOW (ref 13.0–17.0)
MCH: 30.5 pg (ref 26.0–34.0)
MCHC: 31.7 g/dL (ref 30.0–36.0)
MCV: 96.5 fL (ref 80.0–100.0)
Platelets: 437 10*3/uL — ABNORMAL HIGH (ref 150–400)
RBC: 3.11 MIL/uL — ABNORMAL LOW (ref 4.22–5.81)
RDW: 13.9 % (ref 11.5–15.5)
WBC: 8.1 10*3/uL (ref 4.0–10.5)
nRBC: 0 % (ref 0.0–0.2)

## 2019-07-26 LAB — BASIC METABOLIC PANEL
Anion gap: 6 (ref 5–15)
BUN: 22 mg/dL (ref 8–23)
CO2: 24 mmol/L (ref 22–32)
Calcium: 7.9 mg/dL — ABNORMAL LOW (ref 8.9–10.3)
Chloride: 109 mmol/L (ref 98–111)
Creatinine, Ser: 1.46 mg/dL — ABNORMAL HIGH (ref 0.61–1.24)
GFR calc Af Amer: 53 mL/min — ABNORMAL LOW (ref >60)
GFR calc non Af Amer: 45 mL/min — ABNORMAL LOW (ref >60)
Glucose, Bld: 114 mg/dL — ABNORMAL HIGH (ref 70–99)
Potassium: 3.4 mmol/L — ABNORMAL LOW (ref 3.5–5.1)
Sodium: 139 mmol/L (ref 135–145)

## 2019-07-26 LAB — GLUCOSE, CAPILLARY
Glucose-Capillary: 106 mg/dL — ABNORMAL HIGH (ref 70–99)
Glucose-Capillary: 108 mg/dL — ABNORMAL HIGH (ref 70–99)
Glucose-Capillary: 125 mg/dL — ABNORMAL HIGH (ref 70–99)
Glucose-Capillary: 108 mg/dL — ABNORMAL HIGH (ref 70–99)
Glucose-Capillary: 110 mg/dL — ABNORMAL HIGH (ref 70–99)

## 2019-07-26 MED ORDER — POTASSIUM CHLORIDE 20 MEQ/15ML (10%) PO SOLN: 40.0000 meq | ORAL | Status: DC

## 2019-07-26 MED ORDER — POLYVINYL ALCOHOL 1.4 % OP SOLN
1.0000 [drp] | Freq: Four times a day (QID) | OPHTHALMIC | Status: DC
  Administered 2019-07-26 – 2019-08-24 (×90): 1 [drp] via OPHTHALMIC
  Filled 2019-07-26: qty 15

## 2019-07-26 MED ORDER — FREE WATER
150.0000 mL | Freq: Four times a day (QID) | Status: DC
  Administered 2019-07-26 – 2019-08-02 (×26): 150 mL

## 2019-07-26 MED ORDER — POTASSIUM CHLORIDE 20 MEQ/15ML (10%) PO SOLN
40.0000 meq | ORAL | Status: AC
  Administered 2019-07-26 (×2): 40 meq
  Filled 2019-07-26 (×2): qty 30

## 2019-07-26 NOTE — Progress Notes (Addendum)
STROKE TEAM PROGRESS NOTE   INTERVAL HISTORY: RN at bedside. Pt awake alert. Tube feeding stopped yesterday and he felt good, no more diarrhea. Still has intermittent cortrak irritation but better. He is in good spirit, fully orientated, I discussed with him about PEG and he is in agreement. However, I am not sure if his diarrhea would be better if he started TF again after PEG.   Vitals:   07/26/19 0452 07/26/19 0454 07/26/19 0823 07/26/19 1211  BP:  (!) 158/67 (!) 153/92 (!) 148/77  Pulse:  83 97 65  Resp:  18 15 15   Temp:  98.1 F (36.7 C) 98.3 F (36.8 C) 97.6 F (36.4 C)  TempSrc:  Oral Oral Oral  SpO2:  93% 94% 95%  Weight: 84.8 kg     Height:        CBC:  Recent Labs  Lab 07/25/19 0407 07/26/19 0356  WBC 10.7* 8.1  HGB 11.3* 9.5*  HCT 34.8* 30.0*  MCV 95.9 96.5  PLT 500* 437*    Basic Metabolic Panel:  Recent Labs  Lab 07/25/19 0238 07/26/19 0356  NA 144 139  K 4.0 3.4*  CL 119* 109  CO2 19* 24  GLUCOSE 97 114*  BUN 17 22  CREATININE 1.29* 1.46*  CALCIUM 9.0 7.9*    IMAGING past 24 hours No results found.   PHYSICAL EXAM  General - frail elderly malnourished looking Caucasian, not in acute distress  Ophthalmologic - fundi not visualized due to noncooperation.  Cardiovascular - Regular rhythm and rate.   Neurological Exam - Patient is awake alert, fully orientated.  Follows simple commands and no aphasia. Able to name and repeat. Speech mildly dysarthric. Visual field full, no hemianopia. Gaze bilaterally without difficulty, PERRL. Tracking bilaterally. Left mild facial droop. Tongue midline. BUE 4/5. BLE 3/5 proximal and distal. DTR 1+ and no babinski. Sensation symmetrical and coordination intact b/l FTN. Gait not tested.    ASSESSMENT/PLAN [name] is a 79 y.o. male with history of Afib, systolic CHF, HLD, CAD, HTN, prostate cancer presenting with left sided weakness, facial droop, dysarthria.   Stroke:   Patchy R MCA infarcts due to  right ICA and M2 occlusion, s/p IR w/ M2/3 TICI3 revascularization and R ICA stent with small SAH & IVH - infarct likely right ICA atherosclerosis vs. embolic secondary to known AF even on Eliquis  Code Stroke CT head evolving R MCA territory infarct (insula, cerebral cortex). hyperdense R M2 +/- M3. ASPECTS 7    CTA head & neck +LVO. Probable ruptured plaque R ICA bifurcation w/ near occlusive stenosis, possible superimposed dissection. Distal reconstitution R M1 from collaterals but w/ distal embolic occlusion proximal R M2/3 superior division branch. L ICA bifurcation and siphon atherosclerosis. B VA origins 50% stenoses. R subclavian artery origin 60% stenosis.   CT perfusion 19cc core infarct R insula and posterior frontal. 33cc penumbra.   Cerebral angio TICI3 revascularization occluded middle trifurcation R MCA branch, revascularization R ICA occlusion w/ angioplasty and stenting.   Post IR CT contrast stain R putamen and moderate R sylvian fissure, SAH + contrast.    CT head R sylvian fissure SAH. Increased contrast vs hemorrhage R suprasellar cistern following ICA concerned for increased SAH. R sulci effacement.   Carotid Doppler  R ICA stent patent  MRI  Patchy R MCA infarcts. SAH R sylvian fissure. Small IVH.   MRA - Severely motion degraded head MRA. Persistent patency of the revascularized right ICA. No flow limiting  proximal stenosis identified.   CT head repeat 5/11 R insular infarct stable. Residual small SAH and small IVH.  CT head repeat 5/13 evolving R MCA infarct, SAH unchanged   2D Echo EF 35-40%. No source of embolus   LDL 90  HgbA1c 5.8  SCDs for VTE prophylaxis  Eliquis (apixaban) daily prior to admission, now on aspirin 81 mg daily and Brilinta (ticagrelor) 90 mg bid. AC so far on hold given continued SAH and IVH on CT, and PEG placement. Consider to repeat CT next week after PEG and decide on [facility]  Therapy recommendations:  CIR - > pt w/o family support at  home. Will need SNF  Disposition:  pending  Acute hypoxemic respiratory failure, resolved Likely Aspiration PNA, treated  Extubated w/ CCM signing off 5/8  Vomiting x 5 with possible aspiration requiring NRB 5/10. CCM reconsulted. Transferred to ICU and Intubated  Extubated 5/11 w/o difficulties  Unasyn 5/10>>5/17 for 7 days  Leukocytosis WBC 13.9 ->10.7->8.1  CXR - slight improvement aeration LLL w/ atx/infiltrate. Cardiomegaly.   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 Hold AC given post IR SAH and need for DAPT post stent placement. . Bradycardia resolved off sedation . Consider AC once SAH resolves and PEG placed, will consider to repeat CT next week after PEG.   Hypertension  Home meds:  Hydralazine 50 tid, metoprolol 25 bid  On Hydralazine 25 mg Q8 and metoprolol 12.5 bid . SBP 120s, on the low end  . Long-term BP goal normotensive  Hyperlipidemia  Home meds:  No statin  LDL 90, goal < 70  May consider statin again ->pt adamantly has refused statins in the past  AKI on CKD   Cre 1.55->1.50->1.40->1.37->1.34->1.29->1.46  Renal US R w/ increased echogenicity. 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 Secondary to stroke . SLP difficulty d/t pt participation. . Trial D1 puree w/ honey thick liquids -> pt not  swallowing and eat enough for daily needs -> Back to continuous feeding -> off TF . On free water 150 mL q6h . Encourage po intake . Pt then irritated by cortrak and persistent diarrhea -> hold off TF and on IVF . Discussed with wife in length, and she now is agreeable for PEG, pt is agreeable with PEG -> surgery consulted -> anticipate PEG placement Monday . Speech and dietician on board . Diarrhea stopped with TF on hold - continue imodium scheduled  Other Stroke Risk Factors  Advanced age  Coronary artery disease  Chronic systolic Congestive heart failure  Other Active Problems  GERD on PPI   BPH on flomax PTA  Prostate cancer   Hypokalemia - resolved 3.4->4.0->3.4 - supplement and recheck  LBBB. Appears old. EKG neg ischemia. Trops 27->32  (Likely strain related)  Small blood clots in loose stool with  recurrence. Hgb (10.9->11.0->10.5->11.0->11.3->9.5). Continue to monitor.  No indication for colonoscopy  Loose stool on imodium scheduled  Thrombocytosis - 460->500->437  Hospital Day #17   [name], MD PhD Stroke Neurology 07/26/2019 6:42 PM  ADDENDUM:  I had long discussion with wife Stephen Copeland over the phone, updated pt current condition, treatment plan and potential prognosis, and answered all the questions. She expressed understanding and appreciation.   [name], MD PhD Stroke Neurology 07/26/2019 7:19 PM   To contact Stroke Continuity provider, please refer to [email or web address] After hours, contact General Neurology

## 2019-07-26 NOTE — Plan of Care (Signed)
  Problem: Education: Goal: Knowledge of General Education information will improve Description: Including pain rating scale, medication(s)/side effects and non-pharmacologic comfort measures Outcome: Progressing   Problem: Pain Managment: Goal: General experience of comfort will improve Outcome: Progressing   Problem: Safety: Goal: Ability to remain free from injury will improve Outcome: Progressing   

## 2019-07-27 ENCOUNTER — Inpatient Hospital Stay (HOSPITAL_COMMUNITY): Payer: Medicare Other | Admitting: Anesthesiology

## 2019-07-27 LAB — BASIC METABOLIC PANEL
Anion gap: 11 (ref 5–15)
BUN: 15 mg/dL (ref 8–23)
CO2: 21 mmol/L — ABNORMAL LOW (ref 22–32)
Calcium: 8.7 mg/dL — ABNORMAL LOW (ref 8.9–10.3)
Chloride: 112 mmol/L — ABNORMAL HIGH (ref 98–111)
Creatinine, Ser: 1.29 mg/dL — ABNORMAL HIGH (ref 0.61–1.24)
GFR calc Af Amer: >60 mL/min (ref >60)
GFR calc non Af Amer: 53 mL/min — ABNORMAL LOW (ref >60)
Glucose, Bld: 118 mg/dL — ABNORMAL HIGH (ref 70–99)
Potassium: 4.2 mmol/L (ref 3.5–5.1)
Sodium: 144 mmol/L (ref 135–145)

## 2019-07-27 LAB — GLUCOSE, CAPILLARY
Glucose-Capillary: 105 mg/dL — ABNORMAL HIGH (ref 70–99)
Glucose-Capillary: 107 mg/dL — ABNORMAL HIGH (ref 70–99)
Glucose-Capillary: 114 mg/dL — ABNORMAL HIGH (ref 70–99)
Glucose-Capillary: 116 mg/dL — ABNORMAL HIGH (ref 70–99)
Glucose-Capillary: 117 mg/dL — ABNORMAL HIGH (ref 70–99)
Glucose-Capillary: 126 mg/dL — ABNORMAL HIGH (ref 70–99)
Glucose-Capillary: 135 mg/dL — ABNORMAL HIGH (ref 70–99)

## 2019-07-27 LAB — CBC
HCT: 36.4 % — ABNORMAL LOW (ref 39.0–52.0)
Hemoglobin: 11.7 g/dL — ABNORMAL LOW (ref 13.0–17.0)
MCH: 30.7 pg (ref 26.0–34.0)
MCHC: 32.1 g/dL (ref 30.0–36.0)
MCV: 95.5 fL (ref 80.0–100.0)
Platelets: 509 10*3/uL — ABNORMAL HIGH (ref 150–400)
RBC: 3.81 MIL/uL — ABNORMAL LOW (ref 4.22–5.81)
RDW: 13.6 % (ref 11.5–15.5)
WBC: 9.9 10*3/uL (ref 4.0–10.5)
nRBC: 0 % (ref 0.0–0.2)

## 2019-07-27 NOTE — Anesthesia Preprocedure Evaluation (Deleted)
Anesthesia Evaluation  Patient identified by MRN, date of birth, ID band Patient awake    Reviewed: Allergy & Precautions, H&P , NPO status , Patient's Chart, lab work & pertinent test results  Airway Mallampati: II  TM Distance: >3 FB Neck ROM: Full    Dental no notable dental hx. (+) Poor Dentition, Dental Advisory Given   Pulmonary neg pulmonary ROS,    Pulmonary exam normal breath sounds clear to auscultation       Cardiovascular Exercise Tolerance: Good hypertension, Pt. on medications + CAD and +CHF   Rhythm:Regular Rate:Tachycardia     Neuro/Psych CVA, Residual Symptoms negative psych ROS   GI/Hepatic Neg liver ROS, hiatal hernia, PUD, GERD  ,  Endo/Other  negative endocrine ROS  Renal/GU Renal disease  negative genitourinary   Musculoskeletal   Abdominal   Peds  Hematology negative hematology ROS (+)   Anesthesia Other Findings   Reproductive/Obstetrics negative OB ROS                            Anesthesia Physical Anesthesia Plan  ASA: III  Anesthesia Plan: General   Post-op Pain Management:    Induction: Intravenous  PONV Risk Score and Plan: 2 and Propofol infusion, Ondansetron and Dexamethasone  Airway Management Planned: Oral ETT  Additional Equipment:   Intra-op Plan:   Post-operative Plan: Extubation in OR  Informed Consent: I have reviewed the patients History and Physical, chart, labs and discussed the procedure including the risks, benefits and alternatives for the proposed anesthesia with the patient or authorized representative who has indicated his/her understanding and acceptance.     Dental advisory given  Plan Discussed with: CRNA  Anesthesia Plan Comments:       Anesthesia Quick Evaluation

## 2019-07-27 NOTE — Progress Notes (Addendum)
Nutrition Follow-up  DOCUMENTATION CODES:   Not applicable  INTERVENTION:  Once PEG is placed and ready for use, begin: Jevity 1.5 @ 60 ml/hr (1453m per day) 30 ml Prostat daily 1549mfree water Q6H  Provides: 2260 kcal, 106 grams protein, and 1094 ml free water (169476motal free water with flushes).    NUTRITION DIAGNOSIS:   Inadequate oral intake related to inability to eat as evidenced by NPO status.  Ongoing.  GOAL:   Patient will meet greater than or equal to 90% of their needs  Will be met with TF.   MONITOR:   TF tolerance, Labs  REASON FOR ASSESSMENT:   Consult Calorie Count  ASSESSMENT:   Pt with PMH of CHF, CAD, cardiomyopathy, hiatal hernia, HLD, and HTN now admitted with R CVA s/p thrombectomy and carotid stent.  5/6 admitted s/p IR, remained intubated post-op 5/7 extubated; cortrak placed  5/10 N/V with normal KUB; intubated 5/11 extubated 5/12 failed swallow eval; cortrak replaced as tube was inadvertently pulled with extubation 5/17 diet upgraded to dysphagia 1 with honey thick liquids 5/18 TF switched to nocturnal 5/20 TF returned to continuous due to poor po intake 5/22 TF stopped per MD due to pt having diarrhea  Pt irritated by Cortrak and had been c/o diarrhea. MD discontinued TF orders to provide relief from diarrhea and scheduled Imodium. Pt reporting that diarrhea has improved since TF was discontinued. Per MD, pt is agreeable to having PEG placed. Anticipated placement today. RD will recommend switching pt to fiber-containing formula to be used s/p PEG placement.   Labs reviewed: CBGs 107-126 Medications reviewed and include: 150m72mee water Q6H, Novolog, Imodium   Diet Order:   Diet Order            DIET - DYS 1 Room service appropriate? Yes; Fluid consistency: Honey Thick  Diet effective now              EDUCATION NEEDS:   No education needs have been identified at this time  Skin:  Skin Assessment: Skin Integrity  Issues: Skin Integrity Issues:: Incisions, Other (Comment) Incisions: R groin Other: MASD buttocks  Last BM:  5/23 type 6  Height:   Ht Readings from Last 1 Encounters:  07/15/19 '6\' 2"'  (1.88 m)    Weight:   Wt Readings from Last 1 Encounters:  07/27/19 82.9 kg    BMI:  Body mass index is 23.47 kg/m.  Estimated Nutritional Needs:   Kcal:  2100-2300  Protein:  100-115 grams  Fluid:  >2 L/day   AmanLarkin Ina, RD, LDN RD pager number and weekend/on-call pager number located in [email or web address]

## 2019-07-27 NOTE — Plan of Care (Signed)
°  Problem: Clinical Measurements: °Goal: Respiratory complications will improve °Outcome: Progressing °Goal: Cardiovascular complication will be avoided °Outcome: Progressing °  °Problem: Coping: °Goal: Level of anxiety will decrease °Outcome: Progressing °  °Problem: Pain Managment: °Goal: General experience of comfort will improve °Outcome: Progressing °  °Problem: Safety: °Goal: Ability to remain free from injury will improve °Outcome: Progressing °  °

## 2019-07-27 NOTE — Progress Notes (Signed)
Occupational Therapy Treatment Patient Details Name: Stephen Copeland MRN: [id number] DOB: [birth year] Today's Date: 07/27/2019    History of present illness 79 y.o. male admitted on 07/09/19 for L sided weakness, facial droop, and slurred speech.  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  He did not get any tPA as he was outside of the window.  Pt intubated 5/6 for procedure and extubated in the AM of 07/10/19.  Pt with other significant PMH of sinus brady, scoliosis, prostate CA s/p surgery, HTN, dilated cardiomyopathy, CAD, CHF.  Patient re-intubated 5/10 with inability to clear secretions.. Extubated 5/11.    OT comments  Patient supine in bed, soiled.  Assisted to EOB with max assist +2, NT assisted with linen change. Brief standing at EOB with max assist +2.  Patient presenting confused, agitated at times, and requires max encouragement to participate. Requires multimodal, hand over hand cueing with poor awareness to safety or deficits; at one point reporting "I know I can just walk out of here"; requires constant redirection.  Will follow acutely. Met 0/5 goals.    Follow Up Recommendations  SNF;Supervision/Assistance - 24 hour    Equipment Recommendations  Other (comment)(TBD at next venue of care)    Recommendations for Other Services      Precautions / Restrictions Precautions Precautions: Fall Precaution Comments: coretrack Restrictions Weight Bearing Restrictions: No       Mobility Bed Mobility Overal bed mobility: Needs Assistance Bed Mobility: Rolling;Sidelying to Sit;Sit to Supine Rolling: Max assist;+2 for physical assistance Sidelying to sit: Max assist;+2 for physical assistance;+2 for safety/equipment Supine to sit: Total assist;+2 for physical assistance;+2 for safety/equipment     General bed mobility comments: max assist +2 for rolling to change pad, pt able to initate BLEs towards R side of bed but increased time and required  assist +2 for remainder of all bed mobility   Transfers Overall transfer level: Needs assistance Equipment used: 2 person hand held assist Transfers: Sit to/from Stand Sit to Stand: Max assist;+2 physical assistance;+2 safety/equipment         General transfer comment: max assist to power up and steady from EOB with cueing for hand placement and no carryover (placing hand on rolling bedside table and unable to redirect), once standing determined to walk out of here and requires tactile input to return to sitting     Balance Overall balance assessment: Needs assistance Sitting-balance support: Feet supported Sitting balance-Leahy Scale: Fair Sitting balance - Comments: min guard statically   Standing balance support: Bilateral upper extremity supported;During functional activity Standing balance-Leahy Scale: Poor Standing balance comment: relies on external and BUE support                            ADL either performed or assessed with clinical judgement   ADL Overall ADL's : Needs assistance/impaired                             Toileting- Clothing Manipulation and Hygiene: Total assistance;Bed level;+2 for physical assistance;+2 for safety/equipment Toileting - Clothing Manipulation Details (indicate cue type and reason): incontinent BM/urine     Functional mobility during ADLs: Maximal assistance;+2 for physical assistance;+2 for safety/equipment;Cueing for safety;Cueing for sequencing General ADL Comments: max assist +2 for bed mobility and short stand at EOB; limited by cognition and requires constant redirection      Vision  Perception     Praxis      Cognition Arousal/Alertness: Lethargic Behavior During Therapy: Agitated;Restless;Impulsive Overall Cognitive Status: Impaired/Different from baseline Area of Impairment: Attention;Memory;Following commands;Safety/judgement;Problem solving;Awareness                   Current  Attention Level: Focused Memory: Decreased short-term memory;Decreased recall of precautions Following Commands: Follows one step commands inconsistently;Follows one step commands with increased time Safety/Judgement: Decreased awareness of safety;Decreased awareness of deficits Awareness: Intellectual Problem Solving: Decreased initiation;Difficulty sequencing;Requires verbal cues;Requires tactile cues;Slow processing General Comments: pt resistant to therapy, requires maximal encouragment to engage in bed linen change due to incontience of bladder and bowel; pt requires increased time for all tasks, multimodal cueing and poor awareness of deficits         Exercises     Shoulder Instructions       General Comments RN and NT present to assist during session    Pertinent Vitals/ Pain       Pain Assessment: Faces Faces Pain Scale: Hurts a little bit Pain Location: neck, spasms  Pain Descriptors / Indicators: Discomfort;Grimacing;Spasm Pain Intervention(s): Repositioned;Limited activity within patient's tolerance;Monitored during session  Home Living                                          Prior Functioning/Environment              Frequency  Min 2X/week        Progress Toward Goals  OT Goals(current goals can now be found in the care plan section)  Progress towards OT goals: Not progressing toward goals - comment(pt self limiting, requires max encouragement, resistant )  Acute Rehab OT Goals OT Goal Formulation: Patient unable to participate in goal setting  Plan Discharge plan remains appropriate;Frequency remains appropriate    Co-evaluation                 AM-PAC OT "6 Clicks" Daily Activity     Outcome Measure   Help from another person eating meals?: A Lot Help from another person taking care of personal grooming?: Total Help from another person toileting, which includes using toliet, bedpan, or urinal?: Total Help from another  person bathing (including washing, rinsing, drying)?: A Lot Help from another person to put on and taking off regular upper body clothing?: Total Help from another person to put on and taking off regular lower body clothing?: Total 6 Click Score: 8    End of Session Equipment Utilized During Treatment: Gait belt;Oxygen  OT Visit Diagnosis: Unsteadiness on feet (R26.81);Other abnormalities of gait and mobility (R26.89);Muscle weakness (generalized) (M62.81);Pain;Other symptoms and signs involving cognitive function Pain - part of body: (neck)   Activity Tolerance Treatment limited secondary to agitation   Patient Left in bed;with call bell/phone within reach;with bed alarm set;with nursing/sitter in room   Nurse Communication Mobility status        Time: 3893-7342 OT Time Calculation (min): 38 min  Charges: OT General Charges $OT Visit: 1 Visit OT Treatments $Self Care/Home Management : 38-52 mins  [place] Pager [phone] Office [phone]    Delight Stare 07/27/2019, 2:54 PM

## 2019-07-27 NOTE — TOC Progression Note (Signed)
Transition of Care [facility]) - Progression Note    Patient Details  Name: Stephen Copeland MRN: [id number] Date of Birth: 1941-03-04  Transition of Care [facility]) CM/SW [place], [place] Work Phone Number: 07/27/2019, 10:47 AM  Clinical Narrative:    MSW Intern received contact from [facility] that they are now accepting patients again. Pt and spouse noted that they would prefer [facility], but chose [place] when [facility] was [employer]. Pt's spouse confirmed she would prefer [facility] and would like that to be their bed choice. MSW Intern noted she would touch base with pt as well. Pt was sleeping, will attempt again later.   Expected Discharge Plan: [place] Barriers to Discharge: Continued Medical Work up  Expected Discharge Plan and Services Expected Discharge Plan: [place] arrangements for the past 2 months: Single Family Home                                       Social Determinants of Health (SDOH) Interventions    Readmission Risk Interventions No flowsheet data found.

## 2019-07-27 NOTE — Progress Notes (Signed)
  Speech Language Pathology Treatment: Cognitive-Linquistic  Patient Details Name: Stephen Copeland MRN: [id number] DOB: 07/07/1940 Today's Date: 07/27/2019 Time: 8333-8329 SLP Time Calculation (min) (ACUTE ONLY): 11 min  Assessment / Plan / Recommendation Clinical Impression  Treatment focused primarily on cognitive goals as pt refused all PO intake. He did get a small amount of honey thick liquids into his oral cavity x1, but this was suctioned back out as he did not make attempts to push it posteriorly toward his pharynx. SLP provided Max cues for sustained attention to simple verbal tasks. He responded to questions only intermittently, and often off-topic. He could not sustain attention to basic verbal sequencing or divergent naming tasks. Will continue to follow as able.   HPI HPI: 79 y/o M admitted 5/6 with right sided CVA and remained intubated post thrombectomy and carotid stent. He was outside the TPA window. Intubated 5/6-5/7, then on 5/10 pt had multiple episodes of vomiting brown gastric contents and was reintubted. Pt was seen by SLP in the interim, found to have acute neurogenic dysphagia with signs of aspiration with minimal PO trials. Has cortrak, on D1/HTL diet.      SLP Plan  Continue with current plan of care       Recommendations  Diet recommendations: Dysphagia 1 (puree);Honey-thick liquid Liquids provided via: Teaspoon Medication Administration: Via alternative means Supervision: Full supervision/cueing for compensatory strategies;Trained caregiver to feed patient Compensations: Slow rate;Small sips/bites;Minimize environmental distractions Postural Changes and/or Swallow Maneuvers: Seated upright 90 degrees                Oral Care Recommendations: Oral care QID;Staff/trained caregiver to provide oral care Follow up Recommendations: Skilled Nursing facility SLP Visit Diagnosis: Cognitive communication deficit (V91.660) Plan: Continue with current plan of  care       GO                 Osie Bond., M.A. [place] [facility] Pager [phone] Office [phone]  07/27/2019, 2:58 PM

## 2019-07-27 NOTE — Progress Notes (Signed)
STROKE TEAM PROGRESS NOTE   INTERVAL HISTORY: Patient is sitting up in bed.  His speech is slurred.  Follows simple commands.  His diarrhea has improved as he is n.p.o.  Trauma team has been consulted for PEG tube which will possibly be done tomorrow.  Vital signs stable.  No neurological changes.  Vitals:   07/27/19 0727 07/27/19 0757 07/27/19 0845 07/27/19 1136  BP: (!) 148/91 (!) 168/92 (!) 158/88 (!) 168/89  Pulse: (!) 122 (!) 110 (!) 109 83  Resp: 20 18 16 20   Temp: 99.3 F (37.4 C) 98.8 F (37.1 C) 98.4 F (36.9 C) 98.1 F (36.7 C)  TempSrc: Oral   Oral  SpO2: 96% 97% 96% 97%  Weight:      Height:       CBC:  Recent Labs  Lab 07/26/19 0356 07/27/19 0420  WBC 8.1 9.9  HGB 9.5* 11.7*  HCT 30.0* 36.4*  MCV 96.5 95.5  PLT 437* 725*   Basic Metabolic Panel:  Recent Labs  Lab 07/26/19 0356 07/27/19 0420  NA 139 144  K 3.4* 4.2  CL 109 112*  CO2 24 21*  GLUCOSE 114* 118*  BUN 22 15  CREATININE 1.46* 1.29*  CALCIUM 7.9* 8.7*    IMAGING past 24 hours No results found.   PHYSICAL EXAM     General - frail elderly malnourished looking Caucasian, not in acute distress  Ophthalmologic - fundi not visualized due to noncooperation.  Cardiovascular - Regular rhythm and rate.   Neurological Exam - Patient is awake alert,oriented.  Follows simple commands and no aphasia. Able to name and repeat. Speech mildly dysarthric. Visual field full, no hemianopia. Gaze bilaterally without difficulty, PERRL. Tracking bilaterally. Left mild facial droop. Tongue midline. BUE 4/5. BLE 3/5 proximal and distal. DTR 1+ and no babinski. Sensation symmetrical and coordination intact b/l FTN. Gait not tested.    ASSESSMENT/PLAN [name] is a 79 y.o. male with history of Afib, systolic CHF, HLD, CAD, HTN, prostate cancer presenting with left sided weakness, facial droop, dysarthria.   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 embolic secondary to known AF even on Eliquis  Code Stroke CT head evolving R MCA territory infarct (insula, cerebral cortex). hyperdense R M2 +/- M3. ASPECTS 7    CTA head & neck +LVO. Probable ruptured plaque R ICA bifurcation w/ near occlusive stenosis, possible superimposed dissection. Distal reconstitution R M1 from collaterals but w/ distal embolic occlusion proximal R M2/3 superior division branch. L ICA bifurcation and siphon atherosclerosis. B VA origins 50% stenoses. R subclavian artery origin 60% stenosis.   CT perfusion 19cc core infarct R insula and posterior frontal. 33cc penumbra.   Cerebral angio TICI3 revascularization occluded middle trifurcation R MCA branch, revascularization R ICA occlusion w/ angioplasty and stenting.   Post IR CT contrast stain R putamen and moderate R sylvian fissure, SAH + contrast.    CT head R sylvian fissure SAH. Increased contrast vs hemorrhage R suprasellar cistern following ICA concerned for increased SAH. R sulci effacement.   Carotid Doppler  R ICA stent patent  MRI  Patchy R MCA infarcts. SAH R sylvian fissure. Small IVH.   MRA - Severely motion degraded head MRA. Persistent patency of the revascularized right ICA. No flow limiting proximal stenosis identified.   CT head repeat 5/11  R insular infarct stable. Residual small SAH and small IVH.  CT head repeat 5/13 evolving R MCA infarct, SAH unchanged   2D Echo EF 35-40%. No source of embolus   LDL 90  HgbA1c 5.8  SCDs for VTE prophylaxis  Eliquis (apixaban) daily prior to admission, now on aspirin 81 mg daily and Brilinta (ticagrelor) 90 mg bid. AC so far on hold given continued SAH and IVH on CT, and PEG placement. Consider to repeat CT next week after PEG and decide on [facility]  Therapy recommendations:  CIR - > pt w/o family support at home. Will need SNF  Disposition:   pending  Acute hypoxemic respiratory failure, resolved Likely Aspiration PNA, treated  Extubated w/ CCM signing off 5/8  Vomiting x 5 with possible aspiration requiring NRB 5/10. CCM reconsulted. Transferred to ICU and Intubated  Extubated 5/11 w/o difficulties  Unasyn 5/10>>5/17 for 7 days  Leukocytosis - resolved  CXR - slight improvement aeration LLL w/ atx/infiltrate. Cardiomegaly.   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 Hold AC given post IR SAH and need for DAPT post stent placement. . Bradycardia resolved off sedation . Consider AC once SAH resolves and PEG placed, will consider to repeat CT next week after PEG.   Hypertension  Home meds:  Hydralazine 50 tid, metoprolol 25 bid  On Hydralazine 25 mg Q8 and metoprolol 12.5 bid . SBP 120s, on the low end  . Long-term BP goal normotensive  Hyperlipidemia  Home meds:  No statin  LDL 90, goal < 70  May consider statin again ->pt adamantly has refused statins in the past  AKI on CKD   Cre 1.29  Renal US R w/ increased echogenicity. 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 Secondary to stroke . SLP difficulty d/t pt participation. . Trial D1 puree w/ honey thick liquids -> pt not swallowing and eat enough for daily needs -> Back to continuous feeding -> off TF . On free water 150 mL q6h . Encourage po intake . Pt  then irritated by cortrak and persistent diarrhea -> hold off TF and on IVF . Discussed with wife in length, and she now is agreeable for PEG, pt is agreeable with PEG -> surgery consulted -> anticipate PEG placement Monday . Speech and dietician on board . Diarrhea stopped with TF on hold - continue imodium scheduled  Other Stroke Risk Factors  Advanced age  Coronary artery disease  Chronic systolic Congestive heart failure  Other Active Problems  GERD on PPI   BPH on flomax PTA  Prostate cancer   Hypokalemia - resolved 3.4->4.0->3.4 - supplement and recheck  LBBB. Appears old. EKG neg ischemia. Trops 27->32  (Likely strain related)  Small blood clots in loose stool with recurrence. Hgb 11.7. Continue to monitor.  No indication for colonoscopy  Loose stool  on imodium scheduled  Thrombocytosis - [facility] Day #18   Continue to keep nil by mouth till after PEG tube.  Continue IV hydration.  Discussed with Dr. Bobbye Morton from trauma team and PEG tube is planned for tomorrow.  No family available at the bedside for discussion.  Greater than 50% time during this 25-minute visit was spent in counseling and coordination of care and discussion with care team Antony Contras, MD  To contact Stroke Continuity provider, please refer to [email or web address] After hours, contact General Neurology

## 2019-07-28 ENCOUNTER — Encounter (HOSPITAL_COMMUNITY)
Admission: EM | Disposition: A | Discharge status: Skilled Nursing Facility | Payer: Self-pay | Source: Home / Self Care | Attending: Neurology

## 2019-07-28 ENCOUNTER — Encounter (HOSPITAL_COMMUNITY): Payer: Self-pay | Admitting: Student in an Organized Health Care Education/Training Program

## 2019-07-28 LAB — GLUCOSE, CAPILLARY
Glucose-Capillary: 132 mg/dL — ABNORMAL HIGH (ref 70–99)
Glucose-Capillary: 108 mg/dL — ABNORMAL HIGH (ref 70–99)
Glucose-Capillary: 122 mg/dL — ABNORMAL HIGH (ref 70–99)
Glucose-Capillary: 124 mg/dL — ABNORMAL HIGH (ref 70–99)
Glucose-Capillary: 121 mg/dL — ABNORMAL HIGH (ref 70–99)
Glucose-Capillary: 89 mg/dL (ref 70–99)

## 2019-07-28 SURGERY — CANCELLED PROCEDURE
Anesthesia: General

## 2019-07-28 MED ORDER — OSMOLITE 1.2 CAL PO LIQD
1000.0000 mL | ORAL | Status: DC
  Administered 2019-07-28: 1000 mL
  Filled 2019-07-28 (×2): qty 1000

## 2019-07-28 MED ORDER — METOPROLOL TARTRATE 25 MG/10 ML ORAL SUSPENSION
25.0000 mg | Freq: Two times a day (BID) | ORAL | Status: DC
  Administered 2019-07-28 – 2019-08-26 (×56): 25 mg
  Filled 2019-07-28 (×62): qty 10

## 2019-07-28 NOTE — Progress Notes (Signed)
STROKE TEAM PROGRESS NOTE   INTERVAL HISTORY: Patient went to endoscopy suite for elective PEG tube placement by trauma team but was found to be tachycardic with heart rate in the 161W and systolic blood pressure in the 180s and conscious sedation was not felt to be right for him.  General anesthesia risk-benefit was discussed with patient's wife but she refused consent stating she needed time to think about it.  Patient is back in his room.  He remains dysarthric and dysphasic.  He is agreeable to PEG tube under general anesthesia.  I spoke to the patient's wife after trying to reach her repeatedly she states she is not willing for PEG under general anesthesia at the present time and would like a few days to decide.  She will not be able to come to the patient's bedside till Thursday.  She wants [place] to increase the patient to eat orally and if his intake does not increase she may agree to PEG tube under general anesthesia on Friday.  Vitals:   07/28/19 0500 07/28/19 0740 07/28/19 0802 07/28/19 0942  BP:  (!) 143/92 (!) 168/74 (!) 146/90  Pulse:  (!) 115 (!) 123 70  Resp:  20 (!) 23 20  Temp:  99.7 F (37.6 C) 100 F (37.8 C) 99 F (37.2 C)  TempSrc:  Oral Axillary Oral  SpO2:  97% 96% 96%  Weight: 82 kg  82 kg   Height:   6\' 2"  (1.88 m)    CBC:  Recent Labs  Lab 07/26/19 0356 07/27/19 0420  WBC 8.1 9.9  HGB 9.5* 11.7*  HCT 30.0* 36.4*  MCV 96.5 95.5  PLT 437* 960*   Basic Metabolic Panel:  Recent Labs  Lab 07/26/19 0356 07/27/19 0420  NA 139 144  K 3.4* 4.2  CL 109 112*  CO2 24 21*  GLUCOSE 114* 118*  BUN 22 15  CREATININE 1.46* 1.29*  CALCIUM 7.9* 8.7*    IMAGING past 24 hours No results found.   PHYSICAL EXAM      General - frail elderly malnourished looking Caucasian, not in acute distress  Ophthalmologic - fundi not visualized due to noncooperation.  Cardiovascular - Regular rhythm and rate.   Neurological Exam - Patient is awake alert,oriented.  Follows  simple commands and no aphasia. Able to name and repeat. Speech mildly dysarthric. Visual field full, no hemianopia. Gaze bilaterally without difficulty, PERRL. Tracking bilaterally. Left mild facial droop. Tongue midline. BUE 4/5. BLE 3/5 proximal and distal. DTR 1+ and no babinski. Sensation symmetrical and coordination intact b/l FTN. Gait not tested.    ASSESSMENT/PLAN Stephen Copeland is a 79 y.o. male with history of Afib, systolic CHF, HLD, CAD, HTN, prostate cancer presenting with left sided weakness, facial droop, dysarthria.   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 embolic secondary to known AF even on Eliquis  Code Stroke CT head evolving R MCA territory infarct (insula, cerebral cortex). hyperdense R M2 +/- M3. ASPECTS 7    CTA head & neck +LVO. Probable ruptured plaque R ICA bifurcation w/ near occlusive stenosis, possible superimposed dissection. Distal reconstitution R M1 from collaterals but w/ distal embolic occlusion proximal R M2/3 superior division branch. L ICA bifurcation and siphon atherosclerosis. B VA origins 50% stenoses. R subclavian artery origin 60% stenosis.   CT perfusion 19cc  core infarct R insula and posterior frontal. 33cc penumbra.   Cerebral angio TICI3 revascularization occluded middle trifurcation R MCA branch, revascularization R ICA occlusion w/ angioplasty and stenting.   Post IR CT contrast stain R putamen and moderate R sylvian fissure, SAH + contrast.    CT head R sylvian fissure SAH. Increased contrast vs hemorrhage R suprasellar cistern following ICA concerned for increased SAH. R sulci effacement.   Carotid Doppler  R ICA stent patent  MRI  Patchy R MCA infarcts. SAH R sylvian fissure. Small IVH.   MRA - Severely motion degraded head MRA. Persistent patency of the revascularized right ICA. No flow  limiting proximal stenosis identified.   CT head repeat 5/11 R insular infarct stable. Residual small SAH and small IVH.  CT head repeat 5/13 evolving R MCA infarct, SAH unchanged   2D Echo EF 35-40%. No source of embolus   LDL 90  HgbA1c 5.8  SCDs for VTE prophylaxis  Eliquis (apixaban) daily prior to admission, now on aspirin 81 mg daily and Brilinta (ticagrelor) 90 mg bid. AC so far on hold given continued SAH and IVH on CT, and PEG placement. Consider to repeat CT next week after PEG and decide on [facility]  Therapy recommendations:  CIR - > pt w/o family support at home. Will need SNF  Disposition:  pending  Acute hypoxemic respiratory failure, resolved Likely Aspiration PNA, treated  Extubated w/ CCM signing off 5/8  Vomiting x 5 with possible aspiration requiring NRB 5/10. CCM reconsulted. Transferred to ICU and Intubated  Extubated 5/11 w/o difficulties  Unasyn 5/10>>5/17 for 7 days  Leukocytosis - resolved  CXR - slight improvement aeration LLL w/ atx/infiltrate. Cardiomegaly.   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 Hold AC given post IR SAH and need for DAPT post stent placement. . Bradycardia resolved off sedation . On metoprolol 12.5 bid w/ tachycardia past 24h - increase metoprolol to 25 bid  . Consider AC once SAH resolves and PEG placed, will consider to repeat CT next week after PEG.   Hypertension  Home meds:  Hydralazine 50 tid, metoprolol 25 bid  On Hydralazine 25 mg Q8 and metoprolol 25 bid . SBP 120s, on the low end  . Long-term BP goal normotensive  Hyperlipidemia  Home meds:  No statin  LDL 90, goal < 70  May consider  statin again ->pt adamantly has refused statins in the past  AKI on CKD   Cre 1.29  Renal US R w/ increased echogenicity. 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 Secondary to stroke . SLP difficulty d/t pt participation. . Trial D1 puree w/ honey thick liquids -> pt not swallowing and eat enough for daily needs -> Back to continuous feeding -> off TF . On free water 150 mL q6h . Encourage po intake . Pt  then irritated by cortrak and persistent diarrhea -> hold off TF and on IVF . Discussed with wife in length, and she now is agreeable for PEG, pt is agreeable with PEG -> surgery consulted -> anticipate PEG placement Monday . Speech and dietician on board . Diarrhea stopped with TF on hold - continue imodium scheduled  Other Stroke Risk Factors  Advanced age  Coronary artery disease  Chronic systolic Congestive heart failure  Other Active Problems  GERD on PPI   BPH on flomax PTA  Prostate cancer   Hypokalemia - resolved 3.4->4.0->3.4 - supplement and recheck  LBBB. Appears old. EKG neg ischemia. Trops 27->32  (Likely strain related)  Small blood clots in loose stool with recurrence. Hgb 11.7. Continue to monitor.  No indication for colonoscopy  Loose stool on imodium scheduled  Thrombocytosis - [facility] Day #19   I had a long discussion over the phone with the patient's wife regarding the frustrating situation where patient is on a diet but not eating enough to maintain his nourishment.  In order to progress his care he needs adequate nutrition and medications to be given consistently for which PEG tube may be necessary.  She wants to wait a few days and decide to give consent for PEG  tube under general anesthesia.  Marland Kitchen Resume dysphagia 1 diet and encourage oral intake.  Start cautious tube feeds at nighttime.  Meet with patient's wife on Thursday and make final decision about PEG tube under general anesthesia.  Discussed with Dr. Bobbye Morton and patient's wife and agree with plan.  Greater than 50% time during this 25-minute visit was spent on counseling and coordination of care and discussion with care team  Antony Contras, MD  To contact Stroke Continuity provider, please refer to [email or web address] After hours, contact General Neurology

## 2019-07-28 NOTE — Plan of Care (Signed)
  Problem: Clinical Measurements: Goal: Ability to maintain clinical measurements within normal limits will improve Outcome: Progressing   

## 2019-07-28 NOTE — Progress Notes (Signed)
Physical Therapy Cancellation Note   07/28/19 0800  PT Visit Information  Last PT Received On 07/28/19  Reason Eval/Treat Not Completed Patient at procedure or test/unavailable. PT will continue to follow acutely.    Earney Navy, PTA Acute Rehabilitation Services Pager: [phone] Office: [phone]

## 2019-07-28 NOTE — Plan of Care (Signed)
  Problem: Clinical Measurements: °Goal: Cardiovascular complication will be avoided °Outcome: Progressing °  °Problem: Activity: °Goal: Risk for activity intolerance will decrease °Outcome: Progressing °  °Problem: Coping: °Goal: Level of anxiety will decrease °Outcome: Progressing °  °

## 2019-07-28 NOTE — Progress Notes (Signed)
Physical Therapy Treatment Patient Details Name: Stephen Copeland MRN: [id number] DOB: 20-Feb-1941 Today's Date: 07/28/2019    History of Present Illness 79 y.o. male admitted on 07/09/19 for L sided weakness, facial droop, and slurred speech.  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  He did not get any tPA as he was outside of the window.  Pt intubated 5/6 for procedure and extubated in the AM of 07/10/19.  Pt with other significant PMH of sinus brady, scoliosis, prostate CA s/p surgery, HTN, dilated cardiomyopathy, CAD, CHF.  Patient re-intubated 5/10 with inability to clear secretions.. Extubated 5/11.     PT Comments    Patient in bed upon arrival and agreeable to participate only for hygiene and linen change. Pt requires max-total A +2 for bed mobility and once sitting EOB pt began returning to supine quickly. Pt is drowsy and with unintelligible speech at times. Pt on bed pan end of session and RN notified. Continue to progress as tolerated with anticipated d/c to SNF for further skilled PT services.     Follow Up Recommendations  SNF     Equipment Recommendations  Rolling walker with 5" wheels;Wheelchair (measurements PT);Wheelchair cushion (measurements PT);3in1 (PT)    Recommendations for Other Services Rehab consult     Precautions / Restrictions Precautions Precautions: Fall Precaution Comments: coretrack Restrictions Weight Bearing Restrictions: No    Mobility  Bed Mobility Overal bed mobility: Needs Assistance Bed Mobility: Rolling;Sit to Supine;Supine to Sit Rolling: Max assist;+2 for physical assistance   Supine to sit: Total assist;+2 for safety/equipment Sit to supine: Max assist;+2 for safety/equipment   General bed mobility comments: pt agreeable to sitting EOB for hygiene and linen change as pt's bed pads were soiled with urine; pt assisted minimally with bilat LE to get into sitting and once in sitting quickly began returning to  supine despite cues; assistance required to roll onto side for placement of bed pan and to change chuck pad; hand over hand assist to reach for rail otherwise pt not following cues for bed mobility   Transfers                    Ambulation/Gait                 Stairs             Wheelchair Mobility    Modified Rankin (Stroke Patients Only) Modified Rankin (Stroke Patients Only) Pre-Morbid Rankin Score: No symptoms Modified Rankin: Moderately severe disability     Balance                                            Cognition Arousal/Alertness: Lethargic Behavior During Therapy: Flat affect;Restless Overall Cognitive Status: Impaired/Different from baseline Area of Impairment: Attention;Memory;Following commands;Safety/judgement;Problem solving;Awareness                   Current Attention Level: Focused Memory: Decreased short-term memory Following Commands: Follows one step commands inconsistently;Follows one step commands with increased time Safety/Judgement: Decreased awareness of safety;Decreased awareness of deficits Awareness: Intellectual Problem Solving: Decreased initiation;Difficulty sequencing;Requires verbal cues;Requires tactile cues;Slow processing        Exercises      General Comments        Pertinent Vitals/Pain Pain Assessment: Faces Faces Pain Scale: Hurts even more Pain Location: neck and back  Pain  Descriptors / Indicators: Grimacing;Guarding;Moaning Pain Intervention(s): Limited activity within patient's tolerance;Monitored during session;Repositioned    Home Living                      Prior Function            PT Goals (current goals can now be found in the care plan section) Progress towards PT goals: Not progressing toward goals - comment(resists mobility)    Frequency    Min 3X/week      PT Plan Current plan remains appropriate    Co-evaluation               AM-PAC PT "6 Clicks" Mobility   Outcome Measure  Help needed turning from your back to your side while in a flat bed without using bedrails?: A Lot Help needed moving from lying on your back to sitting on the side of a flat bed without using bedrails?: A Lot Help needed moving to and from a bed to a chair (including a wheelchair)?: A Lot Help needed standing up from a chair using your arms (e.g., wheelchair or bedside chair)?: A Lot Help needed to walk in hospital room?: A Lot Help needed climbing 3-5 steps with a railing? : Total 6 Click Score: 11    End of Session   Activity Tolerance: Other (comment)(drowsy and resistant to mobility ) Patient left: with call bell/phone within reach;in bed;with bed alarm set;with restraints reapplied(bilat mitts) Nurse Communication: Mobility status;Other (comment)(pt on bed pan) PT Visit Diagnosis: Muscle weakness (generalized) (M62.81);Other abnormalities of gait and mobility (R26.89);Other symptoms and signs involving the nervous system (R29.898);Hemiplegia and hemiparesis Hemiplegia - Right/Left: Left Hemiplegia - dominant/non-dominant: Non-dominant Hemiplegia - caused by: Cerebral infarction     Time: 1206-1224 PT Time Calculation (min) (ACUTE ONLY): 18 min  Charges:  $Therapeutic Activity: 8-22 mins                     Earney Navy, PTA Acute Rehabilitation Services Pager: [phone] Office: [phone]     Darliss Cheney 07/28/2019, 3:32 PM

## 2019-07-28 NOTE — Progress Notes (Signed)
Procedure cancelled per MD Lovick. Report called to floor RN and transported pt back to room.

## 2019-07-28 NOTE — Progress Notes (Signed)
Patient seen and examined in pre-op. Irregular tachycardia to 120s, EKG obtained, unchanged from prior. Hypertensive to 180s. Patient is somnolent, but answers questions appropriately when aroused. The plan from the anesthesiology team is that he is at high risk of loss of airway during the procedure due to his current mental status, and as such, will intubate him for the procedure. I am full in agreement with this plan and my anesthesia colleagues and I have reservations about the ability to safely extubate post-op. I called the patient's wife, Lleyton Byers, to discuss this further and to confirm the desire to proceed given this new information. She was extremely reticent to make a decision, so I asked her if she had any other family to assist. She provided a contact number for Estell Harpin, [phone], the patient's younger sister. I conferenced Ms. Wilson in on the call via three-way and continued this discussion. At one point, I was asked "can't you just decide what you think is best?" and "don't you think not feeding him would cause him to suffer?" I explained that the decision must come from the patient's legal decision-maker after understanding the risks and benefits. I do think this family would benefit from additional support in the form of a palliative care consultation given the potential gravity of the decisions to be made in this elderly, ill patient. I discussed the entirety of my conversation with the family and my recommendations with Dr. Leonie Man, the primary attending.  Jesusita Oka, MD General and [facility]

## 2019-07-29 LAB — GLUCOSE, CAPILLARY
Glucose-Capillary: 106 mg/dL — ABNORMAL HIGH (ref 70–99)
Glucose-Capillary: 107 mg/dL — ABNORMAL HIGH (ref 70–99)
Glucose-Capillary: 120 mg/dL — ABNORMAL HIGH (ref 70–99)
Glucose-Capillary: 130 mg/dL — ABNORMAL HIGH (ref 70–99)
Glucose-Capillary: 130 mg/dL — ABNORMAL HIGH (ref 70–99)
Glucose-Capillary: 159 mg/dL — ABNORMAL HIGH (ref 70–99)

## 2019-07-29 MED ORDER — OSMOLITE 1.5 CAL PO LIQD
600.0000 mL | ORAL | Status: DC
  Administered 2019-07-29: 600 mL
  Filled 2019-07-29: qty 711

## 2019-07-29 MED ORDER — OSMOLITE 1.5 CAL PO LIQD
600.0000 mL | ORAL | Status: DC
  Filled 2019-07-29: qty 711

## 2019-07-29 MED ORDER — WHITE PETROLATUM EX OINT
TOPICAL_OINTMENT | CUTANEOUS | Status: AC
  Filled 2019-07-29: qty 28.35

## 2019-07-29 NOTE — Progress Notes (Signed)
Nutrition Follow-up  DOCUMENTATION CODES:   Not applicable  INTERVENTION:  -Calorie count per MD  -Magic cup TID with meals, each supplement provides 290 kcal and 9 grams of protein  -Adjust nocturnal feeding regimen via Cortrak: Osmolite 1.5 cal @ 60 ml/hr for 10 hours from 2000-0600  123ml free water Q6H  Nocturnal tube feeding will provide 900 kcals (meets 42% estimated needs), 38 grams protein (meets 38% estimated needs), 474ml free water (1050ml total free water with flushes)  NUTRITION DIAGNOSIS:   Inadequate oral intake related to inability to eat as evidenced by NPO status.  Progressing, pt now on Dysphagia 1 diet with honey thick liquids   GOAL:   Patient will meet greater than or equal to 90% of their needs  Progressing.   MONITOR:   TF tolerance, Labs  REASON FOR ASSESSMENT:   Consult Calorie Count  ASSESSMENT:   Pt with PMH of CHF, CAD, cardiomyopathy, hiatal hernia, HLD, and HTN now admitted with R CVA s/p thrombectomy and carotid stent.  5/6 admitted s/p IR, remained intubated post-op 5/7 extubated; cortrak placed  5/10 N/V with normal KUB; intubated 5/11 extubated, cortrak removed 5/12 failed swallow eval; cortrak replaced as tube was inadvertently pulled with extubation 5/17 diet upgraded to dysphagia 1 with honey thick liquids 5/18 TF switched to nocturnal 5/20 TF returned to continuous due to poor po intake 5/22 TF stopped per MD due to pt having diarrhea 5/25 Pt's wife asked to hold on PEG placement to see if the pt's po intake can be improved  RD consulted to transition pt back to nocturnal feeding and conduct another calorie count. Decision will be made tomorrow regarding need for PEG tube.   NP ordered nocturnal tube feeding yesterday -- Osmolite 1.2 cal @ 37ml/hr for 10 hours from 2000-0600, 172ml free water Q6H. This is providing 840 kcals (meets 40% of minimum estimated needs), 38 grams of protein (meets 38% of minimum estimated  needs), 596ml free water (1189ml total free water with flushes)  Discussed pt with RN. Calorie count will begin this morning.   Labs reviewed. CBGs  89-159 Medications reviewed and include: Novolog, imodium   Diet Order:   Diet Order            DIET - DYS 1 Room service appropriate? Yes; Fluid consistency: Honey Thick  Diet effective now              EDUCATION NEEDS:   No education needs have been identified at this time  Skin:  Skin Assessment: Skin Integrity Issues: Skin Integrity Issues:: Incisions, Other (Comment) Incisions: R groin Other: MASD buttocks  Last BM:  5/25  Height:   Ht Readings from Last 1 Encounters:  07/28/19 6\' 2"  (1.88 m)    Weight:   Wt Readings from Last 1 Encounters:  07/29/19 81.2 kg    Ideal Body Weight:  86.3 kg  BMI:  Body mass index is 22.98 kg/m.  Estimated Nutritional Needs:   Kcal:  2100-2300  Protein:  100-115 grams  Fluid:  >2 L/day   Larkin Ina, MS, RD, LDN RD pager number and weekend/on-call pager number located in [place].

## 2019-07-29 NOTE — Progress Notes (Signed)
  Speech Language Pathology Treatment: Dysphagia  Patient Details Name: Stephen Copeland MRN: [id number] DOB: 07-20-1940 Today's Date: 07/29/2019 Time: 9574-7340 SLP Time Calculation (min) (ACUTE ONLY): 13 min  Assessment / Plan / Recommendation Clinical Impression  Pt seen with untouched am tray in room Pt responded to therapist briefly, but struggled to sustain arousal. RN also reported he had been sleeping most of the am. SLP provided thorough oral care/tooth brushing given very dry mucosa. Mouth appears as though pt has not accepted any PO in days. Pt still kept eyes closed with minimal resistance and immediately fell back asleep. Could not keep pt awake enough to participate in cueing needed to address swallowing. Pt is not making progress.   HPI HPI: 79 y/o M admitted 5/6 with right sided CVA and remained intubated post thrombectomy and carotid stent. He was outside the TPA window. Intubated 5/6-5/7, then on 5/10 pt had multiple episodes of vomiting brown gastric contents and was reintubted. Pt was seen by SLP in the interim, found to have acute neurogenic dysphagia with signs of aspiration with minimal PO trials. Has cortrak, on D1/HTL diet.      SLP Plan  Continue with current plan of care       Recommendations  Diet recommendations: Dysphagia 1 (puree);Honey-thick liquid Liquids provided via: Teaspoon Medication Administration: Via alternative means Supervision: Full supervision/cueing for compensatory strategies;Trained caregiver to feed patient Compensations: Slow rate;Small sips/bites;Minimize environmental distractions                Oral Care Recommendations: Oral care QID Follow up Recommendations: Skilled Nursing facility SLP Visit Diagnosis: Cognitive communication deficit (Z70.964) Plan: Continue with current plan of care       GO               [name], MA [place] Pager [phone] Office [phone]  Lynann Beaver 07/29/2019, 2:06 PM

## 2019-07-29 NOTE — Progress Notes (Signed)
STROKE TEAM PROGRESS NOTE   INTERVAL HISTORY: Patient is sitting up in bed.  Remains dysarthric and has dysphagia.  He has not been eating well.  Vital signs are stable.  No changes.  Neurological exam unchanged  Vitals:   07/29/19 0400 07/29/19 0500 07/29/19 0735 07/29/19 1134  BP: 123/70  132/63 (!) 118/56  Pulse: 94  (!) 102 72  Resp:   20 18  Temp: 98 F (36.7 C)  98.2 F (36.8 C) 99.4 F (37.4 C)  TempSrc: Oral  Oral Oral  SpO2: 97%     Weight:  81.2 kg    Height:       CBC:  Recent Labs  Lab 07/26/19 0356 07/27/19 0420  WBC 8.1 9.9  HGB 9.5* 11.7*  HCT 30.0* 36.4*  MCV 96.5 95.5  PLT 437* 428*   Basic Metabolic Panel:  Recent Labs  Lab 07/26/19 0356 07/27/19 0420  NA 139 144  K 3.4* 4.2  CL 109 112*  CO2 24 21*  GLUCOSE 114* 118*  BUN 22 15  CREATININE 1.46* 1.29*  CALCIUM 7.9* 8.7*    IMAGING past 24 hours No results found.   PHYSICAL EXAM      General - frail elderly malnourished looking Caucasian, not in acute distress  Ophthalmologic - fundi not visualized due to noncooperation.  Cardiovascular - Regular rhythm and rate.   Neurological Exam - Patient is awake alert,oriented.  Follows simple commands and no aphasia. Able to name and repeat. Speech mildly dysarthric. Visual field full, no hemianopia. Gaze bilaterally without difficulty, PERRL. Tracking bilaterally. Left mild facial droop. Tongue midline. BUE 4/5. BLE 3/5 proximal and distal. DTR 1+ and no babinski. Sensation symmetrical and coordination intact b/l FTN. Gait not tested.    ASSESSMENT/PLAN Mr. Rondo Spittler is a 79 y.o. male with history of Afib, systolic CHF, HLD, CAD, HTN, prostate cancer presenting with left sided weakness, facial droop, dysarthria.   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 embolic secondary to known AF even on  Eliquis  Code Stroke CT head evolving R MCA territory infarct (insula, cerebral cortex). hyperdense R M2 +/- M3. ASPECTS 7    CTA head & neck +LVO. Probable ruptured plaque R ICA bifurcation w/ near occlusive stenosis, possible superimposed dissection. Distal reconstitution R M1 from collaterals but w/ distal embolic occlusion proximal R M2/3 superior division branch. L ICA bifurcation and siphon atherosclerosis. B VA origins 50% stenoses. R subclavian artery origin 60% stenosis.   CT perfusion 19cc core infarct R insula and posterior frontal. 33cc penumbra.   Cerebral angio TICI3 revascularization occluded middle trifurcation R MCA branch, revascularization R ICA occlusion w/ angioplasty and stenting.   Post IR CT contrast stain R putamen and moderate R sylvian fissure, SAH + contrast.    CT head R sylvian fissure SAH. Increased contrast vs hemorrhage R suprasellar cistern following ICA concerned for increased SAH. R sulci effacement.   Carotid Doppler  R ICA stent patent  MRI  Patchy R MCA infarcts. SAH R sylvian fissure. Small IVH.   MRA - Severely motion degraded head MRA. Persistent patency of the revascularized right ICA. No flow limiting proximal stenosis identified.   CT head repeat 5/11 R insular infarct stable. Residual small SAH and small IVH.  CT head repeat 5/13 evolving R MCA infarct, SAH unchanged  2D Echo EF 35-40%. No source of embolus   LDL 90  HgbA1c 5.8  SCDs for VTE prophylaxis  Eliquis (apixaban) daily prior to admission, now on aspirin 81 mg daily and Brilinta (ticagrelor) 90 mg bid. AC so far on hold given continued SAH and IVH on CT, and PEG placement. Consider to repeat CT next week after PEG and decide on [facility]  Therapy recommendations:  CIR - > pt w/o family support at home. Will need SNF  Disposition:  pending  Acute hypoxemic respiratory failure, resolved Likely Aspiration PNA, treated  Extubated w/ CCM signing off 5/8  Vomiting x 5 with possible  aspiration requiring NRB 5/10. CCM reconsulted. Transferred to ICU and Intubated  Extubated 5/11 w/o difficulties  Unasyn 5/10>>5/17 for 7 days  Leukocytosis - resolved  CXR - slight improvement aeration LLL w/ atx/infiltrate. Cardiomegaly.   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 Hold AC given post IR SAH and need for DAPT post stent placement. . Bradycardia resolved off sedation . On metoprolol 12.5 bid w/ tachycardia past 24h - increase metoprolol to 25 bid  . Consider AC once SAH resolves and PEG placed, will consider to repeat CT next week after PEG.   Hypertension  Home meds:  Hydralazine 50 tid, metoprolol 25 bid  On Hydralazine 25 mg Q8 and metoprolol 25 bid . SBP 120s, on the low end  . Long-term BP goal normotensive  Hyperlipidemia  Home meds:  No statin  LDL 90, goal < 70  May consider statin again ->pt adamantly has refused statins in the past  AKI on CKD   Cre 1.29  Renal US R w/ increased echogenicity. 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 Secondary to stroke . SLP difficulty d/t pt participation. . Trial D1 puree w/ honey thick liquids -> pt not swallowing and eat enough for daily needs -> Back to continuous feeding -> off TF . On free water 150 mL q6h . Encourage po intake . Pt then irritated by cortrak and persistent diarrhea -> hold off TF and on  IVF . Discussed with wife in length, and she now is agreeable for PEG, pt is agreeable with PEG -> surgery consulted -> anticipate PEG placement Monday . Speech and dietician on board . Diarrhea stopped with TF on hold - continue imodium scheduled  Other Stroke Risk Factors  Advanced age  Coronary artery disease  Chronic systolic Congestive heart failure  Other Active Problems  GERD on PPI   BPH on flomax PTA  Prostate cancer   Hypokalemia - resolved 3.4->4.0->3.4 - supplement and recheck  LBBB. Appears old. EKG neg ischemia. Trops 27->32  (Likely strain related)  Small blood clots in loose stool with recurrence. Hgb 11.7. Continue to monitor.  No indication for colonoscopy  Loose stool on imodium scheduled  Thrombocytosis - 509  Hospital Day #20   Patient encouraged to eat but has not been eating a lot.  Continue tube feeds at night.  Await discussion with his wife when she comes tomorrow and will likely need PEG tube under general anesthesia.  Antony Contras, MD  To contact Stroke Continuity [place], please refer to [email or web address] After hours, contact General Neurology

## 2019-07-30 LAB — GLUCOSE, CAPILLARY
Glucose-Capillary: 119 mg/dL — ABNORMAL HIGH (ref 70–99)
Glucose-Capillary: 108 mg/dL — ABNORMAL HIGH (ref 70–99)
Glucose-Capillary: 113 mg/dL — ABNORMAL HIGH (ref 70–99)
Glucose-Capillary: 124 mg/dL — ABNORMAL HIGH (ref 70–99)
Glucose-Capillary: 104 mg/dL — ABNORMAL HIGH (ref 70–99)

## 2019-07-30 MED ORDER — JEVITY 1.5 CAL/FIBER PO LIQD
1000.0000 mL | ORAL | Status: DC
  Administered 2019-07-30 – 2019-08-02 (×5): 1000 mL
  Filled 2019-07-30 (×7): qty 1000

## 2019-07-30 MED ORDER — PRO-STAT SUGAR FREE PO LIQD
30.0000 mL | Freq: Every day | ORAL | Status: DC
  Administered 2019-07-30 – 2019-08-25 (×26): 30 mL
  Filled 2019-07-30 (×28): qty 30

## 2019-07-30 NOTE — Progress Notes (Signed)
Occupational Therapy Treatment Patient Details Name: Stephen Copeland MRN: [id number] DOB: 02/24/1941 Today's Date: 07/30/2019    History of present illness 79 y.o. male admitted on 07/09/19 for L sided weakness, facial droop, and slurred speech.  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  He did not get any tPA as he was outside of the window.  Pt intubated 5/6 for procedure and extubated in the AM of 07/10/19.  Pt with other significant PMH of sinus brady, scoliosis, prostate CA s/p surgery, HTN, dilated cardiomyopathy, CAD, CHF.  Patient re-intubated 5/10 with inability to clear secretions.. Extubated 5/11.    OT comments  Patient seen in conjunction with PT to optimize patient engagement and mobilization.  Patient requires +2 max-total assist for bed mobility, progressed to min guard sitting EOB statically and attempted sit to stand with max assist +2 at EOB but unable to achieve full stand.  Patient incontinent of bowel at EOB with total assist required for linen change and hygiene.  Max assist for UB dressing bed level.  Remains limited by cognition, pain in neck/back, weakness and balance.  SNF remains appropriate.  Will follow.    Follow Up Recommendations  SNF;Supervision/Assistance - 24 hour    Equipment Recommendations  Other (comment)(TBD)    Recommendations for Other Services      Precautions / Restrictions Precautions Precautions: Fall Precaution Comments: coretrack Restrictions Weight Bearing Restrictions: No       Mobility Bed Mobility Overal bed mobility: Needs Assistance Bed Mobility: Supine to Sit;Sit to Sidelying;Rolling Rolling: Max assist;+2 for physical assistance;+2 for safety/equipment   Supine to sit: Total assist;+2 for physical assistance;+2 for safety/equipment   Sit to sidelying: Max assist;+2 for physical assistance;+2 for safety/equipment General bed mobility comments: pt requires max encouragement to transition to EOB  using modified helicopter transition to ease and manage pain in back/neck; returned to sidelying with support of trunk and LEs   Transfers Overall transfer level: Needs assistance Equipment used: Rolling walker (2 wheeled) Transfers: Sit to/from Stand Sit to Stand: Max assist;+2 physical assistance;+2 safety/equipment         General transfer comment: max assist +2 to power up and stedy into standing x 2, unable to acheive full stand     Balance Overall balance assessment: Needs assistance Sitting-balance support: Feet supported Sitting balance-Leahy Scale: Fair Sitting balance - Comments: min-mod assist initally, fading to min guard statically at EOB    Standing balance support: Bilateral upper extremity supported;During functional activity Standing balance-Leahy Scale: Poor Standing balance comment: relies on external and BUE support                            ADL either performed or assessed with clinical judgement   ADL Overall ADL's : Needs assistance/impaired                 Upper Body Dressing : Maximal assistance;Bed level Upper Body Dressing Details (indicate cue type and reason): to change [employer] Transfer Details (indicate cue type and reason): unable  Toileting- Clothing Manipulation and Hygiene: Total assistance;+2 for physical assistance;+2 for safety/equipment;Sit to/from stand;Bed level       Functional mobility during ADLs: Maximal assistance;+2 for physical assistance;+2 for safety/equipment;Rolling walker;Cueing for safety;Cueing for sequencing General ADL Comments: pt limited by pain in neck/back, assisted to EOB with max encouargement and pt incontient of bowels therefore returned to supine  Vision       Perception     Praxis      Cognition Arousal/Alertness: Awake/alert Behavior During Therapy: Flat affect;Restless Overall Cognitive Status: Impaired/Different from baseline Area of Impairment:  Attention;Memory;Following commands;Safety/judgement;Problem solving;Awareness                   Current Attention Level: Focused Memory: Decreased short-term memory Following Commands: Follows one step commands inconsistently;Follows one step commands with increased time Safety/Judgement: Decreased awareness of safety;Decreased awareness of deficits Awareness: Intellectual Problem Solving: Decreased initiation;Difficulty sequencing;Requires verbal cues;Requires tactile cues;Slow processing General Comments: pt with bowel incontience during session, requires max encouragement to participate with poor awareness of deficits, safety and multimodal cueing for problem solving         Exercises     Shoulder Instructions       General Comments NT in room upon exit assisting with bathing after incontient episode     Pertinent Vitals/ Pain       Pain Assessment: Faces Faces Pain Scale: Hurts little more Pain Location: neck and back  Pain Descriptors / Indicators: Grimacing;Guarding;Moaning Pain Intervention(s): Monitored during session;Repositioned;Limited activity within patient's tolerance  Home Living                                          Prior Functioning/Environment              Frequency  Min 2X/week        Progress Toward Goals  OT Goals(current goals can now be found in the care plan section)  Progress towards OT goals: Progressing toward goals  Acute Rehab OT Goals Patient Stated Goal: less pain  OT Goal Formulation: With patient  Plan Discharge plan remains appropriate;Frequency remains appropriate    Co-evaluation    PT/OT/SLP Co-Evaluation/Treatment: Yes Reason for Co-Treatment: Necessary to address cognition/behavior during functional activity;For patient/therapist safety;To address functional/ADL transfers   OT goals addressed during session: ADL's and self-care      AM-PAC OT "6 Clicks" Daily Activity     Outcome  Measure   Help from another person eating meals?: A Lot Help from another person taking care of personal grooming?: Total Help from another person toileting, which includes using toliet, bedpan, or urinal?: Total Help from another person bathing (including washing, rinsing, drying)?: A Lot Help from another person to put on and taking off regular upper body clothing?: Total Help from another person to put on and taking off regular lower body clothing?: Total 6 Click Score: 8    End of Session Equipment Utilized During Treatment: Gait belt;Rolling walker;Oxygen  OT Visit Diagnosis: Unsteadiness on feet (R26.81);Other abnormalities of gait and mobility (R26.89);Muscle weakness (generalized) (M62.81);Pain;Other symptoms and signs involving cognitive function Pain - part of body: (neck/back)   Activity Tolerance Other (comment);Patient limited by fatigue;Patient limited by pain(bowel incontinence)   Patient Left in bed;with call bell/phone within reach;with bed alarm set;with nursing/sitter in room   Nurse Communication Mobility status        Time: 1696-7893 OT Time Calculation (min): 32 min  Charges: OT General Charges $OT Visit: 1 Visit OT Treatments $Self Care/Home Management : 8-22 mins  Jolaine Artist, [place] Pager [phone] Office [place] 07/30/2019, 3:52 PM

## 2019-07-30 NOTE — Progress Notes (Signed)
Calorie Count Note  48 hour calorie count ordered.  Diet: Dysphagia 1 with Honey Thick Liquids Supplements: Magic Cup TID  Breakfast: refused Lunch: refused Dinner: refused Supplements: refused  Total intake: 0 kcal (0% of minimum estimated needs)  0 protein (0% of minimum estimated needs)  Nutrition Dx: Inadequate oral intake related to inability to eat as evidenced by NPO status.  Progressing, pt now on Dysphagia 1 diet with honey thick liquids    Goal: Patient will meet greater than or equal to 90% of their needs  Progressing.   Intervention:  -d/c Calorie count, pt not eating despite severe reduction in calories/protein provided from TF -Recommend proceeding with PEG placement -Return to continuous tube feeding, but change formula to Jevity 1.5 cal to provide fiber. -Via Cortrak, begin: Jevity 1.5 @ 60 ml/hr (1448ml per day) 30 ml Prostat daily 158ml free water Q6H (per MD/PA)  Provides: 2260 kcal, 106 grams protein, and 1094 ml free water(1673ml total free water with flushes).     Larkin Ina, MS, RD, LDN RD pager number and weekend/on-call pager number located in [place].

## 2019-07-30 NOTE — Progress Notes (Signed)
STROKE TEAM PROGRESS NOTE   INTERVAL HISTORY: Patient is sitting up in bed.  Remains dysarthric and has dysphagia.  He continues not to eat well.  Vital signs are stable.  No changes.  Neurological exam unchanged.  His wife is at his bedside.  After explanation to her that patient really has not been eating well and should get a PEG tube to enough enough nutrition so that he can improve with therapy needs she is agreeable to doing PEG tube under general anesthesia.  I spoke to trauma MD Dr. Bobbye Morton and this will be scheduled for tomorrow.  Vitals:   07/30/19 0343 07/30/19 0500 07/30/19 0700 07/30/19 1300  BP: 130/63  (!) 142/83 (!) 146/84  Pulse: 90  89 84  Resp: 18  18 18   Temp: 97.8 F (36.6 C)  98.8 F (37.1 C) 98.4 F (36.9 C)  TempSrc: Oral  Oral Oral  SpO2: 99%     Weight:  86.5 kg    Height:       CBC:  Recent Labs  Lab 07/26/19 0356 07/27/19 0420  WBC 8.1 9.9  HGB 9.5* 11.7*  HCT 30.0* 36.4*  MCV 96.5 95.5  PLT 437* 536*   Basic Metabolic Panel:  Recent Labs  Lab 07/26/19 0356 07/27/19 0420  NA 139 144  K 3.4* 4.2  CL 109 112*  CO2 24 21*  GLUCOSE 114* 118*  BUN 22 15  CREATININE 1.46* 1.29*  CALCIUM 7.9* 8.7*    IMAGING past 24 hours No results found.   PHYSICAL EXAM      General - frail elderly malnourished looking Caucasian, not in acute distress  Ophthalmologic - fundi not visualized due to noncooperation.  Cardiovascular - Regular rhythm and rate.   Neurological Exam - Patient is drowsy but can be aroused..  Follows simple commands and no aphasia. Able to name and repeat. Speech moderately dysarthric. Visual field full, no hemianopia. Gaze bilaterally without difficulty, PERRL. Tracking bilaterally. Left mild facial droop. Tongue midline. BUE 4/5. BLE 3/5 proximal and distal. DTR 1+ and no babinski. Sensation symmetrical and coordination intact b/l FTN. Gait not tested.    ASSESSMENT/PLAN Stephen Copeland is a 79 y.o. male with history of Afib,  systolic CHF, HLD, CAD, HTN, prostate cancer presenting with left sided weakness, facial droop, dysarthria.   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 embolic secondary to known AF even on Eliquis  Code Stroke CT head evolving R MCA territory infarct (insula, cerebral cortex). hyperdense R M2 +/- M3. ASPECTS 7    CTA head & neck +LVO. Probable ruptured plaque R ICA bifurcation w/ near occlusive stenosis, possible superimposed dissection. Distal reconstitution R M1 from collaterals but w/ distal embolic occlusion proximal R M2/3 superior division branch. L ICA bifurcation and siphon atherosclerosis. B VA origins 50% stenoses. R subclavian artery origin 60% stenosis.   CT perfusion 19cc core infarct R insula and posterior frontal. 33cc penumbra.   Cerebral angio TICI3 revascularization occluded middle trifurcation R MCA branch, revascularization R ICA occlusion w/ angioplasty and stenting.   Post IR CT contrast stain R putamen and moderate R sylvian fissure, SAH + contrast.    CT head R sylvian fissure SAH. Increased contrast vs hemorrhage R suprasellar cistern following ICA concerned for increased SAH. R sulci effacement.   Carotid Doppler  R  ICA stent patent  MRI  Patchy R MCA infarcts. SAH R sylvian fissure. Small IVH.   MRA - Severely motion degraded head MRA. Persistent patency of the revascularized right ICA. No flow limiting proximal stenosis identified.   CT head repeat 5/11 R insular infarct stable. Residual small SAH and small IVH.  CT head repeat 5/13 evolving R MCA infarct, SAH unchanged   2D Echo EF 35-40%. No source of embolus   LDL 90  HgbA1c 5.8  SCDs for VTE prophylaxis  Eliquis (apixaban) daily prior to admission, now on aspirin 81 mg daily and Brilinta (ticagrelor) 90 mg bid. AC so far on hold given continued SAH and  IVH on CT, and PEG placement. Consider to repeat CT next week after PEG and decide on [facility]  Therapy recommendations:  CIR - > pt w/o family support at home. Will need SNF  Disposition:  pending  Acute hypoxemic respiratory failure, resolved Likely Aspiration PNA, treated  Extubated w/ CCM signing off 5/8  Vomiting x 5 with possible aspiration requiring NRB 5/10. CCM reconsulted. Transferred to ICU and Intubated  Extubated 5/11 w/o difficulties  Unasyn 5/10>>5/17 for 7 days  Leukocytosis - resolved  CXR - slight improvement aeration LLL w/ atx/infiltrate. Cardiomegaly.   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 Hold AC given post IR SAH and need for DAPT post stent placement. . Bradycardia resolved off sedation . On metoprolol 12.5 bid w/ tachycardia past 24h - increase metoprolol to 25 bid  . Consider AC once SAH resolves and PEG placed, will consider to repeat CT next week after PEG.   Hypertension  Home meds:  Hydralazine 50 tid, metoprolol 25 bid  On Hydralazine 25 mg Q8 and metoprolol 25 bid . SBP 120s, on the low end  . Long-term BP goal normotensive  Hyperlipidemia  Home meds:  No statin  LDL 90, goal < 70  May consider statin again ->pt adamantly has refused statins in the past  AKI on CKD   Cre 1.29  Renal US R w/ increased echogenicity. No hydro  Urine NA neg  On IVF @ 75  Free water 150 Q6  Leukocytosis, resolved  Temp 100.5->afebrile  UA neg  CXR unremarkable  Continue monitoring  Chronic HFrEF Dilated Cardiomyopathy LBBB Prolonged QT  EF 30 to 40% since 2017  This admission EF 35 to 40%  CXR no pulmonary  edema  Dysphagia . Secondary to stroke . SLP difficulty d/t pt participation. . Trial D1 puree w/ honey thick liquids -> pt not swallowing and eat enough for daily needs -> Back to continuous feeding -> off TF . On free water 150 mL q6h . Encourage po intake . Pt then irritated by cortrak and persistent diarrhea -> hold off TF and on IVF . Discussed with wife in length, and she now is agreeable for PEG, pt is agreeable with PEG -> surgery consulted -> anticipate PEG placement Monday . Speech and dietician on board . Diarrhea stopped with TF on hold - continue imodium scheduled  Other Stroke Risk Factors  Advanced age  Coronary artery disease  Chronic systolic Congestive heart failure  Other  Active Problems  GERD on PPI   BPH on flomax PTA  Prostate cancer   Hypokalemia - resolved 3.4->4.0->3.4 - supplement and recheck  LBBB. Appears old. EKG neg ischemia. Trops 27->32  (Likely strain related)  Small blood clots in loose stool with recurrence. Hgb 11.7. Continue to monitor.  No indication for colonoscopy  Loose stool on imodium scheduled  Thrombocytosis - [facility] Day #21   Patient encouraged to eat but has not been eating a lot.  Continue tube feeds at night.  I had a long discussion with the patient's wife at the bedside regarding the frustrating situation where patient is cleared for dysphagia 1 diet but clearly has been refusing to eat and his oral intake has been so poor that he needs supplementary nutrition with the PEG tube.  Patient's wife earlier had refused consent for PEG tube under general anesthesia but now she is willing and I spoken to Dr. Bobbye Morton from trauma team and this will be scheduled for tomorrow.  I also discussed the alternative option of palliative care with the patient wife wants to continue full support at the present time.  Discussed with Dr. Bobbye Morton.  Greater than 50% time during this 25-minute visit was spent in counseling and coordination of  care and discussion with care team and answering questions Antony Contras, MD  To contact Stroke Continuity provider, please refer to [email or web address] After hours, contact General Neurology

## 2019-07-30 NOTE — Progress Notes (Signed)
  Speech Language Pathology Treatment: Dysphagia  Patient Details Name: Stephen Copeland MRN: [id number] DOB: 1940/03/08 Today's Date: 07/30/2019 Time: 7972-8206 SLP Time Calculation (min) (ACUTE ONLY): 15 min  Assessment / Plan / Recommendation Clinical Impression  Pt was seen for skilled ST intervention targeting goals for safe swallow, intelligibility, and attention. Pt was seen at bedside during lunch (puree/honey thick liquids). Pt accepted minimal boluses, but did not exhibit overt s/s aspiration. Pt exhibits highly variable intelligibility of speech. SLP reviewed strategies for increasing intelligibility, including decreased rate and overarticulation. Pt demonstrated minimal application of strategies with limited carryover. SLP will continue to follow to assess diet tolerance and readiness to try advanced textures. Safe swallow precautions posted at [facility].     HPI HPI: 79 y/o M admitted 5/6 with right sided CVA and remained intubated post thrombectomy and carotid stent. He was outside the TPA window. Intubated 5/6-5/7, then on 5/10 pt had multiple episodes of vomiting brown gastric contents and was reintubted. Pt was seen by SLP in the interim, found to have acute neurogenic dysphagia with signs of aspiration with minimal PO trials. Has Cortrak, on D1/HTL diet. Could not place PEG tube 5/25, due to heart rate issues.      SLP Plan  Continue with current plan of care       Recommendations  Diet recommendations: Dysphagia 1 (puree);Honey-thick liquid Liquids provided via: Teaspoon Medication Administration: Via alternative means Supervision: Full supervision/cueing for compensatory strategies;Trained caregiver to feed patient Compensations: Slow rate;Small sips/bites;Minimize environmental distractions Postural Changes and/or Swallow Maneuvers: Seated upright 90 degrees                Oral Care Recommendations: Oral care QID Follow up Recommendations: Skilled Nursing facility SLP  Visit Diagnosis: Cognitive communication deficit (O15.615) Plan: Continue with current plan of care       GO              Shanaiya Bene B. [name], [facility], [place] Speech Language Pathologist Office: [phone] Pager: [phone]  Shonna Chock 07/30/2019, 2:50 PM

## 2019-07-30 NOTE — TOC CAGE-AID Note (Signed)
Transition of Care [facility]) - CAGE-AID Screening   Patient Details  Name: Stephen Copeland MRN: [id number] Date of Birth: 20-Oct-1940  Transition of Care [facility]) CM/SW Contact:    Emeterio Reeve, [place] Phone Number: 07/30/2019, 2:37 PM   Clinical Narrative: Pt denied alcohol use and substance use.    CAGE-AID Screening:    Have You Ever Felt You Ought to Cut Down on Your Drinking or Drug Use?: No Have People Annoyed You By Critizing Your Drinking Or Drug Use?: No Have You Felt Bad Or Guilty About Your Drinking Or Drug Use?: No Have You Ever Had a Drink or Used Drugs First Thing In The Morning to STeady Your Nerves or to Get Rid of a Hangover?: No CAGE-AID Score: 0  Substance Abuse Education Offered: No

## 2019-07-30 NOTE — Progress Notes (Signed)
Physical Therapy Treatment Patient Details Name: Stephen Copeland MRN: [id number] DOB: Jul 08, 1940 Today's Date: 07/30/2019    History of Present Illness 79 y.o. male admitted on 07/09/19 for L sided weakness, facial droop, and slurred speech.  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  He did not get any tPA as he was outside of the window.  Pt intubated 5/6 for procedure and extubated in the AM of 07/10/19.  Pt with other significant PMH of sinus brady, scoliosis, prostate CA s/p surgery, HTN, dilated cardiomyopathy, CAD, CHF.  Patient re-intubated 5/10 with inability to clear secretions.. Extubated 5/11.     PT Comments    Patient seen for mobility progression. Pt requires encouragement to participate due to c/o neck/back pain and impaired cognition. Pt requires max-total A +2 for bed mobility and functional transfer training this session. Incontinent of bowel with second sit to stand trial and total A required for hygiene. Continue to progress as tolerated with anticipated d/c to SNF for further skilled PT services.      Follow Up Recommendations  SNF     Equipment Recommendations  Rolling walker with 5" wheels;Wheelchair (measurements PT);Wheelchair cushion (measurements PT);3in1 (PT)    Recommendations for Other Services Rehab consult     Precautions / Restrictions Precautions Precautions: Fall Precaution Comments: coretrack Restrictions Weight Bearing Restrictions: No    Mobility  Bed Mobility Overal bed mobility: Needs Assistance Bed Mobility: Supine to Sit;Sit to Sidelying;Rolling Rolling: Max assist;+2 for physical assistance;+2 for safety/equipment   Supine to sit: Total assist;+2 for physical assistance;+2 for safety/equipment   Sit to sidelying: Max assist;+2 for physical assistance;+2 for safety/equipment General bed mobility comments: pt requires max encouragement to transition to EOB using modified helicopter transition to ease and  manage pain in back/neck; returned to sidelying with support of trunk and LEs   Transfers Overall transfer level: Needs assistance Equipment used: Rolling walker (2 wheeled) Transfers: Sit to/from Stand Sit to Stand: Max assist;+2 physical assistance;+2 safety/equipment         General transfer comment: max assist +2 to power up and steady into standing x 2, unable to acheive full stand   Ambulation/Gait                 Stairs             Wheelchair Mobility    Modified Rankin (Stroke Patients Only)       Balance Overall balance assessment: Needs assistance Sitting-balance support: Feet supported Sitting balance-Leahy Scale: Fair Sitting balance - Comments: min-mod assist initally, fading to min guard statically at EOB    Standing balance support: Bilateral upper extremity supported;During functional activity Standing balance-Leahy Scale: Poor Standing balance comment: relies on external and BUE support                             Cognition Arousal/Alertness: Awake/alert Behavior During Therapy: Flat affect;Restless Overall Cognitive Status: Impaired/Different from baseline Area of Impairment: Attention;Memory;Following commands;Safety/judgement;Problem solving;Awareness                   Current Attention Level: Focused Memory: Decreased short-term memory Following Commands: Follows one step commands inconsistently;Follows one step commands with increased time Safety/Judgement: Decreased awareness of safety;Decreased awareness of deficits Awareness: Intellectual Problem Solving: Decreased initiation;Difficulty sequencing;Requires verbal cues;Requires tactile cues;Slow processing General Comments: pt with bowel incontience during session, requires max encouragement to participate with poor awareness of deficits, safety and multimodal  cueing for problem solving       Exercises      General Comments General comments (skin integrity,  edema, etc.): NT in room upon exit assisting with bathing after incontient episode       Pertinent Vitals/Pain Pain Assessment: Faces Faces Pain Scale: Hurts little more Pain Location: neck and back  Pain Descriptors / Indicators: Grimacing;Guarding;Moaning Pain Intervention(s): Limited activity within patient's tolerance;Monitored during session;Repositioned    Home Living                      Prior Function            PT Goals (current goals can now be found in the care plan section) Acute Rehab PT Goals Patient Stated Goal: less pain  Progress towards PT goals: Progressing toward goals    Frequency    Min 3X/week      PT Plan Current plan remains appropriate    Co-evaluation PT/OT/SLP Co-Evaluation/Treatment: Yes Reason for Co-Treatment: Necessary to address cognition/behavior during functional activity;For patient/therapist safety;To address functional/ADL transfers PT goals addressed during session: Mobility/safety with mobility OT goals addressed during session: ADL's and self-care      AM-PAC PT "6 Clicks" Mobility   Outcome Measure  Help needed turning from your back to your side while in a flat bed without using bedrails?: A Lot Help needed moving from lying on your back to sitting on the side of a flat bed without using bedrails?: A Lot Help needed moving to and from a bed to a chair (including a wheelchair)?: A Lot Help needed standing up from a chair using your arms (e.g., wheelchair or bedside chair)?: A Lot Help needed to walk in hospital room?: Total Help needed climbing 3-5 steps with a railing? : Total 6 Click Score: 10    End of Session Equipment Utilized During Treatment: Gait belt Activity Tolerance: Patient limited by pain;Other (comment)(bowel incontinence) Patient left: in bed;with call bell/phone within reach;Other (comment)(NT present and cleaning pt up from [facility]) Nurse Communication: Mobility status PT Visit Diagnosis: Muscle  weakness (generalized) (M62.81);Other abnormalities of gait and mobility (R26.89);Other symptoms and signs involving the nervous system (R29.898);Hemiplegia and hemiparesis Hemiplegia - Right/Left: Left Hemiplegia - dominant/non-dominant: Non-dominant Hemiplegia - caused by: Cerebral infarction     Time: 3845-3646 PT Time Calculation (min) (ACUTE ONLY): 33 min  Charges:  $Gait Training: 8-22 mins                     Earney Navy, PTA Acute Rehabilitation Services Pager: [phone] Office: [phone]     Darliss Cheney 07/30/2019, 4:34 PM

## 2019-07-31 LAB — GLUCOSE, CAPILLARY
Glucose-Capillary: 100 mg/dL — ABNORMAL HIGH (ref 70–99)
Glucose-Capillary: 109 mg/dL — ABNORMAL HIGH (ref 70–99)
Glucose-Capillary: 112 mg/dL — ABNORMAL HIGH (ref 70–99)
Glucose-Capillary: 116 mg/dL — ABNORMAL HIGH (ref 70–99)
Glucose-Capillary: 126 mg/dL — ABNORMAL HIGH (ref 70–99)
Glucose-Capillary: 139 mg/dL — ABNORMAL HIGH (ref 70–99)

## 2019-07-31 LAB — BASIC METABOLIC PANEL
Anion gap: 7 (ref 5–15)
BUN: 19 mg/dL (ref 8–23)
CO2: 23 mmol/L (ref 22–32)
Calcium: 8 mg/dL — ABNORMAL LOW (ref 8.9–10.3)
Chloride: 108 mmol/L (ref 98–111)
Creatinine, Ser: 1.26 mg/dL — ABNORMAL HIGH (ref 0.61–1.24)
GFR calc Af Amer: >60 mL/min (ref >60)
GFR calc non Af Amer: 54 mL/min — ABNORMAL LOW (ref >60)
Glucose, Bld: 150 mg/dL — ABNORMAL HIGH (ref 70–99)
Potassium: 3.1 mmol/L — ABNORMAL LOW (ref 3.5–5.1)
Sodium: 138 mmol/L (ref 135–145)

## 2019-07-31 LAB — CBC
HCT: 29.8 % — ABNORMAL LOW (ref 39.0–52.0)
Hemoglobin: 9.6 g/dL — ABNORMAL LOW (ref 13.0–17.0)
MCH: 30.1 pg (ref 26.0–34.0)
MCHC: 32.2 g/dL (ref 30.0–36.0)
MCV: 93.4 fL (ref 80.0–100.0)
Platelets: 361 10*3/uL (ref 150–400)
RBC: 3.19 MIL/uL — ABNORMAL LOW (ref 4.22–5.81)
RDW: 13.6 % (ref 11.5–15.5)
WBC: 7.2 10*3/uL (ref 4.0–10.5)
nRBC: 0 % (ref 0.0–0.2)

## 2019-07-31 LAB — MAGNESIUM: Magnesium: 1.4 mg/dL — ABNORMAL LOW (ref 1.7–2.4)

## 2019-07-31 MED ORDER — MAGNESIUM SULFATE 4 GM/100ML IV SOLN
4.0000 g | Freq: Once | INTRAVENOUS | Status: AC
  Administered 2019-07-31: 4 g via INTRAVENOUS
  Filled 2019-07-31: qty 100

## 2019-07-31 MED ORDER — SODIUM CHLORIDE 0.9 % IV SOLN: INTRAVENOUS | Status: DC

## 2019-07-31 MED ORDER — JEVITY 1.5 CAL/FIBER PO LIQD: 1000.0000 mL | ORAL | Status: DC

## 2019-07-31 MED ORDER — POTASSIUM CHLORIDE 20 MEQ PO PACK
40.0000 meq | PACK | ORAL | Status: AC
  Administered 2019-07-31 (×3): 40 meq via ORAL
  Filled 2019-07-31 (×3): qty 2

## 2019-07-31 NOTE — Progress Notes (Signed)
Tele notified RN about pt HR reaching 151 but HR back down into the 90s. MD Leonie Man notified.

## 2019-07-31 NOTE — TOC Progression Note (Signed)
Transition of Care [facility]) - Progression Note    Patient Details  Name: Stephen Copeland MRN: [id number] Date of Birth: 25-Feb-1941  Transition of Care [facility]) CM/SW [place], [place] Work Phone Number: 07/31/2019, 1:06 PM  Clinical Narrative:    MSW Intern received call that [facility] is no longer to take pt, no reason given. Pt agreeable to [facility] as alternative. Wife contacted and agreeable as well. Will update [place].   Expected Discharge Plan: [place] Barriers to Discharge: Continued Medical Work up  Expected Discharge Plan and Services Expected Discharge Plan: [place] arrangements for the past 2 months: Single Family Home                                       Social Determinants of Health (SDOH) Interventions    Readmission Risk Interventions No flowsheet data found.

## 2019-07-31 NOTE — Progress Notes (Signed)
STROKE TEAM PROGRESS NOTE   INTERVAL HISTORY: Patient is sitting up in bed.  Remains dysarthric and has dysphagia.  He continues not to eat well.  Vital signs are stable.  He had a brief run of tachycardia with heart rate in the 150s possibly V. tach but has now returned to heart rate in the 90s.  He was asymptomatic during this episode.  Neurological exam unchanged.   I spoke to trauma MD Dr. Bobbye Morton and PEG tube is scheduled now for Tuesday as there is no opening in endoscopy suite to do the procedure today. Vitals:   07/31/19 0004 07/31/19 0427 07/31/19 0835 07/31/19 1146  BP: 124/65 128/80 (!) 159/95 131/67  Pulse: 69 70 91 64  Resp: 18 20 20 20   Temp: 97.7 F (36.5 C) 98.6 F (37 C) 97.7 F (36.5 C) 98 F (36.7 C)  TempSrc: Oral Oral Oral Oral  SpO2: 100% 98% 98% 99%  Weight:  83.7 kg    Height:       CBC:  Recent Labs  Lab 07/27/19 0420 07/31/19 1159  WBC 9.9 7.2  HGB 11.7* 9.6*  HCT 36.4* 29.8*  MCV 95.5 93.4  PLT 509* 527   Basic Metabolic Panel:  Recent Labs  Lab 07/27/19 0420 07/31/19 1159  NA 144 138  K 4.2 3.1*  CL 112* 108  CO2 21* 23  GLUCOSE 118* 150*  BUN 15 19  CREATININE 1.29* 1.26*  CALCIUM 8.7* 8.0*  MG  --  1.4*    IMAGING past 24 hours No results found.   PHYSICAL EXAM      General - frail elderly malnourished looking Caucasian, not in acute distress  Ophthalmologic - fundi not visualized due to noncooperation.  Cardiovascular - Regular rhythm and rate.   Neurological Exam - Patient is drowsy but can be aroused..  Follows simple commands and no aphasia. Able to name and repeat. Speech moderately dysarthric. Visual field full, no hemianopia. Gaze bilaterally without difficulty, PERRL. Tracking bilaterally. Left mild facial droop. Tongue midline. BUE 4/5. BLE 3/5 proximal and distal. DTR 1+ and no babinski. Sensation symmetrical and coordination intact b/l FTN. Gait not tested.    ASSESSMENT/PLAN Stephen Copeland is a 79 y.o. male with  history of Afib, systolic CHF, HLD, CAD, HTN, prostate cancer presenting with left sided weakness, facial droop, dysarthria.   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 embolic secondary to known AF even on Eliquis  Code Stroke CT head evolving R MCA territory infarct (insula, cerebral cortex). hyperdense R M2 +/- M3. ASPECTS 7    CTA head & neck +LVO. Probable ruptured plaque R ICA bifurcation w/ near occlusive stenosis, possible superimposed dissection. Distal reconstitution R M1 from collaterals but w/ distal embolic occlusion proximal R M2/3 superior division branch. L ICA bifurcation and siphon atherosclerosis. B VA origins 50% stenoses. R subclavian artery origin 60% stenosis.   CT perfusion 19cc core infarct R insula and posterior frontal. 33cc penumbra.   Cerebral angio TICI3 revascularization occluded middle trifurcation R MCA branch, revascularization R ICA occlusion w/ angioplasty and stenting.   Post IR CT contrast stain R putamen and moderate R sylvian fissure, SAH + contrast.    CT head R sylvian fissure SAH. Increased contrast vs hemorrhage R suprasellar cistern following ICA concerned for increased SAH. R sulci effacement.  Carotid Doppler  R ICA stent patent  MRI  Patchy R MCA infarcts. SAH R sylvian fissure. Small IVH.   MRA - Severely motion degraded head MRA. Persistent patency of the revascularized right ICA. No flow limiting proximal stenosis identified.   CT head repeat 5/11 R insular infarct stable. Residual small SAH and small IVH.  CT head repeat 5/13 evolving R MCA infarct, SAH unchanged   2D Echo EF 35-40%. No source of embolus   LDL 90  HgbA1c 5.8  SCDs for VTE prophylaxis  Eliquis (apixaban) daily prior to admission, now on aspirin 81 mg daily and Brilinta (ticagrelor) 90 mg bid. AC so far on hold given  continued SAH and IVH on CT, and PEG placement. Consider to repeat CT next week after PEG and decide on [facility]  Therapy recommendations:  CIR - > pt w/o family support at home. Will need SNF  Disposition:  pending  Acute hypoxemic respiratory failure, resolved Likely Aspiration PNA, treated  Extubated w/ CCM signing off 5/8  Vomiting x 5 with possible aspiration requiring NRB 5/10. CCM reconsulted. Transferred to ICU and Intubated  Extubated 5/11 w/o difficulties  Unasyn 5/10>>5/17 for 7 days  Leukocytosis - resolved  CXR - slight improvement aeration LLL w/ atx/infiltrate. Cardiomegaly.   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 Hold AC given post IR SAH and need for DAPT post stent placement. . Bradycardia resolved off sedation . On metoprolol 12.5 bid w/ tachycardia past 24h - increase metoprolol to 25 bid  . Consider AC once SAH resolves and PEG placed, will consider to repeat CT next week after PEG.   Hypertension  Home meds:  Hydralazine 50 tid, metoprolol 25 bid  On Hydralazine 25 mg Q8 and metoprolol 25 bid . SBP 120s, on the low end  . Long-term BP goal normotensive  Hyperlipidemia  Home meds:  No statin  LDL 90, goal < 70  May consider statin again ->pt adamantly has refused statins in the past  AKI on CKD   Cre 1.29  Renal US R w/ increased echogenicity. No hydro  Urine NA neg  On IVF @ 75  Free water 150 Q6  Leukocytosis, resolved  Temp 100.5->afebrile  UA neg  CXR unremarkable  Continue monitoring  Chronic HFrEF Dilated Cardiomyopathy LBBB Prolonged QT  EF 30 to 40% since 2017  This admission EF 35 to 40%  CXR  no pulmonary edema  Dysphagia . Secondary to stroke . SLP difficulty d/t pt participation. . Trial D1 puree w/ honey thick liquids -> pt not swallowing and eat enough for daily needs -> Back to continuous feeding -> off TF . On free water 150 mL q6h . Encourage po intake . Pt then irritated by cortrak and persistent diarrhea -> hold off TF and on IVF . Discussed with wife in length, and she now is agreeable for PEG, pt is agreeable with PEG -> surgery consulted -> anticipate PEG placement Monday . Speech and dietician on board . Diarrhea stopped with TF on hold - continue imodium scheduled  Other Stroke Risk Factors  Advanced age  Coronary artery disease  Chronic systolic Congestive  heart failure  Other Active Problems  GERD on PPI   BPH on flomax PTA  Prostate cancer   Hypokalemia - resolved 3.4->4.0->3.4 - supplement and recheck  LBBB. Appears old. EKG neg ischemia. Trops 27->32  (Likely strain related)  Small blood clots in loose stool with recurrence. Hgb 11.7. Continue to monitor.  No indication for colonoscopy  Loose stool on imodium scheduled  Thrombocytosis - [facility] Day #22   Patient encouraged to eat but has not been eating a lot.  Continue tube feeds at night.  Replace potassium and magnesium and recheck tomorrow.   I spoken to Dr. Bobbye Morton from trauma team and his PEG will be rescheduled for next Tuesday due to lack of available openings to do this today in endoscopy suite.   Greater than 50% time during this 25-minute visit was spent in counseling and coordination of care and discussion with care team and answering questions Antony Contras, MD  To contact Stroke Continuity provider, please refer to [email or web address] After hours, contact General Neurology

## 2019-07-31 NOTE — Progress Notes (Signed)
   07/31/19 0000  Provider Notification  Provider Name/Title Dr Leonel Ramsay  Date Provider Notified 07/31/19  Time Provider Notified 0000  Notification Type Page  Notification Reason Other (Comment) (Pt said to be going for PEG placement in the morning )  Response See new orders  Date of Provider Response 07/31/19  Time of Provider Response 0002

## 2019-07-31 NOTE — Telephone Encounter (Signed)
Pt still in the hospital and not taking Eliquis as he is now taking Aspirin and Brillanta on the admission.

## 2019-08-01 LAB — BASIC METABOLIC PANEL
Anion gap: 7 (ref 5–15)
BUN: 21 mg/dL (ref 8–23)
CO2: 23 mmol/L (ref 22–32)
Calcium: 8.1 mg/dL — ABNORMAL LOW (ref 8.9–10.3)
Chloride: 107 mmol/L (ref 98–111)
Creatinine, Ser: 1.24 mg/dL (ref 0.61–1.24)
GFR calc Af Amer: >60 mL/min (ref >60)
GFR calc non Af Amer: 55 mL/min — ABNORMAL LOW (ref >60)
Glucose, Bld: 121 mg/dL — ABNORMAL HIGH (ref 70–99)
Potassium: 5 mmol/L (ref 3.5–5.1)
Sodium: 137 mmol/L (ref 135–145)

## 2019-08-01 LAB — CBC
HCT: 30.1 % — ABNORMAL LOW (ref 39.0–52.0)
Hemoglobin: 9.7 g/dL — ABNORMAL LOW (ref 13.0–17.0)
MCH: 30.1 pg (ref 26.0–34.0)
MCHC: 32.2 g/dL (ref 30.0–36.0)
MCV: 93.5 fL (ref 80.0–100.0)
Platelets: 374 10*3/uL (ref 150–400)
RBC: 3.22 MIL/uL — ABNORMAL LOW (ref 4.22–5.81)
RDW: 13.8 % (ref 11.5–15.5)
WBC: 8.1 10*3/uL (ref 4.0–10.5)
nRBC: 0 % (ref 0.0–0.2)

## 2019-08-01 LAB — GLUCOSE, CAPILLARY
Glucose-Capillary: 113 mg/dL — ABNORMAL HIGH (ref 70–99)
Glucose-Capillary: 116 mg/dL — ABNORMAL HIGH (ref 70–99)
Glucose-Capillary: 128 mg/dL — ABNORMAL HIGH (ref 70–99)
Glucose-Capillary: 134 mg/dL — ABNORMAL HIGH (ref 70–99)

## 2019-08-01 NOTE — Plan of Care (Signed)
Discussed plan of care for the evening with the wife and answered all questions at this time.  She talked to her husband for over an hour.

## 2019-08-01 NOTE — Plan of Care (Signed)
Discussed with patient plan of care for the evening, pain management and resting with some teach back displayed

## 2019-08-01 NOTE — Progress Notes (Signed)
STROKE TEAM PROGRESS NOTE   INTERVAL HISTORY: No changes overnight, nurses at his bedside  Vitals:   08/01/19 0400 08/01/19 0500 08/01/19 0600 08/01/19 0730  BP:    (!) 159/73  Pulse:    (!) 105  Resp: 19 15 15 18   Temp:    98.5 F (36.9 C)  TempSrc:    Oral  SpO2:    98%  Weight:      Height:       CBC:  Recent Labs  Lab 07/31/19 1159 08/01/19 0320  WBC 7.2 8.1  HGB 9.6* 9.7*  HCT 29.8* 30.1*  MCV 93.4 93.5  PLT 361 096   Basic Metabolic Panel:  Recent Labs  Lab 07/31/19 1159 08/01/19 0320  NA 138 137  K 3.1* 5.0  CL 108 107  CO2 23 23  GLUCOSE 150* 121*  BUN 19 21  CREATININE 1.26* 1.24  CALCIUM 8.0* 8.1*  MG 1.4*  --     IMAGING past 24 hours No results found.   PHYSICAL EXAM      Neurologic exam: Frail elderly thin white male, no acute distress, regular rate and rhythm, he is alert and awake, oriented x3, he follows simple commands without aphasia but with dysarthria, can name and repeat, blinks to threat bilaterally, extraocular muscles intact, pupils appear equally round and reactive to light, left lower facial weakness however he reports sensation is intact in his face, tongue and uvula midline, sensation intact, not ataxic or dysmetric. Sensation is intact to light touch in all extremities, poor effort on strength exam can lift all extremities antigravity without drift right side appears 5 out of 5 the left with mild hemiparesis.   ASSESSMENT/PLAN [name] is a 79 y.o. male with history of Afib, systolic CHF, HLD, CAD, HTN, prostate cancer presenting with left sided weakness, facial droop, dysarthria.   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 embolic secondary to known AF even on Eliquis  Code Stroke CT head evolving R MCA territory infarct (insula, cerebral cortex). hyperdense R M2 +/- M3.  ASPECTS 7    CTA head & neck +LVO. Probable ruptured plaque R ICA bifurcation w/ near occlusive stenosis, possible superimposed dissection. Distal reconstitution R M1 from collaterals but w/ distal embolic occlusion proximal R M2/3 superior division branch. L ICA bifurcation and siphon atherosclerosis. B VA origins 50% stenoses. R subclavian artery origin 60% stenosis.   CT perfusion 19cc core infarct R insula and posterior frontal. 33cc penumbra.   Cerebral angio TICI3 revascularization occluded middle trifurcation R MCA branch, revascularization R ICA occlusion w/ angioplasty and stenting.   Post IR CT contrast stain R putamen and moderate R sylvian fissure, SAH + contrast.    CT head R sylvian fissure SAH. Increased contrast vs hemorrhage R suprasellar cistern following ICA concerned for increased SAH. R sulci effacement.   Carotid Doppler  R ICA stent patent  MRI  Patchy R MCA infarcts. SAH R sylvian fissure. Small IVH.   MRA - Severely motion degraded head MRA. Persistent patency of the revascularized right ICA. No flow limiting proximal stenosis identified.   CT head repeat 5/11 R insular infarct stable. Residual small SAH and small IVH.  CT head repeat 5/13 evolving R MCA infarct, SAH unchanged   2D Echo EF 35-40%. No source of embolus   LDL  90  HgbA1c 5.8  SCDs for VTE prophylaxis  Eliquis (apixaban) daily prior to admission, now on aspirin 81 mg daily and Brilinta (ticagrelor) 90 mg bid. AC so far on hold given continued SAH and IVH on CT, and PEG placement. Consider to repeat CT next week after PEG and decide on [facility]  Therapy recommendations:  CIR - > pt w/o family support at home. Will need SNF  Disposition:  pending  Acute hypoxemic respiratory failure, resolved Likely Aspiration PNA, treated  Extubated w/ CCM signing off 5/8  Vomiting x 5 with possible aspiration requiring NRB 5/10. CCM reconsulted. Transferred to ICU and Intubated  Extubated 5/11 w/o  difficulties  Unasyn 5/10>>5/17 for 7 days  Leukocytosis - resolved  CXR - slight improvement aeration LLL w/ atx/infiltrate. Cardiomegaly.   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 Hold AC given post IR SAH and need for DAPT post stent placement. . Bradycardia resolved off sedation . On metoprolol 12.5 bid w/ tachycardia past 24h - increase metoprolol to 25 bid  . Consider AC once SAH resolves and PEG placed, will consider to repeat CT next week after PEG.   Hypertension  Home meds:  Hydralazine 50 tid, metoprolol 25 bid  On Hydralazine 25 mg Q8 and metoprolol 25 bid . SBP 120s, on the low end  . Long-term BP goal normotensive  Hyperlipidemia  Home meds:  No statin  LDL 90, goal < 70  May consider statin again ->pt adamantly has refused statins in the past  AKI on CKD   Cre 1.29->1.26->1.24  Renal US R w/ increased echogenicity. 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 Secondary to stroke . SLP difficulty d/t pt participation. . Trial D1 puree w/ honey thick liquids -> pt not swallowing and eat enough for daily needs -> Back to continuous feeding -> off TF . On free water 150 mL q6h . Encourage po intake . Pt then irritated by cortrak and persistent diarrhea -> hold off TF and on IVF . Discussed with wife in length, and she is agreeable for PEG, pt is agreeable  with PEG -> surgery consulted -> anticipate PEG placement . Speech and dietician on board . Diarrhea stopped with TF on hold - continue imodium scheduled  Other Stroke Risk Factors  Advanced age  Coronary artery disease  Chronic systolic Congestive heart failure  Other Active Problems  GERD on PPI   BPH on flomax PTA  Prostate cancer   Hypokalemia - resolved 3.4->4.0->3.4-3.1->5.0   LBBB. Appears old. EKG neg ischemia. Trops 27->32  (Likely strain related)  Small blood clots in loose stool with recurrence. (No indication for colonoscopy) Hgb 11.7->9.6->9.7 Continue to monitor.    Loose stool on imodium scheduled  Thrombocytosis - 509->361(resolved)->374   PLAN  PEG planned   Repeat Head CT after PEG  Consider anticoagulation after PEG and CT  Monitor labs   Hospital Day #23   Personally examined patient and images, and have participated in and made any corrections needed to history, physical, neuro exam,assessment and plan as stated above.  I have personally obtained the history, evaluated lab date, reviewed imaging studies and agree with radiology interpretations.    Sarina Ill, MD Stroke Neurology   A total of 25 minutes was spent for the care of this patient, spent on counseling patient and family on different diagnostic and therapeutic options, counseling and coordination of care, riskd ans benefits of management, compliance, or risk factor reduction and education.    To contact Stroke Continuity provider, please refer to [email or web address] After hours, contact General Neurology

## 2019-08-01 NOTE — Plan of Care (Signed)
Discussed with patient plan of care for the evening, pain management and him wanting to call his wife tonight with some teach back displayed

## 2019-08-02 ENCOUNTER — Inpatient Hospital Stay (HOSPITAL_COMMUNITY): Payer: Medicare Other

## 2019-08-02 LAB — GLUCOSE, CAPILLARY
Glucose-Capillary: 107 mg/dL — ABNORMAL HIGH (ref 70–99)
Glucose-Capillary: 110 mg/dL — ABNORMAL HIGH (ref 70–99)
Glucose-Capillary: 110 mg/dL — ABNORMAL HIGH (ref 70–99)
Glucose-Capillary: 141 mg/dL — ABNORMAL HIGH (ref 70–99)
Glucose-Capillary: 147 mg/dL — ABNORMAL HIGH (ref 70–99)
Glucose-Capillary: 98 mg/dL (ref 70–99)

## 2019-08-02 LAB — COMPREHENSIVE METABOLIC PANEL
ALT: 83 U/L — ABNORMAL HIGH (ref 0–44)
ALT: 97 U/L — ABNORMAL HIGH (ref 0–44)
AST: 78 U/L — ABNORMAL HIGH (ref 15–41)
AST: 106 U/L — ABNORMAL HIGH (ref 15–41)
Albumin: 1.7 g/dL — ABNORMAL LOW (ref 3.5–5.0)
Albumin: 2 g/dL — ABNORMAL LOW (ref 3.5–5.0)
Alkaline Phosphatase: 83 U/L (ref 38–126)
Alkaline Phosphatase: 91 U/L (ref 38–126)
Anion gap: 5 (ref 5–15)
Anion gap: 8 (ref 5–15)
BUN: 21 mg/dL (ref 8–23)
BUN: 24 mg/dL — ABNORMAL HIGH (ref 8–23)
CO2: 25 mmol/L (ref 22–32)
CO2: 22 mmol/L (ref 22–32)
Calcium: 8.2 mg/dL — ABNORMAL LOW (ref 8.9–10.3)
Calcium: 8.5 mg/dL — ABNORMAL LOW (ref 8.9–10.3)
Chloride: 107 mmol/L (ref 98–111)
Chloride: 107 mmol/L (ref 98–111)
Creatinine, Ser: 1.29 mg/dL — ABNORMAL HIGH (ref 0.61–1.24)
Creatinine, Ser: 1.06 mg/dL (ref 0.61–1.24)
GFR calc Af Amer: >60 mL/min (ref >60)
GFR calc Af Amer: >60 mL/min (ref >60)
GFR calc non Af Amer: 53 mL/min — ABNORMAL LOW (ref >60)
GFR calc non Af Amer: >60 mL/min (ref >60)
Glucose, Bld: 146 mg/dL — ABNORMAL HIGH (ref 70–99)
Glucose, Bld: 104 mg/dL — ABNORMAL HIGH (ref 70–99)
Potassium: 4 mmol/L (ref 3.5–5.1)
Potassium: 5.8 mmol/L — ABNORMAL HIGH (ref 3.5–5.1)
Sodium: 137 mmol/L (ref 135–145)
Sodium: 137 mmol/L (ref 135–145)
Total Bilirubin: 1 mg/dL (ref 0.3–1.2)
Total Bilirubin: 2 mg/dL — ABNORMAL HIGH (ref 0.3–1.2)
Total Protein: 5 g/dL — ABNORMAL LOW (ref 6.5–8.1)
Total Protein: 5.8 g/dL — ABNORMAL LOW (ref 6.5–8.1)

## 2019-08-02 LAB — CBC
HCT: 30.1 % — ABNORMAL LOW (ref 39.0–52.0)
HCT: 37.8 % — ABNORMAL LOW (ref 39.0–52.0)
Hemoglobin: 9.6 g/dL — ABNORMAL LOW (ref 13.0–17.0)
Hemoglobin: 12 g/dL — ABNORMAL LOW (ref 13.0–17.0)
MCH: 30.2 pg (ref 26.0–34.0)
MCH: 30.2 pg (ref 26.0–34.0)
MCHC: 31.9 g/dL (ref 30.0–36.0)
MCHC: 31.7 g/dL (ref 30.0–36.0)
MCV: 94.7 fL (ref 80.0–100.0)
MCV: 95.2 fL (ref 80.0–100.0)
Platelets: 328 10*3/uL (ref 150–400)
Platelets: 268 10*3/uL (ref 150–400)
RBC: 3.18 MIL/uL — ABNORMAL LOW (ref 4.22–5.81)
RBC: 3.97 MIL/uL — ABNORMAL LOW (ref 4.22–5.81)
RDW: 13.9 % (ref 11.5–15.5)
RDW: 14 % (ref 11.5–15.5)
WBC: 10 10*3/uL (ref 4.0–10.5)
WBC: 12.1 10*3/uL — ABNORMAL HIGH (ref 4.0–10.5)
nRBC: 0 % (ref 0.0–0.2)
nRBC: 0 % (ref 0.0–0.2)

## 2019-08-02 LAB — AMYLASE: Amylase: 38 U/L (ref 28–100)

## 2019-08-02 LAB — LIPASE, BLOOD: Lipase: 39 U/L (ref 11–51)

## 2019-08-02 MED ORDER — CEFAZOLIN SODIUM-DEXTROSE 2-4 GM/100ML-% IV SOLN
2.0000 g | INTRAVENOUS | Status: AC
  Administered 2019-08-04: 2 g via INTRAVENOUS

## 2019-08-02 MED ORDER — SODIUM CHLORIDE 0.9 % IV SOLN: INTRAVENOUS | Status: DC

## 2019-08-02 MED ORDER — CEFAZOLIN SODIUM-DEXTROSE 1-4 GM/50ML-% IV SOLN: 1.0000 g | INTRAVENOUS | Status: DC

## 2019-08-02 NOTE — Plan of Care (Signed)
Discussed with patient plan of care for the evening, pain management, PEG tube insertion and calling his wife for him to talk to her with some teach back displayed at this time.

## 2019-08-02 NOTE — Progress Notes (Signed)
STROKE TEAM PROGRESS NOTE   INTERVAL HISTORY: Nurse at bedside. Patient vomited several times, iarrhea, tube feeds held, pulse ox and O2 stable, ordered guaic on the emesis and stool to look for bleeding, cxr and abd xr ordered as well as cmp and cbc, CT head stat, afebrile. Will follow.  Vitals:   08/02/19 0600 08/02/19 0605 08/02/19 0820 08/02/19 0932  BP:   (!) 160/87 132/72  Pulse:   (!) 112 87  Resp: (!) 23 (!) 21 15   Temp:   98.8 F (37.1 C)   TempSrc:   Axillary   SpO2:   96%   Weight:      Height:       CBC:  Recent Labs  Lab 08/01/19 0320 08/02/19 0443  WBC 8.1 10.0  HGB 9.7* 9.6*  HCT 30.1* 30.1*  MCV 93.5 94.7  PLT 374 614   Basic Metabolic Panel:  Recent Labs  Lab 07/31/19 1159 07/31/19 1159 08/01/19 0320 08/02/19 0443  NA 138   < > 137 137  K 3.1*   < > 5.0 4.0  CL 108   < > 107 107  CO2 23   < > 23 25  GLUCOSE 150*   < > 121* 146*  BUN 19   < > 21 21  CREATININE 1.26*   < > 1.24 1.29*  CALCIUM 8.0*   < > 8.1* 8.2*  MG 1.4*  --   --   --    < > = values in this interval not displayed.    IMAGING past 24 hours No results found.   PHYSICAL EXAM      Clears lungs, soft non-tender abdomen, RRR  Neurologic exam: Frail elderly thin white male, says his neck hurts, regular rate and rhythm, he is alert and awake, oriented x3, he follows simple commands without aphasia but with dysarthria and perseveration, can name and repeat, blinks to threat bilaterally, extraocular muscles intact, pupils appear equally round and reactive to light, left lower facial weakness however he reports sensation is intact in his face, tongue and uvula midline, sensation intact, not ataxic or dysmetric. Sensation is intact to light touch in all extremities, poor effort on strength exam can lift all extremities antigravity without drift right side appears 5 out of 5 the left with mild hemiparesis.   ASSESSMENT/PLAN [name] is a 79 y.o. male with history of Afib,  systolic CHF, HLD, CAD, HTN, prostate cancer presenting with left sided weakness, facial droop, dysarthria.   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 embolic secondary to known AF even on Eliquis  Code Stroke CT head evolving R MCA territory infarct (insula, cerebral cortex). hyperdense R M2 +/- M3. ASPECTS 7    CTA head & neck +LVO. Probable ruptured plaque R ICA bifurcation w/ near occlusive stenosis, possible superimposed dissection. Distal reconstitution R M1 from collaterals but w/ distal embolic occlusion proximal R M2/3 superior division branch. L ICA bifurcation and siphon atherosclerosis. B VA origins 50% stenoses. R subclavian artery origin 60% stenosis.   CT perfusion 19cc core infarct R insula and posterior frontal. 33cc penumbra.   Cerebral angio TICI3 revascularization occluded middle trifurcation R MCA branch, revascularization R ICA occlusion w/ angioplasty and stenting.   Post IR CT contrast stain R putamen and moderate R sylvian fissure, SAH +  contrast.    CT head R sylvian fissure SAH. Increased contrast vs hemorrhage R suprasellar cistern following ICA concerned for increased SAH. R sulci effacement.   Carotid Doppler  R ICA stent patent  MRI  Patchy R MCA infarcts. SAH R sylvian fissure. Small IVH.   MRA - Severely motion degraded head MRA. Persistent patency of the revascularized right ICA. No flow limiting proximal stenosis identified.   CT head repeat 5/11 R insular infarct stable. Residual small SAH and small IVH.  CT head repeat 5/13 evolving R MCA infarct, SAH unchanged   2D Echo EF 35-40%. No source of embolus   LDL 90  HgbA1c 5.8  SCDs for VTE prophylaxis  Eliquis (apixaban) daily prior to admission, now on aspirin 81 mg daily and Brilinta (ticagrelor) 90 mg bid. AC so far on hold given continued SAH and  IVH on CT, and PEG placement for Monday. Consider to repeat CT next week after PEG and decide on [facility], will order CT Head for Tuesday Morning  Therapy recommendations:  CIR - > pt w/o family support at home. Will need SNF  Disposition:  pending  Acute hypoxemic respiratory failure, resolved Likely Aspiration PNA, treated  Extubated w/ CCM signing off 5/8  Vomiting x 5 with possible aspiration requiring NRB 5/10. CCM reconsulted. Transferred to ICU and Intubated  Extubated 5/11 w/o difficulties  Unasyn 5/10>>5/17 for 7 days  Leukocytosis - resolved  CXR - slight improvement aeration LLL w/ atx/infiltrate. Cardiomegaly.   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 Hold AC given post IR SAH and need for DAPT post stent placement. PEG Monday. Repeat head CT after PEG (Monday or Tuesday) morning and consider restarting eliquis. . Bradycardia resolved off sedation . On metoprolol 12.5 bid w/ tachycardia past 24h - increase metoprolol to 25 bid  . Consider AC once SAH resolves and PEG placed, will consider to repeat CT after PEG on Monday or tuesday.   Hypertension  Home meds:  Hydralazine 50 tid, metoprolol 25 bid  On Hydralazine 25 mg Q8 and metoprolol 25 bid . SBP 120s, on the low end  . Long-term BP goal normotensive  Hyperlipidemia  Home meds:  No statin  LDL 90, goal < 70  May consider statin again ->pt adamantly has refused statins in the past  AKI on CKD   Cre 1.29->1.26->1.24->1.29  Renal US R w/ increased echogenicity. 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 Secondary to stroke . SLP difficulty d/t pt participation. . D1 puree w/ honey thick liquids -> pt not swallowing and eating enough for daily needs -> Back to continuous feeding -> off TF . Free water 150 mL q6h . IV D5/NS at 75 cc/hr . Encourage po intake . Pt irritated by cortrak and persistent diarrhea -> hold off TF and on  IVF . Discussed with wife in length, and she is agreeable for PEG, pt is agreeable with PEG -> surgery consulted -> anticipate PEG placement Monday or Tuesday . Speech and dietician on board . Diarrhea stopped with TF on hold - continue imodium scheduled  Other Stroke Risk Factors  Advanced age  Coronary artery disease  Chronic systolic Congestive heart failure  Other Active Problems  GERD on PPI   BPH on flomax PTA  Prostate cancer   Hypokalemia - resolved 3.4->4.0->3.4-3.1->5.0->4.6   LBBB. Appears old. EKG neg ischemia. Trops 27->32  (Likely strain related)  Small blood clots in loose stool with recurrence. (No indication for colonoscopy) Hgb 11.7->9.6->9.7->9.6 Continue to monitor.    Loose stool on imodium scheduled  Thrombocytosis - 509->361(resolved)->374->328  Patient vomited several times today with loose stools, tube feeds held, pulse ox and O2 stable, ordered guaic on the emesis and stool to look for bleeding, cxr and abd xr ordered as well as cmp and cbc, afebrile.   PLAN  PEG planned for Tuesday  Repeat Head CT after PEG  Consider anticoagulation after PEG and CT  Monitor labs  Addendum -11:55 AM Nurse called - pt having NV and diarrhea. She was concerned about possible aspiration and suggested flex seal and continuous pulse ox. Discussed with Dr Jaynee Eagles who evaluated patient. Stat CMP, CBC, CXR and Abd x-ray, CT head  stat ordered as well as guiacs for emesis and stool. Pt made NPO. Will D/C and PO meds not absolutely needed at this time such as Lipitor 40 mg daily. Need to R/O ileus as pt has been getting scheduled imodium for diarrhea. Nurse suggested NG tube to decompress abdomen but was not able to handle CorTrak so I'm not sure he would tolerate NG.  May need to consider GI consult if no improvement.    Hospital Day #24    Patient vomited several times, loose stools, tube feeds held, pulse ox and O2 stable, ordered guaic on the emesis and stool to look for bleeding, cxr and abd xr ordered as well as cmp and cbc, CT of the head - afebrile, wbcs normal  Personally examined patient and images, and have participated in and made any corrections needed to history, physical, neuro exam,assessment and plan as stated above.  I have personally obtained the history, evaluated lab date, reviewed imaging studies and agree with radiology interpretations.    Sarina Ill, MD Stroke Neurology   A total of 25 minutes was spent for the care of this patient, spent on counseling patient and family on different diagnostic and therapeutic options, counseling and coordination of care, riskd ans benefits of management, compliance, or risk factor reduction and education.   To contact Stroke Continuity provider, please refer to [email or web address] After hours, contact General Neurology

## 2019-08-02 NOTE — Plan of Care (Cosign Needed)
Pt now NPO due to N,V and diarrhea. As discussed with Dr Jaynee Eagles will stop free water through tube and change D5NS to NS at 100cc/hr. Check labs in AM. Monitor for any S&S of CHF.  Mikey Bussing PA-C [facility] Pager [phone] 08/02/2019, 3:57 PM

## 2019-08-02 NOTE — Significant Event (Signed)
Rapid Response Event Note  Overview: RN called because patient actively vomiting and is concerned regarding patient's ability to clear his airway.  Initial Focused Assessment: Patient is sitting upright in the bed complaining of neck pain. He is actively suctioning his mouth with a small amount of black. There is about 175 cc of coffee ground emesis and tube feed in the suction canister. Lung sounds clear decreased bases. No increase WOB or increased RR  BP  160/87  HR 112  RR 15  O2 sat 96% on 2L [place]  Interventions: Oral care,  No additional emesis noted Patient endorses abdominal tenderness to the touch. RN has placed TF on hold.  BP 124/64  HR 62  RR 16  O2 sat 98% on 2L [place]  Plan of Care  RN to call if patient has additional vomiting or has respiratory distress  Event Summary:  Event End: [place]  Raliegh Ip

## 2019-08-02 NOTE — Progress Notes (Addendum)
Pt drowsy over the entire shift with multiple episodes of incontinence; unable to obtain strict counts, but approx 6 wet pads. Diarrhea and dark brown emesis in the morning that slowed down throughout the shift; unable to test for blood.   0830- Pt c/o nausea while lying flat for bedbath HOB raised, TF held, IV zofran given Pt with ~5 episodes of emesis Pt instructed to cough after emesis, but unable to (his attempt seemed more like an attempt to vomit again) Total volume emesis ~400cc all dark brown / black in color without coffee ground appearance VSS - Rapid response nurse called to assess airway protection Stroke team paged  1000- Meds given via tube after TF held and no emesis x 1 hr  1030- Pt w single episode of emesis, also dark brown in color Stroke team repaged  1100- Stroke PA contacted via secure chat and updated  1130- Neurologist at bedside to assess and updated on pt's morning (see new orders)  1300- FMS insertion deferred r/t ileus seen on DX abd  1600- Device failure causing inaccurate data; pt appeared to have increasing O2 requirements after returning from CT. Concern for aspiration event communicated with Jaynee Eagles, MD. Sat probe replaced and pt easily returned to 2L O2.  1800- Called and updated pt's wife, Delia. Also facilitated pt talking to wife over the phone.

## 2019-08-03 LAB — COMPREHENSIVE METABOLIC PANEL
ALT: 72 U/L — ABNORMAL HIGH (ref 0–44)
AST: 62 U/L — ABNORMAL HIGH (ref 15–41)
Albumin: 1.8 g/dL — ABNORMAL LOW (ref 3.5–5.0)
Alkaline Phosphatase: 75 U/L (ref 38–126)
Anion gap: 8 (ref 5–15)
BUN: 22 mg/dL (ref 8–23)
CO2: 26 mmol/L (ref 22–32)
Calcium: 8.7 mg/dL — ABNORMAL LOW (ref 8.9–10.3)
Chloride: 109 mmol/L (ref 98–111)
Creatinine, Ser: 1.35 mg/dL — ABNORMAL HIGH (ref 0.61–1.24)
GFR calc Af Amer: 58 mL/min — ABNORMAL LOW (ref >60)
GFR calc non Af Amer: 50 mL/min — ABNORMAL LOW (ref >60)
Glucose, Bld: 105 mg/dL — ABNORMAL HIGH (ref 70–99)
Potassium: 4.2 mmol/L (ref 3.5–5.1)
Sodium: 143 mmol/L (ref 135–145)
Total Bilirubin: 1.1 mg/dL (ref 0.3–1.2)
Total Protein: 5.3 g/dL — ABNORMAL LOW (ref 6.5–8.1)

## 2019-08-03 LAB — CBC
HCT: 30.3 % — ABNORMAL LOW (ref 39.0–52.0)
Hemoglobin: 9.6 g/dL — ABNORMAL LOW (ref 13.0–17.0)
MCH: 30.4 pg (ref 26.0–34.0)
MCHC: 31.7 g/dL (ref 30.0–36.0)
MCV: 95.9 fL (ref 80.0–100.0)
Platelets: 361 10*3/uL (ref 150–400)
RBC: 3.16 MIL/uL — ABNORMAL LOW (ref 4.22–5.81)
RDW: 14.2 % (ref 11.5–15.5)
WBC: 8.2 10*3/uL (ref 4.0–10.5)
nRBC: 0 % (ref 0.0–0.2)

## 2019-08-03 LAB — SURGICAL PCR SCREEN
MRSA, PCR: POSITIVE — AB
Staphylococcus aureus: POSITIVE — AB

## 2019-08-03 LAB — GLUCOSE, CAPILLARY
Glucose-Capillary: 107 mg/dL — ABNORMAL HIGH (ref 70–99)
Glucose-Capillary: 99 mg/dL (ref 70–99)
Glucose-Capillary: 109 mg/dL — ABNORMAL HIGH (ref 70–99)
Glucose-Capillary: 88 mg/dL (ref 70–99)
Glucose-Capillary: 101 mg/dL — ABNORMAL HIGH (ref 70–99)

## 2019-08-03 MED ORDER — CHLORHEXIDINE GLUCONATE CLOTH 2 % EX PADS
6.0000 | MEDICATED_PAD | Freq: Every day | CUTANEOUS | Status: AC
  Administered 2019-08-03 – 2019-08-07 (×5): 6 via TOPICAL

## 2019-08-03 MED ORDER — MUPIROCIN 2 % EX OINT
1.0000 "application " | TOPICAL_OINTMENT | Freq: Two times a day (BID) | CUTANEOUS | Status: AC
  Administered 2019-08-03 – 2019-08-07 (×10): 1 via NASAL
  Filled 2019-08-03: qty 22

## 2019-08-03 NOTE — Progress Notes (Addendum)
  Speech Language Pathology Treatment: Dysphagia  Patient Details Name: Stephen Copeland MRN: [id number] DOB: 1940/11/02 Today's Date: 08/03/2019 Time: 4098-1191 SLP Time Calculation (min) (ACUTE ONLY): 17 min  Assessment / Plan / Recommendation Clinical Impression  Pt was seen for skilled ST targeting dysphagia.  Pt had episodes of emesis yesterday and is currently NPO with tube feeds held.  Spoke with RN who stated that pt could have bites of puree/honey-thick liquid in this session.  She was present for a portion of this session and she helped to verbally encourage the pt.  Offered the pt multiple choices of purees and honey-thick liquids and he initially refused all of them and stated that he wanted a chicken salad sandwich instead.  Explained the pt's dysphagia and aspiration risk; however he would benefit from additional education.  Pt was eventually agreeable to 1 tsp of preferred honey-thick liquid, but he refused all additional PO trials.  Pt demonstrated good bolus acceptance and suspect timely AP transport.  Possible delayed swallow initiation, and a delayed cough (>45 seconds) was noted following the honey-thick liquid trial.  Pt's speech was approximately 70% intelligible on this date.  Re-educated the pt regarding speech intelligibility strategies including over-articulation and loud speech.  He stated that he had gone to language school for many years and that he spoke "perfectly fine".  Recommend continuation of Dysphagia 1 (puree) solids and honey-thick liquids as tolerated.  RN stated that pt will be NPO beginning at midnight for PEG placement tomorrow.  SLP will f/u for tx after PEG placement.    HPI HPI: 79 y/o M admitted 5/6 with right sided CVA and remained intubated post thrombectomy and carotid stent. He was outside the TPA window. Intubated 5/6-5/7, then on 5/10 pt had multiple episodes of vomiting brown gastric contents and was reintubted. Pt was seen by SLP in the interim, found to  have acute neurogenic dysphagia with signs of aspiration with minimal PO trials. Has Cortrak, on D1/HTL diet. Could not place PEG tube 5/25.      SLP Plan  Continue with current plan of care       Recommendations  Diet recommendations: Honey-thick liquid;Dysphagia 1 (puree) Liquids provided via: Teaspoon Medication Administration: Via alternative means Supervision: Full supervision/cueing for compensatory strategies;Trained caregiver to feed patient Compensations: Slow rate;Small sips/bites;Minimize environmental distractions Postural Changes and/or Swallow Maneuvers: Seated upright 90 degrees                Oral Care Recommendations: Oral care QID Follow up Recommendations: Skilled Nursing facility SLP Visit Diagnosis: Dysphagia, oropharyngeal phase (R13.12) Plan: Continue with current plan of care       GO               Colin Mulders M.S., CCC-SLP Acute Rehabilitation Services Office: [phone]  [place] 08/03/2019, 1:00 PM

## 2019-08-03 NOTE — Progress Notes (Signed)
STROKE TEAM PROGRESS NOTE   INTERVAL HISTORY: Repeat CT head next week and consider AC - s/p PEG.  Sunday 5/31: : EMESIS, LOOSE STOOLS, HGB stable, repeated CT head, cxr and abd xray all negative, today resolved and restarted tube feeds lower rate, follow in the morning.  PEG Tuesday June 1  Patient feels improved,  more alert, no vomiting or diarrhea, labs stable. Sating well on 2L stable.   Vitals:   08/03/19 0500 08/03/19 0600 08/03/19 0726 08/03/19 1206  BP:   129/62 (!) 167/98  Pulse:   (!) 58 72  Resp: 18 14 16 20   Temp:   98.5 F (36.9 C) (!) 97.3 F (36.3 C)  TempSrc:   Axillary Oral  SpO2:   99% 99%  Weight:      Height:       CBC:  Recent Labs  Lab 08/02/19 1159 08/03/19 1143  WBC 12.1* 8.2  HGB 12.0* 9.6*  HCT 37.8* 30.3*  MCV 95.2 95.9  PLT 268 854   Basic Metabolic Panel:  Recent Labs  Lab 07/31/19 1159 08/01/19 0320 08/02/19 1159 08/03/19 1143  NA 138   < > 137 143  K 3.1*   < > 5.8* 4.2  CL 108   < > 107 109  CO2 23   < > 22 26  GLUCOSE 150*   < > 104* 105*  BUN 19   < > 24* 22  CREATININE 1.26*   < > 1.06 1.35*  CALCIUM 8.0*   < > 8.5* 8.7*  MG 1.4*  --   --   --    < > = values in this interval not displayed.    IMAGING past 24 hours CT HEAD WO CONTRAST  Result Date: 08/02/2019 CLINICAL DATA:  Stroke, follow-up EXAM: CT HEAD WITHOUT CONTRAST TECHNIQUE: Contiguous axial images were obtained from the base of the skull through the vertex without intravenous contrast. COMPARISON:  07/16/2019 FINDINGS: Brain: Similar extent of previously seen right MCA territory infarction. Previous mild mass effect has essentially resolved. Hyperdense subarachnoid hemorrhage has resolved. There is no new hemorrhage or loss of gray-white differentiation. Ventricles are stable in size. Stable findings of probable chronic microvascular ischemic changes. Vascular: There is atherosclerotic calcification at the skull base. Skull: Calvarium is unremarkable. Sinuses/Orbits:  No acute finding. Other: None. 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 Previously seen hemorrhage has resolved. No new findings. Electronically Signed   By: Macy Mis M.D.   On: 08/02/2019 14:50     PHYSICAL EXAM     soft non-tender abdomen, RRR  Neurologic exam: Frail elderly thin white male, regular rate and rhythm, he is alert and awake, oriented x3, he follows simple commands without aphasia but with dysarthria and perseveration, can name and repeat, blinks to threat bilaterally, extraocular muscles intact, pupils appear equally round and reactive to light, left lower facial weakness however he reports sensation is intact in his face, tongue and uvula midline, sensation intact, not ataxic or dysmetric. Sensation is intact to light touch in all extremities, poor effort on strength exam can lift all extremities antigravity without drift right side appears 5 out of 5 the left with mild hemiparesis.   ASSESSMENT/PLAN [name] is a 79 y.o. male with history of Afib, systolic CHF, HLD, CAD, HTN, prostate cancer presenting with left sided weakness, facial droop, dysarthria.   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 embolic secondary to known AF even on Eliquis  Code Stroke CT head evolving R MCA territory infarct (insula, cerebral cortex). hyperdense R M2 +/- M3. ASPECTS 7    CTA head & neck +LVO. Probable ruptured plaque R ICA bifurcation w/ near occlusive stenosis, possible superimposed dissection. Distal reconstitution R M1 from collaterals but w/ distal embolic occlusion proximal R M2/3 superior division branch. L ICA bifurcation and siphon atherosclerosis. B VA origins 50% stenoses. R subclavian artery origin 60% stenosis.   CT perfusion 19cc core infarct R insula and posterior frontal. 33cc penumbra.    Cerebral angio TICI3 revascularization occluded middle trifurcation R MCA branch, revascularization R ICA occlusion w/ angioplasty and stenting.   Post IR CT contrast stain R putamen and moderate R sylvian fissure, SAH + contrast.    CT head R sylvian fissure SAH. Increased contrast vs hemorrhage R suprasellar cistern following ICA concerned for increased SAH. R sulci effacement.   Carotid Doppler  R ICA stent patent  MRI  Patchy R MCA infarcts. SAH R sylvian fissure. Small IVH.   MRA - Severely motion degraded head MRA. Persistent patency of the revascularized right ICA. No flow limiting proximal stenosis identified.   CT head repeat 5/11 R insular infarct stable. Residual small SAH and small IVH.  CT head repeat 5/13 evolving R MCA infarct, SAH unchanged   2D Echo EF 35-40%. No source of embolus   LDL 90  HgbA1c 5.8  SCDs for VTE prophylaxis  Eliquis (apixaban) daily prior to admission, now on aspirin 81 mg daily and Brilinta (ticagrelor) 90 mg bid. AC so far on hold given continued SAH and IVH on CT, and PEG placement for Tuesday June 1st. Consider to repeat CT after PEG and decide on [facility]  Therapy recommendations:  CIR - > pt w/o family support at home. Will need SNF  Disposition:  pending  Acute hypoxemic respiratory failure, resolved Likely Aspiration PNA, treated  Extubated w/ CCM signing off 5/8  Vomiting x 5 with possible aspiration requiring NRB 5/10. CCM reconsulted. Transferred to ICU and Intubated  Extubated 5/11 w/o difficulties  Unasyn 5/10>>5/17 for 7 days  Leukocytosis - resolved  CXR - slight improvement aeration LLL w/ atx/infiltrate. Cardiomegaly.   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 Hold AC given post IR SAH and need for DAPT post stent placement. PEG Monday. Repeat head CT after PEG (Monday or Tuesday) morning and consider restarting eliquis. . Bradycardia resolved off sedation . On metoprolol 12.5 bid w/ tachycardia past 24h - increase metoprolol to 25 bid  . Consider AC once SAH resolves and PEG placed, will consider to repeat CT after PEG on Tuesday.   Hypertension  Home meds:  Hydralazine 50 tid, metoprolol 25 bid  On Hydralazine 25 mg Q8 and metoprolol 25 bid . SBP 120s, on the low end  . Long-term BP goal normotensive  Hyperlipidemia  Home meds:  No statin  LDL 90, goal < 70  May consider statin again ->pt adamantly has refused statins in the past  AKI on  CKD   Cre 1.29->1.26->1.24->1.29 -> 1.35 restarting Tube Feeds and he is also on NS 100/hr  Renal US R w/ increased echogenicity. No hydro  Urine NA neg  Stopped free water 150 Q6  Leukocytosis, resolved  Temp 100.5->afebrile - 98.5->98.8 ax  UA neg  CXR unremarkable  Continue monitoring  Chronic HFrEF Dilated Cardiomyopathy LBBB Prolonged QT  EF 30 to 40% since 2017  This admission EF 35 to 40%  CXR no pulmonary edema  Dysphagia . Secondary to stroke . SLP difficulty d/t pt participation. . D1 puree w/ honey thick liquids -> pt not swallowing and eating enough for daily needs -> Back to continuous feeding -> restarted TFs t a lower rate due to vomiting Sunday 5/30 . Stopped Free water 150 mL q6h . IV NS at 100 cc/hr . Encourage po intake . Pt irritated by cortrak and persistent diarrhea -> diarrhea and vomiting 5/30 resoled and restarted TFs at lower rate . Discussed with wife in length, and she is agreeable for PEG, pt is agreeable with PEG -> surgery consulted -> anticipate PEG placement uesday . Speech and dietician on board . Sunday 5/31: : EMESIS, LOOSE STOOLS, HGB stable, repeated CT head, cxr  and abd xray all negative, today resolved and restarted tube feeds lower rate, follow in the morning.  PEG Tuesday June 1st planned  Other Stroke Risk Factors  Advanced age  Coronary artery disease  Chronic systolic Congestive heart failure  Other Active Problems  GERD on PPI   BPH on flomax PTA  Prostate cancer   Hypokalemia - resolved 3.4->4.0->3.4-3.1->5.0->4.6   LBBB. Appears old. EKG neg ischemia. Trops 27->32  (Likely strain related)  Small blood clots in loose stool with recurrence. (No indication for colonoscopy) Hgb 11.7->9.6->9.7->9.6 Continue to monitor.    Loose stool on imodium scheduled  Thrombocytosis - 509->361(resolved)->374->328  Patient vomited several times today with loose stools, tube feeds held, pulse ox and O2 stable, ordered guaic on the emesis and stool to look for bleeding, cxr and abd xr ordered as well as cmp and cbc, afebrile.   PLAN  PEG planned for Tuesday  Repeat Head CT after PEG  Consider anticoagulation after PEG and CT  Monitor labs  Hospital Day #25   Personally examined patient and images, and have participated in and made any corrections needed to history, physical, neuro exam,assessment and plan as stated above.  I have personally obtained the history, evaluated lab date, reviewed imaging studies and agree with radiology interpretations.    Sarina Ill, MD Stroke Neurology   A total of 25 minutes was spent for the care of this patient, spent on counseling patient and family on different diagnostic and therapeutic options, counseling and coordination of care, riskd ans benefits of management, compliance, or risk factor reduction and education.    To contact Stroke Continuity provider, please refer to [email or web address] After hours, contact General Neurology

## 2019-08-03 NOTE — Progress Notes (Signed)
Physical Therapy Treatment Patient Details Name: Stephen Copeland MRN: [id number] DOB: 27-Mar-1940 Today's Date: 08/03/2019    History of Present Illness 79 y.o. male admitted on 07/09/19 for L sided weakness, facial droop, and slurred speech.  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  He did not get any tPA as he was outside of the window.  Pt intubated 5/6 for procedure and extubated in the AM of 07/10/19.  Pt with other significant PMH of sinus brady, scoliosis, prostate CA s/p surgery, HTN, dilated cardiomyopathy, CAD, CHF.  Patient re-intubated 5/10 with inability to clear secretions.. Extubated 5/11.     PT Comments    Patient seen for mobility progression. Pt requires mod-max A +2 for bed mobilityPt declined attempting further mobility after sitting EOB despite encouragement and perseverating on feeling dizzy. PT will continue to follow acutely and progress as tolerated. Continue to recommend post acute rehab.     Follow Up Recommendations  SNF     Equipment Recommendations  Rolling walker with 5" wheels;Wheelchair (measurements PT);Wheelchair cushion (measurements PT);3in1 (PT)    Recommendations for Other Services       Precautions / Restrictions Precautions Precautions: Fall Precaution Comments: coretrack Restrictions Weight Bearing Restrictions: No    Mobility  Bed Mobility Overal bed mobility: Needs Assistance Bed Mobility: Supine to Sit;Sit to Supine     Supine to sit: +2 for physical assistance;Max assist;Mod assist Sit to supine: Max assist   General bed mobility comments: multimodal cues for sequencing; assist to bring bilat LE/hips to EOB and to elevate trunk into sitting  Transfers                    Ambulation/Gait                 Stairs             Wheelchair Mobility    Modified Rankin (Stroke Patients Only) Modified Rankin (Stroke Patients Only) Pre-Morbid Rankin Score: No symptoms Modified  Rankin: Moderately severe disability     Balance Overall balance assessment: Needs assistance Sitting-balance support: Feet supported Sitting balance-Leahy Scale: Fair                                      Cognition Arousal/Alertness: Awake/alert Behavior During Therapy: Flat affect Overall Cognitive Status: Impaired/Different from baseline Area of Impairment: Attention;Memory;Following commands;Safety/judgement;Problem solving                   Current Attention Level: Sustained Memory: Decreased short-term memory Following Commands: Follows one step commands with increased time;Follows one step commands inconsistently Safety/Judgement: Decreased awareness of safety;Decreased awareness of deficits   Problem Solving: Decreased initiation;Difficulty sequencing;Requires verbal cues;Requires tactile cues General Comments: pt perseverating on feeling dizzy      Exercises      General Comments General comments (skin integrity, edema, etc.): SpO2 93% on 3L O2 via [place]       Pertinent Vitals/Pain Pain Assessment: Faces Faces Pain Scale: Hurts a little bit Pain Location: neck   Pain Descriptors / Indicators: Guarding;Grimacing Pain Intervention(s): Limited activity within patient's tolerance;Monitored during session;Repositioned    Home Living                      Prior Function            PT Goals (current goals can now be found  in the care plan section) Progress towards PT goals: Progressing toward goals    Frequency    Min 3X/week      PT Plan Current plan remains appropriate    Co-evaluation              AM-PAC PT "6 Clicks" Mobility   Outcome Measure  Help needed turning from your back to your side while in a flat bed without using bedrails?: A Lot Help needed moving from lying on your back to sitting on the side of a flat bed without using bedrails?: A Lot Help needed moving to and from a bed to a chair (including a  wheelchair)?: A Lot Help needed standing up from a chair using your arms (e.g., wheelchair or bedside chair)?: A Lot Help needed to walk in hospital room?: Total Help needed climbing 3-5 steps with a railing? : Total 6 Click Score: 10    End of Session   Activity Tolerance: Other (comment)(c/o dizziness) Patient left: in bed;with call bell/phone within reach;with bed alarm set Nurse Communication: Mobility status PT Visit Diagnosis: Muscle weakness (generalized) (M62.81);Other abnormalities of gait and mobility (R26.89);Other symptoms and signs involving the nervous system (R29.898);Hemiplegia and hemiparesis Hemiplegia - Right/Left: Left Hemiplegia - dominant/non-dominant: Non-dominant Hemiplegia - caused by: Cerebral infarction     Time: 0964-3838 PT Time Calculation (min) (ACUTE ONLY): 24 min  Charges:  $Therapeutic Activity: 23-37 mins                     Earney Navy, PTA Acute Rehabilitation Services Pager: [phone] Office: [phone]     Darliss Cheney 08/03/2019, 11:50 AM

## 2019-08-03 NOTE — Plan of Care (Signed)
Discussed with patient plan of care for the evening, pain management and called his wife so they could talk to them tonight with some teach back displayed.

## 2019-08-04 ENCOUNTER — Inpatient Hospital Stay (HOSPITAL_COMMUNITY): Payer: Medicare Other

## 2019-08-04 ENCOUNTER — Inpatient Hospital Stay (HOSPITAL_COMMUNITY): Payer: Medicare Other | Admitting: Certified Registered Nurse Anesthetist

## 2019-08-04 ENCOUNTER — Encounter (HOSPITAL_COMMUNITY): Payer: Self-pay | Admitting: Student in an Organized Health Care Education/Training Program

## 2019-08-04 ENCOUNTER — Encounter (HOSPITAL_COMMUNITY)
Admission: EM | Disposition: A | Discharge status: Skilled Nursing Facility | Payer: Self-pay | Source: Home / Self Care | Attending: Neurology

## 2019-08-04 HISTORY — PX: PEG PLACEMENT: SHX5437

## 2019-08-04 HISTORY — PX: ESOPHAGOGASTRODUODENOSCOPY (EGD) WITH PROPOFOL: SHX5813

## 2019-08-04 LAB — GLUCOSE, CAPILLARY
Glucose-Capillary: 101 mg/dL — ABNORMAL HIGH (ref 70–99)
Glucose-Capillary: 109 mg/dL — ABNORMAL HIGH (ref 70–99)
Glucose-Capillary: 113 mg/dL — ABNORMAL HIGH (ref 70–99)
Glucose-Capillary: 113 mg/dL — ABNORMAL HIGH (ref 70–99)
Glucose-Capillary: 117 mg/dL — ABNORMAL HIGH (ref 70–99)
Glucose-Capillary: 130 mg/dL — ABNORMAL HIGH (ref 70–99)
Glucose-Capillary: 151 mg/dL — ABNORMAL HIGH (ref 70–99)
Glucose-Capillary: 85 mg/dL (ref 70–99)
Glucose-Capillary: 96 mg/dL (ref 70–99)

## 2019-08-04 LAB — BASIC METABOLIC PANEL
Anion gap: 14 (ref 5–15)
BUN: 21 mg/dL (ref 8–23)
CO2: 23 mmol/L (ref 22–32)
Calcium: 8.8 mg/dL — ABNORMAL LOW (ref 8.9–10.3)
Chloride: 105 mmol/L (ref 98–111)
Creatinine, Ser: 1.23 mg/dL (ref 0.61–1.24)
GFR calc Af Amer: >60 mL/min (ref >60)
GFR calc non Af Amer: 56 mL/min — ABNORMAL LOW (ref >60)
Glucose, Bld: 103 mg/dL — ABNORMAL HIGH (ref 70–99)
Potassium: 3.8 mmol/L (ref 3.5–5.1)
Sodium: 142 mmol/L (ref 135–145)

## 2019-08-04 LAB — CBC
HCT: 35.8 % — ABNORMAL LOW (ref 39.0–52.0)
Hemoglobin: 11.4 g/dL — ABNORMAL LOW (ref 13.0–17.0)
MCH: 30 pg (ref 26.0–34.0)
MCHC: 31.8 g/dL (ref 30.0–36.0)
MCV: 94.2 fL (ref 80.0–100.0)
Platelets: 412 10*3/uL — ABNORMAL HIGH (ref 150–400)
RBC: 3.8 MIL/uL — ABNORMAL LOW (ref 4.22–5.81)
RDW: 14 % (ref 11.5–15.5)
WBC: 11.6 10*3/uL — ABNORMAL HIGH (ref 4.0–10.5)
nRBC: 0 % (ref 0.0–0.2)

## 2019-08-04 LAB — TROPONIN I (HIGH SENSITIVITY): Troponin I (High Sensitivity): 17 ng/L (ref <18)

## 2019-08-04 SURGERY — ESOPHAGOGASTRODUODENOSCOPY (EGD) WITH PROPOFOL
Anesthesia: Monitor Anesthesia Care

## 2019-08-04 MED ORDER — PROPOFOL 500 MG/50ML IV EMUL
INTRAVENOUS | Status: DC | PRN
  Administered 2019-08-04: 75 ug/kg/min via INTRAVENOUS
  Administered 2019-08-04: 25 ug/kg/min via INTRAVENOUS

## 2019-08-04 MED ORDER — LACTATED RINGERS IV SOLN: INTRAVENOUS | Status: DC | PRN

## 2019-08-04 MED ORDER — JEVITY 1.5 CAL/FIBER PO LIQD
1000.0000 mL | ORAL | Status: DC
  Administered 2019-08-04: 1000 mL
  Filled 2019-08-04 (×2): qty 1000

## 2019-08-04 MED ORDER — CEFAZOLIN SODIUM-DEXTROSE 2-4 GM/100ML-% IV SOLN
INTRAVENOUS | Status: AC
  Filled 2019-08-04: qty 100

## 2019-08-04 MED ORDER — PROPOFOL 10 MG/ML IV BOLUS
INTRAVENOUS | Status: DC | PRN
  Administered 2019-08-04: 20 mg via INTRAVENOUS

## 2019-08-04 MED ORDER — LACTATED RINGERS IV SOLN
INTRAVENOUS | Status: DC
  Administered 2019-08-04: 1000 mL via INTRAVENOUS

## 2019-08-04 NOTE — Progress Notes (Signed)
ABD Binder applied to patient

## 2019-08-04 NOTE — Progress Notes (Signed)
PT Cancellation Note  Patient Details Name: Stephen Copeland MRN: [id number] DOB: 16-Dec-1940   Cancelled Treatment:    Reason Eval/Treat Not Completed: Patient not medically ready. Patient had PEG placement earlier today, then rapid response due to tachycardia. Will hold for today and re-attempt tomorrow.     Giorgia Wahler 08/04/2019, 2:43 PM

## 2019-08-04 NOTE — Op Note (Signed)
[facility] Patient Name: Stephen Copeland Procedure Date : 08/04/2019 MRN: [id number] Attending MD: Jesusita Oka ,  Date of Birth: Jul 27, 1940 CSN: [id number] Age: 79 Admit Type: Inpatient Procedure:                Upper GI endoscopy Indications:              Dysphagia Providers:                Nada Boozer, RN, Doristine Johns, RN, Elspeth Cho Tech., Technician, Rejeana Brock, CRNA Referring MD:              Medicines:                See the Anesthesia note for documentation of the                            administered medications Complications:            No immediate complications. Estimated blood loss:                            Minimal. Estimated Blood Loss:     Estimated blood loss was minimal. Procedure:                Pre-Anesthesia Assessment:                           - The anesthesia plan was to use moderate                            sedation/analgesia (conscious sedation).                           After obtaining informed consent, the endoscope was                            passed under direct vision. Throughout the                            procedure, the patient's blood pressure, pulse, and                            oxygen saturations were monitored continuously. The                            GIF-H190 ([id number] Olympus gastroscope was                            introduced through the mouth, and advanced to the                            duodenal bulb. The upper GI endoscopy was  accomplished without difficulty. Scope In: Scope Out: Findings:      The esophagus was normal.      The stomach was normal.      The examined duodenum was normal. Impression:               - Normal esophagus.                           - Normal stomach.                           - Normal examined duodenum.                           - No specimens  collected. Recommendation:           - May administer water and medications now and may                            start tube feeds four hours post-procedure. Procedure Code(s):        --- Professional ---                           [phone], Esophagogastroduodenoscopy, flexible,                            transoral; diagnostic, including collection of                            specimen(s) by brushing or washing, when performed                            (separate procedure) Diagnosis Code(s):        --- Professional ---                           R13.10, Dysphagia, unspecified CPT copyright 2019 American Medical Association. All rights reserved. The codes documented in this report are preliminary and upon coder review may  be revised to meet current compliance requirements. Jesusita Oka,  08/04/2019 3:12:15 PM Number of Addenda: 0

## 2019-08-04 NOTE — Progress Notes (Signed)
OT Cancellation Note  Patient Details Name: Stephen Copeland MRN: [id number] DOB: Jan 26, 1941   Cancelled Treatment:    Reason Eval/Treat Not Completed: Medical issues which prohibited therapy Rapid response being called on pt due to tachycardic episode; will follow back when medically appropriate.   Corinne Ports E. Emlyn Maves, COTA/L Acute Rehabilitation Services Start 08/04/2019, 2:06 PM

## 2019-08-04 NOTE — TOC Progression Note (Signed)
Transition of Care [facility]) - Progression Note    Patient Details  Name: Stephen Copeland MRN: [id number] Date of Birth: 16-May-1940  [facility] CM/SW [place], [place] Work Phone Number: 08/04/2019, 1:04 PM  Clinical Narrative:    MSW Intern contacted [facility] in preparation for upcoming discharge. They noted that they will not have a bed tomorrow and that they have concerns that pt has not been cooperative with therapies. Will reevaluate status after surgery.   Expected Discharge Plan: [place] Barriers to Discharge: Continued Medical Work up  Expected Discharge Plan and Services Expected Discharge Plan: [place] arrangements for the past 2 months: Single Family Home                                       Social Determinants of Health (SDOH) Interventions    Readmission Risk Interventions No flowsheet data found.

## 2019-08-04 NOTE — Progress Notes (Signed)
Patient back from procedure.

## 2019-08-04 NOTE — Progress Notes (Signed)
PT Cancellation Note  Patient Details Name: Stephen Copeland MRN: [id number] DOB: 02-20-1941   Cancelled Treatment:    Reason Eval/Treat Not Completed: Patient at procedure or test/unavailable. Will re-attempt later today if time allows.    Mikeria Valin 08/04/2019, 10:20 AM

## 2019-08-04 NOTE — Anesthesia Preprocedure Evaluation (Addendum)
Anesthesia Evaluation  Patient identified by MRN, date of birth, ID band Patient awake    Reviewed: Allergy & Precautions, NPO status , Patient's Chart, lab work & pertinent test results, reviewed documented beta blocker date and time   Airway Mallampati: II  TM Distance: >3 FB Neck ROM: Full    Dental no notable dental hx. (+) Teeth Intact, Dental Advisory Given   Pulmonary  Intubated 5/6 for code stroke- remained intubated for 2d d/t AHRF likely 2/2 aspiration. Extubated 07/11/19   Pulmonary exam normal breath sounds clear to auscultation       Cardiovascular hypertension, Pt. on home beta blockers + CAD and +CHF  + dysrhythmias Atrial Fibrillation  Rhythm:Irregular Rate:Tachycardia  Echo 07/10/19:  1. Left ventricular ejection fraction, by estimation, is 35 to 40%. The left ventricle has moderately decreased function. The left ventricle demonstrates global hypokinesis. There is mild concentric left ventricular hypertrophy. Left ventricular diastolic function could not be evaluated.  2. Right ventricular systolic function is normal. The right ventricular size is normal. There is normal pulmonary artery systolic pressure.  3. The mitral valve was not well visualized. No evidence of mitral valve regurgitation.  4. The aortic valve was not well visualized. Aortic valve regurgitation is not visualized.   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 embolic secondary to known AF even on Eliquis  follows simple commands without aphasia but with dysarthria and perseveration, left lower facial weakness however he reports sensation is intact in his face, right side 5 out of 5. left side with mild hemiparesis. CVA, Residual Symptoms negative psych ROS    GI/Hepatic Neg liver ROS, hiatal hernia, PUD, GERD  Medicated and Controlled,Dysphagia    Endo/Other  negative endocrine ROS  Renal/GU ARF and CRFRenal diseaseCr 1.29, AKI on CKD with this admission for stroke    Hx prostate ca negative genitourinary   Musculoskeletal negative musculoskeletal ROS (+)   Abdominal Normal abdominal exam  (+)   Peds  Hematology negative hematology ROS (+)   Anesthesia Other Findings Breathing intermittently labored, maintains SpO2  Reproductive/Obstetrics negative OB ROS                            Anesthesia Physical Anesthesia Plan  ASA: IV  Anesthesia Plan: MAC   Post-op Pain Management:    Induction: Intravenous  PONV Risk Score and Plan: Propofol infusion and TIVA  Airway Management Planned: Natural Airway and Nasal Cannula  Additional Equipment: None  Intra-op Plan:   Post-operative Plan:   Informed Consent: I have reviewed the patients History and Physical, chart, labs and discussed the procedure including the risks, benefits and alternatives for the proposed anesthesia with the patient or authorized representative who has indicated his/her understanding and acceptance.       Plan Discussed with: CRNA  Anesthesia Plan Comments:        Anesthesia Quick Evaluation

## 2019-08-04 NOTE — Progress Notes (Signed)
STROKE TEAM PROGRESS NOTE   INTERVAL HISTORY: RN at bedside. Pt came back from OR for PEG placement. Continues to have tachycardia, EEG unchanged but tachycardia with PVCs. Resume metoprolol po from PEG.    Vitals:   08/04/19 0938 08/04/19 1112 08/04/19 1122 08/04/19 1202  BP: (!) 182/98 (!) 160/61 (!) 173/99 (!) 174/89  Pulse: 97 (!) 102 96 86  Resp: 12 15 13 18   Temp: 99 F (37.2 C) 98.8 F (37.1 C)  97.7 F (36.5 C)  TempSrc: Oral Axillary  Oral  SpO2: 97% 95% 97% 99%  Weight: 83.9 kg     Height: 6\' 2"  (1.88 m)      CBC:  Recent Labs  Lab 08/02/19 1159 08/03/19 1143  WBC 12.1* 8.2  HGB 12.0* 9.6*  HCT 37.8* 30.3*  MCV 95.2 95.9  PLT 268 902   Basic Metabolic Panel:  Recent Labs  Lab 07/31/19 1159 08/01/19 0320 08/02/19 1159 08/03/19 1143  NA 138   < > 137 143  K 3.1*   < > 5.8* 4.2  CL 108   < > 107 109  CO2 23   < > 22 26  GLUCOSE 150*   < > 104* 105*  BUN 19   < > 24* 22  CREATININE 1.26*   < > 1.06 1.35*  CALCIUM 8.0*   < > 8.5* 8.7*  MG 1.4*  --   --   --    < > = values in this interval not displayed.    IMAGING past 24 hours No results found.   PHYSICAL EXAM  Neurologic exam: Frail elderly thin white male, regular rate and rhythm, he is alert and awake, oriented x3, he follows simple commands without aphasia but with dysarthria and perseveration, can name and repeat, blinks to threat bilaterally, extraocular muscles intact, pupils appear equally round and reactive to light, left lower facial weakness however he reports sensation is intact in his face, tongue and uvula midline, sensation intact, not ataxic or dysmetric. Sensation is intact to light touch in all extremities, poor effort on strength exam can lift all extremities antigravity without drift right side appears 5 out of 5 the left with mild hemiparesis.   ASSESSMENT/PLAN [name] is a 79 y.o. male with history of Afib, systolic CHF, HLD, CAD, HTN, prostate cancer presenting with left  sided weakness, facial droop, dysarthria.   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 embolic secondary to known AF even on Eliquis  Code Stroke CT head evolving R MCA territory infarct (insula, cerebral cortex). hyperdense R M2 +/- M3. ASPECTS 7    CTA head & neck +LVO. Probable ruptured plaque R ICA bifurcation w/ near occlusive stenosis, possible superimposed dissection. Distal reconstitution R M1 from collaterals but w/ distal embolic occlusion proximal R M2/3 superior division branch. L ICA bifurcation and siphon atherosclerosis. B VA origins 50% stenoses. R subclavian artery origin 60% stenosis.   CT perfusion 19cc core infarct R insula and posterior frontal. 33cc penumbra.   Cerebral angio TICI3 revascularization occluded middle trifurcation R MCA branch, revascularization R ICA occlusion w/ angioplasty and stenting.   Post IR CT contrast stain R putamen and moderate R sylvian fissure, SAH + contrast.    CT head R sylvian fissure SAH. Increased contrast vs hemorrhage R suprasellar cistern following ICA concerned  for increased SAH. R sulci effacement.   Carotid Doppler  R ICA stent patent  MRI  Patchy R MCA infarcts. SAH R sylvian fissure. Small IVH.   MRA - Severely motion degraded head MRA. Persistent patency of the revascularized right ICA. No flow limiting proximal stenosis identified.   CT head repeat 5/11 R insular infarct stable. Residual small SAH and small IVH.  CT head repeat 5/13 evolving R MCA infarct, SAH unchanged   CT 5/30 SAH resolved.   2D Echo EF 35-40%. No source of embolus   LDL 90  HgbA1c 5.8  SCDs for VTE prophylaxis  Eliquis (apixaban) daily prior to admission, now on aspirin 81 mg daily and Brilinta (ticagrelor) 90 mg bid. Now SAH resolved on CT and after PEG, will consider resume AC  Therapy  recommendations:  CIR - > pt w/o family support at home. Will need SNF  Disposition:  pending  Acute hypoxemic respiratory failure, resolved Likely Aspiration PNA, treated  Extubated w/ CCM signing off 5/8  Vomiting x 5 with possible aspiration requiring NRB 5/10. CCM reconsulted. Transferred to ICU and Intubated  Extubated 5/11 w/o difficulties  Unasyn 5/10>>5/17 for 7 days  Leukocytosis - resolved  CXR - slight improvement aeration LLL w/ atx/infiltrate. Cardiomegaly.   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 Hold AC given post IR SAH and need for DAPT post stent placement. PEG Monday. Repeat head CT after PEG (Monday or Tuesday) morning and consider restarting eliquis. . Bradycardia resolved off sedation . On metoprolol 12.5 bid w/ tachycardia past 24h - increase metoprolol to 25 bid  . Consider AC since [facility] has resolvee and PEG has placed  Hypertension  Home meds:  Hydralazine 50 tid, metoprolol 25 bid  On Hydralazine 25 mg Q8 and metoprolol 25 bid . SBP 120s, on the low end  . Long-term BP goal normotensive  Hyperlipidemia  Home meds:  No statin  LDL 90, goal < 70  May consider statin again ->pt adamantly has refused statins in the past  AKI on CKD   Cre 1.29->1.26->1.24->1.29->1.35->1.23   Renal US R w/ increased echogenicity. 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 Secondary to  stroke . SLP difficulty d/t pt participation. . D1 puree w/ honey thick liquids -> pt not swallowing and eating enough for daily needs -> Back to continuous feeding -> restarted TFs t a lower rate due to vomiting and persistent diarrhge Sunday 5/30 . IVF @ 50 cc/hr . Encourage po intake . S/p PEG 08/04/19 . Speech and dietician on board  Other Stroke Risk Factors  Advanced age  Coronary artery disease  Chronic systolic Congestive heart failure  Other Active Problems  GERD on PPI   BPH on flomax PTA  Prostate cancer   Hypokalemia - resolved   LBBB. Appears old. EKG neg ischemia. Trops 27->32  (Likely strain related)  Small blood clots in loose stool with recurrence. (No indication for colonoscopy) Hgb 11.7->9.6->9.7->9.6 Continue to monitor.    Loose stool on imodium scheduled  Thrombocytosis - resolved  Hospital Day #26   [name], MD PhD Stroke Neurology 08/05/2019 1:09 AM      To contact Stroke Continuity provider, please refer to [email or web address] After hours, contact General Neurology

## 2019-08-04 NOTE — Anesthesia Postprocedure Evaluation (Signed)
Anesthesia Post Note  Patient: Stephen Copeland  Procedure(s) Performed: ESOPHAGOGASTRODUODENOSCOPY (EGD) WITH PROPOFOL (N/A ) PERCUTANEOUS ENDOSCOPIC GASTROSTOMY (PEG) PLACEMENT (N/A )     Patient location during evaluation: PACU Anesthesia Type: MAC Level of consciousness: awake and alert Pain management: pain level controlled Vital Signs Assessment: post-procedure vital signs reviewed and stable Respiratory status: spontaneous breathing, nonlabored ventilation and respiratory function stable Cardiovascular status: blood pressure returned to baseline and stable Postop Assessment: no apparent nausea or vomiting Anesthetic complications: no    Last Vitals:  Vitals:   08/04/19 1112 08/04/19 1122  BP: (!) 160/61 (!) 173/99  Pulse: (!) 102 96  Resp: 15 13  Temp: 37.1 C   SpO2: 95% 97%    Last Pain:  Vitals:   08/04/19 1122  TempSrc:   PainSc: [place]

## 2019-08-04 NOTE — Op Note (Signed)
   Procedure Note  Date: 08/04/2019  Procedure: esophagogastroduodenoscopy (EGD) and percutaneous endoscopic gastrostomy (PEG) tube placement  Pre-op diagnosis: dysphagia, malnutrition Post-op diagnosis: same  Indication and clinical history: 38M with dysphagia s/p stroke.  Surgeon: Jesusita Oka, MD Assistant: Maxwell Caul, [place]  Anesthesia: MAC  Findings: normal EGD . Specimen: none . EBL: <5cc . Drains/Implants: PEG tube, 2cm at the skin   Disposition: ICU/PACU  Description of Procedure: The patient was positioned semi-recumbent. Time-out was performed verifying correct patient, procedure, signature of informed consent, and pre-operative antibiotics as indicated. MAC induction was uneventful and a bite block was placed into the oropharynx. The endoscope was inserted into the oropharynx and advanced down the esophagus into the stomach and into the duodenum. The visualized esophagus and duodenum were unremarkable. The endoscope was retracted back into the stomach and the stomach was insufflated. The stomach was inspected and was also normal. Transillumination was performed. The light was visible on the external skin and dimpling of the stomach was noted endoscopically with manual pressure. The abdomen was prepped and draped in the usual sterile fashion. Transillumination and dimpling were repeated and local anesthetic was infiltrated to make a skin wheal at the site of transillumination. The needle was inserted perpendicularly to the skin and the tip of the needle was visualized endoscopically. As the needle was retracted, the tract was also anesthetized. A skin nick was made at the site of the wheal and an introducer needle and sheath were inserted. The needle was removed and guidewire inserted. The guidewire was grasped by an endoscopic snare and the snare, guidewire, and endoscope retracted out of the oropharynx. The PEG tube was secured to the guidewire and retracted through the mouth and  esophagus into the stomach. The PEG tube was secured with a bolster and was visualized endoscopically to spin freely circumferentially and also be without gaps between the internal bumper and the stomach wall. There was no evidence of bleeding. The PEG bolster was secured at 2cm at the skin and there were no gaps between the bolster and the abdominal wall. The stomach was desufflated endoscopically and the endoscope removed. The bite block was also removed. The patient tolerated the procedure well and there were no complications.   The patient may have water and medications administered via the PEG tube beginning immediately and tube feeds may be initiated four hours post-procedure.    Jesusita Oka, MD General and [facility]

## 2019-08-04 NOTE — Significant Event (Addendum)
Rapid Response Event Note  Overview: Time Called: 3414 Arrival Time: 1400 Event Type: Cardiac Pt had tachycardia with a wide complex QRS, HR up to 109.   Initial Focused Assessment: Pt lying in bed, awake. Pt oriented to person & place. Lung sounds are clear after having pt deep breath and cough. Pt using yankauer to remove secretions. Abdomen is soft. Pt had PEG tube placement today. No adventitious heart sounds heard. Pt denies pain.   VS: 98.19F oral, BP 175/95, HR 131, RR 12, SpO2 97% on 2LNC  Interventions: -EKG- given to Dr. Erlinda Hong for review, appears to have no acute change from previous EKG -CBG 96 -CXR -BMP, CBC  Plan of Care (if not transferred): -Follow-up with provider regarding results of tests ordered -Telemetry monitoring -Evaluate VS per order and with rhythm change -Encourage pulmonary hygiene: oral care, cough and deep breathing, repositioning, HOB >30 degrees, oral suction at bedside  Event Summary: Name of Physician Notified: Dr. Erlinda Hong at 1400 Outcome: Stayed in room and stabalized Event End Time: [place]

## 2019-08-04 NOTE — Transfer of Care (Signed)
Immediate Anesthesia Transfer of Care Note  Patient: Stephen Copeland  Procedure(s) Performed: ESOPHAGOGASTRODUODENOSCOPY (EGD) WITH PROPOFOL (N/A ) PERCUTANEOUS ENDOSCOPIC GASTROSTOMY (PEG) PLACEMENT (N/A )  Patient Location: PACU and Endoscopy Unit  Anesthesia Type:MAC  Level of Consciousness: awake and alert   Airway & Oxygen Therapy: Patient Spontanous Breathing and Patient connected to nasal cannula oxygen  Post-op Assessment: Report given to RN and Post -op Vital signs reviewed and stable  Post vital signs: Reviewed and stable  Last Vitals:  Vitals Value Taken Time  BP 160/61 08/04/19 1112  Temp    Pulse 102 08/04/19 1112  Resp 15 08/04/19 1112  SpO2 95 % 08/04/19 1112  Vitals shown include unvalidated device data.  Last Pain:  Vitals:   08/04/19 0938  TempSrc: Oral  PainSc: 9       Patients Stated Pain Goal: 2 (64/15/83 0940)  Complications: No apparent anesthesia complications

## 2019-08-04 NOTE — Progress Notes (Signed)
Some ooznig from around the patient's PEG tube noted.  A 2-0 nylon stitch was placed as a simple interrupted suture.  Hemostasis was achieved.  g-tube remained in good position and ready for use for meds and tube feeds at 1500.  Stephen Copeland 1:30 PM 08/04/2019

## 2019-08-04 NOTE — Anesthesia Procedure Notes (Signed)
Procedure Name: MAC Date/Time: 08/04/2019 10:36 AM Performed by: Inda Coke, CRNA Pre-anesthesia Checklist: Patient identified, Emergency Drugs available, Suction available, Timeout performed and Patient being monitored Patient Re-evaluated:Patient Re-evaluated prior to induction Oxygen Delivery Method: Nasal cannula Induction Type: IV induction Dental Injury: Teeth and Oropharynx as per pre-operative assessment

## 2019-08-05 DIAGNOSIS — R0602 Shortness of breath: Secondary | ICD-10-CM

## 2019-08-05 DIAGNOSIS — J988 Other specified respiratory disorders: Secondary | ICD-10-CM

## 2019-08-05 LAB — CBC
HCT: 30.7 % — ABNORMAL LOW (ref 39.0–52.0)
Hemoglobin: 9.7 g/dL — ABNORMAL LOW (ref 13.0–17.0)
MCH: 30.1 pg (ref 26.0–34.0)
MCHC: 31.6 g/dL (ref 30.0–36.0)
MCV: 95.3 fL (ref 80.0–100.0)
Platelets: 357 10*3/uL (ref 150–400)
RBC: 3.22 MIL/uL — ABNORMAL LOW (ref 4.22–5.81)
RDW: 14.4 % (ref 11.5–15.5)
WBC: 8.4 10*3/uL (ref 4.0–10.5)
nRBC: 0 % (ref 0.0–0.2)

## 2019-08-05 LAB — GLUCOSE, CAPILLARY
Glucose-Capillary: 120 mg/dL — ABNORMAL HIGH (ref 70–99)
Glucose-Capillary: 124 mg/dL — ABNORMAL HIGH (ref 70–99)
Glucose-Capillary: 123 mg/dL — ABNORMAL HIGH (ref 70–99)
Glucose-Capillary: 113 mg/dL — ABNORMAL HIGH (ref 70–99)
Glucose-Capillary: 138 mg/dL — ABNORMAL HIGH (ref 70–99)

## 2019-08-05 LAB — BASIC METABOLIC PANEL
Anion gap: 8 (ref 5–15)
BUN: 21 mg/dL (ref 8–23)
CO2: 24 mmol/L (ref 22–32)
Calcium: 8.2 mg/dL — ABNORMAL LOW (ref 8.9–10.3)
Chloride: 108 mmol/L (ref 98–111)
Creatinine, Ser: 1.27 mg/dL — ABNORMAL HIGH (ref 0.61–1.24)
GFR calc Af Amer: >60 mL/min (ref >60)
GFR calc non Af Amer: 54 mL/min — ABNORMAL LOW (ref >60)
Glucose, Bld: 138 mg/dL — ABNORMAL HIGH (ref 70–99)
Potassium: 3.8 mmol/L (ref 3.5–5.1)
Sodium: 140 mmol/L (ref 135–145)

## 2019-08-05 MED ORDER — APIXABAN 5 MG PO TABS
5.0000 mg | ORAL_TABLET | Freq: Two times a day (BID) | ORAL | Status: DC
  Administered 2019-08-05 – 2019-08-12 (×12): 5 mg
  Filled 2019-08-05 (×12): qty 1

## 2019-08-05 MED ORDER — FUROSEMIDE 10 MG/ML IJ SOLN
40.0000 mg | Freq: Once | INTRAMUSCULAR | Status: AC
  Administered 2019-08-05: 40 mg via INTRAVENOUS
  Filled 2019-08-05: qty 4

## 2019-08-05 MED ORDER — APIXABAN 5 MG PO TABS
5.0000 mg | ORAL_TABLET | Freq: Two times a day (BID) | ORAL | Status: DC
  Filled 2019-08-05: qty 1

## 2019-08-05 MED ORDER — FREE WATER
150.0000 mL | Freq: Four times a day (QID) | Status: DC
  Administered 2019-08-05 – 2019-08-07 (×8): 150 mL

## 2019-08-05 MED ORDER — JEVITY 1.5 CAL/FIBER PO LIQD
1000.0000 mL | ORAL | Status: DC
  Administered 2019-08-05: 1000 mL
  Filled 2019-08-05 (×2): qty 1000

## 2019-08-05 MED ORDER — FREE WATER
150.0000 mL | Status: DC
  Administered 2019-08-05: 150 mL

## 2019-08-05 NOTE — Progress Notes (Signed)
STROKE TEAM PROGRESS NOTE   INTERVAL HISTORY: Wife at bedside. Pt lying in bed, mild SOB with frequent coughing. He still on IVF @ 50 and now TF @ 60 with FW 150cc Q4h. I feel he has some fluid overload, will d/c IVF, decreased FW and continue TF, will give one dose of lasix. Pending SNF.    Vitals:   08/04/19 2358 08/05/19 0300 08/05/19 0748 08/05/19 1149  BP: 134/72 (!) 158/86 (!) 144/79 138/71  Pulse: 66 75 77 82  Resp: (!) 21 18 19 20   Temp: 97.8 F (36.6 C) 98.1 F (36.7 C) 98.1 F (36.7 C) 98.6 F (37 C)  TempSrc: Oral Oral Oral Oral  SpO2: 98% 99% 98% 97%  Weight:      Height:       CBC:  Recent Labs  Lab 08/04/19 1424 08/05/19 0609  WBC 11.6* 8.4  HGB 11.4* 9.7*  HCT 35.8* 30.7*  MCV 94.2 95.3  PLT 412* 440   Basic Metabolic Panel:  Recent Labs  Lab 07/31/19 1159 08/01/19 0320 08/04/19 1424 08/05/19 0609  NA 138   < > 142 140  K 3.1*   < > 3.8 3.8  CL 108   < > 105 108  CO2 23   < > 23 24  GLUCOSE 150*   < > 103* 138*  BUN 19   < > 21 21  CREATININE 1.26*   < > 1.23 1.27*  CALCIUM 8.0*   < > 8.8* 8.2*  MG 1.4*  --   --   --    < > = values in this interval not displayed.    IMAGING past 24 hours DG CHEST PORT 1 VIEW  Result Date: 08/04/2019 CLINICAL DATA:  Shortness of breath. EXAM: PORTABLE CHEST 1 VIEW COMPARISON:  Aug 02, 2019. FINDINGS: Stable cardiomegaly. No pneumothorax or pleural effusion is noted. Stable elevated right hemidiaphragm is noted mild right basilar subsegmental atelectasis. Left lung is clear. Bony thorax is unremarkable. IMPRESSION: Stable elevated right hemidiaphragm with mild right basilar subsegmental atelectasis. Electronically Signed   By: Marijo Conception M.D.   On: 08/04/2019 15:38     PHYSICAL EXAM   Temp:  [97.7 F (36.5 C)-99 F (37.2 C)] 98.6 F (37 C) (06/02 1149) Pulse Rate:  [66-84] 82 (06/02 1149) Resp:  [13-21] 20 (06/02 1149) BP: (134-158)/(66-87) 138/71 (06/02 1149) SpO2:  [96 %-99 %] 97 % (06/02  1149)  General - thin, well developed, in mild SOB.  Ophthalmologic - fundi not visualized due to noncooperation.  Cardiovascular - Regular rhythm and rate, tachycardia resolved.  Neuro - alert and awake, oriented x3, he follows simple commands, talkative without aphasia but with mild dysarthria, able to name and repeat, but psychomotor slowing, blinks to threat bilaterally, extraocular muscles intact, pupils appear equally round and reactive to light, left lower facial weakness however he reports sensation is intact in his face, tongue midline, sensation intact, not ataxic or dysmetric. Gait not tested.   ASSESSMENT/PLAN Mr. Aneudy Champlain is a 79 y.o. male with history of Afib, systolic CHF, HLD, CAD, HTN, prostate cancer presenting with left sided weakness, facial droop, dysarthria.   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 embolic secondary to known AF even on Eliquis  Code Stroke CT head evolving R MCA territory infarct (insula, cerebral cortex).  hyperdense R M2 +/- M3. ASPECTS 7    CTA head & neck +LVO. Probable ruptured plaque R ICA bifurcation w/ near occlusive stenosis, possible superimposed dissection. Distal reconstitution R M1 from collaterals but w/ distal embolic occlusion proximal R M2/3 superior division branch. L ICA bifurcation and siphon atherosclerosis. B VA origins 50% stenoses. R subclavian artery origin 60% stenosis.   CT perfusion 19cc core infarct R insula and posterior frontal. 33cc penumbra.   Cerebral angio TICI3 revascularization occluded middle trifurcation R MCA branch, revascularization R ICA occlusion w/ angioplasty and stenting.   Post IR CT contrast stain R putamen and moderate R sylvian fissure, SAH + contrast.    CT head R sylvian fissure SAH. Increased contrast vs hemorrhage R suprasellar cistern following  ICA concerned for increased SAH. R sulci effacement.   Carotid Doppler  R ICA stent patent  MRI  Patchy R MCA infarcts. SAH R sylvian fissure. Small IVH.   MRA - Severely motion degraded head MRA. Persistent patency of the revascularized right ICA. No flow limiting proximal stenosis identified.   CT head repeat 5/11 R insular infarct stable. Residual small SAH and small IVH.  CT head repeat 5/13 evolving R MCA infarct, SAH unchanged   CT 5/30 SAH resolved.   2D Echo EF 35-40%. No source of embolus   LDL 90  HgbA1c 5.8  SCDs for VTE prophylaxis  Eliquis (apixaban) daily prior to admission, now on aspirin 81 mg daily and Brilinta (ticagrelor) 90 mg bid. Now SAH resolved on CT and after PEG, will consider resume eliquis. Discussed with Dr. Estanislado Pandy, will d/c ASA and keep brilinta for total 3 months and then switch to plavix.    Therapy recommendations:  CIR - > pt w/o family support at home. Will need SNF  Disposition:  SNF  Hope for d/c tomorrow  Acute hypoxemic respiratory failure, resolved Likely Aspiration PNA, treated  Extubated w/ CCM signing off 5/8  Vomiting x 5 with possible aspiration requiring NRB 5/10. CCM reconsulted. Transferred to ICU and Intubated  Extubated 5/11 w/o difficulties  Unasyn 5/10>>5/17 for 7 days  Leukocytosis - resolved  CXR - slight improvement aeration LLL w/ atx/infiltrate. Cardiomegaly.   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 Hold AC given post IR SAH and need for DAPT post stent placement. PEG Monday. Repeat head CT after PEG (Monday or Tuesday) morning and consider restarting eliquis. . Bradycardia resolved  off sedation . On metoprolol 12.5 bid w/ tachycardia past 24h - increase metoprolol to 25 bid  . Resume eliquis since [facility] has resolved and PEG has placed   Hypertension  Home meds:  Hydralazine 50 tid, metoprolol 25 bid  On Hydralazine 25 mg Q8 and metoprolol 25 bid . SBP 120s, on the low end  . Long-term BP goal normotensive  Hyperlipidemia  Home meds:  No statin  LDL 90, goal < 70  May consider statin again ->pt adamantly has refused statins in the past  AKI on CKD   Cre 1.29->1.26->1.24->1.29->1.35->1.23->1.27   Renal US R w/ increased echogenicity. 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 Secondary to stroke . SLP difficulty d/t pt participation. . D1 puree w/ honey thick liquids -> pt not swallowing and eating enough for daily needs -> Back to continuous feeding -> restarted TFs t a lower rate due to vomiting and persistent diarrhge Sunday 5/30 . S/p PEG 08/04/19, on TF @ 60 . Remove sutures 6/8 . Speech and dietician on board  Other Stroke Risk Factors  Advanced age  Coronary artery disease  Chronic systolic Congestive heart failure  Other Active Problems  GERD on PPI   BPH on flomax PTA  Prostate cancer   Hypokalemia - resolved   LBBB. Appears old. EKG neg ischemia. Trops 27->32  (Likely strain related)  Small blood clots in loose stool with recurrence. (No indication for colonoscopy) Hgb 11.7->9.6->9.7->9.6->9.7 Continue to monitor.    Loose stool on imodium scheduled  Thrombocytosis - resolved  Hospital Day #27   [name], MD PhD Stroke Neurology 08/05/2019 2:28 PM      To  contact Stroke Continuity provider, please refer to [email or web address] After hours, contact General Neurology

## 2019-08-05 NOTE — Progress Notes (Signed)
Nutrition Follow-up  DOCUMENTATION CODES:   Not applicable  INTERVENTION:  Continue via PEG: Jevity1.5 @ 60 ml/hr (1468m per day) 30 ml Prostat daily 1552mfree water Q4H (or per MD/PA)  Provides: 2260 kcal, 106grams protein, and 1094 ml free water(199487motal free water with flushes).   NUTRITION DIAGNOSIS:   Inadequate oral intake related to inability to eat as evidenced by NPO status.  Ongoing.  GOAL:   Patient will meet greater than or equal to 90% of their needs  Met with TF.   MONITOR:   TF tolerance, Labs  REASON FOR ASSESSMENT:   Consult Calorie Count  ASSESSMENT:   Pt with PMH of CHF, CAD, cardiomyopathy, hiatal hernia, HLD, and HTN now admitted with R CVA s/p thrombectomy and carotid stent.  5/6 admitted s/p IR, remained intubated post-op 5/7 extubated; cortrak placed  5/10 N/V with normal KUB; intubated 5/11 extubated, cortrak removed 5/12 failed swallow eval; cortrak replaced as tube was inadvertently pulled with extubation 5/17 diet upgraded to dysphagia 1 with honey thick liquids 5/18 TF switched to nocturnal 5/20 TF returned to continuous due to poor po intake 6/1 s/p EGD and PEG palcement  Discussed pt with RN. Pt tolerating TF. Pt unable to answer RD questions at this time.  Labs reviewed. CBGs 120-124-123 Medications reviewed and include: Novolog, imodium  TF: 57m62mo-stat daily, Jevity 1.5 cal @ 60ml65m Diet Order:   Diet Order            Diet NPO time specified  Diet effective midnight              EDUCATION NEEDS:   No education needs have been identified at this time  Skin:  Skin Assessment: Skin Integrity Issues: Skin Integrity Issues:: Incisions, Other (Comment) Incisions: R groin Other: MASD buttocks  Last BM:  6/1  Height:   Ht Readings from Last 1 Encounters:  08/04/19 _0  (1.88 m)    Weight:   Wt Readings from Last 1 Encounters:  08/04/19 83.9 kg    Ideal Body Weight:  86.3 kg  BMI:   Body mass index is 23.75 kg/m.  Estimated Nutritional Needs:   Kcal:  2100-2300  Protein:  100-115 grams  Fluid:  >2 L/day   AmandLarkin Ina RD, LDN RD pager number and weekend/on-call pager number located in [email or web address]

## 2019-08-05 NOTE — Progress Notes (Signed)
Occupational Therapy Treatment Patient Details Name: Stephen Copeland MRN: [id number] DOB: 18-Sep-1940 Today's Date: 08/05/2019    History of present illness 79 y.o. male admitted on 07/09/19 for L sided weakness, facial droop, and slurred speech.  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  He did not get any tPA as he was outside of the window.  Pt intubated 5/6 for procedure and extubated in the AM of 07/10/19.  Pt with other significant PMH of sinus brady, scoliosis, prostate CA s/p surgery, HTN, dilated cardiomyopathy, CAD, CHF.  Patient re-intubated 5/10 with inability to clear secretions.. Extubated 5/11. PEG placed 6/1.   OT comments  Pt requiring encouragement to participate in therapy session today, continuing to have limitations due to impaired cognition and c/o dizziness with being upright. Pt requiring two person assist for completion of bed mobility, overall requiring minA for balance EOB though pt with strong desire to return to supine, reports due to dizziness. Best attempts provided to educate pt on importance of being upright to help in reducing level of dizziness with mobility tasks. Pt did tolerate HOB to almost full upright end of session. SpO2 >95% and HR in the 80s with activity. Continue to recommend SNF at time of discharge.   Follow Up Recommendations  SNF;Supervision/Assistance - 24 hour    Equipment Recommendations  Other (comment)(TBD)          Precautions / Restrictions Precautions Precautions: Fall Restrictions Weight Bearing Restrictions: No       Mobility Bed Mobility Overal bed mobility: Needs Assistance Bed Mobility: Sit to Supine;Supine to Sit     Supine to sit: +2 for physical assistance;Max assist;Mod assist Sit to supine: Max assist;+2 for physical assistance   General bed mobility comments: cues for direction and assist with legs off bed and up via L elbow with extra assist given with his R UE.  Transfers                  General transfer comment: pt deferred working on standing or getting OOB>    Balance Overall balance assessment: Needs assistance Sitting-balance support: Feet supported Sitting balance-Leahy Scale: Poor Sitting balance - Comments: pt needing minimal steadying assist, pt reporting over and over that he was dizzy and wanting to lay down from the moment he was sitting upright.                                   ADL either performed or assessed with clinical judgement   ADL Overall ADL's : Needs assistance/impaired     Grooming: Set up;Bed level Grooming Details (indicate cue type and reason): using cup for clearing secretions                              Functional mobility during ADLs: Maximal assistance;+2 for physical assistance General ADL Comments: pt sat EOB for short period this session with encouragement, continues to have c/o dizziness while upright. attempted to eduate on need to allow body to acclimate to being upright to reduce dizziness                        Cognition Arousal/Alertness: Awake/alert Behavior During Therapy: Flat affect Overall Cognitive Status: Impaired/Different from baseline Area of Impairment: Attention;Memory;Following commands;Safety/judgement;Problem solving  Current Attention Level: Sustained Memory: Decreased short-term memory Following Commands: Follows one step commands with increased time;Follows one step commands inconsistently Safety/Judgement: Decreased awareness of safety;Decreased awareness of deficits Awareness: Intellectual Problem Solving: Decreased initiation;Requires verbal cues;Requires tactile cues General Comments: pt attempting to use urinal over top of blanket, unaware of doing so but resistive to OT attempts to correct (pt also with male purewick on at this time)        Exercises Exercises: Other exercises Other Exercises Other Exercises: warm up  ROM exercise to bil LE's prior to moving.   Shoulder Instructions       General Comments Pt was frequently spitting up thick mucous through out the session.  Therapist tried to encourage with talk on the improtances of moving given the level of secretions and his general and L sided weakness.  sats 99% on 3L [place] and HR in the mid 80's    Pertinent Vitals/ Pain       Pain Assessment: Faces Faces Pain Scale: Hurts a little bit Pain Location: generalized, back, abdomen Pain Intervention(s): Monitored during session;Repositioned  Home Living                                          Prior Functioning/Environment              Frequency  Min 2X/week        Progress Toward Goals  OT Goals(current goals can now be found in the care plan section)  Progress towards OT goals: Progressing toward goals  Acute Rehab OT Goals Patient Stated Goal: less pain  OT Goal Formulation: With patient Time For Goal Achievement: 08/19/19 Potential to Achieve Goals: Fair ADL Goals Pt Will Perform Grooming: with set-up;with supervision;sitting Pt Will Perform Lower Body Dressing: with min assist;sit to/from stand Pt Will Transfer to Toilet: stand pivot transfer;bedside commode;with mod assist Pt Will Perform Toileting - Clothing Manipulation and hygiene: with mod assist;sitting/lateral leans Additional ADL Goal #1: Pt will sustain attention to simple ADL with Min cues  Plan Discharge plan remains appropriate;Frequency remains appropriate    Co-evaluation    PT/OT/SLP Co-Evaluation/Treatment: Yes Reason for Co-Treatment: Complexity of the patient's impairments (multi-system involvement);For patient/therapist safety;To address functional/ADL transfers;Necessary to address cognition/behavior during functional activity   OT goals addressed during session: ADL's and self-care      AM-PAC OT "6 Clicks" Daily Activity     Outcome Measure   Help from another person eating  meals?: A Lot Help from another person taking care of personal grooming?: A Lot Help from another person toileting, which includes using toliet, bedpan, or urinal?: Total Help from another person bathing (including washing, rinsing, drying)?: A Lot Help from another person to put on and taking off regular upper body clothing?: Total Help from another person to put on and taking off regular lower body clothing?: Total 6 Click Score: 9    End of Session Equipment Utilized During Treatment: Oxygen  OT Visit Diagnosis: Unsteadiness on feet (R26.81);Other abnormalities of gait and mobility (R26.89);Muscle weakness (generalized) (M62.81);Pain;Other symptoms and signs involving cognitive function Pain - part of body: (generalized )   Activity Tolerance Patient limited by fatigue   Patient Left in bed;with call bell/phone within reach;with bed alarm set;with family/visitor present   Nurse Communication Mobility status        Time: 1445-1516 OT Time Calculation (min): 31 min  Charges: OT General  Charges $OT Visit: 1 Visit OT Treatments $Self Care/Home Management : 8-22 mins  Lou Cal, OT Acute Rehabilitation Services Pager [phone] Office [phone]    Raymondo Band 08/05/2019, 4:07 PM

## 2019-08-05 NOTE — Progress Notes (Signed)
Physical Therapy Treatment Patient Details Name: Jadyn Barge MRN: [id number] DOB: 1940-08-27 Today's Date: 08/05/2019    History of Present Illness 79 y.o. male admitted on 07/09/19 for L sided weakness, facial droop, and slurred speech.  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  He did not get any tPA as he was outside of the window.  Pt intubated 5/6 for procedure and extubated in the AM of 07/10/19.  Pt with other significant PMH of sinus brady, scoliosis, prostate CA s/p surgery, HTN, dilated cardiomyopathy, CAD, CHF.  Patient re-intubated 5/10 with inability to clear secretions.. Extubated 5/11. PEG placed 6/1.    PT Comments    Pt in bed on arrival.  Pt needed maximal encouragement from therapists and his wife, but still was resistant to sit EOB due to discomfort from the new PEG.  Once at EOB, pt worked briefly on sitting balance, but was focused on dizziness and kept trying to lie down instead of working to sit on his own.   Follow Up Recommendations  SNF     Equipment Recommendations  Rolling walker with 5" wheels;Wheelchair (measurements PT);Wheelchair cushion (measurements PT);3in1 (PT)    Recommendations for Other Services       Precautions / Restrictions Precautions Precautions: Fall Restrictions Weight Bearing Restrictions: No    Mobility  Bed Mobility Overal bed mobility: Needs Assistance Bed Mobility: Sit to Supine;Supine to Sit     Supine to sit: +2 for physical assistance;Max assist;Mod assist Sit to supine: Max assist;+2 for physical assistance   General bed mobility comments: cues for direction and assist with legs off bed and up via L elbow with extra assist given with his R UE.  Transfers                 General transfer comment: pt deferred workin gon standing or getting OOB>  Ambulation/Gait             General Gait Details: unable at this time.    Stairs             Wheelchair Mobility     Modified Rankin (Stroke Patients Only) Modified Rankin (Stroke Patients Only) Pre-Morbid Rankin Score: No symptoms Modified Rankin: Severe disability     Balance Overall balance assessment: Needs assistance Sitting-balance support: Feet supported Sitting balance-Leahy Scale: Poor Sitting balance - Comments: pt needing minimal steadying assist, pt reporting over and over that he was dizzy and wanting to lay down from the moment he was sitting upright.                                    Cognition Arousal/Alertness: Awake/alert Behavior During Therapy: Flat affect Overall Cognitive Status: Impaired/Different from baseline                     Current Attention Level: Sustained Memory: Decreased short-term memory Following Commands: Follows one step commands with increased time;Follows one step commands inconsistently Safety/Judgement: Decreased awareness of safety;Decreased awareness of deficits Awareness: Intellectual Problem Solving: Decreased initiation;Requires verbal cues;Requires tactile cues        Exercises Other Exercises Other Exercises: warm up ROM exercise to bil LE's prior to moving.    General Comments General comments (skin integrity, edema, etc.): Pt was frequently spitting up thick mucous through out the session.  Therapist tried to encourage with talk on the improtances of moving given the level of  secretions and his general and L sided weakness.  sats 99% on 3L [place] and HR in the mid 80's      Pertinent Vitals/Pain Pain Assessment: Faces Faces Pain Scale: No hurt Pain Intervention(s): Monitored during session    Home Living                      Prior Function            PT Goals (current goals can now be found in the care plan section) Acute Rehab PT Goals Patient Stated Goal: less pain  PT Goal Formulation: With patient Time For Goal Achievement: 08/19/19 Potential to Achieve Goals: Fair Progress towards PT goals:  Not progressing toward goals - comment(resistant to moving today)    Frequency    Min 3X/week      PT Plan Current plan remains appropriate    Co-evaluation              AM-PAC PT "6 Clicks" Mobility   Outcome Measure  Help needed turning from your back to your side while in a flat bed without using bedrails?: A Lot Help needed moving from lying on your back to sitting on the side of a flat bed without using bedrails?: A Lot Help needed moving to and from a bed to a chair (including a wheelchair)?: Total Help needed standing up from a chair using your arms (e.g., wheelchair or bedside chair)?: Total Help needed to walk in hospital room?: Total Help needed climbing 3-5 steps with a railing? : Total 6 Click Score: 8    End of Session   Activity Tolerance: Patient limited by pain;Other (comment)(resistant) Patient left: in bed;with call bell/phone within reach;with bed alarm set;with family/visitor present Nurse Communication: Mobility status PT Visit Diagnosis: Unsteadiness on feet (R26.81);Muscle weakness (generalized) (M62.81);Other symptoms and signs involving the nervous system (R29.898);Hemiplegia and hemiparesis Hemiplegia - Right/Left: Left Hemiplegia - dominant/non-dominant: Non-dominant Hemiplegia - caused by: Cerebral infarction     Time: 1445-1516 PT Time Calculation (min) (ACUTE ONLY): 31 min  Charges:  $Therapeutic Activity: 8-22 mins                     08/05/2019  Ginger Carne., PT Acute Rehabilitation Services [phone]  (pager) [phone]  (office)   Tessie Fass Amel Kitch 08/05/2019, 3:45 PM

## 2019-08-05 NOTE — Progress Notes (Signed)
SLP Cancellation Note  Patient Details Name: Stephen Copeland MRN: [id number] DOB: 1940-05-17   Cancelled treatment:       Reason Eval/Treat Not Completed: Patient declined, no reason specified(Pt was approached for treatment. He was awake up SLP's entry but stated that he is currently napping and therefore will be unable to participate at this time. Despite SLP's offering of an abbreviated session and education regarding the purpose of treatment, he reiterated that he was "currently napping" and therefore could not participate. SLP will follow up on subsequent date.)  Dewaun Kinzler I. Hardin Negus, [place], [place] Office number [phone] Pager [place] 08/05/2019, 3:40 PM

## 2019-08-05 NOTE — Progress Notes (Signed)
Patient ID: Stephen Copeland, male   DOB: 02-20-1941, 79 y.o.   MRN: [id number]    1 Day Post-Op  Subjective: Mild abdominal pain around PEG tube site.  Tolerating TFs with no issues.  Bleeding from around PEG has stopped.    ROS: negative  Objective: Vital signs in last 24 hours: Temp:  [97.7 F (36.5 C)-99 F (37.2 C)] 98.1 F (36.7 C) (06/02 0748) Pulse Rate:  [66-131] 77 (06/02 0748) Resp:  [12-21] 19 (06/02 0748) BP: (134-175)/(61-99) 144/79 (06/02 0748) SpO2:  [95 %-99 %] 98 % (06/02 0748) Last BM Date: 08/04/19  Intake/Output from previous day: 06/01 0701 - 06/02 0700 In: 150 [I.V.:150] Out: 701 [Urine:700; Blood:1] Intake/Output this shift: Total I/O In: -  Out: 225 [Urine:225]  PE: Abd: soft, PEG tube site is clean and intact.  TFs running with no issues.  Appropriately tender around site.  Lab Results:  Recent Labs    08/04/19 1424 08/05/19 0609  WBC 11.6* 8.4  HGB 11.4* 9.7*  HCT 35.8* 30.7*  PLT 412* 357   BMET Recent Labs    08/04/19 1424 08/05/19 0609  NA 142 140  K 3.8 3.8  CL 105 108  CO2 23 24  GLUCOSE 103* 138*  BUN 21 21  CREATININE 1.23 1.27*  CALCIUM 8.8* 8.2*   PT/INR No results for input(s): LABPROT, INR in the last 72 hours. CMP     Component Value Date/Time   NA 140 08/05/2019 0609   NA 144 04/14/2019 1326   K 3.8 08/05/2019 0609   CL 108 08/05/2019 0609   CO2 24 08/05/2019 0609   GLUCOSE 138 (H) 08/05/2019 0609   BUN 21 08/05/2019 0609   BUN 28 (H) 04/14/2019 1326   CREATININE 1.27 (H) 08/05/2019 0609   CREATININE 1.15 01/30/2016 0949   CALCIUM 8.2 (L) 08/05/2019 0609   PROT 5.3 (L) 08/03/2019 1143   PROT 6.9 04/14/2019 1326   ALBUMIN 1.8 (L) 08/03/2019 1143   ALBUMIN 4.1 04/14/2019 1326   AST 62 (H) 08/03/2019 1143   ALT 72 (H) 08/03/2019 1143   ALKPHOS 75 08/03/2019 1143   BILITOT 1.1 08/03/2019 1143   BILITOT 0.8 04/14/2019 1326   GFRNONAA 54 (L) 08/05/2019 0609   GFRAA >60 08/05/2019 0609   Lipase       Component Value Date/Time   LIPASE 39 08/02/2019 1159       Studies/Results: DG CHEST PORT 1 VIEW  Result Date: 08/04/2019 CLINICAL DATA:  Shortness of breath. EXAM: PORTABLE CHEST 1 VIEW COMPARISON:  Aug 02, 2019. FINDINGS: Stable cardiomegaly. No pneumothorax or pleural effusion is noted. Stable elevated right hemidiaphragm is noted mild right basilar subsegmental atelectasis. Left lung is clear. Bony thorax is unremarkable. IMPRESSION: Stable elevated right hemidiaphragm with mild right basilar subsegmental atelectasis. Electronically Signed   By: Marijo Conception M.D.   On: 08/04/2019 15:38    Anti-infectives: Anti-infectives (From admission, onward)   Start     Dose/Rate Route Frequency Ordered Stop   08/04/19 0600  ceFAZolin (ANCEF) IVPB 1 g/50 mL premix  Status:  Discontinued     1 g 100 mL/hr over 30 Minutes Intravenous On call to O.R. 08/02/19 8315 08/02/19 1222   08/04/19 0600  ceFAZolin (ANCEF) IVPB 2g/100 mL premix     2 g 200 mL/hr over 30 Minutes Intravenous On call to O.R. 08/02/19 1222 08/04/19 1240   07/13/19 0700  ampicillin-sulbactam (UNASYN) 1.5 g in sodium chloride 0.9 % 100 mL IVPB  Status:  Discontinued     1.5 g 200 mL/hr over 30 Minutes Intravenous Every 6 hours 07/13/19 0631 07/20/19 0459   07/09/19 2134  ceFAZolin (ANCEF) 2-4 GM/100ML-% IVPB    Note to Pharmacy: Tamsen Snider   : cabinet override      07/09/19 2134 07/10/19 0105       Assessment/Plan CVA with dysphagia, s/p PEG tube placement, 08/04/19 Dr. Bobbye Morton -PEG tube working well -bleeding stopped -DC suture on 6/8 at SNF or here, wherever he is -no further needs.  We will sign off    LOS: 27 days    Henreitta Cea , [facility] 08/05/2019, 10:28 AM Please see Amion for pager number during day hours 7:00am-4:30pm or 7:00am -11:30am on weekends

## 2019-08-06 DIAGNOSIS — R9431 Abnormal electrocardiogram [ECG] [EKG]: Secondary | ICD-10-CM

## 2019-08-06 DIAGNOSIS — I69391 Dysphagia following cerebral infarction: Secondary | ICD-10-CM

## 2019-08-06 DIAGNOSIS — I6529 Occlusion and stenosis of unspecified carotid artery: Secondary | ICD-10-CM

## 2019-08-06 DIAGNOSIS — E7849 Other hyperlipidemia: Secondary | ICD-10-CM

## 2019-08-06 DIAGNOSIS — J690 Pneumonitis due to inhalation of food and vomit: Secondary | ICD-10-CM

## 2019-08-06 DIAGNOSIS — I609 Nontraumatic subarachnoid hemorrhage, unspecified: Secondary | ICD-10-CM

## 2019-08-06 DIAGNOSIS — I5022 Chronic systolic (congestive) heart failure: Secondary | ICD-10-CM

## 2019-08-06 DIAGNOSIS — R197 Diarrhea, unspecified: Secondary | ICD-10-CM

## 2019-08-06 DIAGNOSIS — I454 Nonspecific intraventricular block: Secondary | ICD-10-CM

## 2019-08-06 DIAGNOSIS — K591 Functional diarrhea: Secondary | ICD-10-CM

## 2019-08-06 LAB — CBC
HCT: 31.2 % — ABNORMAL LOW (ref 39.0–52.0)
Hemoglobin: 10 g/dL — ABNORMAL LOW (ref 13.0–17.0)
MCH: 30 pg (ref 26.0–34.0)
MCHC: 32.1 g/dL (ref 30.0–36.0)
MCV: 93.7 fL (ref 80.0–100.0)
Platelets: 389 10*3/uL (ref 150–400)
RBC: 3.33 MIL/uL — ABNORMAL LOW (ref 4.22–5.81)
RDW: 14.3 % (ref 11.5–15.5)
WBC: 11.4 10*3/uL — ABNORMAL HIGH (ref 4.0–10.5)
nRBC: 0 % (ref 0.0–0.2)

## 2019-08-06 LAB — BASIC METABOLIC PANEL
Anion gap: 9 (ref 5–15)
BUN: 23 mg/dL (ref 8–23)
CO2: 28 mmol/L (ref 22–32)
Calcium: 8.2 mg/dL — ABNORMAL LOW (ref 8.9–10.3)
Chloride: 100 mmol/L (ref 98–111)
Creatinine, Ser: 1.29 mg/dL — ABNORMAL HIGH (ref 0.61–1.24)
GFR calc Af Amer: >60 mL/min (ref >60)
GFR calc non Af Amer: 53 mL/min — ABNORMAL LOW (ref >60)
Glucose, Bld: 119 mg/dL — ABNORMAL HIGH (ref 70–99)
Potassium: 3.7 mmol/L (ref 3.5–5.1)
Sodium: 137 mmol/L (ref 135–145)

## 2019-08-06 LAB — GLUCOSE, CAPILLARY
Glucose-Capillary: 106 mg/dL — ABNORMAL HIGH (ref 70–99)
Glucose-Capillary: 115 mg/dL — ABNORMAL HIGH (ref 70–99)
Glucose-Capillary: 136 mg/dL — ABNORMAL HIGH (ref 70–99)
Glucose-Capillary: 140 mg/dL — ABNORMAL HIGH (ref 70–99)
Glucose-Capillary: 142 mg/dL — ABNORMAL HIGH (ref 70–99)
Glucose-Capillary: 144 mg/dL — ABNORMAL HIGH (ref 70–99)

## 2019-08-06 MED ORDER — JEVITY 1.5 CAL/FIBER PO LIQD
1000.0000 mL | ORAL | Status: DC
  Administered 2019-08-06 – 2019-08-17 (×7): 1000 mL
  Filled 2019-08-06 (×22): qty 1000

## 2019-08-06 MED ORDER — JEVITY 1.5 CAL/FIBER PO LIQD
474.0000 mL | Freq: Three times a day (TID) | ORAL | Status: DC
  Filled 2019-08-06 (×2): qty 1000

## 2019-08-06 MED ORDER — JEVITY 1.5 CAL/FIBER PO LIQD
474.0000 mL | Freq: Three times a day (TID) | ORAL | Status: DC
  Administered 2019-08-06: 474 mL
  Filled 2019-08-06 (×4): qty 474

## 2019-08-06 NOTE — Progress Notes (Signed)
Nutrition Follow-up  DOCUMENTATION CODES:   Not applicable  INTERVENTION:  Transition to bolus feeding via PEG: 2 cartons Jevity 1.5 cal (449m) TID 314mPro-stat daily  15043mree water flushes Q6H (per MD)  Tube feeding regimen will provide 2230 kcals, 105 grams protein, 1080 ml free water (1680m38mtal free water with flushes)  NUTRITION DIAGNOSIS:   Inadequate oral intake related to inability to eat as evidenced by NPO status.  Ongoing.  GOAL:   Patient will meet greater than or equal to 90% of their needs  Met with TF.   MONITOR:   TF tolerance, Labs  REASON FOR ASSESSMENT:   Consult Calorie Count  ASSESSMENT:   Pt with PMH of CHF, CAD, cardiomyopathy, hiatal hernia, HLD, and HTN now admitted with R CVA s/p thrombectomy and carotid stent.  5/6 admitted s/p IR, remained intubated post-op 5/7 extubated; cortrak placed  5/10 N/V with normal KUB; intubated 5/11 extubated, cortrak removed 5/12 failed swallow eval; cortrak replaced as tube was inadvertently pulled with extubation 5/17 diet upgraded to dysphagia 1 with honey thick liquids 5/18 TF switched to nocturnal 5/20 TF returned to continuous due to poor po intake 6/1 s/p EGD and PEG placement  Discussed pt with MD regarding transition to bolus feeding. Pt may discharge to SNF today or tomorrow.   Concern for fluid overload, IVF discontinued and free water flushes decreased by MD.   TF via PEG: Jevity 1.5 cal @ 60ml31m 30ml 88mstat daily, 150ml f58mwater Q6H (per MD)  Labs reviewed. CBGs 115-142 Medications reviewed and include: Novolog, Imodium  Diet Order:   Diet Order            Diet NPO time specified  Diet effective midnight              EDUCATION NEEDS:   No education needs have been identified at this time  Skin:  Skin Assessment: Skin Integrity Issues: Skin Integrity Issues:: Incisions, Other (Comment) Incisions: R groin Other: MASD buttocks  Last BM:  6/1  Height:   Ht  Readings from Last 1 Encounters:  08/04/19 _0  (1.88 m)    Weight:   Wt Readings from Last 1 Encounters:  08/04/19 83.9 kg    Ideal Body Weight:  86.3 kg  BMI:  Body mass index is 23.75 kg/m.  Estimated Nutritional Needs:   Kcal:  2100-2300  Protein:  100-115 grams  Fluid:  >2 L/day   Stephen Hallinan Larkin InaD, LDN RD pager number and weekend/on-call pager number located in [email or web address]

## 2019-08-06 NOTE — Progress Notes (Signed)
  Speech Language Pathology Treatment: Cognitive-Linquistic  Patient Details Name: Stephen Copeland MRN: [id number] DOB: [birth year] Today's Date: 08/06/2019 Time: 4383-8184 SLP Time Calculation (min) (ACUTE ONLY): 14 min  Assessment / Plan / Recommendation Clinical Impression  Pt was alert throughout the session and was more interactive than when this SLP saw him on 08/05/19. Pt refused all p.o. intake despite encouragement. Pt required verbal and tactile cues for focused and sustained attention. He was oriented to place and situation but demonstrated impaired intellectual awareness. He achieved 25% accuracy with problem solving related to safety increasing to 50% with cues. He consistently required verbal prompts for reasoning and required additional processing time. The session was terminated prematurely due to pt needing to changed by nurse tech. SLP will continue to follow pt.    HPI HPI: 79 y/o M admitted 5/6 with right sided CVA and remained intubated post thrombectomy and carotid stent. He was outside the TPA window. Intubated 5/6-5/7, then on 5/10 pt had multiple episodes of vomiting brown gastric contents and was reintubted. Pt was seen by SLP in the interim, found to have acute neurogenic dysphagia with signs of aspiration with minimal PO trials. Has Cortrak, on D1/HTL diet.       SLP Plan  Continue with current plan of care       Recommendations  Diet recommendations: Honey-thick liquid;Dysphagia 1 (puree) Liquids provided via: Teaspoon Medication Administration: Via alternative means Supervision: Full supervision/cueing for compensatory strategies;Trained caregiver to feed patient Compensations: Slow rate;Small sips/bites;Minimize environmental distractions Postural Changes and/or Swallow Maneuvers: Seated upright 90 degrees                Oral Care Recommendations: Oral care QID Follow up Recommendations: Skilled Nursing facility SLP Visit Diagnosis: Dysphagia, oropharyngeal  phase (R13.12) Plan: Continue with current plan of care       Stephen Copeland I. Hardin Negus, [place], [place] Office number [phone] Pager [place] 08/06/2019, 11:57 AM

## 2019-08-06 NOTE — Discharge Instructions (Signed)
Remove suture from underneath g-tube site on 08/11/19  ========================================================  Information on my medicine - ELIQUIS (apixaban)  Why was Eliquis prescribed for you? Eliquis was prescribed for you to reduce the risk of a blood clot forming that can cause a stroke if you have a medical condition called atrial fibrillation (a type of irregular heartbeat).  What do You need to know about Eliquis ? Take your Eliquis TWICE DAILY - one tablet in the morning and one tablet in the evening with or without food. If you have difficulty swallowing the tablet whole please discuss with your pharmacist how to take the medication safely.  Take Eliquis exactly as prescribed by your doctor and DO NOT stop taking Eliquis without talking to the doctor who prescribed the medication.  Stopping may increase your risk of developing a stroke.  Refill your prescription before you run out.  After discharge, you should have regular check-up appointments with your healthcare provider that is prescribing your Eliquis.  In the future your dose may need to be changed if your kidney function or weight changes by a significant amount or as you get older.  What do you do if you miss a dose? If you miss a dose, take it as soon as you remember on the same day and resume taking twice daily.  Do not take more than one dose of ELIQUIS at the same time to make up a missed dose.  Important Safety Information A possible side effect of Eliquis is bleeding. You should call your healthcare provider right away if you experience any of the following: ? Bleeding from an injury or your nose that does not stop. ? Unusual colored urine (red or dark brown) or unusual colored stools (red or black). ? Unusual bruising for unknown reasons. ? A serious fall or if you hit your head (even if there is no bleeding).  Some medicines may interact with Eliquis and might increase your risk of bleeding or clotting  while on Eliquis. To help avoid this, consult your healthcare provider or pharmacist prior to using any new prescription or non-prescription medications, including herbals, vitamins, non-steroidal anti-inflammatory drugs (NSAIDs) and supplements.  This website has more information on Eliquis (apixaban): http://[email or web address].com/eliquis/home

## 2019-08-06 NOTE — Progress Notes (Signed)
STROKE TEAM PROGRESS NOTE   INTERVAL HISTORY Pt recline in bed, lethargic, seems to have small amount of yellow vomitus. Just changed from continuous TF this am to bolus feeding, concerning he did not tolerate with bolus feeding, now changing back to continuous feeding.    Vitals:   08/05/19 1939 08/06/19 0035 08/06/19 0300 08/06/19 0914  BP: (!) 147/85 138/76 (!) 151/89 134/87  Pulse: 68 83 84 71  Resp: 18 18 19 20   Temp: 99.1 F (37.3 C) 97.6 F (36.4 C) 98.6 F (37 C) 98.6 F (37 C)  TempSrc: Oral Oral Oral Oral  SpO2: 94% 97% 97% 96%  Weight:      Height:       CBC:  Recent Labs  Lab 08/05/19 0609 08/06/19 0335  WBC 8.4 11.4*  HGB 9.7* 10.0*  HCT 30.7* 31.2*  MCV 95.3 93.7  PLT 357 811   Basic Metabolic Panel:  Recent Labs  Lab 07/31/19 1159 08/01/19 0320 08/05/19 0609 08/06/19 0335  NA 138   < > 140 137  K 3.1*   < > 3.8 3.7  CL 108   < > 108 100  CO2 23   < > 24 28  GLUCOSE 150*   < > 138* 119*  BUN 19   < > 21 23  CREATININE 1.26*   < > 1.27* 1.29*  CALCIUM 8.0*   < > 8.2* 8.2*  MG 1.4*  --   --   --    < > = values in this interval not displayed.    IMAGING past 24 hours No results found.   PHYSICAL EXAM   Temp:  [97.6 F (36.4 C)-99.1 F (37.3 C)] 98.6 F (37 C) (06/03 0914) Pulse Rate:  [68-89] 71 (06/03 0914) Resp:  [18-20] 20 (06/03 0914) BP: (134-155)/(76-89) 134/87 (06/03 0914) SpO2:  [94 %-97 %] 96 % (06/03 0914)  General - thin, well developed, lethargic.  Ophthalmologic - fundi not visualized due to noncooperation.  Cardiovascular - Regular rhythm and rate, tachycardia resolved.  Neuro - lethargic but oriented x2, not to time, he follows simple commands, mild dysarthria, able to name and repeat, but psychomotor slowing, paucity of speech, blinks to threat bilaterally, extraocular muscles intact, pupils appear equally round and reactive to light, left lower facial weakness however he reports sensation is intact in his face, tongue  midline. BUE 4/5. BLE 3/5 proximal and distal. DTR 1+ and no babinski. Sensation intact subjectively, FTN not cooperative today. Gait not tested.   ASSESSMENT/PLAN [name] is a 79 y.o. male with history of Afib, systolic CHF, HLD, CAD, HTN, prostate cancer presenting with left sided weakness, facial droop, dysarthria.   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 embolic secondary to known AF even on Eliquis  Code Stroke CT head evolving R MCA territory infarct (insula, cerebral cortex). hyperdense R M2 +/- M3. ASPECTS 7    CTA head & neck +LVO. Probable ruptured plaque R ICA bifurcation w/ near occlusive stenosis, possible superimposed dissection. Distal reconstitution R M1 from collaterals but w/ distal embolic occlusion proximal R M2/3 superior division branch. L ICA bifurcation and siphon atherosclerosis. B VA origins 50% stenoses. R subclavian artery origin 60% stenosis.   CT perfusion 19cc core infarct R insula and posterior frontal. 33cc penumbra.   Cerebral angio TICI3 revascularization  occluded middle trifurcation R MCA branch, revascularization R ICA occlusion w/ angioplasty and stenting.   Post IR CT contrast stain R putamen and moderate R sylvian fissure, SAH + contrast.    CT head R sylvian fissure SAH. Increased contrast vs hemorrhage R suprasellar cistern following ICA concerned for increased SAH. R sulci effacement.   Carotid Doppler  R ICA stent patent  MRI  Patchy R MCA infarcts. SAH R sylvian fissure. Small IVH.   MRA - Severely motion degraded head MRA. Persistent patency of the revascularized right ICA. No flow limiting proximal stenosis identified.   CT head repeat 5/11 R insular infarct stable. Residual small SAH and small IVH.  CT head repeat 5/13 evolving R MCA infarct, SAH unchanged   CT 5/30 SAH resolved.    2D Echo EF 35-40%. No source of embolus   LDL 90  HgbA1c 5.8  SCDs for VTE prophylaxis  Eliquis (apixaban) daily prior to admission, now on Brilinta (ticagrelor) 90 mg bid and Eliquis (apixaban) daily. Continue brilinta for total 3 months and then switch to plavix and continue Eliquis (per Dr. Estanislado Pandy).    Therapy recommendations:  CIR - > pt w/o family support at home. Will need SNF  Disposition:  [facility] - awaiting bed there - have to check COVID before d/c. He did not get COVID vaccine, requiring a quarantine room in SNF  Acute hypoxemic respiratory failure, resolved Likely Aspiration PNA, treated  Extubated w/ CCM signing off 5/8  Vomiting x 5 with possible aspiration requiring NRB 5/10. CCM reconsulted. Transferred to ICU and Intubated  Extubated 5/11 w/o difficulties  Unasyn 5/10>>5/17 for 7 days  Leukocytosis - resolved  CXR - slight improvement aeration LLL w/ atx/infiltrate. Cardiomegaly.   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 Hold AC given post IR SAH and need for DAPT post stent placement. PEG Monday. Repeat head CT after PEG (Monday or Tuesday) morning and consider restarting eliquis. . Bradycardia resolved off sedation . On metoprolol 12.5 bid w/ tachycardia past 24h - increase metoprolol to 25 bid  . Resumed eliquis since [facility] has resolved and PEG has placed   Hypertension  Home meds:  Hydralazine 50 tid, metoprolol 25 bid  On Hydralazine 25 mg Q8 and metoprolol 25 bid . SBP 120s, on the low end  . Long-term BP goal normotensive  Hyperlipidemia  Home meds:  No statin  LDL 90, goal < 70  May consider  statin again ->pt adamantly has refused statins in the past  AKI on CKD  Stage III  Cre 1.29->1.26->1.24->1.29->1.35->1.23->1.27->1.29  Renal US R w/ increased echogenicity. 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 Secondary to stroke . SLP difficulty d/t pt participation. . D1  puree w/ honey thick liquids -> pt not swallowing and eating enough for daily needs -> Back to continuous feeding -> restarted TFs t a lower rate due to vomiting and persistent diarrhge Sunday 5/30 . S/p PEG 08/04/19, on TF @ 60, not able to tolerate bolus feeding . Remove sutures 6/8 . Speech and dietician on board  Other Stroke Risk Factors  Advanced age  Coronary artery disease  Chronic systolic Congestive heart failure  Other Active Problems  GERD on PPI   BPH on flomax PTA  Prostate cancer   Hypokalemia - resolved   LBBB. Appears old. EKG neg ischemia. Trops 27->32  (Likely strain related)  Small blood clots in loose stool with recurrence. (No indication for colonoscopy) Hgb 11.7->9.6->9.7->9.6->9.7->10.0  Loose stool on imodium scheduled  Thrombocytosis - resolved  Hospital Day #28   [name], MD PhD Stroke Neurology 08/06/2019 1:53 PM      To contact Stroke Continuity provider, please refer to [email or web address] After hours, contact General Neurology

## 2019-08-06 NOTE — Discharge Summary (Addendum)
Stroke Discharge Summary  Patient ID: Stephen Copeland   MRN: [id number]      DOB: [birth year]  Date of Admission: 07/09/2019 Date of Discharge: 08/26/2019  Attending Physician:  Garvin Fila, MD, Stroke MD Consultant(s):    Otelia Limes, MD (pulmonary/intensive care), Delice Lesch, MD (Physical Medicine & Rehabtilitation) and Reather Laurence, MD (general surgery-PEG) Patient's PCP:  [name]., MD  DISCHARGE DIAGNOSIS:  Principal Problem:   Acute ischemic stroke ([place]) -R MCA d/t R ICA and M2 occlusion s/p clot retrieval and R ICA stent Active Problems:   Hypertension   Hyperlipidemia   Gastroesophageal reflux disease   Atrial fibrillation with RVR (HCC)   Chronic systolic CHF (congestive heart failure) (HCC)   Acute systolic CHF (congestive heart failure) ([place])   Acute kidney injury superimposed on chronic kidney disease ([place])   BPH with obstruction/lower urinary tract symptoms   Middle cerebral artery embolism, right   Acute hypoxemic respiratory failure ([place])   History of prostate cancer   Stage 3 chronic kidney disease   Agitation   SAH (subarachnoid hemorrhage) ([place]) s/p clot retrieval and stent placement   Aspiration pneumonia ([place]), likely   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   L BBB (bundle branch block)   Prolonged Q-T interval on ECG   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   Frequent loose stools   Allergies as of 08/26/2019   No Known Allergies      Medication List     STOP taking these medications    apixaban 5 MG Tabs tablet Commonly known as: Eliquis   CALCIUM 600-D PO   CoQ10 100 MG Caps   hydrocortisone cream 1 %   metoprolol tartrate 25 MG tablet Commonly known as: LOPRESSOR Replaced by: metoprolol tartrate 25 mg/10 mL Susp   nitroGLYCERIN 0.4 MG SL tablet Commonly known as: NITROSTAT   omeprazole 20 MG capsule Commonly known as: PRILOSEC   tamsulosin 0.4 MG Caps capsule Commonly known as: FLOMAX   Vitamin D 50 MCG  (2000 UT) tablet       TAKE these medications    feeding supplement (ENSURE ENLIVE) Liqd Take 237 mLs by mouth 2 (two) times daily between meals.   feeding supplement (JEVITY 1.5 CAL/FIBER) Liqd Place 1,000 mLs into feeding tube continuous.   feeding supplement (PRO-STAT SUGAR FREE 64) Liqd Place 30 mLs into feeding tube 2 (two) times daily.   free water Soln Place 200 mLs into feeding tube every 4 (four) hours.   Gerhardt's butt cream Crea Apply 1 application topically every 8 (eight) hours as needed for irritation.   hydrALAZINE 25 MG tablet Commonly known as: APRESOLINE Place 1 tablet (25 mg total) into feeding tube every 8 (eight) hours. What changed:  medication strength See the new instructions.   lidocaine 5 % ointment Commonly known as: XYLOCAINE Apply 1 application topically 2 (two) times daily as needed (pain).   metoprolol tartrate 25 mg/10 mL Susp Commonly known as: LOPRESSOR Place 10 mLs (25 mg total) into feeding tube 2 (two) times daily. Replaces: metoprolol tartrate 25 MG tablet   pantoprazole sodium 40 mg/20 mL Pack Commonly known as: PROTONIX Place 20 mLs (40 mg total) into feeding tube daily.   Resource ThickenUp Clear Powd Take 120 g by mouth as needed (for honey thick liquid consistency).   ticagrelor 90 MG Tabs tablet Commonly known as: BRILINTA Place 1 tablet (90 mg total) into feeding tube 2 (two) times daily.  LABORATORY STUDIES CBC    Component Value Date/Time   WBC 8.7 08/25/2019 0501   RBC 2.99 (L) 08/25/2019 0501   HGB 9.0 (L) 08/25/2019 0501   HGB 14.3 04/14/2019 1326   HCT 28.5 (L) 08/25/2019 0501   HCT 41.7 04/14/2019 1326   PLT 412 (H) 08/25/2019 0501   PLT 252 04/14/2019 1326   MCV 95.3 08/25/2019 0501   MCV 94 04/14/2019 1326   MCH 30.1 08/25/2019 0501   MCHC 31.6 08/25/2019 0501   RDW 16.7 (H) 08/25/2019 0501   RDW 13.0 04/14/2019 1326   LYMPHSABS 1.0 07/10/2019 0259   LYMPHSABS 1.8 06/30/2018 0830    MONOABS 0.7 07/10/2019 0259   EOSABS 0.0 07/10/2019 0259   EOSABS 0.3 06/30/2018 0830   BASOSABS 0.0 07/10/2019 0259   BASOSABS 0.1 06/30/2018 0830   CMP    Component Value Date/Time   NA 135 08/25/2019 0501   NA 144 04/14/2019 1326   K 4.3 08/25/2019 0501   CL 99 08/25/2019 0501   CO2 26 08/25/2019 0501   GLUCOSE 135 (H) 08/25/2019 0501   BUN 27 (H) 08/25/2019 0501   BUN 28 (H) 04/14/2019 1326   CREATININE 1.43 (H) 08/25/2019 0501   CREATININE 1.15 01/30/2016 0949   CALCIUM 8.7 (L) 08/25/2019 0501   PROT 5.3 (L) 08/03/2019 1143   PROT 6.9 04/14/2019 1326   ALBUMIN 2.4 (L) 08/25/2019 0501   ALBUMIN 4.1 04/14/2019 1326   AST 62 (H) 08/03/2019 1143   ALT 72 (H) 08/03/2019 1143   ALKPHOS 75 08/03/2019 1143   BILITOT 1.1 08/03/2019 1143   BILITOT 0.8 04/14/2019 1326   GFRNONAA 47 (L) 08/25/2019 0501   GFRAA 54 (L) 08/25/2019 0501   COAGS Lab Results  Component Value Date   INR 1.4 (H) 07/09/2019   INR 1.9 (A) 04/29/2018   INR 2.4 04/08/2018   Lipid Panel    Component Value Date/Time   CHOL 141 07/10/2019 0259   TRIG 111 07/10/2019 0259   TRIG 113 07/10/2019 0259   HDL 29 (L) 07/10/2019 0259   CHOLHDL 4.9 07/10/2019 0259   VLDL 22 07/10/2019 0259   LDLCALC 90 07/10/2019 0259   HgbA1C  Lab Results  Component Value Date   HGBA1C 5.8 (H) 07/10/2019   Urinalysis    Component Value Date/Time   COLORURINE YELLOW 08/23/2019 1306   APPEARANCEUR HAZY (A) 08/23/2019 1306   LABSPEC 1.011 08/23/2019 1306   PHURINE 8.0 08/23/2019 1306   GLUCOSEU NEGATIVE 08/23/2019 1306   HGBUR NEGATIVE 08/23/2019 1306   BILIRUBINUR NEGATIVE 08/23/2019 1306   BILIRUBINUR negative 06/12/2016 1146   KETONESUR NEGATIVE 08/23/2019 1306   PROTEINUR NEGATIVE 08/23/2019 1306   UROBILINOGEN 1.0 06/12/2016 1146   NITRITE NEGATIVE 08/23/2019 1306   LEUKOCYTESUR NEGATIVE 08/23/2019 1306   Urine Drug Screen     Component Value Date/Time   LABOPIA NONE DETECTED 07/10/2019 0121    COCAINSCRNUR NONE DETECTED 07/10/2019 0121   LABBENZ NONE DETECTED 07/10/2019 0121   AMPHETMU NONE DETECTED 07/10/2019 0121   THCU NONE DETECTED 07/10/2019 0121   LABBARB NONE DETECTED 07/10/2019 0121    Alcohol Level    Component Value Date/Time   ETH <10 07/10/2019 0259    SIGNIFICANT DIAGNOSTIC STUDIES DG Abd 1 View  Result Date: 08/18/2019 CLINICAL DATA:  Nausea and vomiting for 1 day, history of gastric ulcer prostate cancer in hiatal hernia EXAM: ABDOMEN - 1 VIEW COMPARISON:  Numerous priors, most recently IR images 08/13/2019, abdominal radiograph 08/13/2019 FINDINGS: Redemonstration of  the left upper quadrant percutaneous gastrostomy tube. Mild gaseous distention of the stomach but without high-grade obstructive bowel gas pattern. Interval transit of the previously demonstrated high attenuation contrast material to the level of the rectum. There is opacification of innumerable right and left-sided colonic diverticula. Stable appearance of metallic radiodensities projecting over the symphysis pubis. Possibly fiducials or brachytherapy implants. No acute osseous or soft tissue abnormality. Stable degenerative changes in the spine and pelvis. IMPRESSION: 1. Mild gaseous distention of the stomach but without high-grade obstructive bowel gas pattern. 2. Interval transit of the previously demonstrated high attenuation contrast material to the level of the rectum. 3. Extensive colonic diverticulosis. Electronically Signed   By: Lovena Le M.D.   On: 08/18/2019 20:48   CT HEAD WO CONTRAST  Result Date: 08/02/2019 CLINICAL DATA:  Stroke, follow-up EXAM: CT HEAD WITHOUT CONTRAST TECHNIQUE: Contiguous axial images were obtained from the base of the skull through the vertex without intravenous contrast. COMPARISON:  07/16/2019 FINDINGS: Brain: Similar extent of previously seen right MCA territory infarction. Previous mild mass effect has essentially resolved. Hyperdense subarachnoid hemorrhage has  resolved. There is no new hemorrhage or loss of gray-white differentiation. Ventricles are stable in size. Stable findings of probable chronic microvascular ischemic changes. Vascular: There is atherosclerotic calcification at the skull base. Skull: Calvarium is unremarkable. Sinuses/Orbits: No acute finding. Other: None. 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 Previously seen hemorrhage has resolved. No new findings. Electronically Signed   By: Macy Mis M.D.   On: 08/02/2019 14:50   IR Replc Gastro/Colonic Tube Percut W/Fluoro  Result Date: 08/13/2019 INDICATION: Displaced percutaneous gastrostomy tube. A 16 French Foley catheter is currently maintaining the tract. EXAM: GASTROSTOMY CATHETER REPLACEMENT MEDICATIONS: None ANESTHESIA/SEDATION: None. CONTRAST:  26mL OMNIPAQUE IOHEXOL 300 MG/ML SOLN - administered into the gastric lumen. FLUOROSCOPY TIME:  Fluoroscopy Time: 0 minutes 6 seconds (1 mGy). COMPLICATIONS: None immediate. PROCEDURE: Informed written consent was obtained from the patient after a thorough discussion of the procedural risks, benefits and alternatives. All questions were addressed. Maximal Sterile Barrier Technique was utilized including caps, mask, sterile gowns, sterile gloves, sterile drape, hand hygiene and skin antiseptic. A timeout was performed prior to the initiation of the procedure. The Foley catheter balloon was deflated and the tube removed. A new 48 French percutaneous gastrostomy tube was inserted through the tract and into the stomach. The retention balloon was inflated and pulled snug against the anterior abdominal wall. The external bumper was fixed in place. Contrast injection was then performed under fluoroscopy. The injected contrast fills the stomach confirming intragastric location. IMPRESSION: Successful exchange for a new 37 French percutaneous gastrostomy tube which is within the stomach. Electronically Signed   By: Jacqulynn Cadet  M.D.   On: 08/13/2019 17:00   DG ABDOMEN PEG TUBE LOCATION  Result Date: 08/13/2019 CLINICAL DATA:  PEG tube injection EXAM: ABDOMEN - 1 VIEW COMPARISON:  Portable exam 1103 hours compared to 08/02/2019 FINDINGS: PEG tube was injected with 50 cc of Omnipaque 300. Contrast opacifies the gastric lumen, duodenal bulb, and duodenal sweep and extends into the proximal jejunal loops. Small duodenal diverticulum identified. No contrast extravasation. Atherosclerotic calcifications aorta. Bones demineralized. IMPRESSION: Replaced PEG tube is within the gastric lumen. Electronically Signed   By: Lavonia Dana M.D.   On: 08/13/2019 11:33   DG CHEST PORT 1 VIEW  Result Date: 08/16/2019 CLINICAL DATA:  Acute ischemic stroke.  Chest pain. EXAM: PORTABLE CHEST 1 VIEW COMPARISON:  08/04/2019 FINDINGS: Grossly unchanged enlarged  cardiac silhouette and mediastinal contours with atherosclerotic plaque within thoracic aorta. There is persistent thickening of the right paratracheal stripe presumably secondary prominent vasculature. There is persistent mild elevation/eventration of the right hemidiaphragm with associated right infrahilar and basilar heterogeneous opacities. No definite pleural effusion however the left costophrenic angle is excluded from view. No new focal airspace opacities. No pneumothorax. No evidence of edema. No acute osseous abnormalities. IMPRESSION: 1. Cardiomegaly without superimposed acute cardiopulmonary disease. 2. Chronic elevation of the right hemidiaphragm with associated right basilar atelectasis. Electronically Signed   By: Sandi Mariscal M.D.   On: 08/16/2019 08:43   DG CHEST PORT 1 VIEW  Result Date: 08/04/2019 CLINICAL DATA:  Shortness of breath. EXAM: PORTABLE CHEST 1 VIEW COMPARISON:  Aug 02, 2019. FINDINGS: Stable cardiomegaly. No pneumothorax or pleural effusion is noted. Stable elevated right hemidiaphragm is noted mild right basilar subsegmental atelectasis. Left lung is clear. Bony  thorax is unremarkable. IMPRESSION: Stable elevated right hemidiaphragm with mild right basilar subsegmental atelectasis. Electronically Signed   By: Marijo Conception M.D.   On: 08/04/2019 15:38   DG Chest Port 1 View  Result Date: 08/02/2019 CLINICAL DATA:  Diarrhea.  Nausea and vomiting. EXAM: PORTABLE CHEST 1 VIEW COMPARISON:  07/25/2019 FINDINGS: Cardiomegaly remains stable. Aortic atherosclerosis noted. Feeding tube is again seen entering the stomach. Elevation of right hemidiaphragm again seen with right basilar atelectasis, which shows no significant change. Left lung is clear. IMPRESSION: Stable elevation of right hemidiaphragm and right basilar atelectasis. Stable cardiomegaly. Electronically Signed   By: Marlaine Hind M.D.   On: 08/02/2019 12:18   DG Abd Portable 1V  Result Date: 08/02/2019 CLINICAL DATA:  Diarrhea, nausea, vomiting. EXAM: PORTABLE ABDOMEN - 1 VIEW COMPARISON:  07/25/2019 FINDINGS: Feeding tube extends into the distal stomach. Less prominent dilatation of small bowel compared to the prior study with probable component of minimal residual ileus involving small bowel and colon. IMPRESSION: Less prominent dilatation of small bowel compared to the prior study with probable component of minimal residual ileus. Electronically Signed   By: Aletta Edouard M.D.   On: 08/02/2019 12:22      HISTORY OF PRESENT ILLNESS Stephen Copeland is a 79 y.o. male PMH of Afib, systolic CHF, HLD, CAD, HTN, prostate cancer, presenting to ER with complaints of left sided weakness. Brought in by EMS after EMS was called to his home.  Last known normal-details are inconsistent but he reported being normal last night 07/08/2019 at 1 AM and woke up this morning feeling his speech was slurred.  He also noticed some left-sided weakness.  He could not get out of bed.  He takes care of his wife at home, who helped him take his medications.  He took his Eliquis this morning.  He continued to be weak on the left side and  eventually called EMS.  When they assessed him, then noted the left-sided weakness, but he was outside the window for IV TPA.  He was not brought in as an acute code stroke but on evaluation by the ED provider, an acute code stroke, possible LVO was activated as he still within the 24-hour window. He seems to be reliable historian but at times perseverates as well. Denies any chest pain nausea vomiting.  Denies any visual symptoms.  Denies any fevers or chills.  Denies any abdominal pain. Reports that he still drives, takes care of his ADLs himself, uses a cane to walk-very rarely would use his wife's walker. Noncontrast head CT with aspect  7 consistent with a right MCA stroke. There was some delay in obtaining imaging because of his severe dysarthria and trying to get hold of family members to get a more clear history.  Multiple attempts to call wife were futile. CTA head and neck was done along with CT perfusion study.  Favorable profile for endovascular treatment. Discussed with Dr. Estanislado Pandy over the phone and IR code stroke activated. Also noted to be in A. fib with RVR in the ER. He was not a tPA candidate as he was outside the window. Premorbid modified Rankin scale (mRS): 2.   HOSPITAL COURSE Stephen Copeland is a 79 y.o. male with history of Afib, systolic CHF, HLD, CAD, HTN, prostate cancer presenting with left sided weakness, facial droop, dysarthria.    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 embolic secondary to known AF even on Eliquis Code Stroke CT head evolving R MCA territory infarct (insula, cerebral cortex). hyperdense R M2 +/- M3. ASPECTS 7   CTA head & neck +LVO. Probable ruptured plaque R ICA bifurcation w/ near occlusive stenosis, possible superimposed dissection. Distal reconstitution R M1 from collaterals but w/ distal embolic  occlusion proximal R M2/3 superior division branch. L ICA bifurcation and siphon atherosclerosis. B VA origins 50% stenoses. R subclavian artery origin 60% stenosis.  CT perfusion 19cc core infarct R insula and posterior frontal. 33cc penumbra.  Cerebral angio TICI3 revascularization occluded middle trifurcation R MCA branch, revascularization R ICA occlusion w/ angioplasty and stenting.  Post IR CT contrast stain R putamen and moderate R sylvian fissure, SAH + contrast.   CT head R sylvian fissure SAH. Increased contrast vs hemorrhage R suprasellar cistern following ICA concerned for increased SAH. R sulci effacement.  Carotid Doppler  R ICA stent patent MRI  Patchy R MCA infarcts. SAH R sylvian fissure. Small IVH.  MRA - Severely motion degraded head MRA. Persistent patency of the revascularized right ICA. No flow limiting proximal stenosis identified.  CT head repeat 5/11 R insular infarct stable. Residual small SAH and small IVH. CT head repeat 5/13 evolving R MCA infarct, SAH unchanged  CT 5/30 SAH resolved.  2D Echo EF 35-40%. No source of embolus  Eliquis (apixaban) daily prior to admission, now on Brilinta (ticagrelor) 90 mg bid. Continue brilinta for total 3 months and then switch to plavix. (per Dr. Estanislado Pandy).  had tried to give Eliquis with Brilinta but on hold given repeated bloody stools and anemia. Therapy recommendations:  CIR - > no family support to provide care at home. Will need long-term placement  Disposition:  SNF He did not get COVID vaccine, requiring a quarantine room in SNF COVID testing done 6/23 neg   Acute hypoxemic respiratory failure, resolved Likely Aspiration PNA, treated Extubated w/ CCM signing off 5/8 Vomiting x 5 with possible aspiration requiring NRB 5/10. CCM reconsulted. Transferred to ICU and Intubated Extubated 5/11 w/o difficulties Unasyn 5/10>>5/17 for 7 days Leukocytosis - resolved At that time CXR - slight improvement aeration LLL w/  atx/infiltrate. Cardiomegaly.  ABG normal  Robitussin prn    R Carotid Stenosis / possible Dissection S/p R ICA stent ([place]) Received aspirin, brilinta and Integrilin in IR CUS and MRA both confirm R ICA patent post stent On Brilinta alone Eliquis on hold given bloody stool and anemia Ok to change Brilinta to plavix  after 3 months if cost / obtainment is an issue   Atrial Fibrillation w/ RVR, stablized Home anticoagulation:  Eliquis (apixaban) daily  On metoprolol 25 bid  Was on eliquis since SAH resolved and PEG was placed Currently off eliquis d/t bloody stools and anemia   Hypertension Home meds:  Hydralazine 50 tid, metoprolol 25 bid On Hydralazine 25 mg Q8 and metoprolol 25 bid Long-term goal normotensive    Hyperlipidemia Home meds:  No statin LDL 90, goal < 70 pt refuses statin   AKI on CKD  Stage III Cre 1.26 Renal US R w/ increased echogenicity. 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 Stable since   Dysphagia Secondary to stroke SLP difficulty d/t pt participation. D1 puree w/ honey thick liquids -> pt not swallowing and eating enough for daily needs -> Back to continuous feeding 5/30 S/p PEG 08/04/19 and replacement 08/13/19 following unplanned removal Speech and dietician on board Intermittent nausea with small yellow vomitus 6/15 -> hold off TF and on IVF->resume 6/16 Added low dose reglan to facilitate GI mobility  KUB Extensive colonic diverticulosis Sutures out 6/8   BRBPR, resolved Episodes of N, V and diarrhea throughout admission. Pt did not tolerate CorTrak -> PEG place 08/04/19 and re-insert 08/13/19 Jevity TF 60 ml /  hour with free water 150 mls Q 4 hrs Portable Abd 08/02/19 - Less prominent dilatation of small bowel compared to the prior study with probable component of minimal residual ileus Imodium 2 mg per tube Q 6 hrs for diarrhea since 07/25/19. Small blood clots in loose stool with recurrence. (No indication for colonoscopy)  Protonix 40 mg Bid per tube since 07/19/19 for GERD Eliquis again on hold - pt still getting Brilinta Hgb 9.2 Will hold eliquis at d/c   Agitation -consistent with acute delirium, resolved The pt was extremely agitated and required several doses of ativan to calm - felt at one point we made need to get a sitter Unclear what prompted the episode Pharmacy advised against any anti psychotics due to pt's hx of prolonged QT interval prn ativan - no longer needed Labs stable; repeating labs including urinalysis. (UA hazy o/w normal)   Other Stroke Risk Factors Advanced age Coronary artery disease   Other Active Problems  BPH on flomax PTA Prostate cancer  Hypokalemia - resolved  LBBB. Appears old. EKG neg ischemia. Trops 27->32  (Likely strain related) Thrombocytosis - resolved Insomnia - no longer on ambien     DISCHARGE EXAM Blood pressure 133/63, pulse 67, temperature 98 F (36.7 C), temperature source Oral, resp. rate 18, height 6\' 2"  (1.88 m), weight 77.1 kg, SpO2 96 %. General - thin, well developed elderly caucasian male, not in acute distress   Ophthalmologic - fundi not visualized due to noncooperation.   Cardiovascular - Regular rhythm and rate, no M/R/G   Neuro -he is laying in bed with eyes closed but opens eyes verbally to commands.  He does follow commands briskly and is mostly lucid today.  Speech is mildly dysarthric.  He is oriented to location, month and year.  He moves all 4 extremities well.  No abnormal movements are  noted such as myoclonus or tremors.  Discharge Diet   Dysphagia 1 Honey thick liquid diet, 24/7 tube feedings via PEG  DISCHARGE  PLAN Disposition:  Skilled nursing facility for ongoing PT, OT and ST.  Brilinta (ticagrelor) 90 mg bid and Eliquis (apixaban) daily for secondary stroke prevention for 3 months then change Brilinta to Plavix and continue Eliquis Ongoing stroke risk factor control by Primary Care Physician at time of discharge Follow-up PCP [name]., MD in 2 weeks. Follow-up in [facility] in 4 weeks, office to schedule an appointment.   45 minutes were spent preparing discharge.  Burnetta Sabin, MSN, APRN, ANVP-BC, AGPCNP-BC Advanced Practice Stroke Nurse [place] for Schedule & Pager information 08/26/2019 2:19 PM   I have personally obtained history,examined this patient, reviewed notes, independently viewed imaging studies, participated in medical decision making and plan of care.ROS completed by me personally and pertinent positives fully documented  I have made any additions or clarifications directly to the above note. Agree with note above.    Antony Contras, MD Medical Director [place] Pager: [phone] 08/26/2019 5:13 PM

## 2019-08-06 NOTE — Plan of Care (Signed)
  Problem: Safety: Goal: Ability to remain free from injury will improve Outcome: Progressing   Problem: Ischemic Stroke/TIA Tissue Perfusion: Goal: Complications of ischemic stroke/TIA will be minimized Outcome: Progressing   Problem: Ischemic Stroke/TIA Tissue Perfusion: Goal: Complications of ischemic stroke/TIA will be minimized Outcome: Progressing

## 2019-08-07 DIAGNOSIS — I69391 Dysphagia following cerebral infarction: Secondary | ICD-10-CM

## 2019-08-07 DIAGNOSIS — E860 Dehydration: Secondary | ICD-10-CM

## 2019-08-07 LAB — GLUCOSE, CAPILLARY
Glucose-Capillary: 111 mg/dL — ABNORMAL HIGH (ref 70–99)
Glucose-Capillary: 114 mg/dL — ABNORMAL HIGH (ref 70–99)
Glucose-Capillary: 118 mg/dL — ABNORMAL HIGH (ref 70–99)
Glucose-Capillary: 120 mg/dL — ABNORMAL HIGH (ref 70–99)
Glucose-Capillary: 126 mg/dL — ABNORMAL HIGH (ref 70–99)
Glucose-Capillary: 138 mg/dL — ABNORMAL HIGH (ref 70–99)
Glucose-Capillary: 154 mg/dL — ABNORMAL HIGH (ref 70–99)
Glucose-Capillary: 98 mg/dL (ref 70–99)

## 2019-08-07 LAB — CBC
HCT: 30.7 % — ABNORMAL LOW (ref 39.0–52.0)
Hemoglobin: 9.8 g/dL — ABNORMAL LOW (ref 13.0–17.0)
MCH: 30.3 pg (ref 26.0–34.0)
MCHC: 31.9 g/dL (ref 30.0–36.0)
MCV: 95 fL (ref 80.0–100.0)
Platelets: 374 10*3/uL (ref 150–400)
RBC: 3.23 MIL/uL — ABNORMAL LOW (ref 4.22–5.81)
RDW: 14.5 % (ref 11.5–15.5)
WBC: 11.8 10*3/uL — ABNORMAL HIGH (ref 4.0–10.5)
nRBC: 0 % (ref 0.0–0.2)

## 2019-08-07 LAB — BASIC METABOLIC PANEL
Anion gap: 11 (ref 5–15)
BUN: 28 mg/dL — ABNORMAL HIGH (ref 8–23)
CO2: 27 mmol/L (ref 22–32)
Calcium: 8.6 mg/dL — ABNORMAL LOW (ref 8.9–10.3)
Chloride: 100 mmol/L (ref 98–111)
Creatinine, Ser: 1.34 mg/dL — ABNORMAL HIGH (ref 0.61–1.24)
GFR calc Af Amer: 58 mL/min — ABNORMAL LOW (ref >60)
GFR calc non Af Amer: 50 mL/min — ABNORMAL LOW (ref >60)
Glucose, Bld: 122 mg/dL — ABNORMAL HIGH (ref 70–99)
Potassium: 4.3 mmol/L (ref 3.5–5.1)
Sodium: 138 mmol/L (ref 135–145)

## 2019-08-07 MED ORDER — FREE WATER
150.0000 mL | Status: DC
  Administered 2019-08-07 – 2019-08-10 (×13): 150 mL

## 2019-08-07 NOTE — TOC Progression Note (Signed)
Transition of Care [facility]) - Progression Note    Patient Details  Name: Stephen Copeland MRN: [id number] Date of Birth: 1940/08/03  Transition of Care [facility]) CM/SW [place] Phone Number: 08/07/2019, 11:54 AM  Clinical Narrative:   CSW confirmed that [facility] is able to admit the patient, will have a quarantine bed available for the patient on Monday. MD to order COVID test on Sunday if stable to plan for discharge to SNF on Monday. CSW contacted patient's wife and updated her on plan, she was appreciative of update. CSW to follow.    Expected Discharge Plan: [place] Barriers to Discharge: Continued Medical Work up  Expected Discharge Plan and Services Expected Discharge Plan: [place] arrangements for the past 2 months: Single Family Home                                       Social Determinants of Health (SDOH) Interventions    Readmission Risk Interventions No flowsheet data found.

## 2019-08-07 NOTE — Progress Notes (Signed)
Nutrition Follow-up  DOCUMENTATION CODES:   Not applicable  INTERVENTION:  Continue via PEG: Jevity1.5 @ 60 ml/hr (1423m per day) 30 ml Prostat daily 1572mfree water Q6H(or per MD/PA)  Provides: 2260 kcal, 106grams protein, and 1094 ml free water(169471motal free water with flushes).  NUTRITION DIAGNOSIS:   Inadequate oral intake related to inability to eat as evidenced by NPO status.  Ongoing.  GOAL:   Patient will meet greater than or equal to 90% of their needs  Met with TF.   MONITOR:   TF tolerance, Labs  REASON FOR ASSESSMENT:   Consult Calorie Count  ASSESSMENT:   Pt with PMH of CHF, CAD, cardiomyopathy, hiatal hernia, HLD, and HTN now admitted with R CVA s/p thrombectomy and carotid stent.  5/6 admitted s/p IR, remained intubated post-op 5/7 extubated; cortrak placed  5/10 N/V with normal KUB; intubated 5/11 extubated, cortrak removed 5/12 failed swallow eval; cortrak replaced as tube was inadvertently pulled with extubation 5/17 diet upgraded to dysphagia 1 with honey thick liquids 5/18 TF switched to nocturnal 5/20 TF returned to continuous due to poor po intake 6/1 s/p EGD and PEG placement 6/3 pt transitioned to bolus feeding  Discussed pt with MD. Pt noted to not have tolerated bolus feeding. MD switched pt back to continuous feeding.   TF: Jevity 1.5 cal @ 96m47m, 30ml88m-stat daily, 150ml 42m water Q6H (per MD)  Labs reviewed. CBGs [id number] reviewed and include: Novolog, Imodium   Diet Order:   Diet Order            DIET - DYS 1 Room service appropriate? Yes; Fluid consistency: Honey Thick  Diet effective 1400              EDUCATION NEEDS:   No education needs have been identified at this time  Skin:  Skin Assessment: Skin Integrity Issues: Skin Integrity Issues:: Incisions, Other (Comment) Incisions: R groin Other: MASD buttocks  Last BM:  6/1  Height:   Ht Readings from Last 1 Encounters:   08/04/19 '6\' 2"'  (1.88 m)    Weight:   Wt Readings from Last 1 Encounters:  08/04/19 83.9 kg    Ideal Body Weight:  86.3 kg  BMI:  Body mass index is 23.75 kg/m.  Estimated Nutritional Needs:   Kcal:  2100-2300  Protein:  100-115 grams  Fluid:  >2 L/day   AmandaLarkin InaRD, LDN RD pager number and weekend/on-call pager number located in [email or web address]

## 2019-08-07 NOTE — Progress Notes (Addendum)
STROKE TEAM PROGRESS NOTE   INTERVAL HISTORY NT and RN at bedside bathing him, he is awake alert and as per RN talkative. Bolus TF was changed back to continuous TF as he did not tolerate with bolus feeding via PEG.  Vitals:   08/06/19 1944 08/06/19 2323 08/07/19 0402 08/07/19 0815  BP: 125/77 113/80 (!) 152/77 (!) 142/83  Pulse: 74 79 80 75  Resp: 20 19 18    Temp: 98.2 F (36.8 C) 98.6 F (37 C) 98.2 F (36.8 C) 98.7 F (37.1 C)  TempSrc: Axillary Oral Oral Oral  SpO2: 96% 97% 93% 96%  Weight:      Height:       CBC:  Recent Labs  Lab 08/06/19 0335 08/07/19 0455  WBC 11.4* 11.8*  HGB 10.0* 9.8*  HCT 31.2* 30.7*  MCV 93.7 95.0  PLT 389 643   Basic Metabolic Panel:  Recent Labs  Lab 08/06/19 0335 08/07/19 0455  NA 137 138  K 3.7 4.3  CL 100 100  CO2 28 27  GLUCOSE 119* 122*  BUN 23 28*  CREATININE 1.29* 1.34*  CALCIUM 8.2* 8.6*    IMAGING past 24 hours No results found.   PHYSICAL EXAM   Temp:  [98 F (36.7 C)-98.7 F (37.1 C)] 98.7 F (37.1 C) (06/04 0815) Pulse Rate:  [74-95] 75 (06/04 0815) Resp:  [18-22] 18 (06/04 0402) BP: (113-152)/(76-83) 142/83 (06/04 0815) SpO2:  [93 %-97 %] 96 % (06/04 0815)  General - thin, well developed, lethargic.  Ophthalmologic - fundi not visualized due to noncooperation.  Cardiovascular - Regular rhythm and rate, tachycardia resolved.  Neuro - lethargic but oriented x2, not to time, he follows simple commands, mild dysarthria, able to name and repeat, but psychomotor slowing, paucity of speech, blinks to threat bilaterally, extraocular muscles intact, pupils appear equally round and reactive to light, left lower facial weakness however he reports sensation is intact in his face, tongue midline. BUE 4/5. BLE 3/5 proximal and distal. DTR 1+ and no babinski. Sensation intact subjectively, FTN not cooperative today. Gait not tested.   ASSESSMENT/PLAN Mr. Jamori Biggar is a 79 y.o. male with history of Afib, systolic  CHF, HLD, CAD, HTN, prostate cancer presenting with left sided weakness, facial droop, dysarthria.   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 embolic secondary to known AF even on Eliquis  Code Stroke CT head evolving R MCA territory infarct (insula, cerebral cortex). hyperdense R M2 +/- M3. ASPECTS 7    CTA head & neck +LVO. Probable ruptured plaque R ICA bifurcation w/ near occlusive stenosis, possible superimposed dissection. Distal reconstitution R M1 from collaterals but w/ distal embolic occlusion proximal R M2/3 superior division branch. L ICA bifurcation and siphon atherosclerosis. B VA origins 50% stenoses. R subclavian artery origin 60% stenosis.   CT perfusion 19cc core infarct R insula and posterior frontal. 33cc penumbra.   Cerebral angio TICI3 revascularization occluded middle trifurcation R MCA branch, revascularization R ICA occlusion w/ angioplasty and stenting.   Post IR CT contrast stain R putamen and moderate R sylvian fissure, SAH + contrast.    CT head R sylvian fissure SAH. Increased contrast vs hemorrhage R suprasellar cistern following ICA concerned for increased SAH. R sulci effacement.   Carotid Doppler  R ICA stent patent  MRI  Patchy R MCA infarcts.  SAH R sylvian fissure. Small IVH.   MRA - Severely motion degraded head MRA. Persistent patency of the revascularized right ICA. No flow limiting proximal stenosis identified.   CT head repeat 5/11 R insular infarct stable. Residual small SAH and small IVH.  CT head repeat 5/13 evolving R MCA infarct, SAH unchanged   CT 5/30 SAH resolved.   2D Echo EF 35-40%. No source of embolus   LDL 90  HgbA1c 5.8  SCDs for VTE prophylaxis  Eliquis (apixaban) daily prior to admission, now on Brilinta (ticagrelor) 90 mg bid and Eliquis (apixaban) daily. Continue brilinta for  total 3 months and then switch to plavix and continue Eliquis (per Dr. Estanislado Pandy).    Therapy recommendations:  CIR - > pt w/o family support at home. Will need SNF  Disposition:  SNF  Plan for d/c on monday 6/7- quarantine bed will be available then - have to check COVID before d/c, to be ordered on sunday. He did not get COVID vaccine, requiring a quarantine room in SNF  Acute hypoxemic respiratory failure, resolved Likely Aspiration PNA, treated  Extubated w/ CCM signing off 5/8  Vomiting x 5 with possible aspiration requiring NRB 5/10. CCM reconsulted. Transferred to ICU and Intubated  Extubated 5/11 w/o difficulties  Unasyn 5/10>>5/17 for 7 days  Leukocytosis - resolved  CXR - slight improvement aeration LLL w/ atx/infiltrate. Cardiomegaly.   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 Hold AC given post IR SAH and need for DAPT post stent placement. PEG Monday. Repeat head CT after PEG (Monday or Tuesday) morning and consider restarting eliquis. . Bradycardia resolved off sedation . On metoprolol 12.5 bid w/ tachycardia past 24h - increase metoprolol to 25 bid  . Resumed eliquis since [facility] has resolved and PEG has placed   Hypertension  Home meds:  Hydralazine 50 tid, metoprolol 25 bid  On Hydralazine 25 mg Q8 and metoprolol 25 bid . SBP 120s, on the low end  . Long-term BP goal normotensive  Hyperlipidemia  Home meds:  No statin  LDL 90, goal < 70  May consider statin again ->pt adamantly has refused statins in the past  AKI on CKD  Stage III  Cre 1.29->1.26->1.24->1.29->1.35->1.23->1.27->1.29->1.34  Renal US R w/ increased echogenicity. 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 Secondary to stroke . SLP difficulty d/t pt participation. . D1 puree w/ honey thick liquids -> pt not swallowing and eating enough for daily needs -> Back to continuous feeding -> restarted TFs t a lower rate due to vomiting and persistent diarrhge Sunday 5/30 . S/p PEG 08/04/19, on TF @ 60 and FW 150 Q4 . Remove sutures 6/8 . Speech and  dietician on board  Other Stroke Risk Factors  Advanced age  Coronary artery disease  Chronic systolic Congestive heart failure  Other Active Problems  GERD on PPI   BPH on flomax PTA  Prostate cancer   Hypokalemia - resolved   LBBB. Appears old. EKG neg ischemia. Trops 27->32  (Likely strain related)  Small blood clots in loose stool with recurrence. (No indication for colonoscopy) Hgb 11.7->9.6->9.7->9.6->9.7->10.0->9.8  Loose stool on imodium scheduled  Thrombocytosis - resolved  Hospital Day #29   [name], MD PhD Stroke Neurology 08/07/2019 9:11 PM   To contact Stroke Continuity provider, please refer to [email or web address] After hours, contact General Neurology

## 2019-08-07 NOTE — Progress Notes (Signed)
Physical Therapy Treatment Patient Details Name: Stephen Copeland MRN: [id number] DOB: 20-Mar-1940 Today's Date: 08/07/2019    History of Present Illness 79 y.o. male admitted on 07/09/19 for L sided weakness, facial droop, and slurred speech.  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  He did not get any tPA as he was outside of the window.  Pt intubated 5/6 for procedure and extubated in the AM of 07/10/19.  Pt with other significant PMH of sinus brady, scoliosis, prostate CA s/p surgery, HTN, dilated cardiomyopathy, CAD, CHF.  Patient re-intubated 5/10 with inability to clear secretions.. Extubated 5/11. PEG placed 6/1.    PT Comments    Pt was alert, but confused and mildly agitated that therapies was "making" him move when all he wanted to do was "eat and watch tv".  Encourage extensively with little results other that sitting with resistance and back to supine with equal resistance.  Warm up of LE's prior to mobility.    Follow Up Recommendations  SNF     Equipment Recommendations  Other (comment)(TBA next venue)    Recommendations for Other Services       Precautions / Restrictions Precautions Precautions: Fall Precaution Comments: PEG    Mobility  Bed Mobility Overal bed mobility: Needs Assistance Bed Mobility: Supine to Sit;Sit to Supine     Supine to sit: +2 for physical assistance;Max assist;Mod assist Sit to supine: Max assist;+2 for physical assistance   General bed mobility comments: pt could do more, but is resistant through out.    Transfers                 General transfer comment: pt is "emphatic" that he won't try to get up.  Ambulation/Gait                 Stairs             Wheelchair Mobility    Modified Rankin (Stroke Patients Only) Modified Rankin (Stroke Patients Only) Modified Rankin: Severe disability     Balance     Sitting balance-Leahy Scale: Poor                                       Cognition Arousal/Alertness: Awake/alert Behavior During Therapy: Agitated Overall Cognitive Status: Impaired/Different from baseline                   Orientation Level: Time;Situation Current Attention Level: Sustained Memory: Decreased short-term memory Following Commands: Follows one step commands inconsistently Safety/Judgement: Decreased awareness of safety;Decreased awareness of deficits Awareness: Intellectual          Exercises Other Exercises Other Exercises: warm up ROM exercise to bil LE's prior to moving.    General Comments        Pertinent Vitals/Pain Pain Assessment: Faces Faces Pain Scale: Hurts a little bit Pain Location: general with movement Pain Intervention(s): Monitored during session    Home Living                      Prior Function            PT Goals (current goals can now be found in the care plan section) Acute Rehab PT Goals Patient Stated Goal: less pain  PT Goal Formulation: With patient Time For Goal Achievement: 08/19/19 Potential to Achieve Goals: Fair Progress towards PT goals: Not progressing toward  goals - comment(pt is resistant through out.)    Frequency    Min 3X/week      PT Plan Current plan remains appropriate    Co-evaluation              AM-PAC PT "6 Clicks" Mobility   Outcome Measure  Help needed turning from your back to your side while in a flat bed without using bedrails?: A Lot Help needed moving from lying on your back to sitting on the side of a flat bed without using bedrails?: Total Help needed moving to and from a bed to a chair (including a wheelchair)?: Total Help needed standing up from a chair using your arms (e.g., wheelchair or bedside chair)?: Total Help needed to walk in hospital room?: Total Help needed climbing 3-5 steps with a railing? : Total 6 Click Score: 7    End of Session   Activity Tolerance: Treatment limited secondary to  agitation Patient left: in bed;with call bell/phone within reach;with bed alarm set;with family/visitor present Nurse Communication: Mobility status PT Visit Diagnosis: Muscle weakness (generalized) (M62.81);Other symptoms and signs involving the nervous system (R29.898);Hemiplegia and hemiparesis Hemiplegia - Right/Left: Left Hemiplegia - dominant/non-dominant: Non-dominant Hemiplegia - caused by: Cerebral infarction     Time: 5537-4827 PT Time Calculation (min) (ACUTE ONLY): 20 min  Charges:  $Therapeutic Activity: 8-22 mins                     08/07/2019  Ginger Carne., PT Acute Rehabilitation Services [phone]  (pager) [phone]  (office)   Tessie Fass Shermon Bozzi 08/07/2019, 5:17 PM

## 2019-08-08 LAB — BASIC METABOLIC PANEL
Anion gap: 10 (ref 5–15)
BUN: 30 mg/dL — ABNORMAL HIGH (ref 8–23)
CO2: 26 mmol/L (ref 22–32)
Calcium: 8.6 mg/dL — ABNORMAL LOW (ref 8.9–10.3)
Chloride: 100 mmol/L (ref 98–111)
Creatinine, Ser: 1.3 mg/dL — ABNORMAL HIGH (ref 0.61–1.24)
GFR calc Af Amer: >60 mL/min (ref >60)
GFR calc non Af Amer: 52 mL/min — ABNORMAL LOW (ref >60)
Glucose, Bld: 119 mg/dL — ABNORMAL HIGH (ref 70–99)
Potassium: 4.3 mmol/L (ref 3.5–5.1)
Sodium: 136 mmol/L (ref 135–145)

## 2019-08-08 LAB — CBC
HCT: 31.9 % — ABNORMAL LOW (ref 39.0–52.0)
Hemoglobin: 10.2 g/dL — ABNORMAL LOW (ref 13.0–17.0)
MCH: 30.2 pg (ref 26.0–34.0)
MCHC: 32 g/dL (ref 30.0–36.0)
MCV: 94.4 fL (ref 80.0–100.0)
Platelets: 406 10*3/uL — ABNORMAL HIGH (ref 150–400)
RBC: 3.38 MIL/uL — ABNORMAL LOW (ref 4.22–5.81)
RDW: 14.4 % (ref 11.5–15.5)
WBC: 10.2 10*3/uL (ref 4.0–10.5)
nRBC: 0 % (ref 0.0–0.2)

## 2019-08-08 LAB — GLUCOSE, CAPILLARY
Glucose-Capillary: 124 mg/dL — ABNORMAL HIGH (ref 70–99)
Glucose-Capillary: 107 mg/dL — ABNORMAL HIGH (ref 70–99)
Glucose-Capillary: 128 mg/dL — ABNORMAL HIGH (ref 70–99)
Glucose-Capillary: 137 mg/dL — ABNORMAL HIGH (ref 70–99)
Glucose-Capillary: 130 mg/dL — ABNORMAL HIGH (ref 70–99)
Glucose-Capillary: 114 mg/dL — ABNORMAL HIGH (ref 70–99)

## 2019-08-08 NOTE — Progress Notes (Signed)
   08/08/19 2326  Provider Notification  Provider Name/Title Dr Rory Percy  Date Provider Notified 08/08/19  Time Provider Notified 2323  Notification Type Page  Notification Reason Other (Comment) (Pt having bloody stool)  Response See new orders  Date of Provider Response 08/08/19 (came up to see pt)  Time of Provider Response 2324

## 2019-08-08 NOTE — Progress Notes (Signed)
STROKE TEAM PROGRESS NOTE   INTERVAL HISTORY  RN at bedside. Pt awake alert, sitting in bed comfortably. No distress. Asking RN to help him call his wife.   Vitals:   08/07/19 2343 08/08/19 0421 08/08/19 0445 08/08/19 0800  BP: 118/74 136/62  131/72  Pulse: (!) 58 67  71  Resp: 20 19  16   Temp: 98.4 F (36.9 C) 98 F (36.7 C)  (!) 97.5 F (36.4 C)  TempSrc: Oral Oral  Oral  SpO2: 98% 97%  96%  Weight:   77.9 kg   Height:       CBC:  Recent Labs  Lab 08/07/19 0455 08/08/19 0632  WBC 11.8* 10.2  HGB 9.8* 10.2*  HCT 30.7* 31.9*  MCV 95.0 94.4  PLT 374 338*   Basic Metabolic Panel:  Recent Labs  Lab 08/07/19 0455 08/08/19 0632  NA 138 136  K 4.3 4.3  CL 100 100  CO2 27 26  GLUCOSE 122* 119*  BUN 28* 30*  CREATININE 1.34* 1.30*  CALCIUM 8.6* 8.6*    IMAGING past 24 hours No results found.   PHYSICAL EXAM  Temp:  [97.5 F (36.4 C)-98.4 F (36.9 C)] 97.5 F (36.4 C) (06/05 0800) Pulse Rate:  [58-78] 71 (06/05 0800) Resp:  [13-20] 16 (06/05 0800) BP: (118-151)/(62-90) 131/72 (06/05 0800) SpO2:  [96 %-99 %] 96 % (06/05 0800) Weight:  [77.9 kg] 77.9 kg (06/05 0445)  General - thin, well developed, not in acute distress  Ophthalmologic - fundi not visualized due to noncooperation.  Cardiovascular - Regular rhythm and rate, tachycardia resolved.  Neuro - awake alert, oriented x2, not to time, he follows simple commands, mild dysarthria, able to name and repeat, but psychomotor slowing, blinks to threat bilaterally, extraocular muscles intact, pupils appear equally round and reactive to light, left lower facial weakness however he reports sensation is intact in his face, tongue midline. BUE 4/5. BLE 3/5 proximal and distal. DTR 1+ and no babinski. Sensation intact subjectively, FTN not cooperative today. Gait not tested.   ASSESSMENT/PLAN Stephen Copeland is a 79 y.o. male with history of Afib, systolic CHF, HLD, CAD, HTN, prostate cancer presenting with left  sided weakness, facial droop, dysarthria.   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 embolic secondary to known AF even on Eliquis  Code Stroke CT head evolving R MCA territory infarct (insula, cerebral cortex). hyperdense R M2 +/- M3. ASPECTS 7    CTA head & neck +LVO. Probable ruptured plaque R ICA bifurcation w/ near occlusive stenosis, possible superimposed dissection. Distal reconstitution R M1 from collaterals but w/ distal embolic occlusion proximal R M2/3 superior division branch. L ICA bifurcation and siphon atherosclerosis. B VA origins 50% stenoses. R subclavian artery origin 60% stenosis.   CT perfusion 19cc core infarct R insula and posterior frontal. 33cc penumbra.   Cerebral angio TICI3 revascularization occluded middle trifurcation R MCA branch, revascularization R ICA occlusion w/ angioplasty and stenting.   Post IR CT contrast stain R putamen and moderate R sylvian fissure, SAH + contrast.    CT head R sylvian fissure SAH. Increased contrast vs hemorrhage R suprasellar cistern following ICA concerned for increased SAH. R sulci effacement.   Carotid Doppler  R ICA stent patent  MRI  Patchy R MCA infarcts. SAH R sylvian fissure. Small IVH.  MRA - Severely motion degraded head MRA. Persistent patency of the revascularized right ICA. No flow limiting proximal stenosis identified.   CT head repeat 5/11 R insular infarct stable. Residual small SAH and small IVH.  CT head repeat 5/13 evolving R MCA infarct, SAH unchanged   CT 5/30 SAH resolved.   2D Echo EF 35-40%. No source of embolus   LDL 90  HgbA1c 5.8  SCDs for VTE prophylaxis  Eliquis (apixaban) daily prior to admission, now on Brilinta (ticagrelor) 90 mg bid and Eliquis (apixaban) daily. Continue brilinta for total 3 months and then switch to plavix and continue  Eliquis (per Dr. Estanislado Pandy).    Therapy recommendations:  CIR - > pt w/o family support at home. Will need SNF  Disposition:  SNF  Plan for d/c on monday 6/7- quarantine bed will be available then - ordered COVID test for tomorrow morning.  He did not get COVID vaccine, requiring a quarantine room in SNF  Acute hypoxemic respiratory failure, resolved Likely Aspiration PNA, treated  Extubated w/ CCM signing off 5/8  Vomiting x 5 with possible aspiration requiring NRB 5/10. CCM reconsulted. Transferred to ICU and Intubated  Extubated 5/11 w/o difficulties  Unasyn 5/10>>5/17 for 7 days  Leukocytosis - resolved  CXR - slight improvement aeration LLL w/ atx/infiltrate. Cardiomegaly.   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 Hold AC given post IR SAH and need for DAPT post stent placement. PEG Monday. Repeat head CT after PEG (Monday or Tuesday) morning and consider restarting eliquis. . Bradycardia resolved off sedation . On metoprolol 12.5 bid w/ tachycardia past 24h - increase metoprolol to 25 bid  . Resumed eliquis since [facility] has resolved and PEG has placed   Hypertension  Home meds:  Hydralazine 50 tid, metoprolol 25 bid  On Hydralazine 25 mg Q8 and metoprolol 25 bid . SBP 120s, on the low end  . Long-term BP goal normotensive  Hyperlipidemia  Home meds:  No statin  LDL 90, goal < 70  May consider statin again ->pt adamantly has refused statins in the past  AKI on CKD  Stage III  Cre 1.29->1.26->1.24->1.29->1.35->1.23->1.27->1.29->1.34->1.30  Renal US R w/ increased echogenicity. 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 Secondary to stroke . SLP difficulty d/t pt participation. . D1 puree w/ honey thick liquids -> pt not swallowing and eating enough for daily needs -> Back to continuous feeding -> restarted TFs t a lower rate due to vomiting and persistent diarrhge Sunday 5/30 . S/p PEG 08/04/19, on TF @ 60 and FW 150 Q4 . Remove sutures 6/8 . Speech and dietician on board  Other Stroke Risk Factors  Advanced age  Coronary artery disease  Chronic systolic Congestive heart failure  Other Active Problems  GERD on PPI   BPH on flomax PTA  Prostate cancer   Hypokalemia - resolved   LBBB. Appears old. EKG neg ischemia. Trops 27->32  (Likely strain related)  Small blood clots in loose stool with recurrence. (No indication for colonoscopy) Hgb 11.7->9.6->9.7->9.6->9.7->10.0->9.8->10.2  Loose stool on imodium scheduled  Thrombocytosis - resolved  Hospital Day #30   [name], MD PhD Stroke Neurology 08/08/2019 5:05 PM    To contact Stroke Continuity provider, please refer to [email or web address] After hours, contact General Neurology

## 2019-08-08 NOTE — Progress Notes (Signed)
Called by the patient's RN regarding bloody stools. He had a large bloody bowel movement. Currently on Eliquis and Brilinta status post carotid angioplasty and stenting. Stroke team aware of his GI bleeding/clotting in stools and is monitoring H&H carefully. Today, I did look at the bowels that were used to clear his bloody stools and it look like a significant amount of bleed.  Recommendations: -I will hold the Eliquis for now -Continue Brilinta given his stent -Check H&H every 6 hours for the next 2 times. -Continue PPI -I would hold his tube feeds for now  Stroke team to follow -- Amie Portland, MD [facility] Pager: [phone] If 7pm to 7am, please call on call as listed on [facility].

## 2019-08-09 LAB — BASIC METABOLIC PANEL
Anion gap: 11 (ref 5–15)
BUN: 38 mg/dL — ABNORMAL HIGH (ref 8–23)
CO2: 24 mmol/L (ref 22–32)
Calcium: 8.8 mg/dL — ABNORMAL LOW (ref 8.9–10.3)
Chloride: 104 mmol/L (ref 98–111)
Creatinine, Ser: 1.44 mg/dL — ABNORMAL HIGH (ref 0.61–1.24)
GFR calc Af Amer: 54 mL/min — ABNORMAL LOW (ref >60)
GFR calc non Af Amer: 46 mL/min — ABNORMAL LOW (ref >60)
Glucose, Bld: 125 mg/dL — ABNORMAL HIGH (ref 70–99)
Potassium: 4.2 mmol/L (ref 3.5–5.1)
Sodium: 139 mmol/L (ref 135–145)

## 2019-08-09 LAB — GLUCOSE, CAPILLARY
Glucose-Capillary: 117 mg/dL — ABNORMAL HIGH (ref 70–99)
Glucose-Capillary: 120 mg/dL — ABNORMAL HIGH (ref 70–99)
Glucose-Capillary: 121 mg/dL — ABNORMAL HIGH (ref 70–99)
Glucose-Capillary: 128 mg/dL — ABNORMAL HIGH (ref 70–99)
Glucose-Capillary: 141 mg/dL — ABNORMAL HIGH (ref 70–99)

## 2019-08-09 LAB — CBC
HCT: 30.3 % — ABNORMAL LOW (ref 39.0–52.0)
Hemoglobin: 9.7 g/dL — ABNORMAL LOW (ref 13.0–17.0)
MCH: 30 pg (ref 26.0–34.0)
MCHC: 32 g/dL (ref 30.0–36.0)
MCV: 93.8 fL (ref 80.0–100.0)
Platelets: 427 10*3/uL — ABNORMAL HIGH (ref 150–400)
RBC: 3.23 MIL/uL — ABNORMAL LOW (ref 4.22–5.81)
RDW: 14.6 % (ref 11.5–15.5)
WBC: 11.8 10*3/uL — ABNORMAL HIGH (ref 4.0–10.5)
nRBC: 0 % (ref 0.0–0.2)

## 2019-08-09 LAB — HEMOGLOBIN AND HEMATOCRIT, BLOOD
HCT: 32.2 % — ABNORMAL LOW (ref 39.0–52.0)
Hemoglobin: 10.3 g/dL — ABNORMAL LOW (ref 13.0–17.0)

## 2019-08-09 LAB — SARS CORONAVIRUS 2 (TAT 6-24 HRS): SARS Coronavirus 2: NEGATIVE

## 2019-08-09 MED ORDER — HALOPERIDOL LACTATE 5 MG/ML IJ SOLN
1.0000 mg | Freq: Four times a day (QID) | INTRAMUSCULAR | Status: DC | PRN
  Administered 2019-08-09 (×2): 1 mg via INTRAVENOUS
  Filled 2019-08-09 (×2): qty 1

## 2019-08-09 NOTE — Progress Notes (Signed)
STROKE TEAM PROGRESS NOTE   INTERVAL HISTORY  Patient is sitting up in bed.  He still intermittently agitated.  He says he wants to go home and meet his wife.  He had bloody stools yesterday but hematocrit was stable.  Today's follow-up CBC from this morning is pending.  Vital signs are stable.  Eliquis is on hold.  Present as continued.  He is getting CBC checked every 6 hourly but patient refusing labs..had 1 bloody stool this am.  Vitals:   08/08/19 1928 08/08/19 2331 08/09/19 0326 08/09/19 0910  BP: 139/85 134/68 139/70 (!) 141/74  Pulse: 94 70 77 (!) 102  Resp: 18 18 18 20   Temp: 98.3 F (36.8 C) 98 F (36.7 C) 98.3 F (36.8 C) 97.7 F (36.5 C)  TempSrc: Oral Oral Oral Axillary  SpO2: 98% 94% 95% 97%  Weight:   80.5 kg   Height:       CBC:  Recent Labs  Lab 08/07/19 0455 08/07/19 0455 08/08/19 0632 08/08/19 2348  WBC 11.8*  --  10.2  --   HGB 9.8*   < > 10.2* 10.3*  HCT 30.7*   < > 31.9* 32.2*  MCV 95.0  --  94.4  --   PLT 374  --  406*  --    < > = values in this interval not displayed.   Basic Metabolic Panel:  Recent Labs  Lab 08/07/19 0455 08/08/19 0632  NA 138 136  K 4.3 4.3  CL 100 100  CO2 27 26  GLUCOSE 122* 119*  BUN 28* 30*  CREATININE 1.34* 1.30*  CALCIUM 8.6* 8.6*    IMAGING past 24 hours No results found.   PHYSICAL EXAM     Temp:  [97.7 F (36.5 C)-98.4 F (36.9 C)] 97.7 F (36.5 C) (06/06 0910) Pulse Rate:  [70-102] 102 (06/06 0910) Resp:  [18-20] 20 (06/06 0910) BP: (134-146)/(68-85) 141/74 (06/06 0910) SpO2:  [94 %-98 %] 97 % (06/06 0910) Weight:  [80.5 kg] 80.5 kg (06/06 0326)  General - thin, well developed elderly caucasian male, not in acute distress  Ophthalmologic - fundi not visualized due to noncooperation.  Cardiovascular - Regular rhythm and rate, tachycardia resolved.  Neuro - awake alert, oriented x2, not to time, he follows simple commands, mild dysarthria, able to name and repeat, but psychomotor slowing, blinks  to threat bilaterally, extraocular muscles intact, pupils appear equally round and reactive to light, left lower facial weakness however he reports sensation is intact in his face, tongue midline. BUE 4/5. BLE 3/5 proximal and distal. DTR 1+ and no babinski. Sensation intact subjectively, FTN not cooperative today. Gait not tested.   ASSESSMENT/PLAN Stephen Copeland is a 79 y.o. male with history of Afib, systolic CHF, HLD, CAD, HTN, prostate cancer presenting with left sided weakness, facial droop, dysarthria.   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 embolic secondary to known AF even on Eliquis  Code Stroke CT head evolving R MCA territory infarct (insula, cerebral cortex). hyperdense R M2 +/- M3. ASPECTS 7    CTA head & neck +LVO. Probable ruptured plaque R ICA bifurcation w/ near occlusive stenosis, possible superimposed dissection. Distal reconstitution R M1 from collaterals but w/ distal embolic occlusion proximal R M2/3 superior division branch. L ICA bifurcation and siphon atherosclerosis. B VA origins 50% stenoses.  R subclavian artery origin 60% stenosis.   CT perfusion 19cc core infarct R insula and posterior frontal. 33cc penumbra.   Cerebral angio TICI3 revascularization occluded middle trifurcation R MCA branch, revascularization R ICA occlusion w/ angioplasty and stenting.   Post IR CT contrast stain R putamen and moderate R sylvian fissure, SAH + contrast.    CT head R sylvian fissure SAH. Increased contrast vs hemorrhage R suprasellar cistern following ICA concerned for increased SAH. R sulci effacement.   Carotid Doppler  R ICA stent patent  MRI  Patchy R MCA infarcts. SAH R sylvian fissure. Small IVH.   MRA - Severely motion degraded head MRA. Persistent patency of the revascularized right ICA. No flow limiting proximal  stenosis identified.   CT head repeat 5/11 R insular infarct stable. Residual small SAH and small IVH.  CT head repeat 5/13 evolving R MCA infarct, SAH unchanged   CT 5/30 SAH resolved.   2D Echo EF 35-40%. No source of embolus   LDL 90  HgbA1c 5.8  SCDs for VTE prophylaxis  Eliquis (apixaban) daily prior to admission, now on Brilinta (ticagrelor) 90 mg bid and Eliquis (apixaban) daily. Continue brilinta for total 3 months and then switch to plavix and continue Eliquis (per Dr. Estanislado Pandy).    Therapy recommendations:  CIR - > pt w/o family support at home. Will need SNF  Disposition:  SNF  Plan for d/c on monday 6/7- quarantine bed will be available then - ordered COVID test for Sunday morning.  He did not get COVID vaccine, requiring a quarantine room in SNF  Acute hypoxemic respiratory failure, resolved Likely Aspiration PNA, treated  Extubated w/ CCM signing off 5/8  Vomiting x 5 with possible aspiration requiring NRB 5/10. CCM reconsulted. Transferred to ICU and Intubated  Extubated 5/11 w/o difficulties  Unasyn 5/10>>5/17 for 7 days  Leukocytosis - resolved  CXR - slight improvement aeration LLL w/ atx/infiltrate. Cardiomegaly.   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 Hold AC given post IR SAH and need for DAPT post stent placement. PEG Monday. Repeat head CT after PEG (Monday or Tuesday) morning and consider restarting eliquis. . Bradycardia resolved off sedation . On metoprolol 12.5 bid w/ tachycardia past 24h - increase metoprolol to 25 bid  . Resumed eliquis since [facility] has resolved and PEG has placed   Hypertension  Home meds:   Hydralazine 50 tid, metoprolol 25 bid  On Hydralazine 25 mg Q8 and metoprolol 25 bid . SBP 120s, on the low end  . Long-term BP goal normotensive  Hyperlipidemia  Home meds:  No statin  LDL 90, goal < 70  May consider statin again ->pt adamantly has refused statins in the past  AKI on CKD  Stage III  Cre 1.29->1.26->1.24->1.29->1.35->1.23->1.27->1.29->1.34->1.30  Renal US R w/ increased echogenicity. 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 Secondary to stroke . SLP difficulty d/t pt participation. . D1 puree w/ honey thick liquids -> pt not swallowing and eating enough for daily needs -> Back to continuous feeding -> restarted TFs t a lower rate due to vomiting and persistent diarrhge Sunday 5/30 . S/p PEG 08/04/19, on TF @ 60 and FW 150 Q4 . Remove sutures 6/8 . Speech and dietician on board  Other Stroke Risk Factors  Advanced age  Coronary artery disease  Chronic systolic Congestive heart failure  Other Active Problems   BPH on flomax PTA  Prostate cancer   Hypokalemia - resolved   LBBB. Appears old. EKG neg ischemia. Trops 27->32  (Likely strain related)  Thrombocytosis - resolved  GI Issues  Episodes of N, V and diarrhea throughout admission.  Pt did not tolerate CorTrak -> PEG place 08/04/19 (trauma surgeons)  Jevity TF 60 ml / hour with free water 150 mls Q 4 hrs  Abd / Pelvis CT 2018 - Gallstones/hyperdense sludge dependent in the gallbladder. Numerous small stones in the distal common bile duct. No evidence of ductal  dilatation.  Portable Abd 08/02/19 - Less prominent dilatation of small bowel compared to the prior study with probable component of minimal residual ileus  Imodium 2 mg per tube Q 6 hrs for diarrhea since 07/25/19. Small blood clots in loose stool with recurrence. (No indication for colonoscopy)   Protonix 40 mg Bid per tube since 07/19/19 for GERD  Eliquis now on hold per Dr Rory Percy - pt still getting Brilinta  Nurse reports pt has been confused today and has refused meds and lab draws.  Hgb - 11.7->9.6->9.7->9.6->9.7->10.0->9.8->10.2->10.3 - pt refused lab draws today.  WBC's - 11.6->8.4->11.4->11.8->10.2 (afebrile)    Hospital Day #31   Continue serial hematocrit checks and watch for bloody stools.  Use Haldol as needed for agitation.  Hopefully transfer to nursing home early next week.  No family available for discussion.  Greater than 50% time during this 25-minute visit was spent on counseling and coordination of care and discussion with care team. Antony Contras, MD Medical Director [place] Pager: [phone] 08/09/2019 3:47 PM To contact Stroke Continuity provider, please refer to [email or web address] After hours, contact General Neurology

## 2019-08-09 NOTE — Progress Notes (Signed)
Patient refusing to have labs drawn. Nurse educated patient on the importance and reason for blood work. Patient is still refusing. MD made aware. Gwendolyn Grant, RN

## 2019-08-09 NOTE — Progress Notes (Signed)
Patient continuing to attempt to get out of bed. Patient educated on his safety. Patient Still noncompliant. MD paged, awaiting orders. Will continue to monitor and educate on safety.  Gwendolyn Grant, RN

## 2019-08-10 LAB — CBC
HCT: 29.2 % — ABNORMAL LOW (ref 39.0–52.0)
Hemoglobin: 9.5 g/dL — ABNORMAL LOW (ref 13.0–17.0)
MCH: 30.3 pg (ref 26.0–34.0)
MCHC: 32.5 g/dL (ref 30.0–36.0)
MCV: 93 fL (ref 80.0–100.0)
Platelets: 411 10*3/uL — ABNORMAL HIGH (ref 150–400)
RBC: 3.14 MIL/uL — ABNORMAL LOW (ref 4.22–5.81)
RDW: 14.8 % (ref 11.5–15.5)
WBC: 10.7 10*3/uL — ABNORMAL HIGH (ref 4.0–10.5)
nRBC: 0 % (ref 0.0–0.2)

## 2019-08-10 LAB — GLUCOSE, CAPILLARY
Glucose-Capillary: 125 mg/dL — ABNORMAL HIGH (ref 70–99)
Glucose-Capillary: 127 mg/dL — ABNORMAL HIGH (ref 70–99)
Glucose-Capillary: 137 mg/dL — ABNORMAL HIGH (ref 70–99)
Glucose-Capillary: 139 mg/dL — ABNORMAL HIGH (ref 70–99)
Glucose-Capillary: 149 mg/dL — ABNORMAL HIGH (ref 70–99)
Glucose-Capillary: 93 mg/dL (ref 70–99)

## 2019-08-10 LAB — BASIC METABOLIC PANEL
Anion gap: 11 (ref 5–15)
BUN: 38 mg/dL — ABNORMAL HIGH (ref 8–23)
CO2: 25 mmol/L (ref 22–32)
Calcium: 8.8 mg/dL — ABNORMAL LOW (ref 8.9–10.3)
Chloride: 102 mmol/L (ref 98–111)
Creatinine, Ser: 1.48 mg/dL — ABNORMAL HIGH (ref 0.61–1.24)
GFR calc Af Amer: 52 mL/min — ABNORMAL LOW (ref >60)
GFR calc non Af Amer: 45 mL/min — ABNORMAL LOW (ref >60)
Glucose, Bld: 124 mg/dL — ABNORMAL HIGH (ref 70–99)
Potassium: 4.2 mmol/L (ref 3.5–5.1)
Sodium: 138 mmol/L (ref 135–145)

## 2019-08-10 MED ORDER — QUETIAPINE FUMARATE 25 MG PO TABS
25.0000 mg | ORAL_TABLET | Freq: Every day | ORAL | Status: DC
  Administered 2019-08-10 – 2019-08-17 (×8): 25 mg via ORAL
  Filled 2019-08-10 (×8): qty 1

## 2019-08-10 MED ORDER — FREE WATER
200.0000 mL | Status: DC
  Administered 2019-08-10 – 2019-08-13 (×18): 200 mL

## 2019-08-10 NOTE — Plan of Care (Signed)
  Problem: Education: Goal: Knowledge of General Education information will improve Description: Including pain rating scale, medication(s)/side effects and non-pharmacologic comfort measures Outcome: Progressing   Problem: Coping: Goal: Level of anxiety will decrease Outcome: Progressing   Problem: Safety: Goal: Ability to remain free from injury will improve Outcome: Progressing   

## 2019-08-10 NOTE — TOC Progression Note (Addendum)
Transition of Care [facility]) - Progression Note    Patient Details  Name: Stephen Copeland MRN: [id number] Date of Birth: Apr 03, 1940  Transition of Care [facility]) CM/SW [place], [place] Work Phone Number: 08/10/2019, 1:05 PM  Clinical Narrative:     MSW Intern received message this morning from [facility] stating that they are wary to take pt due to his lack of participation in therapy. PT went to see pt again and he did better. Then [facility] called and noted that the wife was requesting long term care for after rehab, and they had no bed availability. SW confirmed bed availability for long term with [facility], which is the next closest facility. Attempted to contact pt's wife to discuss with no answer. SW will continue to follow.  Addendum 08/10/2019 2:16 pm MSW Intern spoke with pt's wife about long term bed options for after rehab. Her options were discussed and questions answered. She stated that she needed to talk to her sister in law before making decisions. SW will speak with facilities when a decision is made.  Expected Discharge Plan: [place] Barriers to Discharge: Continued Medical Work up  Expected Discharge Plan and Services Expected Discharge Plan: [place] arrangements for the past 2 months: Single Family Home                                       Social Determinants of Health (SDOH) Interventions    Readmission Risk Interventions No flowsheet data found.

## 2019-08-10 NOTE — Progress Notes (Signed)
  Speech Language Pathology Treatment: Dysphagia  Patient Details Name: Stephen Copeland MRN: [id number] DOB: 1940/10/24 Today's Date: 08/10/2019 Time: 0051-1021 SLP Time Calculation (min) (ACUTE ONLY): 13 min  Assessment / Plan / Recommendation Clinical Impression  Pt was seen for dysphagia treatment. He was alert throughout the session with some mild improvement in cooperation and speech intelligibility compared to that which was noted during the last session with this SLP. Nursing reported that the pt refused lunch and demonstrated oral holding with breakfast. Per RN, boluses/residue was removed from the pt's mouth this morning with oral swabs. Pt accepted limited trials of puree solids and honey thick liquids. Moderate oral residue was noted and persisted despite palpation of swallows and cued secondary swallows. Oral residue persisted and was ultimately suctioned. Difficulty with bolus formation and/or A-P transport is suspected. Coughing was noted following cued swallowing and attempts to clear the oral cavity, suggesting possible aspiration. SLP will continue to follow pt for dysphagia treatment.    HPI HPI: 79 y/o M admitted 5/6 with right sided CVA and remained intubated post thrombectomy and carotid stent. He was outside the TPA window. Intubated 5/6-5/7, then on 5/10 pt had multiple episodes of vomiting brown gastric contents and was reintubted. Pt was seen by SLP in the interim, found to have acute neurogenic dysphagia with signs of aspiration with minimal PO trials. Has Cortrak, on D1/HTL diet.       SLP Plan  Continue with current plan of care       Recommendations  Diet recommendations: Dysphagia 1 (puree);Honey-thick liquid Liquids provided via: Teaspoon Medication Administration: Via alternative means Supervision: Full supervision/cueing for compensatory strategies;Trained caregiver to feed patient Compensations: Slow rate;Small sips/bites;Minimize environmental  distractions Postural Changes and/or Swallow Maneuvers: Seated upright 90 degrees                Oral Care Recommendations: Oral care QID Follow up Recommendations: Skilled Nursing facility SLP Visit Diagnosis: Dysphagia, oropharyngeal phase (R13.12) Plan: Continue with current plan of care       Stephen Copeland, [place], [place] Office number [phone] Pager [place] 08/10/2019, 5:27 PM

## 2019-08-10 NOTE — Progress Notes (Signed)
Occupational Therapy Treatment Patient Details Name: Stephen Copeland MRN: [id number] DOB: [birth year] Today's Date: 08/10/2019    History of present illness 79 y.o. male admitted on 07/09/19 for L sided weakness, facial droop, and slurred speech.  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  He did not get any tPA as he was outside of the window.  Pt intubated 5/6 for procedure and extubated in the AM of 07/10/19.  Pt with other significant PMH of sinus brady, scoliosis, prostate CA s/p surgery, HTN, dilated cardiomyopathy, CAD, CHF.  Patient re-intubated 5/10 with inability to clear secretions.. Extubated 5/11. PEG placed 6/1.   OT comments  Patient seen in conjunction with PT to optimize pt participation and tolerance.  Patient groggy but participates during session with cueing.  Requires +2 mod-max assist for sit to stand transfers, pt incontinent of bowel during each stand requiring total assist for pericare hygiene once supine to ensure cleanliness.  Fatigues easily and requires increased assist with continued trials of standing. Pt follows simple commands with increased time today.  Patient supine with NT in room assisting with breakfast.    Follow Up Recommendations  SNF;Supervision/Assistance - 24 hour    Equipment Recommendations  Other (comment)(Defer to next venue )    Recommendations for Other Services      Precautions / Restrictions Precautions Precautions: Fall Precaution Comments: PEG Restrictions Weight Bearing Restrictions: No       Mobility Bed Mobility Overal bed mobility: Needs Assistance Bed Mobility: Rolling;Sidelying to Sit;Sit to Sidelying Rolling: Max assist;+2 for physical assistance Sidelying to sit: Max assist;+2 for physical assistance;+2 for safety/equipment     Sit to sidelying: Mod assist General bed mobility comments: patient requires assist to bring BLES and sustain off EOB and support of trunk; returned to sidelying  with support for trunk   Transfers Overall transfer level: Needs assistance Equipment used: Rolling walker (2 wheeled) Transfers: Sit to/from Stand Sit to Stand: Max assist;+2 physical assistance;+2 safety/equipment;Mod assist         General transfer comment: pt prefers to use bil hands on RW, therefore RW anchored and pt assisted to stand (he actually attempted/initiated several times during session); initial stand ~60 seconds, then 15 sec, then 20 sec); responded well to incr alertness to use loud voice "Stand up!" vs counting and rocking; increased assist with trials, fatigues easily    Balance Overall balance assessment: Needs assistance Sitting-balance support: Feet supported;Bilateral upper extremity supported Sitting balance-Leahy Scale: Poor Sitting balance - Comments: min guard to min assist for EOB balance, B hands on RW    Standing balance support: Bilateral upper extremity supported;During functional activity Standing balance-Leahy Scale: Poor Standing balance comment: relies on external and BUE support                            ADL either performed or assessed with clinical judgement   ADL Overall ADL's : Needs assistance/impaired                 Upper Body Dressing : Moderate assistance;Bed level Upper Body Dressing Details (indicate cue type and reason): to change gown         Toileting- Clothing Manipulation and Hygiene: Total assistance;+2 for physical assistance;+2 for safety/equipment;Bed level;Sit to/from stand Toileting - Clothing Manipulation Details (indicate cue type and reason): incontient of bowel with each stand, transitioned to supine to complete hygiene with total assist      Functional  mobility during ADLs: Maximal assistance;+2 for physical assistance;+2 for safety/equipment;Rolling walker;Cueing for sequencing;Cueing for safety General ADL Comments: patient remains limited by cognition, weakness, activity tolerance and bowel  incontinence      Vision       Perception     Praxis      Cognition Arousal/Alertness: Lethargic Behavior During Therapy: Flat affect Overall Cognitive Status: Impaired/Different from baseline Area of Impairment: Attention;Memory;Following commands;Safety/judgement;Problem solving;Awareness                 Orientation Level: (not tested) Current Attention Level: Sustained Memory: Decreased short-term memory Following Commands: Follows one step commands inconsistently Safety/Judgement: Decreased awareness of safety;Decreased awareness of deficits Awareness: Intellectual Problem Solving: Decreased initiation;Requires verbal cues;Requires tactile cues;Slow processing;Difficulty sequencing General Comments: pt with poor awareness of situation, requires simple 1 step commands with increased time to engage in ADLs and mobility        Exercises     Shoulder Instructions       General Comments      Pertinent Vitals/ Pain       Pain Assessment: Faces Faces Pain Scale: Hurts little more Pain Location: with wiping his bottom Pain Descriptors / Indicators: Guarding;Grimacing Pain Intervention(s): Limited activity within patient's tolerance;Monitored during session;Repositioned  Home Living                                          Prior Functioning/Environment              Frequency  Min 2X/week        Progress Toward Goals  OT Goals(current goals can now be found in the care plan section)  Progress towards OT goals: Progressing toward goals  Acute Rehab OT Goals Patient Stated Goal: less pain  OT Goal Formulation: With patient  Plan Discharge plan remains appropriate;Frequency remains appropriate    Co-evaluation    PT/OT/SLP Co-Evaluation/Treatment: Yes Reason for Co-Treatment: Complexity of the patient's impairments (multi-system involvement);Necessary to address cognition/behavior during functional activity;For patient/therapist  safety;To address functional/ADL transfers PT goals addressed during session: Mobility/safety with mobility;Balance;Proper use of DME;Strengthening/ROM OT goals addressed during session: ADL's and self-care      AM-PAC OT "6 Clicks" Daily Activity     Outcome Measure   Help from another person eating meals?: A Lot Help from another person taking care of personal grooming?: A Lot Help from another person toileting, which includes using toliet, bedpan, or urinal?: Total Help from another person bathing (including washing, rinsing, drying)?: A Lot Help from another person to put on and taking off regular upper body clothing?: Total Help from another person to put on and taking off regular lower body clothing?: Total 6 Click Score: 9    End of Session Equipment Utilized During Treatment: Oxygen  OT Visit Diagnosis: Unsteadiness on feet (R26.81);Other abnormalities of gait and mobility (R26.89);Muscle weakness (generalized) (M62.81);Pain;Other symptoms and signs involving cognitive function Pain - part of body: (bottom)   Activity Tolerance Patient tolerated treatment well   Patient Left in bed;with call bell/phone within reach;with bed alarm set;with nursing/sitter in room   Nurse Communication Mobility status        Time: 7564-3329 OT Time Calculation (min): 23 min  Charges: OT General Charges $OT Visit: 1 Visit OT Treatments $Self Care/Home Management : 8-22 mins  Jolaine Artist, OT [place] Pager [phone] Office [phone]    Delight Stare 08/10/2019,  1:20 PM

## 2019-08-10 NOTE — Progress Notes (Signed)
Physical Therapy Treatment Patient Details Name: Stephen Copeland MRN: [id number] DOB: June 16, 1940 Today's Date: 08/10/2019    History of Present Illness 79 y.o. male admitted on 07/09/19 for L sided weakness, facial droop, and slurred speech.  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  He did not get any tPA as he was outside of the window.  Pt intubated 5/6 for procedure and extubated in the AM of 07/10/19.  Pt with other significant PMH of sinus brady, scoliosis, prostate CA s/p surgery, HTN, dilated cardiomyopathy, CAD, CHF.  Patient re-intubated 5/10 with inability to clear secretions.. Extubated 5/11. PEG placed 6/1.    PT Comments    Pt groggy, but with increasing alertness throughout session. He cooperated with (and even initiated) sit to stand x 3 reps (varied 10-60 seconds, +2 mod to +2 max assist). Used RW and allowed pt to begin with both hands on RW with therapist "anchoring" walker and pt actively pulling to stand. Pt became incontinent of stool and at this point too tired to stand long enough for pericare. Returned to bed for pericare and NT in to assist. Breakfast tray arrived and pt asking to eat with NT to assist.     Follow Up Recommendations  SNF     Equipment Recommendations  Other (comment)(TBA next venue)    Recommendations for Other Services       Precautions / Restrictions Precautions Precautions: Fall Precaution Comments: PEG Restrictions Weight Bearing Restrictions: No    Mobility  Bed Mobility Overal bed mobility: Needs Assistance Bed Mobility: Rolling;Sidelying to Sit;Sit to Sidelying Rolling: Max assist;+2 for physical assistance Sidelying to sit: Max assist;+2 for physical assistance;+2 for safety/equipment     Sit to sidelying: Mod assist General bed mobility comments: pt groggy, but awake; on his left side and assisted to sitting with incr alertness; after multiple sit to stand, remained EOB patiently while linens  prepared for return to bed; assist for LUE position and raising second leg up onto bed (pt cleared first leg himself with encouragement)  Transfers Overall transfer level: Needs assistance Equipment used: Rolling walker (2 wheeled) Transfers: Sit to/from Stand Sit to Stand: Max assist;+2 physical assistance;+2 safety/equipment;Mod assist         General transfer comment: pt prefers to use bil hands on RW, therefore RW anchored and pt assisted to stand (he actually attempted/initiated several times during session); initial stand ~60 seconds, then 15 sec, then 20 sec); responded well to incr alertness to use loud voice "Stand up!" vs counting and rocking  Ambulation/Gait             General Gait Details: unable at this time due to weakness and incontinence   Stairs             Wheelchair Mobility    Modified Rankin (Stroke Patients Only) Modified Rankin (Stroke Patients Only) Pre-Morbid Rankin Score: No symptoms Modified Rankin: Severe disability     Balance Overall balance assessment: Needs assistance Sitting-balance support: Feet supported Sitting balance-Leahy Scale: Poor Sitting balance - Comments: once with feet on the floor, min guard assist to maintain balance with bil hands on RW   Standing balance support: Bilateral upper extremity supported;During functional activity Standing balance-Leahy Scale: Poor Standing balance comment: relies on external and BUE support                             Cognition Arousal/Alertness: Lethargic Behavior During Therapy: Flat  affect Overall Cognitive Status: Impaired/Different from baseline Area of Impairment: Attention;Memory;Following commands;Safety/judgement;Problem solving;Awareness                 Orientation Level: (not tested) Current Attention Level: Sustained Memory: Decreased short-term memory Following Commands: Follows one step commands inconsistently Safety/Judgement: Decreased  awareness of safety;Decreased awareness of deficits Awareness: Intellectual Problem Solving: Decreased initiation;Requires verbal cues;Requires tactile cues;Slow processing General Comments: pt following single step instructions for basic mobility      Exercises      General Comments        Pertinent Vitals/Pain Pain Assessment: Faces Faces Pain Scale: Hurts little more Pain Location: with wiping his bottom Pain Descriptors / Indicators: Guarding;Grimacing Pain Intervention(s): Limited activity within patient's tolerance;Monitored during session    Home Living                      Prior Function            PT Goals (current goals can now be found in the care plan section) Acute Rehab PT Goals Patient Stated Goal: less pain  Time For Goal Achievement: 08/19/19 Potential to Achieve Goals: Fair Progress towards PT goals: Progressing toward goals(pt cooperative throughout)    Frequency    Min 3X/week      PT Plan Current plan remains appropriate    Co-evaluation PT/OT/SLP Co-Evaluation/Treatment: Yes Reason for Co-Treatment: Complexity of the patient's impairments (multi-system involvement);Necessary to address cognition/behavior during functional activity;For patient/therapist safety;To address functional/ADL transfers PT goals addressed during session: Mobility/safety with mobility;Balance;Proper use of DME;Strengthening/ROM        AM-PAC PT "6 Clicks" Mobility   Outcome Measure  Help needed turning from your back to your side while in a flat bed without using bedrails?: A Lot Help needed moving from lying on your back to sitting on the side of a flat bed without using bedrails?: A Lot Help needed moving to and from a bed to a chair (including a wheelchair)?: Total Help needed standing up from a chair using your arms (e.g., wheelchair or bedside chair)?: Total Help needed to walk in hospital room?: Total Help needed climbing 3-5 steps with a railing?  : Total 6 Click Score: 8    End of Session Equipment Utilized During Treatment: Gait belt Activity Tolerance: Patient limited by fatigue Patient left: in bed;with call bell/phone within reach;with nursing/sitter in room;with restraints reapplied(bil mitts; NT made aware SCD is broken) Nurse Communication: Mobility status PT Visit Diagnosis: Muscle weakness (generalized) (M62.81);Other symptoms and signs involving the nervous system (R29.898);Hemiplegia and hemiparesis Hemiplegia - Right/Left: Left Hemiplegia - dominant/non-dominant: Non-dominant Hemiplegia - caused by: Cerebral infarction     Time: 7482-7078 PT Time Calculation (min) (ACUTE ONLY): 23 min  Charges:  $Therapeutic Activity: 8-22 mins                      Arby Barrette, PT Pager [phone]    Rexanne Mano 08/10/2019, 10:32 AM

## 2019-08-10 NOTE — Progress Notes (Signed)
STROKE TEAM PROGRESS NOTE   INTERVAL HISTORY  Patient is sitting up in bed.  He was agitated last pm and got haldol and seems sleepy this am... He has not had any bloody stools this morning and had clear stools.  Hematocrit is stable at 29.2.  BUN and sodium is slightly elevated.  Vital signs are stable.  Eliquis was on hold.   Vital signs are stable  Vitals:   08/10/19 0336 08/10/19 0851 08/10/19 1043 08/10/19 1210  BP: 140/70 128/83  103/63  Pulse: 69 (!) 57 64 (!) 57  Resp: 18   18  Temp: 98.5 F (36.9 C) (!) 97.3 F (36.3 C)  97.9 F (36.6 C)  TempSrc: Oral Axillary  Oral  SpO2: 96% 95%  100%  Weight:      Height:       CBC:  Recent Labs  Lab 08/09/19 1604 08/10/19 0321  WBC 11.8* 10.7*  HGB 9.7* 9.5*  HCT 30.3* 29.2*  MCV 93.8 93.0  PLT 427* 811*   Basic Metabolic Panel:  Recent Labs  Lab 08/09/19 1604 08/10/19 0321  NA 139 138  K 4.2 4.2  CL 104 102  CO2 24 25  GLUCOSE 125* 124*  BUN 38* 38*  CREATININE 1.44* 1.48*  CALCIUM 8.8* 8.8*    IMAGING past 24 hours No results found.   PHYSICAL EXAM     Temp:  [97.3 F (36.3 C)-98.5 F (36.9 C)] 97.9 F (36.6 C) (06/07 1210) Pulse Rate:  [57-69] 57 (06/07 1210) Resp:  [18-20] 18 (06/07 1210) BP: (103-149)/(63-83) 103/63 (06/07 1210) SpO2:  [95 %-100 %] 100 % (06/07 1210)  General - thin, well developed elderly caucasian male, not in acute distress  Ophthalmologic - fundi not visualized due to noncooperation.  Cardiovascular - Regular rhythm and rate, tachycardia resolved.  Neuro -sleepy but can be aroused with some difficulty, he follows simple commands, mild dysarthria, able to name and repeat, but psychomotor slowing, blinks to threat bilaterally, extraocular muscles intact, pupils appear equally round and reactive to light, left lower facial weakness however he reports sensation is intact in his face, tongue midline. BUE 4/5. BLE 3/5 proximal and distal. DTR 1+ and no babinski. Sensation intact  subjectively, FTN not cooperative today. Gait not tested.   ASSESSMENT/PLAN Stephen Copeland is a 79 y.o. male with history of Afib, systolic CHF, HLD, CAD, HTN, prostate cancer presenting with left sided weakness, facial droop, dysarthria.   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 embolic secondary to known AF even on Eliquis  Code Stroke CT head evolving R MCA territory infarct (insula, cerebral cortex). hyperdense R M2 +/- M3. ASPECTS 7    CTA head & neck +LVO. Probable ruptured plaque R ICA bifurcation w/ near occlusive stenosis, possible superimposed dissection. Distal reconstitution R M1 from collaterals but w/ distal embolic occlusion proximal R M2/3 superior division branch. L ICA bifurcation and siphon atherosclerosis. B VA origins 50% stenoses. R subclavian artery origin 60% stenosis.   CT perfusion 19cc core infarct R insula and posterior frontal. 33cc penumbra.   Cerebral angio TICI3 revascularization occluded middle trifurcation R MCA branch, revascularization R ICA occlusion w/ angioplasty and stenting.   Post IR CT contrast stain R putamen and moderate R sylvian fissure, SAH + contrast.    CT head R sylvian fissure SAH.  Increased contrast vs hemorrhage R suprasellar cistern following ICA concerned for increased SAH. R sulci effacement.   Carotid Doppler  R ICA stent patent  MRI  Patchy R MCA infarcts. SAH R sylvian fissure. Small IVH.   MRA - Severely motion degraded head MRA. Persistent patency of the revascularized right ICA. No flow limiting proximal stenosis identified.   CT head repeat 5/11 R insular infarct stable. Residual small SAH and small IVH.  CT head repeat 5/13 evolving R MCA infarct, SAH unchanged   CT 5/30 SAH resolved.   2D Echo EF 35-40%. No source of embolus   LDL 90  HgbA1c 5.8  SCDs for VTE  prophylaxis  Eliquis (apixaban) daily prior to admission, now on Brilinta (ticagrelor) 90 mg bid and Eliquis (apixaban) daily. Continue brilinta for total 3 months and then switch to plavix and continue Eliquis (per Dr. Estanislado Pandy).    Therapy recommendations:  CIR - > pt w/o family support at home. Will need SNF  Disposition:  SNF  Plan for d/c on monday 6/7- quarantine bed will be available then - ordered COVID test for Sunday morning.  He did not get COVID vaccine, requiring a quarantine room in SNF  Acute hypoxemic respiratory failure, resolved Likely Aspiration PNA, treated  Extubated w/ CCM signing off 5/8  Vomiting x 5 with possible aspiration requiring NRB 5/10. CCM reconsulted. Transferred to ICU and Intubated  Extubated 5/11 w/o difficulties  Unasyn 5/10>>5/17 for 7 days  Leukocytosis - resolved  CXR - slight improvement aeration LLL w/ atx/infiltrate. Cardiomegaly.   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 Hold AC given post IR SAH and need for DAPT post stent placement. PEG Monday. Repeat head CT after PEG (Monday or Tuesday) morning and consider restarting eliquis. . Bradycardia resolved off sedation . On metoprolol 12.5 bid w/ tachycardia past 24h - increase metoprolol to 25 bid  . Resumed eliquis since [facility] has resolved and PEG has placed   Hypertension  Home meds:  Hydralazine 50 tid, metoprolol 25 bid  On Hydralazine 25 mg Q8 and metoprolol 25 bid . SBP 120s, on the low end  . Long-term BP goal normotensive  Hyperlipidemia  Home meds:  No statin  LDL 90, goal < 70  May consider statin again ->pt adamantly has refused statins in  the past  AKI on CKD  Stage III  Cre 1.29->1.26->1.24->1.29->1.35->1.23->1.27->1.29->1.34->1.30  Renal US R w/ increased echogenicity. 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 Secondary to stroke . SLP difficulty d/t pt participation. . D1 puree w/ honey thick liquids -> pt not swallowing and eating enough for daily needs -> Back to continuous feeding -> restarted TFs t a  lower rate due to vomiting and persistent diarrhge Sunday 5/30 . S/p PEG 08/04/19, on TF @ 60 and FW 150 Q4 . Remove sutures 6/8 . Speech and dietician on board  Other Stroke Risk Factors  Advanced age  Coronary artery disease  Chronic systolic Congestive heart failure  Other Active Problems   BPH on flomax PTA  Prostate cancer   Hypokalemia - resolved   LBBB. Appears old. EKG neg ischemia. Trops 27->32  (Likely strain related)  Thrombocytosis - resolved  GI Issues  Episodes of N, V and diarrhea throughout admission.  Pt did not tolerate CorTrak -> PEG place 08/04/19 (trauma surgeons)  Jevity TF 60 ml / hour with free water 150 mls Q 4 hrs  Abd / Pelvis CT 2018 - Gallstones/hyperdense sludge dependent in the gallbladder. Numerous small stones in the distal common bile duct. No evidence of ductal dilatation.  Portable Abd 08/02/19 - Less prominent dilatation of small bowel compared to the prior study with probable component of minimal residual ileus  Imodium 2 mg per tube Q 6 hrs for diarrhea since 07/25/19. Small blood clots in loose stool with recurrence. (No indication for  colonoscopy)   Protonix 40 mg Bid per tube since 07/19/19 for GERD  Eliquis now on hold per Dr Rory Percy - pt still getting Brilinta  Nurse reports pt has been confused today and has refused meds and lab draws.  Hgb - 11.7->9.6->9.7->9.6->9.7->10.0->9.8->10.2->10.3 - pt refused lab draws today.  WBC's - 11.6->8.4->11.4->11.8->10.2 (afebrile)    Hospital Day #31   Resume Eliquis and tube feeds as hematocrit is stable and bloody diarrhea has stopped.  Discontinue Haldol as it is making him too sleepy and instead increase nighttime dose of Seroquel   for agitation.  Hopefully transfer to nursing home early next week.  No family available for discussion.  Discussed with patient and Education officer, museum.  Greater than 50% time during this 25-minute visit was spent on counseling and coordination of care and discussion with care team. Stephen Contras, MD Medical Director [place] Pager: [phone] 08/10/2019 12:42 PM To contact Stroke Continuity provider, please refer to [email or web address] After hours, contact General Neurology

## 2019-08-11 LAB — CBC
HCT: 28.2 % — ABNORMAL LOW (ref 39.0–52.0)
Hemoglobin: 9 g/dL — ABNORMAL LOW (ref 13.0–17.0)
MCH: 30.5 pg (ref 26.0–34.0)
MCHC: 31.9 g/dL (ref 30.0–36.0)
MCV: 95.6 fL (ref 80.0–100.0)
Platelets: 477 10*3/uL — ABNORMAL HIGH (ref 150–400)
RBC: 2.95 MIL/uL — ABNORMAL LOW (ref 4.22–5.81)
RDW: 15.1 % (ref 11.5–15.5)
WBC: 8.3 10*3/uL (ref 4.0–10.5)
nRBC: 0 % (ref 0.0–0.2)

## 2019-08-11 LAB — GLUCOSE, CAPILLARY
Glucose-Capillary: 112 mg/dL — ABNORMAL HIGH (ref 70–99)
Glucose-Capillary: 134 mg/dL — ABNORMAL HIGH (ref 70–99)
Glucose-Capillary: 88 mg/dL (ref 70–99)
Glucose-Capillary: 132 mg/dL — ABNORMAL HIGH (ref 70–99)
Glucose-Capillary: 84 mg/dL (ref 70–99)
Glucose-Capillary: 127 mg/dL — ABNORMAL HIGH (ref 70–99)

## 2019-08-11 LAB — BASIC METABOLIC PANEL
Anion gap: 11 (ref 5–15)
BUN: 42 mg/dL — ABNORMAL HIGH (ref 8–23)
CO2: 25 mmol/L (ref 22–32)
Calcium: 8.5 mg/dL — ABNORMAL LOW (ref 8.9–10.3)
Chloride: 102 mmol/L (ref 98–111)
Creatinine, Ser: 1.68 mg/dL — ABNORMAL HIGH (ref 0.61–1.24)
GFR calc Af Amer: 44 mL/min — ABNORMAL LOW (ref >60)
GFR calc non Af Amer: 38 mL/min — ABNORMAL LOW (ref >60)
Glucose, Bld: 122 mg/dL — ABNORMAL HIGH (ref 70–99)
Potassium: 3.8 mmol/L (ref 3.5–5.1)
Sodium: 138 mmol/L (ref 135–145)

## 2019-08-11 NOTE — Progress Notes (Signed)
STROKE TEAM PROGRESS NOTE   INTERVAL HISTORY  Pt with large bright red blood clot with stool this am, not mixed with stool. HGB stable. eliquis put on hold. Will hold d/c to SNF today and continue to monitor.  Patient is sitting up in bed and talking to his wife on the phone.  He has no complaints.  Vitals:   08/12/19 0603 08/12/19 0734 08/12/19 1116 08/12/19 1512  BP: 128/80 (!) 113/54 126/74 106/73  Pulse: 82 71 (!) 59 69  Resp: 18 18 18 18   Temp: 98.2 F (36.8 C) 98.2 F (36.8 C) 97.7 F (36.5 C) 97.9 F (36.6 C)  TempSrc: Oral Oral Oral Oral  SpO2: 99% 98% 98% 99%  Weight:      Height:       CBC:  Recent Labs  Lab 08/11/19 0537 08/12/19 0347  WBC 8.3 7.6  HGB 9.0* 9.0*  HCT 28.2* 27.9*  MCV 95.6 94.6  PLT 477* 413*   Basic Metabolic Panel:  Recent Labs  Lab 08/11/19 0537 08/12/19 0347  NA 138 140  K 3.8 4.0  CL 102 105  CO2 25 26  GLUCOSE 122* 110*  BUN 42* 39*  CREATININE 1.68* 1.65*  CALCIUM 8.5* 8.7*    IMAGING past 24 hours No results found.   PHYSICAL EXAM      General - thin, well developed elderly caucasian male, not in acute distress  Ophthalmologic - fundi not visualized due to noncooperation.  Cardiovascular - Regular rhythm and rate, tachycardia resolved.  Neuro - awake alert, oriented x2, not to time, he follows simple commands, mild dysarthria, able to name and repeat, but psychomotor slowing, blinks to threat bilaterally, extraocular muscles intact, pupils appear equally round and reactive to light, left lower facial weakness however he reports sensation is intact in his face, tongue midline. BUE4/5. BLE3/5 proximal and distal. DTR 1+ and no babinski. Sensation intact subjectively, FTN not cooperative today. Gait not tested.  ASSESSMENT/PLAN Stephen Copeland is a 79 y.o. male with history of Afib, systolic CHF, HLD, CAD, HTN, prostate cancer presenting with left sided weakness, facial droop, dysarthria.   Stroke:   Patchy R MCA  infarcts due to right ICA and M2 occlusion, s/p IR w/ M2/3 TICI3 revascularization and R ICA stent with small SAH & IVH - infarct likely right ICA atherosclerosis vs. embolic secondary to known AF even on Eliquis  Eliquis for VTE prophylaxis  Eliquis (apixaban) daily prior to admission, now on Brilinta (ticagrelor) 90 mg bid. Continue brilinta for total 3 months and then switch to plavix. (per Dr. Estanislado Pandy).  Eliquis now on hold given bloody stool.  Therapy recommendations:  CIR - > pt w/o family support at home. 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 On metoprolol to 25 bid  . On and off eliquis since [facility] resolved and PEG was placed . Currently off eliquis d/t bloody stools  Hypertension  Home meds:  Hydralazine 50 tid, metoprolol 25 bid  On Hydralazine 25 mg Q8 and metoprolol 25 bid  BP stable  Hyperlipidemia  Home meds:  No statin  LDL 90, goal < 70  pt refuses statin  AKI on CKD  Stage III  Cre 1.68->1.65  Renal US R w/ increased echogenicity. No hydro  Mild  Improvement in Cre following resumption of TF, FW  Leukocytosis, resolved  WBC 7.6  afebrile  UA neg  CXR unremarkable  Continue monitoring  Chronic HFrEF Dilated Cardiomyopathy LBBB Prolonged QT  EF 30 to 40% since 2017  This admission EF 35 to 40%  CXR 6/1 no pulmonary edema  6/2 mild SOB with frequent coughing, concerning for fluid overload with IVF, TF and FW -> lasix IV 40mg  x 1  Dysphagia . Secondary to stroke . SLP difficulty d/t pt participation. . D1 puree w/ honey thick liquids -> pt not swallowing and eating  enough for daily needs -> Back to continuous feeding -> restarted TFs t a lower rate due to vomiting and persistent diarrhge Sunday 5/30 . S/p PEG 08/04/19, on TF @ 60 and FW 150 Q4 . Removal of PEG sutures 6/8 . Speech and dietician on board  Other Stroke Risk Factors  Advanced age  Coronary artery disease  Chronic systolic Congestive heart failure  Other Active Problems   BPH on flomax PTA  Prostate cancer   Hypokalemia - resolved   LBBB. Appears old. EKG neg ischemia. Trops 27->32  (Likely strain related)  Thrombocytosis - resolved  BRBPR  Episodes of N, V and diarrhea throughout admission.  Pt did not tolerate CorTrak -> PEG place 08/04/19 (trauma surgeons)  Jevity TF 60 ml / hour with free water 150 mls Q 4 hrs  Abd / Pelvis CT 2018 - Gallstones/hyperdense sludge dependent in the gallbladder. Numerous small stones in the distal common bile duct. No evidence of ductal dilatation.  Portable Abd 08/02/19 - Less prominent dilatation of small bowel compared to the prior study with probable component of minimal residual ileus  Imodium 2 mg per tube Q 6 hrs for diarrhea since 07/25/19. Small blood clots in loose stool with recurrence. (No indication for colonoscopy)   Protonix 40 mg Bid per tube since 07/19/19 for GERD  Eliquis again on hold - pt still getting Brilinta  Hgb - 9.7->9.5->9.0->9.0  HGB stable. Monitor stools  Will likely hold eliquis at d/c  Hospital Day #34  Recommend discontinue Eliquis since patient has had recurring bright red blood per stools.  Continue Brilinta.  Monitor 1 more day repeat CBC in the morning if stable will consider discharge to skilled nursing facility.  Long discussion over the phone with the patient wife and answered questions.  Greater than 50% time during this 25-minute visit were spent on counseling and coordination of care. About his rectal bleeding and discussion about risk benefit of anticoagulation in the setting of bleeding and  answering questions  Antony Contras, [place] Pager: [phone] 08/12/2019 5:55 PM To contact Stroke Continuity provider, please refer to [email or web address] After hours, contact General Neurology

## 2019-08-11 NOTE — Plan of Care (Signed)
  Problem: Education: Goal: Knowledge of General Education information will improve Description Including pain rating scale, medication(s)/side effects and non-pharmacologic comfort measures Outcome: Progressing   

## 2019-08-11 NOTE — Progress Notes (Signed)
STROKE TEAM PROGRESS NOTE   INTERVAL HISTORY  Patient is lying in bed.  He is doing better and is more alert this am..  Vital signs are stable.  Eliquis irestarted.   Hct stable at 28.2  Vitals:   08/11/19 0300 08/11/19 0756 08/11/19 1136 08/11/19 1556  BP:  118/88 119/66 120/71  Pulse:  79 63 73  Resp:  14 16 15   Temp: 99.3 F (37.4 C) (!) 97.5 F (36.4 C) 97.7 F (36.5 C) 97.8 F (36.6 C)  TempSrc: Axillary Oral Oral Oral  SpO2:  100% 98% 98%  Weight:      Height:       CBC:  Recent Labs  Lab 08/10/19 0321 08/11/19 0537  WBC 10.7* 8.3  HGB 9.5* 9.0*  HCT 29.2* 28.2*  MCV 93.0 95.6  PLT 411* 413*   Basic Metabolic Panel:  Recent Labs  Lab 08/10/19 0321 08/11/19 0537  NA 138 138  K 4.2 3.8  CL 102 102  CO2 25 25  GLUCOSE 124* 122*  BUN 38* 42*  CREATININE 1.48* 1.68*  CALCIUM 8.8* 8.5*    IMAGING past 24 hours No results found.   PHYSICAL EXAM     Temp:  [97.5 F (36.4 C)-99.3 F (37.4 C)] 97.8 F (36.6 C) (06/08 1556) Pulse Rate:  [60-84] 73 (06/08 1556) Resp:  [14-18] 15 (06/08 1556) BP: (91-131)/(63-88) 120/71 (06/08 1556) SpO2:  [97 %-100 %] 98 % (06/08 1556)  General - thin, well developed elderly caucasian male, not in acute distress  Ophthalmologic - fundi not visualized due to noncooperation.  Cardiovascular - Regular rhythm and rate, tachycardia resolved.  Neuro - awake alert, oriented x2, not to time, he follows simple commands, mild dysarthria, able to name and repeat, but psychomotor slowing, blinks to threat bilaterally, extraocular muscles intact, pupils appear equally round and reactive to light, left lower facial weakness however he reports sensation is intact in his face, tongue midline. BUE 4/5. BLE 3/5 proximal and distal. DTR 1+ and no babinski. Sensation intact subjectively, FTN not cooperative today. Gait not tested.   ASSESSMENT/PLAN [name] is a 79 y.o. male with history of Afib, systolic CHF, HLD, CAD, HTN,  prostate cancer presenting with left sided weakness, facial droop, dysarthria.   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 embolic secondary to known AF even on Eliquis  Code Stroke CT head evolving R MCA territory infarct (insula, cerebral cortex). hyperdense R M2 +/- M3. ASPECTS 7    CTA head & neck +LVO. Probable ruptured plaque R ICA bifurcation w/ near occlusive stenosis, possible superimposed dissection. Distal reconstitution R M1 from collaterals but w/ distal embolic occlusion proximal R M2/3 superior division branch. L ICA bifurcation and siphon atherosclerosis. B VA origins 50% stenoses. R subclavian artery origin 60% stenosis.   CT perfusion 19cc core infarct R insula and posterior frontal. 33cc penumbra.   Cerebral angio TICI3 revascularization occluded middle trifurcation R MCA branch, revascularization R ICA occlusion w/ angioplasty and stenting.   Post IR CT contrast stain R putamen and moderate R sylvian fissure, SAH + contrast.    CT head R sylvian fissure SAH. Increased contrast vs hemorrhage R suprasellar cistern following ICA concerned for increased SAH. R sulci effacement.   Carotid Doppler  R ICA stent patent  MRI  Patchy R MCA  infarcts. SAH R sylvian fissure. Small IVH.   MRA - Severely motion degraded head MRA. Persistent patency of the revascularized right ICA. No flow limiting proximal stenosis identified.   CT head repeat 5/11 R insular infarct stable. Residual small SAH and small IVH.  CT head repeat 5/13 evolving R MCA infarct, SAH unchanged   CT 5/30 SAH resolved.   2D Echo EF 35-40%. No source of embolus   LDL 90  HgbA1c 5.8  SCDs for VTE prophylaxis  Eliquis (apixaban) daily prior to admission, now on Brilinta (ticagrelor) 90 mg bid and Eliquis (apixaban) daily. Continue brilinta for total 3 months and  then switch to plavix and continue Eliquis (per Dr. Estanislado Pandy).    Therapy recommendations:  CIR - > pt w/o family support at home. Will need SNF  Disposition:  SNF  Plan for d/c on monday 6/7- quarantine bed will be available then - ordered COVID test for Sunday morning.  He did not get COVID vaccine, requiring a quarantine room in SNF  Acute hypoxemic respiratory failure, resolved Likely Aspiration PNA, treated  Extubated w/ CCM signing off 5/8  Vomiting x 5 with possible aspiration requiring NRB 5/10. CCM reconsulted. Transferred to ICU and Intubated  Extubated 5/11 w/o difficulties  Unasyn 5/10>>5/17 for 7 days  Leukocytosis - resolved  CXR - slight improvement aeration LLL w/ atx/infiltrate. Cardiomegaly.   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 Hold AC given post IR SAH and need for DAPT post stent placement. PEG Monday. Repeat head CT after PEG (Monday or Tuesday) morning and consider restarting eliquis. . Bradycardia resolved off sedation . On metoprolol 12.5 bid w/ tachycardia past 24h - increase metoprolol to 25 bid  . Resumed eliquis since [facility] has resolved and PEG has placed   Hypertension  Home meds:  Hydralazine 50 tid, metoprolol 25 bid  On Hydralazine 25 mg Q8 and metoprolol 25 bid . SBP 120s, on the low end  . Long-term BP goal normotensive  Hyperlipidemia  Home meds:  No statin  LDL 90, goal < 70  May consider statin again ->pt adamantly has refused statins in the past  AKI on CKD  Stage III  Cre 1.29->1.26->1.24->1.29->1.35->1.23->1.27->1.29->1.34->1.30  Renal US R w/ increased echogenicity. 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 Secondary to stroke . SLP difficulty d/t pt participation. . D1 puree w/ honey thick liquids -> pt not swallowing and eating enough for daily needs -> Back to continuous feeding -> restarted TFs t a lower rate due to vomiting and persistent diarrhge Sunday 5/30 . S/p PEG 08/04/19, on TF @ 60 and FW 150 Q4 . Remove sutures 6/8 . Speech and dietician on  board  Other Stroke Risk Factors  Advanced age  Coronary artery disease  Chronic systolic Congestive heart failure  Other Active Problems   BPH on flomax PTA  Prostate cancer   Hypokalemia - resolved   LBBB. Appears old. EKG neg ischemia. Trops 27->32  (Likely strain related)  Thrombocytosis - resolved  GI Issues  Episodes of N, V and diarrhea throughout admission.  Pt did not tolerate CorTrak -> PEG place 08/04/19 (trauma surgeons)  Jevity TF 60 ml / hour with free water 150 mls Q 4 hrs  Abd / Pelvis CT 2018 - Gallstones/hyperdense sludge dependent in the gallbladder. Numerous small stones in the distal common bile duct. No evidence of ductal dilatation.  Portable Abd 08/02/19 - Less prominent dilatation of small bowel compared to the prior study with probable component of minimal residual ileus  Imodium 2 mg per tube Q 6 hrs for diarrhea since 07/25/19. Small blood clots in loose stool with recurrence. (No indication for colonoscopy)   Protonix 40 mg Bid per tube since 07/19/19 for GERD  Eliquis now on hold per Dr Rory Percy - pt still getting Brilinta  Nurse reports pt has been confused today and has  refused meds and lab draws.  Hgb - 11.7->9.6->9.7->9.6->9.7->10.0->9.8->10.2->10.3 - pt refused lab draws today.  WBC's - 11.6->8.4->11.4->11.8->10.2 (afebrile)    Hospital Day #31   Continue present treatment.Marland Kitchen  Hopefully transfer to nursing home early next week.  No family available for discussion.    Antony Contras, MD Medical Director [facility] Pager: [phone] 08/11/2019 5:10 PM To contact Stroke Continuity provider, please refer to [email or web address] After hours, contact General Neurology

## 2019-08-11 NOTE — TOC Progression Note (Signed)
Transition of Care [facility]) - Progression Note    Patient Details  Name: Stephen Copeland MRN: [id number] Date of Birth: 09-26-1940  Transition of Care [facility]) CM/SW [place], [place] Work Phone Number: 08/11/2019, 2:30 PM  Clinical Narrative:    MSW Intern spoke with [facility], and they are now unwilling to take pt. MSW Intern reached out to the next closest facility, [facility] and they confirmed they have a rehab and ltc bed for pt. MSW Intern met with pt and wife to discuss options and they are agreeable to [facility]. They are planning on removing the mittens and SW will discharge tomorrow afternoon after 24 hours. SW will continue to follow for discharge.   Expected Discharge Plan: [place] Barriers to Discharge: Continued Medical Work up  Expected Discharge Plan and Services Expected Discharge Plan: [place] arrangements for the past 2 months: Single Family Home                                       Social Determinants of Health (SDOH) Interventions    Readmission Risk Interventions No flowsheet data found.

## 2019-08-12 LAB — GLUCOSE, CAPILLARY
Glucose-Capillary: 107 mg/dL — ABNORMAL HIGH (ref 70–99)
Glucose-Capillary: 107 mg/dL — ABNORMAL HIGH (ref 70–99)
Glucose-Capillary: 108 mg/dL — ABNORMAL HIGH (ref 70–99)
Glucose-Capillary: 112 mg/dL — ABNORMAL HIGH (ref 70–99)
Glucose-Capillary: 123 mg/dL — ABNORMAL HIGH (ref 70–99)
Glucose-Capillary: 125 mg/dL — ABNORMAL HIGH (ref 70–99)
Glucose-Capillary: 98 mg/dL (ref 70–99)

## 2019-08-12 LAB — CBC
HCT: 27.9 % — ABNORMAL LOW (ref 39.0–52.0)
Hemoglobin: 9 g/dL — ABNORMAL LOW (ref 13.0–17.0)
MCH: 30.5 pg (ref 26.0–34.0)
MCHC: 32.3 g/dL (ref 30.0–36.0)
MCV: 94.6 fL (ref 80.0–100.0)
Platelets: 461 10*3/uL — ABNORMAL HIGH (ref 150–400)
RBC: 2.95 MIL/uL — ABNORMAL LOW (ref 4.22–5.81)
RDW: 15.2 % (ref 11.5–15.5)
WBC: 7.6 10*3/uL (ref 4.0–10.5)
nRBC: 0 % (ref 0.0–0.2)

## 2019-08-12 LAB — BASIC METABOLIC PANEL
Anion gap: 9 (ref 5–15)
BUN: 39 mg/dL — ABNORMAL HIGH (ref 8–23)
CO2: 26 mmol/L (ref 22–32)
Calcium: 8.7 mg/dL — ABNORMAL LOW (ref 8.9–10.3)
Chloride: 105 mmol/L (ref 98–111)
Creatinine, Ser: 1.65 mg/dL — ABNORMAL HIGH (ref 0.61–1.24)
GFR calc Af Amer: 45 mL/min — ABNORMAL LOW (ref >60)
GFR calc non Af Amer: 39 mL/min — ABNORMAL LOW (ref >60)
Glucose, Bld: 110 mg/dL — ABNORMAL HIGH (ref 70–99)
Potassium: 4 mmol/L (ref 3.5–5.1)
Sodium: 140 mmol/L (ref 135–145)

## 2019-08-12 MED ORDER — HYDROCORTISONE (PERIANAL) 2.5 % EX CREA
TOPICAL_CREAM | Freq: Two times a day (BID) | CUTANEOUS | Status: DC
  Filled 2019-08-12: qty 28.35

## 2019-08-12 NOTE — Progress Notes (Signed)
Nutrition Follow-up  DOCUMENTATION CODES:   Not applicable  INTERVENTION:  Continue via PEG: Jevity1.5 @ 60 ml/hr (1458m per day) 30 ml Prostat daily 2016mfree water Q4H(per MD)  Provides: 2260 kcal, 106grams protein, and 1094 ml free water(22943motal free water with flushes).   NUTRITION DIAGNOSIS:   Inadequate oral intake related to inability to eat as evidenced by NPO status.  Progressing, pt now on Dysphagia 1 diet with Honey Thick liquids  GOAL:   Patient will meet greater than or equal to 90% of their needs  Met with TF.   MONITOR:   TF tolerance, Labs  REASON FOR ASSESSMENT:   Consult Calorie Count  ASSESSMENT:   Pt with PMH of CHF, CAD, cardiomyopathy, hiatal hernia, HLD, and HTN now admitted with R CVA s/p thrombectomy and carotid stent.  5/6 admitted s/p IR, remained intubated post-op 5/7 extubated; cortrak placed  5/10 N/V with normal KUB; intubated 5/11 extubated, cortrak removed 5/12 failed swallow eval; cortrak replaced as tube was inadvertently pulled with extubation 5/17 diet upgraded to dysphagia 1 with honey thick liquids 5/18 TF switched to nocturnal 5/20 TF returned to continuous due to poor po intake 6/1 s/p EGD and PEG placement 6/5 pt with bloody stools, TF held 6/7 TF resumed    Per RN, pt had large blood clot from rectum, MD notified. RN also reports that pt refused repeat blood draw.   TF via PEG: Jevity 1.5 cal @ 36m39m, 30ml70m-stat daily, 200ml 90m water Q4H (per MD)  Labs reviewed. CBGs 107-112-125 Medications: Novolog, Imodium  Diet Order:   Diet Order            DIET - DYS 1 Room service appropriate? Yes; Fluid consistency: Honey Thick  Diet effective 1400              EDUCATION NEEDS:   No education needs have been identified at this time  Skin:  Skin Assessment: Skin Integrity Issues: Skin Integrity Issues:: Other (Comment), Incisions Incisions: groin Other: MASD groin  Last BM:  6/9 type  5  Height:   Ht Readings from Last 1 Encounters:  08/04/19 _0  (1.88 m)    Weight:   Wt Readings from Last 1 Encounters:  08/09/19 80.5 kg    Ideal Body Weight:  86.3 kg  BMI:  Body mass index is 22.79 kg/m.  Estimated Nutritional Needs:   Kcal:  2100-2300  Protein:  100-115 grams  Fluid:  >2 L/day   AmandaLarkin InaRD, LDN RD pager number and weekend/on-call pager number located in [email or web address]

## 2019-08-12 NOTE — Progress Notes (Signed)
@   0730-  This RN noticed a huge blood clot that came out of pt.'s rectum, VS checked and within normal limits -pt. Does not have abd.pain or any complains -STAT CBC ordered - Sent a secured chat to Attending Dr. Leonie Man & Dr. Miachel Roux. - Eliquis PO held as ordered. @ 5826- Pt strongly refused repeat blood draw, Attending & NP informed

## 2019-08-12 NOTE — Progress Notes (Signed)
Noted  Small  amount of light pink blood from the anal area after pt has had a b m x 2 No acute bleeding , MD on call notified, To continue to monitor pt closely.

## 2019-08-13 ENCOUNTER — Inpatient Hospital Stay (HOSPITAL_COMMUNITY): Payer: Medicare Other

## 2019-08-13 HISTORY — PX: IR REPLC GASTRO/COLONIC TUBE PERCUT W/FLUORO: IMG2333

## 2019-08-13 LAB — CBC
HCT: 27.1 % — ABNORMAL LOW (ref 39.0–52.0)
Hemoglobin: 8.5 g/dL — ABNORMAL LOW (ref 13.0–17.0)
MCH: 30.4 pg (ref 26.0–34.0)
MCHC: 31.4 g/dL (ref 30.0–36.0)
MCV: 96.8 fL (ref 80.0–100.0)
Platelets: 488 10*3/uL — ABNORMAL HIGH (ref 150–400)
RBC: 2.8 MIL/uL — ABNORMAL LOW (ref 4.22–5.81)
RDW: 15.6 % — ABNORMAL HIGH (ref 11.5–15.5)
WBC: 9.2 10*3/uL (ref 4.0–10.5)
nRBC: 0 % (ref 0.0–0.2)

## 2019-08-13 LAB — GLUCOSE, CAPILLARY
Glucose-Capillary: 87 mg/dL (ref 70–99)
Glucose-Capillary: 107 mg/dL — ABNORMAL HIGH (ref 70–99)
Glucose-Capillary: 118 mg/dL — ABNORMAL HIGH (ref 70–99)
Glucose-Capillary: 107 mg/dL — ABNORMAL HIGH (ref 70–99)
Glucose-Capillary: 101 mg/dL — ABNORMAL HIGH (ref 70–99)
Glucose-Capillary: 107 mg/dL — ABNORMAL HIGH (ref 70–99)

## 2019-08-13 LAB — BASIC METABOLIC PANEL
Anion gap: 10 (ref 5–15)
BUN: 43 mg/dL — ABNORMAL HIGH (ref 8–23)
CO2: 26 mmol/L (ref 22–32)
Calcium: 8.8 mg/dL — ABNORMAL LOW (ref 8.9–10.3)
Chloride: 103 mmol/L (ref 98–111)
Creatinine, Ser: 1.79 mg/dL — ABNORMAL HIGH (ref 0.61–1.24)
GFR calc Af Amer: 41 mL/min — ABNORMAL LOW (ref >60)
GFR calc non Af Amer: 36 mL/min — ABNORMAL LOW (ref >60)
Glucose, Bld: 88 mg/dL (ref 70–99)
Potassium: 3.2 mmol/L — ABNORMAL LOW (ref 3.5–5.1)
Sodium: 139 mmol/L (ref 135–145)

## 2019-08-13 MED ORDER — SODIUM CHLORIDE 0.9 % IV SOLN: INTRAVENOUS | Status: DC

## 2019-08-13 MED ORDER — IOHEXOL 300 MG/ML  SOLN
50.0000 mL | Freq: Once | INTRAMUSCULAR | Status: AC | PRN
  Administered 2019-08-13: 10 mL

## 2019-08-13 MED ORDER — LIDOCAINE VISCOUS HCL 2 % MT SOLN
OROMUCOSAL | Status: AC
  Filled 2019-08-13: qty 15

## 2019-08-13 MED ORDER — IOPAMIDOL (ISOVUE-300) INJECTION 61%
50.0000 mL | Freq: Once | INTRAVENOUS | Status: AC | PRN
  Administered 2019-08-13: 50 mL

## 2019-08-13 MED ORDER — FREE WATER
300.0000 mL | Status: DC
  Administered 2019-08-13 – 2019-08-16 (×16): 300 mL

## 2019-08-13 NOTE — Progress Notes (Signed)
STROKE TEAM PROGRESS NOTE   INTERVAL HISTORY  Pt continues to have  bright red blood clot with stools   not mixed with stool. HGB stable. eliquis put on hold.  He pulled out his PEG tube this morning but does not appear to be in any distress.  Trauma surgery team were notified who placed a Foley catheter in and will request interventional radiology to exchange it over for a G-tube Vitals:   08/13/19 0309 08/13/19 0737 08/13/19 1131 08/13/19 1518  BP: 118/87 (!) 128/54 115/82 116/76  Pulse: (!) 50 73 65 89  Resp: 16 18 18 18   Temp: 97.8 F (36.6 C) (!) 97.4 F (36.3 C) (!) 97.5 F (36.4 C) 97.8 F (36.6 C)  TempSrc: Axillary Oral Oral   SpO2: 99% 93% 98% 97%  Weight:      Height:       CBC:  Recent Labs  Lab 08/12/19 0347 08/13/19 0336  WBC 7.6 9.2  HGB 9.0* 8.5*  HCT 27.9* 27.1*  MCV 94.6 96.8  PLT 461* 517*   Basic Metabolic Panel:  Recent Labs  Lab 08/12/19 0347 08/13/19 0336  NA 140 139  K 4.0 3.2*  CL 105 103  CO2 26 26  GLUCOSE 110* 88  BUN 39* 43*  CREATININE 1.65* 1.79*  CALCIUM 8.7* 8.8*    IMAGING past 24 hours DG ABDOMEN PEG TUBE LOCATION  Result Date: 08/13/2019 CLINICAL DATA:  PEG tube injection EXAM: ABDOMEN - 1 VIEW COMPARISON:  Portable exam 1103 hours compared to 08/02/2019 FINDINGS: PEG tube was injected with 50 cc of Omnipaque 300. Contrast opacifies the gastric lumen, duodenal bulb, and duodenal sweep and extends into the proximal jejunal loops. Small duodenal diverticulum identified. No contrast extravasation. Atherosclerotic calcifications aorta. Bones demineralized. IMPRESSION: Replaced PEG tube is within the gastric lumen. Electronically Signed   By: Lavonia Dana M.D.   On: 08/13/2019 11:33     PHYSICAL EXAM      General - thin, well developed elderly caucasian male, not in acute distress  Ophthalmologic - fundi not visualized due to noncooperation.  Cardiovascular - Regular rhythm and rate, tachycardia resolved.  Neuro - awake alert,  oriented x2, not to time, he follows simple commands, mild dysarthria, able to name and repeat, but psychomotor slowing, blinks to threat bilaterally, extraocular muscles intact, pupils appear equally round and reactive to light, left lower facial weakness however he reports sensation is intact in his face, tongue midline. BUE4/5. BLE3/5 proximal and distal. DTR 1+ and no babinski. Sensation intact subjectively, FTN not cooperative today. Gait not tested.  ASSESSMENT/PLAN [name] is a 79 y.o. male with history of Afib, systolic CHF, HLD, CAD, HTN, prostate cancer presenting with left sided weakness, facial droop, dysarthria.   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 embolic secondary to known AF even on Eliquis  Eliquis for VTE prophylaxis  Eliquis (apixaban) daily prior to admission, now on Brilinta (ticagrelor) 90 mg bid. Continue brilinta for total 3 months and then switch to plavix. (per Dr. Estanislado Pandy).  Eliquis now on hold given bloody stool.  Therapy recommendations:  CIR - > pt w/o family support at home. 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 On metoprolol to 25 bid  . On and off eliquis since [facility] resolved and PEG was placed . Currently off eliquis d/t bloody stools  Hypertension  Home meds:  Hydralazine 50 tid, metoprolol 25 bid  On Hydralazine 25 mg Q8 and metoprolol 25 bid  BP stable  Hyperlipidemia  Home meds:  No statin  LDL 90, goal <  70  pt refuses statin  AKI on CKD  Stage III  Cre 1.68->1.65  Renal US R w/ increased echogenicity. No hydro  Mild  Improvement in Cre following resumption of TF, FW  Leukocytosis, resolved  WBC 7.6  afebrile  UA neg  CXR unremarkable  Continue monitoring  Chronic HFrEF Dilated Cardiomyopathy LBBB Prolonged QT  EF 30 to 40% since 2017  This admission EF 35 to 40%  CXR 6/1 no pulmonary edema  6/2 mild SOB with frequent coughing, concerning for fluid overload with IVF, TF and FW -> lasix IV 40mg  x 1  Dysphagia . Secondary to stroke . SLP difficulty d/t pt participation. . D1 puree w/ honey thick liquids -> pt not swallowing and eating enough for daily needs -> Back to continuous feeding -> restarted TFs t a lower rate due to vomiting and persistent diarrhge Sunday 5/30 . S/p PEG 08/04/19, on TF @ 60 and FW 150 Q4 . Removal of PEG sutures 6/8 . Speech and dietician on board  Other Stroke Risk Factors  Advanced age  Coronary artery disease  Chronic systolic Congestive heart failure  Other Active Problems   BPH on flomax PTA  Prostate cancer   Hypokalemia - resolved   LBBB. Appears old. EKG neg ischemia. Trops 27->32  (Likely strain related)  Thrombocytosis - resolved  BRBPR  Episodes of N, V and diarrhea throughout admission.  Pt did not tolerate CorTrak -> PEG place 08/04/19 (trauma surgeons)  Jevity TF 60 ml / hour with free water 150 mls Q 4 hrs  Abd / Pelvis CT 2018 - Gallstones/hyperdense sludge dependent in the gallbladder. Numerous small stones in the distal common bile duct. No evidence of ductal dilatation.  Portable Abd 08/02/19 - Less prominent dilatation of small bowel compared to the prior study with probable component of minimal residual ileus  Imodium 2 mg per tube Q 6 hrs for diarrhea since 07/25/19. Small blood clots in loose stool with recurrence. (No indication for colonoscopy)   Protonix 40 mg Bid per tube since 07/19/19 for  GERD  Eliquis again on hold - pt still getting Brilinta  Hgb - 9.7->9.5->9.0->9.0  HGB stable. Monitor stools  Will likely hold eliquis at d/c  Hospital Day #35  Patient has pulled out his PEG tube even though he is on a dysphagia 1 diet he is clearly not eating adequately to maintain his nourishment.  IR team has been consulted to exchange G-tube over Foley catheter.  Continue to monitor hematocrit and hemodynamic status.  Post phone nursing home transferred till next week  Antony Contras, MD [place] Pager: [phone] 08/13/2019 4:23 PM To contact Stroke Continuity provider, please refer to [email or web address] After hours, contact General Neurology

## 2019-08-13 NOTE — Progress Notes (Signed)
Spoke with Dr. Lorraine Lax about foley placement confirmation.  Patient returned from IR with foley still in place with no new peg tube placement.  KUB and x-ray were done to confirm placement but orders were unclear about usage.  Dr. Lorraine Lax verbalized not to use foley to administer medication.  Patient does have Dysphagia 1 diet, honey thick liquids so oral route will be used to administer medication this evening.

## 2019-08-13 NOTE — Progress Notes (Signed)
PT Cancellation Note  Patient Details Name: Kahli Fitzgerald MRN: [id number] DOB: 03/22/1940   Cancelled Treatment:    Reason Eval/Treat Not Completed: Medical issues which prohibited therapy;Patient at procedure or test/unavailable  Attempted to see pt in a.m. and pt had pulled out his PEG with multiple staff members in room.  Currently out of room--getting PEG replaced.    Arby Barrette, PT Pager [phone]  Rexanne Mano 08/13/2019, 4:41 PM

## 2019-08-13 NOTE — Progress Notes (Signed)
  Speech Language Pathology Treatment: Dysphagia;Cognitive-Linquistic  Patient Details Name: Stephen Copeland MRN: [id number] DOB: [birth year] Today's Date: 08/13/2019 Time: 9211-9417 SLP Time Calculation (min) (ACUTE ONLY): 8 min  Assessment / Plan / Recommendation Clinical Impression  Pt continues to be resistant to any interventions. Begs to be left alone, does not want PO. SLP repositioned and provided oral care despite his request to desist. Initiated simple oral motor movement to increase awareness of oral articulation/labial seal with PO. Pt attempted x2 and then said he wanted to rest. Cannot motivate or engage pt in meaningful activity. He has no interest in oral intake at this time. Will sign off due to lack of progress. Reattempt at next level of care.   HPI HPI: 79 y/o M admitted 5/6 with right sided CVA and remained intubated post thrombectomy and carotid stent. He was outside the TPA window. Intubated 5/6-5/7, then on 5/10 pt had multiple episodes of vomiting brown gastric contents and was reintubted. Pt was seen by SLP in the interim, found to have acute neurogenic dysphagia with signs of aspiration with minimal PO trials. Has Cortrak, on D1/HTL diet.       SLP Plan  Discharge SLP treatment due to (comment) (unable to participate)       Recommendations  Diet recommendations: Dysphagia 1 (puree);Honey-thick liquid                Oral Care Recommendations: Oral care BID Follow up Recommendations: Skilled Nursing facility SLP Visit Diagnosis: Dysphagia, oropharyngeal phase (R13.12) Plan: Discharge SLP treatment due to (comment) (unable to participate)       GO                Luzmaria Devaux, Katherene Ponto 08/13/2019, 9:09 AM

## 2019-08-13 NOTE — Progress Notes (Signed)
Called to bedside because patient pulled out his PEG tube that was placed on 08/04/19. Foley catheter inserted and confirmed placement in stomach with contrast and xray. Will ask IR to exchange for new G tube.  Wellington Hampshire, [facility] 08/13/2019, 11:16 AM Please see Amion for pager number during day hours 7:00am-4:30pm

## 2019-08-13 NOTE — Progress Notes (Signed)
Patient was found to have self-removed the PEG tube from the cavity. Surgeon paged. General Surgery PA paged. Foley was placed to maintain the surgical opening. KUB ordered and confirmed placement of the foley. IR paged for exchange for proper PEG tube. Patient is stable and abdominal binder placed.

## 2019-08-14 LAB — GLUCOSE, CAPILLARY
Glucose-Capillary: 101 mg/dL — ABNORMAL HIGH (ref 70–99)
Glucose-Capillary: 109 mg/dL — ABNORMAL HIGH (ref 70–99)
Glucose-Capillary: 117 mg/dL — ABNORMAL HIGH (ref 70–99)
Glucose-Capillary: 89 mg/dL (ref 70–99)
Glucose-Capillary: 94 mg/dL (ref 70–99)
Glucose-Capillary: 94 mg/dL (ref 70–99)
Glucose-Capillary: 99 mg/dL (ref 70–99)

## 2019-08-14 MED ORDER — ENSURE ENLIVE PO LIQD
237.0000 mL | Freq: Two times a day (BID) | ORAL | Status: DC
  Administered 2019-08-15 – 2019-08-25 (×14): 237 mL via ORAL

## 2019-08-14 NOTE — Progress Notes (Signed)
OT Cancellation Note  Patient Details Name: Stephen Copeland MRN: [id number] DOB: [birth year]   Cancelled Treatment:    Reason Eval/Treat Not Completed: Patient declined, no reason specified;Other (comment) Pt aggressively declining all therapy today , demanding PT/OT exit room. Will follow up for OT session as time allows.  Lanier Clam., COTA/L Acute Rehabilitation Services [phone] [place] 08/14/2019, 12:16 PM

## 2019-08-14 NOTE — Progress Notes (Signed)
PT Cancellation Note  Patient Details Name: Stephen Copeland MRN: [id number] DOB: September 02, 1940   Cancelled Treatment:     Attempted to see pt for PT treatment. When PT/OT introduced themselves pt became agitated and yelling "I hate physical therapy!" among other things. Pt wanted bilat mitts off. PT told pt mitts could be taken off if mobilizing with therapy however pt declined mobilizing. PT will attempt to see pt later today as time allows.    Earney Navy, PTA Acute Rehabilitation Services Pager: [phone] Office: [phone]   08/14/2019, 9:15 AM

## 2019-08-14 NOTE — Progress Notes (Signed)
STROKE TEAM PROGRESS NOTE   INTERVAL HISTORY  Pt had G-tube replaced yesterday by interventional radiology.  He neurologically appears unchanged.  Tube feeds have not yet been started .vital signs are stable. Vitals:   08/14/19 0300 08/14/19 0326 08/14/19 0756 08/14/19 1129  BP: (!) 142/78  140/88 (!) 160/100  Pulse: 86  96 (!) 109  Resp: 19  16 20   Temp: 98 F (36.7 C)  (!) 97.5 F (36.4 C) 98 F (36.7 C)  TempSrc: Axillary  Oral Oral  SpO2: 99%  99% 98%  Weight:  79.4 kg    Height:       CBC:  Recent Labs  Lab 08/12/19 0347 08/13/19 0336  WBC 7.6 9.2  HGB 9.0* 8.5*  HCT 27.9* 27.1*  MCV 94.6 96.8  PLT 461* 563*   Basic Metabolic Panel:  Recent Labs  Lab 08/12/19 0347 08/13/19 0336  NA 140 139  K 4.0 3.2*  CL 105 103  CO2 26 26  GLUCOSE 110* 88  BUN 39* 43*  CREATININE 1.65* 1.79*  CALCIUM 8.7* 8.8*    IMAGING past 24 hours IR Replc Gastro/Colonic Tube Percut W/Fluoro  Result Date: 08/13/2019 INDICATION: Displaced percutaneous gastrostomy tube. A 16 French Foley catheter is currently maintaining the tract. EXAM: GASTROSTOMY CATHETER REPLACEMENT MEDICATIONS: None ANESTHESIA/SEDATION: None. CONTRAST:  60mL OMNIPAQUE IOHEXOL 300 MG/ML SOLN - administered into the gastric lumen. FLUOROSCOPY TIME:  Fluoroscopy Time: 0 minutes 6 seconds (1 mGy). COMPLICATIONS: None immediate. PROCEDURE: Informed written consent was obtained from the patient after a thorough discussion of the procedural risks, benefits and alternatives. All questions were addressed. Maximal Sterile Barrier Technique was utilized including caps, mask, sterile gowns, sterile gloves, sterile drape, hand hygiene and skin antiseptic. A timeout was performed prior to the initiation of the procedure. The Foley catheter balloon was deflated and the tube removed. A new 34 French percutaneous gastrostomy tube was inserted through the tract and into the stomach. The retention balloon was inflated and pulled snug against  the anterior abdominal wall. The external bumper was fixed in place. Contrast injection was then performed under fluoroscopy. The injected contrast fills the stomach confirming intragastric location. IMPRESSION: Successful exchange for a new 80 French percutaneous gastrostomy tube which is within the stomach. Electronically Signed   By: Jacqulynn Cadet M.D.   On: 08/13/2019 17:00     PHYSICAL EXAM      General - thin, well developed elderly caucasian male, not in acute distress  Ophthalmologic - fundi not visualized due to noncooperation.  Cardiovascular - Regular rhythm and rate, tachycardia resolved.  Neuro - awake alert, oriented x2, not to time, he follows simple commands, mild dysarthria, able to name and repeat, but psychomotor slowing, blinks to threat bilaterally, extraocular muscles intact, pupils appear equally round and reactive to light, left lower facial weakness however he reports sensation is intact in his face, tongue midline. BUE4/5. BLE3/5 proximal and distal. DTR 1+ and no babinski. Sensation intact subjectively, FTN not cooperative today. Gait not tested.  ASSESSMENT/PLAN [name] is a 79 y.o. male with history of Afib, systolic CHF, HLD, CAD, HTN, prostate cancer presenting with left sided weakness, facial droop, dysarthria.   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 embolic secondary to known AF even on Eliquis  Eliquis for VTE prophylaxis  Eliquis (apixaban) daily  prior to admission, now on Brilinta (ticagrelor) 90 mg bid. Continue brilinta for total 3 months and then switch to plavix. (per Dr. Estanislado Pandy).  Eliquis now on hold given bloody stool.  Therapy recommendations:  CIR - > pt w/o family support at home. 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 On metoprolol to 25 bid  . On and off eliquis since [facility] resolved and PEG was placed . Currently off eliquis d/t bloody stools  Hypertension  Home meds:  Hydralazine 50 tid, metoprolol 25 bid  On Hydralazine 25 mg Q8 and metoprolol 25 bid  BP stable  Hyperlipidemia  Home meds:  No statin  LDL 90, goal < 70  pt refuses statin  AKI on CKD  Stage III  Cre 1.68->1.65  Renal US R w/ increased echogenicity. No hydro  Mild  Improvement in Cre following resumption of TF, FW  Leukocytosis, resolved  WBC 7.6  afebrile  UA neg  CXR unremarkable  Continue monitoring  Chronic HFrEF Dilated Cardiomyopathy LBBB Prolonged QT  EF 30 to 40% since 2017  This admission EF 35 to 40%  CXR 6/1 no pulmonary edema  6/2 mild SOB with frequent coughing, concerning for fluid overload with IVF, TF and FW -> lasix IV 40mg  x 1  Dysphagia . Secondary to stroke . SLP difficulty d/t pt participation. . D1 puree w/ honey thick liquids -> pt not swallowing and eating enough for daily needs -> Back to continuous feeding -> restarted TFs t a lower rate due to vomiting and persistent diarrhge Sunday 5/30 . S/p PEG 08/04/19, on TF @ 60 and FW 150 Q4 . Removal of PEG sutures 6/8 . Speech and dietician on board  Other Stroke Risk Factors  Advanced age  Coronary artery disease  Chronic systolic Congestive heart failure  Other Active Problems   BPH on flomax PTA  Prostate cancer   Hypokalemia - resolved   LBBB. Appears old. EKG neg ischemia. Trops 27->32  (Likely strain related)  Thrombocytosis - resolved  BRBPR  Episodes of N, V and diarrhea throughout admission.  Pt  did not tolerate CorTrak -> PEG place 08/04/19 (trauma surgeons)  Jevity TF 60 ml / hour with free water 150 mls Q 4 hrs  Abd / Pelvis CT 2018 - Gallstones/hyperdense sludge dependent in the gallbladder. Numerous small stones in the distal common bile duct. No evidence of ductal dilatation.  Portable Abd 08/02/19 - Less prominent dilatation of small bowel compared to the prior study with probable component of minimal residual ileus  Imodium 2 mg per tube Q 6 hrs for diarrhea since 07/25/19. Small blood clots in loose stool with recurrence. (No indication for colonoscopy)   Protonix 40 mg Bid per tube since 07/19/19 for GERD  Eliquis again on hold - pt still getting Brilinta  Hgb - 9.7->9.5->9.0->9.0  HGB stable. Monitor stools  Will likely hold eliquis at d/c  Hospital Day #36  Patient had pulled out his PEG tube which has now been replaced by IR. Even though he is on a dysphagia 1 diet he is clearly  not eating adequately to maintain his nourishment. .  Continue to monitor hematocrit and hemodynamic status.  Hopefully nursing home transfer till next week  Antony Contras, [place] Pager: [phone] 08/14/2019 1:43 PM To contact Stroke Continuity provider, please refer to [email or web address] After hours, contact General Neurology

## 2019-08-15 LAB — CBC
HCT: 25.3 % — ABNORMAL LOW (ref 39.0–52.0)
Hemoglobin: 7.8 g/dL — ABNORMAL LOW (ref 13.0–17.0)
MCH: 30.2 pg (ref 26.0–34.0)
MCHC: 30.8 g/dL (ref 30.0–36.0)
MCV: 98.1 fL (ref 80.0–100.0)
Platelets: 440 10*3/uL — ABNORMAL HIGH (ref 150–400)
RBC: 2.58 MIL/uL — ABNORMAL LOW (ref 4.22–5.81)
RDW: 16.1 % — ABNORMAL HIGH (ref 11.5–15.5)
WBC: 5.6 10*3/uL (ref 4.0–10.5)
nRBC: 0 % (ref 0.0–0.2)

## 2019-08-15 LAB — GLUCOSE, CAPILLARY
Glucose-Capillary: 88 mg/dL (ref 70–99)
Glucose-Capillary: 95 mg/dL (ref 70–99)
Glucose-Capillary: 98 mg/dL (ref 70–99)
Glucose-Capillary: 137 mg/dL — ABNORMAL HIGH (ref 70–99)
Glucose-Capillary: 109 mg/dL — ABNORMAL HIGH (ref 70–99)

## 2019-08-15 LAB — BASIC METABOLIC PANEL
Anion gap: 11 (ref 5–15)
BUN: 29 mg/dL — ABNORMAL HIGH (ref 8–23)
CO2: 22 mmol/L (ref 22–32)
Calcium: 8.5 mg/dL — ABNORMAL LOW (ref 8.9–10.3)
Chloride: 105 mmol/L (ref 98–111)
Creatinine, Ser: 1.49 mg/dL — ABNORMAL HIGH (ref 0.61–1.24)
GFR calc Af Amer: 51 mL/min — ABNORMAL LOW (ref >60)
GFR calc non Af Amer: 44 mL/min — ABNORMAL LOW (ref >60)
Glucose, Bld: 97 mg/dL (ref 70–99)
Potassium: 4.1 mmol/L (ref 3.5–5.1)
Sodium: 138 mmol/L (ref 135–145)

## 2019-08-15 NOTE — Progress Notes (Signed)
STROKE TEAM PROGRESS NOTE   INTERVAL HISTORY: Pt had G-tube replaced by interventional radiology this week.  He neurologically appears stable.  Tube feeds have not yet been started, will start today per his nurse who is at bedside .vital signs are still stable. Hgb decreased today, will monitor and schedule repeat labs in the morning. BUN/Creatinine improving. Very talkative today, wants to watch golf on TV.  Vitals:   08/15/19 0145 08/15/19 0317 08/15/19 0822 08/15/19 1236  BP:  137/75 135/61 (!) 141/71  Pulse:  61 64 60  Resp:  18 16 14   Temp:  98.3 F (36.8 C) 97.8 F (36.6 C) 98 F (36.7 C)  TempSrc:  Oral Oral Oral  SpO2:  99% 100% 100%  Weight: 78.8 kg     Height:       CBC:  Recent Labs  Lab 08/13/19 0336 08/15/19 0341  WBC 9.2 5.6  HGB 8.5* 7.8*  HCT 27.1* 25.3*  MCV 96.8 98.1  PLT 488* 850*   Basic Metabolic Panel:  Recent Labs  Lab 08/13/19 0336 08/15/19 0341  NA 139 138  K 3.2* 4.1  CL 103 105  CO2 26 22  GLUCOSE 88 97  BUN 43* 29*  CREATININE 1.79* 1.49*  CALCIUM 8.8* 8.5*    IMAGING past 24 hours No results found.    PHYSICAL EXAM     General - thin, well developed elderly caucasian male, not in acute distress  Ophthalmologic - fundi not visualized due to noncooperation.  Cardiovascular - Regular rhythm and rate, no M/R/G  Neuro - awake alert, oriented self, says it is May 2021, tells me to change channel to 2 multiple times,  Follows simple commands nut non-cooperative this morning and refuses motor exam until I change the channel, very talkative, but then cooperates with commands, dysarthria; difficult to understand , able to name and repeat, but psychomotor slowing, blinks to threat bilaterally, extraocular muscles intact, pupils appear equally round and reactive to light, left lower facial weakness however he reports sensation is intact in his face, tongue midline. BUE4/5. BLE4/5 proximal and distal. Can hold limbs anti-gravity without  drift.  DTR 1+ and no babinski. Sensation intact subjectively, FTN not cooperative today. Gait not tested.   ASSESSMENT/PLAN [name] is a 79 y.o. male with history of Afib, systolic CHF, HLD, CAD, HTN, prostate cancer presenting with left sided weakness, facial droop, dysarthria.   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 embolic secondary to known AF even on Eliquis  Eliquis for VTE prophylaxis  Eliquis (apixaban) daily prior to admission, now on Brilinta (ticagrelor) 90 mg bid. Continue brilinta for total 3 months and then switch to plavix. (per Dr. Estanislado Pandy).  Eliquis now on hold given bloody stool.  Therapy recommendations:  CIR - > pt w/o family support at home. 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 resolved and PEG was placed  Currently off eliquis d/t bloody stools  Hypertension  Home meds:  Hydralazine 50 tid, metoprolol 25 bid  On Hydralazine 25 mg Q8 and metoprolol 25 bid  BP stable  Hyperlipidemia  Home meds:  No statin  LDL 90, goal < 70  pt refuses statin  AKI on CKD  Stage III  Cre 1.68->1.65 -> 1.49 improving  Renal US R w/ increased echogenicity. No hydro  Mild  Improvement in Cre following resumption of TF, FW  Leukocytosis, resolved  WBC 5.6  afebrile  UA neg  CXR unremarkable  Continue monitoring  Chronic HFrEF Dilated Cardiomyopathy LBBB Prolonged QT  EF 30  to 40% since 2017  This admission EF 35 to 40%  CXR 6/1 no pulmonary edema  6/2 mild SOB with frequent coughing, concerning for fluid overload with IVF, TF and FW -> lasix IV 40mg  x 1  Dysphagia  Secondary to stroke  SLP difficulty d/t pt participation.  D1 puree w/ honey thick liquids -> pt not swallowing and eating enough for daily needs -> Back to continuous feeding -> restarted TFs t a lower rate due to vomiting and persistent diarrhge Sunday 5/30  S/p PEG 08/04/19, on TF @ 60 and FW 150 Q4  Removal of PEG sutures 6/8  Speech and dietician on board  Other Stroke Risk Factors  Advanced age  Coronary artery disease  Chronic systolic Congestive heart failure  Other Active Problems   BPH on flomax PTA  Prostate cancer   Hypokalemia - resolved   LBBB. Appears old. EKG neg ischemia. Trops 27->32  (Likely strain related)  Thrombocytosis - resolved  BRBPR  Episodes of N, V and diarrhea throughout admission.  Pt did not tolerate CorTrak -> PEG place 08/04/19 (trauma surgeons)  Jevity TF 60 ml / hour with free water 150 mls Q 4 hrs  Abd / Pelvis CT 2018 - Gallstones/hyperdense sludge dependent in the gallbladder. Numerous small stones in the distal common bile duct. No evidence of ductal dilatation.  Portable Abd 08/02/19 - Less prominent dilatation of small bowel compared to the prior study with probable component of minimal residual ileus  Imodium 2 mg per tube Q 6 hrs for diarrhea since 07/25/19. Small blood clots in loose stool with recurrence. (No indication for colonoscopy)   Protonix 40 mg Bid per tube since 07/19/19 for GERD  Eliquis again on hold - pt still getting Brilinta  Hgb - 9.7->9.5->9.0->9.0.Marland KitchenMarland KitchenMarland Kitchen8.5->7.8  HGB stable. Monitor stools - CBC Sunday AM  Will likely hold eliquis at d/c  Hospital Day #37    Patient had pulled out his PEG tube which has now been replaced by IR. Even though he is on a dysphagia 1 diet he is clearly not eating adequately  to maintain his nourishment. .  Continue to monitor hematocrit and hemodynamic status.  Hopefully nursing home transfer till next week  Personally examined patient and images, and have participated in and made any corrections needed to history, physical, neuro exam,assessment and plan as stated above.  I have personally obtained the history, evaluated lab date, reviewed imaging studies and agree with radiology interpretations.    Sarina Ill, MD Stroke Neurology   A total of 25 minutes was spent for the care of this patient, spent on counseling patient and family on different diagnostic and therapeutic options, counseling and coordination of care, riskd ans benefits of management, compliance, or risk factor reduction and education.   To contact Stroke  Continuity provider, please refer to [email or web address] After hours, contact General Neurology

## 2019-08-15 NOTE — Plan of Care (Signed)
  Problem: Coping: Goal: Level of anxiety will decrease Outcome: Progressing   Problem: Safety: Goal: Ability to remain free from injury will improve Outcome: Progressing   Problem: Education: Goal: Knowledge of General Education information will improve Description: Including pain rating scale, medication(s)/side effects and non-pharmacologic comfort measures Outcome: Progressing

## 2019-08-16 ENCOUNTER — Inpatient Hospital Stay (HOSPITAL_COMMUNITY): Payer: Medicare Other

## 2019-08-16 LAB — RENAL FUNCTION PANEL
Albumin: 2.1 g/dL — ABNORMAL LOW (ref 3.5–5.0)
Anion gap: 9 (ref 5–15)
BUN: 27 mg/dL — ABNORMAL HIGH (ref 8–23)
CO2: 23 mmol/L (ref 22–32)
Calcium: 8.3 mg/dL — ABNORMAL LOW (ref 8.9–10.3)
Chloride: 108 mmol/L (ref 98–111)
Creatinine, Ser: 1.32 mg/dL — ABNORMAL HIGH (ref 0.61–1.24)
GFR calc Af Amer: 59 mL/min — ABNORMAL LOW (ref >60)
GFR calc non Af Amer: 51 mL/min — ABNORMAL LOW (ref >60)
Glucose, Bld: 126 mg/dL — ABNORMAL HIGH (ref 70–99)
Phosphorus: 2.7 mg/dL (ref 2.5–4.6)
Potassium: 4 mmol/L (ref 3.5–5.1)
Sodium: 140 mmol/L (ref 135–145)

## 2019-08-16 LAB — GLUCOSE, CAPILLARY
Glucose-Capillary: 114 mg/dL — ABNORMAL HIGH (ref 70–99)
Glucose-Capillary: 115 mg/dL — ABNORMAL HIGH (ref 70–99)
Glucose-Capillary: 118 mg/dL — ABNORMAL HIGH (ref 70–99)
Glucose-Capillary: 121 mg/dL — ABNORMAL HIGH (ref 70–99)
Glucose-Capillary: 126 mg/dL — ABNORMAL HIGH (ref 70–99)
Glucose-Capillary: 127 mg/dL — ABNORMAL HIGH (ref 70–99)
Glucose-Capillary: 134 mg/dL — ABNORMAL HIGH (ref 70–99)

## 2019-08-16 LAB — CBC
HCT: 25.3 % — ABNORMAL LOW (ref 39.0–52.0)
Hemoglobin: 7.7 g/dL — ABNORMAL LOW (ref 13.0–17.0)
MCH: 30 pg (ref 26.0–34.0)
MCHC: 30.4 g/dL (ref 30.0–36.0)
MCV: 98.4 fL (ref 80.0–100.0)
Platelets: 439 10*3/uL — ABNORMAL HIGH (ref 150–400)
RBC: 2.57 MIL/uL — ABNORMAL LOW (ref 4.22–5.81)
RDW: 16.2 % — ABNORMAL HIGH (ref 11.5–15.5)
WBC: 5.4 10*3/uL (ref 4.0–10.5)
nRBC: 0 % (ref 0.0–0.2)

## 2019-08-16 MED ORDER — FREE WATER
200.0000 mL | Status: DC
  Administered 2019-08-16 – 2019-08-26 (×57): 200 mL

## 2019-08-16 NOTE — Progress Notes (Signed)
STROKE TEAM PROGRESS NOTE   INTERVAL HISTORY: No family at bedside.  Patient lying in bed, comfortably.  Talkative, not in distress.  On tube feeding currently, tolerating well.  Hemoglobin 7.7, stable on the low side.  Vitals:   08/16/19 0506 08/16/19 0753 08/16/19 1247 08/16/19 1655  BP: (!) 125/56 (!) 133/54 129/78 (!) 141/65  Pulse: 66 65 64 78  Resp: 18 16 18 18   Temp: 98.1 F (36.7 C) 98 F (36.7 C) 98.4 F (36.9 C) 98 F (36.7 C)  TempSrc: Oral Oral Oral Oral  SpO2: 93% 100% 100% 98%  Weight:      Height:       CBC:  Recent Labs  Lab 08/15/19 0341 08/16/19 0220  WBC 5.6 5.4  HGB 7.8* 7.7*  HCT 25.3* 25.3*  MCV 98.1 98.4  PLT 440* 742*   Basic Metabolic Panel:  Recent Labs  Lab 08/15/19 0341 08/16/19 0220  NA 138 140  K 4.1 4.0  CL 105 108  CO2 22 23  GLUCOSE 97 126*  BUN 29* 27*  CREATININE 1.49* 1.32*  CALCIUM 8.5* 8.3*  PHOS  --  2.7    IMAGING past 24 hours DG CHEST PORT 1 VIEW  Result Date: 08/16/2019 CLINICAL DATA:  Acute ischemic stroke.  Chest pain. EXAM: PORTABLE CHEST 1 VIEW COMPARISON:  08/04/2019 FINDINGS: Grossly unchanged enlarged cardiac silhouette and mediastinal contours with atherosclerotic plaque within thoracic aorta. There is persistent thickening of the right paratracheal stripe presumably secondary prominent vasculature. There is persistent mild elevation/eventration of the right hemidiaphragm with associated right infrahilar and basilar heterogeneous opacities. No definite pleural effusion however the left costophrenic angle is excluded from view. No new focal airspace opacities. No pneumothorax. No evidence of edema. No acute osseous abnormalities. IMPRESSION: 1. Cardiomegaly without superimposed acute cardiopulmonary disease. 2. Chronic elevation of the right hemidiaphragm with associated right basilar atelectasis. Electronically Signed   By: Sandi Mariscal M.D.   On: 08/16/2019 08:43      PHYSICAL EXAM     General - thin, well  developed elderly caucasian male, not in acute distress  Ophthalmologic - fundi not visualized due to noncooperation.  Cardiovascular - Regular rhythm and rate, no M/R/G  Neuro - awake alert, oriented self, year but not to month. Follows simple commands, mild dysarthria, able to name and repeat, but psychomotor slowing, blinks to threat bilaterally, extraocular muscles intact, pupils appear equally round and reactive to light, left lower facial weakness, sensation intact in his face, tongue midline. BUE4/5. BLE4/5 proximal and distal. Can hold limbs anti-gravity without drift.  DTR 1+ and no babinski. Sensation intact subjectively, FTN not cooperative today. Gait not tested.   ASSESSMENT/PLAN Stephen Copeland is a 79 y.o. male with history of Afib, systolic CHF, HLD, CAD, HTN, prostate cancer presenting with left sided weakness, facial droop, dysarthria.   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 embolic secondary to known AF even on Eliquis  Eliquis for VTE prophylaxis  Eliquis (apixaban) daily prior to admission, now on Brilinta (ticagrelor) 90 mg bid. Continue brilinta for total 3 months and then switch to plavix. (per Dr. Estanislado Pandy).  Eliquis now on hold given bloody stool and anemia.  Therapy recommendations:  CIR - > pt w/o family support at home. 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 On metoprolol to 25 bid  . Was on eliquis since [facility] resolved and PEG was placed . Currently off eliquis d/t bloody stools  and anemia  Hypertension  Home meds:  Hydralazine 50 tid, metoprolol 25 bid  On Hydralazine 25 mg Q8 and metoprolol 25 bid  BP stable  Hyperlipidemia  Home meds:  No statin  LDL 90, goal < 70  pt refuses statin  AKI on CKD  Stage III  Cre 1.68->1.65 -> 1.49->1.32   Renal US R w/ increased echogenicity. No hydro  on TF and FW  Leukocytosis, resolved  WBC 5.6->5.4  afebrile  UA neg  CXR unremarkable  Continue monitoring  Chronic HFrEF Dilated Cardiomyopathy LBBB Prolonged QT  EF 30 to 40% since 2017  This admission EF 35 to 40%  CXR 6/1 no pulmonary edema  6/2 mild SOB with frequent coughing, concerning for fluid overload with IVF, TF and FW -> lasix IV 40mg  x 1  Dysphagia . Secondary to stroke . SLP difficulty d/t pt participation. . D1 puree w/ honey thick liquids -> pt not swallowing and eating enough for daily needs -> Back to continuous feeding 5/30 . S/p PEG 08/04/19 and 08/13/19 . Removal of PEG sutures 6/8 . Speech and dietician on board  BRBPR  Episodes of N, V and diarrhea throughout admission.  Pt did not tolerate CorTrak -> PEG place 08/04/19 and re-insert 08/13/19  Jevity TF 60 ml / hour with free water 150 mls Q 4 hrs  Portable Abd 08/02/19 - Less prominent dilatation of small bowel compared to the prior study with probable component of minimal residual ileus  Imodium 2 mg per tube Q 6 hrs for diarrhea since 07/25/19. Small blood clots in loose stool with recurrence. (No indication for colonoscopy)   Protonix 40 mg Bid per tube since 07/19/19 for GERD  Eliquis again on hold - pt still getting Brilinta  Hgb - 9.7->9.5->9.0->9.0.Marland KitchenMarland KitchenMarland Kitchen8.5->7.8->7.7  Will hold eliquis at d/c  Other Stroke Risk Factors  Advanced age  Coronary artery disease  Other Active Problems   BPH on flomax PTA  Prostate cancer   Hypokalemia - resolved   LBBB. Appears old. EKG neg ischemia. Trops 27->32  (Likely strain related)  Thrombocytosis -  resolved  Hospital Day #38   Stephen Hawking, MD PhD Stroke Neurology 08/16/2019 6:46 PM    To contact Stroke Continuity provider, please refer to [email or web address] After hours, contact General Neurology

## 2019-08-16 NOTE — Plan of Care (Signed)
  Problem: Pain Managment: Goal: General experience of comfort will improve Outcome: Progressing   Problem: Skin Integrity: Goal: Risk for impaired skin integrity will decrease Outcome: Progressing   Problem: Education: Goal: Knowledge of General Education information will improve Description: Including pain rating scale, medication(s)/side effects and non-pharmacologic comfort measures Outcome: Progressing

## 2019-08-16 NOTE — Progress Notes (Signed)
Alert sat up in bed ate a few bites of grits and jelly. Drank some coffee.

## 2019-08-17 LAB — GLUCOSE, CAPILLARY
Glucose-Capillary: 109 mg/dL — ABNORMAL HIGH (ref 70–99)
Glucose-Capillary: 131 mg/dL — ABNORMAL HIGH (ref 70–99)
Glucose-Capillary: 123 mg/dL — ABNORMAL HIGH (ref 70–99)
Glucose-Capillary: 142 mg/dL — ABNORMAL HIGH (ref 70–99)
Glucose-Capillary: 122 mg/dL — ABNORMAL HIGH (ref 70–99)
Glucose-Capillary: 122 mg/dL — ABNORMAL HIGH (ref 70–99)

## 2019-08-17 LAB — RENAL FUNCTION PANEL
Albumin: 2.3 g/dL — ABNORMAL LOW (ref 3.5–5.0)
Anion gap: 8 (ref 5–15)
BUN: 23 mg/dL (ref 8–23)
CO2: 26 mmol/L (ref 22–32)
Calcium: 8.5 mg/dL — ABNORMAL LOW (ref 8.9–10.3)
Chloride: 104 mmol/L (ref 98–111)
Creatinine, Ser: 1.28 mg/dL — ABNORMAL HIGH (ref 0.61–1.24)
GFR calc Af Amer: >60 mL/min (ref >60)
GFR calc non Af Amer: 53 mL/min — ABNORMAL LOW (ref >60)
Glucose, Bld: 125 mg/dL — ABNORMAL HIGH (ref 70–99)
Phosphorus: 2.4 mg/dL — ABNORMAL LOW (ref 2.5–4.6)
Potassium: 3.9 mmol/L (ref 3.5–5.1)
Sodium: 138 mmol/L (ref 135–145)

## 2019-08-17 LAB — CBC
HCT: 27.4 % — ABNORMAL LOW (ref 39.0–52.0)
Hemoglobin: 8.4 g/dL — ABNORMAL LOW (ref 13.0–17.0)
MCH: 29.8 pg (ref 26.0–34.0)
MCHC: 30.7 g/dL (ref 30.0–36.0)
MCV: 97.2 fL (ref 80.0–100.0)
Platelets: 450 10*3/uL — ABNORMAL HIGH (ref 150–400)
RBC: 2.82 MIL/uL — ABNORMAL LOW (ref 4.22–5.81)
RDW: 16.2 % — ABNORMAL HIGH (ref 11.5–15.5)
WBC: 7.2 10*3/uL (ref 4.0–10.5)
nRBC: 0 % (ref 0.0–0.2)

## 2019-08-17 NOTE — Progress Notes (Signed)
STROKE TEAM PROGRESS NOTE   INTERVAL HISTORY: RN at bedside. Pt lying in bed, vitals stable, labs stable, no complaints.  Pending SNF.  Vitals:   08/17/19 0355 08/17/19 0737 08/17/19 1157 08/17/19 1513  BP: 139/62 126/70 126/68 117/66  Pulse: 71 73 90 70  Resp: 18 19 18 20   Temp: 99.9 F (37.7 C) 97.7 F (36.5 C) 98.9 F (37.2 C) 98.5 F (36.9 C)  TempSrc: Oral Oral Oral Oral  SpO2: 95% 94% 94% 98%  Weight:      Height:       CBC:  Recent Labs  Lab 08/16/19 0220 08/17/19 0817  WBC 5.4 7.2  HGB 7.7* 8.4*  HCT 25.3* 27.4*  MCV 98.4 97.2  PLT 439* 326*   Basic Metabolic Panel:  Recent Labs  Lab 08/16/19 0220 08/17/19 0817  NA 140 138  K 4.0 3.9  CL 108 104  CO2 23 26  GLUCOSE 126* 125*  BUN 27* 23  CREATININE 1.32* 1.28*  CALCIUM 8.3* 8.5*  PHOS 2.7 2.4*    IMAGING past 24 hours No results found.    PHYSICAL EXAM General - thin, well developed elderly caucasian male, not in acute distress  Ophthalmologic - fundi not visualized due to noncooperation.  Cardiovascular - Regular rhythm and rate, no M/R/G  Neuro - awake alert, oriented self, year but not to month. Follows simple commands, mild dysarthria, able to name and repeat, but psychomotor slowing, blinks to threat bilaterally, extraocular muscles intact, pupils appear equally round and reactive to light, left lower facial weakness, sensation intact in his face, tongue midline. BUE4/5. BLE4/5 proximal and distal. Can hold limbs anti-gravity without drift.  DTR 1+ and no babinski. Sensation intact subjectively, FTN not cooperative today. Gait not tested.   ASSESSMENT/PLAN [name] is a 79 y.o. male with history of Afib, systolic CHF, HLD, CAD, HTN, prostate cancer presenting with left sided weakness, facial droop, dysarthria.   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 embolic secondary to known AF even on Eliquis  Eliquis for VTE prophylaxis  Eliquis (apixaban) daily prior to admission, now on Brilinta (ticagrelor) 90 mg bid. Continue brilinta for total 3 months and then switch to plavix. (per Dr. Estanislado Pandy).  Eliquis now on hold given bloody stool and anemia.  Therapy recommendations:  CIR - > pt w/o family support at home. 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 On metoprolol to 25 bid  . Was on eliquis since [facility] resolved and PEG was placed . Currently off eliquis d/t bloody stools and anemia . Medically ready for d/c to SNF once bed found  Hypertension  Home meds:  Hydralazine 50 tid, metoprolol 25 bid  On Hydralazine 25 mg Q8 and metoprolol 25 bid  BP stable  Hyperlipidemia  Home meds:  No statin  LDL 90, goal < 70  pt refuses statin  AKI on CKD  Stage III  Cre 1.68->1.65 -> 1.49->1.32->1.28   Renal US R  w/ increased echogenicity. 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 Secondary to stroke . SLP difficulty d/t pt participation. . D1 puree w/ honey thick liquids -> pt not swallowing and eating enough for daily needs -> Back to continuous feeding 5/30 . S/p PEG 08/04/19 and 08/13/19 . Speech and dietician on board  BRBPR  Episodes  of N, V and diarrhea throughout admission.  Pt did not tolerate CorTrak -> PEG place 08/04/19 and re-insert 08/13/19  Jevity TF 60 ml / hour with free water 150 mls Q 4 hrs  Portable Abd 08/02/19 - Less prominent dilatation of small bowel compared to the prior study with probable component of minimal residual ileus  Imodium 2 mg per tube Q 6 hrs for diarrhea since 07/25/19. Small blood clots in loose stool with recurrence. (No indication for colonoscopy)   Protonix 40 mg Bid per tube since 07/19/19 for GERD  Eliquis again on hold - pt still getting Brilinta  Hgb - 9.7....8.5->7.8->7.7->8.4  Will hold eliquis at d/c  Other Stroke Risk Factors  Advanced age  Coronary artery disease  Other Active Problems   BPH on flomax PTA  Prostate cancer   Hypokalemia - resolved   LBBB. Appears old. EKG neg ischemia. Trops 27->32  (Likely strain related)  Thrombocytosis - resolved  Hospital Day #39   [name], MD PhD Stroke Neurology 08/17/2019 3:47 PM    To contact Stroke Continuity provider, please refer to [email or web address] After hours, contact General Neurology

## 2019-08-17 NOTE — Plan of Care (Signed)
Problem: Education: Goal: Knowledge of General Education information will improve Description: Including pain rating scale, medication(s)/side effects and non-pharmacologic comfort measures Outcome: Progressing   Problem: Health Behavior/Discharge Planning: Goal: Ability to manage health-related needs will improve Outcome: Progressing   Problem: Clinical Measurements: Goal: Ability to maintain clinical measurements within normal limits will improve Outcome: Progressing Goal: Will remain free from infection Outcome: Progressing Goal: Diagnostic test results will improve Outcome: Progressing Goal: Respiratory complications will improve Outcome: Progressing Goal: Cardiovascular complication will be avoided Outcome: Progressing   Problem: Activity: Goal: Risk for activity intolerance will decrease Outcome: Progressing   Problem: Nutrition: Goal: Adequate nutrition will be maintained Outcome: Progressing   Problem: Coping: Goal: Level of anxiety will decrease Outcome: Progressing   Problem: Elimination: Goal: Will not experience complications related to bowel motility Outcome: Progressing Goal: Will not experience complications related to urinary retention Outcome: Progressing   Problem: Pain Managment: Goal: General experience of comfort will improve Outcome: Progressing   Problem: Safety: Goal: Ability to remain free from injury will improve Outcome: Progressing   Problem: Skin Integrity: Goal: Risk for impaired skin integrity will decrease Outcome: Progressing   Problem: Education: Goal: Knowledge of General Education information will improve Description: Including pain rating scale, medication(s)/side effects and non-pharmacologic comfort measures Outcome: Progressing   Problem: Health Behavior/Discharge Planning: Goal: Ability to manage health-related needs will improve Outcome: Progressing   Problem: Clinical Measurements: Goal: Ability to maintain  clinical measurements within normal limits will improve Outcome: Progressing Goal: Will remain free from infection Outcome: Progressing Goal: Diagnostic test results will improve Outcome: Progressing Goal: Respiratory complications will improve Outcome: Progressing Goal: Cardiovascular complication will be avoided Outcome: Progressing   Problem: Activity: Goal: Risk for activity intolerance will decrease Outcome: Progressing   Problem: Nutrition: Goal: Adequate nutrition will be maintained Outcome: Progressing   Problem: Coping: Goal: Level of anxiety will decrease Outcome: Progressing   Problem: Elimination: Goal: Will not experience complications related to bowel motility Outcome: Progressing Goal: Will not experience complications related to urinary retention Outcome: Progressing   Problem: Pain Managment: Goal: General experience of comfort will improve Outcome: Progressing   Problem: Safety: Goal: Ability to remain free from injury will improve Outcome: Progressing   Problem: Skin Integrity: Goal: Risk for impaired skin integrity will decrease Outcome: Progressing   Problem: Education: Goal: Knowledge of patient specific risk factors addressed and post discharge goals established will improve Outcome: Progressing Goal: Individualized Educational Video(s) Outcome: Progressing   Problem: Coping: Goal: Will verbalize positive feelings about self Outcome: Progressing Goal: Will identify appropriate support needs Outcome: Progressing   Problem: Health Behavior/Discharge Planning: Goal: Ability to manage health-related needs will improve Outcome: Progressing   Problem: Self-Care: Goal: Ability to participate in self-care as condition permits will improve Outcome: Progressing Goal: Verbalization of feelings and concerns over difficulty with self-care will improve Outcome: Progressing Goal: Ability to communicate needs accurately will improve Outcome:  Progressing   Problem: Nutrition: Goal: Risk of aspiration will decrease Outcome: Progressing Goal: Dietary intake will improve Outcome: Progressing   Problem: Ischemic Stroke/TIA Tissue Perfusion: Goal: Complications of ischemic stroke/TIA will be minimized Outcome: Progressing   Problem: Education: Goal: Knowledge of disease or condition will improve Outcome: Progressing Goal: Knowledge of secondary prevention will improve Outcome: Progressing Goal: Knowledge of patient specific risk factors addressed and post discharge goals established will improve Outcome: Progressing Goal: Individualized Educational Video(s) Outcome: Progressing   Problem: Coping: Goal: Will verbalize positive feelings about self Outcome: Progressing Goal: Will identify appropriate support needs  Outcome: Progressing   Problem: Health Behavior/Discharge Planning: Goal: Ability to manage health-related needs will improve Outcome: Progressing   Problem: Self-Care: Goal: Ability to participate in self-care as condition permits will improve Outcome: Progressing Goal: Verbalization of feelings and concerns over difficulty with self-care will improve Outcome: Progressing Goal: Ability to communicate needs accurately will improve Outcome: Progressing   Problem: Ischemic Stroke/TIA Tissue Perfusion: Goal: Complications of ischemic stroke/TIA will be minimized Outcome: Progressing   Problem: Nutrition: Goal: Risk of aspiration will decrease Outcome: Progressing Goal: Dietary intake will improve Outcome: Progressing   Problem: Education: Goal: Knowledge of disease or condition will improve Outcome: Progressing Goal: Knowledge of secondary prevention will improve Outcome: Progressing Goal: Individualized Educational Video(s) Outcome: Progressing   Problem: Intracerebral Hemorrhage Tissue Perfusion: Goal: Complications of Intracerebral Hemorrhage will be minimized Outcome: Progressing   Problem:  Ischemic Stroke/TIA Tissue Perfusion: Goal: Complications of ischemic stroke/TIA will be minimized Outcome: Progressing   Problem: Spontaneous Subarachnoid Hemorrhage Tissue Perfusion: Goal: Complications of Spontaneous Subarachnoid Hemorrhage will be minimized Outcome: Progressing

## 2019-08-17 NOTE — Progress Notes (Signed)
PT Cancellation Note  Patient Details Name: Stephen Copeland MRN: [id number] DOB: September 01, 1940   Cancelled Treatment:    Reason Eval/Treat Not Completed: Patient declined, no reason specified Attempted to see pt for PT treatment. Pt declined participating in therapy despite max encouragement and education on benefits of OOB mobility. PT will continue to follow acutely.  Earney Navy, PTA Acute Rehabilitation Services Pager: [phone] Office: [phone]   08/17/2019, 1:09 PM

## 2019-08-17 NOTE — TOC Progression Note (Signed)
Transition of Care [facility]) - Progression Note    Patient Details  Name: Stephen Copeland MRN: [id number] Date of Birth: 1940-06-17  Transition of Care [facility]) CM/SW [place], [place] Phone Number: 08/17/2019, 10:42 AM  Clinical Narrative:   CSW alerted by [facility] that they are concerned about meeting the patient's needs with his continued refusal of therapy and labs. CSW asked PT to see patient, hopeful for better participation today. CSW to follow.    Expected Discharge Plan: [place] Barriers to Discharge: Continued Medical Work up  Expected Discharge Plan and Services Expected Discharge Plan: [place] arrangements for the past 2 months: Single Family Home                                       Social Determinants of Health (SDOH) Interventions    Readmission Risk Interventions No flowsheet data found.

## 2019-08-18 ENCOUNTER — Inpatient Hospital Stay (HOSPITAL_COMMUNITY): Payer: Medicare Other

## 2019-08-18 DIAGNOSIS — Z931 Gastrostomy status: Secondary | ICD-10-CM

## 2019-08-18 LAB — RENAL FUNCTION PANEL
Albumin: 2.5 g/dL — ABNORMAL LOW (ref 3.5–5.0)
Anion gap: 9 (ref 5–15)
BUN: 25 mg/dL — ABNORMAL HIGH (ref 8–23)
CO2: 29 mmol/L (ref 22–32)
Calcium: 9 mg/dL (ref 8.9–10.3)
Chloride: 102 mmol/L (ref 98–111)
Creatinine, Ser: 1.31 mg/dL — ABNORMAL HIGH (ref 0.61–1.24)
GFR calc Af Amer: >60 mL/min (ref >60)
GFR calc non Af Amer: 52 mL/min — ABNORMAL LOW (ref >60)
Glucose, Bld: 113 mg/dL — ABNORMAL HIGH (ref 70–99)
Phosphorus: 3 mg/dL (ref 2.5–4.6)
Potassium: 4.1 mmol/L (ref 3.5–5.1)
Sodium: 140 mmol/L (ref 135–145)

## 2019-08-18 LAB — CBC
HCT: 30.5 % — ABNORMAL LOW (ref 39.0–52.0)
Hemoglobin: 9.3 g/dL — ABNORMAL LOW (ref 13.0–17.0)
MCH: 29.9 pg (ref 26.0–34.0)
MCHC: 30.5 g/dL (ref 30.0–36.0)
MCV: 98.1 fL (ref 80.0–100.0)
Platelets: 452 10*3/uL — ABNORMAL HIGH (ref 150–400)
RBC: 3.11 MIL/uL — ABNORMAL LOW (ref 4.22–5.81)
RDW: 16.2 % — ABNORMAL HIGH (ref 11.5–15.5)
WBC: 12 10*3/uL — ABNORMAL HIGH (ref 4.0–10.5)
nRBC: 0 % (ref 0.0–0.2)

## 2019-08-18 LAB — GLUCOSE, CAPILLARY
Glucose-Capillary: 113 mg/dL — ABNORMAL HIGH (ref 70–99)
Glucose-Capillary: 133 mg/dL — ABNORMAL HIGH (ref 70–99)
Glucose-Capillary: 140 mg/dL — ABNORMAL HIGH (ref 70–99)
Glucose-Capillary: 143 mg/dL — ABNORMAL HIGH (ref 70–99)
Glucose-Capillary: 159 mg/dL — ABNORMAL HIGH (ref 70–99)
Glucose-Capillary: 91 mg/dL (ref 70–99)

## 2019-08-18 MED ORDER — SODIUM CHLORIDE 0.9 % IV SOLN: INTRAVENOUS | Status: DC

## 2019-08-18 NOTE — Progress Notes (Signed)
Nutrition Follow-up  RD working remotely.   DOCUMENTATION CODES:   Not applicable  INTERVENTION:  - continue Jevity 1.5 @ 60 ml/hr with 30 ml prostat (or equivalent) once/day and 200 ml free water every 4 hours. - will consider transitioning from continuous feeds to bolus feeds prior to d/c.   NUTRITION DIAGNOSIS:   Inadequate oral intake related to dysphagia as evidenced by other (comment) (need for Dysphagia 1, honey thick liquids diet with minimal intake; need for PEG.). -revised, ongoing  GOAL:   Patient will meet greater than or equal to 90% of their needs -met with TF regimen  MONITOR:   PO intake, TF tolerance, Labs, Weight trends  ASSESSMENT:   Pt with PMH of CHF, CAD, cardiomyopathy, hiatal hernia, HLD, and HTN now admitted with R CVA s/p thrombectomy and carotid stent.  Significant Events: 5/6 admitted s/p IR, remained intubated post-op 5/7 extubated; cortrak placed  5/10 N/V with normal KUB; intubated 5/11 extubated, cortrak removed 5/12 failed swallow eval; cortrak replaced as tube was inadvertently pulled with extubation 5/17 diet upgraded to dysphagia 1 with honey thick liquids 5/18 TF switched to nocturnal 5/20 TF returned to continuous due to poor po intake 6/1 s/p EGD and PEG placement 6/3 diet advanced from NPO to Dysphagia 1, honey-thick liquids 6/5 pt with bloody stools, TF held 6/7 TF resumed   6/10 PEG adjustment 6/12 Ensure Enlive ordered BID   Dysphagia 1, honey-thick liquid diet was ordered on 6/3. Per review of flow sheet documentation, he has been eating 0-5% of meals since 6/10. Ensure Enlive was ordered BID on 6/12 and patient has accepted this supplement 4 of the 6 times offered. Will make note that this should be thickened to honey thick.   He has PEG in place and is receiving Jevity 1.5 @ 60 ml/hr with 30 ml prostat once/day and 200 ml free water every 4 hours via PEG. This regimen is providing 2260 kcal, 107 grams protein, and 2294 ml  free water.   Weight has fluctuated slightly up and down over the past 10 days. Flow sheet documentation indicates mild edema to BLE.   Per notes: - plan for SNF at the time of d/c.     Labs reviewed; CBGs: 91 and 159 mg/dl, BUN: 25 mg/dl, creatinine: 1.31 mg/dl, GFR: 52 ml/min. Medications reviewed; sliding scale novolog, resource thickenup PRN.    Diet Order:   Diet Order            DIET - DYS 1 Room service appropriate? Yes; Fluid consistency: Honey Thick  Diet effective 1400                 EDUCATION NEEDS:   No education needs have been identified at this time  Skin:  Skin Assessment: Skin Integrity Issues: Skin Integrity Issues:: Other (Comment), Incisions Incisions: groin Other: MASD groin  Last BM:  6/14  Height:   Ht Readings from Last 1 Encounters:  08/04/19 '6\' 2"'  (1.88 m)    Weight:   Wt Readings from Last 1 Encounters:  08/18/19 81.2 kg    Ideal Body Weight:  86.3 kg  BMI:  Body mass index is 22.98 kg/m.  Estimated Nutritional Needs:   Kcal:  2100-2300  Protein:  100-115 grams  Fluid:  >2 L/day     Jarome Matin, MS, RD, LDN, CNSC Inpatient Clinical Dietitian RD pager # available in [place]  After hours/weekend pager # available in [facility]

## 2019-08-18 NOTE — Progress Notes (Signed)
Patient has had 3 small brown episodes of vomiting since notifying MD. Patient is on continuous tube feed and has 133ml of residual. Message sent to Dr. Erlinda Hong awaiting callback at this time.

## 2019-08-18 NOTE — Progress Notes (Signed)
Received verbal order from Dr. Erlinda Hong to hold tube feed for now, start NS at 50/hr  And order KUB.

## 2019-08-18 NOTE — Plan of Care (Signed)
Problem: Education: Goal: Knowledge of General Education information will improve Description: Including pain rating scale, medication(s)/side effects and non-pharmacologic comfort measures Outcome: Progressing   Problem: Health Behavior/Discharge Planning: Goal: Ability to manage health-related needs will improve Outcome: Progressing   Problem: Clinical Measurements: Goal: Ability to maintain clinical measurements within normal limits will improve Outcome: Progressing Goal: Will remain free from infection Outcome: Progressing Goal: Diagnostic test results will improve Outcome: Progressing Goal: Respiratory complications will improve Outcome: Progressing Goal: Cardiovascular complication will be avoided Outcome: Progressing   Problem: Activity: Goal: Risk for activity intolerance will decrease Outcome: Progressing   Problem: Nutrition: Goal: Adequate nutrition will be maintained Outcome: Progressing   Problem: Coping: Goal: Level of anxiety will decrease Outcome: Progressing   Problem: Elimination: Goal: Will not experience complications related to bowel motility Outcome: Progressing Goal: Will not experience complications related to urinary retention Outcome: Progressing   Problem: Pain Managment: Goal: General experience of comfort will improve Outcome: Progressing   Problem: Safety: Goal: Ability to remain free from injury will improve Outcome: Progressing   Problem: Skin Integrity: Goal: Risk for impaired skin integrity will decrease Outcome: Progressing   Problem: Education: Goal: Knowledge of General Education information will improve Description: Including pain rating scale, medication(s)/side effects and non-pharmacologic comfort measures Outcome: Progressing   Problem: Health Behavior/Discharge Planning: Goal: Ability to manage health-related needs will improve Outcome: Progressing   Problem: Clinical Measurements: Goal: Ability to maintain  clinical measurements within normal limits will improve Outcome: Progressing Goal: Will remain free from infection Outcome: Progressing Goal: Diagnostic test results will improve Outcome: Progressing Goal: Respiratory complications will improve Outcome: Progressing Goal: Cardiovascular complication will be avoided Outcome: Progressing   Problem: Activity: Goal: Risk for activity intolerance will decrease Outcome: Progressing   Problem: Nutrition: Goal: Adequate nutrition will be maintained Outcome: Progressing   Problem: Coping: Goal: Level of anxiety will decrease Outcome: Progressing   Problem: Elimination: Goal: Will not experience complications related to bowel motility Outcome: Progressing Goal: Will not experience complications related to urinary retention Outcome: Progressing   Problem: Pain Managment: Goal: General experience of comfort will improve Outcome: Progressing   Problem: Safety: Goal: Ability to remain free from injury will improve Outcome: Progressing   Problem: Education: Goal: Knowledge of patient specific risk factors addressed and post discharge goals established will improve Outcome: Progressing Goal: Individualized Educational Video(s) Outcome: Progressing   Problem: Coping: Goal: Will verbalize positive feelings about self Outcome: Progressing Goal: Will identify appropriate support needs Outcome: Progressing   Problem: Health Behavior/Discharge Planning: Goal: Ability to manage health-related needs will improve Outcome: Progressing   Problem: Self-Care: Goal: Ability to participate in self-care as condition permits will improve Outcome: Progressing Goal: Verbalization of feelings and concerns over difficulty with self-care will improve Outcome: Progressing Goal: Ability to communicate needs accurately will improve Outcome: Progressing   Problem: Nutrition: Goal: Risk of aspiration will decrease Outcome: Progressing Goal:  Dietary intake will improve Outcome: Progressing   Problem: Ischemic Stroke/TIA Tissue Perfusion: Goal: Complications of ischemic stroke/TIA will be minimized Outcome: Progressing   Problem: Coping: Goal: Will verbalize positive feelings about self Outcome: Progressing Goal: Will identify appropriate support needs Outcome: Progressing   Problem: Health Behavior/Discharge Planning: Goal: Ability to manage health-related needs will improve Outcome: Progressing   Problem: Self-Care: Goal: Ability to participate in self-care as condition permits will improve Outcome: Progressing Goal: Verbalization of feelings and concerns over difficulty with self-care will improve Outcome: Progressing Goal: Ability to communicate needs accurately will improve Outcome: Progressing  Problem: Nutrition: Goal: Risk of aspiration will decrease Outcome: Progressing Goal: Dietary intake will improve Outcome: Progressing   Problem: Ischemic Stroke/TIA Tissue Perfusion: Goal: Complications of ischemic stroke/TIA will be minimized Outcome: Progressing   Problem: Education: Goal: Knowledge of disease or condition will improve Outcome: Progressing Goal: Knowledge of secondary prevention will improve Outcome: Progressing Goal: Individualized Educational Video(s) Outcome: Progressing   Problem: Ischemic Stroke/TIA Tissue Perfusion: Goal: Complications of ischemic stroke/TIA will be minimized Outcome: Progressing   Problem: Spontaneous Subarachnoid Hemorrhage Tissue Perfusion: Goal: Complications of Spontaneous Subarachnoid Hemorrhage will be minimized Outcome: Progressing

## 2019-08-18 NOTE — Progress Notes (Addendum)
STROKE TEAM PROGRESS NOTE   INTERVAL HISTORY: Wife at bedside. Pt a little sleepy today with several episode of nausea and small amount of vomiting of yellow vomitus. Will hold off tube feeding and put on IVF. Will do KUB.    Vitals:   08/18/19 0529 08/18/19 0730 08/18/19 1151 08/18/19 1516  BP: 115/61 (!) 132/93 130/73 (!) 149/88  Pulse: 80 (!) 102 (!) 108 (!) 104  Resp:  19 20 20   Temp:  98.7 F (37.1 C) 99.5 F (37.5 C) 99.8 F (37.7 C)  TempSrc:  Oral Oral Oral  SpO2: 97% 92% 96% 94%  Weight:      Height:       CBC:  Recent Labs  Lab 08/17/19 0817 08/18/19 0426  WBC 7.2 12.0*  HGB 8.4* 9.3*  HCT 27.4* 30.5*  MCV 97.2 98.1  PLT 450* 824*   Basic Metabolic Panel:  Recent Labs  Lab 08/17/19 0817 08/18/19 0426  NA 138 140  K 3.9 4.1  CL 104 102  CO2 26 29  GLUCOSE 125* 113*  BUN 23 25*  CREATININE 1.28* 1.31*  CALCIUM 8.5* 9.0  PHOS 2.4* 3.0    IMAGING past 24 hours No results found.    PHYSICAL EXAM  General - thin, well developed elderly caucasian male, mildly sleepy with intermittent nausea  Ophthalmologic - fundi not visualized due to noncooperation.  Cardiovascular - Regular rhythm and rate, no M/R/G  Neuro - mildly sleepy but oriented self, year but not to month. Follows simple commands, mild dysarthria, able to name and repeat, but psychomotor slowing, blinks to threat bilaterally, extraocular muscles intact, pupils appear equally round and reactive to light, left lower facial weakness, sensation intact in his face, tongue midline. BUE4/5. BLE4/5 proximal and distal. Can hold limbs anti-gravity without drift.  DTR 1+ and no babinski. Sensation intact subjectively, FTN not cooperative today. Gait not tested.   ASSESSMENT/PLAN [name] is a 79 y.o. male with history of Afib, systolic CHF, HLD, CAD, HTN, prostate cancer presenting with left sided weakness, facial droop, dysarthria.   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 embolic secondary to known AF even on Eliquis  Eliquis for VTE prophylaxis  Eliquis (apixaban) daily prior to admission, now on Brilinta (ticagrelor) 90 mg bid. Continue brilinta for total 3 months and then switch to plavix. (per Dr. Estanislado Pandy).  Eliquis now on hold given bloody stool and anemia.  Therapy recommendations:  CIR - > pt w/o family support at home. Will need SNF  Disposition:  SNF - poor participation in therapies. 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 On metoprolol to 25 bid  . Was on eliquis since [facility] resolved and PEG was placed . Currently off eliquis d/t bloody stools and anemia  Hypertension  Home meds:  Hydralazine 50 tid, metoprolol 25 bid  On Hydralazine 25 mg Q8 and metoprolol 25 bid  BP stable  Hyperlipidemia  Home meds:  No statin  LDL  90, goal < 70  pt refuses statin  AKI on CKD  Stage III  Cre 1.68->1.65 -> 1.49->1.32->1.28->1.31   Renal US R w/ increased echogenicity. No hydro  on TF and FW  Leukocytosis  WBC 7.2->12.0  afebrile  UA neg  CXR unremarkable  Continue monitoring  Chronic HFrEF Dilated Cardiomyopathy LBBB Prolonged QT  EF 30 to 40% since 2017  This admission EF 35 to 40%  CXR 6/1 no pulmonary edema  6/2 mild SOB with frequent coughing, concerning for fluid overload with IVF, TF and FW -> lasix IV 40mg  x 1  Dysphagia . Secondary to stroke . SLP difficulty d/t pt participation. . D1 puree w/ honey thick liquids -> pt not swallowing and eating enough  for daily needs -> Back to continuous feeding 5/30 . S/p PEG 08/04/19 and 08/13/19 . Speech and dietician on board . Intermittent nausea with small yellow vomitus -> hold off TF and on IVF . KUB pending  BRBPR  Episodes of N, V and diarrhea throughout admission.  Pt did not tolerate CorTrak -> PEG place 08/04/19 and re-insert 08/13/19  Jevity TF 60 ml / hour with free water 150 mls Q 4 hrs  Portable Abd 08/02/19 - Less prominent dilatation of small bowel compared to the prior study with probable component of minimal residual ileus  Imodium 2 mg per tube Q 6 hrs for diarrhea since 07/25/19. Small blood clots in loose stool with recurrence. (No indication for colonoscopy)   Protonix 40 mg Bid per tube since 07/19/19 for GERD  Eliquis again on hold - pt still getting Brilinta  Hgb - 9.7....8.5->7.8->7.7->8.4->9.3  Will hold eliquis at d/c  Other Stroke Risk Factors  Advanced age  Coronary artery disease  Other Active Problems   BPH on flomax PTA  Prostate cancer   Hypokalemia - resolved   LBBB. Appears old. EKG neg ischemia. Trops 27->32  (Likely strain related)  Thrombocytosis - resolved  Hospital Day #40   Stephen Hawking, MD PhD Stroke Neurology 08/18/2019 4:32 PM    To contact Stroke Continuity provider, please refer to [email or web address] After hours, contact General Neurology

## 2019-08-18 NOTE — Plan of Care (Signed)
  Problem: Clinical Measurements: Goal: Respiratory complications will improve Outcome: Progressing Goal: Cardiovascular complication will be avoided Outcome: Progressing   Problem: Coping: Goal: Level of anxiety will decrease Outcome: Progressing   

## 2019-08-18 NOTE — Progress Notes (Signed)
Patient had one episode of vomiting, small amount, yellow in color noted to the front of patient gown. Patient stated "I think I threw-up from coughing too much." Dr. Erlinda Hong on the unit at this time and was notified. No new orders at this time.

## 2019-08-19 LAB — RENAL FUNCTION PANEL
Albumin: 2.2 g/dL — ABNORMAL LOW (ref 3.5–5.0)
Anion gap: 8 (ref 5–15)
BUN: 32 mg/dL — ABNORMAL HIGH (ref 8–23)
CO2: 26 mmol/L (ref 22–32)
Calcium: 8.6 mg/dL — ABNORMAL LOW (ref 8.9–10.3)
Chloride: 102 mmol/L (ref 98–111)
Creatinine, Ser: 1.38 mg/dL — ABNORMAL HIGH (ref 0.61–1.24)
GFR calc Af Amer: 56 mL/min — ABNORMAL LOW (ref >60)
GFR calc non Af Amer: 49 mL/min — ABNORMAL LOW (ref >60)
Glucose, Bld: 124 mg/dL — ABNORMAL HIGH (ref 70–99)
Phosphorus: 2.9 mg/dL (ref 2.5–4.6)
Potassium: 4.3 mmol/L (ref 3.5–5.1)
Sodium: 136 mmol/L (ref 135–145)

## 2019-08-19 LAB — CBC
HCT: 27.2 % — ABNORMAL LOW (ref 39.0–52.0)
Hemoglobin: 8.5 g/dL — ABNORMAL LOW (ref 13.0–17.0)
MCH: 29.9 pg (ref 26.0–34.0)
MCHC: 31.3 g/dL (ref 30.0–36.0)
MCV: 95.8 fL (ref 80.0–100.0)
Platelets: 397 10*3/uL (ref 150–400)
RBC: 2.84 MIL/uL — ABNORMAL LOW (ref 4.22–5.81)
RDW: 16.2 % — ABNORMAL HIGH (ref 11.5–15.5)
WBC: 8.6 10*3/uL (ref 4.0–10.5)
nRBC: 0 % (ref 0.0–0.2)

## 2019-08-19 LAB — GLUCOSE, CAPILLARY
Glucose-Capillary: 112 mg/dL — ABNORMAL HIGH (ref 70–99)
Glucose-Capillary: 106 mg/dL — ABNORMAL HIGH (ref 70–99)
Glucose-Capillary: 111 mg/dL — ABNORMAL HIGH (ref 70–99)
Glucose-Capillary: 103 mg/dL — ABNORMAL HIGH (ref 70–99)
Glucose-Capillary: 124 mg/dL — ABNORMAL HIGH (ref 70–99)

## 2019-08-19 MED ORDER — JEVITY 1.5 CAL/FIBER PO LIQD
1000.0000 mL | ORAL | Status: DC
  Administered 2019-08-19 – 2019-08-25 (×5): 1000 mL
  Filled 2019-08-19 (×10): qty 1000

## 2019-08-19 MED ORDER — ZOLPIDEM TARTRATE 5 MG PO TABS: 5.0000 mg | ORAL_TABLET | Freq: Every evening | ORAL | Status: DC | PRN

## 2019-08-19 MED ORDER — ZOLPIDEM TARTRATE 5 MG PO TABS
5.0000 mg | ORAL_TABLET | Freq: Every evening | ORAL | Status: AC | PRN
  Administered 2019-08-19: 5 mg via ORAL
  Filled 2019-08-19: qty 1

## 2019-08-19 MED ORDER — METOCLOPRAMIDE HCL 10 MG PO TABS
5.0000 mg | ORAL_TABLET | Freq: Three times a day (TID) | ORAL | Status: DC
  Administered 2019-08-19 – 2019-08-22 (×9): 5 mg
  Filled 2019-08-19 (×9): qty 1

## 2019-08-19 NOTE — Progress Notes (Signed)
Patient resting in bed. Alert and oriented to person and place, reorientation provided. AM meds held. Patient with small amount of brown emesis, PRN zofran given earlier in shift. Brown residual noted from G-tube. Dressing to site changed. KUB done and results noted. Tube feeding remains turned off from prior shift. VSS. HOB elevated, will continue to monitor.

## 2019-08-19 NOTE — Progress Notes (Signed)
OT Cancellation Note  Patient Details Name: Stephen Copeland MRN: [id number] DOB: [birth year]   Cancelled Treatment:    Reason Eval/Treat Not Completed: Patient declined, no reason specified;Other (comment) Pt decline OT session stating he needs to sleep. Will continue to follow.   Lanier Clam., COTA/L Acute Rehabilitation Services [phone] [place] 08/19/2019, 5:18 PM

## 2019-08-19 NOTE — Progress Notes (Signed)
STROKE TEAM PROGRESS NOTE   INTERVAL HISTORY: RN at bedside. Pt awake alert, interactive today, however, per RN pt earlier today irritated and refused to work with PT/OT. SNF placement difficulty with reluctance working with PT/OT, currently needs to consider long term facility placement. However, wife wants him to go home, but wife is not able to take care of him at home.    Vitals:   08/19/19 0322 08/19/19 0728 08/19/19 1157 08/19/19 1638  BP: (!) 109/55 128/70 100/66 (!) 148/65  Pulse: 80 84 60 70  Resp: 16 18 18 18   Temp: 98.7 F (37.1 C) 97.6 F (36.4 C) 97.8 F (36.6 C) 99.1 F (37.3 C)  TempSrc: Oral Oral Oral Oral  SpO2: 93% 97% 97% 97%  Weight:      Height:       CBC:  Recent Labs  Lab 08/18/19 0426 08/19/19 0702  WBC 12.0* 8.6  HGB 9.3* 8.5*  HCT 30.5* 27.2*  MCV 98.1 95.8  PLT 452* 935   Basic Metabolic Panel:  Recent Labs  Lab 08/18/19 0426 08/19/19 0702  NA 140 136  K 4.1 4.3  CL 102 102  CO2 29 26  GLUCOSE 113* 124*  BUN 25* 32*  CREATININE 1.31* 1.38*  CALCIUM 9.0 8.6*  PHOS 3.0 2.9    IMAGING past 24 hours DG Abd 1 View  Result Date: 08/18/2019 CLINICAL DATA:  Nausea and vomiting for 1 day, history of gastric ulcer prostate cancer in hiatal hernia EXAM: ABDOMEN - 1 VIEW COMPARISON:  Numerous priors, most recently IR images 08/13/2019, abdominal radiograph 08/13/2019 FINDINGS: Redemonstration of the left upper quadrant percutaneous gastrostomy tube. Mild gaseous distention of the stomach but without high-grade obstructive bowel gas pattern. Interval transit of the previously demonstrated high attenuation contrast material to the level of the rectum. There is opacification of innumerable right and left-sided colonic diverticula. Stable appearance of metallic radiodensities projecting over the symphysis pubis. Possibly fiducials or brachytherapy implants. No acute osseous or soft tissue abnormality. Stable degenerative changes in the spine and pelvis.  IMPRESSION: 1. Mild gaseous distention of the stomach but without high-grade obstructive bowel gas pattern. 2. Interval transit of the previously demonstrated high attenuation contrast material to the level of the rectum. 3. Extensive colonic diverticulosis. Electronically Signed   By: Lovena Le M.D.   On: 08/18/2019 20:48      PHYSICAL EXAM   General- thin, well developed elderly caucasian male, not in acute distress  Ophthalmologic- fundi not visualized due to noncooperation.  Cardiovascular - Regular rhythm and rate, no M/R/G  Neuro - awake alert, oriented self, year but not to month. Follows simple commands, mild dysarthria, able to name and repeat, but psychomotor slowing, blinks to threat bilaterally, extraocular muscles intact, pupils appear equally round and reactive to light, left lower facial weakness, sensation intact in his face, tongue midline. BUE4/5. BLE4/5 proximal and distal. Can hold limbs anti-gravity without drift.  DTR 1+ and no babinski.Sensation intact subjectively, FTN not cooperative today. Gait not tested.   ASSESSMENT/PLAN [name] is a 79 y.o. male with history of Afib, systolic CHF, HLD, CAD, HTN, prostate cancer presenting with left sided weakness, facial droop, dysarthria.   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 embolic secondary to known AF even on Eliquis  Eliquis for VTE prophylaxis  Eliquis (apixaban)  daily prior to admission, now on Brilinta (ticagrelor) 90 mg bid. Continue brilinta for total 3 months and then switch to plavix. (per Dr. Estanislado Pandy).  Eliquis now on hold given bloody stool and anemia.  Therapy recommendations:  CIR - > pt w/o family support at home. 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 On metoprolol to 25 bid  . Was on eliquis since [facility] resolved and PEG was placed . Currently off eliquis d/t bloody stools and anemia  Hypertension  Home meds:  Hydralazine 50 tid, metoprolol 25 bid  On Hydralazine 25 mg Q8 and metoprolol 25 bid  BP stable  Hyperlipidemia  Home meds:  No statin  LDL 90, goal < 70  pt refuses statin  AKI on CKD  Stage III  Cre 1.68->1.65 -> 1.49->1.32->1.28->1.31->1.38  Renal US R w/ increased echogenicity. No hydro  on TF and FW  Leukocytosis  WBC 7.2->12.0->8.6  afebrile  UA neg  CXR unremarkable  Continue monitoring  Chronic HFrEF Dilated Cardiomyopathy LBBB Prolonged QT  EF 30 to 40% since 2017  This admission EF 35 to 40%  CXR 6/1 no pulmonary edema  6/2 mild SOB with frequent coughing, concerning for fluid overload with IVF, TF and FW -> lasix IV 40mg  x 1  Dysphagia . Secondary to stroke . SLP difficulty d/t pt participation. . D1 puree w/ honey thick liquids -> pt not swallowing and eating enough for daily needs -> Back to continuous feeding 5/30 . S/p PEG 08/04/19 and 08/13/19 . Speech and dietician on board . Intermittent nausea with small yellow vomitus 6/15 -> hold off TF and on IVF->resume 6/16 . Add low dose reglan to facilitate GI mobility  . KUB Extensive colonic diverticulosis  BRBPR  Episodes of N, V and diarrhea throughout admission.  Pt did not tolerate CorTrak -> PEG place 08/04/19 and re-insert 08/13/19  Jevity TF 60 ml / hour with free water 150 mls Q 4 hrs  Portable Abd 08/02/19 - Less  prominent dilatation of small bowel compared to the prior study with probable component of minimal residual ileus  Imodium 2 mg per tube Q 6 hrs for diarrhea since 07/25/19. Small blood clots in loose stool with recurrence. (No indication for colonoscopy)   Protonix 40 mg Bid per tube since 07/19/19 for GERD  Eliquis again on hold - pt still getting Brilinta  Hgb - 9.7....8.5->7.8->7.7->8.4->9.3->8.5  Will hold eliquis at d/c  Other Stroke Risk Factors  Advanced age  Coronary artery disease  Other Active Problems   BPH on flomax PTA  Prostate cancer   Hypokalemia - resolved   LBBB. Appears old. EKG neg ischemia. Trops 27->32  (Likely strain related)  Thrombocytosis - resolved  Hospital Day #41   [name], MD PhD Stroke Neurology 08/19/2019 5:43 PM    To contact Stroke Continuity provider, please refer to [email or web address] After hours, contact General Neurology

## 2019-08-19 NOTE — Progress Notes (Signed)
Physical Therapy Discharge Patient Details Name: Stephen Copeland MRN: [id number] DOB: Apr 02, 1940 Today's Date: 08/19/2019 Time:  -     Patient discharged from PT services secondary to patient has refused 3 (three) consecutive times without medical reason.  Please see latest therapy progress note for current level of functioning and progress toward goals.    Progress and discharge plan discussed with patient and/or caregiver: Patient/Caregiver agrees with plan  Patient reports he does not want to participate in therapy and does not want to go to rehab. Per PT protocol and pt's wishes PT will sign off at this time. Please re-order if needed.   Earney Navy, PTA Acute Rehabilitation Services Pager: [phone] Office: [phone]       Darliss Cheney 08/19/2019, 11:53 AM

## 2019-08-20 LAB — RENAL FUNCTION PANEL
Albumin: 2 g/dL — ABNORMAL LOW (ref 3.5–5.0)
Anion gap: 9 (ref 5–15)
BUN: 29 mg/dL — ABNORMAL HIGH (ref 8–23)
CO2: 25 mmol/L (ref 22–32)
Calcium: 8.5 mg/dL — ABNORMAL LOW (ref 8.9–10.3)
Chloride: 103 mmol/L (ref 98–111)
Creatinine, Ser: 1.44 mg/dL — ABNORMAL HIGH (ref 0.61–1.24)
GFR calc Af Amer: 54 mL/min — ABNORMAL LOW (ref >60)
GFR calc non Af Amer: 46 mL/min — ABNORMAL LOW (ref >60)
Glucose, Bld: 143 mg/dL — ABNORMAL HIGH (ref 70–99)
Phosphorus: 3.1 mg/dL (ref 2.5–4.6)
Potassium: 3.8 mmol/L (ref 3.5–5.1)
Sodium: 137 mmol/L (ref 135–145)

## 2019-08-20 LAB — CBC
HCT: 23.7 % — ABNORMAL LOW (ref 39.0–52.0)
Hemoglobin: 7.3 g/dL — ABNORMAL LOW (ref 13.0–17.0)
MCH: 29.6 pg (ref 26.0–34.0)
MCHC: 30.8 g/dL (ref 30.0–36.0)
MCV: 96 fL (ref 80.0–100.0)
Platelets: 351 10*3/uL (ref 150–400)
RBC: 2.47 MIL/uL — ABNORMAL LOW (ref 4.22–5.81)
RDW: 15.9 % — ABNORMAL HIGH (ref 11.5–15.5)
WBC: 5.8 10*3/uL (ref 4.0–10.5)
nRBC: 0 % (ref 0.0–0.2)

## 2019-08-20 LAB — GLUCOSE, CAPILLARY
Glucose-Capillary: 113 mg/dL — ABNORMAL HIGH (ref 70–99)
Glucose-Capillary: 115 mg/dL — ABNORMAL HIGH (ref 70–99)
Glucose-Capillary: 118 mg/dL — ABNORMAL HIGH (ref 70–99)
Glucose-Capillary: 119 mg/dL — ABNORMAL HIGH (ref 70–99)
Glucose-Capillary: 120 mg/dL — ABNORMAL HIGH (ref 70–99)
Glucose-Capillary: 127 mg/dL — ABNORMAL HIGH (ref 70–99)
Glucose-Capillary: 127 mg/dL — ABNORMAL HIGH (ref 70–99)

## 2019-08-20 MED ORDER — ZOLPIDEM TARTRATE 5 MG PO TABS
5.0000 mg | ORAL_TABLET | Freq: Once | ORAL | Status: AC
  Administered 2019-08-20: 5 mg via ORAL
  Filled 2019-08-20: qty 1

## 2019-08-20 NOTE — Plan of Care (Signed)
Problem: Education: Goal: Knowledge of General Education information will improve Description: Including pain rating scale, medication(s)/side effects and non-pharmacologic comfort measures Outcome: Progressing   Problem: Health Behavior/Discharge Planning: Goal: Ability to manage health-related needs will improve Outcome: Progressing   Problem: Clinical Measurements: Goal: Ability to maintain clinical measurements within normal limits will improve Outcome: Progressing Goal: Will remain free from infection Outcome: Progressing Goal: Diagnostic test results will improve Outcome: Progressing Goal: Respiratory complications will improve Outcome: Progressing Goal: Cardiovascular complication will be avoided Outcome: Progressing   Problem: Activity: Goal: Risk for activity intolerance will decrease Outcome: Progressing   Problem: Nutrition: Goal: Adequate nutrition will be maintained Outcome: Progressing   Problem: Coping: Goal: Level of anxiety will decrease Outcome: Progressing   Problem: Elimination: Goal: Will not experience complications related to bowel motility Outcome: Progressing Goal: Will not experience complications related to urinary retention Outcome: Progressing   Problem: Pain Managment: Goal: General experience of comfort will improve Outcome: Progressing   Problem: Safety: Goal: Ability to remain free from injury will improve Outcome: Progressing   Problem: Skin Integrity: Goal: Risk for impaired skin integrity will decrease Outcome: Progressing   Problem: Education: Goal: Knowledge of General Education information will improve Description: Including pain rating scale, medication(s)/side effects and non-pharmacologic comfort measures Outcome: Progressing   Problem: Health Behavior/Discharge Planning: Goal: Ability to manage health-related needs will improve Outcome: Progressing   Problem: Clinical Measurements: Goal: Ability to maintain  clinical measurements within normal limits will improve Outcome: Progressing Goal: Will remain free from infection Outcome: Progressing Goal: Diagnostic test results will improve Outcome: Progressing Goal: Respiratory complications will improve Outcome: Progressing Goal: Cardiovascular complication will be avoided Outcome: Progressing   Problem: Activity: Goal: Risk for activity intolerance will decrease Outcome: Progressing   Problem: Nutrition: Goal: Adequate nutrition will be maintained Outcome: Progressing   Problem: Coping: Goal: Level of anxiety will decrease Outcome: Progressing   Problem: Elimination: Goal: Will not experience complications related to bowel motility Outcome: Progressing Goal: Will not experience complications related to urinary retention Outcome: Progressing   Problem: Pain Managment: Goal: General experience of comfort will improve Outcome: Progressing   Problem: Safety: Goal: Ability to remain free from injury will improve Outcome: Progressing   Problem: Skin Integrity: Goal: Risk for impaired skin integrity will decrease Outcome: Progressing   Problem: Education: Goal: Knowledge of patient specific risk factors addressed and post discharge goals established will improve Outcome: Progressing Goal: Individualized Educational Video(s) Outcome: Progressing   Problem: Coping: Goal: Will verbalize positive feelings about self Outcome: Progressing Goal: Will identify appropriate support needs Outcome: Progressing   Problem: Health Behavior/Discharge Planning: Goal: Ability to manage health-related needs will improve Outcome: Progressing   Problem: Self-Care: Goal: Ability to participate in self-care as condition permits will improve Outcome: Progressing Goal: Verbalization of feelings and concerns over difficulty with self-care will improve Outcome: Progressing Goal: Ability to communicate needs accurately will improve Outcome:  Progressing   Problem: Nutrition: Goal: Risk of aspiration will decrease Outcome: Progressing Goal: Dietary intake will improve Outcome: Progressing   Problem: Ischemic Stroke/TIA Tissue Perfusion: Goal: Complications of ischemic stroke/TIA will be minimized Outcome: Progressing   Problem: Education: Goal: Knowledge of disease or condition will improve Outcome: Progressing Goal: Knowledge of secondary prevention will improve Outcome: Progressing Goal: Knowledge of patient specific risk factors addressed and post discharge goals established will improve Outcome: Progressing Goal: Individualized Educational Video(s) Outcome: Progressing   Problem: Coping: Goal: Will verbalize positive feelings about self Outcome: Progressing Goal: Will identify appropriate support needs  Outcome: Progressing   Problem: Health Behavior/Discharge Planning: Goal: Ability to manage health-related needs will improve Outcome: Progressing   Problem: Self-Care: Goal: Ability to participate in self-care as condition permits will improve Outcome: Progressing Goal: Verbalization of feelings and concerns over difficulty with self-care will improve Outcome: Progressing Goal: Ability to communicate needs accurately will improve Outcome: Progressing   Problem: Nutrition: Goal: Risk of aspiration will decrease Outcome: Progressing Goal: Dietary intake will improve Outcome: Progressing   Problem: Ischemic Stroke/TIA Tissue Perfusion: Goal: Complications of ischemic stroke/TIA will be minimized Outcome: Progressing   Problem: Education: Goal: Knowledge of disease or condition will improve Outcome: Progressing Goal: Knowledge of secondary prevention will improve Outcome: Progressing Goal: Individualized Educational Video(s) Outcome: Progressing   Problem: Intracerebral Hemorrhage Tissue Perfusion: Goal: Complications of Intracerebral Hemorrhage will be minimized Outcome: Progressing   Problem:  Ischemic Stroke/TIA Tissue Perfusion: Goal: Complications of ischemic stroke/TIA will be minimized Outcome: Progressing   Problem: Spontaneous Subarachnoid Hemorrhage Tissue Perfusion: Goal: Complications of Spontaneous Subarachnoid Hemorrhage will be minimized Outcome: Progressing

## 2019-08-20 NOTE — Progress Notes (Addendum)
STROKE TEAM PROGRESS NOTE   INTERVAL HISTORY: No family at bedside. Pt lying in bed, awake alert, pleasant, showing me the greeting card from his sister and brother-in-law. His sister and wife will come tomorrow to talk him through the rehab issue. He is now agreeing with working with PT/OT. Will re-consult them. His Hb down to 7.3 this am, no obvious bleeding source this time. Will repeat CBC.   Vitals:   08/20/19 0401 08/20/19 0509 08/20/19 0730 08/20/19 1131  BP:  (!) 146/64 (!) 128/55 136/84  Pulse:  (!) 57 (!) 59 60  Resp:   18 18  Temp:   97.8 F (36.6 C) 97.7 F (36.5 C)  TempSrc:   Oral Oral  SpO2:   97% 100%  Weight: 78.5 kg     Height:       CBC:  Recent Labs  Lab 08/19/19 0702 08/20/19 0522  WBC 8.6 5.8  HGB 8.5* 7.3*  HCT 27.2* 23.7*  MCV 95.8 96.0  PLT 397 858   Basic Metabolic Panel:  Recent Labs  Lab 08/19/19 0702 08/20/19 0522  NA 136 137  K 4.3 3.8  CL 102 103  CO2 26 25  GLUCOSE 124* 143*  BUN 32* 29*  CREATININE 1.38* 1.44*  CALCIUM 8.6* 8.5*  PHOS 2.9 3.1    IMAGING past 24 hours No results found.    PHYSICAL EXAM    General- thin, well developed elderly caucasian male, not in acute distress  Ophthalmologic- fundi not visualized due to noncooperation.  Cardiovascular - Regular rhythm and rate, no M/R/G  Neuro - awake alert, oriented self and people, year but not to month. Follows simple commands, no significant dysarthria, able to name and repeat, blinks to threat bilaterally, extraocular muscles intact, PERRL, left lower facial weakness, sensation intact in his face, tongue midline. BUE4/5. BLE4/5 proximal and distal. Can hold limbs anti-gravity without drift.  DTR 1+ and no babinski.Sensation intact subjectively, FTN grossly intact bilaterally. Gait not tested.   ASSESSMENT/PLAN [name] is a 79 y.o. male with history of Afib, systolic CHF, HLD, CAD, HTN, prostate cancer presenting with left sided weakness, facial  droop, dysarthria.   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 embolic secondary to known AF even on Eliquis  Eliquis for VTE prophylaxis  Eliquis (apixaban) daily prior to admission, now on Brilinta (ticagrelor) 90 mg bid. Continue brilinta for total 3 months and then switch to plavix. (per Dr. Estanislado Pandy).  Eliquis now on hold given bloody stool and anemia.  Therapy recommendations:  CIR - > no family support to provide care at home. 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 On metoprolol to 25 bid  . Was on eliquis since [facility] resolved and PEG was placed . Currently off eliquis d/t bloody stools and anemia  Hypertension  Home meds:  Hydralazine 50 tid, metoprolol 25 bid  On Hydralazine 25 mg Q8 and metoprolol 25 bid  BP stable  Hyperlipidemia  Home meds:  No statin  LDL 90, goal < 70  pt refuses statin  AKI on CKD  Stage III  Cre 1.68->1.65 -> 1.49->1.32->1.28->1.31->1.38->1.44  Renal US R w/ increased echogenicity. No hydro  on TF and FW  Leukocytosis  WBC 7.2->12.0->8.6->5.8  afebrile  UA neg  CXR unremarkable  Continue monitoring  Chronic HFrEF Dilated Cardiomyopathy LBBB Prolonged QT  EF 30 to 40% since 2017  This admission EF 35 to 40%  CXR 6/1 no pulmonary edema  6/2 mild SOB with frequent coughing, concerning for fluid  overload with IVF, TF and FW -> lasix IV 40mg  x 1  Dysphagia . Secondary to stroke . SLP difficulty d/t pt participation. . D1 puree w/ honey thick liquids -> pt not swallowing and eating enough for daily needs -> Back to continuous feeding 5/30 . S/p PEG 08/04/19 and 08/13/19 . Speech and dietician on board . Intermittent nausea with small yellow vomitus 6/15 -> hold off TF and on IVF->resume 6/16 . Add low dose reglan to facilitate GI mobility  . KUB Extensive colonic diverticulosis  BRBPR, resolved  Episodes of N, V and diarrhea throughout admission.  Pt did not tolerate CorTrak -> PEG place 08/04/19 and re-insert 08/13/19  Jevity TF 60 ml / hour with free water 150 mls Q 4 hrs  Portable Abd 08/02/19 - Less prominent dilatation of small bowel compared to the prior study with probable component of minimal residual ileus  Imodium 2 mg per tube Q 6 hrs for diarrhea since 07/25/19. Small blood clots in loose stool with recurrence. (No indication for colonoscopy)   Protonix 40 mg Bid per tube since 07/19/19 for GERD  Eliquis again on hold - pt still getting Brilinta  Hgb - 9.7....8.5->7.8->7.7->8.4->9.3->8.5->7.3  Will hold eliquis at d/c  Other Stroke Risk Factors  Advanced age  Coronary artery disease  Other Active Problems   BPH on flomax PTA  Prostate cancer   Hypokalemia - resolved   LBBB. Appears old. EKG neg ischemia. Trops 27->32  (Likely strain related)  Thrombocytosis - resolved  Insomnia, ambien added last night  Hospital Day #42   [name], MD PhD Stroke Neurology 08/20/2019 2:14 PM  I had long discussion with sister over the phone, updated pt current condition, treatment plan and potential prognosis, and answered all the questions. She expressed understanding and appreciation.     To contact Stroke Continuity provider, please refer to [email or web address] After hours, contact General Neurology

## 2019-08-20 NOTE — Progress Notes (Signed)
Hgb 7.3, trend down from 8.5, no active bleeding noted this shift. Dr. Cheral Marker informed.

## 2019-08-20 NOTE — Progress Notes (Signed)
Hgb down this AM to 7.3 from 8.5 yesterday. Previous lowest Hgb value this admission was 7.7 on 6/13. Has had BRBPR this admission. Eliquis was held previously. Continue to monitor. Will need repeat Hgb tomorrow AM.   Electronically signed: Dr. Kerney Elbe

## 2019-08-20 NOTE — TOC Progression Note (Signed)
Transition of Care [facility]) - Progression Note    Patient Details  Name: Stephen Copeland MRN: [id number] Date of Birth: Feb 12, 1941  Transition of Care [facility]) CM/SW [place], [place] Phone Number: 08/20/2019, 1:53 PM  Clinical Narrative:   CSW continuing to follow patient for placement. Patient no longer participating with therapy, he will not qualify for rehab placement under his Medicare. This has been explained to patient's wife, and he will need long term care. CSW spoke with patient's sister, Stephen Copeland, today to discuss the need for long term placement. Stephen Copeland asked why the patient wasn't wanting to do anything, and if he needed to see a psychiatrist, and CSW asked MD to call and answer her questions. Stephen Copeland indicated that the patient will definitely need long term placement, the family can't take care of him at home. CSW updated [place] and they have reached out to the wife to update her on costs and that they will assist her with a Medicaid application. Wife is still not ready to commit, says she needs to talk to family. CSW to follow.    Expected Discharge Plan: [place] Barriers to Discharge: Continued Medical Work up, Inadequate or no insurance  Expected Discharge Plan and Services Expected Discharge Plan: [place] arrangements for the past 2 months: Single Family Home                                       Social Determinants of Health (SDOH) Interventions    Readmission Risk Interventions No flowsheet data found.

## 2019-08-21 LAB — RENAL FUNCTION PANEL
Albumin: 2.3 g/dL — ABNORMAL LOW (ref 3.5–5.0)
Anion gap: 10 (ref 5–15)
BUN: 26 mg/dL — ABNORMAL HIGH (ref 8–23)
CO2: 27 mmol/L (ref 22–32)
Calcium: 8.8 mg/dL — ABNORMAL LOW (ref 8.9–10.3)
Chloride: 101 mmol/L (ref 98–111)
Creatinine, Ser: 1.34 mg/dL — ABNORMAL HIGH (ref 0.61–1.24)
GFR calc Af Amer: 58 mL/min — ABNORMAL LOW (ref >60)
GFR calc non Af Amer: 50 mL/min — ABNORMAL LOW (ref >60)
Glucose, Bld: 123 mg/dL — ABNORMAL HIGH (ref 70–99)
Phosphorus: 2.8 mg/dL (ref 2.5–4.6)
Potassium: 4.1 mmol/L (ref 3.5–5.1)
Sodium: 138 mmol/L (ref 135–145)

## 2019-08-21 LAB — CBC
HCT: 27 % — ABNORMAL LOW (ref 39.0–52.0)
Hemoglobin: 8.3 g/dL — ABNORMAL LOW (ref 13.0–17.0)
MCH: 29.4 pg (ref 26.0–34.0)
MCHC: 30.7 g/dL (ref 30.0–36.0)
MCV: 95.7 fL (ref 80.0–100.0)
Platelets: 404 10*3/uL — ABNORMAL HIGH (ref 150–400)
RBC: 2.82 MIL/uL — ABNORMAL LOW (ref 4.22–5.81)
RDW: 15.9 % — ABNORMAL HIGH (ref 11.5–15.5)
WBC: 9 10*3/uL (ref 4.0–10.5)
nRBC: 0 % (ref 0.0–0.2)

## 2019-08-21 LAB — GLUCOSE, CAPILLARY
Glucose-Capillary: 121 mg/dL — ABNORMAL HIGH (ref 70–99)
Glucose-Capillary: 109 mg/dL — ABNORMAL HIGH (ref 70–99)
Glucose-Capillary: 133 mg/dL — ABNORMAL HIGH (ref 70–99)
Glucose-Capillary: 102 mg/dL — ABNORMAL HIGH (ref 70–99)

## 2019-08-21 NOTE — TOC Progression Note (Signed)
Transition of Care [facility]) - Progression Note    Patient Details  Name: Jonavan Vanhorn MRN: [id number] Date of Birth: May 15, 1940  Transition of Care [facility]) CM/SW [place], [place] Phone Number: 08/21/2019, 4:18 PM  Clinical Narrative:   CSW noting per chart review that patient participated in therapies today. CSW notified [place], asked if they would reconsider rehab placement. Administrator still concerned that patient will continue to refuse, wants to see if he continues to participate over the weekend and reevaluate on Monday. CSW to follow.    Expected Discharge Plan: [place] Barriers to Discharge: Continued Medical Work up, Inadequate or no insurance  Expected Discharge Plan and Services Expected Discharge Plan: [place] arrangements for the past 2 months: Single Family Home                                       Social Determinants of Health (SDOH) Interventions    Readmission Risk Interventions No flowsheet data found.

## 2019-08-21 NOTE — Progress Notes (Signed)
Patient is refusing VS and CBG measurement per [place], NT.  Notified provider, Dr. Lavera Guise, MD.

## 2019-08-21 NOTE — Evaluation (Addendum)
Physical Therapy Evaluation Patient Details Name: Stephen Copeland MRN: [id number] DOB: Jul 04, 1940 Today's Date: 08/21/2019   History of Present Illness  79 y.o. male admitted on 07/09/19 for L sided weakness, facial droop, and slurred speech.  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  He did not get any tPA as he was outside of the window.  Pt intubated 5/6 for procedure and extubated in the AM of 07/10/19.  Pt with other significant PMH of sinus brady, scoliosis, prostate CA s/p surgery, HTN, dilated cardiomyopathy, CAD, CHF.  Patient re-intubated 5/10 with inability to clear secretions.. Extubated 5/11. PEG placed 6/1.  Clinical Impression  PT reordered as pt now agreeable to participate with therapies (increased encouragement still required). Focus of session was OOB to chair. Stedy utilized as pt unable to achieve full stand, even with +2 total assist and RW for support. Pt with several episodes where he would cough, get very panicked and begin gasping, yelling out for his oxygen. Pt would then put the nasal cannula in his mouth, take a couple breaths and toss the nasal cannula to the side despite education on pursed lip breathing and proper way to don [place]. Attempted to put the nasal cannula on correctly however pt refusing until we were EOB and ready to transfer to the chair. Recommendations from prior sessions remain appropriate, and we will plan on SNF level rehab at d/c. Pt currently with functional limitations due to the deficits listed below (see PT Problem List). Pt will benefit from skilled PT to increase their independence and safety with mobility to allow discharge to the venue listed below.       Follow Up Recommendations SNF;Supervision/Assistance - 24 hour    Equipment Recommendations  Other (comment) (TBD at next venue of care)    Recommendations for Other Services       Precautions / Restrictions Precautions Precautions: Fall Precaution Comments:  PEG Restrictions Weight Bearing Restrictions: No      Mobility  Bed Mobility Overal bed mobility: Needs Assistance Bed Mobility: Rolling;Sidelying to Sit Rolling: Min assist (to achieve full roll) Sidelying to sit: Max assist;+2 for physical assistance       General bed mobility comments: VC's for sequencing and +2 max assist to elevate trunk to full sitting position.   Transfers Overall transfer level: Needs assistance Equipment used: Rolling walker (2 wheeled) Transfers: Sit to/from Stand Sit to Stand: Total assist;Max assist;+2 physical assistance;From elevated surface         General transfer comment: Initially attempted with the RW, and pt required total assist +2 to clear hips from bed. +2 max assist to pull to stand with Stedy.   Ambulation/Gait             General Gait Details: Unable to attempt at this time.   Stairs            Wheelchair Mobility    Modified Rankin (Stroke Patients Only) Modified Rankin (Stroke Patients Only) Pre-Morbid Rankin Score: No symptoms Modified Rankin: Severe disability     Balance Overall balance assessment: Needs assistance Sitting-balance support: Feet supported;Bilateral upper extremity supported Sitting balance-Leahy Scale: Poor Sitting balance - Comments: min guard to min assist for EOB balance, B hands on RW    Standing balance support: Bilateral upper extremity supported;During functional activity Standing balance-Leahy Scale: Poor Standing balance comment: relies on external and BUE support  Pertinent Vitals/Pain Pain Assessment: Faces Faces Pain Scale: No hurt Pain Intervention(s): Monitored during session    Home Living Family/patient expects to be discharged to:: Private residence Living Arrangements: Spouse/significant other Available Help at Discharge: Family Type of Home: House Home Access: Level entry     Home Layout: One level        Prior  Function Level of Independence: Independent               Hand Dominance   Dominant Hand: Right    Extremity/Trunk Assessment   Upper Extremity Assessment Upper Extremity Assessment: Generalized weakness    Lower Extremity Assessment Lower Extremity Assessment: Generalized weakness RLE Deficits / Details: right leg is generally weak, but he can rais against gravity, left leg is mildly weaker and he can raise it against partial gravity.   LLE Deficits / Details: right leg is generally weak, but he can rais against gravity, left leg is mildly weaker and he can raise it against partial gravity.      Cervical / Trunk Assessment Cervical / Trunk Assessment: Other exceptions Cervical / Trunk Exceptions: chart lists h/o scoliosis  Communication   Communication: Expressive difficulties (slurred)  Cognition Arousal/Alertness: Awake/alert Behavior During Therapy: Anxious Overall Cognitive Status: Impaired/Different from baseline Area of Impairment: Attention;Memory;Following commands;Safety/judgement;Problem solving;Awareness                   Current Attention Level: Sustained Memory: Decreased short-term memory Following Commands: Follows one step commands inconsistently Safety/Judgement: Decreased awareness of safety;Decreased awareness of deficits Awareness: Intellectual Problem Solving: Decreased initiation;Requires verbal cues;Requires tactile cues;Slow processing;Difficulty sequencing        General Comments      Exercises     Assessment/Plan    PT Assessment Patient needs continued PT services  PT Problem List Decreased strength;Decreased activity tolerance;Decreased balance;Decreased mobility;Decreased coordination;Decreased cognition;Decreased knowledge of use of DME;Decreased knowledge of precautions;Decreased safety awareness;Decreased skin integrity       PT Treatment Interventions DME instruction;Gait training;Stair training;Functional mobility  training;Therapeutic activities;Therapeutic exercise;Balance training;Neuromuscular re-education;Cognitive remediation;Patient/family education;Wheelchair mobility training    PT Goals (Current goals can be found in the Care Plan section)  Acute Rehab PT Goals Patient Stated Goal: less pain  PT Goal Formulation: With patient Time For Goal Achievement: 09/04/19 Potential to Achieve Goals: Fair    Frequency Min 2X/week   Barriers to discharge        Co-evaluation PT/OT/SLP Co-Evaluation/Treatment: Yes Reason for Co-Treatment: Complexity of the patient's impairments (multi-system involvement);Necessary to address cognition/behavior during functional activity;For patient/therapist safety;To address functional/ADL transfers PT goals addressed during session: Mobility/safety with mobility;Balance;Proper use of DME         AM-PAC PT "6 Clicks" Mobility  Outcome Measure Help needed turning from your back to your side while in a flat bed without using bedrails?: A Little Help needed moving from lying on your back to sitting on the side of a flat bed without using bedrails?: A Lot Help needed moving to and from a bed to a chair (including a wheelchair)?: Total Help needed standing up from a chair using your arms (e.g., wheelchair or bedside chair)?: Total Help needed to walk in hospital room?: Total Help needed climbing 3-5 steps with a railing? : Total 6 Click Score: 9    End of Session Equipment Utilized During Treatment: Gait belt Activity Tolerance: Patient limited by fatigue (weakness) Patient left: in chair;with call bell/phone within reach;with chair alarm set;with family/visitor present Nurse Communication: Mobility status PT Visit Diagnosis: Muscle weakness (  generalized) (M62.81);Other symptoms and signs involving the nervous system (R29.898);Hemiplegia and hemiparesis Hemiplegia - Right/Left: Left Hemiplegia - dominant/non-dominant: Non-dominant Hemiplegia - caused by:  Cerebral infarction    Time: 1200-1231 PT Time Calculation (min) (ACUTE ONLY): 31 min   Charges:   PT Evaluation $PT Eval High Complexity: 1 High          Rolinda Roan, PT, DPT Acute Rehabilitation Services Pager: [phone] Office: [phone]   Thelma Comp 08/21/2019, 1:36 PM

## 2019-08-21 NOTE — Progress Notes (Signed)
Pt is refusing vital signs and CBG checked.  Notified Dr. Lavera Guise, MD.  Provider made aware.

## 2019-08-21 NOTE — Progress Notes (Signed)
STROKE TEAM PROGRESS NOTE   INTERVAL HISTORY: Sister and brother in law are at the bedside. Pt again asking to transfer to [facility] by ambulance. When asked why he wanted to be transferred there, he said that he had been there for his prostate cancer treatment and he needs follow up there. Told him that he can follow up there as outpt once discharged. His sister has persuaded him to cooperate with PT/OT. He later participated PT/OT session.   Vitals:   08/21/19 0331 08/21/19 0402 08/21/19 0531 08/21/19 0734  BP: (!) 153/79  (!) 146/96 (!) 141/69  Pulse: 78  86 86  Resp: 16   18  Temp: 97.6 F (36.4 C)   98.7 F (37.1 C)  TempSrc: Oral   Oral  SpO2: 96%  97% 95%  Weight:  77.6 kg    Height:       CBC:  Recent Labs  Lab 08/20/19 0522 08/21/19 0418  WBC 5.8 9.0  HGB 7.3* 8.3*  HCT 23.7* 27.0*  MCV 96.0 95.7  PLT 351 387*   Basic Metabolic Panel:  Recent Labs  Lab 08/20/19 0522 08/21/19 0418  NA 137 138  K 3.8 4.1  CL 103 101  CO2 25 27  GLUCOSE 143* 123*  BUN 29* 26*  CREATININE 1.44* 1.34*  CALCIUM 8.5* 8.8*  PHOS 3.1 2.8    IMAGING past 24 hours No results found.    PHYSICAL EXAM    General- thin, well developed elderly caucasian male, not in acute distress  Ophthalmologic- fundi not visualized due to noncooperation.  Cardiovascular - Regular rhythm and rate, no M/R/G  Neuro - awake alert, oriented self and people, year but not to month. Follows simple commands, no significant dysarthria, able to name and repeat, blinks to threat bilaterally, extraocular muscles intact, PERRL, left lower facial weakness, sensation intact in his face, tongue midline. BUE4/5. BLE4/5 proximal and distal. Can hold limbs anti-gravity without drift.  DTR 1+ and no babinski.Sensation intact subjectively, FTN grossly intact bilaterally. Gait not tested.   ASSESSMENT/PLAN Stephen Copeland is a 79 y.o. male with history of Afib, systolic CHF, HLD, CAD, HTN, prostate cancer  presenting with left sided weakness, facial droop, dysarthria.   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 embolic secondary to known AF even on Eliquis  Eliquis for VTE prophylaxis  Eliquis (apixaban) daily prior to admission, now on Brilinta (ticagrelor) 90 mg bid. Continue brilinta for total 3 months and then switch to plavix. (per Dr. Estanislado Pandy).  Eliquis now on hold given bloody stool and anemia.  Therapy recommendations:  CIR - > no family support to provide care at home. 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 On metoprolol to 25 bid  . Was on eliquis since [facility] resolved and PEG was placed . Currently off eliquis d/t bloody stools and anemia  Hypertension  Home meds:  Hydralazine 50 tid, metoprolol 25 bid  On Hydralazine 25 mg Q8 and metoprolol 25 bid  BP stable  Hyperlipidemia  Home meds:  No statin  LDL 90, goal < 70  pt refuses statin  AKI on CKD  Stage III  Cre 1.68->1.65 -> 1.49->1.32->1.28->1.31->1.38->1.44->1.34  Renal US R w/ increased echogenicity. No hydro  on TF and FW  Leukocytosis  WBC 7.2->12.0->8.6->5.8->9.0 (temp 98.7)  Afebrile  UA neg  CXR unremarkable  Continue monitoring  Chronic HFrEF Dilated Cardiomyopathy LBBB Prolonged QT  EF 30 to 40% since 2017  This admission EF 35 to 40%  CXR 6/1 no pulmonary edema  6/2 mild SOB with  frequent coughing, concerning for fluid overload with IVF, TF and FW -> lasix IV 40mg  x 1  Dysphagia . Secondary to stroke . SLP difficulty d/t pt participation. . D1 puree w/ honey thick liquids -> pt not swallowing and eating enough for daily needs -> Back to continuous feeding 5/30 . S/p PEG 08/04/19 and 08/13/19 . Speech and dietician on board . Intermittent nausea with small yellow vomitus 6/15 -> hold off TF and on IVF->resume 6/16 . Add low dose reglan to facilitate GI mobility  . KUB Extensive colonic diverticulosis  BRBPR, resolved  Episodes of N, V and diarrhea throughout admission.  Pt did not tolerate CorTrak -> PEG place 08/04/19 and re-insert 08/13/19  Jevity TF 60 ml / hour with free water 150 mls Q 4 hrs  Portable Abd 08/02/19 - Less prominent dilatation of small bowel compared to the prior study with probable component of minimal residual ileus  Imodium 2 mg per tube Q 6 hrs for diarrhea since 07/25/19. Small blood clots in loose stool with recurrence. (No indication for colonoscopy)   Protonix 40 mg Bid per tube since 07/19/19 for GERD  Eliquis again on hold - pt still getting Brilinta  Hgb - 9.7....8.5->7.8->7.7->8.4->9.3->8.5->7.3->8.3  Will hold eliquis at d/c  Other Stroke Risk Factors  Advanced age  Coronary artery disease  Other Active Problems   BPH on flomax PTA  Prostate cancer   Hypokalemia - resolved   LBBB. Appears old. EKG neg ischemia. Trops 27->32  (Likely strain related)  Thrombocytosis - resolved  Insomnia, ambien added last night  Hospital Day #43   [name], MD PhD Stroke Neurology 08/22/2019 1:20 AM    To contact Stroke Continuity provider, please refer to [email or web address] After hours, contact General Neurology

## 2019-08-21 NOTE — Progress Notes (Signed)
Occupational Therapy Treatment Patient Details Name: Stephen Copeland MRN: [id number] DOB: 1940-03-14 Today's Date: 08/21/2019    History of present illness 79 y.o. male admitted on 07/09/19 for L sided weakness, facial droop, and slurred speech.  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  He did not get any tPA as he was outside of the window.  Pt intubated 5/6 for procedure and extubated in the AM of 07/10/19.  Pt with other significant PMH of sinus brady, scoliosis, prostate CA s/p surgery, HTN, dilated cardiomyopathy, CAD, CHF.  Patient re-intubated 5/10 with inability to clear secretions.. Extubated 5/11. PEG placed 6/1.   OT comments  Pt seen in conjunction with PT to optimize participation and tolerance in an effort to progress pt towards functional mobility goals. Pt continues to be hesitant to participate in therapy, but with MAX encouragement from family and MD pt agreeable to session. Overall, pt requires MAX A +2 for bed mobility and MAX - total A +2 for sit<>stand. Attempted sit<>stand with RW with pt unable to progress to full stand. Utilized stedy with pt able to come into full stand with MAX A +2. Pt currently requires total A for ADLs from bed level. Pt continues to present with cognitive deficits in the areas listed below. Continue to recommend SNF for DC. Collaborated with OTR concerning goals as pt has declined therapy past 2 sessions and remains at same level of function since last session with OTR . Goals currently remain appropriate, but will have OTR to reassess goals next session as needed. will continue to follow acutely per POC.   Follow Up Recommendations  SNF;Supervision/Assistance - 24 hour    Equipment Recommendations  Other (comment) (Defer to next venue)    Recommendations for Other Services      Precautions / Restrictions Precautions Precautions: Fall Precaution Comments: PEG Restrictions Weight Bearing Restrictions: No        Mobility Bed Mobility Overal bed mobility: Needs Assistance Bed Mobility: Rolling;Sidelying to Sit Rolling: Min assist (to achieve complete roll) Sidelying to sit: Max assist;+2 for physical assistance       General bed mobility comments: VC's for sequencing and +2 max assist to elevate trunk to full sitting position.   Transfers Overall transfer level: Needs assistance Equipment used: Rolling walker (2 wheeled) Transfers: Sit to/from Stand Sit to Stand: Total assist;Max assist;+2 physical assistance;From elevated surface         General transfer comment: Initially attempted with the RW, and pt required total assist +2 to clear hips from bed. +2 max assist to pull to stand with Stedy.     Balance Overall balance assessment: Needs assistance Sitting-balance support: Feet supported;Bilateral upper extremity supported Sitting balance-Leahy Scale: Poor Sitting balance - Comments: min guard to min assist for EOB balance, B hands on RW    Standing balance support: Bilateral upper extremity supported;During functional activity Standing balance-Leahy Scale: Poor Standing balance comment: relies on external and BUE support                            ADL either performed or assessed with clinical judgement   ADL Overall ADL's : Needs assistance/impaired                         Toilet Transfer: +2 for physical assistance;Total assistance Toilet Transfer Details (indicate cue type and reason): stedy  Functional mobility during ADLs: Maximal assistance;+2 for physical assistance;+2 for safety/equipment (sit<>stand to stedy only) General ADL Comments: pt agreeable to session with max encouragment from family and MD. Pt requires MIN A to roll R<>L for pericare, MAX A +2 for bed mobility and MAX A+2 for sit<>stand to stedy. pt continues to present with decreased activity tolerance, generalized weakness, impaired cognition and decreased ability to care for  self impacting pts ability to engage in [place]: Awake/alert Behavior During Therapy: Anxious Overall Cognitive Status: Impaired/Different from baseline Area of Impairment: Attention;Memory;Following commands;Safety/judgement;Problem solving;Awareness                   Current Attention Level: Sustained Memory: Decreased short-term memory Following Commands: Follows one step commands inconsistently Safety/Judgement: Decreased awareness of safety;Decreased awareness of deficits Awareness: Intellectual Problem Solving: Decreased initiation;Requires verbal cues;Requires tactile cues;Slow processing;Difficulty sequencing General Comments: pt with poor awareness of situation, requires simple 1 step commands with increased time to engage in ADLs and mobility        Exercises     Shoulder Instructions       General Comments pts sister Bethena Roys present during session provided Max encouragement    Pertinent Vitals/ Pain       Pain Assessment: Faces Faces Pain Scale: No hurt Pain Intervention(s): Monitored during session  Home Living Family/patient expects to be discharged to:: Private residence Living Arrangements: Spouse/significant other Available Help at Discharge: Family Type of Home: House Home Access: Level entry     Home Layout: One level     Bathroom Shower/Tub: Tub only (has chair in tub)   Bathroom Toilet: Standard Bathroom Accessibility: No          Lives With: Spouse    Prior Functioning/Environment Level of Independence: Independent            Frequency  Min 2X/week        Progress Toward Goals  OT Goals(current goals can now be found in the care plan section)  Progress towards OT goals: Progressing toward goals  Acute Rehab OT Goals Patient Stated Goal: less pain  OT Goal Formulation: With patient Time For Goal Achievement: 08/19/19 Potential to Achieve  Goals: [place] Discharge plan remains appropriate;Frequency remains appropriate    Co-evaluation    PT/OT/SLP Co-Evaluation/Treatment: Yes Reason for Co-Treatment: Complexity of the patient's impairments (multi-system involvement);For patient/therapist safety;To address functional/ADL transfers;Necessary to address cognition/behavior during functional activity PT goals addressed during session: Mobility/safety with mobility;Balance;Proper use of DME OT goals addressed during session: ADL's and self-care      AM-PAC OT "6 Clicks" Daily Activity     Outcome Measure   Help from another person eating meals?: A Lot Help from another person taking care of personal grooming?: A Lot Help from another person toileting, which includes using toliet, bedpan, or urinal?: Total Help from another person bathing (including washing, rinsing, drying)?: A Lot Help from another person to put on and taking off regular upper body clothing?: Total Help from another person to put on and taking off regular lower body clothing?: Total 6 Click Score: 9    End of Session Equipment Utilized During Treatment: Oxygen;Other (comment) (1L intermittently)  OT Visit Diagnosis: Unsteadiness on feet (R26.81);Other abnormalities of gait and mobility (R26.89);Muscle weakness (generalized) (M62.81);Pain;Other symptoms and signs involving cognitive function   Activity Tolerance Treatment limited secondary to agitation  Patient Left in chair;with call bell/phone within reach;with chair alarm set;with family/visitor present   Nurse Communication Mobility status        Time: 7737-5051 OT Time Calculation (min): 34 min  Charges: OT General Charges $OT Visit: 1 Visit OT Treatments $Therapeutic Activity: 8-22 mins Lanier Clam., COTA/L Acute Rehabilitation Services [phone]    Ihor Gully 08/21/2019, 2:37 PM

## 2019-08-21 NOTE — Plan of Care (Signed)
Problem: Education: Goal: Knowledge of General Education information will improve Description: Including pain rating scale, medication(s)/side effects and non-pharmacologic comfort measures Outcome: Progressing   Problem: Health Behavior/Discharge Planning: Goal: Ability to manage health-related needs will improve Outcome: Progressing   Problem: Clinical Measurements: Goal: Ability to maintain clinical measurements within normal limits will improve Outcome: Progressing Goal: Will remain free from infection Outcome: Progressing Goal: Diagnostic test results will improve Outcome: Progressing Goal: Respiratory complications will improve Outcome: Progressing Goal: Cardiovascular complication will be avoided Outcome: Progressing   Problem: Activity: Goal: Risk for activity intolerance will decrease Outcome: Progressing   Problem: Nutrition: Goal: Adequate nutrition will be maintained Outcome: Progressing   Problem: Coping: Goal: Level of anxiety will decrease Outcome: Progressing   Problem: Elimination: Goal: Will not experience complications related to bowel motility Outcome: Progressing Goal: Will not experience complications related to urinary retention Outcome: Progressing   Problem: Pain Managment: Goal: General experience of comfort will improve Outcome: Progressing   Problem: Safety: Goal: Ability to remain free from injury will improve Outcome: Progressing   Problem: Skin Integrity: Goal: Risk for impaired skin integrity will decrease Outcome: Progressing   Problem: Education: Goal: Knowledge of General Education information will improve Description: Including pain rating scale, medication(s)/side effects and non-pharmacologic comfort measures Outcome: Progressing   Problem: Health Behavior/Discharge Planning: Goal: Ability to manage health-related needs will improve Outcome: Progressing   Problem: Clinical Measurements: Goal: Ability to maintain  clinical measurements within normal limits will improve Outcome: Progressing Goal: Will remain free from infection Outcome: Progressing Goal: Diagnostic test results will improve Outcome: Progressing Goal: Respiratory complications will improve Outcome: Progressing Goal: Cardiovascular complication will be avoided Outcome: Progressing   Problem: Activity: Goal: Risk for activity intolerance will decrease Outcome: Progressing   Problem: Nutrition: Goal: Adequate nutrition will be maintained Outcome: Progressing   Problem: Elimination: Goal: Will not experience complications related to bowel motility Outcome: Progressing Goal: Will not experience complications related to urinary retention Outcome: Progressing   Problem: Pain Managment: Goal: General experience of comfort will improve Outcome: Progressing   Problem: Safety: Goal: Ability to remain free from injury will improve Outcome: Progressing   Problem: Skin Integrity: Goal: Risk for impaired skin integrity will decrease Outcome: Progressing   Problem: Education: Goal: Knowledge of patient specific risk factors addressed and post discharge goals established will improve Outcome: Progressing Goal: Individualized Educational Video(s) Outcome: Progressing   Problem: Coping: Goal: Will verbalize positive feelings about self Outcome: Progressing Goal: Will identify appropriate support needs Outcome: Progressing   Problem: Health Behavior/Discharge Planning: Goal: Ability to manage health-related needs will improve Outcome: Progressing   Problem: Self-Care: Goal: Ability to participate in self-care as condition permits will improve Outcome: Progressing Goal: Verbalization of feelings and concerns over difficulty with self-care will improve Outcome: Progressing Goal: Ability to communicate needs accurately will improve Outcome: Progressing   Problem: Nutrition: Goal: Risk of aspiration will decrease Outcome:  Progressing Goal: Dietary intake will improve Outcome: Progressing   Problem: Ischemic Stroke/TIA Tissue Perfusion: Goal: Complications of ischemic stroke/TIA will be minimized Outcome: Progressing   Problem: Education: Goal: Knowledge of disease or condition will improve Outcome: Progressing Goal: Knowledge of secondary prevention will improve Outcome: Progressing Goal: Knowledge of patient specific risk factors addressed and post discharge goals established will improve Outcome: Progressing Goal: Individualized Educational Video(s) Outcome: Progressing   Problem: Coping: Goal: Will verbalize positive feelings about self Outcome: Progressing Goal: Will identify appropriate support needs Outcome: Progressing   Problem: Health Behavior/Discharge Planning: Goal: Ability to manage  health-related needs will improve Outcome: Progressing   Problem: Self-Care: Goal: Ability to participate in self-care as condition permits will improve Outcome: Progressing Goal: Verbalization of feelings and concerns over difficulty with self-care will improve Outcome: Progressing Goal: Ability to communicate needs accurately will improve Outcome: Progressing   Problem: Nutrition: Goal: Risk of aspiration will decrease Outcome: Progressing Goal: Dietary intake will improve Outcome: Progressing   Problem: Ischemic Stroke/TIA Tissue Perfusion: Goal: Complications of ischemic stroke/TIA will be minimized Outcome: Progressing   Problem: Education: Goal: Knowledge of disease or condition will improve Outcome: Progressing Goal: Knowledge of secondary prevention will improve Outcome: Progressing Goal: Individualized Educational Video(s) Outcome: Progressing   Problem: Ischemic Stroke/TIA Tissue Perfusion: Goal: Complications of ischemic stroke/TIA will be minimized Outcome: Progressing   Problem: Spontaneous Subarachnoid Hemorrhage Tissue Perfusion: Goal: Complications of Spontaneous  Subarachnoid Hemorrhage will be minimized Outcome: Progressing

## 2019-08-22 LAB — CBC
HCT: 27.4 % — ABNORMAL LOW (ref 39.0–52.0)
Hemoglobin: 8.6 g/dL — ABNORMAL LOW (ref 13.0–17.0)
MCH: 30.1 pg (ref 26.0–34.0)
MCHC: 31.4 g/dL (ref 30.0–36.0)
MCV: 95.8 fL (ref 80.0–100.0)
Platelets: 411 10*3/uL — ABNORMAL HIGH (ref 150–400)
RBC: 2.86 MIL/uL — ABNORMAL LOW (ref 4.22–5.81)
RDW: 16 % — ABNORMAL HIGH (ref 11.5–15.5)
WBC: 7.7 10*3/uL (ref 4.0–10.5)
nRBC: 0 % (ref 0.0–0.2)

## 2019-08-22 LAB — RENAL FUNCTION PANEL
Albumin: 2.3 g/dL — ABNORMAL LOW (ref 3.5–5.0)
Anion gap: 9 (ref 5–15)
BUN: 23 mg/dL (ref 8–23)
CO2: 27 mmol/L (ref 22–32)
Calcium: 8.9 mg/dL (ref 8.9–10.3)
Chloride: 102 mmol/L (ref 98–111)
Creatinine, Ser: 1.33 mg/dL — ABNORMAL HIGH (ref 0.61–1.24)
GFR calc Af Amer: 59 mL/min — ABNORMAL LOW (ref >60)
GFR calc non Af Amer: 51 mL/min — ABNORMAL LOW (ref >60)
Glucose, Bld: 122 mg/dL — ABNORMAL HIGH (ref 70–99)
Phosphorus: 2.7 mg/dL (ref 2.5–4.6)
Potassium: 3.9 mmol/L (ref 3.5–5.1)
Sodium: 138 mmol/L (ref 135–145)

## 2019-08-22 LAB — GLUCOSE, CAPILLARY
Glucose-Capillary: 108 mg/dL — ABNORMAL HIGH (ref 70–99)
Glucose-Capillary: 108 mg/dL — ABNORMAL HIGH (ref 70–99)
Glucose-Capillary: 123 mg/dL — ABNORMAL HIGH (ref 70–99)
Glucose-Capillary: 126 mg/dL — ABNORMAL HIGH (ref 70–99)
Glucose-Capillary: 129 mg/dL — ABNORMAL HIGH (ref 70–99)
Glucose-Capillary: 72 mg/dL (ref 70–99)

## 2019-08-22 LAB — MAGNESIUM: Magnesium: 1.8 mg/dL (ref 1.7–2.4)

## 2019-08-22 MED ORDER — LORAZEPAM 2 MG/ML IJ SOLN: 0.5000 mg | Freq: Once | INTRAMUSCULAR | Status: DC | PRN

## 2019-08-22 MED ORDER — LORAZEPAM 2 MG/ML IJ SOLN
1.0000 mg | Freq: Once | INTRAMUSCULAR | Status: AC
  Administered 2019-08-22: 1 mg via INTRAVENOUS
  Filled 2019-08-22: qty 1

## 2019-08-22 MED ORDER — LORAZEPAM BOLUS VIA INFUSION: 0.5000 mg | Freq: Once | INTRAVENOUS | Status: DC | PRN

## 2019-08-22 MED ORDER — LORAZEPAM BOLUS VIA INFUSION: 1.0000 mg | Freq: Once | INTRAVENOUS | Status: DC

## 2019-08-22 MED ORDER — LORAZEPAM 2 MG/ML IJ SOLN: 1.0000 mg | Freq: Four times a day (QID) | INTRAMUSCULAR | Status: DC | PRN

## 2019-08-22 MED ORDER — LORAZEPAM BOLUS VIA INFUSION
0.5000 mg | Freq: Once | INTRAVENOUS | Status: DC
  Filled 2019-08-22: qty 1

## 2019-08-22 MED ORDER — LORAZEPAM 2 MG/ML IJ SOLN
0.5000 mg | Freq: Once | INTRAMUSCULAR | Status: AC
  Administered 2019-08-22: 0.5 mg via INTRAVENOUS
  Filled 2019-08-22: qty 1

## 2019-08-22 MED ORDER — LORAZEPAM BOLUS VIA INFUSION: 1.0000 mg | Freq: Four times a day (QID) | INTRAVENOUS | Status: DC | PRN

## 2019-08-22 NOTE — Progress Notes (Addendum)
STROKE TEAM PROGRESS NOTE   INTERVAL HISTORY: We received multiple calls from the nurse regarding this patient.  He has been quite agitated.  He has got a few doses of Ativan but this is helped only modestly.  There have been concerns about giving neuroleptic because of prolonged QT interval.  Vitals:   08/22/19 0739 08/22/19 1115 08/22/19 1118 08/22/19 1639  BP: (!) 155/79 (!) 187/170  (!) 142/88  Pulse: 67 75  79  Resp: 18 20  20   Temp: 98.4 F (36.9 C) 98.8 F (37.1 C)  (!) 97.5 F (36.4 C)  TempSrc: Oral Oral  Oral  SpO2: 97% (!) 87% 97% 96%  Weight:      Height:       CBC:  Recent Labs  Lab 08/21/19 0418 08/22/19 0421  WBC 9.0 7.7  HGB 8.3* 8.6*  HCT 27.0* 27.4*  MCV 95.7 95.8  PLT 404* 580*   Basic Metabolic Panel:  Recent Labs  Lab 08/21/19 0418 08/22/19 0421  NA 138 138  K 4.1 3.9  CL 101 102  CO2 27 27  GLUCOSE 123* 122*  BUN 26* 23  CREATININE 1.34* 1.33*  CALCIUM 8.8* 8.9  MG  --  1.8  PHOS 2.8 2.7    IMAGING past 24 hours No results found.    PHYSICAL EXAM    General- thin, well developed elderly caucasian male, not in acute distress  Ophthalmologic- fundi not visualized due to noncooperation.  Cardiovascular - Regular rhythm and rate, no M/R/G  Neuro -The patient is quite restless.  He does follow simple commands. PERRL;  He moves all 4 extremities well.  No abnormal movements are noted such as myoclonus or tremors.   ASSESSMENT/PLAN Mr. Alvar Malinoski is a 79 y.o. male with history of Afib, systolic CHF, HLD, CAD, HTN, prostate cancer presenting with left sided weakness, facial droop, dysarthria.   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 embolic secondary to known AF even on Eliquis  Eliquis for VTE prophylaxis  Eliquis (apixaban) daily prior to admission, now on Brilinta (ticagrelor)  90 mg bid. Continue brilinta for total 3 months and then switch to plavix. (per Dr. Estanislado Pandy).  Eliquis now on hold given bloody stool and anemia.  Therapy recommendations:  CIR - > no family support to provide care at home. 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 On metoprolol to 25 bid  . Was on eliquis since [facility] resolved and PEG was placed . Currently off eliquis d/t bloody stools and anemia  Hypertension  Home meds:  Hydralazine 50 tid, metoprolol 25 bid  On Hydralazine 25 mg Q8 and metoprolol 25 bid  BP stable  Hyperlipidemia  Home meds:  No statin  LDL 90, goal < 70  pt refuses statin  AKI on CKD  Stage III  Cre 1.68->1.65 -> 1.49->1.32->1.28->1.31->1.38->1.44->1.34  Renal  US R w/ increased echogenicity. No hydro  on TF and FW  Leukocytosis  WBC 7.2->12.0->8.6->5.8->9.0 (temp 98.7)  Afebrile  UA neg  CXR unremarkable  Continue monitoring  Chronic HFrEF Dilated Cardiomyopathy LBBB Prolonged QT  EF 30 to 40% since 2017  This admission EF 35 to 40%  CXR 6/1 no pulmonary edema  6/2 mild SOB with frequent coughing, concerning for fluid overload with IVF, TF and FW -> lasix IV 40mg  x 1  Dysphagia . Secondary to stroke . SLP difficulty d/t pt participation. . D1 puree w/ honey thick liquids -> pt not swallowing and eating enough for daily needs -> Back to continuous feeding 5/30 . S/p PEG 08/04/19 and 08/13/19 . Speech and dietician on board . Intermittent nausea with small yellow vomitus 6/15 -> hold  off TF and on IVF->resume 6/16 . Add low dose reglan to facilitate GI mobility  . KUB Extensive colonic diverticulosis  BRBPR, resolved  Episodes of N, V and diarrhea throughout admission.  Pt did not tolerate CorTrak -> PEG place 08/04/19 and re-insert 08/13/19  Jevity TF 60 ml / hour with free water 150 mls Q 4 hrs  Portable Abd 08/02/19 - Less prominent dilatation of small bowel compared to the prior study with probable component of minimal residual ileus  Imodium 2 mg per tube Q 6 hrs for diarrhea since 07/25/19. Small blood clots in loose stool with recurrence. (No indication for colonoscopy)   Protonix 40 mg Bid per tube since 07/19/19 for GERD  Eliquis again on hold - pt still getting Brilinta  Hgb - 9.7....8.5->7.8->7.7->8.4->9.3->8.5->7.3->8.3  Will hold eliquis at d/c  Agitation -consistent with acute delirium.  The pt was extremely agitated today and required several doses of ativan to calm - felt at one point we made need to get a sitter  Unclear what prompted the episode  Pharmacy advised against any anti psychotics due to pt's hx of prolonged QT interval  Will order prn ativan  Labs stable; but consider repeating labs including urinalysis.  Other Stroke Risk Factors  Advanced age  Coronary artery disease  Other Active Problems   BPH on flomax PTA  Prostate cancer   Hypokalemia - resolved   LBBB. Appears old. EKG neg ischemia. Trops 27->32  (Likely strain related)  Thrombocytosis - resolved  Insomnia, ambien added last night  Hospital Day #44       To contact Stroke Continuity provider, please refer to [email or web address] After hours, contact General Neurology

## 2019-08-23 LAB — RENAL FUNCTION PANEL
Albumin: 2.5 g/dL — ABNORMAL LOW (ref 3.5–5.0)
Anion gap: 10 (ref 5–15)
BUN: 24 mg/dL — ABNORMAL HIGH (ref 8–23)
CO2: 27 mmol/L (ref 22–32)
Calcium: 9 mg/dL (ref 8.9–10.3)
Chloride: 102 mmol/L (ref 98–111)
Creatinine, Ser: 1.26 mg/dL — ABNORMAL HIGH (ref 0.61–1.24)
GFR calc Af Amer: >60 mL/min (ref >60)
GFR calc non Af Amer: 54 mL/min — ABNORMAL LOW (ref >60)
Glucose, Bld: 127 mg/dL — ABNORMAL HIGH (ref 70–99)
Phosphorus: 3.4 mg/dL (ref 2.5–4.6)
Potassium: 4 mmol/L (ref 3.5–5.1)
Sodium: 139 mmol/L (ref 135–145)

## 2019-08-23 LAB — GLUCOSE, CAPILLARY
Glucose-Capillary: 132 mg/dL — ABNORMAL HIGH (ref 70–99)
Glucose-Capillary: 108 mg/dL — ABNORMAL HIGH (ref 70–99)
Glucose-Capillary: 139 mg/dL — ABNORMAL HIGH (ref 70–99)
Glucose-Capillary: 97 mg/dL (ref 70–99)
Glucose-Capillary: 116 mg/dL — ABNORMAL HIGH (ref 70–99)

## 2019-08-23 LAB — CBC
HCT: 30 % — ABNORMAL LOW (ref 39.0–52.0)
Hemoglobin: 9.2 g/dL — ABNORMAL LOW (ref 13.0–17.0)
MCH: 29.7 pg (ref 26.0–34.0)
MCHC: 30.7 g/dL (ref 30.0–36.0)
MCV: 96.8 fL (ref 80.0–100.0)
Platelets: 450 10*3/uL — ABNORMAL HIGH (ref 150–400)
RBC: 3.1 MIL/uL — ABNORMAL LOW (ref 4.22–5.81)
RDW: 16.3 % — ABNORMAL HIGH (ref 11.5–15.5)
WBC: 9.5 10*3/uL (ref 4.0–10.5)
nRBC: 0 % (ref 0.0–0.2)

## 2019-08-23 LAB — URINALYSIS, ROUTINE W REFLEX MICROSCOPIC
Bilirubin Urine: NEGATIVE
Glucose, UA: NEGATIVE mg/dL
Hgb urine dipstick: NEGATIVE
Ketones, ur: NEGATIVE mg/dL
Leukocytes,Ua: NEGATIVE
Nitrite: NEGATIVE
Protein, ur: NEGATIVE mg/dL
Specific Gravity, Urine: 1.011 (ref 1.005–1.030)
pH: 8 (ref 5.0–8.0)

## 2019-08-23 NOTE — Progress Notes (Signed)
STROKE TEAM PROGRESS NOTE   INTERVAL HISTORY: The patient is still somewhat restless but this is improved significantly.  Vitals:   08/23/19 0500 08/23/19 0752 08/23/19 1144 08/23/19 1500  BP:  140/69 129/84 135/60  Pulse:  64 62 (!) 56  Resp:  18 20 18   Temp:  98.2 F (36.8 C) 97.8 F (36.6 C) 98.2 F (36.8 C)  TempSrc:  Oral Oral Oral  SpO2:  94% 98% 96%  Weight: 72.6 kg     Height:       CBC:  Recent Labs  Lab 08/22/19 0421 08/23/19 0431  WBC 7.7 9.5  HGB 8.6* 9.2*  HCT 27.4* 30.0*  MCV 95.8 96.8  PLT 411* 557*   Basic Metabolic Panel:  Recent Labs  Lab 08/22/19 0421 08/23/19 0431  NA 138 139  K 3.9 4.0  CL 102 102  CO2 27 27  GLUCOSE 122* 127*  BUN 23 24*  CREATININE 1.33* 1.26*  CALCIUM 8.9 9.0  MG 1.8  --   PHOS 2.7 3.4    IMAGING past 24 hours No results found.    PHYSICAL EXAM    General- thin, well developed elderly caucasian male, not in acute distress  Ophthalmologic- fundi not visualized due to noncooperation.  Cardiovascular - Regular rhythm and rate, no M/R/G  Neuro -he is laying in bed with eyes closed but opens eyes verbally to commands.  He does follow commands briskly and is mostly lucid today.  Speech is mildly dysarthric.  He is oriented to location, month and year.  He moves all 4 extremities well.  No abnormal movements are noted such as myoclonus or tremors.   ASSESSMENT/PLAN Stephen Copeland is a 79 y.o. male with history of Afib, systolic CHF, HLD, CAD, HTN, prostate cancer presenting with left sided weakness, facial droop, dysarthria.   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 embolic secondary to known AF even on Eliquis  Eliquis for VTE prophylaxis  Eliquis (apixaban) daily prior to admission, now on Brilinta (ticagrelor) 90 mg bid. Continue brilinta for total 3 months  and then switch to plavix. (per Dr. Estanislado Pandy).  Eliquis now on hold given bloody stool and anemia.  Therapy recommendations:  CIR - > no family support to provide care at home. 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 On metoprolol to 25 bid  . Was on eliquis since [facility] resolved and PEG was placed . Currently off eliquis d/t bloody stools and anemia  Hypertension  Home meds:  Hydralazine 50 tid, metoprolol 25 bid  On Hydralazine 25 mg Q8 and metoprolol 25 bid   Hyperlipidemia  Home meds:  No statin  LDL 90, goal < 70  pt refuses statin  AKI on CKD  Stage III  Cre 1.68->1.65 -> 1.49->1.32->1.28->1.31->1.38->1.44->1.34->1.33->1.26  Renal US R w/ increased echogenicity. 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 Secondary to stroke . SLP difficulty d/t pt participation. . D1 puree w/ honey thick liquids -> pt not swallowing and eating enough for daily needs -> Back to continuous feeding 5/30 . S/p PEG 08/04/19 and 08/13/19 . Speech and dietician on board . Intermittent nausea with small yellow vomitus 6/15 -> hold off TF and on IVF->resume 6/16 . Add  low dose reglan to facilitate GI mobility  . KUB Extensive colonic diverticulosis  BRBPR, resolved  Episodes of N, V and diarrhea throughout admission.  Pt did not tolerate CorTrak -> PEG place 08/04/19 and re-insert 08/13/19  Jevity TF 60 ml / hour with free water 150 mls Q 4 hrs  Portable Abd 08/02/19 - Less prominent dilatation of small bowel compared to the prior study with probable component of minimal residual ileus  Imodium 2 mg per tube Q 6 hrs for diarrhea since 07/25/19. Small blood clots in loose stool with recurrence. (No indication for colonoscopy)   Protonix 40 mg Bid per tube since 07/19/19 for GERD  Eliquis again on hold - pt still getting Brilinta  Hgb - 9.7....8.5->7.8->7.7->8.4->9.3->8.5->7.3->8.3->8.6->9.2  Will hold eliquis at d/c  Agitation -consistent with acute delirium.  The pt was extremely agitated today and required several doses of ativan to calm - felt at one point we made need to get a sitter  Unclear what prompted the episode  Pharmacy advised against any anti psychotics due to pt's hx of prolonged QT interval  Will order prn ativan  Labs stable; repeating labs including urinalysis. (UA hazy o/w normal)  Other Stroke Risk Factors  Advanced age  Coronary artery disease  Other Active Problems   BPH on flomax PTA  Prostate cancer   Hypokalemia - resolved   LBBB. Appears old. EKG neg ischemia. Trops 27->32  (Likely strain related)  Thrombocytosis - resolved  Insomnia - no longer on [facility] Day #45       To contact Stroke Continuity provider, please refer to [email or web address] After hours, contact General Neurology

## 2019-08-24 LAB — RENAL FUNCTION PANEL
Albumin: 2.5 g/dL — ABNORMAL LOW (ref 3.5–5.0)
Anion gap: 9 (ref 5–15)
BUN: 26 mg/dL — ABNORMAL HIGH (ref 8–23)
CO2: 26 mmol/L (ref 22–32)
Calcium: 8.9 mg/dL (ref 8.9–10.3)
Chloride: 103 mmol/L (ref 98–111)
Creatinine, Ser: 1.29 mg/dL — ABNORMAL HIGH (ref 0.61–1.24)
GFR calc Af Amer: >60 mL/min
GFR calc non Af Amer: 53 mL/min — ABNORMAL LOW
Glucose, Bld: 116 mg/dL — ABNORMAL HIGH (ref 70–99)
Phosphorus: 3.5 mg/dL (ref 2.5–4.6)
Potassium: 4.2 mmol/L (ref 3.5–5.1)
Sodium: 138 mmol/L (ref 135–145)

## 2019-08-24 LAB — GLUCOSE, CAPILLARY
Glucose-Capillary: 103 mg/dL — ABNORMAL HIGH (ref 70–99)
Glucose-Capillary: 112 mg/dL — ABNORMAL HIGH (ref 70–99)
Glucose-Capillary: 110 mg/dL — ABNORMAL HIGH (ref 70–99)
Glucose-Capillary: 112 mg/dL — ABNORMAL HIGH (ref 70–99)
Glucose-Capillary: 120 mg/dL — ABNORMAL HIGH (ref 70–99)
Glucose-Capillary: 109 mg/dL — ABNORMAL HIGH (ref 70–99)

## 2019-08-24 LAB — CBC
HCT: 30.1 % — ABNORMAL LOW (ref 39.0–52.0)
Hemoglobin: 9.4 g/dL — ABNORMAL LOW (ref 13.0–17.0)
MCH: 29.7 pg (ref 26.0–34.0)
MCHC: 31.2 g/dL (ref 30.0–36.0)
MCV: 95.3 fL (ref 80.0–100.0)
Platelets: 445 10*3/uL — ABNORMAL HIGH (ref 150–400)
RBC: 3.16 MIL/uL — ABNORMAL LOW (ref 4.22–5.81)
RDW: 16.4 % — ABNORMAL HIGH (ref 11.5–15.5)
WBC: 9.7 10*3/uL (ref 4.0–10.5)
nRBC: 0 % (ref 0.0–0.2)

## 2019-08-24 NOTE — Progress Notes (Signed)
STROKE TEAM PROGRESS NOTE   INTERVAL HISTORY: The patient is sitting up in bed.  Is asking for his wife.  Vital signs are stable.  No neurological changes.  Vitals:   08/24/19 0500 08/24/19 0738 08/24/19 1243 08/24/19 1542  BP:  120/72 119/73 134/80  Pulse:  78 66 74  Resp:  18 18 18   Temp:  97.7 F (36.5 C) 97.8 F (36.6 C) 98.2 F (36.8 C)  TempSrc:  Oral Oral Oral  SpO2:  96% 97% 94%  Weight: 78.9 kg     Height:       CBC:  Recent Labs  Lab 08/23/19 0431 08/24/19 0431  WBC 9.5 9.7  HGB 9.2* 9.4*  HCT 30.0* 30.1*  MCV 96.8 95.3  PLT 450* 001*   Basic Metabolic Panel:  Recent Labs  Lab 08/22/19 0421 08/22/19 0421 08/23/19 0431 08/24/19 0431  NA 138   < > 139 138  K 3.9   < > 4.0 4.2  CL 102   < > 102 103  CO2 27   < > 27 26  GLUCOSE 122*   < > 127* 116*  BUN 23   < > 24* 26*  CREATININE 1.33*   < > 1.26* 1.29*  CALCIUM 8.9   < > 9.0 8.9  MG 1.8  --   --   --   PHOS 2.7   < > 3.4 3.5   < > = values in this interval not displayed.    IMAGING past 24 hours No results found.    PHYSICAL EXAM    General- thin, well developed elderly caucasian male, not in acute distress  Ophthalmologic- fundi not visualized due to noncooperation.  Cardiovascular - Regular rhythm and rate, no M/R/G  Neuro -he is laying in bed with eyes closed but opens eyes verbally to commands.  He does follow commands briskly and is mostly lucid today.  Speech is mildly dysarthric.  He is oriented to location, month and year.  He moves all 4 extremities well.  No abnormal movements are noted such as myoclonus or tremors.   ASSESSMENT/PLAN [name] is a 79 y.o. male with history of Afib, systolic CHF, HLD, CAD, HTN, prostate cancer presenting with left sided weakness, facial droop, dysarthria.   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 embolic secondary to known AF even on Eliquis  Eliquis for VTE prophylaxis  Eliquis (apixaban) daily prior to admission, now on Brilinta (ticagrelor) 90 mg bid. Continue brilinta for total 3 months and then switch to plavix. (per Dr. Estanislado Pandy).  Eliquis now on hold given bloody stool and anemia.  Therapy recommendations:  CIR - > no family support to provide care at home. 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 On metoprolol to 25 bid  . Was on eliquis since [facility] resolved and PEG was  placed . Currently off eliquis d/t bloody stools and anemia  Hypertension  Home meds:  Hydralazine 50 tid, metoprolol 25 bid  On Hydralazine 25 mg Q8 and metoprolol 25 bid   Hyperlipidemia  Home meds:  No statin  LDL 90, goal < 70  pt refuses statin  AKI on CKD  Stage III  Cre 1.68->1.65 -> 1.49->1.32->1.28->1.31->1.38->1.44->1.34->1.33->1.26  Renal US R w/ increased echogenicity. No hydro  on TF and FW  Leukocytosis  WBC 7.2->12.0->8.6->5.8->9.0->7.7->9.5 (temp 98.7)  Afebrile  UA neg  CXR unremarkable  Continue monitoring  Chronic HFrEF Dilated Cardiomyopathy LBBB Prolonged QT  EF 30 to 40% since 2017  This admission EF 35 to 40%  CXR 6/1 no pulmonary edema  6/2 mild SOB with frequent coughing, concerning for fluid overload with IVF, TF and FW -> lasix IV 40mg  x 1  Dysphagia . Secondary to stroke . SLP difficulty d/t pt participation. . D1 puree w/ honey thick liquids -> pt not swallowing and  eating enough for daily needs -> Back to continuous feeding 5/30 . S/p PEG 08/04/19 and 08/13/19 . Speech and dietician on board . Intermittent nausea with small yellow vomitus 6/15 -> hold off TF and on IVF->resume 6/16 . Add low dose reglan to facilitate GI mobility  . KUB Extensive colonic diverticulosis  BRBPR, resolved  Episodes of N, V and diarrhea throughout admission.  Pt did not tolerate CorTrak -> PEG place 08/04/19 and re-insert 08/13/19  Jevity TF 60 ml / hour with free water 150 mls Q 4 hrs  Portable Abd 08/02/19 - Less prominent dilatation of small bowel compared to the prior study with probable component of minimal residual ileus  Imodium 2 mg per tube Q 6 hrs for diarrhea since 07/25/19. Small blood clots in loose stool with recurrence. (No indication for colonoscopy)   Protonix 40 mg Bid per tube since 07/19/19 for GERD  Eliquis again on hold - pt still getting Brilinta  Hgb - 9.7....8.5->7.8->7.7->8.4->9.3->8.5->7.3->8.3->8.6->9.2  Will hold eliquis at d/c  Agitation -consistent with acute delirium.  The pt was extremely agitated today and required several doses of ativan to calm - felt at one point we made need to get a sitter  Unclear what prompted the episode  Pharmacy advised against any anti psychotics due to pt's hx of prolonged QT interval  Will order prn ativan  Labs stable; repeating labs including urinalysis. (UA hazy o/w normal)  Other Stroke Risk Factors  Advanced age  Coronary artery disease  Other Active Problems   BPH on flomax PTA  Prostate cancer   Hypokalemia - resolved   LBBB. Appears old. EKG neg ischemia. Trops 27->32  (Likely strain related)  Thrombocytosis - resolved  Insomnia - no longer on [facility] Day #46   Patient is difficult to place in a nursing home.  Is medically stable but no nursing home bed available.  Continue ongoing treatment.  Discussed with social worker Antony Contras, MD    To contact Stroke  Continuity provider, please refer to [email or web address] After hours, contact General Neurology

## 2019-08-25 LAB — RENAL FUNCTION PANEL
Albumin: 2.4 g/dL — ABNORMAL LOW (ref 3.5–5.0)
Anion gap: 10 (ref 5–15)
BUN: 27 mg/dL — ABNORMAL HIGH (ref 8–23)
CO2: 26 mmol/L (ref 22–32)
Calcium: 8.7 mg/dL — ABNORMAL LOW (ref 8.9–10.3)
Chloride: 99 mmol/L (ref 98–111)
Creatinine, Ser: 1.43 mg/dL — ABNORMAL HIGH (ref 0.61–1.24)
GFR calc Af Amer: 54 mL/min — ABNORMAL LOW (ref >60)
GFR calc non Af Amer: 47 mL/min — ABNORMAL LOW (ref >60)
Glucose, Bld: 135 mg/dL — ABNORMAL HIGH (ref 70–99)
Phosphorus: 3.7 mg/dL (ref 2.5–4.6)
Potassium: 4.3 mmol/L (ref 3.5–5.1)
Sodium: 135 mmol/L (ref 135–145)

## 2019-08-25 LAB — GLUCOSE, CAPILLARY
Glucose-Capillary: 100 mg/dL — ABNORMAL HIGH (ref 70–99)
Glucose-Capillary: 115 mg/dL — ABNORMAL HIGH (ref 70–99)
Glucose-Capillary: 119 mg/dL — ABNORMAL HIGH (ref 70–99)
Glucose-Capillary: 127 mg/dL — ABNORMAL HIGH (ref 70–99)
Glucose-Capillary: 131 mg/dL — ABNORMAL HIGH (ref 70–99)
Glucose-Capillary: 136 mg/dL — ABNORMAL HIGH (ref 70–99)

## 2019-08-25 LAB — CBC
HCT: 28.5 % — ABNORMAL LOW (ref 39.0–52.0)
Hemoglobin: 9 g/dL — ABNORMAL LOW (ref 13.0–17.0)
MCH: 30.1 pg (ref 26.0–34.0)
MCHC: 31.6 g/dL (ref 30.0–36.0)
MCV: 95.3 fL (ref 80.0–100.0)
Platelets: 412 10*3/uL — ABNORMAL HIGH (ref 150–400)
RBC: 2.99 MIL/uL — ABNORMAL LOW (ref 4.22–5.81)
RDW: 16.7 % — ABNORMAL HIGH (ref 11.5–15.5)
WBC: 8.7 10*3/uL (ref 4.0–10.5)
nRBC: 0 % (ref 0.0–0.2)

## 2019-08-25 MED ORDER — PRO-STAT SUGAR FREE PO LIQD
30.0000 mL | Freq: Two times a day (BID) | ORAL | Status: DC
  Administered 2019-08-25 – 2019-08-26 (×2): 30 mL
  Filled 2019-08-25 (×2): qty 30

## 2019-08-25 MED ORDER — MELATONIN 3 MG PO TABS
3.0000 mg | ORAL_TABLET | Freq: Once | ORAL | Status: AC
  Administered 2019-08-25: 3 mg via ORAL
  Filled 2019-08-25: qty 1

## 2019-08-25 NOTE — Progress Notes (Signed)
STROKE TEAM PROGRESS NOTE   INTERVAL HISTORY: The patient is sitting up in bed. He keeps asking for his wife.  Vital signs are stable.  No neurological changes.  Vitals:   08/25/19 0742 08/25/19 1025 08/25/19 1154 08/25/19 1331  BP: 130/77 133/88 129/81 (!) 143/59  Pulse: 70 68 (!) 57   Resp: 19  20   Temp: 98.1 F (36.7 C)  97.6 F (36.4 C)   TempSrc: Oral  Oral   SpO2: 97%  97%   Weight:      Height:       CBC:  Recent Labs  Lab 08/24/19 0431 08/25/19 0501  WBC 9.7 8.7  HGB 9.4* 9.0*  HCT 30.1* 28.5*  MCV 95.3 95.3  PLT 445* 462*   Basic Metabolic Panel:  Recent Labs  Lab 08/22/19 0421 08/23/19 0431 08/24/19 0431 08/25/19 0501  NA 138   < > 138 135  K 3.9   < > 4.2 4.3  CL 102   < > 103 99  CO2 27   < > 26 26  GLUCOSE 122*   < > 116* 135*  BUN 23   < > 26* 27*  CREATININE 1.33*   < > 1.29* 1.43*  CALCIUM 8.9   < > 8.9 8.7*  MG 1.8  --   --   --   PHOS 2.7   < > 3.5 3.7   < > = values in this interval not displayed.    IMAGING past 24 hours No results found.    PHYSICAL EXAM    General- thin, well developed elderly caucasian male, not in acute distress  Ophthalmologic- fundi not visualized due to noncooperation.  Cardiovascular - Regular rhythm and rate, no M/R/G  Neuro -he is laying in bed with eyes closed but opens eyes verbally to commands.  He does follow commands briskly and is mostly lucid today.  Speech is mildly dysarthric.  He is oriented to location, month and year.  He moves all 4 extremities well.  No abnormal movements are noted such as myoclonus or tremors.   ASSESSMENT/PLAN [name] is a 79 y.o. male with history of Afib, systolic CHF, HLD, CAD, HTN, prostate cancer presenting with left sided weakness, facial droop, dysarthria.   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  embolic secondary to known AF even on Eliquis  Eliquis for VTE prophylaxis  Eliquis (apixaban) daily prior to admission, now on Brilinta (ticagrelor) 90 mg bid. Continue brilinta for total 3 months and then switch to plavix. (per Dr. Estanislado Pandy).  Eliquis now on hold given bloody stool and anemia.  Therapy recommendations:  CIR - > no family support to provide care at home. 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 On metoprolol to 25 bid  . Was on eliquis since [facility] resolved and PEG was placed .  Currently off eliquis d/t bloody stools and anemia  Hypertension  Home meds:  Hydralazine 50 tid, metoprolol 25 bid  On Hydralazine 25 mg Q8 and metoprolol 25 bid   Hyperlipidemia  Home meds:  No statin  LDL 90, goal < 70  pt refuses statin  AKI on CKD  Stage III  Cre 1.68->1.65 -> 1.49->1.32->1.28->1.31->1.38->1.44->1.34->1.33->1.26  Renal US R w/ increased echogenicity. No hydro  on TF and FW  Leukocytosis  WBC 7.2->12.0->8.6->5.8->9.0->7.7->9.5 (temp 98.7)  Afebrile  UA neg  CXR unremarkable  Continue monitoring  Chronic HFrEF Dilated Cardiomyopathy LBBB Prolonged QT  EF 30 to 40% since 2017  This admission EF 35 to 40%  CXR 6/1 no pulmonary edema  6/2 mild SOB with frequent coughing, concerning for fluid overload with IVF, TF and FW -> lasix IV 40mg  x 1  Dysphagia . Secondary to stroke . SLP difficulty d/t pt participation. . D1 puree w/ honey thick liquids -> pt not swallowing and eating enough for daily  needs -> Back to continuous feeding 5/30 . S/p PEG 08/04/19 and 08/13/19 . Speech and dietician on board . Intermittent nausea with small yellow vomitus 6/15 -> hold off TF and on IVF->resume 6/16 . Add low dose reglan to facilitate GI mobility  . KUB Extensive colonic diverticulosis  BRBPR, resolved  Episodes of N, V and diarrhea throughout admission.  Pt did not tolerate CorTrak -> PEG place 08/04/19 and re-insert 08/13/19  Jevity TF 60 ml / hour with free water 150 mls Q 4 hrs  Portable Abd 08/02/19 - Less prominent dilatation of small bowel compared to the prior study with probable component of minimal residual ileus  Imodium 2 mg per tube Q 6 hrs for diarrhea since 07/25/19. Small blood clots in loose stool with recurrence. (No indication for colonoscopy)   Protonix 40 mg Bid per tube since 07/19/19 for GERD  Eliquis again on hold - pt still getting Brilinta  Hgb - 9.7....8.5->7.8->7.7->8.4->9.3->8.5->7.3->8.3->8.6->9.2  Will hold eliquis at d/c  Agitation -consistent with acute delirium.  The pt was extremely agitated today and required several doses of ativan to calm - felt at one point we made need to get a sitter  Unclear what prompted the episode  Pharmacy advised against any anti psychotics due to pt's hx of prolonged QT interval  Will order prn ativan  Labs stable; repeating labs including urinalysis. (UA hazy o/w normal)  Other Stroke Risk Factors  Advanced age  Coronary artery disease  Other Active Problems   BPH on flomax PTA  Prostate cancer   Hypokalemia - resolved   LBBB. Appears old. EKG neg ischemia. Trops 27->32  (Likely strain related)  Thrombocytosis - resolved  Insomnia - no longer on [facility] Day #47   Patient is difficult to place in a nursing home.  Is medically stable but no nursing home bed available.  Continue ongoing treatment.    Antony Contras, MD    To contact Stroke Continuity provider, please refer to  [email or web address] After hours, contact General Neurology

## 2019-08-25 NOTE — Progress Notes (Signed)
Physical Therapy Treatment Patient Details Name: Stephen Copeland MRN: [id number] DOB: Feb 01, 1941 Today's Date: 08/25/2019    History of Present Illness 79 y.o. male admitted on 07/09/19 for L sided weakness, facial droop, and slurred speech.  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  He did not get any tPA as he was outside of the window.  Pt intubated 5/6 for procedure and extubated in the AM of 07/10/19.  Pt with other significant PMH of sinus brady, scoliosis, prostate CA s/p surgery, HTN, dilated cardiomyopathy, CAD, CHF.  Patient re-intubated 5/10 with inability to clear secretions.. Extubated 5/11. PEG placed 6/1.    PT Comments    Pt progressing slowly towards physical therapy goals. He was willing to participate and get OOB to chair this session. When PT arrived, pt was soiled in the bed. PT assisted with bath (NT present throughout session), and pt was able to dry his upper body and apply deodorant with set-up. Stedy utilized with +2 total assist required to achieve enough hip extension to clear the flaps of the [place]. Recommend Maxi-Move for return to bed (lift pad in room). Will continue to follow.    Follow Up Recommendations  SNF;Supervision/Assistance - 24 hour     Equipment Recommendations  Other (comment) (TBD at next venue of care)    Recommendations for Other Services       Precautions / Restrictions Precautions Precautions: Fall Precaution Comments: PEG Restrictions Weight Bearing Restrictions: No    Mobility  Bed Mobility Overal bed mobility: Needs Assistance Bed Mobility: Rolling;Sidelying to Sit Rolling: Min assist (to achieve complete roll) Sidelying to sit: HOB elevated;Max assist       General bed mobility comments: Assist for LE off EOB and then for trunk elevation to full sitting position.   Transfers Overall transfer level: Needs assistance   Transfers: Sit to/from Stand Sit to Stand: Total assist;+2 physical  assistance;From elevated surface         General transfer comment: Elevated bed height and several attempts with +2 total assist to achieve upright posture enough to clear flaps of Stedy.   Ambulation/Gait             General Gait Details: Unable to attempt at this time.    Stairs             Wheelchair Mobility    Modified Rankin (Stroke Patients Only) Modified Rankin (Stroke Patients Only) Pre-Morbid Rankin Score: No symptoms Modified Rankin: Severe disability     Balance Overall balance assessment: Needs assistance Sitting-balance support: Feet supported;Bilateral upper extremity supported Sitting balance-Leahy Scale: Poor Sitting balance - Comments: min guard to min assist for EOB balance, B hands on RW    Standing balance support: Bilateral upper extremity supported;During functional activity Standing balance-Leahy Scale: Poor Standing balance comment: relies on external and BUE support                             Cognition Arousal/Alertness: Awake/alert Behavior During Therapy: Anxious Overall Cognitive Status: Impaired/Different from baseline Area of Impairment: Attention;Memory;Following commands;Safety/judgement;Problem solving;Awareness                 Orientation Level:  (not tested) Current Attention Level: Sustained Memory: Decreased short-term memory Following Commands: Follows one step commands inconsistently Safety/Judgement: Decreased awareness of safety;Decreased awareness of deficits Awareness: Intellectual Problem Solving: Decreased initiation;Requires verbal cues;Requires tactile cues;Slow processing;Difficulty sequencing General Comments: pt with poor awareness of situation,  requires simple 1 step commands with increased time to engage in ADLs and mobility      Exercises      General Comments        Pertinent Vitals/Pain Pain Assessment: Faces Faces Pain Scale: No hurt    Home Living                       Prior Function            PT Goals (current goals can now be found in the care plan section) Acute Rehab PT Goals Patient Stated Goal: Get to rehab PT Goal Formulation: With patient Time For Goal Achievement: 09/04/19 Potential to Achieve Goals: Fair Progress towards PT goals: Progressing toward goals    Frequency    Min 2X/week      PT Plan Current plan remains appropriate    Co-evaluation              AM-PAC PT "6 Clicks" Mobility   Outcome Measure  Help needed turning from your back to your side while in a flat bed without using bedrails?: A Little Help needed moving from lying on your back to sitting on the side of a flat bed without using bedrails?: A Lot Help needed moving to and from a bed to a chair (including a wheelchair)?: Total Help needed standing up from a chair using your arms (e.g., wheelchair or bedside chair)?: Total Help needed to walk in hospital room?: Total Help needed climbing 3-5 steps with a railing? : Total 6 Click Score: 9    End of Session Equipment Utilized During Treatment: Gait belt Activity Tolerance: Patient limited by fatigue (weakness) Patient left: in chair;with call bell/phone within reach;with chair alarm set;with family/visitor present;with nursing/sitter in room Nurse Communication: Mobility status;Need for lift equipment (NT and RN present at end of session) PT Visit Diagnosis: Muscle weakness (generalized) (M62.81);Other symptoms and signs involving the nervous system (R29.898);Hemiplegia and hemiparesis Hemiplegia - Right/Left: Left Hemiplegia - dominant/non-dominant: Non-dominant Hemiplegia - caused by: Cerebral infarction     Time: 3154-0086 PT Time Calculation (min) (ACUTE ONLY): 45 min  Charges:  $Therapeutic Activity: 38-52 mins                     Rolinda Roan, PT, DPT Acute Rehabilitation Services Pager: [phone] Office: [phone]    Thelma Comp 08/25/2019, 2:34 PM

## 2019-08-25 NOTE — Progress Notes (Signed)
Nutrition Follow-up  DOCUMENTATION CODES:   Not applicable  INTERVENTION:  Continue via PEG: Jevity1.5 @ 50 ml/hr (1274m per day) 30 ml Prostat BID 2028mfree water Q4H(per MD)  Provides: 2000 kcal (meets 95% of needs), 106grams protein, and 912 ml free water(211278motal free water with flushes).  Continue Ensure Enlive po BID, each supplement provides 350 kcal and 20 grams of protein, thickened to appropriate consistency   NUTRITION DIAGNOSIS:   Inadequate oral intake related to dysphagia as evidenced by other (comment) (need for Dysphagia 1, honey thick liquids diet with minimal intake; need for PEG.).  Ongoing.  GOAL:   Patient will meet greater than or equal to 90% of their needs  Met with TF.   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  5/6 admitted s/p IR, remained intubated post-op 5/7 extubated; cortrak placed  5/10 N/V with normal KUB; intubated 5/11 extubated, cortrak removed 5/12 failed swallow eval; cortrak replaced as tube was inadvertently pulled with extubation 5/17 diet upgraded to dysphagia 1 with honey thick liquids 5/18 TF switched to nocturnal 5/20 TF returned to continuous due to poor po intake 6/1 s/p EGD and PEG placement 6/5 pt with bloody stools, TF held 6/7 TF resumed    Pt sleeping at time of RD visit.  RD observed meal tray, only bites taken. Noted that TF rate was adjusted by MD and Ensure Enlive was ordered.   PO Intake: 10% x2 recorded meals since last RD assessment   Pt's weight continues to fluctuate. Non-pitting edema to LLE noted.   UOP: 900m44m4 hours I/O: +49,391.1ml 92mce admit  TF via PEG: Jevity 1.5 cal @ 50ml/68m30ml P27mtat daily, 200ml fr52mater Q4H. This is providing 1900 kcals, 91 grams protein,  912ml fre51mter (2112ml tota14mee water with flushes)  Labs: CBGs 127-131-100 Medications: Ensure Enlive po BID, Novolog, Protonix  Diet Order:   Diet Order            DIET - DYS 1 Room service appropriate? Yes; Fluid consistency: Honey Thick  Diet effective 1400                 EDUCATION NEEDS:   No education needs have been identified at this time  Skin:  Skin Assessment: Skin Integrity Issues: Skin Integrity Issues:: Other (Comment) Incisions: groin Other: MASD groin  Last BM:  6/21 type 4  Height:   Ht Readings from Last 1 Encounters:  08/04/19 '6\' 2"'  (1.88 m)    Weight:   Wt Readings from Last 1 Encounters:  08/25/19 77.1 kg    Ideal Body Weight:  86.3 kg  BMI:  Body mass index is 21.83 kg/m.   Estimated Nutritional Needs:   Kcal:  2100-2300  Protein:  100-115 grams  Fluid:  >2 L/day    Jadrien Narine AveLarkin InaLDN RD pager number and weekend/on-call pager number located in [email or web address]

## 2019-08-26 LAB — GLUCOSE, CAPILLARY
Glucose-Capillary: 118 mg/dL — ABNORMAL HIGH (ref 70–99)
Glucose-Capillary: 120 mg/dL — ABNORMAL HIGH (ref 70–99)
Glucose-Capillary: 120 mg/dL — ABNORMAL HIGH (ref 70–99)
Glucose-Capillary: 125 mg/dL — ABNORMAL HIGH (ref 70–99)
Glucose-Capillary: 127 mg/dL — ABNORMAL HIGH (ref 70–99)
Glucose-Capillary: 128 mg/dL — ABNORMAL HIGH (ref 70–99)

## 2019-08-26 LAB — RESPIRATORY PANEL BY RT PCR (FLU A&B, COVID)
Influenza A by PCR: NEGATIVE
Influenza B by PCR: NEGATIVE
SARS Coronavirus 2 by RT PCR: NEGATIVE

## 2019-08-26 MED ORDER — PRO-STAT SUGAR FREE PO LIQD: 30.0000 mL | Freq: Two times a day (BID) | ORAL | 0 refills | Status: DC

## 2019-08-26 MED ORDER — METOPROLOL TARTRATE 25 MG/10 ML ORAL SUSPENSION: 25.0000 mg | Freq: Two times a day (BID) | ORAL | Status: DC

## 2019-08-26 MED ORDER — JEVITY 1.5 CAL/FIBER PO LIQD: 1000.0000 mL | ORAL | Status: DC

## 2019-08-26 MED ORDER — APIXABAN 5 MG PO TABS
5.0000 mg | ORAL_TABLET | Freq: Two times a day (BID) | ORAL | Status: DC
  Administered 2019-08-26: 5 mg via ORAL
  Filled 2019-08-26: qty 1

## 2019-08-26 MED ORDER — TICAGRELOR 90 MG PO TABS: 90.0000 mg | ORAL_TABLET | Freq: Two times a day (BID) | ORAL | Status: DC

## 2019-08-26 MED ORDER — LIDOCAINE 5 % EX OINT: 1.0000 "application " | TOPICAL_OINTMENT | Freq: Two times a day (BID) | CUTANEOUS | 0 refills | Status: DC | PRN

## 2019-08-26 MED ORDER — RESOURCE THICKENUP CLEAR PO POWD: 1.0000 | ORAL | Status: DC | PRN

## 2019-08-26 MED ORDER — GERHARDT'S BUTT CREAM: 1.0000 "application " | TOPICAL_CREAM | Freq: Three times a day (TID) | CUTANEOUS | Status: AC | PRN

## 2019-08-26 MED ORDER — PANTOPRAZOLE SODIUM 40 MG PO PACK: 40.0000 mg | PACK | Freq: Every day | ORAL | Status: DC

## 2019-08-26 MED ORDER — FREE WATER: 200.0000 mL | Status: DC

## 2019-08-26 MED ORDER — HYDRALAZINE HCL 25 MG PO TABS: 25.0000 mg | ORAL_TABLET | Freq: Three times a day (TID) | ORAL | Status: DC

## 2019-08-26 MED ORDER — ENSURE ENLIVE PO LIQD: 237.0000 mL | Freq: Two times a day (BID) | ORAL | 12 refills | Status: DC

## 2019-08-26 NOTE — TOC Transition Note (Signed)
Transition of Care [facility]) - CM/SW Discharge Note   Patient Details  Name: Stephen Copeland MRN: [id number] Date of Birth: April 20, 1940  Transition of Care [facility]) CM/SW Contact:  Geralynn Ochs, LCSW Phone Number: 08/26/2019, 2:23 PM   Clinical Narrative:   Nurse to call report to [phone], Room 120.    Final next level of care: Skilled Nursing Facility Barriers to Discharge: Barriers Resolved   Patient Goals and CMS Choice Patient states their goals for this hospitalization and ongoing recovery are:: Pt's wife states she is agreeable to SNF. CMS Medicare.gov Compare Post Acute Care list provided to:: Patient Represenative (must comment) (spouse) Choice offered to / list presented to : Spouse  Discharge Placement              Patient chooses bed at:  [facility]) Patient to be transferred to facility by: [place] Name of family member notified: Self, wife Patient and family notified of of transfer: 08/26/19  Discharge Plan and Services                                     Social Determinants of Health (SDOH) Interventions     Readmission Risk Interventions No flowsheet data found.

## 2019-08-26 NOTE — Progress Notes (Signed)
Occupational Therapy Treatment Patient Details Name: Stephen Copeland MRN: [id number] DOB: [birth year] Today's Date: 08/26/2019    History of present illness 79 y.o. male admitted on 07/09/19 for L sided weakness, facial droop, and slurred speech.  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  He did not get any tPA as he was outside of the window.  Pt intubated 5/6 for procedure and extubated in the AM of 07/10/19.  Pt with other significant PMH of sinus brady, scoliosis, prostate CA s/p surgery, HTN, dilated cardiomyopathy, CAD, CHF.  Patient re-intubated 5/10 with inability to clear secretions.. Extubated 5/11. PEG placed 6/1.   OT comments  Pt progressing well with session and pt appeared to be enthusiastic about discharge to SNF today. Pt agreeable to therapy- pt found soiled and totalA for pericare; pt rolling side to side with minguardA at times requiring verbal cues ot stay on side. Pt very talkative and jovial today. Pt minA for supine to sitting EOB with use of rail. Pt performing set-upA for grooming tasks at EOB: pt stood minA +2 x2 times to scoot up toward [place]. Pt able to take 2 steps. MMT on BUEs 4/5 MM grade. Pt would benefit from continued OT skilled services for energy conservation and ADL activity tolerance. OT following acutely.     Follow Up Recommendations  SNF    Equipment Recommendations  Other (comment) (defer to next facility)    Recommendations for Other Services      Precautions / Restrictions Precautions Precautions: Fall Precaution Comments: PEG Restrictions Weight Bearing Restrictions: No       Mobility Bed Mobility Overal bed mobility: Needs Assistance Bed Mobility: Rolling;Supine to Sit;Sit to Supine Rolling: Min guard Sidelying to sit: Min assist Supine to sit: Min assist     General bed mobility comments: Pt requires assist for BLE movement and trunk elevation  Transfers Overall transfer level: Needs  assistance Equipment used: Rolling walker (2 wheeled) Transfers: Sit to/from [employer] Sit to Stand: Min assist;+2 physical assistance   Squat pivot transfers: Min assist;+2 physical assistance     General transfer comment: MinA +2 for transfers due to decreased awareness of safety and instability in standing.    Balance Overall balance assessment: Needs assistance Sitting-balance support: Feet supported;Bilateral upper extremity supported Sitting balance-Leahy Scale: Fair     Standing balance support: Bilateral upper extremity supported Standing balance-Leahy Scale: Poor Standing balance comment: relies on external and BUE support                            ADL either performed or assessed with clinical judgement   ADL Overall ADL's : Needs assistance/impaired   Eating/Feeding Details (indicate cue type and reason): tube feed running Grooming: Set up;Sitting                   Toilet Transfer: Minimal assistance;+2 for physical assistance Toilet Transfer Details (indicate cue type and reason): Pt simulating as pt stepping towards [facility] Toileting- Clothing Manipulation and Hygiene: Total assistance;Bed level Toileting - Clothing Manipulation Details (indicate cue type and reason): incontinent of BM and requiring totalA for task       General ADL Comments: Pt agreeable to session and following all commnads. Pt appeared be excited to d/c to SNF today.     Vision   Vision Assessment?: Vision impaired- to be further tested in functional context Additional Comments: wears glasses   Perception  Praxis      Cognition Arousal/Alertness: Awake/alert Behavior During Therapy: WFL for tasks assessed/performed Overall Cognitive Status: No family/caregiver present to determine baseline cognitive functioning Area of Impairment: Safety/judgement                         Safety/Judgement: Decreased awareness of safety;Decreased  awareness of deficits     General Comments: Cues for abruptly sitting down. Pt looked for cues to find out what name/dob and date was on the walll. Pt in a very jovial mood today.        Exercises     Shoulder Instructions       General Comments      Pertinent Vitals/ Pain       Pain Assessment: Faces Faces Pain Scale: No hurt  Home Living                                          Prior Functioning/Environment              Frequency  Min 2X/week        Progress Toward Goals  OT Goals(current goals can now be found in the care plan section)  Progress towards OT goals: Progressing toward goals  Acute Rehab OT Goals Patient Stated Goal: Get to rehab OT Goal Formulation: With patient Time For Goal Achievement: 09/09/19 Potential to Achieve Goals: Fair ADL Goals Pt Will Perform Grooming: with min guard assist;standing Pt Will Perform Lower Body Dressing: with min assist;sitting/lateral leans;sit to/from stand Pt Will Transfer to Toilet: with min assist;stand pivot transfer;bedside commode Additional ADL Goal #1: Pt will tolerate x10 mins of ADL tasks with x2 rest breaks in order to increase activity tolerance. Additional ADL Goal #2: Pt will verbalize 3 strategies to reduce risk of falls during ADL/mobility tasks with minimal cues for redirection.  Plan Discharge plan remains appropriate;Frequency remains appropriate    Co-evaluation                 AM-PAC OT "6 Clicks" Daily Activity     Outcome Measure   Help from another person eating meals?: A Little Help from another person taking care of personal grooming?: A Little Help from another person toileting, which includes using toliet, bedpan, or urinal?: Total Help from another person bathing (including washing, rinsing, drying)?: A Little Help from another person to put on and taking off regular upper body clothing?: A Little Help from another person to put on and taking off regular  lower body clothing?: A Lot 6 Click Score: 15    End of Session Equipment Utilized During Treatment: Gait belt;Rolling walker;Oxygen  OT Visit Diagnosis: Unsteadiness on feet (R26.81);Other abnormalities of gait and mobility (R26.89);Muscle weakness (generalized) (M62.81);Other symptoms and signs involving cognitive function   Activity Tolerance Patient tolerated treatment well   Patient Left in bed;with call bell/phone within reach;with bed alarm set   Nurse Communication Mobility status        Time: 5397-6734 OT Time Calculation (min): 40 min  Charges: OT General Charges $OT Visit: 1 Visit OT Treatments $Self Care/Home Management : 23-37 mins $Therapeutic Activity: 8-22 mins  Stephen Copeland, OTR/L Acute Rehabilitation Services Pager: [phone] Office: [phone]    Stephen Copeland C 08/26/2019, 3:04 PM

## 2019-08-26 NOTE — Progress Notes (Signed)
ANTICOAGULATION CONSULT NOTE - Initial Consult  Pharmacy Consult for apixaban Indication: nonvalvular afib  No Known Allergies  Patient Measurements: Height: 6\' 2"  (188 cm) Weight: 77.1 kg (170 lb) IBW/kg (Calculated) : 82.2   Vital Signs: Temp: 98 F (36.7 C) (06/23 1127) Temp Source: Oral (06/23 1127) BP: 133/63 (06/23 1334) Pulse Rate: 67 (06/23 1127)  Labs: Recent Labs    08/24/19 0431 08/25/19 0501  HGB 9.4* 9.0*  HCT 30.1* 28.5*  PLT 445* 412*  CREATININE 1.29* 1.43*    Estimated Creatinine Clearance: 46.4 mL/min (A) (by C-G formula based on SCr of 1.43 mg/dL (H)).   Medical History: Past Medical History:  Diagnosis Date  . Acid reflux   . CHF (congestive heart failure) ([place])   . Coronary artery disease   . Dilated cardiomyopathy ([place])   . Folliculitis   . Gastric ulcer   . Hiatal hernia   . Hyperlipidemia    a. pt is adamantly against statins.  . Hypertension   . Ischemia    a. Pt states he was diagnosed with "ischemia" in the 1990s but does not know further details, denies hx of heart blockage.  . Nummular dermatitis   . Prostate cancer ([place])   . Renal disorder   . Scoliosis   . Sinus bradycardia   . Stomach ulcer    a. remote ulcer in the 1990s, no hx of bleeding.   Assessment: 79 yo male with afib on apixaban PTA. Apixaban held for bloody stools and anemia. MD now wishes to restart apixaban inpatient. No further bleeding noted.   Goal of Therapy:   Monitor platelets by anticoagulation protocol: Yes   Plan:  Resume Apixaban 5mg  PO BID this afternoon.  Monitor for bleeding.  Pharmacy will sign off  Paydon Carll A. Levada Dy, PharmD, BCPS, FNKF Clinical Pharmacist [place] Please utilize Amion for appropriate phone number to reach the unit pharmacist ([place])   08/26/2019,2:47 PM

## 2019-08-26 NOTE — Progress Notes (Signed)
Report given to Purvis Sheffield at [facility].

## 2019-08-26 NOTE — Progress Notes (Signed)
Pt transferred to [facility]. Packet given to transporters. Updated VS given. Recent medications reported to Transporters.

## 2019-08-31 ENCOUNTER — Telehealth (HOSPITAL_COMMUNITY): Payer: Self-pay

## 2019-08-31 NOTE — Telephone Encounter (Signed)
Called to schedule f/u, no answer, left vm. AW 

## 2019-09-01 ENCOUNTER — Other Ambulatory Visit: Payer: Self-pay

## 2019-09-01 ENCOUNTER — Inpatient Hospital Stay (HOSPITAL_COMMUNITY)
Admission: EM | Admit: 2019-09-01 | Discharge: 2019-09-08 | DRG: 393 | Disposition: A | Discharge status: Skilled Nursing Facility | Payer: Medicare Other | Source: Skilled Nursing Facility | Attending: Internal Medicine | Admitting: Internal Medicine

## 2019-09-01 DIAGNOSIS — Z95820 Peripheral vascular angioplasty status with implants and grafts: Secondary | ICD-10-CM

## 2019-09-01 DIAGNOSIS — Z7902 Long term (current) use of antithrombotics/antiplatelets: Secondary | ICD-10-CM

## 2019-09-01 DIAGNOSIS — E162 Hypoglycemia, unspecified: Secondary | ICD-10-CM | POA: Diagnosis present

## 2019-09-01 DIAGNOSIS — K219 Gastro-esophageal reflux disease without esophagitis: Secondary | ICD-10-CM | POA: Diagnosis present

## 2019-09-01 DIAGNOSIS — Z8673 Personal history of transient ischemic attack (TIA), and cerebral infarction without residual deficits: Secondary | ICD-10-CM

## 2019-09-01 DIAGNOSIS — I130 Hypertensive heart and chronic kidney disease with heart failure and stage 1 through stage 4 chronic kidney disease, or unspecified chronic kidney disease: Secondary | ICD-10-CM | POA: Diagnosis present

## 2019-09-01 DIAGNOSIS — I5022 Chronic systolic (congestive) heart failure: Secondary | ICD-10-CM | POA: Diagnosis present

## 2019-09-01 DIAGNOSIS — R54 Age-related physical debility: Secondary | ICD-10-CM | POA: Diagnosis present

## 2019-09-01 DIAGNOSIS — E872 Acidosis: Secondary | ICD-10-CM | POA: Diagnosis present

## 2019-09-01 DIAGNOSIS — E785 Hyperlipidemia, unspecified: Secondary | ICD-10-CM | POA: Diagnosis present

## 2019-09-01 DIAGNOSIS — K921 Melena: Secondary | ICD-10-CM

## 2019-09-01 DIAGNOSIS — Z7401 Bed confinement status: Secondary | ICD-10-CM

## 2019-09-01 DIAGNOSIS — R1312 Dysphagia, oropharyngeal phase: Secondary | ICD-10-CM | POA: Diagnosis present

## 2019-09-01 DIAGNOSIS — Z8249 Family history of ischemic heart disease and other diseases of the circulatory system: Secondary | ICD-10-CM

## 2019-09-01 DIAGNOSIS — Z8711 Personal history of peptic ulcer disease: Secondary | ICD-10-CM

## 2019-09-01 DIAGNOSIS — K922 Gastrointestinal hemorrhage, unspecified: Secondary | ICD-10-CM

## 2019-09-01 DIAGNOSIS — Z7901 Long term (current) use of anticoagulants: Secondary | ICD-10-CM

## 2019-09-01 DIAGNOSIS — Z931 Gastrostomy status: Secondary | ICD-10-CM

## 2019-09-01 DIAGNOSIS — I255 Ischemic cardiomyopathy: Secondary | ICD-10-CM | POA: Diagnosis present

## 2019-09-01 DIAGNOSIS — K5731 Diverticulosis of large intestine without perforation or abscess with bleeding: Secondary | ICD-10-CM | POA: Diagnosis present

## 2019-09-01 DIAGNOSIS — Z20822 Contact with and (suspected) exposure to covid-19: Secondary | ICD-10-CM | POA: Diagnosis present

## 2019-09-01 DIAGNOSIS — I609 Nontraumatic subarachnoid hemorrhage, unspecified: Secondary | ICD-10-CM

## 2019-09-01 DIAGNOSIS — Y842 Radiological procedure and radiotherapy as the cause of abnormal reaction of the patient, or of later complication, without mention of misadventure at the time of the procedure: Secondary | ICD-10-CM | POA: Diagnosis present

## 2019-09-01 DIAGNOSIS — K627 Radiation proctitis: Principal | ICD-10-CM | POA: Diagnosis present

## 2019-09-01 DIAGNOSIS — Z9079 Acquired absence of other genital organ(s): Secondary | ICD-10-CM

## 2019-09-01 DIAGNOSIS — N1832 Chronic kidney disease, stage 3b: Secondary | ICD-10-CM | POA: Diagnosis present

## 2019-09-01 DIAGNOSIS — Z79899 Other long term (current) drug therapy: Secondary | ICD-10-CM

## 2019-09-01 DIAGNOSIS — I2510 Atherosclerotic heart disease of native coronary artery without angina pectoris: Secondary | ICD-10-CM | POA: Diagnosis present

## 2019-09-01 DIAGNOSIS — Z03818 Encounter for observation for suspected exposure to other biological agents ruled out: Secondary | ICD-10-CM | POA: Diagnosis not present

## 2019-09-01 DIAGNOSIS — I480 Paroxysmal atrial fibrillation: Secondary | ICD-10-CM | POA: Diagnosis present

## 2019-09-01 DIAGNOSIS — I420 Dilated cardiomyopathy: Secondary | ICD-10-CM | POA: Diagnosis present

## 2019-09-01 DIAGNOSIS — D631 Anemia in chronic kidney disease: Secondary | ICD-10-CM | POA: Diagnosis present

## 2019-09-01 DIAGNOSIS — Z8546 Personal history of malignant neoplasm of prostate: Secondary | ICD-10-CM

## 2019-09-01 LAB — CBC WITH DIFFERENTIAL/PLATELET
Abs Immature Granulocytes: 0.03 10*3/uL (ref 0.00–0.07)
Basophils Absolute: 0.1 10*3/uL (ref 0.0–0.1)
Basophils Relative: 1 %
Eosinophils Absolute: 0.1 10*3/uL (ref 0.0–0.5)
Eosinophils Relative: 1 %
HCT: 37 % — ABNORMAL LOW (ref 39.0–52.0)
Hemoglobin: 11.5 g/dL — ABNORMAL LOW (ref 13.0–17.0)
Immature Granulocytes: 0 %
Lymphocytes Relative: 30 %
Lymphs Abs: 2.9 10*3/uL (ref 0.7–4.0)
MCH: 30.3 pg (ref 26.0–34.0)
MCHC: 31.1 g/dL (ref 30.0–36.0)
MCV: 97.4 fL (ref 80.0–100.0)
Monocytes Absolute: 1.2 10*3/uL — ABNORMAL HIGH (ref 0.1–1.0)
Monocytes Relative: 12 %
Neutro Abs: 5.3 10*3/uL (ref 1.7–7.7)
Neutrophils Relative %: 56 %
Platelets: 450 10*3/uL — ABNORMAL HIGH (ref 150–400)
RBC: 3.8 MIL/uL — ABNORMAL LOW (ref 4.22–5.81)
RDW: 17.5 % — ABNORMAL HIGH (ref 11.5–15.5)
WBC: 9.5 10*3/uL (ref 4.0–10.5)
nRBC: 0 % (ref 0.0–0.2)

## 2019-09-01 MED ORDER — PANTOPRAZOLE SODIUM 40 MG IV SOLR
40.0000 mg | Freq: Once | INTRAVENOUS | Status: AC
  Administered 2019-09-01: 40 mg via INTRAVENOUS
  Filled 2019-09-01: qty 40

## 2019-09-01 MED ORDER — SODIUM CHLORIDE 0.9 % IV BOLUS
250.0000 mL | Freq: Once | INTRAVENOUS | Status: AC
  Administered 2019-09-01: 250 mL via INTRAVENOUS

## 2019-09-01 NOTE — ED Provider Notes (Signed)
[facility] DEPT Provider Note: Georgena Spurling, MD, FACEP  CSN: [id number] MRN: [id number] ARRIVAL: 09/01/19 at 2303 ROOM: [place]  Rectal Bleeding   HISTORY OF PRESENT ILLNESS  09/01/19 11:22 PM Macari Zalesky is a 79 y.o. male who had a stroke on 07/09/2019 (right MCA territory infarct) complicated by subarachnoid hemorrhage 2 days later.  He was an inpatient at [facility] for this.  He had an upper endoscopy on 08/04/2019.  He is currently in rehab.  He was sent from his rehab facility for rectal bleeding that began this evening.  He states that he has been passing clots the size of a golf ball.  He is on Eliquis.  He denies any rectal pain or abdominal pain.  He denies any chest pain, shortness of breath or lightheadedness.  He is not ambulatory because of the stroke but does not have unilateral weakness.   Past Medical History:  Diagnosis Date  . Acid reflux   . CHF (congestive heart failure) ([place])   . Coronary artery disease   . Dilated cardiomyopathy ([place])   . Folliculitis   . Gastric ulcer   . Hiatal hernia   . Hyperlipidemia    a. pt is adamantly against statins.  . Hypertension   . Ischemia    a. Pt states he was diagnosed with "ischemia" in the 1990s but does not know further details, denies hx of heart blockage.  . Nummular dermatitis   . Prostate cancer ([place])   . Renal disorder   . Scoliosis   . Sinus bradycardia   . Stomach ulcer    a. remote ulcer in the 1990s, no hx of bleeding.    Past Surgical History:  Procedure Laterality Date  . ESOPHAGOGASTRODUODENOSCOPY (EGD) WITH PROPOFOL N/A 08/04/2019   Procedure: ESOPHAGOGASTRODUODENOSCOPY (EGD) WITH PROPOFOL;  Surgeon: Jesusita Oka, MD;  Location: [facility];  Service: General;  Laterality: N/A;  . INGUINAL HERNIA REPAIR Left   . IR ANGIO INTRA EXTRACRAN SEL COM CAROTID INNOMINATE UNI L MOD SED  07/10/2019  . IR ANGIO VERTEBRAL SEL SUBCLAVIAN INNOMINATE UNI R MOD SED  07/10/2019  . IR CT HEAD  LTD  07/10/2019  . IR INTRAVSC STENT CERV CAROTID W/O EMB-PROT MOD SED INC ANGIO  07/10/2019  . IR PERCUTANEOUS ART THROMBECTOMY/INFUSION INTRACRANIAL INC DIAG ANGIO  07/10/2019  . IR REPLC GASTRO/COLONIC TUBE PERCUT W/FLUORO  08/13/2019  . PEG PLACEMENT N/A 08/04/2019   Procedure: PERCUTANEOUS ENDOSCOPIC GASTROSTOMY (PEG) PLACEMENT;  Surgeon: Jesusita Oka, MD;  Location: [facility];  Service: General;  Laterality: N/A;  . RADIOLOGY WITH ANESTHESIA N/A 07/09/2019   Procedure: IR WITH ANESTHESIA;  Surgeon: Luanne Bras, MD;  Location: [facility];  Service: Radiology;  Laterality: N/A;    Family History  Problem Relation Age of Onset  . Heart disease Mother        Further details not reported, died at age 16  . Valvular heart disease Father        H/o MV surgery, diet at age 87    Social History   Tobacco Use  . Smoking status: Never Smoker  . Smokeless tobacco: Never Used  Vaping Use  . Vaping Use: Never used  Substance Use Topics  . Alcohol use: No  . Drug use: No    Prior to Admission medications   Medication Sig Start Date End Date Taking? Authorizing Provider  acetaminophen (TYLENOL) 325 MG tablet Take 650 mg by mouth every 6 (six) hours as  needed for mild pain.   Yes [provider]  Amino Acids-Protein Hydrolys (FEEDING SUPPLEMENT, PRO-STAT SUGAR FREE 64,) LIQD Place 30 mLs into feeding tube 2 (two) times daily. 08/26/19  Yes Donzetta Starch, NP  clopidogrel (PLAVIX) 75 MG tablet Take 75 mg by mouth in the morning and at bedtime.   Yes [provider]  feeding supplement, ENSURE ENLIVE, (ENSURE ENLIVE) LIQD Take 237 mLs by mouth 2 (two) times daily between meals. 08/26/19  Yes Donzetta Starch, NP  hydrALAZINE (APRESOLINE) 25 MG tablet Place 1 tablet (25 mg total) into feeding tube every 8 (eight) hours. 08/26/19  Yes Donzetta Starch, NP  lidocaine (XYLOCAINE) 5 % ointment Apply 1 application topically 2 (two) times daily as needed (pain). 08/26/19  Yes Donzetta Starch,  NP  Maltodextrin-Xanthan Gum (RESOURCE THICKENUP CLEAR) POWD Take 120 g by mouth as needed (for honey thick liquid consistency). 08/26/19  Yes Donzetta Starch, NP  metoprolol tartrate (LOPRESSOR) 25 mg/10 mL SUSP Place 10 mLs (25 mg total) into feeding tube 2 (two) times daily. 08/26/19  Yes Donzetta Starch, NP  Nystatin (GERHARDT'S BUTT CREAM) CREA Apply 1 application topically every 8 (eight) hours as needed for irritation. 08/26/19  Yes Donzetta Starch, NP  pantoprazole sodium (PROTONIX) 40 mg/20 mL PACK Place 20 mLs (40 mg total) into feeding tube daily. 08/26/19  Yes Donzetta Starch, NP  Nutritional Supplements (FEEDING SUPPLEMENT, JEVITY 1.5 CAL/FIBER,) LIQD Place 1,000 mLs into feeding tube continuous. 08/26/19   Donzetta Starch, NP  ticagrelor (BRILINTA) 90 MG TABS tablet Place 1 tablet (90 mg total) into feeding tube 2 (two) times daily. Patient not taking: Reported on 09/02/2019 08/26/19   Donzetta Starch, NP  Water For Irrigation, Sterile (FREE WATER) SOLN Place 200 mLs into feeding tube every 4 (four) hours. 08/26/19   Donzetta Starch, NP    Allergies Patient has no known allergies.   REVIEW OF SYSTEMS  Negative except as noted here or in the History of Present Illness.   PHYSICAL EXAMINATION  Initial Vital Signs Blood pressure (!) 103/34, pulse 75, temperature 98.5 F (36.9 C), temperature source Oral, resp. rate 18, SpO2 97 %.  Examination General: Well-developed, well-nourished male in no acute distress; appearance consistent with age of record HENT: normocephalic; atraumatic Eyes: pupils equal, round and reactive to light; extraocular muscles intact Neck: supple Heart: regular rate and rhythm Lungs: clear to auscultation bilaterally Abdomen: soft; nondistended; nontender; gastrostomy tube left upper quadrant; bowel sounds present Rectal: Gross red blood per rectum Extremities: No deformity; full range of motion; pulses normal Neurologic: Awake, alert and oriented x 2; motor  function intact in all extremities and symmetric; slight dysarthria and left facial droop Skin: Warm and dry Psychiatric: Normal mood and affect   RESULTS  Summary of this visit's results, reviewed and interpreted by myself:   EKG Interpretation  Date/Time:    Ventricular Rate:    PR Interval:    QRS Duration:   QT Interval:    QTC Calculation:   R Axis:     Text Interpretation:        Laboratory Studies: Results for orders placed or performed during the hospital encounter of 09/01/19 (from the past 24 hour(s))  Comprehensive metabolic panel     Status: Abnormal   Collection Time: 09/01/19 11:25 PM  Result Value Ref Range   Sodium 143 135 - 145 mmol/L   Potassium 4.6 3.5 - 5.1 mmol/L   Chloride 105  98 - 111 mmol/L   CO2 29 22 - 32 mmol/L   Glucose, Bld 101 (H) 70 - 99 mg/dL   BUN 42 (H) 8 - 23 mg/dL   Creatinine, Ser 1.56 (H) 0.61 - 1.24 mg/dL   Calcium 9.4 8.9 - 10.3 mg/dL   Total Protein 7.3 6.5 - 8.1 g/dL   Albumin 3.4 (L) 3.5 - 5.0 g/dL   AST 36 15 - 41 U/L   ALT 63 (H) 0 - 44 U/L   Alkaline Phosphatase 122 38 - 126 U/L   Total Bilirubin 0.7 0.3 - 1.2 mg/dL   GFR calc non Af Amer 42 (L) >60 mL/min   GFR calc Af Amer 49 (L) >60 mL/min   Anion gap 9 5 - 15  CBC WITH DIFFERENTIAL     Status: Abnormal   Collection Time: 09/01/19 11:25 PM  Result Value Ref Range   WBC 9.5 4.0 - 10.5 K/uL   RBC 3.80 (L) 4.22 - 5.81 MIL/uL   Hemoglobin 11.5 (L) 13.0 - 17.0 g/dL   HCT 37.0 (L) 39 - 52 %   MCV 97.4 80.0 - 100.0 fL   MCH 30.3 26.0 - 34.0 pg   MCHC 31.1 30.0 - 36.0 g/dL   RDW 17.5 (H) 11.5 - 15.5 %   Platelets 450 (H) 150 - 400 K/uL   nRBC 0.0 0.0 - 0.2 %   Neutrophils Relative % 56 %   Neutro Abs 5.3 1.7 - 7.7 K/uL   Lymphocytes Relative 30 %   Lymphs Abs 2.9 0.7 - 4.0 K/uL   Monocytes Relative 12 %   Monocytes Absolute 1.2 (H) 0 - 1 K/uL   Eosinophils Relative 1 %   Eosinophils Absolute 0.1 0 - 0 K/uL   Basophils Relative 1 %   Basophils Absolute 0.1 0 - 0  K/uL   Immature Granulocytes 0 %   Abs Immature Granulocytes 0.03 0.00 - 0.07 K/uL  Type and screen [place]     Status: None   Collection Time: 09/01/19 11:25 PM  Result Value Ref Range   ABO/RH(D) B POS    Antibody Screen NEG    Sample Expiration      09/04/2019,2359 Performed at [facility], [place] [address], [place], [place] [address]   ABO/Rh     Status: None   Collection Time: 09/01/19 11:25 PM  Result Value Ref Range   ABO/RH(D)      B POS Performed at [facility], [place] [address] [place], [place] [address]    Imaging Studies: No results found.  ED COURSE and MDM  Nursing notes, initial and subsequent vitals signs, including pulse oximetry, reviewed and interpreted by myself.  Vitals:   09/01/19 2315 09/02/19 0030  BP: (!) 103/34 131/75  Pulse: 75 72  Resp: 18   Temp: 98.5 F (36.9 C)   TempSrc: Oral   SpO2: 97% 95%   Medications  pantoprazole (PROTONIX) injection 40 mg (40 mg Intravenous Given 09/01/19 2346)  sodium chloride 0.9 % bolus 250 mL (250 mLs Intravenous New Bag/Given 09/01/19 2346)   Dr. Hal Hope to admit for acute lower GI bleed.  Patient appears hemodynamically stable and I do not believe Eppie Gibson is indicated at this time.   PROCEDURES  Procedures   ED DIAGNOSES     ICD-10-CM   1. Lower GI bleed  K92.2        Terance Pomplun, MD 09/02/19 [phone]

## 2019-09-01 NOTE — ED Triage Notes (Signed)
Per PTAR: Patient is brought in by [facility] for rectal bleeding. Patient is coming from [place] 134/80, HR 76, 93% on RA, 16 RR Patient is non-ambulatory and is at facility for rehab.  Patient is alert and oriented. Patient reportedly had a stroke back in May.

## 2019-09-02 ENCOUNTER — Encounter (HOSPITAL_COMMUNITY): Payer: Self-pay | Admitting: Internal Medicine

## 2019-09-02 DIAGNOSIS — E1165 Type 2 diabetes mellitus with hyperglycemia: Secondary | ICD-10-CM | POA: Diagnosis not present

## 2019-09-02 DIAGNOSIS — M2550 Pain in unspecified joint: Secondary | ICD-10-CM | POA: Diagnosis not present

## 2019-09-02 DIAGNOSIS — K627 Radiation proctitis: Secondary | ICD-10-CM | POA: Diagnosis not present

## 2019-09-02 DIAGNOSIS — I5022 Chronic systolic (congestive) heart failure: Secondary | ICD-10-CM | POA: Diagnosis present

## 2019-09-02 DIAGNOSIS — Z931 Gastrostomy status: Secondary | ICD-10-CM | POA: Diagnosis not present

## 2019-09-02 DIAGNOSIS — I2510 Atherosclerotic heart disease of native coronary artery without angina pectoris: Secondary | ICD-10-CM | POA: Diagnosis present

## 2019-09-02 DIAGNOSIS — Z8249 Family history of ischemic heart disease and other diseases of the circulatory system: Secondary | ICD-10-CM | POA: Diagnosis not present

## 2019-09-02 DIAGNOSIS — I69391 Dysphagia following cerebral infarction: Secondary | ICD-10-CM | POA: Diagnosis not present

## 2019-09-02 DIAGNOSIS — N183 Chronic kidney disease, stage 3 unspecified: Secondary | ICD-10-CM | POA: Diagnosis not present

## 2019-09-02 DIAGNOSIS — Z7902 Long term (current) use of antithrombotics/antiplatelets: Secondary | ICD-10-CM | POA: Diagnosis not present

## 2019-09-02 DIAGNOSIS — I517 Cardiomegaly: Secondary | ICD-10-CM | POA: Diagnosis not present

## 2019-09-02 DIAGNOSIS — R069 Unspecified abnormalities of breathing: Secondary | ICD-10-CM | POA: Diagnosis not present

## 2019-09-02 DIAGNOSIS — D631 Anemia in chronic kidney disease: Secondary | ICD-10-CM | POA: Diagnosis present

## 2019-09-02 DIAGNOSIS — E162 Hypoglycemia, unspecified: Secondary | ICD-10-CM | POA: Diagnosis present

## 2019-09-02 DIAGNOSIS — E872 Acidosis: Secondary | ICD-10-CM | POA: Diagnosis present

## 2019-09-02 DIAGNOSIS — J189 Pneumonia, unspecified organism: Secondary | ICD-10-CM | POA: Diagnosis not present

## 2019-09-02 DIAGNOSIS — K219 Gastro-esophageal reflux disease without esophagitis: Secondary | ICD-10-CM | POA: Diagnosis present

## 2019-09-02 DIAGNOSIS — N1832 Chronic kidney disease, stage 3b: Secondary | ICD-10-CM | POA: Diagnosis present

## 2019-09-02 DIAGNOSIS — I509 Heart failure, unspecified: Secondary | ICD-10-CM | POA: Diagnosis not present

## 2019-09-02 DIAGNOSIS — R21 Rash and other nonspecific skin eruption: Secondary | ICD-10-CM | POA: Diagnosis not present

## 2019-09-02 DIAGNOSIS — R58 Hemorrhage, not elsewhere classified: Secondary | ICD-10-CM | POA: Diagnosis not present

## 2019-09-02 DIAGNOSIS — R05 Cough: Secondary | ICD-10-CM | POA: Diagnosis not present

## 2019-09-02 DIAGNOSIS — I4891 Unspecified atrial fibrillation: Secondary | ICD-10-CM | POA: Diagnosis not present

## 2019-09-02 DIAGNOSIS — Z8546 Personal history of malignant neoplasm of prostate: Secondary | ICD-10-CM | POA: Diagnosis not present

## 2019-09-02 DIAGNOSIS — I480 Paroxysmal atrial fibrillation: Secondary | ICD-10-CM

## 2019-09-02 DIAGNOSIS — Z95820 Peripheral vascular angioplasty status with implants and grafts: Secondary | ICD-10-CM | POA: Diagnosis not present

## 2019-09-02 DIAGNOSIS — K5520 Angiodysplasia of colon without hemorrhage: Secondary | ICD-10-CM | POA: Diagnosis not present

## 2019-09-02 DIAGNOSIS — Z8673 Personal history of transient ischemic attack (TIA), and cerebral infarction without residual deficits: Secondary | ICD-10-CM | POA: Diagnosis not present

## 2019-09-02 DIAGNOSIS — E785 Hyperlipidemia, unspecified: Secondary | ICD-10-CM | POA: Diagnosis present

## 2019-09-02 DIAGNOSIS — J9811 Atelectasis: Secondary | ICD-10-CM | POA: Diagnosis not present

## 2019-09-02 DIAGNOSIS — I130 Hypertensive heart and chronic kidney disease with heart failure and stage 1 through stage 4 chronic kidney disease, or unspecified chronic kidney disease: Secondary | ICD-10-CM | POA: Diagnosis not present

## 2019-09-02 DIAGNOSIS — Z20822 Contact with and (suspected) exposure to covid-19: Secondary | ICD-10-CM | POA: Diagnosis present

## 2019-09-02 DIAGNOSIS — I420 Dilated cardiomyopathy: Secondary | ICD-10-CM | POA: Diagnosis present

## 2019-09-02 DIAGNOSIS — R54 Age-related physical debility: Secondary | ICD-10-CM | POA: Diagnosis present

## 2019-09-02 DIAGNOSIS — I10 Essential (primary) hypertension: Secondary | ICD-10-CM | POA: Diagnosis not present

## 2019-09-02 DIAGNOSIS — Z7401 Bed confinement status: Secondary | ICD-10-CM | POA: Diagnosis not present

## 2019-09-02 DIAGNOSIS — I255 Ischemic cardiomyopathy: Secondary | ICD-10-CM | POA: Diagnosis present

## 2019-09-02 DIAGNOSIS — I700 Atherosclerosis of aorta: Secondary | ICD-10-CM | POA: Diagnosis not present

## 2019-09-02 DIAGNOSIS — Z79899 Other long term (current) drug therapy: Secondary | ICD-10-CM | POA: Diagnosis not present

## 2019-09-02 DIAGNOSIS — Y842 Radiological procedure and radiotherapy as the cause of abnormal reaction of the patient, or of later complication, without mention of misadventure at the time of the procedure: Secondary | ICD-10-CM | POA: Diagnosis present

## 2019-09-02 DIAGNOSIS — K922 Gastrointestinal hemorrhage, unspecified: Secondary | ICD-10-CM | POA: Diagnosis not present

## 2019-09-02 DIAGNOSIS — R066 Hiccough: Secondary | ICD-10-CM | POA: Diagnosis not present

## 2019-09-02 DIAGNOSIS — K5731 Diverticulosis of large intestine without perforation or abscess with bleeding: Secondary | ICD-10-CM | POA: Diagnosis present

## 2019-09-02 DIAGNOSIS — R1312 Dysphagia, oropharyngeal phase: Secondary | ICD-10-CM | POA: Diagnosis present

## 2019-09-02 DIAGNOSIS — K921 Melena: Secondary | ICD-10-CM | POA: Diagnosis not present

## 2019-09-02 LAB — COMPREHENSIVE METABOLIC PANEL
ALT: 63 U/L — ABNORMAL HIGH (ref 0–44)
AST: 36 U/L (ref 15–41)
Albumin: 3.4 g/dL — ABNORMAL LOW (ref 3.5–5.0)
Alkaline Phosphatase: 122 U/L (ref 38–126)
Anion gap: 9 (ref 5–15)
BUN: 42 mg/dL — ABNORMAL HIGH (ref 8–23)
CO2: 29 mmol/L (ref 22–32)
Calcium: 9.4 mg/dL (ref 8.9–10.3)
Chloride: 105 mmol/L (ref 98–111)
Creatinine, Ser: 1.56 mg/dL — ABNORMAL HIGH (ref 0.61–1.24)
GFR calc Af Amer: 49 mL/min — ABNORMAL LOW (ref >60)
GFR calc non Af Amer: 42 mL/min — ABNORMAL LOW (ref >60)
Glucose, Bld: 101 mg/dL — ABNORMAL HIGH (ref 70–99)
Potassium: 4.6 mmol/L (ref 3.5–5.1)
Sodium: 143 mmol/L (ref 135–145)
Total Bilirubin: 0.7 mg/dL (ref 0.3–1.2)
Total Protein: 7.3 g/dL (ref 6.5–8.1)

## 2019-09-02 LAB — BASIC METABOLIC PANEL
Anion gap: 9 (ref 5–15)
BUN: 42 mg/dL — ABNORMAL HIGH (ref 8–23)
CO2: 26 mmol/L (ref 22–32)
Calcium: 9.2 mg/dL (ref 8.9–10.3)
Chloride: 107 mmol/L (ref 98–111)
Creatinine, Ser: 1.41 mg/dL — ABNORMAL HIGH (ref 0.61–1.24)
GFR calc Af Amer: 55 mL/min — ABNORMAL LOW (ref >60)
GFR calc non Af Amer: 47 mL/min — ABNORMAL LOW (ref >60)
Glucose, Bld: 105 mg/dL — ABNORMAL HIGH (ref 70–99)
Potassium: 4.4 mmol/L (ref 3.5–5.1)
Sodium: 142 mmol/L (ref 135–145)

## 2019-09-02 LAB — CBC
HCT: 34.6 % — ABNORMAL LOW (ref 39.0–52.0)
HCT: 33.1 % — ABNORMAL LOW (ref 39.0–52.0)
HCT: 33.4 % — ABNORMAL LOW (ref 39.0–52.0)
Hemoglobin: 10.7 g/dL — ABNORMAL LOW (ref 13.0–17.0)
Hemoglobin: 10.2 g/dL — ABNORMAL LOW (ref 13.0–17.0)
Hemoglobin: 10.4 g/dL — ABNORMAL LOW (ref 13.0–17.0)
MCH: 30.3 pg (ref 26.0–34.0)
MCH: 29.9 pg (ref 26.0–34.0)
MCH: 30.1 pg (ref 26.0–34.0)
MCHC: 30.9 g/dL (ref 30.0–36.0)
MCHC: 30.8 g/dL (ref 30.0–36.0)
MCHC: 31.1 g/dL (ref 30.0–36.0)
MCV: 98 fL (ref 80.0–100.0)
MCV: 97.1 fL (ref 80.0–100.0)
MCV: 96.8 fL (ref 80.0–100.0)
Platelets: 405 10*3/uL — ABNORMAL HIGH (ref 150–400)
Platelets: 418 10*3/uL — ABNORMAL HIGH (ref 150–400)
Platelets: 401 10*3/uL — ABNORMAL HIGH (ref 150–400)
RBC: 3.53 MIL/uL — ABNORMAL LOW (ref 4.22–5.81)
RBC: 3.41 MIL/uL — ABNORMAL LOW (ref 4.22–5.81)
RBC: 3.45 MIL/uL — ABNORMAL LOW (ref 4.22–5.81)
RDW: 17.8 % — ABNORMAL HIGH (ref 11.5–15.5)
RDW: 17.6 % — ABNORMAL HIGH (ref 11.5–15.5)
RDW: 17.7 % — ABNORMAL HIGH (ref 11.5–15.5)
WBC: 7.7 10*3/uL (ref 4.0–10.5)
WBC: 8.2 10*3/uL (ref 4.0–10.5)
WBC: 7 10*3/uL (ref 4.0–10.5)
nRBC: 0 % (ref 0.0–0.2)
nRBC: 0 % (ref 0.0–0.2)
nRBC: 0 % (ref 0.0–0.2)

## 2019-09-02 LAB — TYPE AND SCREEN
ABO/RH(D): B POS
Antibody Screen: NEGATIVE

## 2019-09-02 LAB — GLUCOSE, CAPILLARY
Glucose-Capillary: 100 mg/dL — ABNORMAL HIGH (ref 70–99)
Glucose-Capillary: 101 mg/dL — ABNORMAL HIGH (ref 70–99)
Glucose-Capillary: 96 mg/dL (ref 70–99)

## 2019-09-02 LAB — ABO/RH: ABO/RH(D): B POS

## 2019-09-02 LAB — SARS CORONAVIRUS 2 BY RT PCR (HOSPITAL ORDER, PERFORMED IN ~~LOC~~ HOSPITAL LAB): SARS Coronavirus 2: NEGATIVE

## 2019-09-02 MED ORDER — SODIUM CHLORIDE 0.45 % IV SOLN: INTRAVENOUS | Status: AC

## 2019-09-02 MED ORDER — PANTOPRAZOLE SODIUM 40 MG PO TBEC: 40.0000 mg | DELAYED_RELEASE_TABLET | Freq: Every day | ORAL | Status: DC

## 2019-09-02 MED ORDER — PANTOPRAZOLE SODIUM 40 MG PO PACK
40.0000 mg | PACK | Freq: Every day | ORAL | Status: DC
  Administered 2019-09-02 – 2019-09-08 (×5): 40 mg
  Filled 2019-09-02 (×7): qty 20

## 2019-09-02 MED ORDER — HYDRALAZINE HCL 20 MG/ML IJ SOLN: 10.0000 mg | INTRAMUSCULAR | Status: DC | PRN

## 2019-09-02 MED ORDER — PEG 3350-KCL-NA BICARB-NACL 420 G PO SOLR
2000.0000 mL | Freq: Once | ORAL | Status: AC
  Administered 2019-09-02: 2000 mL via ORAL

## 2019-09-02 MED ORDER — ACETAMINOPHEN 325 MG PO TABS
650.0000 mg | ORAL_TABLET | Freq: Four times a day (QID) | ORAL | Status: DC | PRN
  Administered 2019-09-05 – 2019-09-08 (×4): 650 mg
  Filled 2019-09-02 (×5): qty 2

## 2019-09-02 MED ORDER — CLOPIDOGREL BISULFATE 75 MG PO TABS
75.0000 mg | ORAL_TABLET | Freq: Every day | ORAL | Status: DC
  Administered 2019-09-03 – 2019-09-08 (×4): 75 mg
  Filled 2019-09-02 (×4): qty 1

## 2019-09-02 MED ORDER — METOPROLOL TARTRATE 5 MG/5ML IV SOLN
2.5000 mg | Freq: Four times a day (QID) | INTRAVENOUS | Status: DC
  Administered 2019-09-02 – 2019-09-08 (×26): 2.5 mg via INTRAVENOUS
  Filled 2019-09-02 (×25): qty 5

## 2019-09-02 MED ORDER — CHLORHEXIDINE GLUCONATE 0.12 % MT SOLN
15.0000 mL | Freq: Two times a day (BID) | OROMUCOSAL | Status: DC
  Administered 2019-09-02 – 2019-09-08 (×8): 15 mL via OROMUCOSAL
  Filled 2019-09-02 (×8): qty 15

## 2019-09-02 MED ORDER — METOCLOPRAMIDE HCL 5 MG/ML IJ SOLN
5.0000 mg | Freq: Once | INTRAMUSCULAR | Status: AC
  Administered 2019-09-02: 5 mg via INTRAVENOUS
  Filled 2019-09-02: qty 2

## 2019-09-02 MED ORDER — PRO-STAT SUGAR FREE PO LIQD
30.0000 mL | Freq: Two times a day (BID) | ORAL | Status: DC
  Administered 2019-09-03 – 2019-09-05 (×3): 30 mL via ORAL
  Filled 2019-09-02 (×4): qty 30

## 2019-09-02 MED ORDER — JEVITY 1.5 CAL/FIBER PO LIQD: 1000.0000 mL | ORAL | Status: DC

## 2019-09-02 MED ORDER — ORAL CARE MOUTH RINSE
15.0000 mL | Freq: Two times a day (BID) | OROMUCOSAL | Status: DC
  Administered 2019-09-03 – 2019-09-07 (×7): 15 mL via OROMUCOSAL

## 2019-09-02 MED ORDER — JEVITY 1.5 CAL/FIBER PO LIQD
1000.0000 mL | ORAL | Status: DC
  Filled 2019-09-02: qty 1000

## 2019-09-02 NOTE — Consult Note (Addendum)
[facility] Consult: 10:47 AM 09/02/2019  LOS: 0 days    Referring Provider: Dr Sloan Leiter  Primary Care Physician:  Stephen Copeland., MD [facility] Primary Gastroenterologist:  Stephen Copeland [name]       Reason for Consultation:  Rectal bleeding   HPI: Stephen Copeland is a 79 y.o. male.  PMH ischemic CM, EF 35 - 40% as of 07/2019.  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  Prostate cancer gleason8.  S/p TURP, cystoscopy w fulguration prostate 2019,  CKD 3.   AKI 03/2017.  S/p bil inguinal hernia repair.  Anemia requiring transfusions in 2019.  Choledocholithiasis w normal CBD, cholelithiasis, simple sigmoid diverticulitis, normal liver per CTAP of 05/2016 and 03/2017 PAF on Brilinta.  Anemia chronic dz, Hgb 9.2 in 03/2017.  EGD > 5 yr ago at [place]: "ulcer at lesser curvature of duodenum", pt's words.  Chronic PPI.    No previous colonoscopy.   5/6 - 6/23 admission w acute stroke. SAH.  S/p clot retrieval, angioplasty and stent placement to R ICA.  Dysphagia w asp PNA required vent support.  AFib w RVR    Cortrack FT in 07/2019.   08/04/2019 EGD by Dr. Laqueta Copeland.  For dysphagia.  Normal study. 6/1 and 08/13/19 Perc gastrostomy tube placement per IR.    08/18/19 KUB shows extensive colon diverticulosis.    Limited BPR with diarrhea, not investigated during admission. t bili 2, alk phos 91, AST/ALT 106/97 on 5/30.     Wt of 77.1 kg at discharge.  Discharge on Plavix and Protonix 40 mg/day.  PTA Eliquis discontinued. Also was cleared for D 1 purre w honey thicks at time of discharge.    Recovering fairly well.  Standing but not walking w assistance.  Tolerating solids in addition to g tube feedings.    Brought to ED from SNF w rectal bleeding which was first noted yesterday, has had episodes since arrival in the ED and this morning.  Stool is  soft/loose and admixed with blood.  Pt denies nausea, vomiting, abdominal or rectal pain.  Prior to recent admission had never had rectal bleeding even when he was taking Eliquis.  Still has a sense that food does not completely empty from his mouth but no sense of esophageal level dysphagia or discomfort with swallowing.  Hgb 11.5 >> 10.4, was 9 a week ago.  Platelets, MCV normal  Slight AKI 42/1.5, was 27/1.4 last week.   ALT 63, Albumin 3.4, o/w normal LFTs.   Last Plavix: 6/29   Past Medical History:  Diagnosis Date  . Acid reflux   . CHF (congestive heart failure) ([place])   . Coronary artery disease   . Dilated cardiomyopathy ([place])   . Folliculitis   . Gastric ulcer   . Hiatal hernia   . Hyperlipidemia    a. pt is adamantly against statins.  . Hypertension   . Ischemia    a. Pt states he was diagnosed with "ischemia" in the 1990s but does not know further details, denies hx of heart blockage.  . Nummular dermatitis   .  Prostate cancer ([place])   . Renal disorder   . Scoliosis   . Sinus bradycardia   . Stomach ulcer    a. remote ulcer in the 1990s, no hx of bleeding.    Past Surgical History:  Procedure Laterality Date  . ESOPHAGOGASTRODUODENOSCOPY (EGD) WITH PROPOFOL N/A 08/04/2019   Procedure: ESOPHAGOGASTRODUODENOSCOPY (EGD) WITH PROPOFOL;  Surgeon: Jesusita Oka, MD;  [facility];  Service: General;  Laterality: N/A;  . INGUINAL HERNIA REPAIR Left   . IR ANGIO INTRA EXTRACRAN SEL COM CAROTID INNOMINATE UNI L MOD SED  07/10/2019  . IR ANGIO VERTEBRAL SEL SUBCLAVIAN INNOMINATE UNI R MOD SED  07/10/2019  . IR CT HEAD LTD  07/10/2019  . IR INTRAVSC STENT CERV CAROTID W/O EMB-PROT MOD SED INC ANGIO  07/10/2019  . IR PERCUTANEOUS ART THROMBECTOMY/INFUSION INTRACRANIAL INC DIAG ANGIO  07/10/2019  . IR REPLC GASTRO/COLONIC TUBE PERCUT W/FLUORO  08/13/2019  . PEG PLACEMENT N/A 08/04/2019   Procedure: PERCUTANEOUS ENDOSCOPIC GASTROSTOMY (PEG) PLACEMENT;  Surgeon: Jesusita Oka,  MD;  Location: [place];  Service: General;  Laterality: N/A;  . RADIOLOGY WITH ANESTHESIA N/A 07/09/2019   Procedure: IR WITH ANESTHESIA;  Surgeon: Luanne Bras, MD;  Location: [place];  Service: Radiology;  Laterality: N/A;    Prior to Admission medications   Medication Sig Start Date End Date Taking? Authorizing Provider  acetaminophen (TYLENOL) 325 MG tablet Take 650 mg by mouth every 6 (six) hours as needed for mild pain.   Yes [provider]  Amino Acids-Protein Hydrolys (FEEDING SUPPLEMENT, PRO-STAT SUGAR FREE 64,) LIQD Place 30 mLs into feeding tube 2 (two) times daily. 08/26/19  Yes Donzetta Starch, NP  clopidogrel (PLAVIX) 75 MG tablet Take 75 mg by mouth in the morning and at bedtime.   Yes [provider]  feeding supplement, ENSURE ENLIVE, (ENSURE ENLIVE) LIQD Take 237 mLs by mouth 2 (two) times daily between meals. 08/26/19  Yes Donzetta Starch, NP  hydrALAZINE (APRESOLINE) 25 MG tablet Place 1 tablet (25 mg total) into feeding tube every 8 (eight) hours. 08/26/19  Yes Donzetta Starch, NP  lidocaine (XYLOCAINE) 5 % ointment Apply 1 application topically 2 (two) times daily as needed (pain). 08/26/19  Yes Donzetta Starch, NP  Maltodextrin-Xanthan Gum (RESOURCE THICKENUP CLEAR) POWD Take 120 g by mouth as needed (for honey thick liquid consistency). 08/26/19  Yes Donzetta Starch, NP  metoprolol tartrate (LOPRESSOR) 25 mg/10 mL SUSP Place 10 mLs (25 mg total) into feeding tube 2 (two) times daily. 08/26/19  Yes Donzetta Starch, NP  Nystatin (GERHARDT'S BUTT CREAM) CREA Apply 1 application topically every 8 (eight) hours as needed for irritation. 08/26/19  Yes Donzetta Starch, NP  pantoprazole sodium (PROTONIX) 40 mg/20 mL PACK Place 20 mLs (40 mg total) into feeding tube daily. 08/26/19  Yes Donzetta Starch, NP  Nutritional Supplements (FEEDING SUPPLEMENT, JEVITY 1.5 CAL/FIBER,) LIQD Place 1,000 mLs into feeding tube continuous. 08/26/19   Donzetta Starch, NP  ticagrelor  (BRILINTA) 90 MG TABS tablet Place 1 tablet (90 mg total) into feeding tube 2 (two) times daily. Patient not taking: Reported on 09/02/2019 08/26/19   Donzetta Starch, NP  Water For Irrigation, Sterile (FREE WATER) SOLN Place 200 mLs into feeding tube every 4 (four) hours. 08/26/19   Donzetta Starch, NP    Scheduled Meds: . clopidogrel  75 mg Per Tube Daily  . metoprolol tartrate  2.5 mg Intravenous Q6H   Infusions:  PRN Meds: acetaminophen, hydrALAZINE   Allergies as of 09/01/2019  . (No Known Allergies)    Family History  Problem Relation Age of Onset  . Heart disease Mother        Further details not reported, died at age 28  . Valvular heart disease Father        H/o MV surgery, diet at age 69    Social History   Socioeconomic History  . Marital status: Married    Spouse name: Not on file  . Number of children: Not on file  . Years of education: Not on file  . Highest education level: Not on file  Occupational History  . Occupation: retired    Fish farm manager: [employer]  Tobacco Use  . Smoking status: Never Smoker  . Smokeless tobacco: Never Used  Vaping Use  . Vaping Use: Never used  Substance and Sexual Activity  . Alcohol use: No  . Drug use: No  . Sexual activity: Not on file  Other Topics Concern  . Not on file  Social History Narrative  . Not on file   Social Determinants of Health   Financial Resource Strain:   . Difficulty of Paying Living Expenses:   Food Insecurity:   . Worried About Charity fundraiser in the Last Year:   . Arboriculturist in the Last Year:   Transportation Needs:   . Film/video editor (Medical):   Marland Kitchen Lack of Transportation (Non-Medical):   Physical Activity:   . Days of Exercise per Week:   . Minutes of Exercise per Session:   Stress:   . Feeling of Stress :   Social Connections:   . Frequency of Communication with Friends and Family:   . Frequency of Social Gatherings with Friends and Family:   . Attends Religious Services:    . Active Member of Clubs or Organizations:   . Attends Archivist Meetings:   Marland Kitchen Marital Status:   Intimate Partner Violence:   . Fear of Current or Ex-Partner:   . Emotionally Abused:   Marland Kitchen Physically Abused:   . Sexually Abused:     REVIEW OF SYSTEMS: Constitutional: Weakness which is improving. ENT:  No nose bleeds Pulm: Occasional cough.  Does not feel short of breath CV:  No palpitations, no LE edema.  No chest pain GU:  No hematuria, no frequency GI: See HPI. Heme: No excessive or unusual bleeding or bruising. Transfusions: See HPI. Neuro:  No headaches, no peripheral tingling or numbness Derm:  No itching, no rash or sores.  Endocrine:  No sweats or chills.  No polyuria or dysuria Immunization: Has not received Covid vaccination, not interested in receiving Covid vaccination. Travel:  None beyond local counties in last few months.    PHYSICAL EXAM: Vital signs in last 24 hours: Vitals:   09/02/19 0257 09/02/19 0600  BP: 136/76 (!) 144/88  Pulse: 88   Resp: 16   Temp: 98.1 F (36.7 C)   SpO2: 98%    Wt Readings from Last 3 Encounters:  09/02/19 73.9 kg  08/25/19 77.1 kg  04/14/19 84.6 kg    General: Elderly, somewhat frail but alert, comfortable, not acutely ill-appearing WM Head: No facial asymmetry or swelling.  No signs of head trauma. Eyes: No scleral icterus.  No conjunctival pallor.  EOMI. Ears: Slight HOH Nose: No congestion or discharge Mouth: Tongue midline.  Mucosa moist.  Good dentition. Neck: No JVD, no masses, no thyromegaly Lungs: Diminished breath  sounds on the right but overall clear without labored breathing or cough Heart: RRR.  No MRG.  S1, S2 present Abdomen: Soft.  Not tender, not distended.  G-tube positioned in right upper quadrant with ostomy site benign, no leakage, no bleeding, no erythema..   Rectal: No visible or palpable masses.  Stool is soft and reddish/bloody/medium brown.  Tests rapidly FOBT positive Musc/Skeltl:  No joint redness or swelling.  Thin upper and lower limbs Extremities: No CCE Neurologic: Alert.  Good historian.  Speech is a bit slow/halting but clear.  Moves all 4 limbs with grossly normal strength.  No tremors. Skin: No telangiectasia, rashes, sores Tattoos: None observed Nodes: No cervical adenopathy Psych: Cooperative, animated, pleasant  Intake/Output from previous day: 06/29 0701 - 06/30 0700 In: 250 [IV Piggyback:250] Out: -  Intake/Output this shift: No intake/output data recorded.  LAB RESULTS: Recent Labs    09/01/19 2325 09/02/19 0331 09/02/19 0644  WBC 9.5 7.7 8.2  HGB 11.5* 10.7* 10.2*  HCT 37.0* 34.6* 33.1*  PLT 450* 405* 418*   BMET Lab Results  Component Value Date   NA 142 09/02/2019   NA 143 09/01/2019   NA 135 08/25/2019   K 4.4 09/02/2019   K 4.6 09/01/2019   K 4.3 08/25/2019   CL 107 09/02/2019   CL 105 09/01/2019   CL 99 08/25/2019   CO2 26 09/02/2019   CO2 29 09/01/2019   CO2 26 08/25/2019   GLUCOSE 105 (H) 09/02/2019   GLUCOSE 101 (H) 09/01/2019   GLUCOSE 135 (H) 08/25/2019   BUN 42 (H) 09/02/2019   BUN 42 (H) 09/01/2019   BUN 27 (H) 08/25/2019   CREATININE 1.41 (H) 09/02/2019   CREATININE 1.56 (H) 09/01/2019   CREATININE 1.43 (H) 08/25/2019   CALCIUM 9.2 09/02/2019   CALCIUM 9.4 09/01/2019   CALCIUM 8.7 (L) 08/25/2019   LFT Recent Labs    09/01/19 2325  PROT 7.3  ALBUMIN 3.4*  AST 36  ALT 63*  ALKPHOS 122  BILITOT 0.7   PT/INR Lab Results  Component Value Date   INR 1.4 (H) 07/09/2019   INR 1.9 (A) 04/29/2018   INR 2.4 04/08/2018   Hepatitis Panel No results for input(s): HEPBSAG, HCVAB, HEPAIGM, HEPBIGM in the last 72 hours. C-Diff No components found for: CDIFF Lipase     Component Value Date/Time   LIPASE 39 08/02/2019 1159    Drugs of Abuse     Component Value Date/Time   LABOPIA NONE DETECTED 07/10/2019 0121   COCAINSCRNUR NONE DETECTED 07/10/2019 0121   LABBENZ NONE DETECTED 07/10/2019 0121    AMPHETMU NONE DETECTED 07/10/2019 0121   THCU NONE DETECTED 07/10/2019 0121   LABBARB NONE DETECTED 07/10/2019 0121     RADIOLOGY STUDIES: No results found.   IMPRESSION:   *   Bleeding PR, bloody stools reported during recent admission.   EGD 08/04/19 for dysphagia, normal study No records of previous colonoscopy.   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  Has never had colonoscopy so need to rule out neoplasia, vascular malformations etc.  *   Recent CVA with intervention, stent to R ICA.  Plavix not on hold, last dose was 6/29, yesterday.    *    Neurogenic dysphagia following CVA.  Gastric feeding tube placed by IR.  In the last few days patient is been taking  solid p.o.'s at SNF.  Currently n.p.o.  *   Afib, previous Eliquis, stopped during recent admission  *   Anemia.  Hg down in last several hours, overall improved from recent admission.  Hx anemia chronic dz and transfusions in 2019  *    Asymptomatic choledocholithiasis dates back and seen on imaging 2018 and 2019.  Currently mild elevation in ALT, but LFTs overall improved compared with levels during recent admission.  *    CHF.  EF 35 to 40% per echo of 07/10/2019  *   Remote history gastric versus duodenal ulcer.  On outpatient Protonix, not currently continued though did receive 1 dose of IV Protonix last night   PLAN:     *  Colonoscopy?  Dr Tarri Glenn will folllow pt.   *   Resume TF of orders (Jevity 1.5 at 50 ml/hour, prostat 30 mL bid, 200 ml free water q 4 hours were outlined by RD on 6/22) and  Protonix via tube.    *   Have formal speech eval to assess what he can take in terms of po.    1330 addendum: swallow "lookd terrible" at prelim SLP eval, so needs NPO.       Azucena Freed  09/02/2019, 10:47 AM Phone [phone]

## 2019-09-02 NOTE — Progress Notes (Signed)
Initial Nutrition Assessment  INTERVENTION:   -When able, resume Jevity 1.5 @ 50 ml/hr via PEG -30 ml Prostat BID via tube -200 ml free water flushes every 4 hours  NUTRITION DIAGNOSIS:   Inadequate oral intake related to dysphagia (s/p stroke) as evidenced by NPO status.  GOAL:   Patient will meet greater than or equal to 90% of their needs  MONITOR:   Labs, Weight trends, TF tolerance, I & O's  REASON FOR ASSESSMENT:   Malnutrition Screening Tool    ASSESSMENT:   79 y.o. male with history of A. fib cardiomyopathy hypertension previous history of prostate cancer who was recently admitted and discharged last week after having a complicated course after patient was diagnosed with stroke underwent intervention stent placement for stroke and complicated with intracranial hemorrhage and respiratory failure requiring intubation secondary to aspiration pneumonia and during the stay patient also was noted to have bright red blood per rectum for which patient's Eliquis had to be held off and on eventually discharged with no Eliquis but Brilinta presently on Plavix for the stent was noticed to have some bleeding per rectum at the living facility..  Denies any nausea vomiting.  Patient has a PEG tube secondary to dysphagia.  Patient states over the last 3 days been eating solid food.  Attempted to see patient today but GI seeing pt in room at time of visit. Will attempt again at a later date.   Per chart review, pt was just recently discharged from [facility] on 6/23. Discharged on PEG tube feeds of Jevity 1.5 @ 50 ml/hr with 30 ml Prostat BID and free water flushes of 200 ml every 4 hours. Pt was also permitted to eat a dysphagia 1 diet.   Per SLP note, pt to remain NPO at this time. Recommends MBS.  Per GI note, pt to remain NPO, TF off d/t need for  colonoscopy.  Per weight records, pt has lost 23 lbs since February 2021 (12% wt loss x 4.5 months, significant for time frame).  Medication:IV  Reglan Labs reviewed: CBG: 101  NUTRITION - FOCUSED PHYSICAL EXAM:  Unable to complete, will attempt at follow-up  Diet Order:   Diet Order            Diet NPO time specified Except for: Sips with Meds  Diet effective now                 EDUCATION NEEDS:   No education needs have been identified at this time  Skin:  Skin Assessment: Reviewed RN Assessment  Last BM:  6/30 -type 5  Height:   Ht Readings from Last 1 Encounters:  09/02/19 6\' 2"  (1.88 m)    Weight:   Wt Readings from Last 1 Encounters:  09/02/19 73.9 kg   BMI:  Body mass index is 20.93 kg/m.  Estimated Nutritional Needs:   Kcal:  2100-2300  Protein:  100-115g  Fluid:  2L/day  Clayton Bibles, MS, RD, LDN Inpatient Clinical Dietitian Contact information available via Amion

## 2019-09-02 NOTE — Evaluation (Signed)
Clinical/Bedside Swallow Evaluation Patient Details  Name: Stephen Copeland MRN: [id number] Date of Birth: September 07, 1940  Today's Date: 09/02/2019 Time: SLP Start Time (ACUTE ONLY): 1259 SLP Stop Time (ACUTE ONLY): 1322 SLP Time Calculation (min) (ACUTE ONLY): 23 min  Past Medical History:  Past Medical History:  Diagnosis Date  . Acid reflux   . CHF (congestive heart failure) ([place])   . Coronary artery disease   . Dilated cardiomyopathy ([place])   . Folliculitis   . Gastric ulcer   . Hiatal hernia   . Hyperlipidemia    a. pt is adamantly against statins.  . Hypertension   . Ischemia    a. Pt states he was diagnosed with "ischemia" in the 1990s but does not know further details, denies hx of heart blockage.  . Nummular dermatitis   . Prostate cancer ([place])   . Renal disorder   . Scoliosis   . Sinus bradycardia   . Stomach ulcer    a. remote ulcer in the 1990s, no hx of bleeding.   Past Surgical History:  Past Surgical History:  Procedure Laterality Date  . ESOPHAGOGASTRODUODENOSCOPY (EGD) WITH PROPOFOL N/A 08/04/2019   Procedure: ESOPHAGOGASTRODUODENOSCOPY (EGD) WITH PROPOFOL;  Surgeon: Jesusita Oka, MD;  [facility];  Service: General;  Laterality: N/A;  . INGUINAL HERNIA REPAIR Left   . IR ANGIO INTRA EXTRACRAN SEL COM CAROTID INNOMINATE UNI L MOD SED  07/10/2019  . IR ANGIO VERTEBRAL SEL SUBCLAVIAN INNOMINATE UNI R MOD SED  07/10/2019  . IR CT HEAD LTD  07/10/2019  . IR INTRAVSC STENT CERV CAROTID W/O EMB-PROT MOD SED INC ANGIO  07/10/2019  . IR PERCUTANEOUS ART THROMBECTOMY/INFUSION INTRACRANIAL INC DIAG ANGIO  07/10/2019  . IR REPLC GASTRO/COLONIC TUBE PERCUT W/FLUORO  08/13/2019  . PEG PLACEMENT N/A 08/04/2019   Procedure: PERCUTANEOUS ENDOSCOPIC GASTROSTOMY (PEG) PLACEMENT;  Surgeon: Jesusita Oka, MD;  Location: [place];  Service: General;  Laterality: N/A;  . RADIOLOGY WITH ANESTHESIA N/A 07/09/2019   Procedure: IR WITH ANESTHESIA;  Surgeon: Luanne Bras, MD;   Location: [place];  Service: Radiology;  Laterality: N/A;   HPI:  79 y.o. male with PMH of Afib, systolic CHF, HLD, CAD, HTN, GERD, prostate ca, and recent right MCA infarct 08/11/10, complicated by [facility] two days later, admitted to [facility] from rehab facility for rectal bleeding. Underwent initial clinical swallow evaluation 07/10/19, had a cortak, demonstrated multiple refusals to participate, refused attempts at [facility], and was followed by SLP until 6/7, at which time he was D/Cd from our service due to poor participation and progress.  PEG 6/1 and 6/10. Pt was on a dysphagia 1 diet with honey-thick liquids at time of D/C, but oral intake was minimal. Per notes, he has been tolerating solids in addition to g-tube feedings. GI is following; TF resumed; SLP ordered to assess if he can swallow POs.    Assessment / Plan / Recommendation Clinical Impression  Pt presents with ongoing s/s of dysphagia with concern for aspiration.  He demonstrated consistent coughing after trials of thin liquids, constant throat-clearing after swallows of puree. He reports sensation of material lodging in throat as well as backflow from esophagus.  Oral motor exam reveals improved function of cranial nerves on left side. While pt refused MBS during last admission, he is amenable to proceeding with study this time.  Recommend continuing NPO with nutrition via PEG.  Per notes and RN, there is potential for colonoscopy - will await direction and proceed with MBS when cleared  by GI. D/W pt, wife, and RN.  SLP Visit Diagnosis: Dysphagia, oropharyngeal phase (R13.12)    Aspiration Risk    tba   Diet Recommendation   npo  Medication Administration: Via alternative means    Other  Recommendations Oral Care Recommendations: Oral care QID   Follow up Recommendations          Swallow Study   General Date of Onset: 07/10/19 HPI: 79 y.o. male with PMH of Afib, systolic CHF, HLD, CAD, HTN, GERD, prostate ca, and recent right MCA infarct  08/09/27, complicated by [facility] two days later, admitted to [facility] from rehab facility for rectal bleeding. Underwent initial clinical swallow evaluation 07/10/19, had a cortak, demonstrated multiple refusals to participate, refused attempts at [facility], and was followed by SLP until 6/7, at which time he was D/Cd from our service due to poor participation and progress.  PEG 6/1 and 6/10. Pt was on a dysphagia 1 diet with honey-thick liquids at time of D/C, but oral intake was minimal. Per notes, he has been tolerating solids in addition to g-tube feedings. GI is following; TF resumed; SLP ordered to assess if he can swallow POs.  Type of Study: Bedside Swallow Evaluation Previous Swallow Assessment: see HPI Diet Prior to this Study: NPO Temperature Spikes Noted: No Respiratory Status: Room air History of Recent Intubation: No Behavior/Cognition: Alert;Impulsive Oral Cavity Assessment: Within Functional Limits Oral Care Completed by SLP: Recent completion by staff Oral Cavity - Dentition: Adequate natural dentition Vision: Functional for self-feeding Self-Feeding Abilities: Needs assist Patient Positioning: Upright in bed Baseline Vocal Quality: Hoarse Volitional Cough: Weak Volitional Swallow: Able to elicit    Oral/Motor/Sensory Function Overall Oral Motor/Sensory Function: Mild impairment Facial ROM: Reduced left;Suspected CN VII (facial) dysfunction Facial Symmetry: Abnormal symmetry left;Suspected CN VII (facial) dysfunction   Ice Chips Ice chips: Within functional limits   Thin Liquid Thin Liquid: Impaired Presentation: Cup Pharyngeal  Phase Impairments: Cough - Immediate    Nectar Thick Nectar Thick Liquid: Not tested   Honey Thick Honey Thick Liquid: Not tested   Puree Puree: Impaired Presentation: Spoon Pharyngeal Phase Impairments: Multiple swallows;Cough - Delayed   Solid     Solid: Not tested      Stephen Copeland 09/02/2019,1:23 PM  Estill Bamberg L. Tivis Ringer, [place] Office number [phone] Pager [phone]

## 2019-09-02 NOTE — H&P (View-Only) (Signed)
[facility] Consult: 10:47 AM 09/02/2019  LOS: 0 days    Referring Provider: Dr Sloan Leiter  Primary Care Physician:  [name]., MD [facility] Primary Gastroenterologist:  [name]       Reason for Consultation:  Rectal bleeding   HPI: Stephen Copeland is a 79 y.o. male.  PMH ischemic CM, EF 35 - 40% as of 07/2019.  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  Prostate cancer gleason8.  S/p TURP, cystoscopy w fulguration prostate 2019,  CKD 3.   AKI 03/2017.  S/p bil inguinal hernia repair.  Anemia requiring transfusions in 2019.  Choledocholithiasis w normal CBD, cholelithiasis, simple sigmoid diverticulitis, normal liver per CTAP of 05/2016 and 03/2017 PAF on Brilinta.  Anemia chronic dz, Hgb 9.2 in 03/2017.  EGD > 5 yr ago at [place]: "ulcer at lesser curvature of duodenum", pt's words.  Chronic PPI.    No previous colonoscopy.   5/6 - 6/23 admission w acute stroke. SAH.  S/p clot retrieval, angioplasty and stent placement to R ICA.  Dysphagia w asp PNA required vent support.  AFib w RVR    Cortrack FT in 07/2019.   08/04/2019 EGD by Dr. Laqueta Carina.  For dysphagia.  Normal study. 6/1 and 08/13/19 Perc gastrostomy tube placement per IR.    08/18/19 KUB shows extensive colon diverticulosis.    Limited BPR with diarrhea, not investigated during admission. t bili 2, alk phos 91, AST/ALT 106/97 on 5/30.     Wt of 77.1 kg at discharge.  Discharge on Plavix and Protonix 40 mg/day.  PTA Eliquis discontinued. Also was cleared for D 1 purre w honey thicks at time of discharge.    Recovering fairly well.  Standing but not walking w assistance.  Tolerating solids in addition to g tube feedings.    Brought to ED from SNF w rectal bleeding which was first noted yesterday, has had episodes since arrival in the ED and this morning.  Stool is  soft/loose and admixed with blood.  Pt denies nausea, vomiting, abdominal or rectal pain.  Prior to recent admission had never had rectal bleeding even when he was taking Eliquis.  Still has a sense that food does not completely empty from his mouth but no sense of esophageal level dysphagia or discomfort with swallowing.  Hgb 11.5 >> 10.4, was 9 a week ago.  Platelets, MCV normal  Slight AKI 42/1.5, was 27/1.4 last week.   ALT 63, Albumin 3.4, o/w normal LFTs.   Last Plavix: 6/29   Past Medical History:  Diagnosis Date  . Acid reflux   . CHF (congestive heart failure) ([place])   . Coronary artery disease   . Dilated cardiomyopathy ([place])   . Folliculitis   . Gastric ulcer   . Hiatal hernia   . Hyperlipidemia    a. pt is adamantly against statins.  . Hypertension   . Ischemia    a. Pt states he was diagnosed with "ischemia" in the 1990s but does not know further details, denies hx of heart blockage.  . Nummular dermatitis   .  Prostate cancer ([place])   . Renal disorder   . Scoliosis   . Sinus bradycardia   . Stomach ulcer    a. remote ulcer in the 1990s, no hx of bleeding.    Past Surgical History:  Procedure Laterality Date  . ESOPHAGOGASTRODUODENOSCOPY (EGD) WITH PROPOFOL N/A 08/04/2019   Procedure: ESOPHAGOGASTRODUODENOSCOPY (EGD) WITH PROPOFOL;  Surgeon: Jesusita Oka, MD;  Location: [facility];  Service: General;  Laterality: N/A;  . INGUINAL HERNIA REPAIR Left   . IR ANGIO INTRA EXTRACRAN SEL COM CAROTID INNOMINATE UNI L MOD SED  07/10/2019  . IR ANGIO VERTEBRAL SEL SUBCLAVIAN INNOMINATE UNI R MOD SED  07/10/2019  . IR CT HEAD LTD  07/10/2019  . IR INTRAVSC STENT CERV CAROTID W/O EMB-PROT MOD SED INC ANGIO  07/10/2019  . IR PERCUTANEOUS ART THROMBECTOMY/INFUSION INTRACRANIAL INC DIAG ANGIO  07/10/2019  . IR REPLC GASTRO/COLONIC TUBE PERCUT W/FLUORO  08/13/2019  . PEG PLACEMENT N/A 08/04/2019   Procedure: PERCUTANEOUS ENDOSCOPIC GASTROSTOMY (PEG) PLACEMENT;  Surgeon: Jesusita Oka,  MD;  Location: [place];  Service: General;  Laterality: N/A;  . RADIOLOGY WITH ANESTHESIA N/A 07/09/2019   Procedure: IR WITH ANESTHESIA;  Surgeon: Luanne Bras, MD;  Location: [place];  Service: Radiology;  Laterality: N/A;    Prior to Admission medications   Medication Sig Start Date End Date Taking? Authorizing Provider  acetaminophen (TYLENOL) 325 MG tablet Take 650 mg by mouth every 6 (six) hours as needed for mild pain.   Yes [provider]  Amino Acids-Protein Hydrolys (FEEDING SUPPLEMENT, PRO-STAT SUGAR FREE 64,) LIQD Place 30 mLs into feeding tube 2 (two) times daily. 08/26/19  Yes Donzetta Starch, NP  clopidogrel (PLAVIX) 75 MG tablet Take 75 mg by mouth in the morning and at bedtime.   Yes [provider]  feeding supplement, ENSURE ENLIVE, (ENSURE ENLIVE) LIQD Take 237 mLs by mouth 2 (two) times daily between meals. 08/26/19  Yes Donzetta Starch, NP  hydrALAZINE (APRESOLINE) 25 MG tablet Place 1 tablet (25 mg total) into feeding tube every 8 (eight) hours. 08/26/19  Yes Donzetta Starch, NP  lidocaine (XYLOCAINE) 5 % ointment Apply 1 application topically 2 (two) times daily as needed (pain). 08/26/19  Yes Donzetta Starch, NP  Maltodextrin-Xanthan Gum (RESOURCE THICKENUP CLEAR) POWD Take 120 g by mouth as needed (for honey thick liquid consistency). 08/26/19  Yes Donzetta Starch, NP  metoprolol tartrate (LOPRESSOR) 25 mg/10 mL SUSP Place 10 mLs (25 mg total) into feeding tube 2 (two) times daily. 08/26/19  Yes Donzetta Starch, NP  Nystatin (GERHARDT'S BUTT CREAM) CREA Apply 1 application topically every 8 (eight) hours as needed for irritation. 08/26/19  Yes Donzetta Starch, NP  pantoprazole sodium (PROTONIX) 40 mg/20 mL PACK Place 20 mLs (40 mg total) into feeding tube daily. 08/26/19  Yes Donzetta Starch, NP  Nutritional Supplements (FEEDING SUPPLEMENT, JEVITY 1.5 CAL/FIBER,) LIQD Place 1,000 mLs into feeding tube continuous. 08/26/19   Donzetta Starch, NP  ticagrelor  (BRILINTA) 90 MG TABS tablet Place 1 tablet (90 mg total) into feeding tube 2 (two) times daily. Patient not taking: Reported on 09/02/2019 08/26/19   Donzetta Starch, NP  Water For Irrigation, Sterile (FREE WATER) SOLN Place 200 mLs into feeding tube every 4 (four) hours. 08/26/19   Donzetta Starch, NP    Scheduled Meds: . clopidogrel  75 mg Per Tube Daily  . metoprolol tartrate  2.5 mg Intravenous Q6H   Infusions:  PRN Meds: acetaminophen, hydrALAZINE   Allergies as of 09/01/2019  . (No Known Allergies)    Family History  Problem Relation Age of Onset  . Heart disease Mother        Further details not reported, died at age 28  . Valvular heart disease Father        H/o MV surgery, diet at age 69    Social History   Socioeconomic History  . Marital status: Married    Spouse name: Not on file  . Number of children: Not on file  . Years of education: Not on file  . Highest education level: Not on file  Occupational History  . Occupation: retired    Fish farm manager: [employer]  Tobacco Use  . Smoking status: Never Smoker  . Smokeless tobacco: Never Used  Vaping Use  . Vaping Use: Never used  Substance and Sexual Activity  . Alcohol use: No  . Drug use: No  . Sexual activity: Not on file  Other Topics Concern  . Not on file  Social History Narrative  . Not on file   Social Determinants of Health   Financial Resource Strain:   . Difficulty of Paying Living Expenses:   Food Insecurity:   . Worried About Charity fundraiser in the Last Year:   . Arboriculturist in the Last Year:   Transportation Needs:   . Film/video editor (Medical):   Marland Kitchen Lack of Transportation (Non-Medical):   Physical Activity:   . Days of Exercise per Week:   . Minutes of Exercise per Session:   Stress:   . Feeling of Stress :   Social Connections:   . Frequency of Communication with Friends and Family:   . Frequency of Social Gatherings with Friends and Family:   . Attends Religious Services:    . Active Member of Clubs or Organizations:   . Attends Archivist Meetings:   Marland Kitchen Marital Status:   Intimate Partner Violence:   . Fear of Current or Ex-Partner:   . Emotionally Abused:   Marland Kitchen Physically Abused:   . Sexually Abused:     REVIEW OF SYSTEMS: Constitutional: Weakness which is improving. ENT:  No nose bleeds Pulm: Occasional cough.  Does not feel short of breath CV:  No palpitations, no LE edema.  No chest pain GU:  No hematuria, no frequency GI: See HPI. Heme: No excessive or unusual bleeding or bruising. Transfusions: See HPI. Neuro:  No headaches, no peripheral tingling or numbness Derm:  No itching, no rash or sores.  Endocrine:  No sweats or chills.  No polyuria or dysuria Immunization: Has not received Covid vaccination, not interested in receiving Covid vaccination. Travel:  None beyond local counties in last few months.    PHYSICAL EXAM: Vital signs in last 24 hours: Vitals:   09/02/19 0257 09/02/19 0600  BP: 136/76 (!) 144/88  Pulse: 88   Resp: 16   Temp: 98.1 F (36.7 C)   SpO2: 98%    Wt Readings from Last 3 Encounters:  09/02/19 73.9 kg  08/25/19 77.1 kg  04/14/19 84.6 kg    General: Elderly, somewhat frail but alert, comfortable, not acutely ill-appearing WM Head: No facial asymmetry or swelling.  No signs of head trauma. Eyes: No scleral icterus.  No conjunctival pallor.  EOMI. Ears: Slight HOH Nose: No congestion or discharge Mouth: Tongue midline.  Mucosa moist.  Good dentition. Neck: No JVD, no masses, no thyromegaly Lungs: Diminished breath  sounds on the right but overall clear without labored breathing or cough Heart: RRR.  No MRG.  S1, S2 present Abdomen: Soft.  Not tender, not distended.  G-tube positioned in right upper quadrant with ostomy site benign, no leakage, no bleeding, no erythema..   Rectal: No visible or palpable masses.  Stool is soft and reddish/bloody/medium brown.  Tests rapidly FOBT positive Musc/Skeltl:  No joint redness or swelling.  Thin upper and lower limbs Extremities: No CCE Neurologic: Alert.  Good historian.  Speech is a bit slow/halting but clear.  Moves all 4 limbs with grossly normal strength.  No tremors. Skin: No telangiectasia, rashes, sores Tattoos: None observed Nodes: No cervical adenopathy Psych: Cooperative, animated, pleasant  Intake/Output from previous day: 06/29 0701 - 06/30 0700 In: 250 [IV Piggyback:250] Out: -  Intake/Output this shift: No intake/output data recorded.  LAB RESULTS: Recent Labs    09/01/19 2325 09/02/19 0331 09/02/19 0644  WBC 9.5 7.7 8.2  HGB 11.5* 10.7* 10.2*  HCT 37.0* 34.6* 33.1*  PLT 450* 405* 418*   BMET Lab Results  Component Value Date   NA 142 09/02/2019   NA 143 09/01/2019   NA 135 08/25/2019   K 4.4 09/02/2019   K 4.6 09/01/2019   K 4.3 08/25/2019   CL 107 09/02/2019   CL 105 09/01/2019   CL 99 08/25/2019   CO2 26 09/02/2019   CO2 29 09/01/2019   CO2 26 08/25/2019   GLUCOSE 105 (H) 09/02/2019   GLUCOSE 101 (H) 09/01/2019   GLUCOSE 135 (H) 08/25/2019   BUN 42 (H) 09/02/2019   BUN 42 (H) 09/01/2019   BUN 27 (H) 08/25/2019   CREATININE 1.41 (H) 09/02/2019   CREATININE 1.56 (H) 09/01/2019   CREATININE 1.43 (H) 08/25/2019   CALCIUM 9.2 09/02/2019   CALCIUM 9.4 09/01/2019   CALCIUM 8.7 (L) 08/25/2019   LFT Recent Labs    09/01/19 2325  PROT 7.3  ALBUMIN 3.4*  AST 36  ALT 63*  ALKPHOS 122  BILITOT 0.7   PT/INR Lab Results  Component Value Date   INR 1.4 (H) 07/09/2019   INR 1.9 (A) 04/29/2018   INR 2.4 04/08/2018   Hepatitis Panel No results for input(s): HEPBSAG, HCVAB, HEPAIGM, HEPBIGM in the last 72 hours. C-Diff No components found for: CDIFF Lipase     Component Value Date/Time   LIPASE 39 08/02/2019 1159    Drugs of Abuse     Component Value Date/Time   LABOPIA NONE DETECTED 07/10/2019 0121   COCAINSCRNUR NONE DETECTED 07/10/2019 0121   LABBENZ NONE DETECTED 07/10/2019 0121    AMPHETMU NONE DETECTED 07/10/2019 0121   THCU NONE DETECTED 07/10/2019 0121   LABBARB NONE DETECTED 07/10/2019 0121     RADIOLOGY STUDIES: No results found.   IMPRESSION:   *   Bleeding PR, bloody stools reported during recent admission.   EGD 08/04/19 for dysphagia, normal study No records of previous colonoscopy.   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  Has never had colonoscopy so need to rule out neoplasia, vascular malformations etc.  *   Recent CVA with intervention, stent to R ICA.  Plavix not on hold, last dose was 6/29, yesterday.    *    Neurogenic dysphagia following CVA.  Gastric feeding tube placed by IR.  In the last few days patient is been taking  solid p.o.'s at SNF.  Currently n.p.o.  *   Afib, previous Eliquis, stopped during recent admission  *   Anemia.  Hg down in last several hours, overall improved from recent admission.  Hx anemia chronic dz and transfusions in 2019  *    Asymptomatic choledocholithiasis dates back and seen on imaging 2018 and 2019.  Currently mild elevation in ALT, but LFTs overall improved compared with levels during recent admission.  *    CHF.  EF 35 to 40% per echo of 07/10/2019  *   Remote history gastric versus duodenal ulcer.  On outpatient Protonix, not currently continued though did receive 1 dose of IV Protonix last night   PLAN:     *  Colonoscopy?  Dr Tarri Glenn will folllow pt.   *   Resume TF of orders (Jevity 1.5 at 50 ml/hour, prostat 30 mL bid, 200 ml free water q 4 hours were outlined by RD on 6/22) and  Protonix via tube.    *   Have formal speech eval to assess what he can take in terms of po.    1330 addendum: swallow "lookd terrible" at prelim SLP eval, so needs NPO.       Azucena Freed  09/02/2019, 10:47 AM Phone [phone]

## 2019-09-02 NOTE — Progress Notes (Signed)
Patient seen and examined.  Admitted early morning of hours by nighttime hospitalist.  Patient with extensive medical issues, see H&P done by Dr. Hal Hope.  He was recently discharged on Plavix for intracranial stenosis after suffering a stroke, PEG tube with dysphagia.  Presents back with fresh rectal bleeding.  Hemoglobin fairly stable. Called and discussed case with gastroenterology.  They are evaluating whether he will undergo colonoscopy/limited sigmoidoscopy tomorrow.  Plan: Recently started feeling, repeat swallow evaluation with aspiration, keep NPO. Tube feeding on hold pending decision about colonoscopy. Otherwise largely remains a stable.  Updated patient's wife at the bedside.

## 2019-09-02 NOTE — H&P (Signed)
History and Physical    Stephen Copeland JJO:[id number] DOB: Dec 13, 1940 DOA: 09/01/2019  PCP: [name]., MD  Patient coming from: Skilled nursing facility.  [facility].  Chief Complaint: Bleeding per rectum.  HPI: Stephen Copeland is a 79 y.o. male with history of A. fib cardiomyopathy hypertension previous history of prostate cancer who was recently admitted and discharged last week after having a complicated course after patient was diagnosed with stroke underwent intervention stent placement for stroke and complicated with intracranial hemorrhage and respiratory failure requiring intubation secondary to aspiration pneumonia and during the stay patient also was noted to have bright red blood per rectum for which patient's Eliquis had to be held off and on eventually discharged with no Eliquis but Brilinta presently on Plavix for the stent was noticed to have some bleeding per rectum at the living facility..  Denies any nausea vomiting.  Patient has a PEG tube secondary to dysphagia.  Patient states over the last 3 days been eating solid food.  During the last day patient also had an EGD which did not show any acute.  ED Course: In the ER patient again was noted to have some bleeding per rectum.  Hemodynamically stable.  Lab work show hemoglobin is around 1.5 increased from baseline from recent stay which was 9.  Creatinine increased from baseline around 1.4 and is around 1.5.  Covid test was negative.  Patient admitted for acute GI bleed.  Review of Systems: As per HPI, rest all negative.   Past Medical History:  Diagnosis Date  . Acid reflux   . CHF (congestive heart failure) ([place])   . Coronary artery disease   . Dilated cardiomyopathy ([place])   . Folliculitis   . Gastric ulcer   . Hiatal hernia   . Hyperlipidemia    a. pt is adamantly against statins.  . Hypertension   . Ischemia    a. Pt states he was diagnosed with "ischemia" in the 1990s but does not know further details,  denies hx of heart blockage.  . Nummular dermatitis   . Prostate cancer ([place])   . Renal disorder   . Scoliosis   . Sinus bradycardia   . Stomach ulcer    a. remote ulcer in the 1990s, no hx of bleeding.    Past Surgical History:  Procedure Laterality Date  . ESOPHAGOGASTRODUODENOSCOPY (EGD) WITH PROPOFOL N/A 08/04/2019   Procedure: ESOPHAGOGASTRODUODENOSCOPY (EGD) WITH PROPOFOL;  Surgeon: Jesusita Oka, MD;  Location: [facility] END[facility];  Service: General;  Laterality: N/A;  . INGUINAL HERNIA REPAIR Left   . IR ANGIO INTRA EXTRACRAN SEL COM CAROTID INNOMINATE UNI L MOD SED  07/10/2019  . IR ANGIO VERTEBRAL SEL SUBCLAVIAN INNOMINATE UNI R MOD SED  07/10/2019  . IR CT HEAD LTD  07/10/2019  . IR INTRAVSC STENT CERV CAROTID W/O EMB-PROT MOD SED INC ANGIO  07/10/2019  . IR PERCUTANEOUS ART THROMBECTOMY/INFUSION INTRACRANIAL INC DIAG ANGIO  07/10/2019  . IR REPLC GASTRO/COLONIC TUBE PERCUT W/FLUORO  08/13/2019  . PEG PLACEMENT N/A 08/04/2019   Procedure: PERCUTANEOUS ENDOSCOPIC GASTROSTOMY (PEG) PLACEMENT;  Surgeon: Jesusita Oka, MD;  Location: [place];  Service: General;  Laterality: N/A;  . RADIOLOGY WITH ANESTHESIA N/A 07/09/2019   Procedure: IR WITH ANESTHESIA;  Surgeon: Luanne Bras, MD;  Location: [place];  Service: Radiology;  Laterality: N/A;     reports that he has never smoked. He has never used smokeless tobacco. He reports that he does not drink alcohol and does not use  drugs.  No Known Allergies  Family History  Problem Relation Age of Onset  . Heart disease Mother        Further details not reported, died at age 23  . Valvular heart disease Father        H/o MV surgery, diet at age 15    Prior to Admission medications   Medication Sig Start Date End Date Taking? Authorizing Provider  acetaminophen (TYLENOL) 325 MG tablet Take 650 mg by mouth every 6 (six) hours as needed for mild pain.   Yes [provider]  Amino Acids-Protein Hydrolys (FEEDING SUPPLEMENT,  PRO-STAT SUGAR FREE 64,) LIQD Place 30 mLs into feeding tube 2 (two) times daily. 08/26/19  Yes Donzetta Starch, NP  clopidogrel (PLAVIX) 75 MG tablet Take 75 mg by mouth in the morning and at bedtime.   Yes [provider]  feeding supplement, ENSURE ENLIVE, (ENSURE ENLIVE) LIQD Take 237 mLs by mouth 2 (two) times daily between meals. 08/26/19  Yes Donzetta Starch, NP  hydrALAZINE (APRESOLINE) 25 MG tablet Place 1 tablet (25 mg total) into feeding tube every 8 (eight) hours. 08/26/19  Yes Donzetta Starch, NP  lidocaine (XYLOCAINE) 5 % ointment Apply 1 application topically 2 (two) times daily as needed (pain). 08/26/19  Yes Donzetta Starch, NP  Maltodextrin-Xanthan Gum (RESOURCE THICKENUP CLEAR) POWD Take 120 g by mouth as needed (for honey thick liquid consistency). 08/26/19  Yes Donzetta Starch, NP  metoprolol tartrate (LOPRESSOR) 25 mg/10 mL SUSP Place 10 mLs (25 mg total) into feeding tube 2 (two) times daily. 08/26/19  Yes Donzetta Starch, NP  Nystatin (GERHARDT'S BUTT CREAM) CREA Apply 1 application topically every 8 (eight) hours as needed for irritation. 08/26/19  Yes Donzetta Starch, NP  pantoprazole sodium (PROTONIX) 40 mg/20 mL PACK Place 20 mLs (40 mg total) into feeding tube daily. 08/26/19  Yes Donzetta Starch, NP  Nutritional Supplements (FEEDING SUPPLEMENT, JEVITY 1.5 CAL/FIBER,) LIQD Place 1,000 mLs into feeding tube continuous. 08/26/19   Donzetta Starch, NP  ticagrelor (BRILINTA) 90 MG TABS tablet Place 1 tablet (90 mg total) into feeding tube 2 (two) times daily. Patient not taking: Reported on 09/02/2019 08/26/19   Donzetta Starch, NP  Water For Irrigation, Sterile (FREE WATER) SOLN Place 200 mLs into feeding tube every 4 (four) hours. 08/26/19   Donzetta Starch, NP    Physical Exam: Constitutional: Moderately built and nourished. Vitals:   09/01/19 2315 09/02/19 0030 09/02/19 0123 09/02/19 0257  BP: (!) 103/34 131/75 126/78 136/76  Pulse: 75 72 85 88  Resp: 18  18 16   Temp: 98.5 F  (36.9 C)   98.1 F (36.7 C)  TempSrc: Oral   Oral  SpO2: 97% 95% 97% 98%  Weight:    73.9 kg  Height:    6\' 2"  (1.88 m)   Eyes: Anicteric no pallor. ENMT: No discharge from the ears eyes nose or mouth. Neck: No mass felt.  No neck rigidity. Respiratory: No rhonchi or crepitations. Cardiovascular: S1-S2 heard. Abdomen: Soft nontender bowel sounds present.  PEG tube seen. Musculoskeletal: No edema. Skin: No rash. Neurologic: Alert awake oriented time place and person.  Slurred speech from recent stroke.  Moves all extremities patient is bedbound. Psychiatric: Appears normal per normal affect.   Labs on Admission: I have personally reviewed following labs and imaging studies  CBC: Recent Labs  Lab 09/01/19 2325  WBC 9.5  NEUTROABS 5.3  HGB 11.5*  HCT  37.0*  MCV 97.4  PLT 650*   Basic Metabolic Panel: Recent Labs  Lab 09/01/19 2325  NA 143  K 4.6  CL 105  CO2 29  GLUCOSE 101*  BUN 42*  CREATININE 1.56*  CALCIUM 9.4   GFR: Estimated Creatinine Clearance: 40.8 mL/min (A) (by C-G formula based on SCr of 1.56 mg/dL (H)). Liver Function Tests: Recent Labs  Lab 09/01/19 2325  AST 36  ALT 63*  ALKPHOS 122  BILITOT 0.7  PROT 7.3  ALBUMIN 3.4*   No results for input(s): LIPASE, AMYLASE in the last 168 hours. No results for input(s): AMMONIA in the last 168 hours. Coagulation Profile: No results for input(s): INR, PROTIME in the last 168 hours. Cardiac Enzymes: No results for input(s): CKTOTAL, CKMB, CKMBINDEX, TROPONINI in the last 168 hours. BNP (last 3 results) No results for input(s): PROBNP in the last 8760 hours. HbA1C: No results for input(s): HGBA1C in the last 72 hours. CBG: Recent Labs  Lab 08/26/19 0348 08/26/19 0745 08/26/19 1126 08/26/19 1601 08/26/19 1934  GLUCAP 120* 125* 120* 118* 127*   Lipid Profile: No results for input(s): CHOL, HDL, LDLCALC, TRIG, CHOLHDL, LDLDIRECT in the last 72 hours. Thyroid Function Tests: No results for  input(s): TSH, T4TOTAL, FREET4, T3FREE, THYROIDAB in the last 72 hours. Anemia Panel: No results for input(s): VITAMINB12, FOLATE, FERRITIN, TIBC, IRON, RETICCTPCT in the last 72 hours. Urine analysis:    Component Value Date/Time   COLORURINE YELLOW 08/23/2019 1306   APPEARANCEUR HAZY (A) 08/23/2019 1306   LABSPEC 1.011 08/23/2019 1306   PHURINE 8.0 08/23/2019 1306   GLUCOSEU NEGATIVE 08/23/2019 1306   HGBUR NEGATIVE 08/23/2019 1306   BILIRUBINUR NEGATIVE 08/23/2019 1306   BILIRUBINUR negative 06/12/2016 1146   KETONESUR NEGATIVE 08/23/2019 1306   PROTEINUR NEGATIVE 08/23/2019 1306   UROBILINOGEN 1.0 06/12/2016 1146   NITRITE NEGATIVE 08/23/2019 1306   LEUKOCYTESUR NEGATIVE 08/23/2019 1306   Sepsis Labs: @LABRCNTIP (procalcitonin:4,lacticidven:4) ) Recent Results (from the past 240 hour(s))  Respiratory Panel by RT PCR (Flu A&B, Covid) - Nasopharyngeal Swab     Status: None   Collection Time: 08/26/19 11:48 AM   Specimen: Nasopharyngeal Swab  Result Value Ref Range Status   SARS Coronavirus 2 by RT PCR NEGATIVE NEGATIVE Final    Comment: (NOTE) SARS-CoV-2 target nucleic acids are NOT DETECTED.  The SARS-CoV-2 RNA is generally detectable in upper respiratoy specimens during the acute phase of infection. The lowest concentration of SARS-CoV-2 viral copies this assay can detect is 131 copies/mL. A negative result does not preclude SARS-Cov-2 infection and should not be used as the sole basis for treatment or other patient management decisions. A negative result may occur with  improper specimen collection/handling, submission of specimen other than nasopharyngeal swab, presence of viral mutation(s) within the areas targeted by this assay, and inadequate number of viral copies (<131 copies/mL). A negative result must be combined with clinical observations, patient history, and epidemiological information. The expected result is Negative.  Fact Sheet for Patients:    PinkCheek.be  Fact Sheet for Healthcare Providers:  GravelBags.it  This test is no t yet approved or cleared by the [place] FDA and  has been authorized for detection and/or diagnosis of SARS-CoV-2 by FDA under an Emergency Use Authorization (EUA). This EUA will remain  in effect (meaning this test can be used) for the duration of the COVID-19 declaration under Section 564(b)(1) of the Act, 21 U.S.C. section 360bbb-3(b)(1), unless the authorization is terminated or revoked sooner.  Influenza A by PCR NEGATIVE NEGATIVE Final   Influenza B by PCR NEGATIVE NEGATIVE Final    Comment: (NOTE) The Xpert Xpress SARS-CoV-2/FLU/RSV assay is intended as an aid in  the diagnosis of influenza from Nasopharyngeal swab specimens and  should not be used as a sole basis for treatment. Nasal washings and  aspirates are unacceptable for Xpert Xpress SARS-CoV-2/FLU/RSV  testing.  Fact Sheet for Patients: PinkCheek.be  Fact Sheet for Healthcare Providers: GravelBags.it  This test is not yet approved or cleared by the [place] FDA and  has been authorized for detection and/or diagnosis of SARS-CoV-2 by  FDA under an Emergency Use Authorization (EUA). This EUA will remain  in effect (meaning this test can be used) for the duration of the  Covid-19 declaration under Section 564(b)(1) of the Act, 21  U.S.C. section 360bbb-3(b)(1), unless the authorization is  terminated or revoked. Performed at [facility], [place] [address], [place], [place] [address]   SARS Coronavirus 2 by RT PCR (hospital order, performed in [facility] hospital lab) Nasopharyngeal Nasopharyngeal Swab     Status: None   Collection Time: 09/02/19  1:21 AM   Specimen: Nasopharyngeal Swab  Result Value Ref Range Status   SARS Coronavirus 2 NEGATIVE NEGATIVE Final    Comment: (NOTE) SARS-CoV-2  target nucleic acids are NOT DETECTED.  The SARS-CoV-2 RNA is generally detectable in upper and lower respiratory specimens during the acute phase of infection. The lowest concentration of SARS-CoV-2 viral copies this assay can detect is 250 copies / mL. A negative result does not preclude SARS-CoV-2 infection and should not be used as the sole basis for treatment or other patient management decisions.  A negative result may occur with improper specimen collection / handling, submission of specimen other than nasopharyngeal swab, presence of viral mutation(s) within the areas targeted by this assay, and inadequate number of viral copies (<250 copies / mL). A negative result must be combined with clinical observations, patient history, and epidemiological information.  Fact Sheet for Patients:   StrictlyIdeas.no  Fact Sheet for Healthcare Providers: BankingDealers.co.za  This test is not yet approved or  cleared by the [place] FDA and has been authorized for detection and/or diagnosis of SARS-CoV-2 by FDA under an Emergency Use Authorization (EUA).  This EUA will remain in effect (meaning this test can be used) for the duration of the COVID-19 declaration under Section 564(b)(1) of the Act, 21 U.S.C. section 360bbb-3(b)(1), unless the authorization is terminated or revoked sooner.  Performed at [facility], [place] [address], [place], [place] [address]      Radiological Exams on Admission: No results found.    Assessment/Plan Principal Problem:   Acute GI bleeding Active Problems:   Paroxysmal atrial fibrillation (HCC)   Congestive dilated cardiomyopathy (HCC)   History of prostate cancer   SAH (subarachnoid hemorrhage) (HCC) s/p clot retrieval and stent placement    1. Acute GI bleeding -given that patient has frank rectal bleeding likely could be lower GI.  We will closely monitor CBC.  I did discuss  with on-call neurologist Dr. Demetra Shiner given that patient has had recent stent placement for the stroke no pallor advised not to discontinue the present antiplatelet agents since patient has to be on it for next 3 months.  Transfuse if hemoglobin less than 7 or patient becomes hypotensive.  Looks like patient has had bleeding per rectum during last month stay also.  EGD was unremarkable at that time.  Consult [place] [facility] in the morning. 2. Recent stroke with intervention with stent placement presently on Plavix need to be on it for at least 3 months as per the neurologist cannot discontinue.  Patient's Eliquis was withheld at the time of discharge due to bleeding per rectum.  Not sure if it was restarted again.  Placement neurologist said that Eliquis can be held but Plavix has to be continued.  If patient continues to bleed need to discuss with Dr. Estanislado Pandy interventional neurologist about holding off any antiplatelet agents. 3. History of A. fib Eliquis on hold due to recent bleed IV metoprolol for rate control. 4. Hypertension on as needed IV hydralazine and IV scheduled metoprolol follow-up. 5. Recent stroke complicated with dysphagia on PEG tube.  Patient states he has started eating orally for last 3 days. 6. Dilated cardiomyopathy appears compensated. 7. Anemia appears to be chronic and will be following CBC closely given the rectal bleeding. 8. Acute on chronic kidney disease stage III follow metabolic panel closely.  Since patient has rectal bleeding with multiple comorbidities including recent stroke with stent placement and history of A. fib will need close monitoring for any deterioration in inpatient status.   DVT prophylaxis: SCDs for now since patient has acute GI bleed will be avoiding anticoagulation. Code Status: Full code. Family Communication: Discussed with patient. Disposition Plan: To be determined. Consults called: Discussed with neurologist. Admission  status: Inpatient.   Rise Patience MD [facility] Pager [phone].  If 7PM-7AM, please contact night-coverage [email or web address] Password [email or web address]  09/02/2019, 3:18 AM

## 2019-09-02 NOTE — Progress Notes (Signed)
Pt unable to tolerate large quantities of fluid via peg tube (attempting to administer bowel prep for colonoscopy tomorrow). C/o "feeling the liquid up in his throat", gagging, and difficulty breathing. Administering 50-60 ml every 10-15 min as tolerated.  Coolidge Breeze, RN 09/02/2019

## 2019-09-03 ENCOUNTER — Encounter (HOSPITAL_COMMUNITY): Payer: Self-pay | Admitting: Internal Medicine

## 2019-09-03 ENCOUNTER — Encounter (HOSPITAL_COMMUNITY)
Admission: EM | Disposition: A | Discharge status: Skilled Nursing Facility | Payer: Self-pay | Source: Skilled Nursing Facility | Attending: Internal Medicine

## 2019-09-03 ENCOUNTER — Inpatient Hospital Stay (HOSPITAL_COMMUNITY): Payer: Medicare Other | Admitting: Anesthesiology

## 2019-09-03 LAB — GLUCOSE, CAPILLARY
Glucose-Capillary: 89 mg/dL (ref 70–99)
Glucose-Capillary: 80 mg/dL (ref 70–99)

## 2019-09-03 LAB — CBC
HCT: 34.7 % — ABNORMAL LOW (ref 39.0–52.0)
Hemoglobin: 10.7 g/dL — ABNORMAL LOW (ref 13.0–17.0)
MCH: 30.1 pg (ref 26.0–34.0)
MCHC: 30.8 g/dL (ref 30.0–36.0)
MCV: 97.5 fL (ref 80.0–100.0)
Platelets: 403 10*3/uL — ABNORMAL HIGH (ref 150–400)
RBC: 3.56 MIL/uL — ABNORMAL LOW (ref 4.22–5.81)
RDW: 17.5 % — ABNORMAL HIGH (ref 11.5–15.5)
WBC: 8.9 10*3/uL (ref 4.0–10.5)
nRBC: 0 % (ref 0.0–0.2)

## 2019-09-03 SURGERY — CANCELLED PROCEDURE
Anesthesia: Monitor Anesthesia Care

## 2019-09-03 MED ORDER — POLYVINYL ALCOHOL 1.4 % OP SOLN
1.0000 [drp] | OPHTHALMIC | Status: DC | PRN
  Filled 2019-09-03: qty 15

## 2019-09-03 MED ORDER — PEG-KCL-NACL-NASULF-NA ASC-C 100 G PO SOLR
0.5000 | Freq: Once | ORAL | Status: AC
  Administered 2019-09-03: 100 g via ORAL
  Filled 2019-09-03: qty 1

## 2019-09-03 MED ORDER — HYPROMELLOSE (GONIOSCOPIC) 2.5 % OP SOLN: 1.0000 [drp] | Freq: Four times a day (QID) | OPHTHALMIC | Status: DC | PRN

## 2019-09-03 MED ORDER — PEG-KCL-NACL-NASULF-NA ASC-C 100 G PO SOLR
0.5000 | Freq: Once | ORAL | Status: AC
  Administered 2019-09-04: 100 g via ORAL

## 2019-09-03 MED ORDER — PEG-KCL-NACL-NASULF-NA ASC-C 100 G PO SOLR: 1.0000 | Freq: Once | ORAL | Status: DC

## 2019-09-03 SURGICAL SUPPLY — 21 items

## 2019-09-03 NOTE — Anesthesia Preprocedure Evaluation (Deleted)
Anesthesia Evaluation  Patient identified by MRN, date of birth, ID band Patient awake    Reviewed: Allergy & Precautions, NPO status , Patient's Chart, lab work & pertinent test results, reviewed documented beta blocker date and time   Airway Mallampati: II  TM Distance: >3 FB Neck ROM: Full    Dental no notable dental hx. (+) Teeth Intact, Dental Advisory Given   Pulmonary pneumonia, resolved,  Intubated 5/6 for code stroke- remained intubated for 2d d/t AHRF likely 2/2 aspiration. Extubated 07/11/19   Pulmonary exam normal breath sounds clear to auscultation       Cardiovascular hypertension, Pt. on medications and Pt. on home beta blockers + CAD and +CHF  + dysrhythmias Atrial Fibrillation  Rhythm:Irregular Rate:Tachycardia  Dilated Cardiomyopathy   Echo 07/10/19:  1. Left ventricular ejection fraction, by estimation, is 35 to 40%. The left ventricle has moderately decreased function. The left ventricle demonstrates global hypokinesis. There is mild concentric left ventricular hypertrophy. Left ventricular diastolic function could not be evaluated.  2. Right ventricular systolic function is normal. The right ventricular size is normal. There is normal pulmonary artery systolic pressure.  3. The mitral valve was not well visualized. No evidence of mitral valve regurgitation.  4. The aortic valve was not well visualized. Aortic valve regurgitation is not visualized.   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 embolic secondary to known AF even on Eliquis  follows simple commands without aphasia but with dysarthria and perseveration, left lower facial weakness however he reports sensation is intact in his face, right side 5 out of 5. left side with  mild hemiparesis. CVA, Residual Symptoms negative psych ROS   GI/Hepatic Neg liver ROS, hiatal hernia, PUD, GERD  Medicated and Controlled,Dysphagia  Hx/o PEG insertion S/P hiatal hernia repair   Endo/Other  Hyperlipidemia  Renal/GU ARF and CRFRenal diseaseCr 1.29, AKI on CKD with this admission for stroke    Hx prostate Ca negative genitourinary   Musculoskeletal Scoliosis   Abdominal Normal abdominal exam  (+)   Peds  Hematology  (+) anemia , Plavix therapy- last dose 6/29   Anesthesia Other Findings Breathing intermittently labored, maintains SpO2  Reproductive/Obstetrics negative OB ROS                            Anesthesia Physical  Anesthesia Plan  ASA: IV  Anesthesia Plan: MAC   Post-op Pain Management:    Induction: Intravenous  PONV Risk Score and Plan: Propofol infusion and TIVA  Airway Management Planned: Natural Airway and Nasal Cannula  Additional Equipment: None  Intra-op Plan:   Post-operative Plan:   Informed Consent: I have reviewed the patients History and Physical, chart, labs and discussed the procedure including the risks, benefits and alternatives for the proposed anesthesia with the patient or authorized representative who has indicated his/her understanding and acceptance.     Dental advisory given  Plan Discussed with: CRNA  Anesthesia Plan Comments:         Anesthesia Quick Evaluation

## 2019-09-03 NOTE — Progress Notes (Signed)
SLP Cancellation Note  Patient Details Name: Stephen Copeland MRN: [id number] DOB: 1940-07-24   Cancelled treatment:       Reason Eval/Treat Not Completed: Medical issues which prohibited therapy; colonoscopy unable to be completed today; rescheduled for tomorrow. SLP will follow for readiness for MBS when appropriate.  Oniya Mandarino L. Tivis Ringer, [place] CCC/SLP Acute Rehabilitation Services Office number [phone] Pager [phone]    [name] 09/03/2019, 2:23 PM

## 2019-09-03 NOTE — Progress Notes (Signed)
PROGRESS NOTE    Mirza Fessel  GUR:[id number] DOB: Nov 16, 1940 DOA: 09/01/2019 PCP: [name]., MD   Chef Complaints: Rectal bleeding  Brief Narrative: 79 y.o. male with history of A. fib cardiomyopathy hypertension previous history of prostate cancer who was recently admitted and discharged last week after having a complicated course after patient was diagnosed with stroke underwent intervention stent placement for stroke and complicated with intracranial hemorrhage and respiratory failure requiring intubation secondary to aspiration pneumonia and during the stay patient also was noted to have bright red blood per rectum for which patient's Eliquis had to be held off and on eventually discharged with no Eliquis but Brilinta presently on Plavix for the stent was noticed to have some bleeding per rectum at the living facility..  Denies any nausea vomiting.  Patient has a PEG tube secondary to dysphagia.  Patient states over the last 3 days been eating solid food.  During the last day patient also had an EGD which did not show any acute.  ED Course: In the ER patient again was noted to have some bleeding per rectum.  Hemodynamically stable.  Lab work show hemoglobin is around 1.5 increased from baseline from recent stay which was 9.  Creatinine increased from baseline around 1.4 and is around 1.5.  Covid test was negative.  Patient admitted for acute GI bleed  Subjective: Reports he had leakage from "down there" getting colonoscopy prep  No bleeding  Assessment & Plan:  Acute GI bleeding with rectal bleeding.  CBC overall stable.  Patient had similar episode where Eliquis was discontinued and last admission and has been on Plavix for his stent placement for the stroke.  Neurology advised not to discontinue the present antiplatelets since patient has to be on it for next 3 months.  Continue to monitor and transfuse for hemoglobin less than 7, EGD was unremarkable last admission GI was  consulted plan for colonoscopy today.  Paroxysmal atrial fibrillation Eliquis on hold.  Continue metoprolol.  Recent stroke with intervention with stent placement: Patient on Plavix.  Neurology advised Eliquis can be held but Plavix has to be continued if patient continues to have bleed Dr. Metta Clines from interventional neurologist needs to be discussed for further antiplatelets recommendation.  Congestive dilated cardiomyopathy-he is compensated.  Dysphagia PEG tube in place.  Patient reports he is taking orally as well for last 3 days PTA.  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  Monitor  History of prostate cancer  DVT prophylaxis: SCDs Start: 09/02/19 0315 Code Status: full Family Communication: plan of care discussed with patient at bedside.  Status is: Inpatient  Remains inpatient appropriate because:Ongoing diagnostic testing needed not appropriate for outpatient work up and Inpatient level of care appropriate due to severity of illness   Dispo: The patient is from: SNF Lives at home with 51 yo wife              Anticipated d/c is to: SNF              Anticipated d/c date is: 2 days              Patient currently is not medically stable to d/c.  Nutrition: Diet Order            Diet NPO time specified Except for: Sips with Meds  Diet effective now                 Nutrition Problem: Inadequate oral intake Etiology: dysphagia (s/p stroke)  Signs/Symptoms: NPO status Interventions: Tube feeding, Prostat Body mass index is 20.93 kg/m. Consultants:see note  Procedures:see note Microbiology:see note  Medications: Scheduled Meds: . chlorhexidine  15 mL Mouth Rinse BID  . clopidogrel  75 mg Per Tube Daily  . feeding supplement (PRO-STAT SUGAR FREE 64)  30 mL Oral BID  . mouth rinse  15 mL Mouth Rinse q12n4p  . metoprolol tartrate  2.5 mg Intravenous Q6H  . pantoprazole sodium  40 mg Per Tube Daily  . peg 3350 powder  0.5 kit Oral Once   And  .  [START ON 09/04/2019] peg 3350 powder  0.5 kit Oral Once   Continuous Infusions:  Antimicrobials: Anti-infectives (From admission, onward)   None       Objective: Vitals: Today's Vitals   09/03/19 0000 09/03/19 0329 09/03/19 0943 09/03/19 1024  BP:  125/80 (!) 142/83 138/81  Pulse:  82 98 93  Resp:  '16 14 16  ' Temp:  97.7 F (36.5 C) 98.7 F (37.1 C) 97.7 F (36.5 C)  TempSrc:  Oral Axillary Oral  SpO2:  95% 93% 96%  Weight:      Height:      PainSc: Asleep  0-No pain     Intake/Output Summary (Last 24 hours) at 09/03/2019 1323 Last data filed at 09/03/2019 1039 Gross per 24 hour  Intake 516.18 ml  Output 1100 ml  Net -583.82 ml   Filed Weights   09/02/19 0257  Weight: 73.9 kg   Weight change:    Intake/Output from previous day: 06/30 0701 - 07/01 0700 In: 516.2 [I.V.:516.2] Out: 900 [Urine:900] Intake/Output this shift: Total I/O In: -  Out: 400 [Urine:400]  Examination:  General exam: AAOx3,NAD,weak appearing. HEENT:Oral mucosa moist, Ear/Nose WNL grossly,dentition normal. Respiratory system: bilaterally clear,no wheezing or crackles,no use of accessory muscle, non tender. Cardiovascular system: S1 & S2 +, regular, No JVD. Gastrointestinal system: Abdomen soft, PEG+,NT,ND, BS+. Nervous System:Alert, awake, moving extremities and grossly nonfocal Extremities: No edema, distal peripheral pulses palpable.  Skin: No rashes,no icterus. MSK: Normal muscle bulk,tone, power  Data Reviewed: I have personally reviewed following labs and imaging studies CBC: Recent Labs  Lab 09/01/19 2325 09/02/19 0331 09/02/19 0644 09/02/19 1059 09/03/19 0802  WBC 9.5 7.7 8.2 7.0 8.9  NEUTROABS 5.3  --   --   --   --   HGB 11.5* 10.7* 10.2* 10.4* 10.7*  HCT 37.0* 34.6* 33.1* 33.4* 34.7*  MCV 97.4 98.0 97.1 96.8 97.5  PLT 450* 405* 418* 401* 672*   Basic Metabolic Panel: Recent Labs  Lab 09/01/19 2325 09/02/19 0331  NA 143 142  K 4.6 4.4  CL 105 107  CO2 29 26    GLUCOSE 101* 105*  BUN 42* 42*  CREATININE 1.56* 1.41*  CALCIUM 9.4 9.2   GFR: Estimated Creatinine Clearance: 45.1 mL/min (A) (by C-G formula based on SCr of 1.41 mg/dL (H)). Liver Function Tests: Recent Labs  Lab 09/01/19 2325  AST 36  ALT 63*  ALKPHOS 122  BILITOT 0.7  PROT 7.3  ALBUMIN 3.4*   No results for input(s): LIPASE, AMYLASE in the last 168 hours. No results for input(s): AMMONIA in the last 168 hours. Coagulation Profile: No results for input(s): INR, PROTIME in the last 168 hours. Cardiac Enzymes: No results for input(s): CKTOTAL, CKMB, CKMBINDEX, TROPONINI in the last 168 hours. BNP (last 3 results) No results for input(s): PROBNP in the last 8760 hours. HbA1C: No results for input(s): HGBA1C in the last 72 hours. CBG:  Recent Labs  Lab 09/02/19 1212 09/02/19 1815 09/02/19 2340 09/03/19 0605 09/03/19 1215  GLUCAP 101* 100* 96 89 80   Lipid Profile: No results for input(s): CHOL, HDL, LDLCALC, TRIG, CHOLHDL, LDLDIRECT in the last 72 hours. Thyroid Function Tests: No results for input(s): TSH, T4TOTAL, FREET4, T3FREE, THYROIDAB in the last 72 hours. Anemia Panel: No results for input(s): VITAMINB12, FOLATE, FERRITIN, TIBC, IRON, RETICCTPCT in the last 72 hours. Sepsis Labs: No results for input(s): PROCALCITON, LATICACIDVEN in the last 168 hours.  Recent Results (from the past 240 hour(s))  Respiratory Panel by RT PCR (Flu A&B, Covid) - Nasopharyngeal Swab     Status: None   Collection Time: 08/26/19 11:48 AM   Specimen: Nasopharyngeal Swab  Result Value Ref Range Status   SARS Coronavirus 2 by RT PCR NEGATIVE NEGATIVE Final    Comment: (NOTE) SARS-CoV-2 target nucleic acids are NOT DETECTED.  The SARS-CoV-2 RNA is generally detectable in upper respiratoy specimens during the acute phase of infection. The lowest concentration of SARS-CoV-2 viral copies this assay can detect is 131 copies/mL. A negative result does not preclude  SARS-Cov-2 infection and should not be used as the sole basis for treatment or other patient management decisions. A negative result may occur with  improper specimen collection/handling, submission of specimen other than nasopharyngeal swab, presence of viral mutation(s) within the areas targeted by this assay, and inadequate number of viral copies (<131 copies/mL). A negative result must be combined with clinical observations, patient history, and epidemiological information. The expected result is Negative.  Fact Sheet for Patients:  PinkCheek.be  Fact Sheet for Healthcare Providers:  GravelBags.it  This test is no t yet approved or cleared by the [place] FDA and  has been authorized for detection and/or diagnosis of SARS-CoV-2 by FDA under an Emergency Use Authorization (EUA). This EUA will remain  in effect (meaning this test can be used) for the duration of the COVID-19 declaration under Section 564(b)(1) of the Act, 21 U.S.C. section 360bbb-3(b)(1), unless the authorization is terminated or revoked sooner.     Influenza A by PCR NEGATIVE NEGATIVE Final   Influenza B by PCR NEGATIVE NEGATIVE Final    Comment: (NOTE) The Xpert Xpress SARS-CoV-2/FLU/RSV assay is intended as an aid in  the diagnosis of influenza from Nasopharyngeal swab specimens and  should not be used as a sole basis for treatment. Nasal washings and  aspirates are unacceptable for Xpert Xpress SARS-CoV-2/FLU/RSV  testing.  Fact Sheet for Patients: PinkCheek.be  Fact Sheet for Healthcare Providers: GravelBags.it  This test is not yet approved or cleared by the [place] FDA and  has been authorized for detection and/or diagnosis of SARS-CoV-2 by  FDA under an Emergency Use Authorization (EUA). This EUA will remain  in effect (meaning this test can be used) for the duration of the   Covid-19 declaration under Section 564(b)(1) of the Act, 21  U.S.C. section 360bbb-3(b)(1), unless the authorization is  terminated or revoked. Performed at [facility], [place] [address], [place],  [address]   SARS Coronavirus 2 by RT PCR (hospital order, performed in [facility] hospital lab) Nasopharyngeal Nasopharyngeal Swab     Status: None   Collection Time: 09/02/19  1:21 AM   Specimen: Nasopharyngeal Swab  Result Value Ref Range Status   SARS Coronavirus 2 NEGATIVE NEGATIVE Final    Comment: (NOTE) SARS-CoV-2 target nucleic acids are NOT DETECTED.  The SARS-CoV-2 RNA is generally detectable in upper and lower respiratory specimens  during the acute phase of infection. The lowest concentration of SARS-CoV-2 viral copies this assay can detect is 250 copies / mL. A negative result does not preclude SARS-CoV-2 infection and should not be used as the sole basis for treatment or other patient management decisions.  A negative result may occur with improper specimen collection / handling, submission of specimen other than nasopharyngeal swab, presence of viral mutation(s) within the areas targeted by this assay, and inadequate number of viral copies (<250 copies / mL). A negative result must be combined with clinical observations, patient history, and epidemiological information.  Fact Sheet for Patients:   StrictlyIdeas.no  Fact Sheet for Healthcare Providers: BankingDealers.co.za  This test is not yet approved or  cleared by the [place] FDA and has been authorized for detection and/or diagnosis of SARS-CoV-2 by FDA under an Emergency Use Authorization (EUA).  This EUA will remain in effect (meaning this test can be used) for the duration of the COVID-19 declaration under Section 564(b)(1) of the Act, 21 U.S.C. section 360bbb-3(b)(1), unless the authorization is terminated or revoked sooner.  Performed at [facility], [place] [address] [place], [place] [address]       Radiology Studies: No results found.   LOS: 1 day   Antonieta Pert, MD [facility]  09/03/2019, 1:23 PM

## 2019-09-04 ENCOUNTER — Inpatient Hospital Stay (HOSPITAL_COMMUNITY): Payer: Medicare Other | Admitting: Certified Registered Nurse Anesthetist

## 2019-09-04 ENCOUNTER — Encounter (HOSPITAL_COMMUNITY): Payer: Self-pay | Admitting: Internal Medicine

## 2019-09-04 ENCOUNTER — Encounter (HOSPITAL_COMMUNITY)
Admission: EM | Disposition: A | Discharge status: Skilled Nursing Facility | Payer: Self-pay | Source: Skilled Nursing Facility | Attending: Internal Medicine

## 2019-09-04 DIAGNOSIS — K921 Melena: Secondary | ICD-10-CM

## 2019-09-04 DIAGNOSIS — K5520 Angiodysplasia of colon without hemorrhage: Secondary | ICD-10-CM

## 2019-09-04 HISTORY — PX: COLONOSCOPY WITH PROPOFOL: SHX5780

## 2019-09-04 HISTORY — PX: BIOPSY: SHX5522

## 2019-09-04 LAB — BASIC METABOLIC PANEL
Anion gap: 11 (ref 5–15)
BUN: 30 mg/dL — ABNORMAL HIGH (ref 8–23)
CO2: 21 mmol/L — ABNORMAL LOW (ref 22–32)
Calcium: 8.5 mg/dL — ABNORMAL LOW (ref 8.9–10.3)
Chloride: 109 mmol/L (ref 98–111)
Creatinine, Ser: 1.27 mg/dL — ABNORMAL HIGH (ref 0.61–1.24)
GFR calc Af Amer: >60 mL/min (ref >60)
GFR calc non Af Amer: 54 mL/min — ABNORMAL LOW (ref >60)
Glucose, Bld: 84 mg/dL (ref 70–99)
Potassium: 3.7 mmol/L (ref 3.5–5.1)
Sodium: 141 mmol/L (ref 135–145)

## 2019-09-04 LAB — GLUCOSE, CAPILLARY
Glucose-Capillary: 65 mg/dL — ABNORMAL LOW (ref 70–99)
Glucose-Capillary: 70 mg/dL (ref 70–99)
Glucose-Capillary: 73 mg/dL (ref 70–99)
Glucose-Capillary: 74 mg/dL (ref 70–99)
Glucose-Capillary: 75 mg/dL (ref 70–99)
Glucose-Capillary: 77 mg/dL (ref 70–99)

## 2019-09-04 LAB — CBC
HCT: 31.4 % — ABNORMAL LOW (ref 39.0–52.0)
Hemoglobin: 9.6 g/dL — ABNORMAL LOW (ref 13.0–17.0)
MCH: 30.1 pg (ref 26.0–34.0)
MCHC: 30.6 g/dL (ref 30.0–36.0)
MCV: 98.4 fL (ref 80.0–100.0)
Platelets: 336 10*3/uL (ref 150–400)
RBC: 3.19 MIL/uL — ABNORMAL LOW (ref 4.22–5.81)
RDW: 17.2 % — ABNORMAL HIGH (ref 11.5–15.5)
WBC: 6.6 10*3/uL (ref 4.0–10.5)
nRBC: 0 % (ref 0.0–0.2)

## 2019-09-04 SURGERY — COLONOSCOPY WITH PROPOFOL
Anesthesia: Monitor Anesthesia Care

## 2019-09-04 MED ORDER — DEXTROSE 5 % IV SOLN: INTRAVENOUS | Status: DC

## 2019-09-04 MED ORDER — LACTATED RINGERS IV SOLN
INTRAVENOUS | Status: DC
  Administered 2019-09-04: 1000 mL via INTRAVENOUS

## 2019-09-04 MED ORDER — PROPOFOL 500 MG/50ML IV EMUL
INTRAVENOUS | Status: DC | PRN
  Administered 2019-09-04: 75 ug/kg/min via INTRAVENOUS

## 2019-09-04 SURGICAL SUPPLY — 22 items

## 2019-09-04 NOTE — Progress Notes (Signed)
SLP Cancellation Note  Patient Details Name: Stephen Copeland MRN: [id number] DOB: 1940-04-11   Cancelled treatment:    Pt adamantly refused SLP therapy despite encouragement from this clinician and his wife.  Orders have been written for full liquids s/p colonoscopy.  Unable to assess his ability to tolerate this diet due to his refusal.  Last assessment on Wed, 6/30 revealed concerns for aspiration with POs.  We have been awaiting completion of colonoscopy to proceed with MBS.      Pt declines MBS today and is asking to have papers signed so he may go home.  Neither he nor his wife verbalized understanding when I explained my concerns for potential aspiration of liquids.    SLP will continue efforts.    [name] L. Tivis Ringer, [place] Office number [phone] Pager [phone]     [name] 09/04/2019, 1:49 PM

## 2019-09-04 NOTE — Progress Notes (Signed)
Bowl prep finished at 0400, running clear

## 2019-09-04 NOTE — Progress Notes (Signed)
PROGRESS NOTE    Stephen Copeland  XFG:[id number] DOB: [birth year] DOA: 09/01/2019 PCP: [name]., MD   Chef Complaints: Rectal bleeding  Brief Narrative: 79 y.o. male with history of A. fib cardiomyopathy hypertension previous history of prostate cancer who was recently admitted and discharged last week after having a complicated course after patient was diagnosed with stroke underwent intervention stent placement for stroke and complicated with intracranial hemorrhage and respiratory failure requiring intubation secondary to aspiration pneumonia and during the stay patient also was noted to have bright red blood per rectum for which patient's Eliquis had to be held off and on eventually discharged with no Eliquis but Brilinta presently on Plavix for the stent was noticed to have some bleeding per rectum at the living facility..  Denies any nausea vomiting.  Patient has a PEG tube secondary to dysphagia.  Patient states over the last 3 days been eating solid food.  During the last day patient also had an EGD which did not show any acute.  ED Course: In the ER patient again was noted to have some bleeding per rectum.  Hemodynamically stable. Lab work show hemoglobin is around 1.5 increased from baseline from recent stay which was 9.  Creatinine increased from baseline around 1.4 and is around 1.5.  Covid test was negative.  Patient admitted for acute GI bleed  Subjective:  Reports  He was not clean enough for colonoscopy and going for scope today drank whole gallon. no bleeding recurrence and no chest pain shortness of breath, no nausea or vomiting  Assessment & Plan:  Acute GI bleeding with rectal bleeding.  For colonoscopy today due to poor prep yesterday.  Hemoglobin overall stable.Patient had similar episode where Eliquis was discontinued and last admission and has been on Plavix for his stent placement for the stroke.  Neurology was discussed this admission and has advised not to  discontinue the present antiplatelets since patient has to be on this for at least 3 months.  Monitor H&H closely transfuse for less than 7 g.    Paroxysmal atrial fibrillation Eliquis on hold.  Continue metoprolol.  Recent stroke with intervention with stent placement: Patient on Plavix.  Neurology advised Eliquis can be held but Plavix has to be continued.  If continues to have bleeding issues will need to talk with interventional neurologist Dr Alfonso Ramus regarding recommendation.   Anemia of chronic disease hemoglobin holding stable 9 to 10 g.  Monitor in the setting of acute GI bleed.  Metabolic acidosis with bicarb 21.  Monitor.  Dilated cardiomyopathy: Euvolemic.  Compensated.  Dysphagia PEG tube in place.  Patient reports he is taking orally as well for last 3 days PTA.  Continue diet post procedure as tolerated.  Hypoglycemia due to n.p.o. status monitor, treat accordingly Recent Labs  Lab 09/04/19 0014 09/04/19 0544 09/04/19 1219 09/04/19 1300 09/04/19 1311  GLUCAP 77 74 65* 70 73   CKD stage IIIb-baseline creatinine around 1.2-1.5.  Monitor.  Creatinine 1.2.  Monitor.  History of prostate cancer  DVT prophylaxis: SCDs Start: 09/02/19 0315 Code Status: full Family Communication: plan of care discussed with patient at bedside.  Status is: Inpatient  Remains inpatient appropriate because:Ongoing diagnostic testing needed not appropriate for outpatient work up and Inpatient level of care appropriate due to severity of illness   Dispo: The patient is from: SNF Lives at home with 79 yo wife              Anticipated d/c is to: SNF vs  Home Health.  PT OT eval post procedure.              Anticipated d/c date is: 1 day              Patient currently is not medically stable to d/c.  Nutrition: Diet Order            Diet NPO time specified Except for: Sips with Meds  Diet effective now                 Nutrition Problem: Inadequate oral intake Etiology: dysphagia  (s/p stroke) Signs/Symptoms: NPO status Interventions: Tube feeding, Prostat Body mass index is 20.93 kg/m. Consultants: GI, neurology Procedures: Microbiology:see note  Medications: Scheduled Meds: . chlorhexidine  15 mL Mouth Rinse BID  . clopidogrel  75 mg Per Tube Daily  . feeding supplement (PRO-STAT SUGAR FREE 64)  30 mL Oral BID  . mouth rinse  15 mL Mouth Rinse q12n4p  . metoprolol tartrate  2.5 mg Intravenous Q6H  . pantoprazole sodium  40 mg Per Tube Daily   Continuous Infusions:  Antimicrobials: Anti-infectives (From admission, onward)   None       Objective: Vitals: Today's Vitals   09/03/19 1333 09/03/19 2018 09/04/19 0000 09/04/19 0543  BP: 133/86 (!) 148/89  (!) 156/70  Pulse: 77 81  68  Resp: 16 16  16   Temp: 97.6 F (36.4 C) (!) 97.5 F (36.4 C)  97.6 F (36.4 C)  TempSrc: Oral Oral  Oral  SpO2: 98% 96%  96%  Weight:      Height:      PainSc:   Asleep     Intake/Output Summary (Last 24 hours) at 09/04/2019 0907 Last data filed at 09/03/2019 2342 Gross per 24 hour  Intake --  Output 600 ml  Net -600 ml   Filed Weights   09/02/19 0257  Weight: 73.9 kg   Weight change:    Intake/Output from previous day: 07/01 0701 - 07/02 0700 In: -  Out: 800 [Urine:800] Intake/Output this shift: No intake/output data recorded.  Examination:  General exam: AAOx3, pleasant, on room air,NAD, weak appearing. HEENT:Oral mucosa moist, Ear/Nose WNL grossly, dentition normal. Respiratory system: bilaterally clear,no wheezing or crackles,no use of accessory muscle Cardiovascular system: S1 & S2 +, No JVD,. Gastrointestinal system: Abdomen soft, NT,ND, BS+. PEG+ Nervous System:Alert, awake, moving extremities and grossly nonfocal Extremities: No edema, distal peripheral pulses palpable.  Skin: No rashes,no icterus. MSK: Normal muscle bulk,tone, power Data Reviewed: I have personally reviewed following labs and imaging studies CBC: Recent Labs  Lab  09/01/19 2325 09/01/19 2325 09/02/19 0331 09/02/19 0644 09/02/19 1059 09/03/19 0802 09/04/19 0334  WBC 9.5   < > 7.7 8.2 7.0 8.9 6.6  NEUTROABS 5.3  --   --   --   --   --   --   HGB 11.5*   < > 10.7* 10.2* 10.4* 10.7* 9.6*  HCT 37.0*   < > 34.6* 33.1* 33.4* 34.7* 31.4*  MCV 97.4   < > 98.0 97.1 96.8 97.5 98.4  PLT 450*   < > 405* 418* 401* 403* 336   < > = values in this interval not displayed.   Basic Metabolic Panel: Recent Labs  Lab 09/01/19 2325 09/02/19 0331 09/04/19 0334  NA 143 142 141  K 4.6 4.4 3.7  CL 105 107 109  CO2 29 26 21*  GLUCOSE 101* 105* 84  BUN 42* 42* 30*  CREATININE 1.56* 1.41* 1.27*  CALCIUM 9.4 9.2 8.5*   GFR: Estimated Creatinine Clearance: 50.1 mL/min (A) (by C-G formula based on SCr of 1.27 mg/dL (H)). Liver Function Tests: Recent Labs  Lab 09/01/19 2325  AST 36  ALT 63*  ALKPHOS 122  BILITOT 0.7  PROT 7.3  ALBUMIN 3.4*   No results for input(s): LIPASE, AMYLASE in the last 168 hours. No results for input(s): AMMONIA in the last 168 hours. Coagulation Profile: No results for input(s): INR, PROTIME in the last 168 hours. Cardiac Enzymes: No results for input(s): CKTOTAL, CKMB, CKMBINDEX, TROPONINI in the last 168 hours. BNP (last 3 results) No results for input(s): PROBNP in the last 8760 hours. HbA1C: No results for input(s): HGBA1C in the last 72 hours. CBG: Recent Labs  Lab 09/02/19 2340 09/03/19 0605 09/03/19 1215 09/04/19 0014 09/04/19 0544  GLUCAP 96 89 80 77 74   Lipid Profile: No results for input(s): CHOL, HDL, LDLCALC, TRIG, CHOLHDL, LDLDIRECT in the last 72 hours. Thyroid Function Tests: No results for input(s): TSH, T4TOTAL, FREET4, T3FREE, THYROIDAB in the last 72 hours. Anemia Panel: No results for input(s): VITAMINB12, FOLATE, FERRITIN, TIBC, IRON, RETICCTPCT in the last 72 hours. Sepsis Labs: No results for input(s): PROCALCITON, LATICACIDVEN in the last 168 hours.  Recent Results (from the past 240  hour(s))  Respiratory Panel by RT PCR (Flu A&B, Covid) - Nasopharyngeal Swab     Status: None   Collection Time: 08/26/19 11:48 AM   Specimen: Nasopharyngeal Swab  Result Value Ref Range Status   SARS Coronavirus 2 by RT PCR NEGATIVE NEGATIVE Final    Comment: (NOTE) SARS-CoV-2 target nucleic acids are NOT DETECTED.  The SARS-CoV-2 RNA is generally detectable in upper respiratoy specimens during the acute phase of infection. The lowest concentration of SARS-CoV-2 viral copies this assay can detect is 131 copies/mL. A negative result does not preclude SARS-Cov-2 infection and should not be used as the sole basis for treatment or other patient management decisions. A negative result may occur with  improper specimen collection/handling, submission of specimen other than nasopharyngeal swab, presence of viral mutation(s) within the areas targeted by this assay, and inadequate number of viral copies (<131 copies/mL). A negative result must be combined with clinical observations, patient history, and epidemiological information. The expected result is Negative.  Fact Sheet for Patients:  PinkCheek.be  Fact Sheet for Healthcare Providers:  GravelBags.it  This test is no t yet approved or cleared by the [place] FDA and  has been authorized for detection and/or diagnosis of SARS-CoV-2 by FDA under an Emergency Use Authorization (EUA). This EUA will remain  in effect (meaning this test can be used) for the duration of the COVID-19 declaration under Section 564(b)(1) of the Act, 21 U.S.C. section 360bbb-3(b)(1), unless the authorization is terminated or revoked sooner.     Influenza A by PCR NEGATIVE NEGATIVE Final   Influenza B by PCR NEGATIVE NEGATIVE Final    Comment: (NOTE) The Xpert Xpress SARS-CoV-2/FLU/RSV assay is intended as an aid in  the diagnosis of influenza from Nasopharyngeal swab specimens and  should not  be used as a sole basis for treatment. Nasal washings and  aspirates are unacceptable for Xpert Xpress SARS-CoV-2/FLU/RSV  testing.  Fact Sheet for Patients: PinkCheek.be  Fact Sheet for Healthcare Providers: GravelBags.it  This test is not yet approved or cleared by the [place] FDA and  has been authorized for detection and/or diagnosis of SARS-CoV-2 by  FDA under an Emergency Use Authorization (EUA). This EUA will  remain  in effect (meaning this test can be used) for the duration of the  Covid-19 declaration under Section 564(b)(1) of the Act, 21  U.S.C. section 360bbb-3(b)(1), unless the authorization is  terminated or revoked. Performed at [facility], [place] [address], [place], [place] [address]   SARS Coronavirus 2 by RT PCR (hospital order, performed in [facility] hospital lab) Nasopharyngeal Nasopharyngeal Swab     Status: None   Collection Time: 09/02/19  1:21 AM   Specimen: Nasopharyngeal Swab  Result Value Ref Range Status   SARS Coronavirus 2 NEGATIVE NEGATIVE Final    Comment: (NOTE) SARS-CoV-2 target nucleic acids are NOT DETECTED.  The SARS-CoV-2 RNA is generally detectable in upper and lower respiratory specimens during the acute phase of infection. The lowest concentration of SARS-CoV-2 viral copies this assay can detect is 250 copies / mL. A negative result does not preclude SARS-CoV-2 infection and should not be used as the sole basis for treatment or other patient management decisions.  A negative result may occur with improper specimen collection / handling, submission of specimen other than nasopharyngeal swab, presence of viral mutation(s) within the areas targeted by this assay, and inadequate number of viral copies (<250 copies / mL). A negative result must be combined with clinical observations, patient history, and epidemiological information.  Fact Sheet for Patients:     StrictlyIdeas.no  Fact Sheet for Healthcare Providers: BankingDealers.co.za  This test is not yet approved or  cleared by the [place] FDA and has been authorized for detection and/or diagnosis of SARS-CoV-2 by FDA under an Emergency Use Authorization (EUA).  This EUA will remain in effect (meaning this test can be used) for the duration of the COVID-19 declaration under Section 564(b)(1) of the Act, 21 U.S.C. section 360bbb-3(b)(1), unless the authorization is terminated or revoked sooner.  Performed at [facility], [place] [address] [place], [place] [address]       Radiology Studies: No results found.   LOS: 2 days   Antonieta Pert, MD [facility]  09/04/2019, 9:07 AM

## 2019-09-04 NOTE — Op Note (Signed)
[facility] Patient Name: Stephen Copeland Procedure Date: 09/04/2019 MRN: [id number] Attending MD: Ladene Artist , MD Date of Birth: 20-May-1940 CSN: [id number] Age: 79 Admit Type: Inpatient Procedure:                Colonoscopy Indications:              Hematochezia Providers:                Pricilla Riffle. Fuller Plan, MD, Wynonia Sours, RN, Lazaro Arms, Technician, Tyrone Apple, Technician,                            Eliberto Ivory, CRNA Referring MD:             [facility] Medicines:                Monitored Anesthesia Care Complications:            No immediate complications. Estimated blood loss:                            None. Estimated Blood Loss:     Estimated blood loss: none. Procedure:                Pre-Anesthesia Assessment:                           - Prior to the procedure, a History and Physical                            was performed, and patient medications and                            allergies were reviewed. The patient's tolerance of                            previous anesthesia was also reviewed. The risks                            and benefits of the procedure and the sedation                            options and risks were discussed with the patient.                            All questions were answered, and informed consent                            was obtained. Prior Anticoagulants: The patient has                            taken Plavix (clopidogrel), last dose was 1 day                            prior to procedure. ASA Grade Assessment:  III - A                            patient with severe systemic disease. After                            reviewing the risks and benefits, the patient was                            deemed in satisfactory condition to undergo the                            procedure.                           After obtaining informed consent, the colonoscope                            was passed under  direct vision. Throughout the                            procedure, the patient's blood pressure, pulse, and                            oxygen saturations were monitored continuously. The                            [id number]-HQ190L ([id number]) Olympus colonoscope was                            introduced through the anus and advanced to the the                            cecum, identified by appendiceal orifice and                            ileocecal valve. The ileocecal valve, appendiceal                            orifice, and rectum were photographed. The quality                            of the bowel preparation was inadequate especially                            in the sigmoid colon and rectum. The colonoscopy                            was performed without difficulty. Unable to safely                            retroflex with large amount of solid stool in the  rectum. The patient tolerated the procedure well. Scope In: 10:13:35 AM Scope Out: 10:27:01 AM Scope Withdrawal Time: 0 hours 9 minutes 48 seconds  Total Procedure Duration: 0 hours 13 minutes 26 seconds  Findings:      The perianal and digital rectal examinations were normal.      Multiple medium-mouthed diverticula were found in the right colon. There       was no evidence of diverticular bleeding.      Multiple medium-mouthed diverticula were found in the left colon. There       was narrowing of the colon in association with the diverticular opening.       There was evidence of diverticular spasm. There was no evidence of       diverticular bleeding.      Multiple small localized angioectasias without bleeding were found in       the rectum c/w radiation proctitis.      The exam was otherwise without abnormality on direct views. Impression:               - Preparation of the colon was inadequate.                           - Moderate diverticulosis in the right colon. There                             was no evidence of diverticular bleeding.                           - Severe diverticulosis in the left colon. There                            was no evidence of diverticular bleeding.                           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                           - The examination was otherwise normal on direct                            views.                           - No specimens collected. Moderate Sedation:      Not Applicable - Patient had care per Anesthesia. Recommendation:           - Resume Plavix (clopidogrel) in 2 days at prior                            dose. Refer to managing physician for further                            adjustment of therapy.                           -  Patient has a contact number available for                            emergencies. The signs and symptoms of potential                            delayed complications were discussed with the                            patient. Return to normal activities tomorrow.                            Written discharge instructions were provided to the                            patient.                           - Resume previous diet.                           - Continue present medications.                           - Bleeding from either diverticulosis or radiation                            proctitis. Procedure Code(s):        --- Professional ---                           [phone], Colonoscopy, flexible; diagnostic, including                            collection of specimen(s) by brushing or washing,                            when performed (separate procedure) Diagnosis Code(s):        --- Professional ---                           K55.20, Angiodysplasia of colon without hemorrhage                           K92.1, Melena (includes Hematochezia)                           K57.30, Diverticulosis of large intestine without                             perforation or abscess without bleeding CPT copyright 2019 American Medical Association. All rights reserved. The codes documented in this report are preliminary and upon coder review may  be revised to meet current compliance requirements. Ladene Artist, MD 09/04/2019 10:41:30 AM This report has been signed electronically. Number of Addenda: 0

## 2019-09-04 NOTE — Progress Notes (Signed)
Nurse tech [place] informed me that patient blood sugar was 65. Gave orange juice and chicken broth rechecked patient blood sugar about 30 mins after eating a little bit and blood sugar was 70 and then 10 mins later it was 73.

## 2019-09-04 NOTE — Anesthesia Preprocedure Evaluation (Signed)
Anesthesia Evaluation  Patient identified by MRN, date of birth, ID band Patient awake    Reviewed: Allergy & Precautions, NPO status , Patient's Chart, lab work & pertinent test results, reviewed documented beta blocker date and time   Airway Mallampati: II  TM Distance: >3 FB Neck ROM: Full    Dental  (+) Teeth Intact, Dental Advisory Given, Poor Dentition   Pulmonary pneumonia, resolved,  Intubated 5/6 for code stroke- remained intubated for 2d d/t AHRF likely 2/2 aspiration. Extubated 07/11/19   Pulmonary exam normal breath sounds clear to auscultation       Cardiovascular hypertension, Pt. on medications and Pt. on home beta blockers + CAD and +CHF  + dysrhythmias Atrial Fibrillation  Rhythm:Irregular Rate:Tachycardia  Dilated Cardiomyopathy   Echo 07/10/19:  1. Left ventricular ejection fraction, by estimation, is 35 to 40%. The left ventricle has moderately decreased function. The left ventricle demonstrates global hypokinesis. There is mild concentric left ventricular hypertrophy. Left ventricular diastolic function could not be evaluated.  2. Right ventricular systolic function is normal. The right ventricular size is normal. There is normal pulmonary artery systolic pressure.  3. The mitral valve was not well visualized. No evidence of mitral valve regurgitation.  4. The aortic valve was not well visualized. Aortic valve regurgitation is not visualized.   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 embolic secondary to known AF even on Eliquis  follows simple commands without aphasia but with dysarthria and perseveration, left lower facial weakness however he reports sensation is intact in his face, right side 5 out of 5. left side with mild  hemiparesis. CVA, Residual Symptoms negative psych ROS   GI/Hepatic Neg liver ROS, hiatal hernia, PUD, GERD  Medicated and Controlled,Dysphagia  Hx/o PEG insertion S/P hiatal hernia repair   Endo/Other  Hyperlipidemia  Renal/GU ARF and CRFRenal diseaseCr 1.29, AKI on CKD with this admission for stroke    Hx prostate Ca negative genitourinary   Musculoskeletal Scoliosis   Abdominal Normal abdominal exam  (+)   Peds  Hematology  (+) anemia , Plavix therapy- last dose 6/29   Anesthesia Other Findings Breathing intermittently labored, maintains SpO2  Reproductive/Obstetrics negative OB ROS                             Anesthesia Physical  Anesthesia Plan  ASA: III  Anesthesia Plan: MAC   Post-op Pain Management:    Induction: Intravenous  PONV Risk Score and Plan: Propofol infusion and TIVA  Airway Management Planned: Natural Airway, Nasal Cannula and Simple Face Mask  Additional Equipment: None  Intra-op Plan:   Post-operative Plan:   Informed Consent: I have reviewed the patients History and Physical, chart, labs and discussed the procedure including the risks, benefits and alternatives for the proposed anesthesia with the patient or authorized representative who has indicated his/her understanding and acceptance.     Dental advisory given  Plan Discussed with: CRNA  Anesthesia Plan Comments:         Anesthesia Quick Evaluation

## 2019-09-04 NOTE — Progress Notes (Signed)
   09/04/19 1600  Clinical Encounter Type  Visited With Patient and family together  Visit Type Initial;Psychological support;Spiritual support  Referral From Nurse  Consult/Referral To Chaplain  Spiritual Encounters  Spiritual Needs Prayer;Emotional;Other (Comment) (Spiritual Care Conversation/Support)  Stress Factors  Patient Stress Factors Health changes;Lack of knowledge  Family Stress Factors Health changes;Lack of knowledge    I visited with Bessie and his wife Delia per referral from the nurse. Ty was very anxious when I arrived over lack of understanding his medical treatment, feeling like medical care providers are contradicting each other and him wanting to go home. I provided a calming presence and prayer shawl to [place]. Renzo and Delia shared about there life together, nearly 101 years.  Ken' main goal is to be able to go back home. He wanted me to make sure that his nurse knows this.   We will continue to follow up with Jeneen Rinks and his wife. Delia stated that having a Chaplain visit really helps [place] calm down and it means a lot to them.   Please, contact Spiritual Care for further assistance.   Chaplain Shanon Ace M.Div., [facility]

## 2019-09-04 NOTE — Progress Notes (Signed)
OT Cancellation Note  Patient Details Name: Stephen Copeland MRN: [id number] DOB: [birth year]   Cancelled Treatment:    Reason Eval/Treat Not Completed: Patient declined, no reason specified;Other (comment) (Pt off floor this AM for colonoscopy.  Attempted again in PM and pt adamantly refused to participate due to "I'm irritable" and "I just had surgery."  Pt encouraged and continued to refuse.)  Julien Girt 09/04/2019, 1:46 PM

## 2019-09-04 NOTE — Care Management Important Message (Signed)
Important Message  Patient Details IM Letter presented to the Patient Name: Stephen Copeland MRN: [id number] Date of Birth: 03/31/1940   Medicare Important Message Given:  Yes     Kerin Salen 09/04/2019, 10:02 AM

## 2019-09-04 NOTE — Transfer of Care (Signed)
Immediate Anesthesia Transfer of Care Note  Patient: Stephen Copeland  Procedure(s) Performed: Procedure(s): COLONOSCOPY WITH PROPOFOL (N/A) BIOPSY  Patient Location: PACU and Endoscopy Unit  Anesthesia Type:MAC  Level of Consciousness: awake, alert  and oriented  Airway & Oxygen Therapy: Patient Spontanous Breathing and Patient connected to nasal cannula oxygen  Post-op Assessment: Report given to RN and Post -op Vital signs reviewed and stable  Post vital signs: Reviewed and stable  Last Vitals:  Vitals:   09/04/19 0944 09/04/19 1034  BP: (!) 182/87 (!) (P) 145/73  Pulse: 90   Resp: (!) 25   Temp: 37.1 C   SpO2: 64%     Complications: No apparent anesthesia complications

## 2019-09-04 NOTE — Anesthesia Postprocedure Evaluation (Signed)
Anesthesia Post Note  Patient: Stephen Copeland  Procedure(s) Performed: COLONOSCOPY WITH PROPOFOL (N/A ) BIOPSY     Patient location during evaluation: Endoscopy Anesthesia Type: MAC Level of consciousness: awake Pain management: pain level controlled Vital Signs Assessment: post-procedure vital signs reviewed and stable Respiratory status: spontaneous breathing Cardiovascular status: stable Postop Assessment: no apparent nausea or vomiting Anesthetic complications: no   No complications documented.  Last Vitals:  Vitals:   09/04/19 1034 09/04/19 1040  BP: (!) 145/73 (!) 151/43  Pulse: 95 94  Resp: 15 (!) 24  Temp:    SpO2: 100% 98%    Last Pain:  Vitals:   09/04/19 1040  TempSrc:   PainSc: 0-No pain                 Huston Foley

## 2019-09-04 NOTE — Interval H&P Note (Signed)
History and Physical Interval Note:  09/04/2019 9:58 AM  Stephen Copeland  has presented today for surgery, with the diagnosis of bloody stool.  The various methods of treatment have been discussed with the patient and family. After consideration of risks, benefits and other options for treatment, the patient has consented to  Procedure(s): COLONOSCOPY WITH PROPOFOL (N/A) as a surgical intervention.  The patient's history has been reviewed, patient examined, no change in status, stable for surgery.  I have reviewed the patient's chart and labs.  Questions were answered to the patient's satisfaction.     Pricilla Riffle. Fuller Plan

## 2019-09-04 NOTE — Progress Notes (Signed)
PT Cancellation Note  Patient Details Name: Stephen Copeland MRN: [id number] DOB: 04-16-1940   Cancelled Treatment:    Reason Eval/Treat Not Completed: Patient at procedure or test/unavailable Transporter just arrived for colonoscopy this morning.  Will check back as schedule permits.   Sharada Albornoz,KATHrine E 09/04/2019, 9:30 AM Arlyce Dice, DPT Acute Rehabilitation Services Pager: [phone] Office: [phone]

## 2019-09-04 NOTE — Progress Notes (Signed)
Chaplain provided support at bedside in response to RN referral.  Pt welcoming of chaplain presence. Stephen Copeland has some context for chaplain support from his career in [employer].  During conversation, his mood fluctuated quickly between happy and tearful,   Spoke with chaplain about frustration around not being able to receive colonoscopy today, as well as frustration with hospitalization in general.  In speaking about his time in [employer], we identified that he is a very resourceful man and is used to being able to use his resources to get things accomplished.  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  Chaplain provided support around this aspect of his identity and ways that hospitalization can be frustrating.   Stephen Copeland is supported by his spouse, Stephen Copeland, 39.  He is worried about her being at home alone.  A neighbor is checking on her and helping her get to appointments.  She called during our encounter.    Will follow with Stephen Copeland for continued support during this hospitalization with goal of supporting his resilience and connecting him with values.

## 2019-09-05 ENCOUNTER — Inpatient Hospital Stay (HOSPITAL_COMMUNITY): Payer: Medicare Other

## 2019-09-05 DIAGNOSIS — K627 Radiation proctitis: Principal | ICD-10-CM

## 2019-09-05 LAB — GLUCOSE, CAPILLARY
Glucose-Capillary: 102 mg/dL — ABNORMAL HIGH (ref 70–99)
Glucose-Capillary: 79 mg/dL (ref 70–99)
Glucose-Capillary: 88 mg/dL (ref 70–99)
Glucose-Capillary: 90 mg/dL (ref 70–99)
Glucose-Capillary: 94 mg/dL (ref 70–99)

## 2019-09-05 LAB — BASIC METABOLIC PANEL
Anion gap: 9 (ref 5–15)
BUN: 26 mg/dL — ABNORMAL HIGH (ref 8–23)
CO2: 23 mmol/L (ref 22–32)
Calcium: 8.8 mg/dL — ABNORMAL LOW (ref 8.9–10.3)
Chloride: 109 mmol/L (ref 98–111)
Creatinine, Ser: 1.18 mg/dL (ref 0.61–1.24)
GFR calc Af Amer: >60 mL/min (ref >60)
GFR calc non Af Amer: 59 mL/min — ABNORMAL LOW (ref >60)
Glucose, Bld: 98 mg/dL (ref 70–99)
Potassium: 3.7 mmol/L (ref 3.5–5.1)
Sodium: 141 mmol/L (ref 135–145)

## 2019-09-05 LAB — CBC
HCT: 31.3 % — ABNORMAL LOW (ref 39.0–52.0)
Hemoglobin: 9.6 g/dL — ABNORMAL LOW (ref 13.0–17.0)
MCH: 30.3 pg (ref 26.0–34.0)
MCHC: 30.7 g/dL (ref 30.0–36.0)
MCV: 98.7 fL (ref 80.0–100.0)
Platelets: 312 10*3/uL (ref 150–400)
RBC: 3.17 MIL/uL — ABNORMAL LOW (ref 4.22–5.81)
RDW: 17 % — ABNORMAL HIGH (ref 11.5–15.5)
WBC: 6.6 10*3/uL (ref 4.0–10.5)
nRBC: 0 % (ref 0.0–0.2)

## 2019-09-05 NOTE — TOC Progression Note (Signed)
Transition of Care [facility]) - Progression Note    Patient Details  Name: Giannis Corpuz MRN: [id number] Date of Birth: Jul 12, 1940  [facility] CM/SW Contact  Lennart Pall, LCSW Phone Number: 09/05/2019, 11:57 AM  Clinical Narrative:   Met with pt to review dc plans.  Pt confirms that he was readmitted to hospital from [facility].  Has many frustrations about the facility, therapy, his current level of weakness, his perceived lack of attention from staff.  Allowed him to ventilate but did try to confirm plan for return to SNF.  He states I will need to speak with is wife.  Have left a VM for her and have explained on message that will assume that plan is for return to [place] but would like to confirm.  TOC will continue to follow.    Expected Discharge Plan: [place] Barriers to Discharge: Continued Medical Work up  Expected Discharge Plan and Services Expected Discharge Plan: [place] In-house Referral: Clinical Social Work   Post Acute Care Choice: [place] Living arrangements for the past 2 months: [place] (from [facility])                 DME Arranged: N/A DME Agency: NA       HH Arranged: NA HH Agency: NA         Social Determinants of Health (SDOH) Interventions    Readmission Risk Interventions No flowsheet data found.

## 2019-09-05 NOTE — Progress Notes (Signed)
° ° ° °  Progress Note  CC:    Rectal bleeding      ASSESSMENT AND PLAN:    79 yo male with PMH significant for but no limited to Afib, ischemic cardiomyopathy, CVA on plavix.  neurogenic dysphagia with G tube, choledocholithiasis, diverticulosis.    # Rectal bleeding on plavix. Resolved --Probably secondary to radiation proctitis or a diverticular hemorrhage.  --Hgb stable overnight at 9.6. He hasn't required blood transfusion --Can resume Plavix tomorrow if desired --GI will sign off, call with questions.    # Asymptomatic choledocholithiasis, chronic ( 2018).  --No evidence for obstructive process base on labs. No abdominal pain.      09/04/19 colonoscopy -Preparation of the colon was inadequate. - Moderate diverticulosis in the right colon. There was no evidence of diverticular bleeding. - Severe diverticulosis in the left colon. There was no evidence of diverticular bleeding. 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 - The examination was otherwise normal on direct views. - No specimens collected.   SUBJECTIVE    Hasn't been sleeping well but otherwise feels okay. Wants to eat but refusing SLP evaluation /  therapy to assess ability to tolerate full liquids.    OBJECTIVE:     Vital signs in last 24 hours: Temp:  [97.4 F (36.3 C)-98.7 F (37.1 C)] 98 F (36.7 C) (07/03 0440) Pulse Rate:  [68-97] 68 (07/03 0440) Resp:  [15-25] 16 (07/03 0440) BP: (137-182)/(43-98) 153/88 (07/03 0440) SpO2:  [94 %-100 %] 97 % (07/03 0440) Weight:  [73.9 kg] 73.9 kg (07/02 0944) Last BM Date: 09/04/19 General:   Alert, in NAD Heart:  Regular rate. No lower extremity edema   Pulm: Normal respiratory effort   Abdomen:  Soft,  nontender, nondistended.  Normal bowel sounds.          Psych:  Pleasant, talkative,  cooperative.     Intake/Output from previous day: 07/02 0701 - 07/03 0700 In: 1146.4 [I.V.:1146.4] Out: 600  [Urine:600] Intake/Output this shift: No intake/output data recorded.  Lab Results: Recent Labs    09/03/19 0802 09/04/19 0334 09/05/19 0333  WBC 8.9 6.6 6.6  HGB 10.7* 9.6* 9.6*  HCT 34.7* 31.4* 31.3*  PLT 403* 336 312   BMET Recent Labs    09/04/19 0334 09/05/19 0333  NA 141 141  K 3.7 3.7  CL 109 109  CO2 21* 23  GLUCOSE 84 98  BUN 30* 26*  CREATININE 1.27* 1.18  CALCIUM 8.5* 8.8*    Principal Problem:   Lower GI bleed Active Problems:   Paroxysmal atrial fibrillation (HCC)   Congestive dilated cardiomyopathy (HCC)   History of prostate cancer   SAH (subarachnoid hemorrhage) (HCC) s/p clot retrieval and stent placement   Hematochezia     LOS: 3 days   Tye Savoy ,NP 09/05/2019, 9:00 AM

## 2019-09-05 NOTE — Evaluation (Signed)
Occupational Therapy Evaluation Patient Details Name: Kinsey Cowsert MRN: [id number] DOB: 02-09-1941 Today's Date: 09/05/2019    History of Present Illness Daanish Copes is a 79 y.o. male PMH of Afib, systolic CHF, HLD, CAD, HTN, prostate cancer, who was recently admitted and discharged 08/26/19 after having a complicated course after patient was diagnosed with stroke on 07/09/19 and underwent intervention stent placement for stroke. Hospital stay complicated with intracranial hemorrhage and respiratory failure requiring intubation secondary to aspiration pneumonia. Pt admitted 09/01/19 for acute GI bleed after presenting to [facility] from [facility] for acute rectal bleed.   Clinical Impression   Mr. Kuzey Ogata is a 79 year old man who presents with generalized weakness, decreased activity tolerance, impaired sitting and standing balance, cognitive deficits and emotional lability resulting in impaired ability to perform functional mobility and ADLs. Patient predominantly agitated and stating he is going home, at times angry and then crying, has poor insight into deficits, impulsivity and poor safety awareness. Patient mod assist to transfer into sitting, min assist to sit at edge of bed, max assist for lower body ADLs and total assist x 2 for toileting and transfers. Patient will benefit from skilled OT services to improve deficits and learn compensatory strategies as needed in order to improve functional abilities in order to return home. At this time patient continues to need rehab at discharge and is too unsafe to return home. If patient adamant about going home - would need 24/7 caregiver assistance.    Follow Up Recommendations  SNF    Equipment Recommendations   (Defer to next venue)    Recommendations for Other Services       Precautions / Restrictions Precautions Precautions: Fall Precaution Comments: PEG, impulsive Restrictions Weight Bearing Restrictions: No      Mobility Bed  Mobility Overal bed mobility: Needs Assistance Bed Mobility: Supine to Sit     Supine to sit: Min assist;HOB elevated;Mod assist     General bed mobility comments: VC's required to sequence LE mobility to EOB and cue to use Rt UE on bed rail to pivot and raise trunk upright. Min assist to raise trunk and Mod assist to scoot forward to EOB.  Transfers Overall transfer level: Needs assistance Equipment used: Rolling walker (2 wheeled) Transfers: Sit to/from [employer] Sit to Stand: Min assist;+2 physical assistance;+2 safety/equipment;From elevated surface Stand pivot transfers: Mod assist;+2 physical assistance;+2 safety/equipment;From elevated surface;Max assist       General transfer comment: +2 assist required for safety with rise and use of RW. Min assist from elevated surface for power up and to steady in standing. Pt impulsive and attempting to move to chair with poor stability. Pt required mod assist +2 to prevent LOB, manage RW, and sequence steps. Pt with unsafe drop to chair requiring Max assist to guide hips to seat to prevent fall. Patient VERY HIGH FALL RISK. Overall poor awareness of deficits.     Balance Overall balance assessment: Needs assistance Sitting-balance support: Feet supported;Bilateral upper extremity supported Sitting balance-Leahy Scale: Fair Sitting balance - Comments: min guard to min assist for EOB balance.   Standing balance support: Bilateral upper extremity supported Standing balance-Leahy Scale: Poor Standing balance comment: HEAVY reliance on UE support for balance.                            ADL either performed or assessed with clinical judgement   ADL Overall ADL's : Needs assistance/impaired Eating/Feeding: NPO  Grooming: Set up;Sitting;Wash/dry face Grooming Details (indicate cue type and reason): patient washed face and ran wash cloth through his hair seated in recliner. Upper Body Bathing: Sitting;Cueing  for sequencing;Minimal assistance Upper Body Bathing Details (indicate cue type and reason): Needs back to be supported in seated position. Lower Body Bathing: Maximal assistance;+2 for safety/equipment;Sitting/lateral leans;Set up   Upper Body Dressing : Moderate assistance;Sitting;Set up   Lower Body Dressing: +2 for safety/equipment;+2 for physical assistance;Maximal assistance;Sit to/from stand Lower Body Dressing Details (indicate cue type and reason): Patient needed assistance to donn socks seated at side of bed - though falling over. Needing assistance to maintain balance and problem solve to get sock fully on when stuck. Patient needs +2 for standing ADLs. Toilet Transfer: +2 for safety/equipment;+2 for physical assistance;Stand-pivot;BSC Toilet Transfer Details (indicate cue type and reason): simulated via transfer to [place] and Hygiene: Total assistance;Sit to/from Nurse, children's Details (indicate cue type and reason): n/a Functional mobility during ADLs: Maximal assistance;+2 for physical assistance;+2 for safety/equipment       Vision Patient Visual Report: No change from baseline       Perception     Praxis      Pertinent Vitals/Pain Pain Assessment: No/denies pain Faces Pain Scale: No hurt Pain Intervention(s): Limited activity within patient's tolerance;Monitored during session     Hand Dominance Right   Extremity/Trunk Assessment Upper Extremity Assessment Upper Extremity Assessment: Defer to OT evaluation RUE Deficits / Details: strength 4/5 MM grade LUE Deficits / Details: strength 4/5 MM grade   Lower Extremity Assessment Lower Extremity Assessment: Defer to PT evaluation RLE Deficits / Details: pt can rasie against partial gravity, 3-/5 for hip flex, knee ext, ankle dorsiflexion LLE Deficits / Details: Lt LE slightly weaker than Rt, can raise hip against partial gravisty, 2+ for hip flexion, 2+ for knee  extension, 3- for ankle dorsiflexion.   Cervical / Trunk Assessment Cervical / Trunk Assessment: Kyphotic Cervical / Trunk Exceptions: chart lists h/o scoliosis   Communication     Cognition Arousal/Alertness: Awake/alert Behavior During Therapy: Agitated;Impulsive;Restless Overall Cognitive Status: No family/caregiver present to determine baseline cognitive functioning Area of Impairment: Safety/judgement;Problem solving                   Current Attention Level: Sustained Memory: Decreased recall of precautions;Decreased short-term memory Following Commands: Follows one step commands inconsistently;Follows one step commands with increased time Safety/Judgement: Decreased awareness of safety;Decreased awareness of deficits Awareness: Intellectual Problem Solving: Requires verbal cues;Requires tactile cues;Difficulty sequencing General Comments: Pt becoming easily agitated while resting in bed and discussing plans to leave and go home. Patient impulsive with attempting sit to stand and stand step/pivot using RW. Overall very poor safety awareness and awareness of deficits, pt atempts all mobility before staff is ready. Patient is also emotional labile.   General Comments       Exercises     Shoulder Instructions      Home Living Family/patient expects to be discharged to:: Skilled nursing facility Living Arrangements: Spouse/significant other Available Help at Discharge: Family Type of Home: House Home Access: Level entry     Home Layout: One level     Bathroom Shower/Tub: Tub only   Biochemist, clinical: Standard Bathroom Accessibility: No   Home Equipment: Marine scientist - single point   Additional Comments: home environment from chart/prior admission  Lives With: Spouse    Prior Functioning/Environment Level of Independence: Needs assistance  Comments: Prior to CVA in May 2021 pt was independent. pt has been non-ambulatory since and participating in  Rehab at [facility].         OT Problem List: Decreased strength;Decreased range of motion;Decreased activity tolerance;Impaired balance (sitting and/or standing);Decreased cognition;Decreased safety awareness;Decreased knowledge of use of DME or AE;Decreased knowledge of precautions;Pain      OT Treatment/Interventions: Self-care/ADL training;Therapeutic exercise;Energy conservation;DME and/or AE instruction;Therapeutic activities;Patient/family education;Cognitive remediation/compensation;Balance training    OT Goals(Current goals can be found in the care plan section) Acute Rehab OT Goals Patient Stated Goal: Wants to go home OT Goal Formulation: With patient Time For Goal Achievement: 09/19/19 Potential to Achieve Goals: Fair  OT Frequency: Min 2X/week   Barriers to D/C:            Co-evaluation PT/OT/SLP Co-Evaluation/Treatment: Yes Reason for Co-Treatment: Necessary to address cognition/behavior during functional activity;For patient/therapist safety;To address functional/ADL transfers PT goals addressed during session: Mobility/safety with mobility OT goals addressed during session: ADL's and self-care      AM-PAC OT "6 Clicks" Daily Activity     Outcome Measure Help from another person eating meals?: A Little Help from another person taking care of personal grooming?: A Little Help from another person toileting, which includes using toliet, bedpan, or urinal?: Total Help from another person bathing (including washing, rinsing, drying)?: A Lot Help from another person to put on and taking off regular upper body clothing?: A Little Help from another person to put on and taking off regular lower body clothing?: A Lot 6 Click Score: 14   End of Session Equipment Utilized During Treatment: Gait belt;Rolling walker Nurse Communication: Mobility status  Activity Tolerance: Treatment limited secondary to agitation Patient left: with call bell/phone within reach;with bed alarm  set;in chair;with chair alarm set  OT Visit Diagnosis: Unsteadiness on feet (R26.81);Other abnormalities of gait and mobility (R26.89);Muscle weakness (generalized) (M62.81);Other symptoms and signs involving cognitive function                Time: 1012-1040 OT Time Calculation (min): 28 min Charges:  OT General Charges $OT Visit: 1 Visit OT Evaluation $OT Eval Moderate Complexity: 1 Mod  Kippy Gohman, OTR/L [place]  Office [phone] Pager: [place] 09/05/2019, 1:38 PM

## 2019-09-05 NOTE — Evaluation (Signed)
Physical Therapy Evaluation Patient Details Name: Stephen Copeland MRN: [id number] DOB: November 13, 1940 Today's Date: 09/05/2019   History of Present Illness  Stephen Copeland is a 79 y.o. male PMH of Afib, systolic CHF, HLD, CAD, HTN, prostate cancer, who was recently admitted and discharged 08/26/19 after having a complicated course after patient was diagnosed with stroke on 07/09/19 and underwent intervention stent placement for stroke. Hospital stay complicated with intracranial hemorrhage and respiratory failure requiring intubation secondary to aspiration pneumonia. Pt admitted 09/01/19 for acute GI bleed after presenting to [facility] from [facility] for acute rectal bleed.    Clinical Impression  Stephen Copeland is 79 y.o. male admitted from SNF [facility]) with above HPI and diagnosis. Patient with history significant for CVA in May 2021 and has been non-ambulatory since. Patient is currently limited by functional impairments below (see PT problem list). Patient currently requires +2 assist for safety with functional transfers today. He require min assist for power up to stand and Mod-Max assist for stand step transfer with RW. Patient has poor safety awareness, is impulsive with mobility and remains very [place]. PTA in May he was living at home with wife and does not have any other family or aids in place to assist with mobility or ADLs at home. Patient will benefit from continued skilled PT interventions to address impairments and progress independence with mobility, recommending return to SNF for follow up therapy and 24/7 assist. Acute PT will follow and progress as able.     Follow Up Recommendations SNF;Supervision/Assistance - 24 hour    Equipment Recommendations  None recommended by PT (defer to facility)    Recommendations for Other Services       Precautions / Restrictions Precautions Precautions: Fall Restrictions Weight Bearing Restrictions: No      Mobility  Bed  Mobility Overal bed mobility: Needs Assistance Bed Mobility: Supine to Sit     Supine to sit: Min assist;HOB elevated;Mod assist     General bed mobility comments: VC's required to sequence LE mobility to EOB and cue to use Rt UE on bed rail to pivot and raise trunk upright. Min assist to raise trunk and Mod assist to scoot forward to EOB.  Transfers Overall transfer level: Needs assistance Equipment used: Rolling walker (2 wheeled) Transfers: Sit to/from [employer] Sit to Stand: Min assist;+2 physical assistance;+2 safety/equipment;From elevated surface Stand pivot transfers: Mod assist;+2 physical assistance;+2 safety/equipment;From elevated surface;Max assist       General transfer comment: +2 assist required for safety with rise and use of RW. Min assist from elevated surface for power up and to steady in standing. Pt impulsive and attempting to move to chair with poor stability. Pt required mod assist +2 to prevent LOB, manage RW, and sequence steps. Pt with unsafe drop to chair requiring Max assist to guide hips to seat to prevent fall. Patient VERY HIGH FALL RISK. Overall poor awareness of deficits.   Ambulation/Gait         Stairs     Wheelchair Mobility    Modified Rankin (Stroke Patients Only)       Balance Overall balance assessment: Needs assistance Sitting-balance support: Feet supported;Bilateral upper extremity supported Sitting balance-Leahy Scale: Fair Sitting balance - Comments: min guard to min assist for EOB balance.   Standing balance support: Bilateral upper extremity supported Standing balance-Leahy Scale: Poor Standing balance comment: HEAVY reliance on UE support for balance.             Pertinent Vitals/Pain  Pain Assessment: Faces Faces Pain Scale: No hurt Pain Intervention(s): Limited activity within patient's tolerance;Monitored during session    [place] expects to be discharged to:: Skilled  nursing facility Living Arrangements: Spouse/significant other Available Help at Discharge: Family Type of Home: House Home Access: Level entry     Home Layout: One level Home Equipment: Shower seat;Cane - single point Additional Comments: home environment from chart/prior admission    Prior Function Level of Independence: Needs assistance         Comments: Prior to CVA in May 2021 pt was independent. pt has been non-ambulatory since and participating in Rehab at [facility].      Hand Dominance   Dominant Hand: Right    Extremity/Trunk Assessment   Upper Extremity Assessment Upper Extremity Assessment: Defer to OT evaluation    Lower Extremity Assessment Lower Extremity Assessment: Generalized weakness;RLE deficits/detail;LLE deficits/detail RLE Deficits / Details: pt can rasie against partial gravity, 3-/5 for hip flex, knee ext, ankle dorsiflexion LLE Deficits / Details: Lt LE slightly weaker than Rt, can raise hip against partial gravisty, 2+ for hip flexion, 2+ for knee extension, 3- for ankle dorsiflexion.    Cervical / Trunk Assessment Cervical / Trunk Assessment: Kyphotic Cervical / Trunk Exceptions: chart lists h/o scoliosis  Communication      Cognition Arousal/Alertness: Awake/alert Behavior During Therapy: Agitated;Impulsive;Restless Overall Cognitive Status: No family/caregiver present to determine baseline cognitive functioning Area of Impairment: Safety/judgement;Problem solving            Memory: Decreased recall of precautions;Decreased short-term memory Following Commands: Follows one step commands inconsistently;Follows one step commands with increased time Safety/Judgement: Decreased awareness of safety;Decreased awareness of deficits   Problem Solving: Requires verbal cues;Requires tactile cues;Difficulty sequencing General Comments: Pt becoming easily agitated while resting in bed and discussing plans to leave and go home. Patient impulsive with  attempting sit to stand and stand step/pivot using RW. Overall very poor safety awareness and awareness of deficits, pt atempts all mobility before staff is ready.      General Comments      Exercises     Assessment/Plan    PT Assessment Patient needs continued PT services  PT Problem List Decreased strength;Decreased activity tolerance;Decreased balance;Decreased mobility;Decreased coordination;Decreased cognition;Decreased knowledge of use of DME;Decreased knowledge of precautions;Decreased safety awareness;Decreased skin integrity       PT Treatment Interventions DME instruction;Gait training;Functional mobility training;Therapeutic activities;Therapeutic exercise;Balance training;Patient/family education    PT Goals (Current goals can be found in the Care Plan section)  Acute Rehab PT Goals Patient Stated Goal: "I'm going to go home" PT Goal Formulation: With patient Time For Goal Achievement: 09/19/19 Potential to Achieve Goals: Fair    Frequency Min 2X/week   Barriers to discharge Decreased caregiver support pt lives with ~33 y.o. wife and no other family or personal aids are at home to assist.    Co-evaluation PT/OT/SLP Co-Evaluation/Treatment: Yes Reason for Co-Treatment: For patient/therapist safety;To address functional/ADL transfers PT goals addressed during session: Mobility/safety with mobility;Balance;Proper use of DME         AM-PAC PT "6 Clicks" Mobility  Outcome Measure Help needed turning from your back to your side while in a flat bed without using bedrails?: A Little Help needed moving from lying on your back to sitting on the side of a flat bed without using bedrails?: A Little Help needed moving to and from a bed to a chair (including a wheelchair)?: Total Help needed standing up from a chair using your arms (e.g.,  wheelchair or bedside chair)?: A Lot Help needed to walk in hospital room?: Total Help needed climbing 3-5 steps with a railing? :  Total 6 Click Score: 11    End of Session Equipment Utilized During Treatment: Gait belt Activity Tolerance: Patient tolerated treatment well Patient left: in chair;with call bell/phone within reach;with chair alarm set Nurse Communication: Mobility status;Need for lift equipment (recommend Stedy +2 assist for NT) PT Visit Diagnosis: Muscle weakness (generalized) (M62.81);Other symptoms and signs involving the nervous system (R29.898);Hemiplegia and hemiparesis;Unsteadiness on feet (R26.81) Hemiplegia - Right/Left: Left Hemiplegia - dominant/non-dominant: Non-dominant Hemiplegia - caused by: Cerebral infarction    Time: 7282-0601 PT Time Calculation (min) (ACUTE ONLY): 32 min   Charges:   PT Evaluation $PT Eval Moderate Complexity: 1 Mod          Verner Mould, DPT Acute Rehabilitation Services  Office [phone] Pager [phone]  09/05/2019 12:10 PM

## 2019-09-05 NOTE — Progress Notes (Signed)
Modified Barium Swallow Progress Note  Patient Details  Name: Stephen Copeland MRN: [id number] Date of Birth: 11/02/1940  Today's Date: 09/05/2019  Modified Barium Swallow completed.  Full report located under Chart Review in the Imaging Section.  Brief recommendations include the following:  Clinical Impression  Patient presents with a severe oropharyngeal dysphagia characterized by decreased oral manipulation and transit of puree solids and nectar thick liquids, swallow initiation delays to vallecular sinus with nectar thick liquids and puree solid and to pyriform with thin liquids. Patient exhibited moderate amount of aspiration prior to and during swallow of cup sip of thin liquids which he did not react to until already passing through vocal cords. Patient then started to cough, gag and regurgitate violently and saying he needed oxygen. After settling down, he did accept a couple sips of nectar thick liquids which did not result in aspiration or penetration but did cause mild vallecular and pyriform sinus residuals. Patient exhibited mild-moderate amount of vallecular sinus residuals with puree solids but this did clear mostly with subsequent swallows. Patient is at a high risk of aspiration and lack of hydration/nutrition.   Swallow Evaluation Recommendations       SLP Diet Recommendations: Nectar thick liquid   Liquid Administration via: Cup   Medication Administration: Other (Comment) (crushed in puree or through PEG)   Supervision: Full supervision/cueing for compensatory strategies   Compensations: Slow rate;Small sips/bites;Minimize environmental distractions   Postural Changes: Remain semi-upright after after feeds/meals (Comment);Seated upright at 90 degrees   Oral Care Recommendations: Oral care BID;Staff/trained caregiver to provide oral care   Other Recommendations: Order thickener from pharmacy;Prohibited food (jello, ice cream, thin soups);Remove water pitcher;Have oral  suction available;Clarify dietary restrictions   Sonia Baller, MA, CCC-SLP Speech Therapy [facility] Acute Rehab

## 2019-09-05 NOTE — Progress Notes (Signed)
PROGRESS NOTE    Stephen Copeland  KVQ:[id number] DOB: Feb 22, 1941 DOA: 09/01/2019 PCP: [name]., MD   Chef Complaints: Rectal bleeding  Brief Narrative: 79 y.o. male with history of A. fib cardiomyopathy hypertension previous history of prostate cancer who was recently admitted and discharged last week after having a complicated course after patient was diagnosed with stroke underwent intervention stent placement for stroke and complicated with intracranial hemorrhage and respiratory failure requiring intubation secondary to aspiration pneumonia and during the stay patient also was noted to have bright red blood per rectum for which patient's Eliquis had to be held off and on eventually discharged with no Eliquis but Brilinta presently on Plavix for the stent was noticed to have some bleeding per rectum at the living facility..  Denies any nausea vomiting.  Patient has a PEG tube secondary to dysphagia.  Patient states over the last 3 days been eating solid food.  During the last day patient also had an EGD which did not show any acute.  ED Course: In the ER patient again was noted to have some bleeding per rectum.  Hemodynamically stable. Lab work show hemoglobin is around 1.5 increased from baseline from recent stay which was 9.  Creatinine increased from baseline around 1.4 and is around 1.5.  Covid test was negative.  Patient admitted for acute GI bleed  Subjective:  Pt is upset about NPO and not being able to eat. He has been weak and frail needing a lot of assistance to get up.  He has been reluctant to return to skilled nursing facility but he understand that he needs to return to SNF unsafe for home yet.  He has elderly wife. No more bleeding.  Assessment & Plan:  Acute GI bleeding with rectal bleeding.  On colonoscopy recent bleeding from either diverticular hemorrhage or radiation proctitis.  Okay to resume Plavix tomorrow.  H&H is stable.  GI advised to discharge on Xilin  twice daily at least 64 ounces of water daily and a high-fiber diet.  If bleeding recurs g iadvised empiric treatment for radiation proctitis with sucralfate enemas 20 mL of 10% suspension twice a day for 4 weeks or until the bleeding has stopped.    Paroxysmal atrial fibrillation:Continue metoprolol.  Plavix to resume tomorrow  Recent stroke with intervention with stent placement: Patient on Plavix.  Neurology advised Eliquis can be held but Plavix has to be continued.  If continues to have bleeding issues will need to talk with interventional neurologist Dr Alfonso Ramus regarding recommendation.  Okay to resume Plavix tomorrow.  Anemia of chronic disease hemoglobin is stable at 9 g range.  Monitor.   Metabolic acidosis -bicarb level has improved to 23.  Monitor   Dilated cardiomyopathy: Euvolemic.  Compensated.  Dysphagia PEG tube in place.  Patient reports he is taking orally as well for last 3 days PTA.  Seen by speech currently remains n.p.o. awaiting for respiratory evaluation today, patient has been upset and anxious about n.p.o. status.  Dietitian consulted can start tube feeding bolus as well.   Hypoglycemia due to n.p.o. status monitor, on D5 at 30 ml/ hr-wean off slowly. Recent Labs  Lab 09/04/19 1311 09/04/19 1716 09/05/19 0008 09/05/19 0529 09/05/19 1217  GLUCAP 73 75 90 79 94   CKD stage IIIb-baseline creatinine around 1.2-1.5.  Overall stable.  Level is at 1.1.  Monitor.  History of prostate cancer  DVT prophylaxis: SCDs Start: 09/02/19 0315 Code Status: full Family Communication: plan of care discussed with patient  at bedside.  Status is: Inpatient  Remains inpatient appropriate because:Ongoing diagnostic testing needed not appropriate for outpatient work up and Inpatient level of care appropriate due to severity of illness  Dispo: The patient is from: SNF Lives at home with 79 yo wife              Anticipated d/c is to: SNF              Anticipated d/c date is: 1  day              Patient currently is not medically stable to d/c.  Hopefully discharge to skilled nursing facility over the weekend or Monday await for speech for clearance for p.o.  Nutrition: Diet Order            Diet NPO time specified  Diet effective now                 Nutrition Problem: Inadequate oral intake Etiology: dysphagia (s/p stroke) Signs/Symptoms: NPO status Interventions: Tube feeding, Prostat Body mass index is 20.92 kg/m. Consultants: GI, neurology Procedures: Microbiology:see note  Medications: Scheduled Meds:  chlorhexidine  15 mL Mouth Rinse BID   clopidogrel  75 mg Per Tube Daily   feeding supplement (PRO-STAT SUGAR FREE 64)  30 mL Oral BID   mouth rinse  15 mL Mouth Rinse q12n4p   metoprolol tartrate  2.5 mg Intravenous Q6H   pantoprazole sodium  40 mg Per Tube Daily   Continuous Infusions:  dextrose 30 mL/hr at 09/04/19 1505   lactated ringers Stopped (09/04/19 1130)    Antimicrobials: Anti-infectives (From admission, onward)   None       Objective: Vitals: Today's Vitals   09/05/19 0030 09/05/19 0130 09/05/19 0440 09/05/19 1240  BP:   (!) 153/88 129/90  Pulse:   68 99  Resp:   16   Temp:   98 F (36.7 C)   TempSrc:      SpO2:   97%   Weight:      Height:      PainSc: 6  2       Intake/Output Summary (Last 24 hours) at 09/05/2019 1315 Last data filed at 09/05/2019 0600 Gross per 24 hour  Intake 446.39 ml  Output 600 ml  Net -153.61 ml   Filed Weights   09/02/19 0257 09/04/19 0944  Weight: 73.9 kg 73.9 kg   Weight change:    Intake/Output from previous day: 07/02 0701 - 07/03 0700 In: 1146.4 [I.V.:1146.4] Out: 600 [Urine:600] Intake/Output this shift: No intake/output data recorded.  Examination: General exam: AAO X3, appears upset, not in distress, NAD, weak appearing. HEENT:Oral mucosa moist, Ear/Nose WNL grossly, dentition normal. Respiratory system: bilaterally clear,no wheezing or crackles,no use of  accessory muscle Cardiovascular system: S1 & S2 +, No JVD,. Gastrointestinal system: Abdomen soft, NT,ND, BS+ PEG tube in place. Nervous System:Alert, awake, moving extremities and grossly nonfocal Extremities: No edema, distal peripheral pulses palpable.  Skin: No rashes,no icterus. MSK: Normal muscle bulk,tone, power  Data Reviewed: I have personally reviewed following labs and imaging studies CBC: Recent Labs  Lab 09/01/19 2325 09/02/19 0331 09/02/19 0644 09/02/19 1059 09/03/19 0802 09/04/19 0334 09/05/19 0333  WBC 9.5   < > 8.2 7.0 8.9 6.6 6.6  NEUTROABS 5.3  --   --   --   --   --   --   HGB 11.5*   < > 10.2* 10.4* 10.7* 9.6* 9.6*  HCT 37.0*   < >  33.1* 33.4* 34.7* 31.4* 31.3*  MCV 97.4   < > 97.1 96.8 97.5 98.4 98.7  PLT 450*   < > 418* 401* 403* 336 312   < > = values in this interval not displayed.   Basic Metabolic Panel: Recent Labs  Lab 09/01/19 2325 09/02/19 0331 09/04/19 0334 09/05/19 0333  NA 143 142 141 141  K 4.6 4.4 3.7 3.7  CL 105 107 109 109  CO2 29 26 21* 23  GLUCOSE 101* 105* 84 98  BUN 42* 42* 30* 26*  CREATININE 1.56* 1.41* 1.27* 1.18  CALCIUM 9.4 9.2 8.5* 8.8*   GFR: Estimated Creatinine Clearance: 53.9 mL/min (by C-G formula based on SCr of 1.18 mg/dL). Liver Function Tests: Recent Labs  Lab 09/01/19 2325  AST 36  ALT 63*  ALKPHOS 122  BILITOT 0.7  PROT 7.3  ALBUMIN 3.4*   No results for input(s): LIPASE, AMYLASE in the last 168 hours. No results for input(s): AMMONIA in the last 168 hours. Coagulation Profile: No results for input(s): INR, PROTIME in the last 168 hours. Cardiac Enzymes: No results for input(s): CKTOTAL, CKMB, CKMBINDEX, TROPONINI in the last 168 hours. BNP (last 3 results) No results for input(s): PROBNP in the last 8760 hours. HbA1C: No results for input(s): HGBA1C in the last 72 hours. CBG: Recent Labs  Lab 09/04/19 1311 09/04/19 1716 09/05/19 0008 09/05/19 0529 09/05/19 1217  GLUCAP 73 75 90 79 94     Lipid Profile: No results for input(s): CHOL, HDL, LDLCALC, TRIG, CHOLHDL, LDLDIRECT in the last 72 hours. Thyroid Function Tests: No results for input(s): TSH, T4TOTAL, FREET4, T3FREE, THYROIDAB in the last 72 hours. Anemia Panel: No results for input(s): VITAMINB12, FOLATE, FERRITIN, TIBC, IRON, RETICCTPCT in the last 72 hours. Sepsis Labs: No results for input(s): PROCALCITON, LATICACIDVEN in the last 168 hours.  Recent Results (from the past 240 hour(s))  SARS Coronavirus 2 by RT PCR (hospital order, performed in [facility] hospital lab) Nasopharyngeal Nasopharyngeal Swab     Status: None   Collection Time: 09/02/19  1:21 AM   Specimen: Nasopharyngeal Swab  Result Value Ref Range Status   SARS Coronavirus 2 NEGATIVE NEGATIVE Final    Comment: (NOTE) SARS-CoV-2 target nucleic acids are NOT DETECTED.  The SARS-CoV-2 RNA is generally detectable in upper and lower respiratory specimens during the acute phase of infection. The lowest concentration of SARS-CoV-2 viral copies this assay can detect is 250 copies / mL. A negative result does not preclude SARS-CoV-2 infection and should not be used as the sole basis for treatment or other patient management decisions.  A negative result may occur with improper specimen collection / handling, submission of specimen other than nasopharyngeal swab, presence of viral mutation(s) within the areas targeted by this assay, and inadequate number of viral copies (<250 copies / mL). A negative result must be combined with clinical observations, patient history, and epidemiological information.  Fact Sheet for Patients:   StrictlyIdeas.no  Fact Sheet for Healthcare Providers: BankingDealers.co.za  This test is not yet approved or  cleared by the [place] FDA and has been authorized for detection and/or diagnosis of SARS-CoV-2 by FDA under an Emergency Use Authorization (EUA).  This EUA will  remain in effect (meaning this test can be used) for the duration of the COVID-19 declaration under Section 564(b)(1) of the Act, 21 U.S.C. section 360bbb-3(b)(1), unless the authorization is terminated or revoked sooner.  Performed at [facility], [place] [address], [place], [place] [address]  Radiology Studies: No results found.   LOS: 3 days   Antonieta Pert, MD [facility]  09/05/2019, 1:15 PM

## 2019-09-05 NOTE — Progress Notes (Signed)
On call Chaplain following up on request from weekday Chaplains to provide support for this patient.  Patient was sitting up in his chair and initially began to talk about how he wanted to go back to house and just get out of the hospital.  Patient participated in life review about his over 30 years in the [employer] and the many places he lived.  Patient spoke about how he met his wife and his great love for her.  Patient grew up [place] has always been important to him. 2 Nurse Techs came to help patient back in bed, patient grew anxious as he was not able to stand and wanted to just stay in the chair.  Patient began to say, maybe I'm not ready to go home.  Patient was tearful at times and shared about his [place]etary plot and that he was ready to be done with all.  Chaplain provided comfort, sacred text, prayer.  Chaplain and patient discussed some breathing and mediation that might help him be calmer when he gets anxious.  Patient very appreciative of the prayer shawl.  Chaplain available as needed. [place], MDiv.     09/05/19 1137  Clinical Encounter Type  Visited With Patient;Health care provider  Visit Type Follow-up;Spiritual support  Referral From Chaplain  Consult/Referral To Chaplain  Spiritual Encounters  Spiritual Needs Emotional;Prayer;Sacred text  Stress Factors  Patient Stress Factors Lack of knowledge;Health changes;Exhausted

## 2019-09-06 LAB — BASIC METABOLIC PANEL
Anion gap: 11 (ref 5–15)
BUN: 18 mg/dL (ref 8–23)
CO2: 24 mmol/L (ref 22–32)
Calcium: 8.9 mg/dL (ref 8.9–10.3)
Chloride: 106 mmol/L (ref 98–111)
Creatinine, Ser: 1.17 mg/dL (ref 0.61–1.24)
GFR calc Af Amer: >60 mL/min (ref >60)
GFR calc non Af Amer: 59 mL/min — ABNORMAL LOW (ref >60)
Glucose, Bld: 98 mg/dL (ref 70–99)
Potassium: 4.1 mmol/L (ref 3.5–5.1)
Sodium: 141 mmol/L (ref 135–145)

## 2019-09-06 LAB — MAGNESIUM: Magnesium: 1.8 mg/dL (ref 1.7–2.4)

## 2019-09-06 LAB — CBC
HCT: 32 % — ABNORMAL LOW (ref 39.0–52.0)
Hemoglobin: 10 g/dL — ABNORMAL LOW (ref 13.0–17.0)
MCH: 30.2 pg (ref 26.0–34.0)
MCHC: 31.3 g/dL (ref 30.0–36.0)
MCV: 96.7 fL (ref 80.0–100.0)
Platelets: 302 10*3/uL (ref 150–400)
RBC: 3.31 MIL/uL — ABNORMAL LOW (ref 4.22–5.81)
RDW: 16.8 % — ABNORMAL HIGH (ref 11.5–15.5)
WBC: 6.4 10*3/uL (ref 4.0–10.5)
nRBC: 0 % (ref 0.0–0.2)

## 2019-09-06 LAB — GLUCOSE, CAPILLARY
Glucose-Capillary: 90 mg/dL (ref 70–99)
Glucose-Capillary: 98 mg/dL (ref 70–99)
Glucose-Capillary: 142 mg/dL — ABNORMAL HIGH (ref 70–99)

## 2019-09-06 MED ORDER — PRO-STAT SUGAR FREE PO LIQD
30.0000 mL | Freq: Two times a day (BID) | ORAL | Status: DC
  Administered 2019-09-06 – 2019-09-08 (×4): 30 mL
  Filled 2019-09-06 (×4): qty 30

## 2019-09-06 MED ORDER — SUCRALFATE 1 GM/10ML PO SUSP
2.0000 g | Freq: Two times a day (BID) | ORAL | Status: DC
  Administered 2019-09-06 – 2019-09-08 (×5): 2 g via RECTAL
  Filled 2019-09-06 (×5): qty 20

## 2019-09-06 MED ORDER — JEVITY 1.5 CAL/FIBER PO LIQD
1000.0000 mL | ORAL | Status: DC
  Administered 2019-09-06: 1000 mL
  Filled 2019-09-06 (×3): qty 1000

## 2019-09-06 MED ORDER — NON FORMULARY: 20.0000 mL | Freq: Two times a day (BID) | Status: DC

## 2019-09-06 NOTE — Progress Notes (Signed)
Brief Nutrition Note RD working remotely.   Consult received for enteral/tube feeding initiation and management.  Adult Enteral Nutrition Protocol initiated. Full assessment to follow. Patient was previously receiving Jevity 1.5 @ 50 ml/hr with 30 ml Prostat BID. Will order this regimen.   Admitting Dx: Acute GI bleeding [K92.2] Lower GI bleed [K92.2]  Body mass index is 20.92 kg/m. Pt meets criteria for normal weight  based on current BMI.  Labs:  Recent Labs  Lab 09/04/19 0334 09/05/19 0333 09/06/19 0914  NA 141 141 141  K 3.7 3.7 4.1  CL 109 109 106  CO2 21* 23 24  BUN 30* 26* 18  CREATININE 1.27* 1.18 1.17  CALCIUM 8.5* 8.8* 8.9  MG  --   --  1.8  GLUCOSE 84 98 98       Stephen Dolley, MS, RD, LDN, CNSC Inpatient Clinical Dietitian RD pager # available in [facility]  After hours/weekend pager # available in [facility]

## 2019-09-06 NOTE — Progress Notes (Signed)
PROGRESS NOTE    Stephen Copeland  LKG:[id number] DOB: June 15, 1940 DOA: 09/01/2019 PCP: [name]., MD   Chef Complaints: Rectal bleeding  Brief Narrative: 79 y.o. male with history of A. fib cardiomyopathy hypertension previous history of prostate cancer who was recently admitted and discharged last week after having a complicated course after patient was diagnosed with stroke underwent intervention stent placement for stroke and complicated with intracranial hemorrhage and respiratory failure requiring intubation secondary to aspiration pneumonia and during the stay patient also was noted to have bright red blood per rectum for which patient's Eliquis had to be held off and on eventually discharged with no Eliquis but Brilinta presently on Plavix for the stent was noticed to have some bleeding per rectum at the living facility..  Denies any nausea vomiting.  Patient has a PEG tube secondary to dysphagia.  Patient states over the last 3 days been eating solid food.  During the last day patient also had an EGD which did not show any acute.  ED Course: In the ER patient again was noted to have some bleeding per rectum.  Hemodynamically stable. Lab work show hemoglobin is around 1.5 increased from baseline from recent stay which was 9.  Creatinine increased from baseline around 1.4 and is around 1.5.  Covid test was negative.  Patient admitted for acute GI bleed. Underwent colonoscopy: Suspected radiation proctitis versus diverticular bleed.  Subjective:  Had episode of bleeding last night but no recurrence since.  Patient feels nauseous with thickener.Requesting to use PEG tube for drink. No abdominal pain no vomiting.  Appears pleasant.  Assessment & Plan:  Acute GI bleeding with rectal bleeding.  Status post colonoscopy- recent bleeding from either diverticular hemorrhage or radiation proctitis.  Okay to resume Plavix.  Had episode of bleeding last night but no recurrence.  Hemoglobin  has gone up and stable- notified GI this am-starting sucralfate enemas 20 mL of 10% suspension twice a day for 4 weeks or until the bleeding has stopped.  GI recommends psylium twice daily at least 64 ounces of water daily and a high-fiber diet.   Paroxysmal atrial fibrillation:Continue metoprolol and Plavix as per GI.  Recent stroke with intervention with stent placement: Patient on Plavix.  Neurology advised Eliquis can be held but Plavix has to be continued.  If continues to have bleeding issues will need to talk with interventional neurologist Dr Alfonso Ramus regarding recommendation.Starting Plavix and will watch closely.  H&H is stable.    Anemia of chronic disease: Hb stable at 9 to 10 g.Monitor.   Metabolic acidosis:bicarb level improved to 23.Monitor.   Dilated cardiomyopathy: Compensated.  Dysphagia PEG tube in place.  Patient reports he is taking orally as well for last 3 days PTA.  Seen by speech therapy and placed on clear diet with nectar thick liquid patient feels nauseous with that.  Consult dietitian to to help with PEG tube feeding.  Watch for aspiration.   Hypoglycemia: From poor intake, dietitian consultED, on dextrose IV fluids and will wean off sling gradually. 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  DVT prophylaxis: SCDs Start: 09/02/19 0315 Code Status: full Family Communication: plan of care discussed with patient at bedside.  Status is: Inpatient  Remains inpatient appropriate because:Ongoing diagnostic testing needed not appropriate for outpatient work up and Inpatient level of care appropriate due to  severity of illness  Dispo: The patient is from: SNF Lives at home with 4 yo wife              Anticipated d/c is to: SNF              Anticipated d/c date is: 1-2 day              Patient currently is  not medically stable to d/c.  Watch for recurrent rectal bleeding, monitor for diet tolerance, plan to return to skilled nursing facility  Nutrition: Diet Order            Diet clear liquid Room service appropriate? No; Fluid consistency: Nectar Thick  Diet effective now                 Nutrition Problem: Inadequate oral intake Etiology: dysphagia (s/p stroke) Signs/Symptoms: NPO status Interventions: Tube feeding, Prostat Body mass index is 20.92 kg/m. Consultants: GI, neurology Procedures: Microbiology:see note  Medications: Scheduled Meds: . chlorhexidine  15 mL Mouth Rinse BID  . clopidogrel  75 mg Per Tube Daily  . feeding supplement (PRO-STAT SUGAR FREE 64)  30 mL Oral BID  . mouth rinse  15 mL Mouth Rinse q12n4p  . metoprolol tartrate  2.5 mg Intravenous Q6H  . pantoprazole sodium  40 mg Per Tube Daily   Continuous Infusions: . dextrose 30 mL/hr at 09/05/19 2330  . lactated ringers Stopped (09/04/19 1130)    Antimicrobials: Anti-infectives (From admission, onward)   None       Objective: Vitals: Today's Vitals   09/05/19 1452 09/05/19 2015 09/05/19 2030 09/06/19 0512  BP: (!) 147/83 (!) 151/80  (!) 162/84  Pulse: 92 73  82  Resp: 18 18  18   Temp: 97.8 F (36.6 C) 97.7 F (36.5 C)  98 F (36.7 C)  TempSrc: Oral Oral  Oral  SpO2: 98% 97%  96%  Weight:      Height:      PainSc:   0-No pain     Intake/Output Summary (Last 24 hours) at 09/06/2019 1143 Last data filed at 09/06/2019 0358 Gross per 24 hour  Intake 754.04 ml  Output 675 ml  Net 79.04 ml   Filed Weights   09/02/19 0257 09/04/19 0944  Weight: 73.9 kg 73.9 kg   Weight change:    Intake/Output from previous day: 07/03 0701 - 07/04 0700 In: 754 [P.O.:118; I.V.:636] Out: 675 [Urine:675] Intake/Output this shift: No intake/output data recorded.  Examination: General exam:AAOx3,NAD,weak appearing. HEENT:Oral mucosa moist, Ear/Nose WNL grossly, dentition normal. Respiratory system:  bilaterally clear,no wheezing or crackles,no use of accessory muscle Cardiovascular system: S1 & S2 +,No JVD,. Gastrointestinal system:Abdomen soft,NT,ND, BS+.  PEG tube present. Nervous System:Alert, awake, moving extremities and grossly nonfocal Extremities:No edema, distal peripheral pulses palpable.  Skin: No rashes,no icterus. MSK: Normal muscle bulk,tone, power  Data Reviewed: I have personally reviewed following labs and imaging studies CBC: Recent Labs  Lab 09/01/19 2325 09/02/19 0331 09/02/19 1059 09/03/19 0802 09/04/19 0334 09/05/19 0333 09/06/19 0416  WBC 9.5   < > 7.0 8.9 6.6 6.6 6.4  NEUTROABS 5.3  --   --   --   --   --   --   HGB 11.5*   < > 10.4* 10.7* 9.6* 9.6* 10.0*  HCT 37.0*   < > 33.4* 34.7* 31.4* 31.3* 32.0*  MCV 97.4   < > 96.8 97.5 98.4 98.7 96.7  PLT 450*   < > 401* 403* 336 312  302   < > = values in this interval not displayed.   Basic Metabolic Panel: Recent Labs  Lab 09/01/19 2325 09/02/19 0331 09/04/19 0334 09/05/19 0333 09/06/19 0914  NA 143 142 141 141 141  K 4.6 4.4 3.7 3.7 4.1  CL 105 107 109 109 106  CO2 29 26 21* 23 24  GLUCOSE 101* 105* 84 98 98  BUN 42* 42* 30* 26* 18  CREATININE 1.56* 1.41* 1.27* 1.18 1.17  CALCIUM 9.4 9.2 8.5* 8.8* 8.9  MG  --   --   --   --  1.8   GFR: Estimated Creatinine Clearance: 54.4 mL/min (by C-G formula based on SCr of 1.17 mg/dL). Liver Function Tests: Recent Labs  Lab 09/01/19 2325  AST 36  ALT 63*  ALKPHOS 122  BILITOT 0.7  PROT 7.3  ALBUMIN 3.4*   No results for input(s): LIPASE, AMYLASE in the last 168 hours. No results for input(s): AMMONIA in the last 168 hours. Coagulation Profile: No results for input(s): INR, PROTIME in the last 168 hours. Cardiac Enzymes: No results for input(s): CKTOTAL, CKMB, CKMBINDEX, TROPONINI in the last 168 hours. BNP (last 3 results) No results for input(s): PROBNP in the last 8760 hours. HbA1C: No results for input(s): HGBA1C in the last 72  hours. CBG: Recent Labs  Lab 09/05/19 0529 09/05/19 1217 09/05/19 1720 09/05/19 2335 09/06/19 0533  GLUCAP 79 94 102* 88 90   Lipid Profile: No results for input(s): CHOL, HDL, LDLCALC, TRIG, CHOLHDL, LDLDIRECT in the last 72 hours. Thyroid Function Tests: No results for input(s): TSH, T4TOTAL, FREET4, T3FREE, THYROIDAB in the last 72 hours. Anemia Panel: No results for input(s): VITAMINB12, FOLATE, FERRITIN, TIBC, IRON, RETICCTPCT in the last 72 hours. Sepsis Labs: No results for input(s): PROCALCITON, LATICACIDVEN in the last 168 hours.  Recent Results (from the past 240 hour(s))  SARS Coronavirus 2 by RT PCR (hospital order, performed in [facility] hospital lab) Nasopharyngeal Nasopharyngeal Swab     Status: None   Collection Time: 09/02/19  1:21 AM   Specimen: Nasopharyngeal Swab  Result Value Ref Range Status   SARS Coronavirus 2 NEGATIVE NEGATIVE Final    Comment: (NOTE) SARS-CoV-2 target nucleic acids are NOT DETECTED.  The SARS-CoV-2 RNA is generally detectable in upper and lower respiratory specimens during the acute phase of infection. The lowest concentration of SARS-CoV-2 viral copies this assay can detect is 250 copies / mL. A negative result does not preclude SARS-CoV-2 infection and should not be used as the sole basis for treatment or other patient management decisions.  A negative result may occur with improper specimen collection / handling, submission of specimen other than nasopharyngeal swab, presence of viral mutation(s) within the areas targeted by this assay, and inadequate number of viral copies (<250 copies / mL). A negative result must be combined with clinical observations, patient history, and epidemiological information.  Fact Sheet for Patients:   StrictlyIdeas.no  Fact Sheet for Healthcare Providers: BankingDealers.co.za  This test is not yet approved or  cleared by the [place] FDA  and has been authorized for detection and/or diagnosis of SARS-CoV-2 by FDA under an Emergency Use Authorization (EUA).  This EUA will remain in effect (meaning this test can be used) for the duration of the COVID-19 declaration under Section 564(b)(1) of the Act, 21 U.S.C. section 360bbb-3(b)(1), unless the authorization is terminated or revoked sooner.  Performed at [facility], [place] [address] [place], [place] [address]  Radiology Studies: DG Swallowing Func-Speech Pathology  Result Date: 09/05/2019 Objective Swallowing Evaluation: Type of Study: MBS-Modified Barium Swallow Study  Patient Details Name: Yonael Tulloch MRN: [id number] Date of Birth: [birth year] Today's Date: 09/05/2019 Time: SLP Start Time (ACUTE ONLY): 1520 -SLP Stop Time (ACUTE ONLY): 6283 SLP Time Calculation (min) (ACUTE ONLY): 15 min Past Medical History: Past Medical History: Diagnosis Date . Acid reflux  . CHF (congestive heart failure) ([place])  . Coronary artery disease  . Dilated cardiomyopathy ([place])  . Folliculitis  . Gastric ulcer  . Hiatal hernia  . Hyperlipidemia   a. pt is adamantly against statins. . Hypertension  . Ischemia   a. Pt states he was diagnosed with "ischemia" in the 1990s but does not know further details, denies hx of heart blockage. . Nummular dermatitis  . Prostate cancer ([place])  . Renal disorder  . Scoliosis  . Sinus bradycardia  . Stomach ulcer   a. remote ulcer in the 1990s, no hx of bleeding. Past Surgical History: Past Surgical History: Procedure Laterality Date . ESOPHAGOGASTRODUODENOSCOPY (EGD) WITH PROPOFOL N/A 08/04/2019  Procedure: ESOPHAGOGASTRODUODENOSCOPY (EGD) WITH PROPOFOL;  Surgeon: Jesusita Oka, MD;  Location: [facility];  Service: General;  Laterality: N/A; . INGUINAL HERNIA REPAIR Left  . IR ANGIO INTRA EXTRACRAN SEL COM CAROTID INNOMINATE UNI L MOD SED  07/10/2019 . IR ANGIO VERTEBRAL SEL SUBCLAVIAN INNOMINATE UNI R MOD SED  07/10/2019 . IR CT HEAD LTD  07/10/2019 . IR  INTRAVSC STENT CERV CAROTID W/O EMB-PROT MOD SED INC ANGIO  07/10/2019 . IR PERCUTANEOUS ART THROMBECTOMY/INFUSION INTRACRANIAL INC DIAG ANGIO  07/10/2019 . IR REPLC GASTRO/COLONIC TUBE PERCUT W/FLUORO  08/13/2019 . PEG PLACEMENT N/A 08/04/2019  Procedure: PERCUTANEOUS ENDOSCOPIC GASTROSTOMY (PEG) PLACEMENT;  Surgeon: Jesusita Oka, MD;  Location: [place];  Service: General;  Laterality: N/A; . RADIOLOGY WITH ANESTHESIA N/A 07/09/2019  Procedure: IR WITH ANESTHESIA;  Surgeon: Luanne Bras, MD;  Location: [place];  Service: Radiology;  Laterality: N/A; HPI: 79 y.o. male with PMH of Afib, systolic CHF, HLD, CAD, HTN, GERD, prostate ca, and recent right MCA infarct 08/09/27, complicated by [facility] two days later, admitted to [facility] from rehab facility for rectal bleeding. Underwent initial clinical swallow evaluation 07/10/19, had a cortak, demonstrated multiple refusals to participate, refused attempts at [facility], and was followed by SLP until 6/7, at which time he was D/Cd from our service due to poor participation and progress.  PEG 6/1 and 6/10. Pt was on a dysphagia 1 diet with honey-thick liquids at time of D/C, but oral intake was minimal. Per notes, he has been tolerating solids in addition to g-tube feedings. GI is following; TF resumed; SLP ordered to assess if he can swallow POs.  Subjective: alert, agitated, confused Assessment / Plan / Recommendation CHL IP CLINICAL IMPRESSIONS 09/05/2019 Clinical Impression Patient presents with a severe oropharyngeal dysphagia characterized by decreased oral manipulation and transit of puree solids and nectar thick liquids, swallow initiation delays to vallecular sinus with nectar thick liquids and puree solid and to pyriform with thin liquids. Patient exhibited moderate amount of aspiration prior to and during swallow of cup sip of thin liquids which he did not react to until already passing through vocal cords. Patient then started to cough, gag and regurgitate violently and saying  he needed oxygen. After settling down, he did accept a couple sips of nectar thick liquids which did not result in aspiration or penetration but did cause mild vallecular and pyriform sinus residuals. Patient exhibited mild-moderate amount  of vallecular sinus residuals with puree solids but this did clear mostly with subsequent swallows. Patient is at a high risk of aspiration and lack of hydration/nutrition. SLP Visit Diagnosis Dysphagia, oropharyngeal phase (R13.12) Attention and concentration deficit following -- Frontal lobe and executive function deficit following -- Impact on safety and function Severe aspiration risk   CHL IP TREATMENT RECOMMENDATION 09/05/2019 Treatment Recommendations Therapy as outlined in treatment plan below   Prognosis 09/05/2019 Prognosis for Safe Diet Advancement Fair Barriers to Reach Goals Cognitive deficits;Behavior;Severity of deficits Barriers/Prognosis Comment patient with agitation and poor compliance/participation CHL IP DIET RECOMMENDATION 09/05/2019 SLP Diet Recommendations Nectar thick liquid Liquid Administration via Cup Medication Administration Other (Comment) Compensations Slow rate;Small sips/bites;Minimize environmental distractions Postural Changes Remain semi-upright after after feeds/meals (Comment);Seated upright at 90 degrees   CHL IP OTHER RECOMMENDATIONS 09/05/2019 Recommended Consults -- Oral Care Recommendations Oral care BID;Staff/trained caregiver to provide oral care Other Recommendations Order thickener from pharmacy;Prohibited food (jello, ice cream, thin soups);Remove water pitcher;Have oral suction available;Clarify dietary restrictions   CHL IP FOLLOW UP RECOMMENDATIONS 09/05/2019 Follow up Recommendations Skilled Nursing facility   [facility] IP FREQUENCY AND DURATION 09/05/2019 Speech Therapy Frequency (ACUTE ONLY) min 2x/week Treatment Duration 2 weeks      CHL IP ORAL PHASE 09/05/2019 Oral Phase Impaired Oral - Pudding Teaspoon -- Oral - Pudding Cup -- Oral - Honey  Teaspoon -- Oral - Honey Cup -- Oral - Nectar Teaspoon -- Oral - Nectar Cup Weak lingual manipulation;Incomplete tongue to palate contact;Reduced posterior propulsion;Holding of bolus Oral - Nectar Straw -- Oral - Thin Teaspoon -- Oral - Thin Cup Incomplete tongue to palate contact;Weak lingual manipulation;Delayed oral transit Oral - Thin Straw -- Oral - Puree Reduced posterior propulsion;Incomplete tongue to palate contact;Weak lingual manipulation;Delayed oral transit;Decreased bolus cohesion;Piecemeal swallowing Oral - Mech Soft -- Oral - Regular -- Oral - Multi-Consistency -- Oral - Pill -- Oral Phase - Comment --  CHL IP PHARYNGEAL PHASE 09/05/2019 Pharyngeal Phase Impaired Pharyngeal- Pudding Teaspoon -- Pharyngeal -- Pharyngeal- Pudding Cup -- Pharyngeal -- Pharyngeal- Honey Teaspoon -- Pharyngeal -- Pharyngeal- Honey Cup -- Pharyngeal -- Pharyngeal- Nectar Teaspoon -- Pharyngeal -- Pharyngeal- Nectar Cup Delayed swallow initiation-vallecula;Pharyngeal residue - valleculae;Pharyngeal residue - pyriform;Pharyngeal residue - posterior pharnyx Pharyngeal -- Pharyngeal- Nectar Straw -- Pharyngeal -- Pharyngeal- Thin Teaspoon -- Pharyngeal -- Pharyngeal- Thin Cup Delayed swallow initiation-vallecula;Delayed swallow initiation-pyriform sinuses;Reduced airway/laryngeal closure;Penetration/Aspiration before swallow;Penetration/Aspiration during swallow;Moderate aspiration;Pharyngeal residue - valleculae Pharyngeal Material enters airway, passes BELOW cords and not ejected out despite cough attempt by patient;Material enters airway, passes BELOW cords without attempt by patient to eject out (silent aspiration) Pharyngeal- Thin Straw -- Pharyngeal -- Pharyngeal- Puree Delayed swallow initiation-vallecula;Pharyngeal residue - valleculae;Pharyngeal residue - pyriform Pharyngeal -- Pharyngeal- Mechanical Soft -- Pharyngeal -- Pharyngeal- Regular -- Pharyngeal -- Pharyngeal- Multi-consistency -- Pharyngeal -- Pharyngeal-  Pill -- Pharyngeal -- Pharyngeal Comment --  CHL IP CERVICAL ESOPHAGEAL PHASE 09/05/2019 Cervical Esophageal Phase WFL Pudding Teaspoon -- Pudding Cup -- Honey Teaspoon -- Honey Cup -- Nectar Teaspoon -- Nectar Cup -- Nectar Straw -- Thin Teaspoon -- Thin Cup -- Thin Straw -- Puree -- Mechanical Soft -- Regular -- Multi-consistency -- Pill -- Cervical Esophageal Comment -- Sonia Baller, MA, CCC-SLP Speech Therapy [facility]                LOS: 4 days   Antonieta Pert, MD [facility]  09/06/2019, 11:43 AM

## 2019-09-06 NOTE — Progress Notes (Signed)
Alerted MD, via text, that pt just had a 17 beat run of VTach. Pt sleeping.

## 2019-09-07 LAB — GLUCOSE, CAPILLARY
Glucose-Capillary: 130 mg/dL — ABNORMAL HIGH (ref 70–99)
Glucose-Capillary: 142 mg/dL — ABNORMAL HIGH (ref 70–99)
Glucose-Capillary: 149 mg/dL — ABNORMAL HIGH (ref 70–99)
Glucose-Capillary: 157 mg/dL — ABNORMAL HIGH (ref 70–99)

## 2019-09-07 LAB — BASIC METABOLIC PANEL
Anion gap: 8 (ref 5–15)
BUN: 21 mg/dL (ref 8–23)
CO2: 27 mmol/L (ref 22–32)
Calcium: 8.9 mg/dL (ref 8.9–10.3)
Chloride: 102 mmol/L (ref 98–111)
Creatinine, Ser: 1.12 mg/dL (ref 0.61–1.24)
GFR calc Af Amer: >60 mL/min (ref >60)
GFR calc non Af Amer: >60 mL/min (ref >60)
Glucose, Bld: 156 mg/dL — ABNORMAL HIGH (ref 70–99)
Potassium: 3.6 mmol/L (ref 3.5–5.1)
Sodium: 137 mmol/L (ref 135–145)

## 2019-09-07 LAB — CBC
HCT: 35.4 % — ABNORMAL LOW (ref 39.0–52.0)
Hemoglobin: 10.9 g/dL — ABNORMAL LOW (ref 13.0–17.0)
MCH: 29.8 pg (ref 26.0–34.0)
MCHC: 30.8 g/dL (ref 30.0–36.0)
MCV: 96.7 fL (ref 80.0–100.0)
Platelets: 315 10*3/uL (ref 150–400)
RBC: 3.66 MIL/uL — ABNORMAL LOW (ref 4.22–5.81)
RDW: 16.8 % — ABNORMAL HIGH (ref 11.5–15.5)
WBC: 9.7 10*3/uL (ref 4.0–10.5)
nRBC: 0 % (ref 0.0–0.2)

## 2019-09-07 MED ORDER — MELATONIN 3 MG PO TABS
3.0000 mg | ORAL_TABLET | Freq: Every day | ORAL | Status: DC
  Administered 2019-09-07: 3 mg via ORAL
  Filled 2019-09-07: qty 1

## 2019-09-07 MED ORDER — HYDROCODONE-ACETAMINOPHEN 5-325 MG PO TABS: 1.0000 | ORAL_TABLET | Freq: Two times a day (BID) | ORAL | Status: DC | PRN

## 2019-09-07 MED ORDER — FREE WATER
200.0000 mL | Freq: Four times a day (QID) | Status: DC
  Administered 2019-09-07 – 2019-09-08 (×4): 200 mL

## 2019-09-07 NOTE — Progress Notes (Signed)
PROGRESS NOTE    Stephen Copeland  RFX:[id number] DOB: January 27, 1941 DOA: 09/01/2019 PCP: [name]., MD   Chef Complaints: Rectal bleeding  Brief Narrative: 79 y.o. male with history of A. fib cardiomyopathy hypertension previous history of prostate cancer who was recently admitted and discharged last week after having a complicated course after patient was diagnosed with stroke underwent intervention stent placement for stroke and complicated with intracranial hemorrhage and respiratory failure requiring intubation secondary to aspiration pneumonia and during the stay patient also was noted to have bright red blood per rectum for which patient's Eliquis had to be held off and on eventually discharged with no Eliquis but Brilinta presently on Plavix for the stent was noticed to have some bleeding per rectum at the living facility..  Denies any nausea vomiting.  Patient has a PEG tube secondary to dysphagia.  Patient states over the last 3 days been eating solid food.  During the last day patient also had an EGD which did not show any acute.  ED Course: In the ER patient again was noted to have some bleeding per rectum.  Hemodynamically stable. Lab work show hemoglobin is around 1.5 increased from baseline from recent stay which was 9.  Creatinine increased from baseline around 1.4 and is around 1.5.  Covid test was negative.  Patient admitted for acute GI bleed. Underwent colonoscopy: Suspected radiation proctitis versus diverticular bleed.  Subjective: Hemoglobin is stable and increasing. Patient upset about not being able to return to SNF today. Alert and oriented. Nursing reports no recurrence of rectal bleeding.  Assessment & Plan:  Acute GI bleeding with rectal bleeding.  Status post colonoscopy- recent bleeding from either diverticular hemorrhage or radiation proctitis. Patient is back on Plavix. No episode of bleeding 7/3 discussed with GI continue Plavix, started on sucralfate  enemas 20 mL of 10% suspension twice a day for 4 weeks or until the bleeding has stopped.  GI recommends psylium twice daily at least 64 ounces of water daily and a high-fiber diet.   Paroxysmal atrial fibrillation:Continue metoprolol and Plavix as per GI.  Recent stroke with intervention with stent placement: Patient on Plavix.  Neurology advised Eliquis can be held but Plavix has to be continued.  If continues to have bleeding issues will need to talk with interventional neurologist Dr Alfonso Ramus regarding recommendation.continue Plavix. Watch for bleeding. Hemoglobin is stable.   Anemia of chronic disease: Hb stable 9 to 10 g range.   Metabolic acidosis: Resolved. Dilated cardiomyopathy: Compensated.  Dysphagia PEG tube in place. Continue clear liquid diet with thickener per speech, continue tube feeding as per dietitian with Jevity 1.5 at 50 mill per hour with 30 mg tab twice daily. tolerating well.  Hypoglycemia: From poor intake, now on tube feed. Will stop dextrose IV fluids. 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  DVT prophylaxis: SCDs Start: 09/02/19 0315 Code Status: full Family Communication: plan of care discussed with patient at bedside.  Status is: Inpatient  Remains inpatient appropriate because:Ongoing diagnostic testing needed not appropriate for outpatient work up and Inpatient level of care appropriate due to severity of illness  Dispo: The patient is from: SNF Lives at home with 104 yo wife              Anticipated d/c is to: SNF  Anticipated d/c date is: Once bed available.              Patient currently is medically stable. Awaiting for SNF placement  Nutrition: Diet Order            Diet clear liquid Room service appropriate? No; Fluid consistency: Nectar Thick  Diet effective now                  Nutrition Problem: Inadequate oral intake Etiology: dysphagia (s/p stroke) Signs/Symptoms: NPO status Interventions: Tube feeding, Prostat Body mass index is 20.92 kg/m. Consultants: GI, neurology Procedures: Microbiology:see note  Medications: Scheduled Meds:  chlorhexidine  15 mL Mouth Rinse BID   clopidogrel  75 mg Per Tube Daily   feeding supplement (PRO-STAT SUGAR FREE 64)  30 mL Per Tube BID   free water  200 mL Per Tube Q6H   mouth rinse  15 mL Mouth Rinse q12n4p   melatonin  3 mg Oral QHS   metoprolol tartrate  2.5 mg Intravenous Q6H   pantoprazole sodium  40 mg Per Tube Daily   sucralfate  2 g Rectal BID   Continuous Infusions:  dextrose 30 mL/hr at 09/05/19 2330   feeding supplement (JEVITY 1.5 CAL/FIBER) 1,000 mL (09/06/19 1435)   lactated ringers Stopped (09/04/19 1130)    Antimicrobials: Anti-infectives (From admission, onward)   None       Objective: Vitals: Today's Vitals   09/06/19 1427 09/06/19 2021 09/06/19 2236 09/07/19 0640  BP: (!) 155/81 128/88  (!) 141/78  Pulse: 76 (!) 110  94  Resp: 20 17  18   Temp: 97.6 F (36.4 C) 97.7 F (36.5 C)  98.9 F (37.2 C)  TempSrc: Oral Oral  Oral  SpO2: 97% 96%  97%  Weight:      Height:      PainSc:  10-Worst pain ever 0-No pain     Intake/Output Summary (Last 24 hours) at 09/07/2019 1342 Last data filed at 09/07/2019 0300 Gross per 24 hour  Intake 360 ml  Output 1000 ml  Net -640 ml   Filed Weights   09/02/19 0257 09/04/19 0944  Weight: 73.9 kg 73.9 kg   Weight change:    Intake/Output from previous day: 07/04 0701 - 07/05 0700 In: 360 [I.V.:360] Out: 1000 [Urine:1000] Intake/Output this shift: No intake/output data recorded.  Examination: General exam: AAOx3 , NAD, weak appearing. HEENT:Oral mucosa moist, Ear/Nose WNL grossly, dentition normal. Respiratory system: bilaterally clear,no wheezing or crackles,no use of accessory muscle Cardiovascular system: S1  & S2 +, No JVD,. Gastrointestinal system: Abdomen soft, NT,ND, BS+. PEG tube in place. Nervous System:Alert, awake, moving extremities and grossly nonfocal Extremities: No edema, distal peripheral pulses palpable.  Skin: No rashes,no icterus. MSK: Normal muscle bulk,tone, power    Data Reviewed: I have personally reviewed following labs and imaging studies CBC: Recent Labs  Lab 09/01/19 2325 09/02/19 0331 09/03/19 0802 09/04/19 0334 09/05/19 0333 09/06/19 0416 09/07/19 0429  WBC 9.5   < > 8.9 6.6 6.6 6.4 9.7  NEUTROABS 5.3  --   --   --   --   --   --   HGB 11.5*   < > 10.7* 9.6* 9.6* 10.0* 10.9*  HCT 37.0*   < > 34.7* 31.4* 31.3* 32.0* 35.4*  MCV 97.4   < > 97.5 98.4 98.7 96.7 96.7  PLT 450*   < > 403* 336 312 302 315   < > = values in this interval not displayed.  Basic Metabolic Panel: Recent Labs  Lab 09/02/19 0331 09/04/19 0334 09/05/19 0333 09/06/19 0914 09/07/19 0429  NA 142 141 141 141 137  K 4.4 3.7 3.7 4.1 3.6  CL 107 109 109 106 102  CO2 26 21* 23 24 27   GLUCOSE 105* 84 98 98 156*  BUN 42* 30* 26* 18 21  CREATININE 1.41* 1.27* 1.18 1.17 1.12  CALCIUM 9.2 8.5* 8.8* 8.9 8.9  MG  --   --   --  1.8  --    GFR: Estimated Creatinine Clearance: 56.8 mL/min (by C-G formula based on SCr of 1.12 mg/dL). Liver Function Tests: Recent Labs  Lab 09/01/19 2325  AST 36  ALT 63*  ALKPHOS 122  BILITOT 0.7  PROT 7.3  ALBUMIN 3.4*   No results for input(s): LIPASE, AMYLASE in the last 168 hours. No results for input(s): AMMONIA in the last 168 hours. Coagulation Profile: No results for input(s): INR, PROTIME in the last 168 hours. Cardiac Enzymes: No results for input(s): CKTOTAL, CKMB, CKMBINDEX, TROPONINI in the last 168 hours. BNP (last 3 results) No results for input(s): PROBNP in the last 8760 hours. HbA1C: No results for input(s): HGBA1C in the last 72 hours. CBG: Recent Labs  Lab 09/06/19 1219 09/06/19 1707 09/07/19 0023 09/07/19 0636  09/07/19 1145  GLUCAP 98 142* 142* 149* 157*   Lipid Profile: No results for input(s): CHOL, HDL, LDLCALC, TRIG, CHOLHDL, LDLDIRECT in the last 72 hours. Thyroid Function Tests: No results for input(s): TSH, T4TOTAL, FREET4, T3FREE, THYROIDAB in the last 72 hours. Anemia Panel: No results for input(s): VITAMINB12, FOLATE, FERRITIN, TIBC, IRON, RETICCTPCT in the last 72 hours. Sepsis Labs: No results for input(s): PROCALCITON, LATICACIDVEN in the last 168 hours.  Recent Results (from the past 240 hour(s))  SARS Coronavirus 2 by RT PCR (hospital order, performed in [facility] hospital lab) Nasopharyngeal Nasopharyngeal Swab     Status: None   Collection Time: 09/02/19  1:21 AM   Specimen: Nasopharyngeal Swab  Result Value Ref Range Status   SARS Coronavirus 2 NEGATIVE NEGATIVE Final    Comment: (NOTE) SARS-CoV-2 target nucleic acids are NOT DETECTED.  The SARS-CoV-2 RNA is generally detectable in upper and lower respiratory specimens during the acute phase of infection. The lowest concentration of SARS-CoV-2 viral copies this assay can detect is 250 copies / mL. A negative result does not preclude SARS-CoV-2 infection and should not be used as the sole basis for treatment or other patient management decisions.  A negative result may occur with improper specimen collection / handling, submission of specimen other than nasopharyngeal swab, presence of viral mutation(s) within the areas targeted by this assay, and inadequate number of viral copies (<250 copies / mL). A negative result must be combined with clinical observations, patient history, and epidemiological information.  Fact Sheet for Patients:   StrictlyIdeas.no  Fact Sheet for Healthcare Providers: BankingDealers.co.za  This test is not yet approved or  cleared by the [place] FDA and has been authorized for detection and/or diagnosis of SARS-CoV-2 by FDA under an  Emergency Use Authorization (EUA).  This EUA will remain in effect (meaning this test can be used) for the duration of the COVID-19 declaration under Section 564(b)(1) of the Act, 21 U.S.C. section 360bbb-3(b)(1), unless the authorization is terminated or revoked sooner.  Performed at [facility], [place] [address], [place], [place] [address]       Radiology Studies: DG Swallowing Func-Speech Pathology  Result Date: 09/05/2019 Objective Swallowing  Evaluation: Type of Study: MBS-Modified Barium Swallow Study  Patient Details Name: Margaret Staggs MRN: [id number] Date of Birth: October 15, 1940 Today's Date: 09/05/2019 Time: SLP Start Time (ACUTE ONLY): 1520 -SLP Stop Time (ACUTE ONLY): 1941 SLP Time Calculation (min) (ACUTE ONLY): 15 min Past Medical History: Past Medical History: Diagnosis Date  Acid reflux   CHF (congestive heart failure) (HCC)   Coronary artery disease   Dilated cardiomyopathy ([place])   Folliculitis   Gastric ulcer   Hiatal hernia   Hyperlipidemia   a. pt is adamantly against statins.  Hypertension   Ischemia   a. Pt states he was diagnosed with "ischemia" in the 1990s but does not know further details, denies hx of heart blockage.  Nummular dermatitis   Prostate cancer ([place])   Renal disorder   Scoliosis   Sinus bradycardia   Stomach ulcer   a. remote ulcer in the 1990s, no hx of bleeding. Past Surgical History: Past Surgical History: Procedure Laterality Date  ESOPHAGOGASTRODUODENOSCOPY (EGD) WITH PROPOFOL N/A 08/04/2019  Procedure: ESOPHAGOGASTRODUODENOSCOPY (EGD) WITH PROPOFOL;  Surgeon: Jesusita Oka, MD;  Location: [facility];  Service: General;  Laterality: N/A;  INGUINAL HERNIA REPAIR Left   IR ANGIO INTRA EXTRACRAN SEL COM CAROTID INNOMINATE UNI L MOD SED  07/10/2019  IR ANGIO VERTEBRAL SEL SUBCLAVIAN INNOMINATE UNI R MOD SED  07/10/2019  IR CT HEAD LTD  07/10/2019  IR INTRAVSC STENT CERV CAROTID W/O EMB-PROT MOD SED INC ANGIO  07/10/2019  IR PERCUTANEOUS  ART THROMBECTOMY/INFUSION INTRACRANIAL INC DIAG ANGIO  07/10/2019  IR [place] GASTRO/COLONIC TUBE PERCUT W/FLUORO  08/13/2019  PEG PLACEMENT N/A 08/04/2019  Procedure: PERCUTANEOUS ENDOSCOPIC GASTROSTOMY (PEG) PLACEMENT;  Surgeon: Jesusita Oka, MD;  Location: [place];  Service: General;  Laterality: N/A;  RADIOLOGY WITH ANESTHESIA N/A 07/09/2019  Procedure: IR WITH ANESTHESIA;  Surgeon: Luanne Bras, MD;  Location: [place];  Service: Radiology;  Laterality: N/A; HPI: 79 y.o. male with PMH of Afib, systolic CHF, HLD, CAD, HTN, GERD, prostate ca, and recent right MCA infarct 09/06/06, complicated by [facility] two days later, admitted to [facility] from rehab facility for rectal bleeding. Underwent initial clinical swallow evaluation 07/10/19, had a cortak, demonstrated multiple refusals to participate, refused attempts at [facility], and was followed by SLP until 6/7, at which time he was D/Cd from our service due to poor participation and progress.  PEG 6/1 and 6/10. Pt was on a dysphagia 1 diet with honey-thick liquids at time of D/C, but oral intake was minimal. Per notes, he has been tolerating solids in addition to g-tube feedings. GI is following; TF resumed; SLP ordered to assess if he can swallow POs.  Subjective: alert, agitated, confused Assessment / Plan / Recommendation CHL IP CLINICAL IMPRESSIONS 09/05/2019 Clinical Impression Patient presents with a severe oropharyngeal dysphagia characterized by decreased oral manipulation and transit of puree solids and nectar thick liquids, swallow initiation delays to vallecular sinus with nectar thick liquids and puree solid and to pyriform with thin liquids. Patient exhibited moderate amount of aspiration prior to and during swallow of cup sip of thin liquids which he did not react to until already passing through vocal cords. Patient then started to cough, gag and regurgitate violently and saying he needed oxygen. After settling down, he did accept a couple sips of nectar thick  liquids which did not result in aspiration or penetration but did cause mild vallecular and pyriform sinus residuals. Patient exhibited mild-moderate amount of vallecular sinus residuals with puree solids but this did clear mostly  with subsequent swallows. Patient is at a high risk of aspiration and lack of hydration/nutrition. SLP Visit Diagnosis Dysphagia, oropharyngeal phase (R13.12) Attention and concentration deficit following -- Frontal lobe and executive function deficit following -- Impact on safety and function Severe aspiration risk   CHL IP TREATMENT RECOMMENDATION 09/05/2019 Treatment Recommendations Therapy as outlined in treatment plan below   Prognosis 09/05/2019 Prognosis for Safe Diet Advancement Fair Barriers to Reach Goals Cognitive deficits;Behavior;Severity of deficits Barriers/Prognosis Comment patient with agitation and poor compliance/participation CHL IP DIET RECOMMENDATION 09/05/2019 SLP Diet Recommendations Nectar thick liquid Liquid Administration via Cup Medication Administration Other (Comment) Compensations Slow rate;Small sips/bites;Minimize environmental distractions Postural Changes Remain semi-upright after after feeds/meals (Comment);Seated upright at 90 degrees   CHL IP OTHER RECOMMENDATIONS 09/05/2019 Recommended Consults -- Oral Care Recommendations Oral care BID;Staff/trained caregiver to provide oral care Other Recommendations Order thickener from pharmacy;Prohibited food (jello, ice cream, thin soups);Remove water pitcher;Have oral suction available;Clarify dietary restrictions   CHL IP FOLLOW UP RECOMMENDATIONS 09/05/2019 Follow up Recommendations Skilled Nursing facility   [facility] IP FREQUENCY AND DURATION 09/05/2019 Speech Therapy Frequency (ACUTE ONLY) min 2x/week Treatment Duration 2 weeks      CHL IP ORAL PHASE 09/05/2019 Oral Phase Impaired Oral - Pudding Teaspoon -- Oral - Pudding Cup -- Oral - Honey Teaspoon -- Oral - Honey Cup -- Oral - Nectar Teaspoon -- Oral - Nectar Cup Weak  lingual manipulation;Incomplete tongue to palate contact;Reduced posterior propulsion;Holding of bolus Oral - Nectar Straw -- Oral - Thin Teaspoon -- Oral - Thin Cup Incomplete tongue to palate contact;Weak lingual manipulation;Delayed oral transit Oral - Thin Straw -- Oral - Puree Reduced posterior propulsion;Incomplete tongue to palate contact;Weak lingual manipulation;Delayed oral transit;Decreased bolus cohesion;Piecemeal swallowing Oral - Mech Soft -- Oral - Regular -- Oral - Multi-Consistency -- Oral - Pill -- Oral Phase - Comment --  CHL IP PHARYNGEAL PHASE 09/05/2019 Pharyngeal Phase Impaired Pharyngeal- Pudding Teaspoon -- Pharyngeal -- Pharyngeal- Pudding Cup -- Pharyngeal -- Pharyngeal- Honey Teaspoon -- Pharyngeal -- Pharyngeal- Honey Cup -- Pharyngeal -- Pharyngeal- Nectar Teaspoon -- Pharyngeal -- Pharyngeal- Nectar Cup Delayed swallow initiation-vallecula;Pharyngeal residue - valleculae;Pharyngeal residue - pyriform;Pharyngeal residue - posterior pharnyx Pharyngeal -- Pharyngeal- Nectar Straw -- Pharyngeal -- Pharyngeal- Thin Teaspoon -- Pharyngeal -- Pharyngeal- Thin Cup Delayed swallow initiation-vallecula;Delayed swallow initiation-pyriform sinuses;Reduced airway/laryngeal closure;Penetration/Aspiration before swallow;Penetration/Aspiration during swallow;Moderate aspiration;Pharyngeal residue - valleculae Pharyngeal Material enters airway, passes BELOW cords and not ejected out despite cough attempt by patient;Material enters airway, passes BELOW cords without attempt by patient to eject out (silent aspiration) Pharyngeal- Thin Straw -- Pharyngeal -- Pharyngeal- Puree Delayed swallow initiation-vallecula;Pharyngeal residue - valleculae;Pharyngeal residue - pyriform Pharyngeal -- Pharyngeal- Mechanical Soft -- Pharyngeal -- Pharyngeal- Regular -- Pharyngeal -- Pharyngeal- Multi-consistency -- Pharyngeal -- Pharyngeal- Pill -- Pharyngeal -- Pharyngeal Comment --  CHL IP CERVICAL ESOPHAGEAL PHASE  09/05/2019 Cervical Esophageal Phase WFL Pudding Teaspoon -- Pudding Cup -- Honey Teaspoon -- Honey Cup -- Nectar Teaspoon -- Nectar Cup -- Nectar Straw -- Thin Teaspoon -- Thin Cup -- Thin Straw -- Puree -- Mechanical Soft -- Regular -- Multi-consistency -- Pill -- Cervical Esophageal Comment -- Sonia Baller, MA, CCC-SLP Speech Therapy [facility]                LOS: 5 days   Antonieta Pert, MD [facility]  09/07/2019, 1:42 PM

## 2019-09-07 NOTE — Progress Notes (Signed)
Stephen Copeland was off floor this morning for procedure.

## 2019-09-07 NOTE — Progress Notes (Signed)
Nutrition Follow-up  DOCUMENTATION CODES:   Not applicable  INTERVENTION:  - continue Jevity 1.5 @ 50 with 30 ml prostat BID. - will order 200 ml free water QID.  - continue to encourage PO intakes as tolerated.   NUTRITION DIAGNOSIS:   Inadequate oral intake related to dysphagia (s/p stroke) as evidenced by NPO status. -diet advanced to CLD, nectar thick but patient taking very little to none  GOAL:   Patient will meet greater than or equal to 90% of their needs -met with TF regimen  MONITOR:   PO intake, Diet advancement, TF tolerance, Labs, Weight trends  REASON FOR ASSESSMENT:   Consult Enteral/tube feeding initiation and management  ASSESSMENT:   79 y.o. male with history of A. fib cardiomyopathy hypertension previous history of prostate cancer who was recently admitted and discharged last week after having a complicated course after patient was diagnosed with stroke underwent intervention stent placement for stroke and complicated with intracranial hemorrhage and respiratory failure requiring intubation secondary to aspiration pneumonia and during the stay patient also was noted to have bright red blood per rectum for which patient's Eliquis had to be held off and on eventually discharged with no Eliquis but Brilinta presently on Plavix for the stent was noticed to have some bleeding per rectum at the living facility..  Denies any nausea vomiting.  Patient has a PEG tube secondary to dysphagia.  Patient states over the last 3 days been eating solid food.  Patient sleeping at this time with no family/visitors present at this time. Diet advanced from NPO to FLD, thin liquid on 7/2 at 1143 and then changed back to NPO ~2.5 hours later. Diet advanced from NPO to CLD, nectar-thick liquids on 7/3 at 1630 and flow sheet documentation indicates patient consumed 0% of dinner on 7/3 and 0% of breakfast yesterday; no other intakes documented.   PEG was placed on 08/13/19 and patient is  currently receiving Jevity 1.5 @ 50 ml/hr with 30 ml prostat BID. This regimen is providing 2000 kcal, 106 grams protein, 25 grams fiber, and 912 ml free water.   He was last weighed on 7/2. Flow sheet documentation indicates mild edema to BLE.   Per notes: - acute GIB with rectal bleeding s/p colonoscopy--resolved and thought to have been 2/2 diverticular hemorrhage or radiation proctitis - GI recommended psylium BID, 64 oz water/day, and high fiber diet    Labs reviewed; CBGs: 142 and 149 mg/dl. Medications reviewed; 40 mg protonix per PEG/day, 2 g rectal carafate BID.  IVF; D5 @ 30 ml/hr (122 kcal).   Diet Order:   Diet Order            Diet clear liquid Room service appropriate? No; Fluid consistency: Nectar Thick  Diet effective now                 EDUCATION NEEDS:   No education needs have been identified at this time  Skin:  Skin Assessment: Reviewed RN Assessment  Last BM:  7/5--type 7 x2  Height:   Ht Readings from Last 1 Encounters:  09/04/19 '6\' 2"'  (1.88 m)    Weight:   Wt Readings from Last 1 Encounters:  09/04/19 73.9 kg     Estimated Nutritional Needs:  Kcal:  2100-2300 Protein:  100-115g Fluid:  2L/day     Jarome Matin, MS, RD, LDN, CNSC Inpatient Clinical Dietitian RD pager # available in [facility]  After hours/weekend pager # available in [facility]

## 2019-09-07 NOTE — Progress Notes (Signed)
Providing continued bedside support around hospitalization, anxious affect.

## 2019-09-07 NOTE — TOC Progression Note (Signed)
Transition of Care [facility]) - Progression Note    Patient Details  Name: Stephen Copeland MRN: [id number] Date of Birth: [birth year]  [facility] CM/SW Contact  Joaquin Courts, RN Phone Number: 09/07/2019, 12:42 PM  Clinical Narrative:    CM spoke with spouse and confirmed plan for patient to return to [facility] for short term rehab when medically stable.  VM was left for CP rep.     Expected Discharge Plan: [place] Barriers to Discharge: Continued Medical Work up  Expected Discharge Plan and Services Expected Discharge Plan: [place] In-house Referral: Clinical Social Work   Post Acute Care Choice: [place] Living arrangements for the past 2 months: [place] (from [facility])                 DME Arranged: N/A DME Agency: NA       HH Arranged: NA HH Agency: NA         Social Determinants of Health (SDOH) Interventions    Readmission Risk Interventions No flowsheet data found.

## 2019-09-07 NOTE — Progress Notes (Signed)
  Speech Language Pathology Treatment: Dysphagia  Patient Details Name: Stephen Copeland MRN: [id number] DOB: [birth year] Today's Date: 09/07/2019 Time: 4401-0272 SLP Time Calculation (min) (ACUTE ONLY): 24 min  Assessment / Plan / Recommendation Clinical Impression  Pt administered aggressive oral care removing dried secretions from tongue and palate; he refused nectar-thickened liquids despite encouragement; pt educated re: MBS results/safety with swallowing/PEG for nutrition/hydration; pt requesting medicine for tongue and continued to insist on SLP assisting him with his "left impacted nostril." Pt appeared agitated/irritated throughout session with multiple complaints; Nursing informed of requests for pain meds and comfort with various needs.  ST will continue efforts while in acute setting re: swallowing safety/appropriate safest PO intake.  HPI HPI: 79 y.o. 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 Underwent initial clinical swallow evaluation 07/10/19, had a cortak, demonstrated multiple refusals to participate, refused attempts at [facility], and was followed by SLP until 6/7, at which time he was D/Cd from our service due to poor participation and progress.  PEG 6/1 and 6/10. Pt was on a dysphagia 1 diet with honey-thick liquids at time of D/C, but oral intake was minimal. Per notes, he has been tolerating solids in addition to g-tube feedings. GI is following; TF resumed; SLP ordered to assess if he can swallow POs.       SLP Plan  Continue with current plan of care       Recommendations  Diet recommendations: Nectar-thick liquid;Other(comment) (if PO intake requested) Liquids provided via: Teaspoon Medication Administration: Via alternative means Compensations: Slow rate;Small sips/bites;Minimize environmental distractions Postural Changes  and/or Swallow Maneuvers: Seated upright 90 degrees                Oral Care Recommendations: Oral care QID Follow up Recommendations: Skilled Nursing facility SLP Visit Diagnosis: Dysphagia, oropharyngeal phase (R13.12) Plan: Continue with current plan of care                      Elvina Sidle, M.S., CCC-SLP 09/07/2019, 4:55 PM

## 2019-09-07 NOTE — Care Management Important Message (Signed)
Important Message  Patient Details IM Letter given to Nancy Marus RN Case Manager to present to the Patient Name: Stephen Copeland MRN: [id number] Date of Birth: 01-Apr-1940   Medicare Important Message Given:  Yes     Kerin Salen 09/07/2019, 10:14 AM

## 2019-09-08 ENCOUNTER — Observation Stay (HOSPITAL_COMMUNITY)
Admission: EM | Admit: 2019-09-08 | Discharge: 2019-09-09 | Disposition: A | Discharge status: Skilled Nursing Facility | Payer: Medicare Other | Attending: Internal Medicine | Admitting: Internal Medicine

## 2019-09-08 ENCOUNTER — Encounter (HOSPITAL_COMMUNITY): Payer: Self-pay | Admitting: Gastroenterology

## 2019-09-08 ENCOUNTER — Emergency Department (HOSPITAL_COMMUNITY): Payer: Medicare Other

## 2019-09-08 DIAGNOSIS — I4891 Unspecified atrial fibrillation: Secondary | ICD-10-CM | POA: Diagnosis present

## 2019-09-08 DIAGNOSIS — I69391 Dysphagia following cerebral infarction: Principal | ICD-10-CM | POA: Insufficient documentation

## 2019-09-08 DIAGNOSIS — N183 Chronic kidney disease, stage 3 unspecified: Secondary | ICD-10-CM | POA: Diagnosis not present

## 2019-09-08 DIAGNOSIS — I700 Atherosclerosis of aorta: Secondary | ICD-10-CM | POA: Diagnosis not present

## 2019-09-08 DIAGNOSIS — I272 Pulmonary hypertension, unspecified: Secondary | ICD-10-CM | POA: Diagnosis present

## 2019-09-08 DIAGNOSIS — R05 Cough: Secondary | ICD-10-CM | POA: Diagnosis not present

## 2019-09-08 DIAGNOSIS — I509 Heart failure, unspecified: Secondary | ICD-10-CM | POA: Diagnosis not present

## 2019-09-08 DIAGNOSIS — I480 Paroxysmal atrial fibrillation: Secondary | ICD-10-CM | POA: Insufficient documentation

## 2019-09-08 DIAGNOSIS — I10 Essential (primary) hypertension: Secondary | ICD-10-CM | POA: Diagnosis present

## 2019-09-08 DIAGNOSIS — Z9189 Other specified personal risk factors, not elsewhere classified: Secondary | ICD-10-CM

## 2019-09-08 DIAGNOSIS — E785 Hyperlipidemia, unspecified: Secondary | ICD-10-CM | POA: Diagnosis present

## 2019-09-08 DIAGNOSIS — I130 Hypertensive heart and chronic kidney disease with heart failure and stage 1 through stage 4 chronic kidney disease, or unspecified chronic kidney disease: Secondary | ICD-10-CM | POA: Insufficient documentation

## 2019-09-08 DIAGNOSIS — J189 Pneumonia, unspecified organism: Secondary | ICD-10-CM

## 2019-09-08 DIAGNOSIS — R21 Rash and other nonspecific skin eruption: Secondary | ICD-10-CM | POA: Diagnosis not present

## 2019-09-08 DIAGNOSIS — J9811 Atelectasis: Secondary | ICD-10-CM | POA: Diagnosis not present

## 2019-09-08 DIAGNOSIS — I639 Cerebral infarction, unspecified: Secondary | ICD-10-CM | POA: Diagnosis present

## 2019-09-08 DIAGNOSIS — I517 Cardiomegaly: Secondary | ICD-10-CM | POA: Diagnosis not present

## 2019-09-08 DIAGNOSIS — I5022 Chronic systolic (congestive) heart failure: Secondary | ICD-10-CM | POA: Diagnosis present

## 2019-09-08 LAB — GLUCOSE, CAPILLARY
Glucose-Capillary: 128 mg/dL — ABNORMAL HIGH (ref 70–99)
Glucose-Capillary: 148 mg/dL — ABNORMAL HIGH (ref 70–99)
Glucose-Capillary: 157 mg/dL — ABNORMAL HIGH (ref 70–99)

## 2019-09-08 LAB — BASIC METABOLIC PANEL
Anion gap: 11 (ref 5–15)
Anion gap: 12 (ref 5–15)
BUN: 25 mg/dL — ABNORMAL HIGH (ref 8–23)
BUN: 32 mg/dL — ABNORMAL HIGH (ref 8–23)
CO2: 24 mmol/L (ref 22–32)
CO2: 27 mmol/L (ref 22–32)
Calcium: 8.7 mg/dL — ABNORMAL LOW (ref 8.9–10.3)
Calcium: 8.9 mg/dL (ref 8.9–10.3)
Chloride: 104 mmol/L (ref 98–111)
Chloride: 100 mmol/L (ref 98–111)
Creatinine, Ser: 1.03 mg/dL (ref 0.61–1.24)
Creatinine, Ser: 1.17 mg/dL (ref 0.61–1.24)
GFR calc Af Amer: >60 mL/min (ref >60)
GFR calc Af Amer: >60 mL/min (ref >60)
GFR calc non Af Amer: >60 mL/min (ref >60)
GFR calc non Af Amer: 59 mL/min — ABNORMAL LOW (ref >60)
Glucose, Bld: 158 mg/dL — ABNORMAL HIGH (ref 70–99)
Glucose, Bld: 139 mg/dL — ABNORMAL HIGH (ref 70–99)
Potassium: 3.9 mmol/L (ref 3.5–5.1)
Potassium: 4.2 mmol/L (ref 3.5–5.1)
Sodium: 139 mmol/L (ref 135–145)
Sodium: 139 mmol/L (ref 135–145)

## 2019-09-08 LAB — CBC WITH DIFFERENTIAL/PLATELET
Abs Immature Granulocytes: 0.04 10*3/uL (ref 0.00–0.07)
Basophils Absolute: 0.1 10*3/uL (ref 0.0–0.1)
Basophils Relative: 0 %
Eosinophils Absolute: 0.2 10*3/uL (ref 0.0–0.5)
Eosinophils Relative: 1 %
HCT: 35.4 % — ABNORMAL LOW (ref 39.0–52.0)
Hemoglobin: 10.9 g/dL — ABNORMAL LOW (ref 13.0–17.0)
Immature Granulocytes: 0 %
Lymphocytes Relative: 16 %
Lymphs Abs: 2 10*3/uL (ref 0.7–4.0)
MCH: 29.9 pg (ref 26.0–34.0)
MCHC: 30.8 g/dL (ref 30.0–36.0)
MCV: 97.3 fL (ref 80.0–100.0)
Monocytes Absolute: 0.9 10*3/uL (ref 0.1–1.0)
Monocytes Relative: 7 %
Neutro Abs: 9.4 10*3/uL — ABNORMAL HIGH (ref 1.7–7.7)
Neutrophils Relative %: 76 %
Platelets: 285 10*3/uL (ref 150–400)
RBC: 3.64 MIL/uL — ABNORMAL LOW (ref 4.22–5.81)
RDW: 16.9 % — ABNORMAL HIGH (ref 11.5–15.5)
WBC: 12.6 10*3/uL — ABNORMAL HIGH (ref 4.0–10.5)
nRBC: 0 % (ref 0.0–0.2)

## 2019-09-08 LAB — CBC
HCT: 31.4 % — ABNORMAL LOW (ref 39.0–52.0)
Hemoglobin: 9.8 g/dL — ABNORMAL LOW (ref 13.0–17.0)
MCH: 30.2 pg (ref 26.0–34.0)
MCHC: 31.2 g/dL (ref 30.0–36.0)
MCV: 96.9 fL (ref 80.0–100.0)
Platelets: 255 10*3/uL (ref 150–400)
RBC: 3.24 MIL/uL — ABNORMAL LOW (ref 4.22–5.81)
RDW: 16.9 % — ABNORMAL HIGH (ref 11.5–15.5)
WBC: 9 10*3/uL (ref 4.0–10.5)
nRBC: 0 % (ref 0.0–0.2)

## 2019-09-08 LAB — BRAIN NATRIURETIC PEPTIDE: B Natriuretic Peptide: 161.5 pg/mL — ABNORMAL HIGH (ref 0.0–100.0)

## 2019-09-08 LAB — SARS CORONAVIRUS 2 (TAT 6-24 HRS): SARS Coronavirus 2: NEGATIVE

## 2019-09-08 LAB — SARS CORONAVIRUS 2 BY RT PCR (HOSPITAL ORDER, PERFORMED IN ~~LOC~~ HOSPITAL LAB): SARS Coronavirus 2: NEGATIVE

## 2019-09-08 MED ORDER — ACETAMINOPHEN 325 MG PO TABS
650.0000 mg | ORAL_TABLET | Freq: Once | ORAL | Status: AC
  Administered 2019-09-09: 650 mg via ORAL
  Filled 2019-09-08: qty 2

## 2019-09-08 MED ORDER — SUCRALFATE 1 GM/10ML PO SUSP: 2.0000 g | Freq: Two times a day (BID) | ORAL | 0 refills | Status: DC

## 2019-09-08 MED ORDER — SODIUM CHLORIDE 0.9 % IV SOLN: INTRAVENOUS | Status: DC

## 2019-09-08 MED ORDER — VANCOMYCIN HCL IN DEXTROSE 1-5 GM/200ML-% IV SOLN
1000.0000 mg | Freq: Once | INTRAVENOUS | Status: AC
  Administered 2019-09-09: 1000 mg via INTRAVENOUS
  Filled 2019-09-08: qty 200

## 2019-09-08 MED ORDER — SODIUM CHLORIDE 0.9 % IV SOLN
2.0000 g | Freq: Once | INTRAVENOUS | Status: AC
  Administered 2019-09-09: 2 g via INTRAVENOUS
  Filled 2019-09-08: qty 2

## 2019-09-08 NOTE — Plan of Care (Signed)
  Problem: Education: Goal: Ability to identify signs and symptoms of gastrointestinal bleeding will improve Outcome: Adequate for Discharge   Problem: Bowel/Gastric: Goal: Will show no signs and symptoms of gastrointestinal bleeding Outcome: Adequate for Discharge   Problem: Fluid Volume: Goal: Will show no signs and symptoms of excessive bleeding Outcome: Adequate for Discharge   Problem: Clinical Measurements: Goal: Complications related to the disease process, condition or treatment will be avoided or minimized Outcome: Adequate for Discharge   Problem: Education: Goal: Knowledge of General Education information will improve Description: Including pain rating scale, medication(s)/side effects and non-pharmacologic comfort measures Outcome: Adequate for Discharge   Problem: Health Behavior/Discharge Planning: Goal: Ability to manage health-related needs will improve Outcome: Adequate for Discharge   Problem: Clinical Measurements: Goal: Ability to maintain clinical measurements within normal limits will improve Outcome: Adequate for Discharge Goal: Will remain free from infection Outcome: Adequate for Discharge Goal: Diagnostic test results will improve Outcome: Adequate for Discharge Goal: Respiratory complications will improve Outcome: Adequate for Discharge Goal: Cardiovascular complication will be avoided Outcome: Adequate for Discharge   Problem: Activity: Goal: Risk for activity intolerance will decrease Outcome: Adequate for Discharge   Problem: Nutrition: Goal: Adequate nutrition will be maintained Outcome: Adequate for Discharge   Problem: Coping: Goal: Level of anxiety will decrease Outcome: Adequate for Discharge   Problem: Elimination: Goal: Will not experience complications related to bowel motility Outcome: Adequate for Discharge Goal: Will not experience complications related to urinary retention Outcome: Adequate for Discharge   Problem: Pain  Managment: Goal: General experience of comfort will improve Outcome: Adequate for Discharge   Problem: Safety: Goal: Ability to remain free from injury will improve Outcome: Adequate for Discharge   Problem: Skin Integrity: Goal: Risk for impaired skin integrity will decrease Outcome: Adequate for Discharge   

## 2019-09-08 NOTE — ED Triage Notes (Signed)
Pt coming from [place] and was just discharged one hour ago, pt states that his congested started when he arrived back to the facility Pt doesn't wear oxygen at the facility or at home

## 2019-09-08 NOTE — Progress Notes (Signed)
A consult was received from an ED physician for Cefepime & Vancomycin per pharmacy dosing.  The patient's profile has been reviewed for ht/wt/allergies/indication/available labs.   A one time order has been placed for Cefepime 2gm & Vancomycin 1gm IV.  Further antibiotics/pharmacy consults should be ordered by admitting physician if indicated.                       Thank you, Netta Cedars PharmD 09/08/2019  11:27 PM

## 2019-09-08 NOTE — Progress Notes (Signed)
Report called to [place] at this time

## 2019-09-08 NOTE — Discharge Summary (Signed)
Physician Discharge Summary  Stephen Copeland FGH:[id number] DOB: November 06, 1940 DOA: 09/01/2019  PCP: [name]., MD  Admit date: 09/01/2019 Discharge date: 09/08/2019  Admitted From: SNF Disposition:  SNF  Recommendations for Outpatient Follow-up:  1. Follow up with PCP in 1-2 weeks 2. Please obtain BMP/CBC in one week 3. Please follow up on the following pending results:  Home Health: No Equipment/Devices: None  Discharge Condition: Stable Code Status: Full code Diet recommendation:  Diet Order            Diet clear liquid Room service appropriate? No; Fluid consistency: Nectar Thick  Diet effective now                  Brief/Interim Summary: 79 y.o.malewithhistory of A. fib cardiomyopathy hypertension previous history of prostate cancer who was recently admitted and discharged last week after having a complicated course after patient was diagnosed with stroke underwent intervention stent placement for stroke and complicated with intracranial hemorrhage and respiratory failure requiring intubation secondary to aspiration pneumonia and during the stay patient also was noted to have bright red blood per rectum for which patient's Eliquis had to be held off and on eventually discharged with no Eliquis but Brilinta presently on Plavix for the stent was noticed to have some bleeding per rectum at the living facility.. Denies any nausea vomiting. Patient has a PEG tube secondary to dysphagia. Patient states over the last 3 days been eating solid food. During the last day patient also had an EGD which did not show any acute.  ED Course:In the ER patient again was noted to have some bleeding per rectum. Hemodynamically stable. Lab work show hemoglobin is around 1.5 increased from baseline from recent stay which was 9. Creatinine increased from baseline around 1.4 and is around 1.5. Covid test was negative. Patient admitted for acute GI bleed. 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 He had one episode of rectal bleeding and for GI started on Carafate enema for proctitis and no recurrence since then.  He is tolerating Plavix.  He remains deconditioned weak and frail and plan is to to discharge back to skilled nursing facility.  Discharge Diagnoses:  Principal Problem:   Lower GI bleed Active Problems:   Paroxysmal atrial fibrillation (HCC)   Congestive dilated cardiomyopathy (HCC)   History of prostate cancer   SAH (subarachnoid hemorrhage) (HCC) s/p clot retrieval and stent placement   Hematochezia  Acute GI bleeding with rectal bleeding.  Status post colonoscopy- recent bleeding from either diverticular hemorrhage or radiation proctitis. Patient is back on Plavix.1 episode of bleeding 7/3 discussed with GI continue Plavix, started on sucralfate enemas 20 mL of 10% suspension twice a day for 4 weeks or until the bleeding has stopped.  Overall doing well no recurrence of bleeding tolerating diet.  Paroxysmal atrial fibrillation:Continue metoprolol and Plavix  Recent stroke with intervention with stent placement:Patient on Plavix.Neurology advised Eliquis can be held but Plavix has to be continued.  If continues to have bleeding issues will need to talk with interventional neurologist Dr Alfonso Ramus regarding recommendation.continue Plavix.  Anemia of chronic disease: Hemoglobin stable 9 to 10 g.  Monitor. Recent Labs  Lab 09/04/19 0334 09/05/19 0333 09/06/19 0416 09/07/19 0429 09/08/19 0331  HGB 9.6* 9.6* 10.0* 10.9* 9.8*  HCT 31.4* 31.3* 32.0* 67.8* 93.8*   Metabolic acidosis: Resolved. Dilated cardiomyopathy: Compensated.  Dysphagia PEG tube in place. Continue clear liquid diet with thickener per speech, continue tube feeding as per dietitian with Shanda Howells  1.5 at 50 mill per hour with 30 ml prostat BID. tolerating well.  Hypoglycemia: From poor intake, resolved  Diet Orders (From admission, onward)    Start      Ordered   09/05/19 1630  Diet clear liquid Room service appropriate? No; Fluid consistency: Nectar Thick  Diet effective now       Comments: Meds crushed in puree  Question Answer Comment  Room service appropriate? No   Fluid consistency: Nectar Thick      09/05/19 1630          Consults:  GI  Subjective: Resting well interested to go back to skilled nursing facility as soon as possible.  No other new complaints.  No bleeding.  Discharge Exam: Vitals:   09/07/19 2016 09/08/19 0554  BP: 115/68 126/64  Pulse: 99 87  Resp: 18 19  Temp: 98.1 F (36.7 C) 98.7 F (37.1 C)  SpO2: 100% 96%   General: Pt is alert, awake, not in acute distress Cardiovascular: RRR, S1/S2 +, no rubs, no gallops Respiratory: CTA bilaterally, no wheezing, no rhonchi Abdominal: Soft, NT, ND, bowel sounds +. PEG+ Extremities: no edema, no cyanosis  Discharge Instructions  Discharge Instructions    Discharge instructions   Complete by: As directed    Diet Orders (From admission, onward)    Start     Ordered  09/05/19 1630   Diet clear liquid Room service appropriate? No; Fluid consistency: Nectar Thick  Diet effective now      Comments: Meds crushed in puree Question Answer Comment Room service appropriate? No  Fluid consistency: Nectar Thick   Continue tube feeding and nutrition supplement as instructed in the discharge summary       Please call call MD or return to ER for similar or worsening recurring problem that brought you to hospital or if any fever,nausea/vomiting,abdominal pain, uncontrolled pain, chest pain,  shortness of breath or any other alarming symptoms.  Please follow-up your doctor as instructed in a week time and call the office for appointment.  Please avoid alcohol, smoking, or any other illicit substance and maintain healthy habits including taking your regular medications as prescribed.  You were cared for by a hospitalist during your hospital stay. If you have  any questions about your discharge medications or the care you received while you were in the hospital after you are discharged, you can call the unit and ask to speak with the hospitalist on call if the hospitalist that took care of you is not available.  Once you are discharged, your primary care physician will handle any further medical issues. Please note that NO REFILLS for any discharge medications will be authorized once you are discharged, as it is imperative that you return to your primary care physician (or establish a relationship with a primary care physician if you do not have one) for your aftercare needs so that they can reassess your need for medications and monitor your lab values   Increase activity slowly   Complete by: As directed      Allergies as of 09/08/2019   No Known Allergies     Medication List    STOP taking these medications   ticagrelor 90 MG Tabs tablet Commonly known as: BRILINTA     TAKE these medications   acetaminophen 325 MG tablet Commonly known as: TYLENOL Take 650 mg by mouth every 6 (six) hours as needed for mild pain.   clopidogrel 75 MG tablet Commonly known as: PLAVIX Take 75  mg by mouth in the morning and at bedtime.   feeding supplement (ENSURE ENLIVE) Liqd Take 237 mLs by mouth 2 (two) times daily between meals.   feeding supplement (JEVITY 1.5 CAL/FIBER) Liqd Place 1,000 mLs into feeding tube continuous.   feeding supplement (PRO-STAT SUGAR FREE 64) Liqd Place 30 mLs into feeding tube 2 (two) times daily.   free water Soln Place 200 mLs into feeding tube every 4 (four) hours.   Gerhardt's butt cream Crea Apply 1 application topically every 8 (eight) hours as needed for irritation.   hydrALAZINE 25 MG tablet Commonly known as: APRESOLINE Place 1 tablet (25 mg total) into feeding tube every 8 (eight) hours.   lidocaine 5 % ointment Commonly known as: XYLOCAINE Apply 1 application topically 2 (two) times daily as needed  (pain).   metoprolol tartrate 25 mg/10 mL Susp Commonly known as: LOPRESSOR Place 10 mLs (25 mg total) into feeding tube 2 (two) times daily.   pantoprazole sodium 40 mg/20 mL Pack Commonly known as: PROTONIX Place 20 mLs (40 mg total) into feeding tube daily.   Resource ThickenUp Clear Powd Take 120 g by mouth as needed (for honey thick liquid consistency).   sucralfate 1 GM/10ML suspension Commonly known as: CARAFATE Place 20 mLs (2 g total) rectally 2 (two) times daily for 28 days.       Follow-up Information    [name]., MD Follow up in 1 week(s).   Specialty: Internal Medicine Contact information: [place] [address] [phone]        Burnell Blanks, MD .   Specialty: Cardiology Contact information: [address] [place] [address] [phone]              No Known Allergies  The results of significant diagnostics from this hospitalization (including imaging, microbiology, ancillary and laboratory) are listed below for reference.    Microbiology: Recent Results (from the past 240 hour(s))  SARS Coronavirus 2 by RT PCR (hospital order, performed in [facility] hospital lab) Nasopharyngeal Nasopharyngeal Swab     Status: None   Collection Time: 09/02/19  1:21 AM   Specimen: Nasopharyngeal Swab  Result Value Ref Range Status   SARS Coronavirus 2 NEGATIVE NEGATIVE Final    Comment: (NOTE) SARS-CoV-2 target nucleic acids are NOT DETECTED.  The SARS-CoV-2 RNA is generally detectable in upper and lower respiratory specimens during the acute phase of infection. The lowest concentration of SARS-CoV-2 viral copies this assay can detect is 250 copies / mL. A negative result does not preclude SARS-CoV-2 infection and should not be used as the sole basis for treatment or other patient management decisions.  A negative result may occur with improper specimen collection / handling, submission of specimen  other than nasopharyngeal swab, presence of viral mutation(s) within the areas targeted by this assay, and inadequate number of viral copies (<250 copies / mL). A negative result must be combined with clinical observations, patient history, and epidemiological information.  Fact Sheet for Patients:   StrictlyIdeas.no  Fact Sheet for Healthcare Providers: BankingDealers.co.za  This test is not yet approved or  cleared by the [place] FDA and has been authorized for detection and/or diagnosis of SARS-CoV-2 by FDA under an Emergency Use Authorization (EUA).  This EUA will remain in effect (meaning this test can be used) for the duration of the COVID-19 declaration under Section 564(b)(1) of the Act, 21 U.S.C. section 360bbb-3(b)(1), unless the authorization is terminated or revoked  sooner.  Performed at [facility], [place] [address], [place],  [address]     Procedures/Studies: DG Abd 1 View  Result Date: 08/18/2019 CLINICAL DATA:  Nausea and vomiting for 1 day, history of gastric ulcer prostate cancer in hiatal hernia EXAM: ABDOMEN - 1 VIEW COMPARISON:  Numerous priors, most recently IR images 08/13/2019, abdominal radiograph 08/13/2019 FINDINGS: Redemonstration of the left upper quadrant percutaneous gastrostomy tube. Mild gaseous distention of the stomach but without high-grade obstructive bowel gas pattern. Interval transit of the previously demonstrated high attenuation contrast material to the level of the rectum. There is opacification of innumerable right and left-sided colonic diverticula. Stable appearance of metallic radiodensities projecting over the symphysis pubis. Possibly fiducials or brachytherapy implants. No acute osseous or soft tissue abnormality. Stable degenerative changes in the spine and pelvis. IMPRESSION: 1. Mild gaseous distention of the stomach but without high-grade obstructive bowel gas  pattern. 2. Interval transit of the previously demonstrated high attenuation contrast material to the level of the rectum. 3. Extensive colonic diverticulosis. Electronically Signed   By: Lovena Le M.D.   On: 08/18/2019 20:48   IR Replc Gastro/Colonic Tube Percut W/Fluoro  Result Date: 08/13/2019 INDICATION: Displaced percutaneous gastrostomy tube. A 16 French Foley catheter is currently maintaining the tract. EXAM: GASTROSTOMY CATHETER REPLACEMENT MEDICATIONS: None ANESTHESIA/SEDATION: None. CONTRAST:  53mL OMNIPAQUE IOHEXOL 300 MG/ML SOLN - administered into the gastric lumen. FLUOROSCOPY TIME:  Fluoroscopy Time: 0 minutes 6 seconds (1 mGy). COMPLICATIONS: None immediate. PROCEDURE: Informed written consent was obtained from the patient after a thorough discussion of the procedural risks, benefits and alternatives. All questions were addressed. Maximal Sterile Barrier Technique was utilized including caps, mask, sterile gowns, sterile gloves, sterile drape, hand hygiene and skin antiseptic. A timeout was performed prior to the initiation of the procedure. The Foley catheter balloon was deflated and the tube removed. A new 38 French percutaneous gastrostomy tube was inserted through the tract and into the stomach. The retention balloon was inflated and pulled snug against the anterior abdominal wall. The external bumper was fixed in place. Contrast injection was then performed under fluoroscopy. The injected contrast fills the stomach confirming intragastric location. IMPRESSION: Successful exchange for a new 59 French percutaneous gastrostomy tube which is within the stomach. Electronically Signed   By: Jacqulynn Cadet M.D.   On: 08/13/2019 17:00   DG ABDOMEN PEG TUBE LOCATION  Result Date: 08/13/2019 CLINICAL DATA:  PEG tube injection EXAM: ABDOMEN - 1 VIEW COMPARISON:  Portable exam 1103 hours compared to 08/02/2019 FINDINGS: PEG tube was injected with 50 cc of Omnipaque 300. Contrast opacifies the  gastric lumen, duodenal bulb, and duodenal sweep and extends into the proximal jejunal loops. Small duodenal diverticulum identified. No contrast extravasation. Atherosclerotic calcifications aorta. Bones demineralized. IMPRESSION: Replaced PEG tube is within the gastric lumen. Electronically Signed   By: Lavonia Dana M.D.   On: 08/13/2019 11:33   DG CHEST PORT 1 VIEW  Result Date: 08/16/2019 CLINICAL DATA:  Acute ischemic stroke.  Chest pain. EXAM: PORTABLE CHEST 1 VIEW COMPARISON:  08/04/2019 FINDINGS: Grossly unchanged enlarged cardiac silhouette and mediastinal contours with atherosclerotic plaque within thoracic aorta. There is persistent thickening of the right paratracheal stripe presumably secondary prominent vasculature. There is persistent mild elevation/eventration of the right hemidiaphragm with associated right infrahilar and basilar heterogeneous opacities. No definite pleural effusion however the left costophrenic angle is excluded from view. No new focal airspace opacities. No pneumothorax. No evidence of edema. No acute osseous abnormalities. IMPRESSION:  1. Cardiomegaly without superimposed acute cardiopulmonary disease. 2. Chronic elevation of the right hemidiaphragm with associated right basilar atelectasis. Electronically Signed   By: Sandi Mariscal M.D.   On: 08/16/2019 08:43   DG Swallowing Func-Speech Pathology  Result Date: 09/05/2019 Objective Swallowing Evaluation: Type of Study: MBS-Modified Barium Swallow Study  Patient Details Name: Stephen Copeland MRN: [id number] Date of Birth: [birth year] Today's Date: 09/05/2019 Time: SLP Start Time (ACUTE ONLY): 1520 -SLP Stop Time (ACUTE ONLY): 7829 SLP Time Calculation (min) (ACUTE ONLY): 15 min Past Medical History: Past Medical History: Diagnosis Date . Acid reflux  . CHF (congestive heart failure) ([place])  . Coronary artery disease  . Dilated cardiomyopathy ([place])  . Folliculitis  . Gastric ulcer  . Hiatal hernia  . Hyperlipidemia   a. pt is adamantly  against statins. . Hypertension  . Ischemia   a. Pt states he was diagnosed with "ischemia" in the 1990s but does not know further details, denies hx of heart blockage. . Nummular dermatitis  . Prostate cancer ([place])  . Renal disorder  . Scoliosis  . Sinus bradycardia  . Stomach ulcer   a. remote ulcer in the 1990s, no hx of bleeding. Past Surgical History: Past Surgical History: Procedure Laterality Date . ESOPHAGOGASTRODUODENOSCOPY (EGD) WITH PROPOFOL N/A 08/04/2019  Procedure: ESOPHAGOGASTRODUODENOSCOPY (EGD) WITH PROPOFOL;  Surgeon: Jesusita Oka, MD;  Location: [facility];  Service: General;  Laterality: N/A; . INGUINAL HERNIA REPAIR Left  . IR ANGIO INTRA EXTRACRAN SEL COM CAROTID INNOMINATE UNI L MOD SED  07/10/2019 . IR ANGIO VERTEBRAL SEL SUBCLAVIAN INNOMINATE UNI R MOD SED  07/10/2019 . IR CT HEAD LTD  07/10/2019 . IR INTRAVSC STENT CERV CAROTID W/O EMB-PROT MOD SED INC ANGIO  07/10/2019 . IR PERCUTANEOUS ART THROMBECTOMY/INFUSION INTRACRANIAL INC DIAG ANGIO  07/10/2019 . IR REPLC GASTRO/COLONIC TUBE PERCUT W/FLUORO  08/13/2019 . PEG PLACEMENT N/A 08/04/2019  Procedure: PERCUTANEOUS ENDOSCOPIC GASTROSTOMY (PEG) PLACEMENT;  Surgeon: Jesusita Oka, MD;  Location: [facility];  Service: General;  Laterality: N/A; . RADIOLOGY WITH ANESTHESIA N/A 07/09/2019  Procedure: IR WITH ANESTHESIA;  Surgeon: Luanne Bras, MD;  Location: [facility];  Service: Radiology;  Laterality: N/A; HPI: 79 y.o. male with PMH of Afib, systolic CHF, HLD, CAD, HTN, GERD, prostate ca, and recent right MCA infarct 07/09/19, complicated by [facility] two days later, admitted to [facility] from rehab facility for rectal bleeding. Underwent initial clinical swallow evaluation 07/10/19, had a cortak, demonstrated multiple refusals to participate, refused attempts at [facility], and was followed by SLP until 6/7, at which time he was D/Cd from our service due to poor participation and progress.  PEG 6/1 and 6/10. Pt was on a dysphagia 1 diet with honey-thick liquids at time  of D/C, but oral intake was minimal. Per notes, he has been tolerating solids in addition to g-tube feedings. GI is following; TF resumed; SLP ordered to assess if he can swallow POs.  Subjective: alert, agitated, confused Assessment / Plan / Recommendation CHL IP CLINICAL IMPRESSIONS 09/05/2019 Clinical Impression Patient presents with a severe oropharyngeal dysphagia characterized by decreased oral manipulation and transit of puree solids and nectar thick liquids, swallow initiation delays to vallecular sinus with nectar thick liquids and puree solid and to pyriform with thin liquids. Patient exhibited moderate amount of aspiration prior to and during swallow of cup sip of thin liquids which he did not react to until already passing through vocal cords. Patient then started to cough, gag and regurgitate violently and saying he needed oxygen. After  settling down, he did accept a couple sips of nectar thick liquids which did not result in aspiration or penetration but did cause mild vallecular and pyriform sinus residuals. Patient exhibited mild-moderate amount of vallecular sinus residuals with puree solids but this did clear mostly with subsequent swallows. Patient is at a high risk of aspiration and lack of hydration/nutrition. SLP Visit Diagnosis Dysphagia, oropharyngeal phase (R13.12) Attention and concentration deficit following -- Frontal lobe and executive function deficit following -- Impact on safety and function Severe aspiration risk   CHL IP TREATMENT RECOMMENDATION 09/05/2019 Treatment Recommendations Therapy as outlined in treatment plan below   Prognosis 09/05/2019 Prognosis for Safe Diet Advancement Fair Barriers to Reach Goals Cognitive deficits;Behavior;Severity of deficits Barriers/Prognosis Comment patient with agitation and poor compliance/participation CHL IP DIET RECOMMENDATION 09/05/2019 SLP Diet Recommendations Nectar thick liquid Liquid Administration via Cup Medication Administration Other  (Comment) Compensations Slow rate;Small sips/bites;Minimize environmental distractions Postural Changes Remain semi-upright after after feeds/meals (Comment);Seated upright at 90 degrees   CHL IP OTHER RECOMMENDATIONS 09/05/2019 Recommended Consults -- Oral Care Recommendations Oral care BID;Staff/trained caregiver to provide oral care Other Recommendations Order thickener from pharmacy;Prohibited food (jello, ice cream, thin soups);Remove water pitcher;Have oral suction available;Clarify dietary restrictions   CHL IP FOLLOW UP RECOMMENDATIONS 09/05/2019 Follow up Recommendations Skilled Nursing facility   [facility] IP FREQUENCY AND DURATION 09/05/2019 Speech Therapy Frequency (ACUTE ONLY) min 2x/week Treatment Duration 2 weeks      CHL IP ORAL PHASE 09/05/2019 Oral Phase Impaired Oral - Pudding Teaspoon -- Oral - Pudding Cup -- Oral - Honey Teaspoon -- Oral - Honey Cup -- Oral - Nectar Teaspoon -- Oral - Nectar Cup Weak lingual manipulation;Incomplete tongue to palate contact;Reduced posterior propulsion;Holding of bolus Oral - Nectar Straw -- Oral - Thin Teaspoon -- Oral - Thin Cup Incomplete tongue to palate contact;Weak lingual manipulation;Delayed oral transit Oral - Thin Straw -- Oral - Puree Reduced posterior propulsion;Incomplete tongue to palate contact;Weak lingual manipulation;Delayed oral transit;Decreased bolus cohesion;Piecemeal swallowing Oral - Mech Soft -- Oral - Regular -- Oral - Multi-Consistency -- Oral - Pill -- Oral Phase - Comment --  CHL IP PHARYNGEAL PHASE 09/05/2019 Pharyngeal Phase Impaired Pharyngeal- Pudding Teaspoon -- Pharyngeal -- Pharyngeal- Pudding Cup -- Pharyngeal -- Pharyngeal- Honey Teaspoon -- Pharyngeal -- Pharyngeal- Honey Cup -- Pharyngeal -- Pharyngeal- Nectar Teaspoon -- Pharyngeal -- Pharyngeal- Nectar Cup Delayed swallow initiation-vallecula;Pharyngeal residue - valleculae;Pharyngeal residue - pyriform;Pharyngeal residue - posterior pharnyx Pharyngeal -- Pharyngeal- Nectar Straw --  Pharyngeal -- Pharyngeal- Thin Teaspoon -- Pharyngeal -- Pharyngeal- Thin Cup Delayed swallow initiation-vallecula;Delayed swallow initiation-pyriform sinuses;Reduced airway/laryngeal closure;Penetration/Aspiration before swallow;Penetration/Aspiration during swallow;Moderate aspiration;Pharyngeal residue - valleculae Pharyngeal Material enters airway, passes BELOW cords and not ejected out despite cough attempt by patient;Material enters airway, passes BELOW cords without attempt by patient to eject out (silent aspiration) Pharyngeal- Thin Straw -- Pharyngeal -- Pharyngeal- Puree Delayed swallow initiation-vallecula;Pharyngeal residue - valleculae;Pharyngeal residue - pyriform Pharyngeal -- Pharyngeal- Mechanical Soft -- Pharyngeal -- Pharyngeal- Regular -- Pharyngeal -- Pharyngeal- Multi-consistency -- Pharyngeal -- Pharyngeal- Pill -- Pharyngeal -- Pharyngeal Comment --  CHL IP CERVICAL ESOPHAGEAL PHASE 09/05/2019 Cervical Esophageal Phase WFL Pudding Teaspoon -- Pudding Cup -- Honey Teaspoon -- Honey Cup -- Nectar Teaspoon -- Nectar Cup -- Nectar Straw -- Thin Teaspoon -- Thin Cup -- Thin Straw -- Puree -- Mechanical Soft -- Regular -- Multi-consistency -- Pill -- Cervical Esophageal Comment -- Sonia Baller, MA, CCC-SLP Speech Therapy [facility]  Labs: BNP (last 3 results) No results for input(s): BNP in the last 8760 hours. Basic Metabolic Panel: Recent Labs  Lab 09/04/19 0334 09/05/19 0333 09/06/19 0914 09/07/19 0429 09/08/19 0331  NA 141 141 141 137 139  K 3.7 3.7 4.1 3.6 3.9  CL 109 109 106 102 104  CO2 21* 23 24 27 24   GLUCOSE 84 98 98 156* 158*  BUN 30* 26* 18 21 25*  CREATININE 1.27* 1.18 1.17 1.12 1.03  CALCIUM 8.5* 8.8* 8.9 8.9 8.7*  MG  --   --  1.8  --   --    Liver Function Tests: Recent Labs  Lab 09/01/19 2325  AST 36  ALT 63*  ALKPHOS 122  BILITOT 0.7  PROT 7.3  ALBUMIN 3.4*   No results for input(s): LIPASE, AMYLASE in the last 168 hours. No  results for input(s): AMMONIA in the last 168 hours. CBC: Recent Labs  Lab 09/01/19 2325 09/02/19 0331 09/04/19 0334 09/05/19 0333 09/06/19 0416 09/07/19 0429 09/08/19 0331  WBC 9.5   < > 6.6 6.6 6.4 9.7 9.0  NEUTROABS 5.3  --   --   --   --   --   --   HGB 11.5*   < > 9.6* 9.6* 10.0* 10.9* 9.8*  HCT 37.0*   < > 31.4* 31.3* 32.0* 35.4* 31.4*  MCV 97.4   < > 98.4 98.7 96.7 96.7 96.9  PLT 450*   < > 336 312 302 315 255   < > = values in this interval not displayed.   Cardiac Enzymes: No results for input(s): CKTOTAL, CKMB, CKMBINDEX, TROPONINI in the last 168 hours. BNP: Invalid input(s): POCBNP CBG: Recent Labs  Lab 09/07/19 0636 09/07/19 1145 09/07/19 1735 09/08/19 0016 09/08/19 0556  GLUCAP 149* 157* 130* 157* 128*   D-Dimer No results for input(s): DDIMER in the last 72 hours. Hgb A1c No results for input(s): HGBA1C in the last 72 hours. Lipid Profile No results for input(s): CHOL, HDL, LDLCALC, TRIG, CHOLHDL, LDLDIRECT in the last 72 hours. Thyroid function studies No results for input(s): TSH, T4TOTAL, T3FREE, THYROIDAB in the last 72 hours.  Invalid input(s): FREET3 Anemia work up No results for input(s): VITAMINB12, FOLATE, FERRITIN, TIBC, IRON, RETICCTPCT in the last 72 hours. Urinalysis    Component Value Date/Time   COLORURINE YELLOW 08/23/2019 1306   APPEARANCEUR HAZY (A) 08/23/2019 1306   LABSPEC 1.011 08/23/2019 1306   PHURINE 8.0 08/23/2019 1306   GLUCOSEU NEGATIVE 08/23/2019 1306   HGBUR NEGATIVE 08/23/2019 1306   BILIRUBINUR NEGATIVE 08/23/2019 1306   BILIRUBINUR negative 06/12/2016 1146   KETONESUR NEGATIVE 08/23/2019 1306   PROTEINUR NEGATIVE 08/23/2019 1306   UROBILINOGEN 1.0 06/12/2016 1146   NITRITE NEGATIVE 08/23/2019 1306   LEUKOCYTESUR NEGATIVE 08/23/2019 1306   Sepsis Labs Invalid input(s): PROCALCITONIN,  WBC,  LACTICIDVEN Microbiology Recent Results (from the past 240 hour(s))  SARS Coronavirus 2 by RT PCR (hospital order,  performed in [facility] hospital lab) Nasopharyngeal Nasopharyngeal Swab     Status: None   Collection Time: 09/02/19  1:21 AM   Specimen: Nasopharyngeal Swab  Result Value Ref Range Status   SARS Coronavirus 2 NEGATIVE NEGATIVE Final    Comment: (NOTE) SARS-CoV-2 target nucleic acids are NOT DETECTED.  The SARS-CoV-2 RNA is generally detectable in upper and lower respiratory specimens during the acute phase of infection. The lowest concentration of SARS-CoV-2 viral copies this assay can detect is 250 copies / mL. A negative result does not preclude  SARS-CoV-2 infection and should not be used as the sole basis for treatment or other patient management decisions.  A negative result may occur with improper specimen collection / handling, submission of specimen other than nasopharyngeal swab, presence of viral mutation(s) within the areas targeted by this assay, and inadequate number of viral copies (<250 copies / mL). A negative result must be combined with clinical observations, patient history, and epidemiological information.  Fact Sheet for Patients:   StrictlyIdeas.no  Fact Sheet for Healthcare Providers: BankingDealers.co.za  This test is not yet approved or  cleared by the [place] FDA and has been authorized for detection and/or diagnosis of SARS-CoV-2 by FDA under an Emergency Use Authorization (EUA).  This EUA will remain in effect (meaning this test can be used) for the duration of the COVID-19 declaration under Section 564(b)(1) of the Act, 21 U.S.C. section 360bbb-3(b)(1), unless the authorization is terminated or revoked sooner.  Performed at [facility], [place] [address], [place], [place] [address]      Time coordinating discharge: 25  minutes  SIGNED: Antonieta Pert, MD  [facility] 09/08/2019, 11:52 AM  If 7PM-7AM, please contact night-coverage [email or web address]

## 2019-09-08 NOTE — Progress Notes (Signed)
PT Cancellation Note  Patient Details Name: Hawkin Charo MRN: [id number] DOB: 03-Jul-1940   Cancelled Treatment:    Reason Eval/Treat Not Completed: Medical issues which prohibited therapy , recently given an enema. Will check back another time.  Claretha Cooper 09/08/2019, 11:30 AM  [place] Pager [phone] Office [phone]

## 2019-09-08 NOTE — ED Provider Notes (Signed)
[facility] DEPT Provider Note   CSN: [id number] Arrival date & time: 09/08/19  2113     History No chief complaint on file.   Stephen Copeland is a 79 y.o. male.  79 year old male who presents with congestion and cough times several hours.  Patient was just discharged from the hospital today after being admitted for lower GI bleed.  States that he is not had any symptoms since leaving the hospital.  Denies any fever or chills.  States he is not been dyspneic.  No chest or abdominal discomfort.  States he feels that he needs to be on oxygen.  Does have history of CHF        Past Medical History:  Diagnosis Date  . Acid reflux   . CHF (congestive heart failure) ([place])   . Coronary artery disease   . Dilated cardiomyopathy ([place])   . Folliculitis   . Gastric ulcer   . Hiatal hernia   . Hyperlipidemia    a. pt is adamantly against statins.  . Hypertension   . Ischemia    a. Pt states he was diagnosed with "ischemia" in the 1990s but does not know further details, denies hx of heart blockage.  . Nummular dermatitis   . Prostate cancer ([place])   . Renal disorder   . Scoliosis   . Sinus bradycardia   . Stomach ulcer    a. remote ulcer in the 1990s, no hx of bleeding.    Patient Active Problem List   Diagnosis Date Noted  . Hematochezia   . Lower GI bleed 09/02/2019  . SAH (subarachnoid hemorrhage) (HCC) s/p clot retrieval and stent placement 08/06/2019  . Aspiration pneumonia ([place]), likely 08/06/2019  . 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 L BBB (bundle branch block) 08/06/2019  . Prolonged Q-T interval on ECG 08/06/2019  . 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 Frequent loose stools 08/06/2019  . Acute embolic stroke ([place])   . SBO (small bowel obstruction) ([place])   . History of prostate cancer   . Stage 3 chronic kidney disease   . Agitation   . Supplemental oxygen dependent   . Middle cerebral artery embolism,  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  . Acute hypoxemic respiratory failure ([place])   . Acute ischemic stroke ([place]) -R MCA d/t R ICA and M2 occlusion s/p clot retrieval and R ICA stent 07/09/2019  . Vasovagal syncope 07/18/2017  . S/P TURP 07/18/2017  . MRSA bacteremia 07/07/2017  . Pseudomonas sepsis ([place]) 07/07/2017  . Pseudomonas urinary tract infection 07/07/2017  . Coronary artery disease 07/07/2017  . 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 Encounter for therapeutic drug monitoring 04/09/2017  . Right lower lobe pulmonary nodule 12/19/2016  . Noncompliance with treatment plan 12/19/2016  . Bacteremia due to Enterococcus 12/19/2016  . Enterococcus UTI 12/19/2016  . Acute renal failure (ARF) ([place]) 12/18/2016  . Hyperkalemia 12/18/2016  . Hyperbilirubinemia 12/18/2016  . UTI (urinary tract infection) 05/31/2016  . Acute urinary retention 05/31/2016  . Acute kidney injury superimposed on chronic kidney disease ([place]) 05/31/2016  . Generalized weakness 05/31/2016  . Falls 05/31/2016  . Acute systolic CHF (congestive heart failure) ([place])   . Congestive dilated cardiomyopathy ([place])   . Atrial fibrillation with RVR ([place])   . Uncontrolled hypertension   . Pulmonary hypertension ([place])   . Chronic systolic CHF (congestive heart failure) ([place])   . Paroxysmal atrial fibrillation ([place]) 12/19/2015  . Hypertension 12/19/2015  .  New onset a-fib ([place]) 12/19/2015  . Acute CHF ([place]) 12/19/2015  . Hyperglycemia 12/19/2015  . Acid reflux   . Hyperlipidemia   . Sinus bradycardia   . Gastroesophageal reflux disease   . Other hyperlipidemia   . Elevated troponin     Past Surgical History:  Procedure Laterality Date  . BIOPSY  09/04/2019   Procedure: BIOPSY;  Surgeon: Ladene Artist, MD;  Location: [facility];  Service: Gastroenterology;;  . COLONOSCOPY WITH PROPOFOL N/A 09/04/2019   Procedure: COLONOSCOPY WITH PROPOFOL;  Surgeon: Ladene Artist, MD;  Location: [facility];  Service:  Gastroenterology;  Laterality: N/A;  . ESOPHAGOGASTRODUODENOSCOPY (EGD) WITH PROPOFOL N/A 08/04/2019   Procedure: ESOPHAGOGASTRODUODENOSCOPY (EGD) WITH PROPOFOL;  Surgeon: Jesusita Oka, MD;  Location: [facility];  Service: General;  Laterality: N/A;  . INGUINAL HERNIA REPAIR Left   . IR ANGIO INTRA EXTRACRAN SEL COM CAROTID INNOMINATE UNI L MOD SED  07/10/2019  . IR ANGIO VERTEBRAL SEL SUBCLAVIAN INNOMINATE UNI R MOD SED  07/10/2019  . IR CT HEAD LTD  07/10/2019  . IR INTRAVSC STENT CERV CAROTID W/O EMB-PROT MOD SED INC ANGIO  07/10/2019  . IR PERCUTANEOUS ART THROMBECTOMY/INFUSION INTRACRANIAL INC DIAG ANGIO  07/10/2019  . IR REPLC GASTRO/COLONIC TUBE PERCUT W/FLUORO  08/13/2019  . PEG PLACEMENT N/A 08/04/2019   Procedure: PERCUTANEOUS ENDOSCOPIC GASTROSTOMY (PEG) PLACEMENT;  Surgeon: Jesusita Oka, MD;  Location: [facility];  Service: General;  Laterality: N/A;  . RADIOLOGY WITH ANESTHESIA N/A 07/09/2019   Procedure: IR WITH ANESTHESIA;  Surgeon: Luanne Bras, MD;  Location: [facility];  Service: Radiology;  Laterality: N/A;       Family History  Problem Relation Age of Onset  . Heart disease Mother        Further details not reported, died at age 51  . Valvular heart disease Father        H/o MV surgery, diet at age 10    Social History   Tobacco Use  . Smoking status: Never Smoker  . Smokeless tobacco: Never Used  Vaping Use  . Vaping Use: Never used  Substance Use Topics  . Alcohol use: No  . Drug use: No    Home Medications Prior to Admission medications   Medication Sig Start Date End Date Taking? Authorizing Provider  acetaminophen (TYLENOL) 325 MG tablet Take 650 mg by mouth every 6 (six) hours as needed for mild pain.    [provider]  Amino Acids-Protein Hydrolys (FEEDING SUPPLEMENT, PRO-STAT SUGAR FREE 64,) LIQD Place 30 mLs into feeding tube 2 (two) times daily. 08/26/19   Donzetta Starch, NP  clopidogrel (PLAVIX) 75 MG tablet Take 75 mg by mouth in the  morning and at bedtime.    [provider]  feeding supplement, ENSURE ENLIVE, (ENSURE ENLIVE) LIQD Take 237 mLs by mouth 2 (two) times daily between meals. 08/26/19   Donzetta Starch, NP  hydrALAZINE (APRESOLINE) 25 MG tablet Place 1 tablet (25 mg total) into feeding tube every 8 (eight) hours. 08/26/19   Donzetta Starch, NP  lidocaine (XYLOCAINE) 5 % ointment Apply 1 application topically 2 (two) times daily as needed (pain). 08/26/19   Donzetta Starch, NP  Maltodextrin-Xanthan Gum (RESOURCE THICKENUP CLEAR) POWD Take 120 g by mouth as needed (for honey thick liquid consistency). 08/26/19   Donzetta Starch, NP  metoprolol tartrate (LOPRESSOR) 25 mg/10 mL SUSP Place 10 mLs (25 mg total) into feeding tube 2 (two) times daily. 08/26/19  Donzetta Starch, NP  Nutritional Supplements (FEEDING SUPPLEMENT, JEVITY 1.5 CAL/FIBER,) LIQD Place 1,000 mLs into feeding tube continuous. 08/26/19   Donzetta Starch, NP  Nystatin (GERHARDT'S BUTT CREAM) CREA Apply 1 application topically every 8 (eight) hours as needed for irritation. 08/26/19   Donzetta Starch, NP  pantoprazole sodium (PROTONIX) 40 mg/20 mL PACK Place 20 mLs (40 mg total) into feeding tube daily. 08/26/19   Donzetta Starch, NP  sucralfate (CARAFATE) 1 GM/10ML suspension Place 20 mLs (2 g total) rectally 2 (two) times daily for 28 days. 09/08/19 10/06/19  Antonieta Pert, MD  Water For Irrigation, Sterile (FREE WATER) SOLN Place 200 mLs into feeding tube every 4 (four) hours. 08/26/19   Donzetta Starch, NP    Allergies    Patient has no known allergies.  Review of Systems   Review of Systems  All other systems reviewed and are negative.   Physical Exam Updated Vital Signs There were no vitals taken for this visit.  Physical Exam Vitals and nursing note reviewed.  Constitutional:      General: He is not in acute distress.    Appearance: Normal appearance. He is well-developed. He is not toxic-appearing.  HENT:     Head: Normocephalic and atraumatic.    Eyes:     General: Lids are normal.     Conjunctiva/sclera: Conjunctivae normal.     Pupils: Pupils are equal, round, and reactive to light.  Neck:     Thyroid: No thyroid mass.     Trachea: No tracheal deviation.  Cardiovascular:     Rate and Rhythm: Tachycardia present. Rhythm irregular.     Heart sounds: Normal heart sounds. No murmur heard.  No gallop.   Pulmonary:     Effort: Pulmonary effort is normal. No respiratory distress.     Breath sounds: Normal breath sounds. No stridor. No decreased breath sounds, wheezing, rhonchi or rales.  Abdominal:     General: Bowel sounds are normal. There is no distension.     Palpations: Abdomen is soft.     Tenderness: There is no abdominal tenderness. There is no rebound.  Musculoskeletal:        General: No tenderness. Normal range of motion.     Cervical back: Normal range of motion and neck supple.  Skin:    General: Skin is warm and dry.     Findings: No abrasion or rash.  Neurological:     Mental Status: He is alert and oriented to person, place, and time.     GCS: GCS eye subscore is 4. GCS verbal subscore is 5. GCS motor subscore is 6.     Cranial Nerves: No cranial nerve deficit.     Sensory: No sensory deficit.  Psychiatric:        Speech: Speech normal.        Behavior: Behavior normal.     ED Results / Procedures / Treatments   Labs (all labs ordered are listed, but only abnormal results are displayed) Labs Reviewed  BASIC METABOLIC PANEL  CBC WITH DIFFERENTIAL/PLATELET  BRAIN NATRIURETIC PEPTIDE    EKG None  Radiology No results found.  Procedures Procedures (including critical care time)  Medications Ordered in ED Medications  0.9 %  sodium chloride infusion (has no administration in time range)    ED Course  I have reviewed the triage vital signs and the nursing notes.  Pertinent labs & imaging results that were available during my care of the patient were  reviewed by me and considered in my  medical decision making (see chart for details).    MDM Rules/Calculators/A&P                          Patient's chest x-ray consistent with pneumonia.  He is febrile to 100.8.  Mild leukocytosis.  We started on IV antibiotics and admitted back to the hospital service. Final Clinical Impression(s) / ED Diagnoses Final diagnoses:  None    Rx / DC Orders ED Discharge Orders    None       Lacretia Leigh, MD 09/08/19 2310

## 2019-09-08 NOTE — TOC Progression Note (Signed)
Transition of Care [facility]) - Progression Note    Patient Details  Name: Less Woolsey MRN: [id number] Date of Birth: Apr 25, 1940  [facility] CM/SW Contact  Joaquin Courts, RN Phone Number: 09/08/2019, 1:40 PM  Clinical Narrative:    CM spoke with [facility] rep who states patient can return to facility today. Rep states no need for updated FL2. CM faxed over DC summary.  Patient will discharge to room 120.  PTAR transportation arranged and voicemail left for spouse notifying her of planned discharge.     Expected Discharge Plan: [place] Barriers to Discharge: No Barriers Identified  Expected Discharge Plan and Services Expected Discharge Plan: [place] In-house Referral: Clinical Social Work   Post Acute Care Choice: [place] Living arrangements for the past 2 months: [place] (from [place] [facility]) Expected Discharge Date: 09/08/19               DME Arranged: N/A DME Agency: NA       HH Arranged: NA HH Agency: NA         Social Determinants of Health (SDOH) Interventions    Readmission Risk Interventions No flowsheet data found.

## 2019-09-09 ENCOUNTER — Other Ambulatory Visit: Payer: Self-pay

## 2019-09-09 DIAGNOSIS — I639 Cerebral infarction, unspecified: Secondary | ICD-10-CM

## 2019-09-09 DIAGNOSIS — M2550 Pain in unspecified joint: Secondary | ICD-10-CM | POA: Diagnosis not present

## 2019-09-09 DIAGNOSIS — I130 Hypertensive heart and chronic kidney disease with heart failure and stage 1 through stage 4 chronic kidney disease, or unspecified chronic kidney disease: Secondary | ICD-10-CM | POA: Diagnosis not present

## 2019-09-09 DIAGNOSIS — I517 Cardiomegaly: Secondary | ICD-10-CM | POA: Diagnosis not present

## 2019-09-09 DIAGNOSIS — F411 Generalized anxiety disorder: Secondary | ICD-10-CM | POA: Diagnosis not present

## 2019-09-09 DIAGNOSIS — I10 Essential (primary) hypertension: Secondary | ICD-10-CM | POA: Diagnosis not present

## 2019-09-09 DIAGNOSIS — J90 Pleural effusion, not elsewhere classified: Secondary | ICD-10-CM | POA: Diagnosis not present

## 2019-09-09 DIAGNOSIS — R1312 Dysphagia, oropharyngeal phase: Secondary | ICD-10-CM | POA: Diagnosis present

## 2019-09-09 DIAGNOSIS — N183 Chronic kidney disease, stage 3 unspecified: Secondary | ICD-10-CM | POA: Diagnosis not present

## 2019-09-09 DIAGNOSIS — I5022 Chronic systolic (congestive) heart failure: Secondary | ICD-10-CM

## 2019-09-09 DIAGNOSIS — I69891 Dysphagia following other cerebrovascular disease: Secondary | ICD-10-CM | POA: Diagnosis not present

## 2019-09-09 DIAGNOSIS — K449 Diaphragmatic hernia without obstruction or gangrene: Secondary | ICD-10-CM | POA: Diagnosis not present

## 2019-09-09 DIAGNOSIS — R5381 Other malaise: Secondary | ICD-10-CM | POA: Diagnosis not present

## 2019-09-09 DIAGNOSIS — I808 Phlebitis and thrombophlebitis of other sites: Secondary | ICD-10-CM | POA: Diagnosis not present

## 2019-09-09 DIAGNOSIS — D62 Acute posthemorrhagic anemia: Secondary | ICD-10-CM | POA: Diagnosis present

## 2019-09-09 DIAGNOSIS — T8089XA Other complications following infusion, transfusion and therapeutic injection, initial encounter: Secondary | ICD-10-CM | POA: Diagnosis not present

## 2019-09-09 DIAGNOSIS — I5021 Acute systolic (congestive) heart failure: Secondary | ICD-10-CM | POA: Diagnosis not present

## 2019-09-09 DIAGNOSIS — G9341 Metabolic encephalopathy: Secondary | ICD-10-CM | POA: Diagnosis not present

## 2019-09-09 DIAGNOSIS — I110 Hypertensive heart disease with heart failure: Secondary | ICD-10-CM | POA: Diagnosis present

## 2019-09-09 DIAGNOSIS — T82898A Other specified complication of vascular prosthetic devices, implants and grafts, initial encounter: Secondary | ICD-10-CM | POA: Diagnosis not present

## 2019-09-09 DIAGNOSIS — Z7901 Long term (current) use of anticoagulants: Secondary | ICD-10-CM | POA: Diagnosis not present

## 2019-09-09 DIAGNOSIS — R1310 Dysphagia, unspecified: Secondary | ICD-10-CM | POA: Diagnosis not present

## 2019-09-09 DIAGNOSIS — I2510 Atherosclerotic heart disease of native coronary artery without angina pectoris: Secondary | ICD-10-CM | POA: Diagnosis present

## 2019-09-09 DIAGNOSIS — A0472 Enterocolitis due to Clostridium difficile, not specified as recurrent: Secondary | ICD-10-CM | POA: Diagnosis not present

## 2019-09-09 DIAGNOSIS — Z931 Gastrostomy status: Secondary | ICD-10-CM | POA: Diagnosis not present

## 2019-09-09 DIAGNOSIS — F0390 Unspecified dementia without behavioral disturbance: Secondary | ICD-10-CM | POA: Diagnosis not present

## 2019-09-09 DIAGNOSIS — Z8546 Personal history of malignant neoplasm of prostate: Secondary | ICD-10-CM | POA: Diagnosis not present

## 2019-09-09 DIAGNOSIS — I480 Paroxysmal atrial fibrillation: Secondary | ICD-10-CM | POA: Diagnosis not present

## 2019-09-09 DIAGNOSIS — Z7401 Bed confinement status: Secondary | ICD-10-CM | POA: Diagnosis not present

## 2019-09-09 DIAGNOSIS — K627 Radiation proctitis: Secondary | ICD-10-CM | POA: Diagnosis present

## 2019-09-09 DIAGNOSIS — R05 Cough: Secondary | ICD-10-CM | POA: Diagnosis not present

## 2019-09-09 DIAGNOSIS — Z9189 Other specified personal risk factors, not elsewhere classified: Secondary | ICD-10-CM | POA: Diagnosis not present

## 2019-09-09 DIAGNOSIS — N179 Acute kidney failure, unspecified: Secondary | ICD-10-CM | POA: Diagnosis not present

## 2019-09-09 DIAGNOSIS — J984 Other disorders of lung: Secondary | ICD-10-CM | POA: Diagnosis not present

## 2019-09-09 DIAGNOSIS — M25561 Pain in right knee: Secondary | ICD-10-CM | POA: Diagnosis not present

## 2019-09-09 DIAGNOSIS — Z8673 Personal history of transient ischemic attack (TIA), and cerebral infarction without residual deficits: Secondary | ICD-10-CM | POA: Diagnosis not present

## 2019-09-09 DIAGNOSIS — M79631 Pain in right forearm: Secondary | ICD-10-CM | POA: Diagnosis not present

## 2019-09-09 DIAGNOSIS — R454 Irritability and anger: Secondary | ICD-10-CM | POA: Diagnosis not present

## 2019-09-09 DIAGNOSIS — G459 Transient cerebral ischemic attack, unspecified: Secondary | ICD-10-CM | POA: Diagnosis not present

## 2019-09-09 DIAGNOSIS — R000 Tachycardia, unspecified: Secondary | ICD-10-CM | POA: Diagnosis not present

## 2019-09-09 DIAGNOSIS — I4891 Unspecified atrial fibrillation: Secondary | ICD-10-CM | POA: Diagnosis present

## 2019-09-09 DIAGNOSIS — R0989 Other specified symptoms and signs involving the circulatory and respiratory systems: Secondary | ICD-10-CM | POA: Diagnosis not present

## 2019-09-09 DIAGNOSIS — R58 Hemorrhage, not elsewhere classified: Secondary | ICD-10-CM | POA: Diagnosis not present

## 2019-09-09 DIAGNOSIS — I69391 Dysphagia following cerebral infarction: Secondary | ICD-10-CM

## 2019-09-09 DIAGNOSIS — I429 Cardiomyopathy, unspecified: Secondary | ICD-10-CM | POA: Diagnosis present

## 2019-09-09 DIAGNOSIS — J986 Disorders of diaphragm: Secondary | ICD-10-CM | POA: Diagnosis not present

## 2019-09-09 DIAGNOSIS — M6281 Muscle weakness (generalized): Secondary | ICD-10-CM | POA: Diagnosis not present

## 2019-09-09 DIAGNOSIS — F05 Delirium due to known physiological condition: Secondary | ICD-10-CM | POA: Diagnosis present

## 2019-09-09 DIAGNOSIS — R451 Restlessness and agitation: Secondary | ICD-10-CM | POA: Diagnosis not present

## 2019-09-09 DIAGNOSIS — I6930 Unspecified sequelae of cerebral infarction: Secondary | ICD-10-CM | POA: Diagnosis not present

## 2019-09-09 DIAGNOSIS — F331 Major depressive disorder, recurrent, moderate: Secondary | ICD-10-CM | POA: Diagnosis not present

## 2019-09-09 DIAGNOSIS — D500 Iron deficiency anemia secondary to blood loss (chronic): Secondary | ICD-10-CM | POA: Diagnosis present

## 2019-09-09 DIAGNOSIS — R197 Diarrhea, unspecified: Secondary | ICD-10-CM | POA: Diagnosis not present

## 2019-09-09 DIAGNOSIS — R0602 Shortness of breath: Secondary | ICD-10-CM | POA: Diagnosis not present

## 2019-09-09 DIAGNOSIS — D649 Anemia, unspecified: Secondary | ICD-10-CM | POA: Diagnosis not present

## 2019-09-09 DIAGNOSIS — I509 Heart failure, unspecified: Secondary | ICD-10-CM | POA: Diagnosis not present

## 2019-09-09 DIAGNOSIS — E876 Hypokalemia: Secondary | ICD-10-CM | POA: Diagnosis not present

## 2019-09-09 DIAGNOSIS — E860 Dehydration: Secondary | ICD-10-CM | POA: Diagnosis not present

## 2019-09-09 DIAGNOSIS — Z79899 Other long term (current) drug therapy: Secondary | ICD-10-CM | POA: Diagnosis not present

## 2019-09-09 DIAGNOSIS — J189 Pneumonia, unspecified organism: Secondary | ICD-10-CM

## 2019-09-09 DIAGNOSIS — K625 Hemorrhage of anus and rectum: Secondary | ICD-10-CM | POA: Diagnosis not present

## 2019-09-09 DIAGNOSIS — J690 Pneumonitis due to inhalation of food and vomit: Secondary | ICD-10-CM | POA: Diagnosis present

## 2019-09-09 DIAGNOSIS — K921 Melena: Secondary | ICD-10-CM | POA: Diagnosis not present

## 2019-09-09 DIAGNOSIS — Y95 Nosocomial condition: Secondary | ICD-10-CM | POA: Diagnosis present

## 2019-09-09 DIAGNOSIS — R69 Illness, unspecified: Secondary | ICD-10-CM | POA: Diagnosis not present

## 2019-09-09 DIAGNOSIS — B372 Candidiasis of skin and nail: Secondary | ICD-10-CM | POA: Diagnosis not present

## 2019-09-09 DIAGNOSIS — Z23 Encounter for immunization: Secondary | ICD-10-CM | POA: Diagnosis not present

## 2019-09-09 DIAGNOSIS — I5189 Other ill-defined heart diseases: Secondary | ICD-10-CM | POA: Diagnosis not present

## 2019-09-09 DIAGNOSIS — N400 Enlarged prostate without lower urinary tract symptoms: Secondary | ICD-10-CM | POA: Diagnosis present

## 2019-09-09 DIAGNOSIS — J9611 Chronic respiratory failure with hypoxia: Secondary | ICD-10-CM | POA: Diagnosis present

## 2019-09-09 DIAGNOSIS — R0902 Hypoxemia: Secondary | ICD-10-CM | POA: Diagnosis not present

## 2019-09-09 DIAGNOSIS — L22 Diaper dermatitis: Secondary | ICD-10-CM | POA: Diagnosis not present

## 2019-09-09 DIAGNOSIS — I255 Ischemic cardiomyopathy: Secondary | ICD-10-CM | POA: Diagnosis not present

## 2019-09-09 DIAGNOSIS — Z9119 Patient's noncompliance with other medical treatment and regimen: Secondary | ICD-10-CM | POA: Diagnosis not present

## 2019-09-09 DIAGNOSIS — E785 Hyperlipidemia, unspecified: Secondary | ICD-10-CM | POA: Diagnosis not present

## 2019-09-09 DIAGNOSIS — R195 Other fecal abnormalities: Secondary | ICD-10-CM | POA: Diagnosis not present

## 2019-09-09 DIAGNOSIS — K922 Gastrointestinal hemorrhage, unspecified: Secondary | ICD-10-CM | POA: Diagnosis not present

## 2019-09-09 DIAGNOSIS — R531 Weakness: Secondary | ICD-10-CM | POA: Diagnosis not present

## 2019-09-09 DIAGNOSIS — R0600 Dyspnea, unspecified: Secondary | ICD-10-CM | POA: Diagnosis not present

## 2019-09-09 DIAGNOSIS — F329 Major depressive disorder, single episode, unspecified: Secondary | ICD-10-CM | POA: Diagnosis present

## 2019-09-09 DIAGNOSIS — M25562 Pain in left knee: Secondary | ICD-10-CM | POA: Diagnosis not present

## 2019-09-09 DIAGNOSIS — Z66 Do not resuscitate: Secondary | ICD-10-CM | POA: Diagnosis present

## 2019-09-09 LAB — CBC WITH DIFFERENTIAL/PLATELET
Abs Immature Granulocytes: 0.02 10*3/uL (ref 0.00–0.07)
Basophils Absolute: 0.1 10*3/uL (ref 0.0–0.1)
Basophils Relative: 1 %
Eosinophils Absolute: 0.5 10*3/uL (ref 0.0–0.5)
Eosinophils Relative: 5 %
HCT: 31.6 % — ABNORMAL LOW (ref 39.0–52.0)
Hemoglobin: 9.7 g/dL — ABNORMAL LOW (ref 13.0–17.0)
Immature Granulocytes: 0 %
Lymphocytes Relative: 21 %
Lymphs Abs: 2 10*3/uL (ref 0.7–4.0)
MCH: 29.5 pg (ref 26.0–34.0)
MCHC: 30.7 g/dL (ref 30.0–36.0)
MCV: 96 fL (ref 80.0–100.0)
Monocytes Absolute: 1.1 10*3/uL — ABNORMAL HIGH (ref 0.1–1.0)
Monocytes Relative: 12 %
Neutro Abs: 5.6 10*3/uL (ref 1.7–7.7)
Neutrophils Relative %: 61 %
Platelets: 256 10*3/uL (ref 150–400)
RBC: 3.29 MIL/uL — ABNORMAL LOW (ref 4.22–5.81)
RDW: 16.8 % — ABNORMAL HIGH (ref 11.5–15.5)
WBC: 9.2 10*3/uL (ref 4.0–10.5)
nRBC: 0 % (ref 0.0–0.2)

## 2019-09-09 LAB — BASIC METABOLIC PANEL
Anion gap: 8 (ref 5–15)
BUN: 31 mg/dL — ABNORMAL HIGH (ref 8–23)
CO2: 26 mmol/L (ref 22–32)
Calcium: 8.7 mg/dL — ABNORMAL LOW (ref 8.9–10.3)
Chloride: 105 mmol/L (ref 98–111)
Creatinine, Ser: 1.06 mg/dL (ref 0.61–1.24)
GFR calc Af Amer: >60 mL/min (ref >60)
GFR calc non Af Amer: >60 mL/min (ref >60)
Glucose, Bld: 113 mg/dL — ABNORMAL HIGH (ref 70–99)
Potassium: 3.8 mmol/L (ref 3.5–5.1)
Sodium: 139 mmol/L (ref 135–145)

## 2019-09-09 LAB — MRSA PCR SCREENING: MRSA by PCR: POSITIVE — AB

## 2019-09-09 LAB — HIV ANTIBODY (ROUTINE TESTING W REFLEX): HIV Screen 4th Generation wRfx: NONREACTIVE

## 2019-09-09 LAB — LACTIC ACID, PLASMA: Lactic Acid, Venous: 1 mmol/L (ref 0.5–1.9)

## 2019-09-09 MED ORDER — METOPROLOL TARTRATE 25 MG/10 ML ORAL SUSPENSION
25.0000 mg | Freq: Two times a day (BID) | ORAL | Status: DC
  Administered 2019-09-09 (×2): 25 mg
  Filled 2019-09-09 (×2): qty 10

## 2019-09-09 MED ORDER — CLOPIDOGREL BISULFATE 75 MG PO TABS
75.0000 mg | ORAL_TABLET | Freq: Every evening | ORAL | Status: DC
  Administered 2019-09-09: 75 mg via ORAL
  Filled 2019-09-09: qty 1

## 2019-09-09 MED ORDER — ACETAMINOPHEN 325 MG PO TABS
650.0000 mg | ORAL_TABLET | Freq: Four times a day (QID) | ORAL | Status: DC | PRN
  Administered 2019-09-09 (×2): 650 mg via ORAL
  Filled 2019-09-09 (×2): qty 2

## 2019-09-09 MED ORDER — SODIUM CHLORIDE 0.9 % IV SOLN
2.0000 g | INTRAVENOUS | Status: DC
  Administered 2019-09-09: 2 g via INTRAVENOUS
  Filled 2019-09-09: qty 2

## 2019-09-09 MED ORDER — HYDRALAZINE HCL 25 MG PO TABS
25.0000 mg | ORAL_TABLET | Freq: Three times a day (TID) | ORAL | Status: DC
  Administered 2019-09-09 (×2): 25 mg
  Filled 2019-09-09 (×3): qty 1

## 2019-09-09 MED ORDER — ENSURE ENLIVE PO LIQD
237.0000 mL | Freq: Two times a day (BID) | ORAL | Status: DC
  Administered 2019-09-09 (×2): 237 mL via ORAL

## 2019-09-09 MED ORDER — METRONIDAZOLE IN NACL 5-0.79 MG/ML-% IV SOLN
500.0000 mg | Freq: Three times a day (TID) | INTRAVENOUS | Status: DC
  Administered 2019-09-09: 500 mg via INTRAVENOUS
  Filled 2019-09-09: qty 100

## 2019-09-09 MED ORDER — SODIUM CHLORIDE 0.9 % IV SOLN
500.0000 mg | INTRAVENOUS | Status: DC
  Administered 2019-09-09: 500 mg via INTRAVENOUS
  Filled 2019-09-09 (×2): qty 500

## 2019-09-09 MED ORDER — SODIUM CHLORIDE 0.9 % IV SOLN: INTRAVENOUS | Status: DC

## 2019-09-09 MED ORDER — RESOURCE THICKENUP CLEAR PO POWD
1.0000 | ORAL | Status: DC | PRN
  Filled 2019-09-09: qty 125

## 2019-09-09 MED ORDER — AMOXICILLIN-POT CLAVULANATE 250-62.5 MG/5ML PO SUSR: 500.0000 mg | Freq: Three times a day (TID) | ORAL | 0 refills | Status: AC

## 2019-09-09 MED ORDER — PRO-STAT SUGAR FREE PO LIQD
30.0000 mL | Freq: Two times a day (BID) | ORAL | Status: DC
  Administered 2019-09-09 (×2): 30 mL
  Filled 2019-09-09 (×2): qty 30

## 2019-09-09 MED ORDER — PANTOPRAZOLE SODIUM 40 MG PO PACK
40.0000 mg | PACK | Freq: Every day | ORAL | Status: DC
  Administered 2019-09-09: 40 mg
  Filled 2019-09-09: qty 20

## 2019-09-09 MED ORDER — SUCRALFATE 1 GM/10ML PO SUSP
2.0000 g | Freq: Two times a day (BID) | ORAL | Status: DC
  Administered 2019-09-09 (×2): 2 g via RECTAL
  Filled 2019-09-09 (×2): qty 20

## 2019-09-09 MED ORDER — FREE WATER
200.0000 mL | Status: DC
  Administered 2019-09-09 (×5): 200 mL

## 2019-09-09 NOTE — Care Management CC44 (Signed)
Condition Code 44 Documentation Completed  Patient Details  Name: Stephen Copeland MRN: [id number] Date of Birth: 14-Dec-1940   Condition Code 44 given:    Patient signature on Condition Code 44 notice:    Documentation of 2 MD's agreement:    Code 44 added to claim:       Leeroy Cha, RN 09/09/2019, 3:11 PM

## 2019-09-09 NOTE — Assessment & Plan Note (Addendum)
-   performed on 07/07/19 - now on Plavix (insurance issues with [facility]; not on Eliquis 2/2 recent GIB)

## 2019-09-09 NOTE — Assessment & Plan Note (Signed)
-   baseline creat 1-1.1, currently at baseline - avoid nephrotoxic agents as able

## 2019-09-09 NOTE — TOC Transition Note (Addendum)
Transition of Care [facility]) - CM/SW Discharge Note   Patient Details  Name: Stephen Copeland MRN: [id number] Date of Birth: 12/14/1940  [facility] CM/SW Contact:  Leeroy Cha, RN Phone Number: 09/09/2019, 2:00 PM   Clinical Narrative:    Patient transferred back to [facility] via ptar . ptar called at 1400. Patient now refusing to go to [place] due to care given there. Wants to go to [place]. [facility]-if has not had covid injections can not come at this time due to no isolation beds. Informed patient of this will run the area and see if anyone has a bed. Covid is negative as of [id number].  Final next level of care: Skilled Nursing Facility Barriers to Discharge: No Barriers Identified   Patient Goals and CMS Choice        Discharge Placement                       Discharge Plan and Services                                     Social Determinants of Health (SDOH) Interventions     Readmission Risk Interventions No flowsheet data found.

## 2019-09-09 NOTE — NC FL2 (Signed)
[place] LEVEL OF CARE SCREENING TOOL     IDENTIFICATION  Patient Name: Stephen Copeland Birthdate: 12-03-1940 Sex: male Admission Date (Current Location): 09/08/2019  [facility] and [place] Number:  Herbalist and Address:  [facility],  [place], [place]      Provider Number: [id number]  Attending Physician Name and Address:  Dwyane Dee, MD  Relative Name and Phone Number:  Maximus Hoffert [phone]    Current Level of Care: Hospital Recommended Level of Care: [place] Prior Approval Number:    Date Approved/Denied:   PASRR Number: [id number]  Discharge Plan: SNF    Current Diagnoses: Patient Active Problem List   Diagnosis Date Noted  . At high risk for aspiration 09/09/2019  . Atrial fibrillation ([place]) 09/09/2019  . Hematochezia   . Lower GI bleed 09/02/2019  . SAH (subarachnoid hemorrhage) (HCC) s/p clot retrieval and stent placement 08/06/2019  . Aspiration pneumonia ([place]), likely 08/06/2019  . 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 L BBB (bundle branch block) 08/06/2019  . Prolonged Q-T interval on ECG 08/06/2019  . 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 Frequent loose stools 08/06/2019  . Acute embolic stroke ([place])   . SBO (small bowel obstruction) ([place])   . History of prostate cancer   . Stage 3 chronic kidney disease   . Agitation   . Supplemental oxygen dependent   . 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 Acute hypoxemic respiratory failure ([place])   . Acute ischemic stroke ([place]) -R MCA d/t R ICA and M2 occlusion s/p clot retrieval and R ICA stent 07/09/2019  . Vasovagal syncope 07/18/2017  . S/P TURP 07/18/2017  . Coronary artery disease 07/07/2017  . 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 Encounter for therapeutic drug monitoring 04/09/2017  . Right lower lobe pulmonary nodule 12/19/2016  . Noncompliance with  treatment plan 12/19/2016  . Acute renal failure (ARF) ([place]) 12/18/2016  . Hyperkalemia 12/18/2016  . Hyperbilirubinemia 12/18/2016  . UTI (urinary tract infection) 05/31/2016  . Acute urinary retention 05/31/2016  . Acute kidney injury superimposed on chronic kidney disease ([place]) 05/31/2016  . Generalized weakness 05/31/2016  . Falls 05/31/2016  . Acute systolic CHF (congestive heart failure) ([place])   . Congestive dilated cardiomyopathy ([place])   . Uncontrolled hypertension   . Pulmonary hypertension ([place])   . Chronic systolic CHF (congestive heart failure) ([place])   . Paroxysmal atrial fibrillation ([place]) 12/19/2015  . Hypertension 12/19/2015  . Acute CHF ([place]) 12/19/2015  . Hyperglycemia 12/19/2015  . Acid reflux   . Hyperlipidemia   . Sinus bradycardia   . Gastroesophageal reflux disease   . Other hyperlipidemia   . Elevated troponin     Orientation RESPIRATION BLADDER Height & Weight     Time, Situation, Place, Self  Normal Incontinent Weight: 70.8 kg Height:  6\' 2"  (188 cm)  BEHAVIORAL SYMPTOMS/MOOD NEUROLOGICAL BOWEL NUTRITION STATUS      Incontinent Diet (regular)  AMBULATORY STATUS COMMUNICATION OF NEEDS Skin   Extensive Assist Verbally Normal                       Personal Care Assistance Level of Assistance  Bathing, Feeding, Dressing Bathing Assistance: Maximum assistance Feeding assistance: Maximum assistance Dressing Assistance: Maximum assistance     Functional Limitations Info  Sight, Hearing, Speech Sight Info: Adequate Hearing Info: Adequate Speech Info: Impaired  SPECIAL CARE FACTORS FREQUENCY  PT (By licensed PT), OT (By licensed OT)     PT Frequency: 5 x weekly OT Frequency: 5 x weekly     Speech Therapy Frequency: 5x weekly      Contractures Contractures Info: Not present    Additional Factors Info  Code Status Code Status Info: full Allergies Info: nka           Current Medications (09/09/2019):  This is the current  hospital active medication list Current Facility-Administered Medications  Medication Dose Route Frequency Provider Last Rate Last Admin  . 0.9 %  sodium chloride infusion   Intravenous Continuous Donnamae Jude, MD 50 mL/hr at 09/09/19 0348 New Bag at 09/09/19 0348  . acetaminophen (TYLENOL) tablet 650 mg  650 mg Oral Q6H PRN Lang Snow, FNP   650 mg at 09/09/19 4166  . azithromycin (ZITHROMAX) 500 mg in sodium chloride 0.9 % 250 mL IVPB  500 mg Intravenous Q24H Donnamae Jude, MD 250 mL/hr at 09/09/19 0525 500 mg at 09/09/19 0525  . cefTRIAXone (ROCEPHIN) 2 g in sodium chloride 0.9 % 100 mL IVPB  2 g Intravenous Q24H Donnamae Jude, MD 200 mL/hr at 09/09/19 0952 2 g at 09/09/19 0952  . clopidogrel (PLAVIX) tablet 75 mg  75 mg Oral QPM Donnamae Jude, MD      . feeding supplement (ENSURE ENLIVE) (ENSURE ENLIVE) liquid 237 mL  237 mL Oral BID BM Donnamae Jude, MD   237 mL at 09/09/19 1415  . feeding supplement (PRO-STAT SUGAR FREE 64) liquid 30 mL  30 mL Per Tube BID Donnamae Jude, MD   30 mL at 09/09/19 1006  . free water 200 mL  200 mL Per Tube Q4H Donnamae Jude, MD   200 mL at 09/09/19 1415  . hydrALAZINE (APRESOLINE) tablet 25 mg  25 mg Per Tube Q8H Donnamae Jude, MD   25 mg at 09/09/19 1411  . metoprolol tartrate (LOPRESSOR) 25 mg/10 mL oral suspension 25 mg  25 mg Per Tube BID Donnamae Jude, MD   25 mg at 09/09/19 1006  . metroNIDAZOLE (FLAGYL) IVPB 500 mg  500 mg Intravenous Lenise Arena, MD 100 mL/hr at 09/09/19 1001 500 mg at 09/09/19 1001  . pantoprazole sodium (PROTONIX) 40 mg/20 mL oral suspension 40 mg  40 mg Per Tube Daily Donnamae Jude, MD   40 mg at 09/09/19 1005  . Resource ThickenUp Clear 120 g  1 Can Oral PRN Donnamae Jude, MD      . sucralfate (CARAFATE) 1 GM/10ML suspension 2 g  2 g Rectal BID Donnamae Jude, MD   2 g at 09/09/19 1006     Discharge Medications: Please see discharge summary for a list of discharge medications.  Relevant Imaging  Results:  Relevant Lab Results:   Additional Information SS# [place], Jaice Digioia Lynn, RN

## 2019-09-09 NOTE — H&P (Signed)
History and Physical    Stephen Copeland MOQ:[id number] DOB: November 07, 1940 DOA: 09/08/2019  PCP: [name]., MD  Patient coming from: SNF  I have personally briefly reviewed patient's old medical records in [place]  Chief Complaint: Cough, shortness of breath  HPI: Stephen Copeland is a 79 y.o. male with medical history significant of A. fib, cardiomyopathy, hypertension, prostate cancer, who returns for his third admission in the past few months.  He was actually discharged today from the hospital back to the SNF following an admission for GI bleed (suspected to be radiation proctitis versus diverticular bleed).  This was a readmission after a complicated stroke where he underwent intervention with stent placement followed by intracranial hemorrhage and respiratory failure followed by aspiration pneumonia and reintubation.  After being discharged today and on arrival to the SNF he developed worsening cough productive of a moderate amount of sputum and shortness of breath.  ED Course: In the ED he was noted to have temp of 100.8 and elevated white blood cell count of 12.6, he was mildly anemic at 10.9, BNP was essentially normal, BNP was elevated at 161.5.  We are asked to admit for hospital-acquired pneumonia.  Review of Systems: As per HPI otherwise 10 point review of systems negative.   Past Medical History:  Diagnosis Date  . Acid reflux   . CHF (congestive heart failure) ([place])   . Coronary artery disease   . Dilated cardiomyopathy ([place])   . Folliculitis   . Gastric ulcer   . Hiatal hernia   . Hyperlipidemia    a. pt is adamantly against statins.  . Hypertension   . Ischemia    a. Pt states he was diagnosed with "ischemia" in the 1990s but does not know further details, denies hx of heart blockage.  . Nummular dermatitis   . Prostate cancer ([place])   . Renal disorder   . Scoliosis   . Sinus bradycardia   . Stomach ulcer    a. remote ulcer in the 1990s, no hx of  bleeding.    Past Surgical History:  Procedure Laterality Date  . BIOPSY  09/04/2019   Procedure: BIOPSY;  Surgeon: Ladene Artist, MD;  Location: [facility];  Service: Gastroenterology;;  . COLONOSCOPY WITH PROPOFOL N/A 09/04/2019   Procedure: COLONOSCOPY WITH PROPOFOL;  Surgeon: Ladene Artist, MD;  Location: [facility];  Service: Gastroenterology;  Laterality: N/A;  . ESOPHAGOGASTRODUODENOSCOPY (EGD) WITH PROPOFOL N/A 08/04/2019   Procedure: ESOPHAGOGASTRODUODENOSCOPY (EGD) WITH PROPOFOL;  Surgeon: Jesusita Oka, MD;  Location: [facility];  Service: General;  Laterality: N/A;  . INGUINAL HERNIA REPAIR Left   . IR ANGIO INTRA EXTRACRAN SEL COM CAROTID INNOMINATE UNI L MOD SED  07/10/2019  . IR ANGIO VERTEBRAL SEL SUBCLAVIAN INNOMINATE UNI R MOD SED  07/10/2019  . IR CT HEAD LTD  07/10/2019  . IR INTRAVSC STENT CERV CAROTID W/O EMB-PROT MOD SED INC ANGIO  07/10/2019  . IR PERCUTANEOUS ART THROMBECTOMY/INFUSION INTRACRANIAL INC DIAG ANGIO  07/10/2019  . IR REPLC GASTRO/COLONIC TUBE PERCUT W/FLUORO  08/13/2019  . PEG PLACEMENT N/A 08/04/2019   Procedure: PERCUTANEOUS ENDOSCOPIC GASTROSTOMY (PEG) PLACEMENT;  Surgeon: Jesusita Oka, MD;  Location: [facility];  Service: General;  Laterality: N/A;  . RADIOLOGY WITH ANESTHESIA N/A 07/09/2019   Procedure: IR WITH ANESTHESIA;  Surgeon: Luanne Bras, MD;  Location: [facility];  Service: Radiology;  Laterality: N/A;     reports that he has never smoked. He has never used smokeless tobacco.  He reports that he does not drink alcohol and does not use drugs.  No Known Allergies  Family History  Problem Relation Age of Onset  . Heart disease Mother        Further details not reported, died at age 59  . Valvular heart disease Father        H/o MV surgery, diet at age 79     Prior to Admission medications   Medication Sig Start Date End Date Taking? Authorizing Provider  acetaminophen (TYLENOL) 325 MG tablet Take 650 mg by mouth every 6 (six)  hours as needed for mild pain.   Yes [provider]  Amino Acids-Protein Hydrolys (FEEDING SUPPLEMENT, PRO-STAT SUGAR FREE 64,) LIQD Place 30 mLs into feeding tube 2 (two) times daily. 08/26/19  Yes Donzetta Starch, NP  clopidogrel (PLAVIX) 75 MG tablet Take 75 mg by mouth in the morning and at bedtime.   Yes [provider]  feeding supplement, ENSURE ENLIVE, (ENSURE ENLIVE) LIQD Take 237 mLs by mouth 2 (two) times daily between meals. 08/26/19  Yes Donzetta Starch, NP  hydrALAZINE (APRESOLINE) 25 MG tablet Place 1 tablet (25 mg total) into feeding tube every 8 (eight) hours. 08/26/19  Yes Donzetta Starch, NP  lidocaine (XYLOCAINE) 5 % ointment Apply 1 application topically 2 (two) times daily as needed (pain). 08/26/19  Yes Donzetta Starch, NP  Maltodextrin-Xanthan Gum (RESOURCE THICKENUP CLEAR) POWD Take 120 g by mouth as needed (for honey thick liquid consistency). 08/26/19  Yes Donzetta Starch, NP  metoprolol tartrate (LOPRESSOR) 25 mg/10 mL SUSP Place 10 mLs (25 mg total) into feeding tube 2 (two) times daily. 08/26/19  Yes Donzetta Starch, NP  Nystatin (GERHARDT'S BUTT CREAM) CREA Apply 1 application topically every 8 (eight) hours as needed for irritation. 08/26/19  Yes Donzetta Starch, NP  pantoprazole sodium (PROTONIX) 40 mg/20 mL PACK Place 20 mLs (40 mg total) into feeding tube daily. 08/26/19  Yes Donzetta Starch, NP  sucralfate (CARAFATE) 1 GM/10ML suspension Place 20 mLs (2 g total) rectally 2 (two) times daily for 28 days. 09/08/19 10/06/19 Yes Antonieta Pert, MD  Water For Irrigation, Sterile (FREE WATER) SOLN Place 200 mLs into feeding tube every 4 (four) hours. 08/26/19  Yes Donzetta Starch, NP  Nutritional Supplements (FEEDING SUPPLEMENT, JEVITY 1.5 CAL/FIBER,) LIQD Place 1,000 mLs into feeding tube continuous. 08/26/19   Donzetta Starch, NP    Physical Exam: Vitals:   09/08/19 2151 09/08/19 2300  BP: 131/81 121/85  Pulse: (!) 123 (!) 119  Resp: 20 18  Temp: (!) 100.8 F (38.2 C)     TempSrc: Rectal   SpO2: 98% 95%    Constitutional: NAD, calm, comfortable Eyes: PERRL, lids and conjunctivae normal ENMT: Mucous membranes are moist. Posterior pharynx clear of any exudate or lesions.Normal dentition.  Neck: normal, supple, no masses, no thyromegaly Respiratory: clear to auscultation bilaterally, no wheezing, no crackles. Normal respiratory effort. No accessory muscle use.  Cardiovascular: irregular rate and rhythm, no murmurs / rubs / gallops. No extremity edema. 2+ pedal pulses. No carotid bruits.  Abdomen: no tenderness, no masses palpated. No hepatosplenomegaly. Bowel sounds positive.  There is a PEG tube in place Musculoskeletal: no clubbing / cyanosis. No joint deformity upper and lower extremities. Good ROM, no contractures. Normal muscle tone.  Skin: no rashes, lesions, ulcers. No induration Neurologic: Dysarthric.  Psychiatric: Normal judgment and insight. Alert and oriented x 3. Normal mood.   Labs on Admission:  I have personally reviewed following labs and imaging studies  CBC: Recent Labs  Lab 09/05/19 0333 09/06/19 0416 09/07/19 0429 09/08/19 0331 09/08/19 2155  WBC 6.6 6.4 9.7 9.0 12.6*  NEUTROABS  --   --   --   --  9.4*  HGB 9.6* 10.0* 10.9* 9.8* 10.9*  HCT 31.3* 32.0* 35.4* 31.4* 35.4*  MCV 98.7 96.7 96.7 96.9 97.3  PLT 312 302 315 255 671   Basic Metabolic Panel: Recent Labs  Lab 09/05/19 0333 09/06/19 0914 09/07/19 0429 09/08/19 0331 09/08/19 2155  NA 141 141 137 139 139  K 3.7 4.1 3.6 3.9 4.2  CL 109 106 102 104 100  CO2 23 24 27 24 27   GLUCOSE 98 98 156* 158* 139*  BUN 26* 18 21 25* 32*  CREATININE 1.18 1.17 1.12 1.03 1.17  CALCIUM 8.8* 8.9 8.9 8.7* 8.9  MG  --  1.8  --   --   --     CBG: Recent Labs  Lab 09/07/19 1145 09/07/19 1735 09/08/19 0016 09/08/19 0556 09/08/19 1153  GLUCAP 157* 130* 157* 128* 148*   Urine analysis:    Component Value Date/Time   COLORURINE YELLOW 08/23/2019 1306   APPEARANCEUR HAZY (A)  08/23/2019 1306   LABSPEC 1.011 08/23/2019 1306   PHURINE 8.0 08/23/2019 1306   GLUCOSEU NEGATIVE 08/23/2019 1306   HGBUR NEGATIVE 08/23/2019 1306   BILIRUBINUR NEGATIVE 08/23/2019 1306   BILIRUBINUR negative 06/12/2016 1146   KETONESUR NEGATIVE 08/23/2019 1306   PROTEINUR NEGATIVE 08/23/2019 1306   UROBILINOGEN 1.0 06/12/2016 1146   NITRITE NEGATIVE 08/23/2019 1306   LEUKOCYTESUR NEGATIVE 08/23/2019 1306    Radiological Exams on Admission: DG Chest Port 1 View  Result Date: 09/08/2019 CLINICAL DATA:  79 year old male with cough. EXAM: PORTABLE CHEST 1 VIEW COMPARISON:  Chest radiograph dated 08/16/2019. FINDINGS: There is mild eventration of the right hemidiaphragm. There are bibasilar streaky densities, likely atelectasis. Infiltrate is less likely but not excluded clinical correlation is recommended. No lobar consolidation, pleural effusion, or pneumothorax. Stable mild cardiomegaly. Atherosclerotic calcification of the aortic arch. No acute osseous pathology. Amorphous calcification in the right upper quadrant likely represent contrast coated colonic diverticula. IMPRESSION: Bibasilar atelectasis versus less likely infiltrate. Electronically Signed   By: Anner Crete M.D.   On: 09/08/2019 22:39    EKG: Independently reviewed.  Left bundle branch block, left ventricular hypertrophy, ectopic atrial beats  Assessment/Plan Active Problems:   Paroxysmal atrial fibrillation (HCC)   Hypertension   Hyperlipidemia   Pulmonary hypertension (HCC)   Chronic systolic CHF (congestive heart failure) ([place])   Acute ischemic stroke ([place]) -R MCA d/t R ICA and M2 occlusion s/p clot retrieval and R ICA stent   Stage 3 chronic kidney disease   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   Healthcare-associated pneumonia  Hospital-acquired pneumonia Presumed, chest x-ray is not that impressive however he does have elevated WBC, fever, cough  History of aspiration recently Status post cefepime and vanc in the  ED Continue antibiotics Sputum culture  Paroxysmal A. Fib Stopped his Eliquis at his last admission supposed to be on Brilinta but his insurance would not cover that, now on Plavix  Recent GI bleed Stable hemoglobin Continue rectal Carafate Continue Protonix  Recent stroke Persistent dysarthria, dysphagia Tube feedings with free water resumed Still needing ongoing therapies  Chronic kidney disease Avoid nephrotoxic agents Creatinine is stable  Hypertension Continue Lopressor Continue hydralazine  Hyperlipidemia Low-cholesterol diet  HFrEF Continue Lopressor Last EF was 35 to 40%  in early May  DVT prophylaxis: SCD/Compression stockings Code Status: Full code confirmed with patient Family Communication: Patient and wife at bedside Disposition Plan: SNF Consults called: None Admission status: Inpatient due to need for ongoing antibiotics   Donnamae Jude MD [facility]  If 7PM-7AM, please contact night-coverage 09/09/2019, 12:24 AM

## 2019-09-09 NOTE — Assessment & Plan Note (Addendum)
-   s/p SLP eval: rec's nectar thick liquids - continue PEG feeds Jevity 1.5 @ 50 with 30 ml prostat BID

## 2019-09-09 NOTE — Progress Notes (Signed)
Chaplain referred by nursing for support around d/c to [facility].  Familiar with pt from recent admission on [place].    Provided support with pt and spouse, orienting pt to focusing on one step at a time in his recovery.

## 2019-09-09 NOTE — Care Management Obs Status (Signed)
[facility] NOTIFICATION   Patient Details  Name: Stephen Copeland MRN: [id number] Date of Birth: 11-03-1940   Medicare Observation Status Notification Given:       Leeroy Cha, RN 09/09/2019, 3:11 PM

## 2019-09-09 NOTE — ED Notes (Signed)
Nurse not available for report

## 2019-09-09 NOTE — Assessment & Plan Note (Signed)
-   no longer on Eliquis, 2/2 GIB - continue plavix

## 2019-09-09 NOTE — Progress Notes (Signed)
Patient was originally to go to [place] but refused.  Now is going to [facility].

## 2019-09-09 NOTE — Progress Notes (Signed)
Pt discharged with PITAR and care packet. Belongings taken with pt. A&Ox4 at time of discharge, in stable condition.

## 2019-09-09 NOTE — Discharge Summary (Signed)
Physician Discharge Summary  Francesco Provencal CHY:[id number] DOB: 1941-03-01 DOA: 09/08/2019  PCP: [name]., MD  Admit date: 09/08/2019 Discharge date: 09/09/2019  Admitted From: SNF Disposition:  SNF Admitting physician: Dr. Kennon Rounds Discharging physician: Dwyane Dee, MD  Recommendations for Outpatient Follow-up:  1. Complete course of Augmentin   Patient discharged to SNF in Discharge Condition: stable CODE STATUS:     Code Status Orders  (From admission, onward)         Start     Ordered   09/09/19 0305  Full code  Continuous        09/09/19 0304        Code Status History    Date Active Date Inactive Code Status Order ID Comments User Context   09/02/2019 0317 09/08/2019 2112 Full Code [id number]  Rise Patience, MD Inpatient   07/10/2019 0058 07/13/2019 1130 Full Code [id number]  Luanne Bras, MD Inpatient   07/09/2019 2133 07/10/2019 0058 Full Code [id number]  Amie Portland, MD ED   12/18/2016 2142 12/23/2016 0011 Full Code [id number]  Reubin Milan, MD Inpatient   05/31/2016 0505 06/06/2016 1756 Full Code [id number]  Norval Morton, MD ED   12/19/2015 1459 12/22/2015 1459 Full Code [id number]  Black, Lezlie Octave, NP ED   Advance Care Planning Activity    Advance Directive Documentation     Most Recent Value  Type of Advance Directive Healthcare Power of Attorney  Pre-existing out of facility DNR order (yellow form or pink MOST form) --  "MOST" Form in Place? --     Diet recommendation:  Diet Orders (From admission, onward)    Start     Ordered   09/09/19 0305  Diet Heart Room service appropriate? Yes; Fluid consistency: Nectar Thick  Diet effective now       Question Answer Comment  Room service appropriate? Yes   Fluid consistency: Nectar Thick      09/09/19 0304          Hospital Course:  Kilan Banfill is a 79 y.o. male with medical history significant of A. fib, cardiomyopathy, hypertension, prostate cancer, who returned for his third  admission in the past few months. He was discharged from the hospital on 09/08/2019 and returned the same evening.  There was concern for worsening cough and sputum production, and shortness of breath.  He underwent work-up to rule out underlying pneumonia or possible aspiration event.  CXR was overall relatively clear and his symptoms improved overnight from admission.  He was discharged on a course of Augmentin to complete for empiric coverage in case of underlying aspiration.  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 At high risk for aspiration 09/09/2019  . 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 Stage 3 chronic kidney disease   . Acute ischemic stroke (  [place]) -R MCA d/t R ICA and M2 occlusion s/p clot retrieval and R ICA stent 07/09/2019  . Pulmonary hypertension ([place])   . Chronic systolic CHF (congestive heart failure) ([place])   . Paroxysmal atrial fibrillation ([place]) 12/19/2015  . Hypertension 12/19/2015  . Hyperlipidemia     Resolved Hospital Problems   Diagnosis Date Noted Date Resolved  . Hospital-acquired pneumonia 09/09/2019 09/09/2019    Discharge Instructions    Increase activity slowly   Complete by: As directed      Allergies as of 09/09/2019   No Known Allergies     Medication List    STOP taking these medications   Resource ThickenUp Clear  Powd     TAKE these medications   acetaminophen 325 MG tablet Commonly known as: TYLENOL Take 650 mg by mouth every 6 (six) hours as needed for mild pain.   amoxicillin-clavulanate 250-62.5 MG/5ML suspension Commonly known as: AUGMENTIN Place 10 mLs (500 mg total) into feeding tube 3 (three) times daily for 5 days.   clopidogrel 75 MG tablet Commonly known as: PLAVIX Take 75 mg by mouth in the morning and at bedtime.   feeding supplement (JEVITY 1.5 CAL/FIBER) Liqd Place 1,000 mLs into feeding tube continuous. What changed: Another medication with the same name was removed. Continue taking this medication, and follow the directions you see here.   feeding supplement (PRO-STAT SUGAR FREE 64) Liqd Place 30 mLs into feeding tube 2 (two) times daily.   free water Soln Place 200 mLs into feeding tube every 4 (four) hours.   Gerhardt's butt cream Crea Apply 1 application topically every 8 (eight) hours as needed for irritation.   hydrALAZINE 25 MG tablet Commonly known as: APRESOLINE Place 1 tablet (25 mg total) into feeding tube every 8 (eight) hours.   lidocaine 5 % ointment Commonly known as: XYLOCAINE Apply 1 application topically 2 (two) times daily as needed (pain).   metoprolol tartrate 25 mg/10 mL Susp Commonly known as: LOPRESSOR Place 10 mLs (25 mg total) into feeding tube 2 (two) times daily.   pantoprazole sodium 40 mg/20 mL Pack Commonly known as: PROTONIX Place 20 mLs (40 mg total) into feeding tube daily.   sucralfate 1 GM/10ML suspension Commonly known as: CARAFATE Place 20 mLs (2 g total) rectally 2 (two) times daily for 28 days.       No Known Allergies  Future appointments: n/a  Consultations:  n/a   Procedures/Studies: DG Abd 1 View  Result Date: 08/18/2019 CLINICAL DATA:  Nausea and vomiting for 1 day, history of gastric ulcer prostate cancer in hiatal hernia EXAM: ABDOMEN - 1 VIEW COMPARISON:  Numerous priors, most recently IR images  08/13/2019, abdominal radiograph 08/13/2019 FINDINGS: Redemonstration of the left upper quadrant percutaneous gastrostomy tube. Mild gaseous distention of the stomach but without high-grade obstructive bowel gas pattern. Interval transit of the previously demonstrated high attenuation contrast material to the level of the rectum. There is opacification of innumerable right and left-sided colonic diverticula. Stable appearance of metallic radiodensities projecting over the symphysis pubis. Possibly fiducials or brachytherapy implants. No acute osseous or soft tissue abnormality. Stable degenerative changes in the spine and pelvis. IMPRESSION: 1. Mild gaseous distention of the stomach but without high-grade obstructive bowel gas pattern. 2. Interval transit of the previously demonstrated high attenuation contrast material to the level of the rectum. 3. Extensive colonic diverticulosis. Electronically Signed   By: Lovena Le M.D.   On: 08/18/2019 20:48   IR Replc Gastro/Colonic Tube Percut W/Fluoro  Result Date: 08/13/2019 INDICATION: Displaced percutaneous gastrostomy tube. A 16 French Foley catheter is currently maintaining the tract. EXAM: GASTROSTOMY CATHETER REPLACEMENT MEDICATIONS: None ANESTHESIA/SEDATION: None. CONTRAST:  46mL OMNIPAQUE IOHEXOL 300 MG/ML SOLN - administered into the gastric lumen. FLUOROSCOPY TIME:  Fluoroscopy Time: 0 minutes 6 seconds (1 mGy). COMPLICATIONS: None immediate. PROCEDURE: Informed written consent was obtained from the patient after a thorough discussion of the procedural risks, benefits and alternatives. All questions were addressed. Maximal Sterile Barrier Technique was utilized including caps, mask, sterile gowns, sterile gloves, sterile drape, hand hygiene and skin antiseptic. A timeout was performed prior to the initiation of the procedure. The Foley catheter balloon was deflated and the tube removed. A new 47 French percutaneous gastrostomy tube was inserted through  the tract and into the stomach. The retention balloon was inflated and pulled snug against the anterior abdominal wall. The external bumper was fixed in place. Contrast injection was then performed under fluoroscopy. The injected contrast fills the stomach confirming intragastric location. IMPRESSION: Successful exchange for a new 11 French percutaneous gastrostomy tube which is within the stomach. Electronically Signed   By: Jacqulynn Cadet M.D.   On: 08/13/2019 17:00   DG ABDOMEN PEG TUBE LOCATION  Result Date: 08/13/2019 CLINICAL DATA:  PEG tube injection EXAM: ABDOMEN - 1 VIEW COMPARISON:  Portable exam 1103 hours compared to 08/02/2019 FINDINGS: PEG tube was injected with 50 cc of Omnipaque 300. Contrast opacifies the gastric lumen, duodenal bulb, and duodenal sweep and extends into the proximal jejunal loops. Small duodenal diverticulum identified. No contrast extravasation. Atherosclerotic calcifications aorta. Bones demineralized. IMPRESSION: Replaced PEG tube is within the gastric lumen. Electronically Signed   By: Lavonia Dana M.D.   On: 08/13/2019 11:33   DG Chest Port 1 View  Result Date: 09/08/2019 CLINICAL DATA:  79 year old male with cough. EXAM: PORTABLE CHEST 1 VIEW COMPARISON:  Chest radiograph dated 08/16/2019. FINDINGS: There is mild eventration of the right hemidiaphragm. There are bibasilar streaky densities, likely atelectasis. Infiltrate is less likely but not excluded clinical correlation is recommended. No lobar consolidation, pleural effusion, or pneumothorax. Stable mild cardiomegaly. Atherosclerotic calcification of the aortic arch. No acute osseous pathology. Amorphous calcification in the right upper quadrant likely represent contrast coated colonic diverticula. IMPRESSION: Bibasilar atelectasis versus less likely infiltrate. Electronically Signed   By: Anner Crete M.D.   On: 09/08/2019 22:39   DG CHEST PORT 1 VIEW  Result Date: 08/16/2019 CLINICAL DATA:  Acute  ischemic stroke.  Chest pain. EXAM: PORTABLE CHEST 1 VIEW COMPARISON:  08/04/2019 FINDINGS: Grossly unchanged enlarged cardiac silhouette and mediastinal contours with atherosclerotic plaque within thoracic aorta. There is persistent thickening of the right paratracheal stripe presumably secondary prominent vasculature. There is persistent mild elevation/eventration of the right hemidiaphragm with associated right infrahilar and basilar heterogeneous opacities. No definite pleural effusion however the left costophrenic angle is excluded from view. No new focal airspace opacities. No pneumothorax. No evidence of edema. No acute osseous abnormalities. IMPRESSION: 1. Cardiomegaly without superimposed acute cardiopulmonary disease. 2. Chronic elevation of the right hemidiaphragm with associated right basilar atelectasis. Electronically Signed   By: Sandi Mariscal M.D.   On: 08/16/2019 08:43   DG Swallowing Func-Speech Pathology  Result Date: 09/05/2019 Objective Swallowing Evaluation: Type of Study: MBS-Modified Barium Swallow Study  Patient Details Name: Lamari Beckles MRN: [id number] Date of Birth: [birth year] Today's Date: 09/05/2019 Time: SLP Start Time (ACUTE ONLY): 1520 -SLP Stop Time (ACUTE ONLY): 4193 SLP Time Calculation (min) (ACUTE ONLY): 15 min Past Medical  History: Past Medical History: Diagnosis Date . Acid reflux  . CHF (congestive heart failure) ([place])  . Coronary artery disease  . Dilated cardiomyopathy ([place])  . Folliculitis  . Gastric ulcer  . Hiatal hernia  . Hyperlipidemia   a. pt is adamantly against statins. . Hypertension  . Ischemia   a. Pt states he was diagnosed with "ischemia" in the 1990s but does not know further details, denies hx of heart blockage. . Nummular dermatitis  . Prostate cancer ([place])  . Renal disorder  . Scoliosis  . Sinus bradycardia  . Stomach ulcer   a. remote ulcer in the 1990s, no hx of bleeding. Past Surgical History: Past Surgical History: Procedure Laterality Date .  ESOPHAGOGASTRODUODENOSCOPY (EGD) WITH PROPOFOL N/A 08/04/2019  Procedure: ESOPHAGOGASTRODUODENOSCOPY (EGD) WITH PROPOFOL;  Surgeon: Jesusita Oka, MD;  Location: [facility];  Service: General;  Laterality: N/A; . INGUINAL HERNIA REPAIR Left  . IR ANGIO INTRA EXTRACRAN SEL COM CAROTID INNOMINATE UNI L MOD SED  07/10/2019 . IR ANGIO VERTEBRAL SEL SUBCLAVIAN INNOMINATE UNI R MOD SED  07/10/2019 . IR CT HEAD LTD  07/10/2019 . IR INTRAVSC STENT CERV CAROTID W/O EMB-PROT MOD SED INC ANGIO  07/10/2019 . IR PERCUTANEOUS ART THROMBECTOMY/INFUSION INTRACRANIAL INC DIAG ANGIO  07/10/2019 . IR REPLC GASTRO/COLONIC TUBE PERCUT W/FLUORO  08/13/2019 . PEG PLACEMENT N/A 08/04/2019  Procedure: PERCUTANEOUS ENDOSCOPIC GASTROSTOMY (PEG) PLACEMENT;  Surgeon: Jesusita Oka, MD;  Location: [facility];  Service: General;  Laterality: N/A; . RADIOLOGY WITH ANESTHESIA N/A 07/09/2019  Procedure: IR WITH ANESTHESIA;  Surgeon: Luanne Bras, MD;  Location: [facility];  Service: Radiology;  Laterality: N/A; HPI: 79 y.o. male with PMH of Afib, systolic CHF, HLD, CAD, HTN, GERD, prostate ca, and recent right MCA infarct 0/9/60, complicated by [facility] two days later, admitted to [facility] from rehab facility for rectal bleeding. Underwent initial clinical swallow evaluation 07/10/19, had a cortak, demonstrated multiple refusals to participate, refused attempts at [facility], and was followed by SLP until 6/7, at which time he was D/Cd from our service due to poor participation and progress.  PEG 6/1 and 6/10. Pt was on a dysphagia 1 diet with honey-thick liquids at time of D/C, but oral intake was minimal. Per notes, he has been tolerating solids in addition to g-tube feedings. GI is following; TF resumed; SLP ordered to assess if he can swallow POs.  Subjective: alert, agitated, confused Assessment / Plan / Recommendation CHL IP CLINICAL IMPRESSIONS 09/05/2019 Clinical Impression Patient presents with a severe oropharyngeal dysphagia characterized by decreased oral  manipulation and transit of puree solids and nectar thick liquids, swallow initiation delays to vallecular sinus with nectar thick liquids and puree solid and to pyriform with thin liquids. Patient exhibited moderate amount of aspiration prior to and during swallow of cup sip of thin liquids which he did not react to until already passing through vocal cords. Patient then started to cough, gag and regurgitate violently and saying he needed oxygen. After settling down, he did accept a couple sips of nectar thick liquids which did not result in aspiration or penetration but did cause mild vallecular and pyriform sinus residuals. Patient exhibited mild-moderate amount of vallecular sinus residuals with puree solids but this did clear mostly with subsequent swallows. Patient is at a high risk of aspiration and lack of hydration/nutrition. SLP Visit Diagnosis Dysphagia, oropharyngeal phase (R13.12) Attention and concentration deficit following -- Frontal lobe and executive function deficit following -- Impact on safety and function Severe aspiration risk  CHL IP TREATMENT RECOMMENDATION 09/05/2019 Treatment Recommendations Therapy as outlined in treatment plan below   Prognosis 09/05/2019 Prognosis for Safe Diet Advancement Fair Barriers to Reach Goals Cognitive deficits;Behavior;Severity of deficits Barriers/Prognosis Comment patient with agitation and poor compliance/participation CHL IP DIET RECOMMENDATION 09/05/2019 SLP Diet Recommendations Nectar thick liquid Liquid Administration via Cup Medication Administration Other (Comment) Compensations Slow rate;Small sips/bites;Minimize environmental distractions Postural Changes Remain semi-upright after after feeds/meals (Comment);Seated upright at 90 degrees   CHL IP OTHER RECOMMENDATIONS 09/05/2019 Recommended Consults -- Oral Care Recommendations Oral care BID;Staff/trained caregiver to provide oral care Other Recommendations Order thickener from pharmacy;Prohibited food  (jello, ice cream, thin soups);Remove water pitcher;Have oral suction available;Clarify dietary restrictions   CHL IP FOLLOW UP RECOMMENDATIONS 09/05/2019 Follow up Recommendations Skilled Nursing facility   [facility] IP FREQUENCY AND DURATION 09/05/2019 Speech Therapy Frequency (ACUTE ONLY) min 2x/week Treatment Duration 2 weeks      CHL IP ORAL PHASE 09/05/2019 Oral Phase Impaired Oral - Pudding Teaspoon -- Oral - Pudding Cup -- Oral - Honey Teaspoon -- Oral - Honey Cup -- Oral - Nectar Teaspoon -- Oral - Nectar Cup Weak lingual manipulation;Incomplete tongue to palate contact;Reduced posterior propulsion;Holding of bolus Oral - Nectar Straw -- Oral - Thin Teaspoon -- Oral - Thin Cup Incomplete tongue to palate contact;Weak lingual manipulation;Delayed oral transit Oral - Thin Straw -- Oral - Puree Reduced posterior propulsion;Incomplete tongue to palate contact;Weak lingual manipulation;Delayed oral transit;Decreased bolus cohesion;Piecemeal swallowing Oral - Mech Soft -- Oral - Regular -- Oral - Multi-Consistency -- Oral - Pill -- Oral Phase - Comment --  CHL IP PHARYNGEAL PHASE 09/05/2019 Pharyngeal Phase Impaired Pharyngeal- Pudding Teaspoon -- Pharyngeal -- Pharyngeal- Pudding Cup -- Pharyngeal -- Pharyngeal- Honey Teaspoon -- Pharyngeal -- Pharyngeal- Honey Cup -- Pharyngeal -- Pharyngeal- Nectar Teaspoon -- Pharyngeal -- Pharyngeal- Nectar Cup Delayed swallow initiation-vallecula;Pharyngeal residue - valleculae;Pharyngeal residue - pyriform;Pharyngeal residue - posterior pharnyx Pharyngeal -- Pharyngeal- Nectar Straw -- Pharyngeal -- Pharyngeal- Thin Teaspoon -- Pharyngeal -- Pharyngeal- Thin Cup Delayed swallow initiation-vallecula;Delayed swallow initiation-pyriform sinuses;Reduced airway/laryngeal closure;Penetration/Aspiration before swallow;Penetration/Aspiration during swallow;Moderate aspiration;Pharyngeal residue - valleculae Pharyngeal Material enters airway, passes BELOW cords and not ejected out despite cough  attempt by patient;Material enters airway, passes BELOW cords without attempt by patient to eject out (silent aspiration) Pharyngeal- Thin Straw -- Pharyngeal -- Pharyngeal- Puree Delayed swallow initiation-vallecula;Pharyngeal residue - valleculae;Pharyngeal residue - pyriform Pharyngeal -- Pharyngeal- Mechanical Soft -- Pharyngeal -- Pharyngeal- Regular -- Pharyngeal -- Pharyngeal- Multi-consistency -- Pharyngeal -- Pharyngeal- Pill -- Pharyngeal -- Pharyngeal Comment --  CHL IP CERVICAL ESOPHAGEAL PHASE 09/05/2019 Cervical Esophageal Phase WFL Pudding Teaspoon -- Pudding Cup -- Honey Teaspoon -- Honey Cup -- Nectar Teaspoon -- Nectar Cup -- Nectar Straw -- Thin Teaspoon -- Thin Cup -- Thin Straw -- Puree -- Mechanical Soft -- Regular -- Multi-consistency -- Pill -- Cervical Esophageal Comment -- Sonia Baller, MA, CCC-SLP Speech Therapy [facility]                Discharge Exam: BP 129/71 (BP Location: Left Arm)   Pulse 65   Temp 98 F (36.7 C)   Resp 19   Ht 6\' 2"  (1.88 m)   Wt 70.8 kg   SpO2 99%   BMI 20.04 kg/m  General appearance: alert, cooperative and no distress Head: Normocephalic, without obvious abnormality Eyes: EOMI Lungs: clear to auscultation bilaterally Heart: regular rate and rhythm and S1, S2 normal Abdomen: PEG in place; NT, ND, BS present Extremities: no edema Skin: mobility  and turgor normal Neurologic: Grossly normal   The results of significant diagnostics from this hospitalization (including imaging, microbiology, ancillary and laboratory) are listed below for reference.     Microbiology: Recent Results (from the past 240 hour(s))  SARS Coronavirus 2 by RT PCR (hospital order, performed in [facility] hospital lab) Nasopharyngeal Nasopharyngeal Swab     Status: None   Collection Time: 09/02/19  1:21 AM   Specimen: Nasopharyngeal Swab  Result Value Ref Range Status   SARS Coronavirus 2 NEGATIVE NEGATIVE Final    Comment: (NOTE) SARS-CoV-2 target nucleic  acids are NOT DETECTED.  The SARS-CoV-2 RNA is generally detectable in upper and lower respiratory specimens during the acute phase of infection. The lowest concentration of SARS-CoV-2 viral copies this assay can detect is 250 copies / mL. A negative result does not preclude SARS-CoV-2 infection and should not be used as the sole basis for treatment or other patient management decisions.  A negative result may occur with improper specimen collection / handling, submission of specimen other than nasopharyngeal swab, presence of viral mutation(s) within the areas targeted by this assay, and inadequate number of viral copies (<250 copies / mL). A negative result must be combined with clinical observations, patient history, and epidemiological information.  Fact Sheet for Patients:   StrictlyIdeas.no  Fact Sheet for Healthcare Providers: BankingDealers.co.za  This test is not yet approved or  cleared by the [place] FDA and has been authorized for detection and/or diagnosis of SARS-CoV-2 by FDA under an Emergency Use Authorization (EUA).  This EUA will remain in effect (meaning this test can be used) for the duration of the COVID-19 declaration under Section 564(b)(1) of the Act, 21 U.S.C. section 360bbb-3(b)(1), unless the authorization is terminated or revoked sooner.  Performed at [facility], [place] [address] [place], [place] [address]   SARS CORONAVIRUS 2 (TAT 6-24 HRS) Nasopharyngeal Nasopharyngeal Swab     Status: None   Collection Time: 09/08/19  8:46 AM   Specimen: Nasopharyngeal Swab  Result Value Ref Range Status   SARS Coronavirus 2 NEGATIVE NEGATIVE Final    Comment: (NOTE) SARS-CoV-2 target nucleic acids are NOT DETECTED.  The SARS-CoV-2 RNA is generally detectable in upper and lower respiratory specimens during the acute phase of infection. Negative results do not preclude SARS-CoV-2 infection,  do not rule out co-infections with other pathogens, and should not be used as the sole basis for treatment or other patient management decisions. Negative results must be combined with clinical observations, patient history, and epidemiological information. The expected result is Negative.  Fact Sheet for Patients: SugarRoll.be  Fact Sheet for Healthcare Providers: https://www.woods-mathews.com/  This test is not yet approved or cleared by the [place] FDA and  has been authorized for detection and/or diagnosis of SARS-CoV-2 by FDA under an Emergency Use Authorization (EUA). This EUA will remain  in effect (meaning this test can be used) for the duration of the COVID-19 declaration under Se ction 564(b)(1) of the Act, 21 U.S.C. section 360bbb-3(b)(1), unless the authorization is terminated or revoked sooner.  Performed at [facility], [place] [address], [place], [place] [address]   SARS Coronavirus 2 by RT PCR (hospital order, performed in [facility] hospital lab) Nasopharyngeal Nasopharyngeal Swab     Status: None   Collection Time: 09/08/19 12:08 PM   Specimen: Nasopharyngeal Swab  Result Value Ref Range Status   SARS Coronavirus 2 NEGATIVE NEGATIVE Final    Comment: (NOTE) SARS-CoV-2 target nucleic acids  are NOT DETECTED.  The SARS-CoV-2 RNA is generally detectable in upper and lower respiratory specimens during the acute phase of infection. The lowest concentration of SARS-CoV-2 viral copies this assay can detect is 250 copies / mL. A negative result does not preclude SARS-CoV-2 infection and should not be used as the sole basis for treatment or other patient management decisions.  A negative result may occur with improper specimen collection / handling, submission of specimen other than nasopharyngeal swab, presence of viral mutation(s) within the areas targeted by this assay, and inadequate number of viral copies (<250  copies / mL). A negative result must be combined with clinical observations, patient history, and epidemiological information.  Fact Sheet for Patients:   StrictlyIdeas.no  Fact Sheet for Healthcare Providers: BankingDealers.co.za  This test is not yet approved or  cleared by the [place] FDA and has been authorized for detection and/or diagnosis of SARS-CoV-2 by FDA under an Emergency Use Authorization (EUA).  This EUA will remain in effect (meaning this test can be used) for the duration of the COVID-19 declaration under Section 564(b)(1) of the Act, 21 U.S.C. section 360bbb-3(b)(1), unless the authorization is terminated or revoked sooner.  Performed at [facility], [place] [address] [place], [place] [address]   MRSA PCR Screening     Status: Abnormal   Collection Time: 09/08/19 11:28 PM   Specimen: Nasopharyngeal  Result Value Ref Range Status   MRSA by PCR POSITIVE (A) NEGATIVE Final    Comment:        The GeneXpert MRSA Assay (FDA approved for NASAL specimens only), is one component of a comprehensive MRSA colonization surveillance program. It is not intended to diagnose MRSA infection nor to guide or monitor treatment for MRSA infections. RESULT CALLED TO, READ BACK BY AND VERIFIED WITH: GUNDLACH,D @ 0846 ON [id number] BY POTEAT,S Performed at [facility] [address], [place], [place] [address]      Labs: BNP (last 3 results) Recent Labs    09/08/19 2155  BNP 267.1*   Basic Metabolic Panel: Recent Labs  Lab 09/06/19 0914 09/07/19 0429 09/08/19 0331 09/08/19 2155 09/09/19 0943  NA 141 137 139 139 139  K 4.1 3.6 3.9 4.2 3.8  CL 106 102 104 100 105  CO2 24 27 24 27 26   GLUCOSE 98 156* 158* 139* 113*  BUN 18 21 25* 32* 31*  CREATININE 1.17 1.12 1.03 1.17 1.06  CALCIUM 8.9 8.9 8.7* 8.9 8.7*  MG 1.8  --   --   --   --    Liver Function Tests: No results for input(s): AST,  ALT, ALKPHOS, BILITOT, PROT, ALBUMIN in the last 168 hours. No results for input(s): LIPASE, AMYLASE in the last 168 hours. No results for input(s): AMMONIA in the last 168 hours. CBC: Recent Labs  Lab 09/06/19 0416 09/07/19 0429 09/08/19 0331 09/08/19 2155 09/09/19 0943  WBC 6.4 9.7 9.0 12.6* 9.2  NEUTROABS  --   --   --  9.4* 5.6  HGB 10.0* 10.9* 9.8* 10.9* 9.7*  HCT 32.0* 35.4* 31.4* 35.4* 31.6*  MCV 96.7 96.7 96.9 97.3 96.0  PLT 302 315 255 285 256   Cardiac Enzymes: No results for input(s): CKTOTAL, CKMB, CKMBINDEX, TROPONINI in the last 168 hours. BNP: Invalid input(s): POCBNP CBG: Recent Labs  Lab 09/07/19 1145 09/07/19 1735 09/08/19 0016 09/08/19 0556 09/08/19 1153  GLUCAP 157* 130* 157* 128* 148*   D-Dimer No results for input(s): DDIMER in the last  72 hours. Hgb A1c No results for input(s): HGBA1C in the last 72 hours. Lipid Profile No results for input(s): CHOL, HDL, LDLCALC, TRIG, CHOLHDL, LDLDIRECT in the last 72 hours. Thyroid function studies No results for input(s): TSH, T4TOTAL, T3FREE, THYROIDAB in the last 72 hours.  Invalid input(s): FREET3 Anemia work up No results for input(s): VITAMINB12, FOLATE, FERRITIN, TIBC, IRON, RETICCTPCT in the last 72 hours. Urinalysis    Component Value Date/Time   COLORURINE YELLOW 08/23/2019 1306   APPEARANCEUR HAZY (A) 08/23/2019 1306   LABSPEC 1.011 08/23/2019 1306   PHURINE 8.0 08/23/2019 1306   GLUCOSEU NEGATIVE 08/23/2019 1306   HGBUR NEGATIVE 08/23/2019 1306   BILIRUBINUR NEGATIVE 08/23/2019 1306   BILIRUBINUR negative 06/12/2016 1146   KETONESUR NEGATIVE 08/23/2019 1306   PROTEINUR NEGATIVE 08/23/2019 1306   UROBILINOGEN 1.0 06/12/2016 1146   NITRITE NEGATIVE 08/23/2019 1306   LEUKOCYTESUR NEGATIVE 08/23/2019 1306   Sepsis Labs Invalid input(s): PROCALCITONIN,  WBC,  LACTICIDVEN Microbiology Recent Results (from the past 240 hour(s))  SARS Coronavirus 2 by RT PCR (hospital order, performed in  [facility] hospital lab) Nasopharyngeal Nasopharyngeal Swab     Status: None   Collection Time: 09/02/19  1:21 AM   Specimen: Nasopharyngeal Swab  Result Value Ref Range Status   SARS Coronavirus 2 NEGATIVE NEGATIVE Final    Comment: (NOTE) SARS-CoV-2 target nucleic acids are NOT DETECTED.  The SARS-CoV-2 RNA is generally detectable in upper and lower respiratory specimens during the acute phase of infection. The lowest concentration of SARS-CoV-2 viral copies this assay can detect is 250 copies / mL. A negative result does not preclude SARS-CoV-2 infection and should not be used as the sole basis for treatment or other patient management decisions.  A negative result may occur with improper specimen collection / handling, submission of specimen other than nasopharyngeal swab, presence of viral mutation(s) within the areas targeted by this assay, and inadequate number of viral copies (<250 copies / mL). A negative result must be combined with clinical observations, patient history, and epidemiological information.  Fact Sheet for Patients:   StrictlyIdeas.no  Fact Sheet for Healthcare Providers: BankingDealers.co.za  This test is not yet approved or  cleared by the [place] FDA and has been authorized for detection and/or diagnosis of SARS-CoV-2 by FDA under an Emergency Use Authorization (EUA).  This EUA will remain in effect (meaning this test can be used) for the duration of the COVID-19 declaration under Section 564(b)(1) of the Act, 21 U.S.C. section 360bbb-3(b)(1), unless the authorization is terminated or revoked sooner.  Performed at [facility], [place] [address] [place], [place] [address]   SARS CORONAVIRUS 2 (TAT 6-24 HRS) Nasopharyngeal Nasopharyngeal Swab     Status: None   Collection Time: 09/08/19  8:46 AM   Specimen: Nasopharyngeal Swab  Result Value Ref Range Status   SARS Coronavirus 2  NEGATIVE NEGATIVE Final    Comment: (NOTE) SARS-CoV-2 target nucleic acids are NOT DETECTED.  The SARS-CoV-2 RNA is generally detectable in upper and lower respiratory specimens during the acute phase of infection. Negative results do not preclude SARS-CoV-2 infection, do not rule out co-infections with other pathogens, and should not be used as the sole basis for treatment or other patient management decisions. Negative results must be combined with clinical observations, patient history, and epidemiological information. The expected result is Negative.  Fact Sheet for Patients: SugarRoll.be  Fact Sheet for Healthcare Providers: https://www.woods-mathews.com/  This test is not yet approved or cleared by the  [place] Architectural technologist and  has been authorized for detection and/or diagnosis of SARS-CoV-2 by FDA under an Print production planner (EUA). This EUA will remain  in effect (meaning this test can be used) for the duration of the COVID-19 declaration under Se ction 564(b)(1) of the Act, 21 U.S.C. section 360bbb-3(b)(1), unless the authorization is terminated or revoked sooner.  Performed at [facility], [place] [address] [place], [place] [address]   SARS Coronavirus 2 by RT PCR (hospital order, performed in [facility] hospital lab) Nasopharyngeal Nasopharyngeal Swab     Status: None   Collection Time: 09/08/19 12:08 PM   Specimen: Nasopharyngeal Swab  Result Value Ref Range Status   SARS Coronavirus 2 NEGATIVE NEGATIVE Final    Comment: (NOTE) SARS-CoV-2 target nucleic acids are NOT DETECTED.  The SARS-CoV-2 RNA is generally detectable in upper and lower respiratory specimens during the acute phase of infection. The lowest concentration of SARS-CoV-2 viral copies this assay can detect is 250 copies / mL. A negative result does not preclude SARS-CoV-2 infection and should not be used as the sole basis for treatment or  other patient management decisions.  A negative result may occur with improper specimen collection / handling, submission of specimen other than nasopharyngeal swab, presence of viral mutation(s) within the areas targeted by this assay, and inadequate number of viral copies (<250 copies / mL). A negative result must be combined with clinical observations, patient history, and epidemiological information.  Fact Sheet for Patients:   StrictlyIdeas.no  Fact Sheet for Healthcare Providers: BankingDealers.co.za  This test is not yet approved or  cleared by the [place] FDA and has been authorized for detection and/or diagnosis of SARS-CoV-2 by FDA under an Emergency Use Authorization (EUA).  This EUA will remain in effect (meaning this test can be used) for the duration of the COVID-19 declaration under Section 564(b)(1) of the Act, 21 U.S.C. section 360bbb-3(b)(1), unless the authorization is terminated or revoked sooner.  Performed at [facility], [place] [address], [place], [place] [address]   MRSA PCR Screening     Status: Abnormal   Collection Time: 09/08/19 11:28 PM   Specimen: Nasopharyngeal  Result Value Ref Range Status   MRSA by PCR POSITIVE (A) NEGATIVE Final    Comment:        The GeneXpert MRSA Assay (FDA approved for NASAL specimens only), is one component of a comprehensive MRSA colonization surveillance program. It is not intended to diagnose MRSA infection nor to guide or monitor treatment for MRSA infections. RESULT CALLED TO, READ BACK BY AND VERIFIED WITH: GUNDLACH,D @ 0846 ON [id number] BY POTEAT,S Performed at [facility] [address], [place], [place] [address]      Time coordinating discharge: Over 72 minutes    Dwyane Dee, MD  [facility] 09/09/2019, 1:35 PM Pager: Secure chat  If 7PM-7AM, please contact night-coverage [email or web address] Password [email or web address]

## 2019-09-09 NOTE — Progress Notes (Signed)
Initial Nutrition Assessment  DOCUMENTATION CODES:   Not applicable  INTERVENTION:   -D/c Ensure Enlive po BID, each supplement provides 350 kcal and 20 grams of protein -Initiate Jevity 1.5 @ 50 ml/hr via PEG  30 ml Prostat BID.    220 ml free water flush every 4 hours  Tube feeding regimen provides 2000 kcal (93% of needs), 107 grams of protein, and 912 ml of H2O. Total free water: 2232 ml daily  NUTRITION DIAGNOSIS:   Increased nutrient needs related to chronic illness (prostate cancer, dysphagia) as evidenced by estimated needs.  GOAL:   Patient will meet greater than or equal to 90% of their needs  MONITOR:   PO intake, Diet advancement, Labs, Weight trends, Skin, I & O's  REASON FOR ASSESSMENT:   Malnutrition Screening Tool    ASSESSMENT:   Stephen Copeland is a 79 y.o. male with medical history significant of A. fib, cardiomyopathy, hypertension, prostate cancer, who returns for his third admission in the past few months.  Pt admitted with HCAP.   Reviewed I/O's: +100 ml x 24 hours and +100 ml since admission  Attempted to speak with pt via phone, however, no answer. RD unable to obtain further nutrition-related history or complete nutrition-focused physical exam at this time.   Pt consumes a PO diet, however, receives most of nutrition via PEG (placed on 08/04/19) secondary to dysphagia and recent stroke. Per review of records, TF regimen PTA is Jevity 1.5 @ 50 ml/hr with 30 ml Prostat BID, which provides 2000 kcals and 107 grams protein, meeting 174 of estimated kcal needs and 97% of estimated protein needs.   Reviewed wt hx; pt has experienced a 8.2% wt loss over the past month, which is significant for time frame. Pt is at high risk for malnutrition, however, RD unable to identify at this time.   Per discussion with MD, plan to d/c to SNF today. Reviewed permission to resume PTA TF orders. MD plans to d/c on same regimen.   Labs reviewed.  Diet Order:   Diet  Order            Diet Heart Room service appropriate? Yes; Fluid consistency: Nectar Thick  Diet effective now                 EDUCATION NEEDS:   No education needs have been identified at this time  Skin:  Skin Assessment: Skin Integrity Issues: Skin Integrity Issues:: Other (Comment) Incisions: - Other: MASD rt and lt groin  Last BM:  09/09/19  Height:   Ht Readings from Last 1 Encounters:  09/09/19 6\' 2"  (1.88 m)    Weight:   Wt Readings from Last 1 Encounters:  09/09/19 70.8 kg    Ideal Body Weight:  86.3 kg  BMI:  Body mass index is 20.04 kg/m.  Estimated Nutritional Needs:   Kcal:  2150-2350  Protein:  110-125 grams  Fluid:  > 2.2 L    Loistine Chance, RD, LDN, [place] Registered Dietitian II Certified Diabetes Care and Education Specialist Please refer to [facility] for RD and/or RD on-call/weekend/after hours pager

## 2019-09-10 DIAGNOSIS — J690 Pneumonitis due to inhalation of food and vomit: Secondary | ICD-10-CM | POA: Diagnosis not present

## 2019-09-10 DIAGNOSIS — F411 Generalized anxiety disorder: Secondary | ICD-10-CM | POA: Diagnosis not present

## 2019-09-10 DIAGNOSIS — M79631 Pain in right forearm: Secondary | ICD-10-CM | POA: Diagnosis not present

## 2019-09-10 DIAGNOSIS — R0989 Other specified symptoms and signs involving the circulatory and respiratory systems: Secondary | ICD-10-CM | POA: Diagnosis not present

## 2019-09-10 DIAGNOSIS — R05 Cough: Secondary | ICD-10-CM | POA: Diagnosis not present

## 2019-09-10 DIAGNOSIS — I6930 Unspecified sequelae of cerebral infarction: Secondary | ICD-10-CM | POA: Diagnosis not present

## 2019-09-10 DIAGNOSIS — I69391 Dysphagia following cerebral infarction: Secondary | ICD-10-CM | POA: Diagnosis not present

## 2019-09-14 LAB — CULTURE, BLOOD (ROUTINE X 2)
Culture: NO GROWTH
Culture: NO GROWTH
Special Requests: ADEQUATE
Special Requests: ADEQUATE

## 2019-09-17 DIAGNOSIS — Z9119 Patient's noncompliance with other medical treatment and regimen: Secondary | ICD-10-CM | POA: Diagnosis not present

## 2019-09-17 DIAGNOSIS — M25561 Pain in right knee: Secondary | ICD-10-CM | POA: Diagnosis not present

## 2019-09-17 DIAGNOSIS — I6930 Unspecified sequelae of cerebral infarction: Secondary | ICD-10-CM | POA: Diagnosis not present

## 2019-09-17 DIAGNOSIS — F411 Generalized anxiety disorder: Secondary | ICD-10-CM | POA: Diagnosis not present

## 2019-09-17 DIAGNOSIS — I69391 Dysphagia following cerebral infarction: Secondary | ICD-10-CM | POA: Diagnosis not present

## 2019-09-17 DIAGNOSIS — M25562 Pain in left knee: Secondary | ICD-10-CM | POA: Diagnosis not present

## 2019-09-21 DIAGNOSIS — I69391 Dysphagia following cerebral infarction: Secondary | ICD-10-CM | POA: Diagnosis not present

## 2019-09-21 DIAGNOSIS — R197 Diarrhea, unspecified: Secondary | ICD-10-CM | POA: Diagnosis not present

## 2019-09-21 DIAGNOSIS — F411 Generalized anxiety disorder: Secondary | ICD-10-CM | POA: Diagnosis not present

## 2019-09-21 DIAGNOSIS — I6930 Unspecified sequelae of cerebral infarction: Secondary | ICD-10-CM | POA: Diagnosis not present

## 2019-09-21 DIAGNOSIS — Z9119 Patient's noncompliance with other medical treatment and regimen: Secondary | ICD-10-CM | POA: Diagnosis not present

## 2019-09-21 DIAGNOSIS — A0472 Enterocolitis due to Clostridium difficile, not specified as recurrent: Secondary | ICD-10-CM | POA: Diagnosis not present

## 2019-09-21 DIAGNOSIS — E860 Dehydration: Secondary | ICD-10-CM | POA: Diagnosis not present

## 2019-09-28 ENCOUNTER — Telehealth (HOSPITAL_COMMUNITY): Payer: Self-pay

## 2019-09-28 ENCOUNTER — Ambulatory Visit (HOSPITAL_COMMUNITY): Payer: Medicare Other

## 2019-09-28 DIAGNOSIS — F411 Generalized anxiety disorder: Secondary | ICD-10-CM | POA: Diagnosis not present

## 2019-09-28 DIAGNOSIS — E860 Dehydration: Secondary | ICD-10-CM | POA: Diagnosis not present

## 2019-09-28 DIAGNOSIS — D649 Anemia, unspecified: Secondary | ICD-10-CM | POA: Diagnosis not present

## 2019-09-28 DIAGNOSIS — K921 Melena: Secondary | ICD-10-CM | POA: Diagnosis not present

## 2019-09-28 DIAGNOSIS — A0472 Enterocolitis due to Clostridium difficile, not specified as recurrent: Secondary | ICD-10-CM | POA: Diagnosis not present

## 2019-09-28 DIAGNOSIS — J690 Pneumonitis due to inhalation of food and vomit: Secondary | ICD-10-CM | POA: Diagnosis not present

## 2019-09-28 DIAGNOSIS — R05 Cough: Secondary | ICD-10-CM | POA: Diagnosis not present

## 2019-09-28 DIAGNOSIS — R451 Restlessness and agitation: Secondary | ICD-10-CM | POA: Diagnosis not present

## 2019-09-28 NOTE — Telephone Encounter (Signed)
Called [facility] to reschedule consult. Tammy not available. Nurse cancelled and will have Tammy return call the schedule. AW

## 2019-09-30 DIAGNOSIS — K921 Melena: Secondary | ICD-10-CM | POA: Diagnosis not present

## 2019-09-30 DIAGNOSIS — F411 Generalized anxiety disorder: Secondary | ICD-10-CM | POA: Diagnosis not present

## 2019-09-30 DIAGNOSIS — R1312 Dysphagia, oropharyngeal phase: Secondary | ICD-10-CM | POA: Diagnosis not present

## 2019-09-30 DIAGNOSIS — A0472 Enterocolitis due to Clostridium difficile, not specified as recurrent: Secondary | ICD-10-CM | POA: Diagnosis not present

## 2019-09-30 DIAGNOSIS — Z7901 Long term (current) use of anticoagulants: Secondary | ICD-10-CM | POA: Diagnosis not present

## 2019-09-30 DIAGNOSIS — D649 Anemia, unspecified: Secondary | ICD-10-CM | POA: Diagnosis not present

## 2019-09-30 DIAGNOSIS — R451 Restlessness and agitation: Secondary | ICD-10-CM | POA: Diagnosis not present

## 2019-10-02 DIAGNOSIS — F411 Generalized anxiety disorder: Secondary | ICD-10-CM | POA: Diagnosis not present

## 2019-10-02 DIAGNOSIS — R451 Restlessness and agitation: Secondary | ICD-10-CM | POA: Diagnosis not present

## 2019-10-02 DIAGNOSIS — I69391 Dysphagia following cerebral infarction: Secondary | ICD-10-CM | POA: Diagnosis not present

## 2019-10-02 DIAGNOSIS — A0472 Enterocolitis due to Clostridium difficile, not specified as recurrent: Secondary | ICD-10-CM | POA: Diagnosis not present

## 2019-10-05 DIAGNOSIS — B372 Candidiasis of skin and nail: Secondary | ICD-10-CM | POA: Diagnosis not present

## 2019-10-05 DIAGNOSIS — R451 Restlessness and agitation: Secondary | ICD-10-CM | POA: Diagnosis not present

## 2019-10-05 DIAGNOSIS — F411 Generalized anxiety disorder: Secondary | ICD-10-CM | POA: Diagnosis not present

## 2019-10-05 DIAGNOSIS — L22 Diaper dermatitis: Secondary | ICD-10-CM | POA: Diagnosis not present

## 2019-10-05 DIAGNOSIS — R1312 Dysphagia, oropharyngeal phase: Secondary | ICD-10-CM | POA: Diagnosis not present

## 2019-10-05 DIAGNOSIS — Z9119 Patient's noncompliance with other medical treatment and regimen: Secondary | ICD-10-CM | POA: Diagnosis not present

## 2019-10-08 ENCOUNTER — Inpatient Hospital Stay: Payer: Medicare Other | Admitting: Adult Health

## 2019-10-08 DIAGNOSIS — Z9119 Patient's noncompliance with other medical treatment and regimen: Secondary | ICD-10-CM | POA: Diagnosis not present

## 2019-10-08 DIAGNOSIS — F411 Generalized anxiety disorder: Secondary | ICD-10-CM | POA: Diagnosis not present

## 2019-10-08 DIAGNOSIS — R451 Restlessness and agitation: Secondary | ICD-10-CM | POA: Diagnosis not present

## 2019-10-08 DIAGNOSIS — B372 Candidiasis of skin and nail: Secondary | ICD-10-CM | POA: Diagnosis not present

## 2019-10-08 DIAGNOSIS — F331 Major depressive disorder, recurrent, moderate: Secondary | ICD-10-CM | POA: Diagnosis not present

## 2019-10-15 ENCOUNTER — Telehealth: Payer: Self-pay | Admitting: Cardiovascular Disease

## 2019-10-15 NOTE — Telephone Encounter (Signed)
New Message:    Pt called and wanted Dr [name] to know he have been in the hospital with Pneumonia and some other things and now he is in the [facility]. He said they will be contacting Dr [name].

## 2019-10-21 ENCOUNTER — Encounter (HOSPITAL_COMMUNITY): Payer: Self-pay

## 2019-10-21 ENCOUNTER — Other Ambulatory Visit: Payer: Self-pay

## 2019-10-21 ENCOUNTER — Emergency Department (HOSPITAL_COMMUNITY)
Admission: EM | Admit: 2019-10-21 | Discharge: 2019-10-21 | Disposition: A | Discharge status: Home / Self Care | Payer: Medicare Other | Attending: Emergency Medicine | Admitting: Emergency Medicine

## 2019-10-21 DIAGNOSIS — I5021 Acute systolic (congestive) heart failure: Secondary | ICD-10-CM | POA: Diagnosis not present

## 2019-10-21 DIAGNOSIS — R0600 Dyspnea, unspecified: Secondary | ICD-10-CM | POA: Diagnosis not present

## 2019-10-21 DIAGNOSIS — R454 Irritability and anger: Secondary | ICD-10-CM | POA: Diagnosis not present

## 2019-10-21 DIAGNOSIS — Z7901 Long term (current) use of anticoagulants: Secondary | ICD-10-CM | POA: Insufficient documentation

## 2019-10-21 DIAGNOSIS — I2510 Atherosclerotic heart disease of native coronary artery without angina pectoris: Secondary | ICD-10-CM | POA: Insufficient documentation

## 2019-10-21 DIAGNOSIS — N183 Chronic kidney disease, stage 3 unspecified: Secondary | ICD-10-CM | POA: Insufficient documentation

## 2019-10-21 DIAGNOSIS — F0390 Unspecified dementia without behavioral disturbance: Secondary | ICD-10-CM | POA: Diagnosis not present

## 2019-10-21 DIAGNOSIS — Z79899 Other long term (current) drug therapy: Secondary | ICD-10-CM | POA: Insufficient documentation

## 2019-10-21 DIAGNOSIS — F411 Generalized anxiety disorder: Secondary | ICD-10-CM | POA: Diagnosis not present

## 2019-10-21 DIAGNOSIS — R0989 Other specified symptoms and signs involving the circulatory and respiratory systems: Secondary | ICD-10-CM | POA: Diagnosis not present

## 2019-10-21 DIAGNOSIS — Z9119 Patient's noncompliance with other medical treatment and regimen: Secondary | ICD-10-CM | POA: Diagnosis not present

## 2019-10-21 DIAGNOSIS — I130 Hypertensive heart and chronic kidney disease with heart failure and stage 1 through stage 4 chronic kidney disease, or unspecified chronic kidney disease: Secondary | ICD-10-CM | POA: Diagnosis not present

## 2019-10-21 DIAGNOSIS — K625 Hemorrhage of anus and rectum: Secondary | ICD-10-CM | POA: Diagnosis not present

## 2019-10-21 DIAGNOSIS — R451 Restlessness and agitation: Secondary | ICD-10-CM | POA: Diagnosis not present

## 2019-10-21 DIAGNOSIS — R195 Other fecal abnormalities: Secondary | ICD-10-CM | POA: Diagnosis not present

## 2019-10-21 DIAGNOSIS — Z8546 Personal history of malignant neoplasm of prostate: Secondary | ICD-10-CM | POA: Insufficient documentation

## 2019-10-21 DIAGNOSIS — R05 Cough: Secondary | ICD-10-CM | POA: Diagnosis not present

## 2019-10-21 DIAGNOSIS — I69391 Dysphagia following cerebral infarction: Secondary | ICD-10-CM | POA: Diagnosis not present

## 2019-10-21 LAB — CBC WITH DIFFERENTIAL/PLATELET
Abs Immature Granulocytes: 0.02 10*3/uL (ref 0.00–0.07)
Basophils Absolute: 0 10*3/uL (ref 0.0–0.1)
Basophils Relative: 1 %
Eosinophils Absolute: 0.1 10*3/uL (ref 0.0–0.5)
Eosinophils Relative: 2 %
HCT: 44.3 % (ref 39.0–52.0)
Hemoglobin: 13.5 g/dL (ref 13.0–17.0)
Immature Granulocytes: 0 %
Lymphocytes Relative: 27 %
Lymphs Abs: 2.2 10*3/uL (ref 0.7–4.0)
MCH: 27.8 pg (ref 26.0–34.0)
MCHC: 30.5 g/dL (ref 30.0–36.0)
MCV: 91.2 fL (ref 80.0–100.0)
Monocytes Absolute: 0.7 10*3/uL (ref 0.1–1.0)
Monocytes Relative: 8 %
Neutro Abs: 5.1 10*3/uL (ref 1.7–7.7)
Neutrophils Relative %: 62 %
Platelets: 346 10*3/uL (ref 150–400)
RBC: 4.86 MIL/uL (ref 4.22–5.81)
RDW: 16.8 % — ABNORMAL HIGH (ref 11.5–15.5)
WBC: 8.1 10*3/uL (ref 4.0–10.5)
nRBC: 0 % (ref 0.0–0.2)

## 2019-10-21 LAB — COMPREHENSIVE METABOLIC PANEL
ALT: 46 U/L — ABNORMAL HIGH (ref 0–44)
AST: 34 U/L (ref 15–41)
Albumin: 3.8 g/dL (ref 3.5–5.0)
Alkaline Phosphatase: 112 U/L (ref 38–126)
Anion gap: 10 (ref 5–15)
BUN: 47 mg/dL — ABNORMAL HIGH (ref 8–23)
CO2: 28 mmol/L (ref 22–32)
Calcium: 9.6 mg/dL (ref 8.9–10.3)
Chloride: 103 mmol/L (ref 98–111)
Creatinine, Ser: 1.19 mg/dL (ref 0.61–1.24)
GFR calc Af Amer: >60 mL/min (ref >60)
GFR calc non Af Amer: 58 mL/min — ABNORMAL LOW (ref >60)
Glucose, Bld: 103 mg/dL — ABNORMAL HIGH (ref 70–99)
Potassium: 4.7 mmol/L (ref 3.5–5.1)
Sodium: 141 mmol/L (ref 135–145)
Total Bilirubin: 0.4 mg/dL (ref 0.3–1.2)
Total Protein: 8.2 g/dL — ABNORMAL HIGH (ref 6.5–8.1)

## 2019-10-21 LAB — TYPE AND SCREEN
ABO/RH(D): B POS
Antibody Screen: NEGATIVE

## 2019-10-21 LAB — POC OCCULT BLOOD, ED: Fecal Occult Bld: POSITIVE — AB

## 2019-10-21 MED ORDER — PANTOPRAZOLE SODIUM 40 MG IV SOLR
40.0000 mg | Freq: Once | INTRAVENOUS | Status: AC
  Administered 2019-10-21: 40 mg via INTRAVENOUS
  Filled 2019-10-21: qty 40

## 2019-10-21 MED ORDER — PANTOPRAZOLE SODIUM 40 MG PO PACK: 40.0000 mg | PACK | Freq: Two times a day (BID) | ORAL | 0 refills | Status: DC

## 2019-10-21 MED ORDER — SODIUM CHLORIDE 0.9 % IV BOLUS
1000.0000 mL | Freq: Once | INTRAVENOUS | Status: AC
  Administered 2019-10-21: 1000 mL via INTRAVENOUS

## 2019-10-21 MED ORDER — LORAZEPAM 2 MG/ML IJ SOLN
1.0000 mg | Freq: Once | INTRAMUSCULAR | Status: AC
  Administered 2019-10-21: 1 mg via INTRAVENOUS
  Filled 2019-10-21: qty 1

## 2019-10-21 NOTE — ED Provider Notes (Signed)
[name] DEPT Provider Note   CSN: [id number] Arrival date & time: 10/21/19  1716     History Chief Complaint  Patient presents with  . Rectal Bleeding    Stephen Copeland is a 79 y.o. male hx of HL, HTN, CHF, A. fib not on anticoagulation, CAD with stents on Brilinta here presenting with possible blood in his stool.  Patient is from a nursing home and is demented.  Patient apparently has some blood in his stool per the nursing home.  However it is not clear how much blood there is.  Patient is demented and unable to give much history.  No reported vomiting.  Patient states that he feels fine.  He was on Eliquis at one point but had a GI bleed so was taken off Eliquis.  The history is provided by the patient.       Past Medical History:  Diagnosis Date  . Acid reflux   . CHF (congestive heart failure) ([facility])   . Coronary artery disease   . Dilated cardiomyopathy ([facility])   . Folliculitis   . Gastric ulcer   . Hiatal hernia   . Hyperlipidemia    a. pt is adamantly against statins.  . Hypertension   . Ischemia    a. Pt states he was diagnosed with "ischemia" in the 1990s but does not know further details, denies hx of heart blockage.  . Nummular dermatitis   . Prostate cancer ([facility])   . Renal disorder   . Scoliosis   . Sinus bradycardia   . Stomach ulcer    a. remote ulcer in the 1990s, no hx of bleeding.    Patient Active Problem List   Diagnosis Date Noted  . At high risk for aspiration 09/09/2019  . Atrial fibrillation ([facility]) 09/09/2019  . Hematochezia   . Lower GI bleed 09/02/2019  . SAH (subarachnoid hemorrhage) ([facility]) s/p clot retrieval and stent placement 08/06/2019  . Aspiration pneumonia ([facility]), likely 08/06/2019  . 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 L BBB (bundle branch block) 08/06/2019  . Prolonged Q-T interval on ECG 08/06/2019  . 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 Frequent loose stools  08/06/2019  . Acute embolic stroke ([facility])   . SBO (small bowel obstruction) ([facility] [place])   . History of prostate cancer   . Stage 3 chronic kidney disease   . Agitation   . Supplemental oxygen dependent   . 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 Acute hypoxemic respiratory failure ([facility])   . Acute ischemic stroke ([facility]) -R MCA d/t R ICA and M2 occlusion s/p clot retrieval and R ICA stent 07/09/2019  . Vasovagal syncope 07/18/2017  . S/P TURP 07/18/2017  . Coronary artery disease 07/07/2017  . 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 Encounter for therapeutic drug monitoring 04/09/2017  . Right lower lobe pulmonary nodule 12/19/2016  . Noncompliance with treatment plan 12/19/2016  . Acute renal failure (ARF) ([facility]) 12/18/2016  . Hyperkalemia 12/18/2016  . Hyperbilirubinemia 12/18/2016  . UTI (urinary tract infection) 05/31/2016  . Acute urinary retention 05/31/2016  . Acute kidney injury superimposed on chronic kidney disease ([facility]) 05/31/2016  . Generalized weakness 05/31/2016  . Falls 05/31/2016  . Acute systolic CHF (congestive heart failure) ([facility])   . Congestive dilated cardiomyopathy ([place])   . Uncontrolled hypertension   . Pulmonary hypertension ([facility])   . Chronic systolic CHF (congestive heart failure) ([place] [facility])   . Paroxysmal atrial  fibrillation ([place]) 12/19/2015  . Hypertension 12/19/2015  . Acute CHF ([facility]) 12/19/2015  . Hyperglycemia 12/19/2015  . Acid reflux   . Hyperlipidemia   . Sinus bradycardia   . Gastroesophageal reflux disease   . Other hyperlipidemia   . Elevated troponin     Past Surgical History:  Procedure Laterality Date  . BIOPSY  09/04/2019   Procedure: BIOPSY;  Surgeon: Ladene Artist, MD;  Location: [facility];  Service: Gastroenterology;;  . COLONOSCOPY WITH PROPOFOL N/A 09/04/2019   Procedure: COLONOSCOPY WITH PROPOFOL;  Surgeon: Ladene Artist, MD;  Location: [facility];  Service: Gastroenterology;  Laterality: N/A;  .  ESOPHAGOGASTRODUODENOSCOPY (EGD) WITH PROPOFOL N/A 08/04/2019   Procedure: ESOPHAGOGASTRODUODENOSCOPY (EGD) WITH PROPOFOL;  Surgeon: Jesusita Oka, MD;  Location: [facility];  Service: General;  Laterality: N/A;  . INGUINAL HERNIA REPAIR Left   . IR ANGIO INTRA EXTRACRAN SEL COM CAROTID INNOMINATE UNI L MOD SED  07/10/2019  . IR ANGIO VERTEBRAL SEL SUBCLAVIAN INNOMINATE UNI R MOD SED  07/10/2019  . IR CT HEAD LTD  07/10/2019  . IR INTRAVSC STENT CERV CAROTID W/O EMB-PROT MOD SED INC ANGIO  07/10/2019  . IR PERCUTANEOUS ART THROMBECTOMY/INFUSION INTRACRANIAL INC DIAG ANGIO  07/10/2019  . IR REPLC GASTRO/COLONIC TUBE PERCUT W/FLUORO  08/13/2019  . PEG PLACEMENT N/A 08/04/2019   Procedure: PERCUTANEOUS ENDOSCOPIC GASTROSTOMY (PEG) PLACEMENT;  Surgeon: Jesusita Oka, MD;  Location: [facility];  Service: General;  Laterality: N/A;  . RADIOLOGY WITH ANESTHESIA N/A 07/09/2019   Procedure: IR WITH ANESTHESIA;  Surgeon: Luanne Bras, MD;  Location: [facility];  Service: Radiology;  Laterality: N/A;       Family History  Problem Relation Age of Onset  . Heart disease Mother        Further details not reported, died at age 72  . Valvular heart disease Father        H/o MV surgery, diet at age 13    Social History   Tobacco Use  . Smoking status: Never Smoker  . Smokeless tobacco: Never Used  Vaping Use  . Vaping Use: Never used  Substance Use Topics  . Alcohol use: No  . Drug use: No    Home Medications Prior to Admission medications   Medication Sig Start Date End Date Taking? Authorizing Provider  acetaminophen (TYLENOL) 325 MG tablet Take 650 mg by mouth every 6 (six) hours as needed for mild pain.    [provider]  Amino Acids-Protein Hydrolys (FEEDING SUPPLEMENT, PRO-STAT SUGAR FREE 64,) LIQD Place 30 mLs into feeding tube 2 (two) times daily. 08/26/19   Donzetta Starch, NP  clopidogrel (PLAVIX) 75 MG tablet Take 75 mg by mouth in the morning and at bedtime.    [provider]  hydrALAZINE (APRESOLINE) 25 MG tablet Place 1 tablet (25 mg total) into feeding tube every 8 (eight) hours. 08/26/19   Donzetta Starch, NP  lidocaine (XYLOCAINE) 5 % ointment Apply 1 application topically 2 (two) times daily as needed (pain). 08/26/19   Donzetta Starch, NP  metoprolol tartrate (LOPRESSOR) 25 mg/10 mL SUSP Place 10 mLs (25 mg total) into feeding tube 2 (two) times daily. 08/26/19   Donzetta Starch, NP  Nutritional Supplements (FEEDING SUPPLEMENT, JEVITY 1.5 CAL/FIBER,) LIQD Place 1,000 mLs into feeding tube continuous. 08/26/19   Donzetta Starch, NP  Nystatin (GERHARDT'S BUTT CREAM) CREA Apply 1 application topically every 8 (eight) hours as needed for irritation. 08/26/19   Burnetta Sabin  L, NP  pantoprazole sodium (PROTONIX) 40 mg/20 mL PACK Place 20 mLs (40 mg total) into feeding tube daily. 08/26/19   Donzetta Starch, NP  sucralfate (CARAFATE) 1 GM/10ML suspension Place 20 mLs (2 g total) rectally 2 (two) times daily for 28 days. 09/08/19 10/06/19  Antonieta Pert, MD  Water For Irrigation, Sterile (FREE WATER) SOLN Place 200 mLs into feeding tube every 4 (four) hours. 08/26/19   Donzetta Starch, NP    Allergies    Patient has no known allergies.  Review of Systems   Review of Systems  Gastrointestinal: Positive for blood in stool and hematochezia.  All other systems reviewed and are negative.   Physical Exam Updated Vital Signs BP (!) 136/92 (BP Location: Right Arm)   Pulse 82   Temp 98.2 F (36.8 C) (Oral)   Resp 18   SpO2 97%   Physical Exam Vitals and nursing note reviewed.  Constitutional:      Comments: Demented   HENT:     Head: Normocephalic.     Nose: Nose normal.     Mouth/Throat:     Mouth: Mucous membranes are moist.  Eyes:     Extraocular Movements: Extraocular movements intact.     Pupils: Pupils are equal, round, and reactive to light.  Cardiovascular:     Rate and Rhythm: Normal rate and regular rhythm.     Pulses: Normal pulses.     Heart  sounds: Normal heart sounds.  Pulmonary:     Effort: Pulmonary effort is normal.     Breath sounds: Normal breath sounds.  Abdominal:     General: Abdomen is flat.     Palpations: Abdomen is soft.  Genitourinary:    Comments: Rectal- brown stool, no obvious hemorrhoids  Musculoskeletal:        General: Normal range of motion.     Cervical back: Normal range of motion and neck supple.  Skin:    General: Skin is warm.     Capillary Refill: Capillary refill takes less than 2 seconds.  Neurological:     General: No focal deficit present.     Mental Status: He is oriented to person, place, and time.  Psychiatric:        Mood and Affect: Mood normal.        Behavior: Behavior normal.     ED Results / Procedures / Treatments   Labs (all labs ordered are listed, but only abnormal results are displayed) Labs Reviewed  CBC WITH DIFFERENTIAL/PLATELET  COMPREHENSIVE METABOLIC PANEL  POC OCCULT BLOOD, ED  TYPE AND SCREEN    EKG None  Radiology No results found.  Procedures Procedures (including critical care time)  Medications Ordered in ED Medications  sodium chloride 0.9 % bolus 1,000 mL (has no administration in time range)    ED Course  I have reviewed the triage vital signs and the nursing notes.  Pertinent labs & imaging results that were available during my care of the patient were reviewed by me and considered in my medical decision making (see chart for details).    MDM Rules/Calculators/A&P                          Stephen Copeland is a 79 y.o. male you presenting with possible blood in his stool.  I do not visualize any blood right now.  Patient has no abdominal tenderness.  Will get CBC and CMP.  Patient is no longer on  blood thinners.  6:58 PM Labs show stable hemoglobin 13.5 he is guaiac is mildly positive but he has brown stool.  Patient is already on Protonix 40 daily and will increase to 80 daily.  He recently had endoscopy that did not show a bleeding  source and also a colonoscopy that did not show a source.  Will hold Plavix for 2 days.  I told patient that we will send him back at which point, he became very anxious.  Now he says that the real reason why he comes here is because he is very unhappy with [facility].  He does not want to go back and I told him that if he wants to go to a different facility, he needs to talk to the case manager there to have a formal transfer process.  At this point given that his hemoglobin is stable and the fact that he has multiple work-up for guaiac positive stools recently, I do not see reason for admission.  Will discharge back to facility.  Final Clinical Impression(s) / ED Diagnoses Final diagnoses:  None    Rx / DC Orders ED Discharge Orders    None       Drenda Freeze, MD 10/21/19 1900

## 2019-10-21 NOTE — ED Notes (Signed)
Spoke with Stephen Copeland from [facility] to give report regarding d/c instructions.

## 2019-10-21 NOTE — ED Notes (Signed)
[facility] called for transportation  

## 2019-10-21 NOTE — ED Triage Notes (Signed)
Pt arrives from [facility].  C/c blood in his stool. Facility reports not very much, but decided he needed to be seen here. First time seeing blood in his stool.   A&O x 4. Talkative, some sx of dementia but not officially dx.  No IV Access.  No COVID exposure reported. EMS thinks he reported being vaccinated. No pain.   '

## 2019-10-21 NOTE — Discharge Instructions (Signed)
You have brown stool right now with a tiny bit of blood and your blood count is normal right now.  Hold Plavix for 2 days.  Increase Protonix to 40 mg twice daily.  Follow-up with your GI doctor.  If you are unhappy with your nursing home, you need to talk to the case manager there to request a transfer to a different nursing home.  Return to ER if you have more blood in your stools, abdominal pain, vomiting

## 2019-10-21 NOTE — ED Notes (Signed)
MD @ bedside. Rectal exam performed and stool sample collected

## 2019-10-22 ENCOUNTER — Telehealth: Payer: Self-pay | Admitting: *Deleted

## 2019-10-22 DIAGNOSIS — I429 Cardiomyopathy, unspecified: Secondary | ICD-10-CM | POA: Diagnosis not present

## 2019-10-22 DIAGNOSIS — G9341 Metabolic encephalopathy: Secondary | ICD-10-CM | POA: Diagnosis not present

## 2019-10-22 DIAGNOSIS — R05 Cough: Secondary | ICD-10-CM | POA: Diagnosis not present

## 2019-10-22 DIAGNOSIS — I5022 Chronic systolic (congestive) heart failure: Secondary | ICD-10-CM | POA: Diagnosis not present

## 2019-10-22 DIAGNOSIS — J690 Pneumonitis due to inhalation of food and vomit: Secondary | ICD-10-CM | POA: Diagnosis not present

## 2019-10-22 DIAGNOSIS — R531 Weakness: Secondary | ICD-10-CM | POA: Diagnosis not present

## 2019-10-22 DIAGNOSIS — K627 Radiation proctitis: Secondary | ICD-10-CM | POA: Diagnosis not present

## 2019-10-22 DIAGNOSIS — D62 Acute posthemorrhagic anemia: Secondary | ICD-10-CM | POA: Diagnosis not present

## 2019-10-22 DIAGNOSIS — J984 Other disorders of lung: Secondary | ICD-10-CM | POA: Diagnosis not present

## 2019-10-22 DIAGNOSIS — K921 Melena: Secondary | ICD-10-CM | POA: Diagnosis not present

## 2019-10-22 DIAGNOSIS — R000 Tachycardia, unspecified: Secondary | ICD-10-CM | POA: Diagnosis not present

## 2019-10-22 NOTE — Telephone Encounter (Signed)
TOC CM spoke [place]. Family is trying to locate pt. He was sent out via EMS to hospital. Pt is not showing in [facility] system. Contacted EMS and pt was taken to [facility]. TOC CM contacted wife and sister, Estell Harpin #[phone]. Notified Juliann Pulse, [employer]. [place], [facility] ED TOC CM [phone]

## 2019-10-23 DIAGNOSIS — I2510 Atherosclerotic heart disease of native coronary artery without angina pectoris: Secondary | ICD-10-CM | POA: Diagnosis present

## 2019-10-23 DIAGNOSIS — R278 Other lack of coordination: Secondary | ICD-10-CM | POA: Diagnosis not present

## 2019-10-23 DIAGNOSIS — N179 Acute kidney failure, unspecified: Secondary | ICD-10-CM | POA: Diagnosis not present

## 2019-10-23 DIAGNOSIS — I420 Dilated cardiomyopathy: Secondary | ICD-10-CM | POA: Diagnosis not present

## 2019-10-23 DIAGNOSIS — K259 Gastric ulcer, unspecified as acute or chronic, without hemorrhage or perforation: Secondary | ICD-10-CM | POA: Diagnosis not present

## 2019-10-23 DIAGNOSIS — I808 Phlebitis and thrombophlebitis of other sites: Secondary | ICD-10-CM | POA: Diagnosis not present

## 2019-10-23 DIAGNOSIS — R609 Edema, unspecified: Secondary | ICD-10-CM | POA: Diagnosis not present

## 2019-10-23 DIAGNOSIS — Z23 Encounter for immunization: Secondary | ICD-10-CM | POA: Diagnosis not present

## 2019-10-23 DIAGNOSIS — R41841 Cognitive communication deficit: Secondary | ICD-10-CM | POA: Diagnosis not present

## 2019-10-23 DIAGNOSIS — R1310 Dysphagia, unspecified: Secondary | ICD-10-CM | POA: Diagnosis not present

## 2019-10-23 DIAGNOSIS — N401 Enlarged prostate with lower urinary tract symptoms: Secondary | ICD-10-CM | POA: Diagnosis not present

## 2019-10-23 DIAGNOSIS — I63511 Cerebral infarction due to unspecified occlusion or stenosis of right middle cerebral artery: Secondary | ICD-10-CM | POA: Diagnosis not present

## 2019-10-23 DIAGNOSIS — Z20822 Contact with and (suspected) exposure to covid-19: Secondary | ICD-10-CM | POA: Diagnosis not present

## 2019-10-23 DIAGNOSIS — G2401 Drug induced subacute dyskinesia: Secondary | ICD-10-CM | POA: Diagnosis not present

## 2019-10-23 DIAGNOSIS — I447 Left bundle-branch block, unspecified: Secondary | ICD-10-CM | POA: Diagnosis not present

## 2019-10-23 DIAGNOSIS — N400 Enlarged prostate without lower urinary tract symptoms: Secondary | ICD-10-CM | POA: Diagnosis not present

## 2019-10-23 DIAGNOSIS — I509 Heart failure, unspecified: Secondary | ICD-10-CM | POA: Diagnosis not present

## 2019-10-23 DIAGNOSIS — Z7901 Long term (current) use of anticoagulants: Secondary | ICD-10-CM | POA: Diagnosis not present

## 2019-10-23 DIAGNOSIS — R112 Nausea with vomiting, unspecified: Secondary | ICD-10-CM | POA: Diagnosis not present

## 2019-10-23 DIAGNOSIS — R0602 Shortness of breath: Secondary | ICD-10-CM | POA: Diagnosis not present

## 2019-10-23 DIAGNOSIS — N138 Other obstructive and reflux uropathy: Secondary | ICD-10-CM | POA: Diagnosis not present

## 2019-10-23 DIAGNOSIS — F329 Major depressive disorder, single episode, unspecified: Secondary | ICD-10-CM | POA: Diagnosis not present

## 2019-10-23 DIAGNOSIS — K921 Melena: Secondary | ICD-10-CM | POA: Diagnosis not present

## 2019-10-23 DIAGNOSIS — Z7902 Long term (current) use of antithrombotics/antiplatelets: Secondary | ICD-10-CM | POA: Diagnosis not present

## 2019-10-23 DIAGNOSIS — R05 Cough: Secondary | ICD-10-CM | POA: Diagnosis not present

## 2019-10-23 DIAGNOSIS — D500 Iron deficiency anemia secondary to blood loss (chronic): Secondary | ICD-10-CM | POA: Diagnosis present

## 2019-10-23 DIAGNOSIS — E860 Dehydration: Secondary | ICD-10-CM | POA: Diagnosis not present

## 2019-10-23 DIAGNOSIS — T8089XA Other complications following infusion, transfusion and therapeutic injection, initial encounter: Secondary | ICD-10-CM | POA: Diagnosis not present

## 2019-10-23 DIAGNOSIS — M6250 Muscle wasting and atrophy, not elsewhere classified, unspecified site: Secondary | ICD-10-CM | POA: Diagnosis not present

## 2019-10-23 DIAGNOSIS — I82621 Acute embolism and thrombosis of deep veins of right upper extremity: Secondary | ICD-10-CM | POA: Diagnosis not present

## 2019-10-23 DIAGNOSIS — Z7401 Bed confinement status: Secondary | ICD-10-CM | POA: Diagnosis not present

## 2019-10-23 DIAGNOSIS — I63411 Cerebral infarction due to embolism of right middle cerebral artery: Secondary | ICD-10-CM | POA: Diagnosis not present

## 2019-10-23 DIAGNOSIS — F05 Delirium due to known physiological condition: Secondary | ICD-10-CM | POA: Diagnosis not present

## 2019-10-23 DIAGNOSIS — Z931 Gastrostomy status: Secondary | ICD-10-CM | POA: Diagnosis not present

## 2019-10-23 DIAGNOSIS — D649 Anemia, unspecified: Secondary | ICD-10-CM | POA: Diagnosis not present

## 2019-10-23 DIAGNOSIS — J690 Pneumonitis due to inhalation of food and vomit: Secondary | ICD-10-CM | POA: Diagnosis present

## 2019-10-23 DIAGNOSIS — M2550 Pain in unspecified joint: Secondary | ICD-10-CM | POA: Diagnosis not present

## 2019-10-23 DIAGNOSIS — J189 Pneumonia, unspecified organism: Secondary | ICD-10-CM | POA: Diagnosis not present

## 2019-10-23 DIAGNOSIS — G9341 Metabolic encephalopathy: Secondary | ICD-10-CM | POA: Diagnosis not present

## 2019-10-23 DIAGNOSIS — R531 Weakness: Secondary | ICD-10-CM | POA: Diagnosis not present

## 2019-10-23 DIAGNOSIS — I480 Paroxysmal atrial fibrillation: Secondary | ICD-10-CM | POA: Diagnosis present

## 2019-10-23 DIAGNOSIS — R0902 Hypoxemia: Secondary | ICD-10-CM | POA: Diagnosis not present

## 2019-10-23 DIAGNOSIS — K627 Radiation proctitis: Secondary | ICD-10-CM | POA: Diagnosis present

## 2019-10-23 DIAGNOSIS — I69391 Dysphagia following cerebral infarction: Secondary | ICD-10-CM | POA: Diagnosis not present

## 2019-10-23 DIAGNOSIS — K922 Gastrointestinal hemorrhage, unspecified: Secondary | ICD-10-CM | POA: Diagnosis not present

## 2019-10-23 DIAGNOSIS — Z515 Encounter for palliative care: Secondary | ICD-10-CM | POA: Diagnosis not present

## 2019-10-23 DIAGNOSIS — Z7189 Other specified counseling: Secondary | ICD-10-CM | POA: Diagnosis not present

## 2019-10-23 DIAGNOSIS — J9611 Chronic respiratory failure with hypoxia: Secondary | ICD-10-CM | POA: Diagnosis not present

## 2019-10-23 DIAGNOSIS — R1312 Dysphagia, oropharyngeal phase: Secondary | ICD-10-CM | POA: Diagnosis present

## 2019-10-23 DIAGNOSIS — I5022 Chronic systolic (congestive) heart failure: Secondary | ICD-10-CM | POA: Diagnosis present

## 2019-10-23 DIAGNOSIS — R410 Disorientation, unspecified: Secondary | ICD-10-CM | POA: Diagnosis not present

## 2019-10-23 DIAGNOSIS — Z8546 Personal history of malignant neoplasm of prostate: Secondary | ICD-10-CM | POA: Diagnosis not present

## 2019-10-23 DIAGNOSIS — E876 Hypokalemia: Secondary | ICD-10-CM | POA: Diagnosis not present

## 2019-10-23 DIAGNOSIS — K5721 Diverticulitis of large intestine with perforation and abscess with bleeding: Secondary | ICD-10-CM | POA: Diagnosis not present

## 2019-10-23 DIAGNOSIS — T82898A Other specified complication of vascular prosthetic devices, implants and grafts, initial encounter: Secondary | ICD-10-CM | POA: Diagnosis not present

## 2019-10-23 DIAGNOSIS — Z66 Do not resuscitate: Secondary | ICD-10-CM | POA: Diagnosis not present

## 2019-10-23 DIAGNOSIS — I110 Hypertensive heart disease with heart failure: Secondary | ICD-10-CM | POA: Diagnosis not present

## 2019-10-23 DIAGNOSIS — I255 Ischemic cardiomyopathy: Secondary | ICD-10-CM | POA: Diagnosis not present

## 2019-10-23 DIAGNOSIS — Z4682 Encounter for fitting and adjustment of non-vascular catheter: Secondary | ICD-10-CM | POA: Diagnosis not present

## 2019-10-23 DIAGNOSIS — I429 Cardiomyopathy, unspecified: Secondary | ICD-10-CM | POA: Diagnosis present

## 2019-10-23 DIAGNOSIS — I10 Essential (primary) hypertension: Secondary | ICD-10-CM | POA: Diagnosis not present

## 2019-10-23 DIAGNOSIS — D62 Acute posthemorrhagic anemia: Secondary | ICD-10-CM | POA: Diagnosis present

## 2019-11-06 DIAGNOSIS — F329 Major depressive disorder, single episode, unspecified: Secondary | ICD-10-CM | POA: Diagnosis not present

## 2019-11-06 DIAGNOSIS — Z23 Encounter for immunization: Secondary | ICD-10-CM | POA: Diagnosis not present

## 2019-11-06 DIAGNOSIS — Z9111 Patient's noncompliance with dietary regimen: Secondary | ICD-10-CM | POA: Diagnosis not present

## 2019-11-06 DIAGNOSIS — E860 Dehydration: Secondary | ICD-10-CM | POA: Diagnosis not present

## 2019-11-06 DIAGNOSIS — M6250 Muscle wasting and atrophy, not elsewhere classified, unspecified site: Secondary | ICD-10-CM | POA: Diagnosis not present

## 2019-11-06 DIAGNOSIS — I69391 Dysphagia following cerebral infarction: Secondary | ICD-10-CM | POA: Diagnosis not present

## 2019-11-06 DIAGNOSIS — Z931 Gastrostomy status: Secondary | ICD-10-CM | POA: Diagnosis not present

## 2019-11-06 DIAGNOSIS — I63411 Cerebral infarction due to embolism of right middle cerebral artery: Secondary | ICD-10-CM | POA: Diagnosis not present

## 2019-11-06 DIAGNOSIS — R1312 Dysphagia, oropharyngeal phase: Secondary | ICD-10-CM | POA: Diagnosis not present

## 2019-11-06 DIAGNOSIS — R0902 Hypoxemia: Secondary | ICD-10-CM | POA: Diagnosis not present

## 2019-11-06 DIAGNOSIS — N400 Enlarged prostate without lower urinary tract symptoms: Secondary | ICD-10-CM | POA: Diagnosis not present

## 2019-11-06 DIAGNOSIS — R799 Abnormal finding of blood chemistry, unspecified: Secondary | ICD-10-CM | POA: Diagnosis not present

## 2019-11-06 DIAGNOSIS — R278 Other lack of coordination: Secondary | ICD-10-CM | POA: Diagnosis not present

## 2019-11-06 DIAGNOSIS — F05 Delirium due to known physiological condition: Secondary | ICD-10-CM | POA: Diagnosis not present

## 2019-11-06 DIAGNOSIS — K5721 Diverticulitis of large intestine with perforation and abscess with bleeding: Secondary | ICD-10-CM | POA: Diagnosis not present

## 2019-11-06 DIAGNOSIS — K627 Radiation proctitis: Secondary | ICD-10-CM | POA: Diagnosis not present

## 2019-11-06 DIAGNOSIS — Z7901 Long term (current) use of anticoagulants: Secondary | ICD-10-CM | POA: Diagnosis not present

## 2019-11-06 DIAGNOSIS — I509 Heart failure, unspecified: Secondary | ICD-10-CM | POA: Diagnosis not present

## 2019-11-06 DIAGNOSIS — J189 Pneumonia, unspecified organism: Secondary | ICD-10-CM | POA: Diagnosis not present

## 2019-11-06 DIAGNOSIS — T82898A Other specified complication of vascular prosthetic devices, implants and grafts, initial encounter: Secondary | ICD-10-CM | POA: Diagnosis not present

## 2019-11-06 DIAGNOSIS — G2401 Drug induced subacute dyskinesia: Secondary | ICD-10-CM | POA: Diagnosis not present

## 2019-11-06 DIAGNOSIS — T8089XA Other complications following infusion, transfusion and therapeutic injection, initial encounter: Secondary | ICD-10-CM | POA: Diagnosis not present

## 2019-11-06 DIAGNOSIS — F413 Other mixed anxiety disorders: Secondary | ICD-10-CM | POA: Diagnosis not present

## 2019-11-06 DIAGNOSIS — Z7902 Long term (current) use of antithrombotics/antiplatelets: Secondary | ICD-10-CM | POA: Diagnosis not present

## 2019-11-06 DIAGNOSIS — N401 Enlarged prostate with lower urinary tract symptoms: Secondary | ICD-10-CM | POA: Diagnosis not present

## 2019-11-06 DIAGNOSIS — Z7401 Bed confinement status: Secondary | ICD-10-CM | POA: Diagnosis not present

## 2019-11-06 DIAGNOSIS — I5022 Chronic systolic (congestive) heart failure: Secondary | ICD-10-CM | POA: Diagnosis not present

## 2019-11-06 DIAGNOSIS — D500 Iron deficiency anemia secondary to blood loss (chronic): Secondary | ICD-10-CM | POA: Diagnosis not present

## 2019-11-06 DIAGNOSIS — I63511 Cerebral infarction due to unspecified occlusion or stenosis of right middle cerebral artery: Secondary | ICD-10-CM | POA: Diagnosis not present

## 2019-11-06 DIAGNOSIS — R41841 Cognitive communication deficit: Secondary | ICD-10-CM | POA: Diagnosis not present

## 2019-11-06 DIAGNOSIS — N179 Acute kidney failure, unspecified: Secondary | ICD-10-CM | POA: Diagnosis not present

## 2019-11-06 DIAGNOSIS — I420 Dilated cardiomyopathy: Secondary | ICD-10-CM | POA: Diagnosis not present

## 2019-11-06 DIAGNOSIS — K259 Gastric ulcer, unspecified as acute or chronic, without hemorrhage or perforation: Secondary | ICD-10-CM | POA: Diagnosis not present

## 2019-11-06 DIAGNOSIS — K922 Gastrointestinal hemorrhage, unspecified: Secondary | ICD-10-CM | POA: Diagnosis not present

## 2019-11-06 DIAGNOSIS — I10 Essential (primary) hypertension: Secondary | ICD-10-CM | POA: Diagnosis not present

## 2019-11-06 DIAGNOSIS — D649 Anemia, unspecified: Secondary | ICD-10-CM | POA: Diagnosis not present

## 2019-11-06 DIAGNOSIS — R05 Cough: Secondary | ICD-10-CM | POA: Diagnosis not present

## 2019-11-06 DIAGNOSIS — E876 Hypokalemia: Secondary | ICD-10-CM | POA: Diagnosis not present

## 2019-11-06 DIAGNOSIS — R1310 Dysphagia, unspecified: Secondary | ICD-10-CM | POA: Diagnosis not present

## 2019-11-06 DIAGNOSIS — J9611 Chronic respiratory failure with hypoxia: Secondary | ICD-10-CM | POA: Diagnosis not present

## 2019-11-06 DIAGNOSIS — I447 Left bundle-branch block, unspecified: Secondary | ICD-10-CM | POA: Diagnosis not present

## 2019-11-06 DIAGNOSIS — K279 Peptic ulcer, site unspecified, unspecified as acute or chronic, without hemorrhage or perforation: Secondary | ICD-10-CM | POA: Diagnosis not present

## 2019-11-06 DIAGNOSIS — I808 Phlebitis and thrombophlebitis of other sites: Secondary | ICD-10-CM | POA: Diagnosis not present

## 2019-11-06 DIAGNOSIS — I429 Cardiomyopathy, unspecified: Secondary | ICD-10-CM | POA: Diagnosis not present

## 2019-11-06 DIAGNOSIS — N138 Other obstructive and reflux uropathy: Secondary | ICD-10-CM | POA: Diagnosis not present

## 2019-11-06 DIAGNOSIS — Z8546 Personal history of malignant neoplasm of prostate: Secondary | ICD-10-CM | POA: Diagnosis not present

## 2019-11-06 DIAGNOSIS — J690 Pneumonitis due to inhalation of food and vomit: Secondary | ICD-10-CM | POA: Diagnosis not present

## 2019-11-06 DIAGNOSIS — G9341 Metabolic encephalopathy: Secondary | ICD-10-CM | POA: Diagnosis not present

## 2019-11-06 DIAGNOSIS — Z20822 Contact with and (suspected) exposure to covid-19: Secondary | ICD-10-CM | POA: Diagnosis not present

## 2019-11-06 DIAGNOSIS — R609 Edema, unspecified: Secondary | ICD-10-CM | POA: Diagnosis not present

## 2019-11-06 DIAGNOSIS — J9811 Atelectasis: Secondary | ICD-10-CM | POA: Diagnosis not present

## 2019-11-06 DIAGNOSIS — I110 Hypertensive heart disease with heart failure: Secondary | ICD-10-CM | POA: Diagnosis not present

## 2019-11-06 DIAGNOSIS — I480 Paroxysmal atrial fibrillation: Secondary | ICD-10-CM | POA: Diagnosis not present

## 2019-11-06 DIAGNOSIS — I2510 Atherosclerotic heart disease of native coronary artery without angina pectoris: Secondary | ICD-10-CM | POA: Diagnosis not present

## 2019-11-06 DIAGNOSIS — Z66 Do not resuscitate: Secondary | ICD-10-CM | POA: Diagnosis not present

## 2019-11-06 DIAGNOSIS — D62 Acute posthemorrhagic anemia: Secondary | ICD-10-CM | POA: Diagnosis not present

## 2019-11-06 DIAGNOSIS — R531 Weakness: Secondary | ICD-10-CM | POA: Diagnosis not present

## 2019-11-06 DIAGNOSIS — M2550 Pain in unspecified joint: Secondary | ICD-10-CM | POA: Diagnosis not present

## 2019-11-09 DIAGNOSIS — D62 Acute posthemorrhagic anemia: Secondary | ICD-10-CM | POA: Diagnosis not present

## 2019-11-09 DIAGNOSIS — R1312 Dysphagia, oropharyngeal phase: Secondary | ICD-10-CM | POA: Diagnosis not present

## 2019-11-09 DIAGNOSIS — J9811 Atelectasis: Secondary | ICD-10-CM | POA: Diagnosis not present

## 2019-11-09 DIAGNOSIS — I110 Hypertensive heart disease with heart failure: Secondary | ICD-10-CM | POA: Diagnosis not present

## 2019-11-09 DIAGNOSIS — I5022 Chronic systolic (congestive) heart failure: Secondary | ICD-10-CM | POA: Diagnosis not present

## 2019-11-09 DIAGNOSIS — I69391 Dysphagia following cerebral infarction: Secondary | ICD-10-CM | POA: Diagnosis not present

## 2019-11-09 DIAGNOSIS — J9611 Chronic respiratory failure with hypoxia: Secondary | ICD-10-CM | POA: Diagnosis not present

## 2019-11-09 DIAGNOSIS — K279 Peptic ulcer, site unspecified, unspecified as acute or chronic, without hemorrhage or perforation: Secondary | ICD-10-CM | POA: Diagnosis not present

## 2019-11-09 DIAGNOSIS — K922 Gastrointestinal hemorrhage, unspecified: Secondary | ICD-10-CM | POA: Diagnosis not present

## 2019-11-13 DIAGNOSIS — K922 Gastrointestinal hemorrhage, unspecified: Secondary | ICD-10-CM | POA: Diagnosis not present

## 2019-11-17 DIAGNOSIS — F413 Other mixed anxiety disorders: Secondary | ICD-10-CM | POA: Diagnosis not present

## 2019-11-18 DIAGNOSIS — R799 Abnormal finding of blood chemistry, unspecified: Secondary | ICD-10-CM | POA: Diagnosis not present

## 2019-11-18 DIAGNOSIS — D62 Acute posthemorrhagic anemia: Secondary | ICD-10-CM | POA: Diagnosis not present

## 2019-11-18 DIAGNOSIS — Z9111 Patient's noncompliance with dietary regimen: Secondary | ICD-10-CM | POA: Diagnosis not present

## 2019-11-18 DIAGNOSIS — K922 Gastrointestinal hemorrhage, unspecified: Secondary | ICD-10-CM | POA: Diagnosis not present

## 2019-11-19 ENCOUNTER — Other Ambulatory Visit: Payer: Self-pay

## 2019-11-19 NOTE — Patient Outreach (Signed)
[facility]) Care Management  11/19/2019  Stephen Copeland 05-20-1940 [id number]   First telephone outreach attempt to obtain mRS. No answer.  [facility] Management Assistant [phone]

## 2019-11-23 ENCOUNTER — Other Ambulatory Visit: Payer: Self-pay

## 2019-11-23 NOTE — Patient Outreach (Signed)
[facility]) Care Management  11/23/2019  Stephen Copeland 1940-12-05 [id number]   Telephone outreach to patient's wife to obtain mRS was successfully completed. MRS=5.  I was not able to get in touch with patient or his nurse at [facility] facility in [facility].  [place] Management Assistant [phone]

## 2019-11-30 DIAGNOSIS — R1312 Dysphagia, oropharyngeal phase: Secondary | ICD-10-CM | POA: Diagnosis not present

## 2019-11-30 DIAGNOSIS — K922 Gastrointestinal hemorrhage, unspecified: Secondary | ICD-10-CM | POA: Diagnosis not present

## 2019-11-30 DIAGNOSIS — I480 Paroxysmal atrial fibrillation: Secondary | ICD-10-CM | POA: Diagnosis not present

## 2019-11-30 DIAGNOSIS — I69391 Dysphagia following cerebral infarction: Secondary | ICD-10-CM | POA: Diagnosis not present

## 2019-11-30 DIAGNOSIS — I5022 Chronic systolic (congestive) heart failure: Secondary | ICD-10-CM | POA: Diagnosis not present

## 2019-11-30 DIAGNOSIS — I110 Hypertensive heart disease with heart failure: Secondary | ICD-10-CM | POA: Diagnosis not present

## 2019-11-30 DIAGNOSIS — K279 Peptic ulcer, site unspecified, unspecified as acute or chronic, without hemorrhage or perforation: Secondary | ICD-10-CM | POA: Diagnosis not present

## 2019-11-30 DIAGNOSIS — J9611 Chronic respiratory failure with hypoxia: Secondary | ICD-10-CM | POA: Diagnosis not present

## 2019-12-01 DIAGNOSIS — K5721 Diverticulitis of large intestine with perforation and abscess with bleeding: Secondary | ICD-10-CM | POA: Diagnosis not present

## 2019-12-04 DIAGNOSIS — Z888 Allergy status to other drugs, medicaments and biological substances status: Secondary | ICD-10-CM | POA: Diagnosis not present

## 2019-12-04 DIAGNOSIS — I10 Essential (primary) hypertension: Secondary | ICD-10-CM | POA: Diagnosis not present

## 2019-12-04 DIAGNOSIS — I2510 Atherosclerotic heart disease of native coronary artery without angina pectoris: Secondary | ICD-10-CM | POA: Diagnosis not present

## 2019-12-04 DIAGNOSIS — R0902 Hypoxemia: Secondary | ICD-10-CM | POA: Diagnosis not present

## 2019-12-04 DIAGNOSIS — K9423 Gastrostomy malfunction: Secondary | ICD-10-CM | POA: Diagnosis not present

## 2019-12-04 DIAGNOSIS — I509 Heart failure, unspecified: Secondary | ICD-10-CM | POA: Diagnosis not present

## 2019-12-04 DIAGNOSIS — Z79899 Other long term (current) drug therapy: Secondary | ICD-10-CM | POA: Diagnosis not present

## 2019-12-05 DIAGNOSIS — M2550 Pain in unspecified joint: Secondary | ICD-10-CM | POA: Diagnosis not present

## 2019-12-05 DIAGNOSIS — Z7401 Bed confinement status: Secondary | ICD-10-CM | POA: Diagnosis not present

## 2019-12-05 DIAGNOSIS — R0902 Hypoxemia: Secondary | ICD-10-CM | POA: Diagnosis not present

## 2019-12-05 DIAGNOSIS — R29898 Other symptoms and signs involving the musculoskeletal system: Secondary | ICD-10-CM | POA: Diagnosis not present

## 2019-12-08 DIAGNOSIS — Z0000 Encounter for general adult medical examination without abnormal findings: Secondary | ICD-10-CM | POA: Diagnosis not present

## 2019-12-08 DIAGNOSIS — R1312 Dysphagia, oropharyngeal phase: Secondary | ICD-10-CM | POA: Diagnosis not present

## 2019-12-08 DIAGNOSIS — Z7189 Other specified counseling: Secondary | ICD-10-CM | POA: Diagnosis not present

## 2019-12-08 DIAGNOSIS — Z931 Gastrostomy status: Secondary | ICD-10-CM | POA: Diagnosis not present

## 2019-12-08 DIAGNOSIS — I6930 Unspecified sequelae of cerebral infarction: Secondary | ICD-10-CM | POA: Diagnosis not present

## 2019-12-08 DIAGNOSIS — I5022 Chronic systolic (congestive) heart failure: Secondary | ICD-10-CM | POA: Diagnosis not present

## 2019-12-09 DIAGNOSIS — J189 Pneumonia, unspecified organism: Secondary | ICD-10-CM | POA: Diagnosis not present

## 2019-12-10 ENCOUNTER — Inpatient Hospital Stay (HOSPITAL_COMMUNITY): Payer: Medicare Other

## 2019-12-10 ENCOUNTER — Inpatient Hospital Stay (HOSPITAL_COMMUNITY)
Admission: EM | Admit: 2019-12-10 | Discharge: 2019-12-14 | DRG: 871 | Disposition: A | Discharge status: Hospice | Payer: Medicare Other | Attending: Internal Medicine | Admitting: Internal Medicine

## 2019-12-10 ENCOUNTER — Other Ambulatory Visit: Payer: Self-pay

## 2019-12-10 ENCOUNTER — Emergency Department (HOSPITAL_COMMUNITY): Payer: Medicare Other

## 2019-12-10 ENCOUNTER — Encounter (HOSPITAL_COMMUNITY): Payer: Self-pay

## 2019-12-10 DIAGNOSIS — A419 Sepsis, unspecified organism: Principal | ICD-10-CM | POA: Diagnosis present

## 2019-12-10 DIAGNOSIS — W19XXXA Unspecified fall, initial encounter: Secondary | ICD-10-CM | POA: Diagnosis present

## 2019-12-10 DIAGNOSIS — Z515 Encounter for palliative care: Secondary | ICD-10-CM

## 2019-12-10 DIAGNOSIS — Z7902 Long term (current) use of antithrombotics/antiplatelets: Secondary | ICD-10-CM

## 2019-12-10 DIAGNOSIS — S300XXA Contusion of lower back and pelvis, initial encounter: Secondary | ICD-10-CM | POA: Diagnosis present

## 2019-12-10 DIAGNOSIS — I69322 Dysarthria following cerebral infarction: Secondary | ICD-10-CM

## 2019-12-10 DIAGNOSIS — Z20822 Contact with and (suspected) exposure to covid-19: Secondary | ICD-10-CM | POA: Diagnosis not present

## 2019-12-10 DIAGNOSIS — E860 Dehydration: Secondary | ICD-10-CM | POA: Diagnosis present

## 2019-12-10 DIAGNOSIS — I480 Paroxysmal atrial fibrillation: Secondary | ICD-10-CM | POA: Diagnosis present

## 2019-12-10 DIAGNOSIS — Z7401 Bed confinement status: Secondary | ICD-10-CM | POA: Diagnosis not present

## 2019-12-10 DIAGNOSIS — Z888 Allergy status to other drugs, medicaments and biological substances status: Secondary | ICD-10-CM

## 2019-12-10 DIAGNOSIS — I5022 Chronic systolic (congestive) heart failure: Secondary | ICD-10-CM | POA: Diagnosis present

## 2019-12-10 DIAGNOSIS — D649 Anemia, unspecified: Secondary | ICD-10-CM | POA: Diagnosis present

## 2019-12-10 DIAGNOSIS — I69391 Dysphagia following cerebral infarction: Secondary | ICD-10-CM | POA: Diagnosis not present

## 2019-12-10 DIAGNOSIS — R627 Adult failure to thrive: Secondary | ICD-10-CM | POA: Diagnosis present

## 2019-12-10 DIAGNOSIS — Z7189 Other specified counseling: Secondary | ICD-10-CM | POA: Diagnosis not present

## 2019-12-10 DIAGNOSIS — I959 Hypotension, unspecified: Secondary | ICD-10-CM | POA: Diagnosis present

## 2019-12-10 DIAGNOSIS — E7800 Pure hypercholesterolemia, unspecified: Secondary | ICD-10-CM | POA: Diagnosis not present

## 2019-12-10 DIAGNOSIS — E876 Hypokalemia: Secondary | ICD-10-CM | POA: Diagnosis present

## 2019-12-10 DIAGNOSIS — N183 Chronic kidney disease, stage 3 unspecified: Secondary | ICD-10-CM | POA: Diagnosis present

## 2019-12-10 DIAGNOSIS — J189 Pneumonia, unspecified organism: Secondary | ICD-10-CM

## 2019-12-10 DIAGNOSIS — M419 Scoliosis, unspecified: Secondary | ICD-10-CM | POA: Diagnosis present

## 2019-12-10 DIAGNOSIS — Z431 Encounter for attention to gastrostomy: Secondary | ICD-10-CM | POA: Diagnosis not present

## 2019-12-10 DIAGNOSIS — I2510 Atherosclerotic heart disease of native coronary artery without angina pectoris: Secondary | ICD-10-CM | POA: Diagnosis present

## 2019-12-10 DIAGNOSIS — I420 Dilated cardiomyopathy: Secondary | ICD-10-CM | POA: Diagnosis not present

## 2019-12-10 DIAGNOSIS — Z931 Gastrostomy status: Secondary | ICD-10-CM

## 2019-12-10 DIAGNOSIS — K219 Gastro-esophageal reflux disease without esophagitis: Secondary | ICD-10-CM | POA: Diagnosis present

## 2019-12-10 DIAGNOSIS — I499 Cardiac arrhythmia, unspecified: Secondary | ICD-10-CM | POA: Diagnosis not present

## 2019-12-10 DIAGNOSIS — E785 Hyperlipidemia, unspecified: Secondary | ICD-10-CM | POA: Diagnosis present

## 2019-12-10 DIAGNOSIS — E870 Hyperosmolality and hypernatremia: Secondary | ICD-10-CM | POA: Diagnosis present

## 2019-12-10 DIAGNOSIS — Z79899 Other long term (current) drug therapy: Secondary | ICD-10-CM

## 2019-12-10 DIAGNOSIS — I272 Pulmonary hypertension, unspecified: Secondary | ICD-10-CM | POA: Diagnosis not present

## 2019-12-10 DIAGNOSIS — N179 Acute kidney failure, unspecified: Secondary | ICD-10-CM | POA: Diagnosis not present

## 2019-12-10 DIAGNOSIS — R5381 Other malaise: Secondary | ICD-10-CM | POA: Diagnosis not present

## 2019-12-10 DIAGNOSIS — R1310 Dysphagia, unspecified: Secondary | ICD-10-CM | POA: Diagnosis present

## 2019-12-10 DIAGNOSIS — R471 Dysarthria and anarthria: Secondary | ICD-10-CM | POA: Diagnosis present

## 2019-12-10 DIAGNOSIS — R6520 Severe sepsis without septic shock: Secondary | ICD-10-CM

## 2019-12-10 DIAGNOSIS — J690 Pneumonitis due to inhalation of food and vomit: Secondary | ICD-10-CM | POA: Diagnosis not present

## 2019-12-10 DIAGNOSIS — Z8249 Family history of ischemic heart disease and other diseases of the circulatory system: Secondary | ICD-10-CM

## 2019-12-10 DIAGNOSIS — I130 Hypertensive heart and chronic kidney disease with heart failure and stage 1 through stage 4 chronic kidney disease, or unspecified chronic kidney disease: Secondary | ICD-10-CM | POA: Diagnosis not present

## 2019-12-10 DIAGNOSIS — R279 Unspecified lack of coordination: Secondary | ICD-10-CM | POA: Diagnosis not present

## 2019-12-10 DIAGNOSIS — R4182 Altered mental status, unspecified: Secondary | ICD-10-CM | POA: Diagnosis not present

## 2019-12-10 DIAGNOSIS — Z9114 Patient's other noncompliance with medication regimen: Secondary | ICD-10-CM

## 2019-12-10 DIAGNOSIS — I10 Essential (primary) hypertension: Secondary | ICD-10-CM | POA: Diagnosis present

## 2019-12-10 DIAGNOSIS — R0902 Hypoxemia: Secondary | ICD-10-CM | POA: Diagnosis not present

## 2019-12-10 DIAGNOSIS — N400 Enlarged prostate without lower urinary tract symptoms: Secondary | ICD-10-CM | POA: Diagnosis present

## 2019-12-10 DIAGNOSIS — Z66 Do not resuscitate: Secondary | ICD-10-CM | POA: Diagnosis present

## 2019-12-10 DIAGNOSIS — J9600 Acute respiratory failure, unspecified whether with hypoxia or hypercapnia: Secondary | ICD-10-CM | POA: Diagnosis not present

## 2019-12-10 DIAGNOSIS — Z8546 Personal history of malignant neoplasm of prostate: Secondary | ICD-10-CM

## 2019-12-10 DIAGNOSIS — M25562 Pain in left knee: Secondary | ICD-10-CM | POA: Diagnosis not present

## 2019-12-10 LAB — URINALYSIS, ROUTINE W REFLEX MICROSCOPIC
Bilirubin Urine: NEGATIVE
Glucose, UA: NEGATIVE mg/dL
Hgb urine dipstick: NEGATIVE
Ketones, ur: NEGATIVE mg/dL
Leukocytes,Ua: NEGATIVE
Nitrite: NEGATIVE
Protein, ur: NEGATIVE mg/dL
Specific Gravity, Urine: 1.017 (ref 1.005–1.030)
pH: 5 (ref 5.0–8.0)

## 2019-12-10 LAB — COMPREHENSIVE METABOLIC PANEL
ALT: 25 U/L (ref 0–44)
AST: 23 U/L (ref 15–41)
Albumin: 3.3 g/dL — ABNORMAL LOW (ref 3.5–5.0)
Alkaline Phosphatase: 91 U/L (ref 38–126)
Anion gap: 15 (ref 5–15)
BUN: 126 mg/dL — ABNORMAL HIGH (ref 8–23)
CO2: 23 mmol/L (ref 22–32)
Calcium: 9.6 mg/dL (ref 8.9–10.3)
Chloride: 113 mmol/L — ABNORMAL HIGH (ref 98–111)
Creatinine, Ser: 2.43 mg/dL — ABNORMAL HIGH (ref 0.61–1.24)
GFR calc non Af Amer: 25 mL/min — ABNORMAL LOW (ref >60)
Glucose, Bld: 124 mg/dL — ABNORMAL HIGH (ref 70–99)
Potassium: 2.9 mmol/L — ABNORMAL LOW (ref 3.5–5.1)
Sodium: 151 mmol/L — ABNORMAL HIGH (ref 135–145)
Total Bilirubin: 1.5 mg/dL — ABNORMAL HIGH (ref 0.3–1.2)
Total Protein: 7.6 g/dL (ref 6.5–8.1)

## 2019-12-10 LAB — CBC
HCT: 35 % — ABNORMAL LOW (ref 39.0–52.0)
Hemoglobin: 11.3 g/dL — ABNORMAL LOW (ref 13.0–17.0)
MCH: 28 pg (ref 26.0–34.0)
MCHC: 32.3 g/dL (ref 30.0–36.0)
MCV: 86.8 fL (ref 80.0–100.0)
Platelets: 193 10*3/uL (ref 150–400)
RBC: 4.03 MIL/uL — ABNORMAL LOW (ref 4.22–5.81)
RDW: 20.4 % — ABNORMAL HIGH (ref 11.5–15.5)
WBC: 8.1 10*3/uL (ref 4.0–10.5)
nRBC: 0.4 % — ABNORMAL HIGH (ref 0.0–0.2)

## 2019-12-10 LAB — LACTIC ACID, PLASMA
Lactic Acid, Venous: 2.6 mmol/L (ref 0.5–1.9)
Lactic Acid, Venous: 2.7 mmol/L (ref 0.5–1.9)
Lactic Acid, Venous: 1.9 mmol/L (ref 0.5–1.9)

## 2019-12-10 LAB — CBC WITH DIFFERENTIAL/PLATELET
Abs Immature Granulocytes: 0.1 10*3/uL — ABNORMAL HIGH (ref 0.00–0.07)
Basophils Absolute: 0 10*3/uL (ref 0.0–0.1)
Basophils Relative: 0 %
Eosinophils Absolute: 0.1 10*3/uL (ref 0.0–0.5)
Eosinophils Relative: 1 %
HCT: 31.7 % — ABNORMAL LOW (ref 39.0–52.0)
Hemoglobin: 9.8 g/dL — ABNORMAL LOW (ref 13.0–17.0)
Immature Granulocytes: 1 %
Lymphocytes Relative: 18 %
Lymphs Abs: 1.7 10*3/uL (ref 0.7–4.0)
MCH: 27.9 pg (ref 26.0–34.0)
MCHC: 30.9 g/dL (ref 30.0–36.0)
MCV: 90.3 fL (ref 80.0–100.0)
Monocytes Absolute: 0.6 10*3/uL (ref 0.1–1.0)
Monocytes Relative: 6 %
Neutro Abs: 6.9 10*3/uL (ref 1.7–7.7)
Neutrophils Relative %: 74 %
Platelets: 324 10*3/uL (ref 150–400)
RBC: 3.51 MIL/uL — ABNORMAL LOW (ref 4.22–5.81)
RDW: 20.3 % — ABNORMAL HIGH (ref 11.5–15.5)
WBC: 9.4 10*3/uL (ref 4.0–10.5)
nRBC: 0.2 % (ref 0.0–0.2)

## 2019-12-10 LAB — PROTIME-INR
INR: 1.1 (ref 0.8–1.2)
Prothrombin Time: 13.9 seconds (ref 11.4–15.2)

## 2019-12-10 LAB — RESPIRATORY PANEL BY RT PCR (FLU A&B, COVID)
Influenza A by PCR: NEGATIVE
Influenza B by PCR: NEGATIVE
SARS Coronavirus 2 by RT PCR: NEGATIVE

## 2019-12-10 LAB — MAGNESIUM: Magnesium: 2.4 mg/dL (ref 1.7–2.4)

## 2019-12-10 LAB — APTT: aPTT: 27 seconds (ref 24–36)

## 2019-12-10 MED ORDER — LACTATED RINGERS IV SOLN: INTRAVENOUS | Status: DC

## 2019-12-10 MED ORDER — ACETAMINOPHEN 325 MG PO TABS
650.0000 mg | ORAL_TABLET | Freq: Four times a day (QID) | ORAL | Status: DC | PRN
  Administered 2019-12-10: 650 mg via ORAL
  Filled 2019-12-10: qty 2

## 2019-12-10 MED ORDER — METOPROLOL TARTRATE 25 MG/10 ML ORAL SUSPENSION
25.0000 mg | Freq: Two times a day (BID) | ORAL | Status: DC
  Administered 2019-12-10 – 2019-12-11 (×2): 25 mg
  Filled 2019-12-10 (×2): qty 10

## 2019-12-10 MED ORDER — ACETAMINOPHEN 650 MG RE SUPP: 650.0000 mg | Freq: Four times a day (QID) | RECTAL | Status: DC | PRN

## 2019-12-10 MED ORDER — DOXAZOSIN MESYLATE 2 MG PO TABS
2.0000 mg | ORAL_TABLET | Freq: Two times a day (BID) | ORAL | Status: DC
  Administered 2019-12-10: 2 mg
  Filled 2019-12-10 (×2): qty 1

## 2019-12-10 MED ORDER — METRONIDAZOLE IN NACL 5-0.79 MG/ML-% IV SOLN
500.0000 mg | Freq: Once | INTRAVENOUS | Status: AC
  Administered 2019-12-10: 500 mg via INTRAVENOUS
  Filled 2019-12-10: qty 100

## 2019-12-10 MED ORDER — LACTATED RINGERS IV BOLUS (SEPSIS)
1000.0000 mL | Freq: Once | INTRAVENOUS | Status: AC
  Administered 2019-12-10: 1000 mL via INTRAVENOUS

## 2019-12-10 MED ORDER — SODIUM CHLORIDE 0.9 % IV SOLN: Freq: Once | INTRAVENOUS | Status: AC

## 2019-12-10 MED ORDER — SODIUM CHLORIDE 0.9 % IV SOLN
2.0000 g | INTRAVENOUS | Status: DC
  Filled 2019-12-10: qty 2

## 2019-12-10 MED ORDER — VANCOMYCIN HCL IN DEXTROSE 1-5 GM/200ML-% IV SOLN
1000.0000 mg | Freq: Once | INTRAVENOUS | Status: AC
  Administered 2019-12-10: 1000 mg via INTRAVENOUS
  Filled 2019-12-10: qty 200

## 2019-12-10 MED ORDER — POTASSIUM CHLORIDE 10 MEQ/100ML IV SOLN
10.0000 meq | INTRAVENOUS | Status: AC
  Administered 2019-12-10 (×4): 10 meq via INTRAVENOUS
  Filled 2019-12-10 (×4): qty 100

## 2019-12-10 MED ORDER — SODIUM CHLORIDE 0.9 % IV SOLN
2.0000 g | Freq: Once | INTRAVENOUS | Status: AC
  Administered 2019-12-10: 2 g via INTRAVENOUS
  Filled 2019-12-10: qty 2

## 2019-12-10 MED ORDER — CLOPIDOGREL BISULFATE 75 MG PO TABS
75.0000 mg | ORAL_TABLET | Freq: Every day | ORAL | Status: DC
  Administered 2019-12-11: 75 mg
  Filled 2019-12-10: qty 1

## 2019-12-10 MED ORDER — HEPARIN SODIUM (PORCINE) 5000 UNIT/ML IJ SOLN
5000.0000 [IU] | Freq: Three times a day (TID) | INTRAMUSCULAR | Status: DC
  Administered 2019-12-10 – 2019-12-11 (×2): 5000 [IU] via SUBCUTANEOUS
  Filled 2019-12-10 (×2): qty 1

## 2019-12-10 MED ORDER — VANCOMYCIN HCL IN DEXTROSE 1-5 GM/200ML-% IV SOLN
1000.0000 mg | INTRAVENOUS | Status: DC
  Administered 2019-12-11: 1000 mg via INTRAVENOUS
  Filled 2019-12-10: qty 200

## 2019-12-10 MED ORDER — LACTATED RINGERS IV BOLUS (SEPSIS)
250.0000 mL | Freq: Once | INTRAVENOUS | Status: AC
  Administered 2019-12-10: 250 mL via INTRAVENOUS

## 2019-12-10 MED ORDER — SILODOSIN 4 MG PO CAPS
8.0000 mg | ORAL_CAPSULE | Freq: Every day | ORAL | Status: DC
  Administered 2019-12-11: 8 mg
  Filled 2019-12-10: qty 2

## 2019-12-10 NOTE — ED Notes (Addendum)
CRITICAL VALUE ALERT  Critical Value:  Lactic acid 2.7  Date & Time Notied: 12/10/2019 1802  Provider Notified: Marylyn Ishihara, MD  Orders Received/Actions taken: new order for lactic at 2000

## 2019-12-10 NOTE — ED Triage Notes (Signed)
Patient BIB [facility] coming from home. He went to the doctor 4 days ago diagnosed with pneumonia. Wife got called to pick up patients medication and she had no way to get the medication. She is unable to help him shower, bathe, and called EMS because she does not know how to take care of him anymore. He is bed bound and altered at baseline.   EMS vitals 106/80 Pulse 70 O2 94 on room air Temp 97.49F

## 2019-12-10 NOTE — ED Provider Notes (Signed)
[place] DEPT Provider Note   CSN: [id number] Arrival date & time: 12/10/19  1226     History Chief Complaint  Patient presents with  . Pneumonia  . Failure To Thrive    Stephen Copeland is a 79 y.o. male.  79 year old male presents with cough and congestion times several days.  Diagnosed with pneumonia several days ago but it is questionable if he took the medications.  Notes increased dyspnea on exertion as well as weakness.  Also notes generalized malaise.  Patient lives at home with his wife who is now unable to take care of him due to his weakened condition.  EMS called and patient transported here        Past Medical History:  Diagnosis Date  . Acid reflux   . CHF (congestive heart failure) ([place])   . Coronary artery disease   . Dilated cardiomyopathy ([place])   . Folliculitis   . Gastric ulcer   . Hiatal hernia   . Hyperlipidemia    a. pt is adamantly against statins.  . Hypertension   . Ischemia    a. Pt states he was diagnosed with "ischemia" in the 1990s but does not know further details, denies hx of heart blockage.  . Nummular dermatitis   . Prostate cancer ([place])   . Renal disorder   . Scoliosis   . Sinus bradycardia   . Stomach ulcer    a. remote ulcer in the 1990s, no hx of bleeding.    Patient Active Problem List   Diagnosis Date Noted  . At high risk for aspiration 09/09/2019  . Atrial fibrillation ([place]) 09/09/2019  . Hematochezia   . Lower GI bleed 09/02/2019  . SAH (subarachnoid hemorrhage) (H[facility]) s/p clot retrieval and stent placement 08/06/2019  . Aspiration pneumonia ([place]), likely 08/06/2019  . 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 L BBB (bundle branch block) 08/06/2019  . Prolonged Q-T interval on ECG 08/06/2019  . 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 Frequent loose stools 08/06/2019  . Acute embolic stroke ([place])   . SBO (small bowel obstruction) ([place])   . History of prostate  cancer   . Stage 3 chronic kidney disease ([place])   . Agitation   . Supplemental oxygen dependent   . 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 Acute hypoxemic respiratory failure ([place])   . Acute ischemic stroke ([place]) -R MCA d/t R ICA and M2 occlusion s/p clot retrieval and R ICA stent 07/09/2019  . Vasovagal syncope 07/18/2017  . S/P TURP 07/18/2017  . Coronary artery disease 07/07/2017  . 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 Encounter for therapeutic drug monitoring 04/09/2017  . Right lower lobe pulmonary nodule 12/19/2016  . Noncompliance with treatment plan 12/19/2016  . Acute renal failure (ARF) ([place]) 12/18/2016  . Hyperkalemia 12/18/2016  . Hyperbilirubinemia 12/18/2016  . UTI (urinary tract infection) 05/31/2016  . Acute urinary retention 05/31/2016  . Acute kidney injury superimposed on chronic kidney disease ([place]) 05/31/2016  . Generalized weakness 05/31/2016  . Falls 05/31/2016  . Acute systolic CHF (congestive heart failure) ([place])   . Congestive dilated cardiomyopathy ([place])   . Uncontrolled hypertension   . Pulmonary hypertension ([place])   . Chronic systolic CHF (congestive heart failure) ([place])   . Paroxysmal atrial fibrillation ([place]) 12/19/2015  . Hypertension 12/19/2015  . Acute CHF ([place]) 12/19/2015  . Hyperglycemia 12/19/2015  . Acid reflux   . Hyperlipidemia   .  Sinus bradycardia   . Gastroesophageal reflux disease   . Other hyperlipidemia   . Elevated troponin     Past Surgical History:  Procedure Laterality Date  . BIOPSY  09/04/2019   Procedure: BIOPSY;  Surgeon: Ladene Artist, MD;  Location: [facility];  Service: Gastroenterology;;  . COLONOSCOPY WITH PROPOFOL N/A 09/04/2019   Procedure: COLONOSCOPY WITH PROPOFOL;  Surgeon: Ladene Artist, MD;  Location: [facility];  Service: Gastroenterology;  Laterality: N/A;  . ESOPHAGOGASTRODUODENOSCOPY (EGD) WITH PROPOFOL N/A 08/04/2019   Procedure: ESOPHAGOGASTRODUODENOSCOPY  (EGD) WITH PROPOFOL;  Surgeon: Jesusita Oka, MD;  Location: [facility];  Service: General;  Laterality: N/A;  . INGUINAL HERNIA REPAIR Left   . IR ANGIO INTRA EXTRACRAN SEL COM CAROTID INNOMINATE UNI L MOD SED  07/10/2019  . IR ANGIO VERTEBRAL SEL SUBCLAVIAN INNOMINATE UNI R MOD SED  07/10/2019  . IR CT HEAD LTD  07/10/2019  . IR INTRAVSC STENT CERV CAROTID W/O EMB-PROT MOD SED INC ANGIO  07/10/2019  . IR PERCUTANEOUS ART THROMBECTOMY/INFUSION INTRACRANIAL INC DIAG ANGIO  07/10/2019  . IR REPLC GASTRO/COLONIC TUBE PERCUT W/FLUORO  08/13/2019  . PEG PLACEMENT N/A 08/04/2019   Procedure: PERCUTANEOUS ENDOSCOPIC GASTROSTOMY (PEG) PLACEMENT;  Surgeon: Jesusita Oka, MD;  Location: [facility];  Service: General;  Laterality: N/A;  . RADIOLOGY WITH ANESTHESIA N/A 07/09/2019   Procedure: IR WITH ANESTHESIA;  Surgeon: Luanne Bras, MD;  Location: [place];  Service: Radiology;  Laterality: N/A;       Family History  Problem Relation Age of Onset  . Heart disease Mother        Further details not reported, died at age 48  . Valvular heart disease Father        H/o MV surgery, diet at age 16    Social History   Tobacco Use  . Smoking status: Never Smoker  . Smokeless tobacco: Never Used  Vaping Use  . Vaping Use: Never used  Substance Use Topics  . Alcohol use: No  . Drug use: No    Home Medications Prior to Admission medications   Medication Sig Start Date End Date Taking? Authorizing Provider  acetaminophen (TYLENOL) 325 MG tablet Take 650 mg by mouth every 6 (six) hours as needed for mild pain.    [provider]  Amino Acids-Protein Hydrolys (FEEDING SUPPLEMENT, PRO-STAT SUGAR FREE 64,) LIQD Place 30 mLs into feeding tube 2 (two) times daily. 08/26/19   Donzetta Starch, NP  clopidogrel (PLAVIX) 75 MG tablet Take 75 mg by mouth in the morning and at bedtime.    [provider]  hydrALAZINE (APRESOLINE) 25 MG tablet Place 1 tablet (25 mg total) into feeding tube every  8 (eight) hours. 08/26/19   Donzetta Starch, NP  lidocaine (XYLOCAINE) 5 % ointment Apply 1 application topically 2 (two) times daily as needed (pain). 08/26/19   Donzetta Starch, NP  metoprolol tartrate (LOPRESSOR) 25 mg/10 mL SUSP Place 10 mLs (25 mg total) into feeding tube 2 (two) times daily. 08/26/19   Donzetta Starch, NP  Nutritional Supplements (FEEDING SUPPLEMENT, JEVITY 1.5 CAL/FIBER,) LIQD Place 1,000 mLs into feeding tube continuous. 08/26/19   Donzetta Starch, NP  Nystatin (GERHARDT'S BUTT CREAM) CREA Apply 1 application topically every 8 (eight) hours as needed for irritation. 08/26/19   Donzetta Starch, NP  pantoprazole sodium (PROTONIX) 40 mg/20 mL PACK Place 20 mLs (40 mg total) into feeding tube 2 (two) times daily. 10/21/19   Shirlyn Goltz  Hsienta, MD  sucralfate (CARAFATE) 1 GM/10ML suspension Place 20 mLs (2 g total) rectally 2 (two) times daily for 28 days. 09/08/19 10/06/19  Antonieta Pert, MD  Water For Irrigation, Sterile (FREE WATER) SOLN Place 200 mLs into feeding tube every 4 (four) hours. 08/26/19   Donzetta Starch, NP    Allergies    Patient has no known allergies.  Review of Systems   Review of Systems  All other systems reviewed and are negative.   Physical Exam Updated Vital Signs BP 108/86   Pulse 74   Temp 97.8 F (36.6 C) (Axillary)   Resp 15   SpO2 97%   Physical Exam Vitals and nursing note reviewed.  Constitutional:      General: He is not in acute distress.    Appearance: Normal appearance. He is well-developed. He is not toxic-appearing.  HENT:     Head: Normocephalic and atraumatic.  Eyes:     General: Lids are normal.     Conjunctiva/sclera: Conjunctivae normal.     Pupils: Pupils are equal, round, and reactive to light.  Neck:     Thyroid: No thyroid mass.     Trachea: No tracheal deviation.  Cardiovascular:     Rate and Rhythm: Normal rate and regular rhythm.     Heart sounds: Normal heart sounds. No murmur heard.  No gallop.   Pulmonary:     Effort:  Pulmonary effort is normal. No respiratory distress.     Breath sounds: Decreased air movement present. No stridor. Decreased breath sounds present. No wheezing, rhonchi or rales.  Abdominal:     General: Bowel sounds are normal. There is no distension.     Palpations: Abdomen is soft.     Tenderness: There is no abdominal tenderness. There is no rebound.  Musculoskeletal:        General: No tenderness. Normal range of motion.     Cervical back: Normal range of motion and neck supple.  Skin:    General: Skin is warm and dry.     Findings: No abrasion or rash.  Neurological:     Mental Status: He is alert and oriented to person, place, and time.     GCS: GCS eye subscore is 4. GCS verbal subscore is 5. GCS motor subscore is 6.     Cranial Nerves: No cranial nerve deficit.     Sensory: No sensory deficit.  Psychiatric:        Speech: Speech normal.        Behavior: Behavior normal.     ED Results / Procedures / Treatments   Labs (all labs ordered are listed, but only abnormal results are displayed) Labs Reviewed  LACTIC ACID, PLASMA - Abnormal; Notable for the following components:      Result Value   Lactic Acid, Venous 2.6 (*)    All other components within normal limits  COMPREHENSIVE METABOLIC PANEL - Abnormal; Notable for the following components:   Sodium 151 (*)    Potassium 2.9 (*)    Chloride 113 (*)    Glucose, Bld 124 (*)    Creatinine, Ser 2.43 (*)    Albumin 3.3 (*)    Total Bilirubin 1.5 (*)    GFR calc non Af Amer 25 (*)    All other components within normal limits  CBC WITH DIFFERENTIAL/PLATELET - Abnormal; Notable for the following components:   RBC 3.51 (*)    Hemoglobin 9.8 (*)    HCT 31.7 (*)  RDW 20.3 (*)    Abs Immature Granulocytes 0.10 (*)    All other components within normal limits  CULTURE, BLOOD (SINGLE)  URINE CULTURE  PROTIME-INR  APTT  URINALYSIS, ROUTINE W REFLEX MICROSCOPIC  LACTIC ACID, PLASMA    EKG None  Radiology DG  Chest Port 1 View  Result Date: 12/10/2019 CLINICAL DATA:  Pneumonia EXAM: PORTABLE CHEST 1 VIEW COMPARISON:  09/08/2019 FINDINGS: Stable cardiomediastinal contours. Atherosclerotic calcification of the aortic knob. Streaky right perihilar opacity. Chronic mild interstitial prominence within the left lung base. No pleural effusion or pneumothorax. IMPRESSION: Streaky right perihilar opacity may represent atelectasis or developing infiltrate. Electronically Signed   By: Davina Poke D.O.   On: 12/10/2019 13:37    Procedures Procedures (including critical care time)  Medications Ordered in ED Medications  lactated ringers infusion (has no administration in time range)  lactated ringers infusion (has no administration in time range)  lactated ringers bolus 1,000 mL (has no administration in time range)    And  lactated ringers bolus 1,000 mL (1,000 mLs Intravenous New Bag/Given 12/10/19 1424)    And  lactated ringers bolus 250 mL (has no administration in time range)  ceFEPIme (MAXIPIME) 2 g in sodium chloride 0.9 % 100 mL IVPB (2 g Intravenous New Bag/Given 12/10/19 1443)  metroNIDAZOLE (FLAGYL) IVPB 500 mg (500 mg Intravenous New Bag/Given 12/10/19 1441)  vancomycin (VANCOCIN) IVPB 1000 mg/200 mL premix (has no administration in time range)    ED Course  I have reviewed the triage vital signs and the nursing notes.  Pertinent labs & imaging results that were available during my care of the patient were reviewed by me and considered in my medical decision making (see chart for details).    MDM Rules/Calculators/A&P                          Patient is still hypotensive and code sepsis initiated.  Given IV fluid bolus.  Blood pressure has improved.  Labs significant for acute kidney injury.  Chest x-ray shows pneumonia.  IV antibiotics started.  Patient also with hypernatremia.  Consistent with dehydration.  Will admit to the hospital service  CRITICAL CARE Performed by: Leota Jacobsen Total critical care time: 50 minutes Critical care time was exclusive of separately billable procedures and treating other patients. Critical care was necessary to treat or prevent imminent or life-threatening deterioration. Critical care was time spent personally by me on the following activities: development of treatment plan with patient and/or surrogate as well as nursing, discussions with consultants, evaluation of patient's response to treatment, examination of patient, obtaining history from patient or surrogate, ordering and performing treatments and interventions, ordering and review of laboratory studies, ordering and review of radiographic studies, pulse oximetry and re-evaluation of patient's condition.  Final Clinical Impression(s) / ED Diagnoses Final diagnoses:  None    Rx / DC Orders ED Discharge Orders    None       Lacretia Leigh, MD 12/10/19 1512

## 2019-12-10 NOTE — ED Notes (Signed)
[phone] 

## 2019-12-10 NOTE — Progress Notes (Signed)
Pharmacy Antibiotic Note  Stephen Copeland is a 79 y.o. male admitted on 12/10/2019 with pneumonia. Pharmacy has been consulted for vancomycin dosing.  Plan:  Vancomycin 1000 mg IV q36 hr  Measure vancomycin trough at steady state as indicated  SCr daily while on vanc given AKI - if SCr continues to trend up tomorrow would consider switching vancomycin to linezolid to avoid exacerbating AKI  Cefepime per MD, dosing appropriate     Temp (24hrs), Avg:97.8 F (36.6 C), Min:97.8 F (36.6 C), Max:97.8 F (36.6 C)  Recent Labs  Lab 12/10/19 1310 12/10/19 1328 12/10/19 1702  WBC  --  9.4  --   CREATININE  --  2.43*  --   LATICACIDVEN 2.6*  --  2.7*    CrCl cannot be calculated (Unknown ideal weight.).    Allergies  Allergen Reactions  . Metoclopramide Other (See Comments)    Tardive dyskinesia    Antimicrobials this admission: 10/7 vancomycin >>  10/7 cefepime >>  10/7 Flagyl x 1  Dose adjustments this admission: n/a  Microbiology results: 10/7 BCx: sent 10/7 UCx: sent   Thank you for allowing pharmacy to be a part of this patient's care.  Stephen Copeland A 12/10/2019 5:50 PM

## 2019-12-10 NOTE — ED Notes (Signed)
Stephen Copeland, sister, wants the doctor or nurse to call her with updates.

## 2019-12-10 NOTE — Progress Notes (Signed)
A consult was received from an ED physician for vancomycin and cefepime per pharmacy dosing.  The patient's profile has been reviewed for ht/wt/allergies/indication/available labs.    A one time order has been placed for vancomycin 1000mg  and cefepime 2gm IV x1.  Further antibiotics/pharmacy consults should be ordered by admitting physician if indicated.                       Thank you, Lynelle Doctor 12/10/2019  1:34 PM

## 2019-12-10 NOTE — ED Notes (Signed)
US at bedside

## 2019-12-10 NOTE — Sepsis Progress Note (Signed)
Notified bedside nurse of need to draw repeat lactic acid. 

## 2019-12-10 NOTE — H&P (Signed)
History and Physical    Stephen Copeland PPI:[id number] DOB: 11-21-1940 DOA: 12/10/2019  PCP: Clovia Cuff, MD  Patient coming from: Home  Chief Complaint: dyspnea  HPI: Stephen Copeland is a 79 y.o. male with medical history significant of stroke, HTN, a fib. Presents with 3 - 4 days of increasing cough and dyspnea. He reports that he's had increasing cough over the last several days. He reports he was diagnosed with pneumonia, but did not take any medicines because he was unable to get them. He says that he felt weak today and couldn't catch his breath. He let his wife know. She felt overwhelmed and called for EMS. When speaking with his wife, she notes that he's been increasingly weak and never really recovered from his stroke. She is finding it hard to take care of him. As his breathing worsened, she felt it necessary for him to come to the ED. They report no other aggravating or alleviating factors. They deny any other treatments.   ED Course: CXR was obtained that showed right perihilar opacity. His lactic acid was found to be elevated. He was started on sepsis protocol. Vanc, cefepime, flagyl started. TRH called for admission.    Review of Systems:  Review of systems is otherwise negative for all not mentioned in HPI.   PMHx Past Medical History:  Diagnosis Date  . Acid reflux   . CHF (congestive heart failure) ([place])   . Coronary artery disease   . Dilated cardiomyopathy ([place])   . Folliculitis   . Gastric ulcer   . Hiatal hernia   . Hyperlipidemia    a. pt is adamantly against statins.  . Hypertension   . Ischemia    a. Pt states he was diagnosed with "ischemia" in the 1990s but does not know further details, denies hx of heart blockage.  . Nummular dermatitis   . Prostate cancer ([place])   . Renal disorder   . Scoliosis   . Sinus bradycardia   . Stomach ulcer    a. remote ulcer in the 1990s, no hx of bleeding.    PSHx Past Surgical History:  Procedure Laterality Date  .  BIOPSY  09/04/2019   Procedure: BIOPSY;  Surgeon: Ladene Artist, MD;  Location: [facility];  Service: Gastroenterology;;  . COLONOSCOPY WITH PROPOFOL N/A 09/04/2019   Procedure: COLONOSCOPY WITH PROPOFOL;  Surgeon: Ladene Artist, MD;  Location: [facility];  Service: Gastroenterology;  Laterality: N/A;  . ESOPHAGOGASTRODUODENOSCOPY (EGD) WITH PROPOFOL N/A 08/04/2019   Procedure: ESOPHAGOGASTRODUODENOSCOPY (EGD) WITH PROPOFOL;  Surgeon: Jesusita Oka, MD;  Location: [facility];  Service: General;  Laterality: N/A;  . INGUINAL HERNIA REPAIR Left   . IR ANGIO INTRA EXTRACRAN SEL COM CAROTID INNOMINATE UNI L MOD SED  07/10/2019  . IR ANGIO VERTEBRAL SEL SUBCLAVIAN INNOMINATE UNI R MOD SED  07/10/2019  . IR CT HEAD LTD  07/10/2019  . IR INTRAVSC STENT CERV CAROTID W/O EMB-PROT MOD SED INC ANGIO  07/10/2019  . IR PERCUTANEOUS ART THROMBECTOMY/INFUSION INTRACRANIAL INC DIAG ANGIO  07/10/2019  . IR REPLC GASTRO/COLONIC TUBE PERCUT W/FLUORO  08/13/2019  . PEG PLACEMENT N/A 08/04/2019   Procedure: PERCUTANEOUS ENDOSCOPIC GASTROSTOMY (PEG) PLACEMENT;  Surgeon: Jesusita Oka, MD;  Location: [facility];  Service: General;  Laterality: N/A;  . RADIOLOGY WITH ANESTHESIA N/A 07/09/2019   Procedure: IR WITH ANESTHESIA;  Surgeon: Luanne Bras, MD;  Location: [facility];  Service: Radiology;  Laterality: N/A;    SocHx  reports that he has never  smoked. He has never used smokeless tobacco. He reports that he does not drink alcohol and does not use drugs.  No Known Allergies  FamHx Family History  Problem Relation Age of Onset  . Heart disease Mother        Further details not reported, died at age 55  . Valvular heart disease Father        H/o MV surgery, diet at age 58    Prior to Admission medications   Medication Sig Start Date End Date Taking? Authorizing Provider  acetaminophen (TYLENOL) 325 MG tablet Take 650 mg by mouth every 6 (six) hours as needed for mild pain.    [provider]    Amino Acids-Protein Hydrolys (FEEDING SUPPLEMENT, PRO-STAT SUGAR FREE 64,) LIQD Place 30 mLs into feeding tube 2 (two) times daily. 08/26/19   Donzetta Starch, NP  clopidogrel (PLAVIX) 75 MG tablet Take 75 mg by mouth in the morning and at bedtime.    [provider]  hydrALAZINE (APRESOLINE) 25 MG tablet Place 1 tablet (25 mg total) into feeding tube every 8 (eight) hours. 08/26/19   Donzetta Starch, NP  lidocaine (XYLOCAINE) 5 % ointment Apply 1 application topically 2 (two) times daily as needed (pain). 08/26/19   Donzetta Starch, NP  metoprolol tartrate (LOPRESSOR) 25 mg/10 mL SUSP Place 10 mLs (25 mg total) into feeding tube 2 (two) times daily. 08/26/19   Donzetta Starch, NP  Nutritional Supplements (FEEDING SUPPLEMENT, JEVITY 1.5 CAL/FIBER,) LIQD Place 1,000 mLs into feeding tube continuous. 08/26/19   Donzetta Starch, NP  Nystatin (GERHARDT'S BUTT CREAM) CREA Apply 1 application topically every 8 (eight) hours as needed for irritation. 08/26/19   Donzetta Starch, NP  pantoprazole sodium (PROTONIX) 40 mg/20 mL PACK Place 20 mLs (40 mg total) into feeding tube 2 (two) times daily. 10/21/19   Drenda Freeze, MD  sucralfate (CARAFATE) 1 GM/10ML suspension Place 20 mLs (2 g total) rectally 2 (two) times daily for 28 days. 09/08/19 10/06/19  Antonieta Pert, MD  Water For Irrigation, Sterile (FREE WATER) SOLN Place 200 mLs into feeding tube every 4 (four) hours. 08/26/19   Donzetta Starch, NP    Physical Exam: Vitals:   12/10/19 1532 12/10/19 1600 12/10/19 1615 12/10/19 1630  BP: 108/71 112/83 125/80 133/74  Pulse:  (!) 54 (!) 37 69  Resp: 19 (!) 27 16 (!) 23  Temp:      TempSrc:      SpO2: 99% 95% 100% 98%    General: 79 y.o. male resting in bed in NAD Eyes: PERRL, normal sclera ENMT: Nares patent w/o discharge, orophaynx clear, dentition normal, ears w/o discharge/lesions/ulcers Neck: Supple, trachea midline Cardiovascular: tachy, irregular, +S1, S2, no m/g/r, equal pulses  throughout Respiratory: decreased at bases, upper airway transmission w/ difficulty clearing cough, scattered rhonchi, normal WOB on 2L  GI: BS+, NDNT, no masses noted, no organomegaly noted, PEG noted MSK: No e/c/c Skin: No rashes, bruises; sacral deep tissue injury noted Neuro: A&O x 3, dysarthria (previous stroke) Psyc: Appropriate interaction and affect, calm/cooperative  Labs on Admission: I have personally reviewed following labs and imaging studies  CBC: Recent Labs  Lab 12/10/19 1328  WBC 9.4  NEUTROABS 6.9  HGB 9.8*  HCT 31.7*  MCV 90.3  PLT 007   Basic Metabolic Panel: Recent Labs  Lab 12/10/19 1328  NA 151*  K 2.9*  CL 113*  CO2 23  GLUCOSE 124*  BUN 126*  CREATININE 2.43*  CALCIUM 9.6   GFR: CrCl cannot be calculated (Unknown ideal weight.). Liver Function Tests: Recent Labs  Lab 12/10/19 1328  AST 23  ALT 25  ALKPHOS 91  BILITOT 1.5*  PROT 7.6  ALBUMIN 3.3*   No results for input(s): LIPASE, AMYLASE in the last 168 hours. No results for input(s): AMMONIA in the last 168 hours. Coagulation Profile: Recent Labs  Lab 12/10/19 1328  INR 1.1   Cardiac Enzymes: No results for input(s): CKTOTAL, CKMB, CKMBINDEX, TROPONINI in the last 168 hours. BNP (last 3 results) No results for input(s): PROBNP in the last 8760 hours. HbA1C: No results for input(s): HGBA1C in the last 72 hours. CBG: No results for input(s): GLUCAP in the last 168 hours. Lipid Profile: No results for input(s): CHOL, HDL, LDLCALC, TRIG, CHOLHDL, LDLDIRECT in the last 72 hours. Thyroid Function Tests: No results for input(s): TSH, T4TOTAL, FREET4, T3FREE, THYROIDAB in the last 72 hours. Anemia Panel: No results for input(s): VITAMINB12, FOLATE, FERRITIN, TIBC, IRON, RETICCTPCT in the last 72 hours. Urine analysis:    Component Value Date/Time   COLORURINE YELLOW 12/10/2019 1328   APPEARANCEUR CLEAR 12/10/2019 1328   LABSPEC 1.017 12/10/2019 1328   PHURINE 5.0  12/10/2019 1328   GLUCOSEU NEGATIVE 12/10/2019 1328   HGBUR NEGATIVE 12/10/2019 [place] 12/10/2019 1328   BILIRUBINUR negative 06/12/2016 [place] 12/10/2019 1328   PROTEINUR NEGATIVE 12/10/2019 1328   UROBILINOGEN 1.0 06/12/2016 1146   NITRITE NEGATIVE 12/10/2019 1328   LEUKOCYTESUR NEGATIVE 12/10/2019 1328    Radiological Exams on Admission: DG Chest Port 1 View  Result Date: 12/10/2019 CLINICAL DATA:  Pneumonia EXAM: PORTABLE CHEST 1 VIEW COMPARISON:  09/08/2019 FINDINGS: Stable cardiomediastinal contours. Atherosclerotic calcification of the aortic knob. Streaky right perihilar opacity. Chronic mild interstitial prominence within the left lung base. No pleural effusion or pneumothorax. IMPRESSION: Streaky right perihilar opacity may represent atelectasis or developing infiltrate. Electronically Signed   By: Davina Poke D.O.   On: 12/10/2019 13:37    Assessment/Plan Sepsis PNA     - admit inpatient telemetry     - sepsis criteria: tachypnea, tachycardia, source: PNA, AKI     - started on vanc, cefepime, flagyl in ED; can continue vanc, cefepime for now w/ pharm consult     - wean O2 as able  AKI     - baseline Scr is 1.1; he is 2.43 at admission     - got sepsis fluid bolus     - check renal US     - watch the amount of fluids he gets d/t history of HFrEF; give fluids in 1L aliquots  Hypernatremia Dehydration     - see AKI above  Normocytic anemia     - he is at baseline, no evidence of bleed, follow  Paroxysmal Afib, not on anticoagulation     - BP is ok right now, can resume home meds at lower dose w/ hold parameters  Hx of HTN     - can resume home meds at lower dose w/ BP hold parameters  Hx of BPH     - can resume home meds at lower dose w/ BP hold parameters  Chronic HFrEF Dilated Cardiomyopathy     - can resume home meds at lower dose w/ BP hold parameters     - last echo 07/10/19: EF 35%     - watch fluid  resuscitation; give fluids gently in 1L aliquots     -  I&Os, daily wts    Hx of CVA w/ residuals Dysphagia Dysarthria     - has PEG in place     - NPO, use PEG, dietary consult for TFs  Goals of care     - consult palliative for goals of care; possible outpt palliative follow up  Debility     - PT/OT consult     - spouse having increasing difficulty with care at home  Sacral deep tissue injury     - WOCN  Hypokalemia     - check Mg2+     - replace K+, follow  DVT prophylaxis: heparin  Code Status: DNR (confirmed with pt and confirmed with spouse by phone)   Family Communication: Spoke with spouse by phone  Consults called: None  Admission status: Inpatient d/t need for IV abx and severity of illness.  Status is: Inpatient  Remains inpatient appropriate because:Inpatient level of care appropriate due to severity of illness   Dispo: The patient is from: Home              Anticipated d/c is to: Home              Anticipated d/c date is: 2 days              Patient currently is not medically stable to d/c.  Time spent coordinating admission: 75 minutes  [facility]  If 7PM-7AM, please contact night-coverage [email or web address]  12/10/2019, 4:38 PM

## 2019-12-11 DIAGNOSIS — J189 Pneumonia, unspecified organism: Secondary | ICD-10-CM | POA: Diagnosis present

## 2019-12-11 DIAGNOSIS — E7800 Pure hypercholesterolemia, unspecified: Secondary | ICD-10-CM

## 2019-12-11 DIAGNOSIS — Z7189 Other specified counseling: Secondary | ICD-10-CM

## 2019-12-11 DIAGNOSIS — J690 Pneumonitis due to inhalation of food and vomit: Secondary | ICD-10-CM

## 2019-12-11 DIAGNOSIS — I5022 Chronic systolic (congestive) heart failure: Secondary | ICD-10-CM

## 2019-12-11 DIAGNOSIS — Z66 Do not resuscitate: Secondary | ICD-10-CM | POA: Diagnosis not present

## 2019-12-11 DIAGNOSIS — Z20822 Contact with and (suspected) exposure to covid-19: Secondary | ICD-10-CM | POA: Diagnosis not present

## 2019-12-11 DIAGNOSIS — I10 Essential (primary) hypertension: Secondary | ICD-10-CM

## 2019-12-11 DIAGNOSIS — A419 Sepsis, unspecified organism: Secondary | ICD-10-CM | POA: Diagnosis not present

## 2019-12-11 DIAGNOSIS — I480 Paroxysmal atrial fibrillation: Secondary | ICD-10-CM

## 2019-12-11 DIAGNOSIS — Z515 Encounter for palliative care: Secondary | ICD-10-CM

## 2019-12-11 LAB — CBC
HCT: 35.4 % — ABNORMAL LOW (ref 39.0–52.0)
Hemoglobin: 10.7 g/dL — ABNORMAL LOW (ref 13.0–17.0)
MCH: 27.4 pg (ref 26.0–34.0)
MCHC: 30.2 g/dL (ref 30.0–36.0)
MCV: 90.8 fL (ref 80.0–100.0)
Platelets: 176 10*3/uL (ref 150–400)
RBC: 3.9 MIL/uL — ABNORMAL LOW (ref 4.22–5.81)
RDW: 20.5 % — ABNORMAL HIGH (ref 11.5–15.5)
WBC: 7 10*3/uL (ref 4.0–10.5)
nRBC: 0.3 % — ABNORMAL HIGH (ref 0.0–0.2)

## 2019-12-11 LAB — BASIC METABOLIC PANEL
Anion gap: 17 — ABNORMAL HIGH (ref 5–15)
BUN: 94 mg/dL — ABNORMAL HIGH (ref 8–23)
CO2: 21 mmol/L — ABNORMAL LOW (ref 22–32)
Calcium: 8.9 mg/dL (ref 8.9–10.3)
Chloride: 113 mmol/L — ABNORMAL HIGH (ref 98–111)
Creatinine, Ser: 1.99 mg/dL — ABNORMAL HIGH (ref 0.61–1.24)
GFR calc non Af Amer: 31 mL/min — ABNORMAL LOW (ref >60)
Glucose, Bld: 97 mg/dL (ref 70–99)
Potassium: 3.6 mmol/L (ref 3.5–5.1)
Sodium: 151 mmol/L — ABNORMAL HIGH (ref 135–145)

## 2019-12-11 LAB — CORTISOL-AM, BLOOD: Cortisol - AM: 17.6 ug/dL (ref 6.7–22.6)

## 2019-12-11 LAB — PROCALCITONIN: Procalcitonin: 0.1 ng/mL

## 2019-12-11 LAB — CREATININE, SERUM
Creatinine, Ser: 2.01 mg/dL — ABNORMAL HIGH (ref 0.61–1.24)
GFR calc non Af Amer: 31 mL/min — ABNORMAL LOW (ref >60)

## 2019-12-11 LAB — PROTIME-INR
INR: 1.2 (ref 0.8–1.2)
Prothrombin Time: 14.3 seconds (ref 11.4–15.2)

## 2019-12-11 MED ORDER — DOXAZOSIN MESYLATE 2 MG PO TABS: 2.0000 mg | ORAL_TABLET | Freq: Every day | ORAL | Status: DC

## 2019-12-11 MED ORDER — DEXTROSE 5 % IV SOLN: INTRAVENOUS | Status: AC

## 2019-12-11 MED ORDER — JEVITY 1.5 CAL/FIBER PO LIQD
1000.0000 mL | ORAL | Status: DC
  Filled 2019-12-11: qty 1000

## 2019-12-11 MED ORDER — LORAZEPAM 2 MG/ML IJ SOLN: 1.0000 mg | INTRAMUSCULAR | Status: DC | PRN

## 2019-12-11 MED ORDER — LIDOCAINE 5 % EX PTCH
1.0000 | MEDICATED_PATCH | CUTANEOUS | Status: DC
  Administered 2019-12-11: 1 via TRANSDERMAL
  Filled 2019-12-11 (×4): qty 1

## 2019-12-11 MED ORDER — MORPHINE SULFATE (PF) 2 MG/ML IV SOLN
2.0000 mg | INTRAVENOUS | Status: DC | PRN
  Administered 2019-12-11 – 2019-12-14 (×7): 2 mg via INTRAVENOUS
  Filled 2019-12-11 (×7): qty 1

## 2019-12-11 MED ORDER — LACTATED RINGERS IV SOLN: INTRAVENOUS | Status: DC

## 2019-12-11 MED ORDER — FREE WATER
50.0000 mL | Status: DC
  Administered 2019-12-11 – 2019-12-14 (×13): 50 mL

## 2019-12-11 MED ORDER — PROSOURCE TF PO LIQD
45.0000 mL | Freq: Two times a day (BID) | ORAL | Status: DC
  Filled 2019-12-11: qty 45

## 2019-12-11 MED ORDER — PROSOURCE TF PO LIQD
45.0000 mL | Freq: Three times a day (TID) | ORAL | Status: DC
  Filled 2019-12-11: qty 45

## 2019-12-11 NOTE — Progress Notes (Signed)
OT Cancellation Note  Patient Details Name: Stephen Copeland MRN: [id number] DOB: [birth year]   Cancelled Treatment:    Reason Eval/Treat Not Completed: Patient not medically ready. Spoke with RN request therapy hold evaluation until tomorrow, will check back 10/9.  Delbert Phenix OT OT pager: [phone]   Rosemary Holms 12/11/2019, 12:06 PM

## 2019-12-11 NOTE — Evaluation (Signed)
Physical Therapy Evaluation Patient Details Name: Stephen Copeland MRN: [id number] DOB: 08/26/1940 Today's Date: 12/11/2019   History of Present Illness  Pt is a 79 y.o. male PMH of Afib, systolic CHF, HLD, CAD, HTN, prostate cancer, stroke on 07/09/19 and underwent intervention stent placement for stroke (hospital stay complicated with intracranial hemorrhage and respiratory failure requiring intubation secondary to aspiration pneumonia), GIB, and admitted for sepsis and pneumonia  Clinical Impression  Pt admitted with above diagnosis.  Pt currently with functional limitations due to the deficits listed below (see PT Problem List). Pt will benefit from skilled PT to increase their independence and safety with mobility to allow discharge to the venue listed below.  Pt mostly reporting he would just like to stay warm.  Pt states he lives at home with his wife and doesn't get out of bed.  Pt has had 4 inpatient admission in the last 6 months per chart review.  Pt's wife also having difficulty managing pt at home per admission notes.  Pt may benefit from SNF.  Recommend palliative consult for determining goals of care.     Follow Up Recommendations SNF    Equipment Recommendations  None recommended by PT    Recommendations for Other Services       Precautions / Restrictions Precautions Precautions: Fall      Mobility  Bed Mobility Overal bed mobility: Needs Assistance             General bed mobility comments: pt not wanting to assist with moving extremities so deferred bed mobility, assisted pt with attempt to perform midline in bed as pt prefers to trunk lean and turn head to right, placed pillow to float heels  Transfers                    Ambulation/Gait                Stairs            Wheelchair Mobility    Modified Rankin (Stroke Patients Only)       Balance                                             Pertinent Vitals/Pain  Pain Assessment: Faces Faces Pain Scale: No hurt Pain Intervention(s): Repositioned    Home Living Family/patient expects to be discharged to:: Private residence Living Arrangements: Spouse/significant other Available Help at Discharge: Family Type of Home: House Home Access: Level entry     Home Layout: One level Home Equipment: Shower seat;Cane - single point Additional Comments: home environment from chart/prior admission    Prior Function Level of Independence: Needs assistance         Comments: Prior to CVA in May 2021 pt was independent. pt has been non-ambulatory since     Hand Dominance        Extremity/Trunk Assessment        Lower Extremity Assessment Lower Extremity Assessment: Difficult to assess due to impaired cognition;Generalized weakness (pt not assisting with PROM, reports staying in bed at home)       Communication   Communication: Expressive difficulties  Cognition Arousal/Alertness: Awake/alert Behavior During Therapy: Flat affect Overall Cognitive Status: No family/caregiver present to determine baseline cognitive functioning  General Comments: pt able to answer simple questions however kept eyes closed during session      General Comments      Exercises     Assessment/Plan    PT Assessment Patient needs continued PT services  PT Problem List         PT Treatment Interventions DME instruction;Therapeutic exercise;Functional mobility training;Therapeutic activities;Neuromuscular re-education;Patient/family education;Wheelchair mobility training;Balance training    PT Goals (Current goals can be found in the Care Plan section)  Acute Rehab PT Goals PT Goal Formulation: Patient unable to participate in goal setting Time For Goal Achievement: 12/25/19 Potential to Achieve Goals: Fair    Frequency Min 2X/week   Barriers to discharge        Co-evaluation                AM-PAC PT "6 Clicks" Mobility  Outcome Measure Help needed turning from your back to your side while in a flat bed without using bedrails?: Total Help needed moving from lying on your back to sitting on the side of a flat bed without using bedrails?: Total Help needed moving to and from a bed to a chair (including a wheelchair)?: Total Help needed standing up from a chair using your arms (e.g., wheelchair or bedside chair)?: Total Help needed to walk in hospital room?: Total Help needed climbing 3-5 steps with a railing? : Total 6 Click Score: 6    End of Session   Activity Tolerance: Patient limited by fatigue Patient left: in bed;with call bell/phone within reach   PT Visit Diagnosis: Muscle weakness (generalized) (M62.81);Adult, failure to thrive (R62.7)    Time: 8270-7867 PT Time Calculation (min) (ACUTE ONLY): 9 min   Charges:   PT Evaluation $PT Eval Low Complexity: 1 Low        Kati PT, DPT Acute Rehabilitation Services Pager: [phone] Office: [phone]  York Ram E 12/11/2019, 12:42 PM

## 2019-12-11 NOTE — TOC Progression Note (Signed)
Transition of Care [facility]) - Progression Note    Patient Details  Name: Stephen Copeland MRN: [id number] Date of Birth: [birth year]  [facility]) CM/SW Contact  Purcell Mouton, RN Phone Number: 12/11/2019, 11:07 AM  Clinical Narrative:    A call to pt's wife to was made concerning discharge plans. Mrs. Tritschler could not understand what CM was saying. Mrs Hinde asked that pt's sister Ander Slade [phone] be contact person and to call her. A call to Mrs. Adrian Blackwater was made. Mrs. Adrian Blackwater asked that pt go to residential Hospice. MD was made aware.    Expected Discharge Plan: [place] Barriers to Discharge: No Barriers Identified  Expected Discharge Plan and Services Expected Discharge Plan: [place] arrangements for the past 2 months: Single Family Home                                       Social Determinants of Health (SDOH) Interventions    Readmission Risk Interventions No flowsheet data found.

## 2019-12-11 NOTE — TOC Progression Note (Signed)
Transition of Care [facility]) - Progression Note    Patient Details  Name: Stephen Copeland MRN: [id number] Date of Birth: 01-29-1941  [facility]) CM/SW Contact  Purcell Mouton, RN Phone Number: 12/11/2019, 2:54 PM  Clinical Narrative:     Spoke with Stephen Copeland who [facility]. Referral was given to in house rep.   Expected Discharge Plan: [place] Barriers to Discharge: No Barriers Identified  Expected Discharge Plan and Services Expected Discharge Plan: [place] arrangements for the past 2 months: Single Family Home                                       Social Determinants of Health (SDOH) Interventions    Readmission Risk Interventions No flowsheet data found.

## 2019-12-11 NOTE — Progress Notes (Signed)
PROGRESS NOTE  Stephen Copeland SWN:[id number] DOB: 08-04-1940 DOA: 12/10/2019 PCP: Clovia Cuff, MD   LOS: 1 day   Brief narrative: As per HPI,  Stephen Copeland is a 79 y.o. male with medical history significant of stroke, HTN, a fib.   Presented to hospital with cough and shortness of breath, dyspnea for 3 to 4 days prior to presentation.  He reported that he was recently diagnosed with pneumonia but did not take any medication as he was unable to get them.  He continued to have increasing weakness and shortness of breath and EMS was called in. As per the patient's wife his wife, she notes that he's been increasingly weak and never really recovered from his stroke. She is finding it hard to take care of him. ED Course: CXR was obtained that showed right perihilar opacity. His lactic acid was found to be elevated. He was started on sepsis protocol. Vanc, cefepime, flagyl started. TRH called for admission.    Assessment/Plan:  Principal Problem:   Sepsis ([place]) Active Problems:   Paroxysmal atrial fibrillation (H[facility])   Hypertension   Hyperlipidemia   Chronic systolic CHF (congestive heart failure) ([place])   Pneumonia  Sepsis likely secondary to pneumonia.  Patient met sepsis criteria on presentation with tachycardia, tachypnea and source of infection being pneumonia with acute kidney injury.  Received vancomycin and cefepime in the ED. Lactate was elevated on presentation at 2.7.  This has improved with IV fluid bolus.  COVID-19 was negative.,  Influenza was negative.  Acute kidney injury.  Creatinine on admission was 2.3.  Baseline creatinine of 1.1.  Received septic fluid bolus.   Monitor renal function closely.  Check BMP from today.  Hypernatremia likely secondary to dehydration Continue IV fluid hydration for now.  Sodium of 151 on presentation.  Patient is severely dehydrated.  Received Ringer lactate initially.  Will change to D5 water and water flushes through the PEG tube.  Severe  hypokalemia of 2.9 on presentation.  Improved to 3.6.  Will closely monitor.  Normocytic anemia Continue to monitor with CBC.  Paroxysmal Afib, not on anticoagulation   Rate controlled at this time.  Metoprolol on board.  Hx of HTN    Metoprolol has been resumed.  Hx of BPH On doxazosin at home.  Will change to once a day at night only  Chronic HFrEF, Dilated Cardiomyopathy Beta-blockers have been resumed.  Currently on IV fluids for severe hypernatremia.  Check intake and output charting.  Patient did have 2D echocardiogram 07/10/2019 showing ejection fraction of 35%.   Hx of CVA w/ residuals,Dysphagia,Dysarthria PEG tube in place.  Will consider free water through the PEG tube.  Goals of care Palliative care has been consulted.  Debility, deconditioning     - PT/OT consult. spouse having increasing difficulty with care at home  Sacral deep tissue injury     -Present on admission.  Wound care has been consulted.  DVT prophylaxis: heparin injection 5,000 Units Start: 12/10/19 2200  Code Status: DNR  Family Communication: I spoke with the patient's sister on the phone about the goals of care.  She is going to touch base with the patient's wife and get back to [place].  Social worker notified that the family was interested in residential hospice.  Status is: Inpatient  Remains inpatient appropriate because:Unsafe d/c plan, IV treatments appropriate due to intensity of illness or inability to take PO and Inpatient level of care appropriate due to severity of illness   Dispo: The patient  is from: Home              Anticipated d/c is to: Undetermined at this time, residential hospice likely              Anticipated d/c date is: 2 days              Patient currently is not medically stable to d/c.  Consultants:  None  Procedures:  None  Antibiotics:  . Vancomycin and cefepime  Anti-infectives (From admission, onward)   Start     Dose/Rate Route Frequency Ordered  Stop   12/11/19 1400  ceFEPIme (MAXIPIME) 2 g in sodium chloride 0.9 % 100 mL IVPB        2 g 200 mL/hr over 30 Minutes Intravenous Every 24 hours 12/10/19 1905     12/11/19 1000  vancomycin (VANCOCIN) IVPB 1000 mg/200 mL premix        1,000 mg 200 mL/hr over 60 Minutes Intravenous Every 36 hours 12/10/19 1905     12/10/19 1330  ceFEPIme (MAXIPIME) 2 g in sodium chloride 0.9 % 100 mL IVPB        2 g 200 mL/hr over 30 Minutes Intravenous  Once 12/10/19 1328 12/10/19 1523   12/10/19 1330  metroNIDAZOLE (FLAGYL) IVPB 500 mg        500 mg 100 mL/hr over 60 Minutes Intravenous  Once 12/10/19 1328 12/10/19 1557   12/10/19 1330  vancomycin (VANCOCIN) IVPB 1000 mg/200 mL premix        1,000 mg 200 mL/hr over 60 Minutes Intravenous  Once 12/10/19 1328 12/10/19 1641     Subjective:  Today, patient was seen and examined at bedside.  Patient is mildly communicative, complains of shortness of breath and dyspnea.  Nursing staff reported episodes of bradycardia and dyspnea.  Objective: Vitals:   12/10/19 2210 12/11/19 0701  BP: 112/88 118/74  Pulse: 95 90  Resp: 20 (!) 22  Temp: (!) 97 F (36.1 C) 97.9 F (36.6 C)  SpO2: 100% 94%    Intake/Output Summary (Last 24 hours) at 12/11/2019 0811 Last data filed at 12/11/2019 0300 Gross per 24 hour  Intake 4064.43 ml  Output --  Net 4064.43 ml   There were no vitals filed for this visit. There is no height or weight on file to calculate BMI.   Physical Exam:  GENERAL: Patient is alert awake and mildly communicative. Not in obvious distress.  Nasal cannula oxygen HENT: Mild pallor noted pupils equally reactive to light. Oral mucosa is extremely dry NECK: is supple, no gross swelling noted. CHEST: Coarse breath sounds noted.  Breath sounds bilaterally. CVS: S1 and S2 heard, no murmur.  Irregular rhythm. ABDOMEN: Soft, non-tender, bowel sounds are present.  Sacral DTI.  PEG tube in place. EXTREMITIES: No edema.  CNS: Dysarthria noted, alert  awake and communicative. SKIN: warm and dry, sacral DTI, decreased skin turgor  Data Review: I have personally reviewed the following laboratory data and studies,  CBC: Recent Labs  Lab 12/10/19 1328 12/10/19 2100 12/11/19 0440  WBC 9.4 8.1 7.0  NEUTROABS 6.9  --   --   HGB 9.8* 11.3* 10.7*  HCT 31.7* 35.0* 35.4*  MCV 90.3 86.8 90.8  PLT 324 193 448   Basic Metabolic Panel: Recent Labs  Lab 12/10/19 1328 12/11/19 0440  NA 151*  --   K 2.9*  --   CL 113*  --   CO2 23  --   GLUCOSE 124*  --  BUN 126*  --   CREATININE 2.43* 2.01*  CALCIUM 9.6  --   MG 2.4  --    Liver Function Tests: Recent Labs  Lab 12/10/19 1328  AST 23  ALT 25  ALKPHOS 91  BILITOT 1.5*  PROT 7.6  ALBUMIN 3.3*   No results for input(s): LIPASE, AMYLASE in the last 168 hours. No results for input(s): AMMONIA in the last 168 hours. Cardiac Enzymes: No results for input(s): CKTOTAL, CKMB, CKMBINDEX, TROPONINI in the last 168 hours. BNP (last 3 results) Recent Labs    09/08/19 2155  BNP 161.5*    ProBNP (last 3 results) No results for input(s): PROBNP in the last 8760 hours.  CBG: No results for input(s): GLUCAP in the last 168 hours. Recent Results (from the past 240 hour(s))  Blood culture (routine single)     Status: None (Preliminary result)   Collection Time: 12/10/19  1:28 PM   Specimen: BLOOD  Result Value Ref Range Status   Specimen Description   Final    BLOOD RIGHT ANTECUBITAL Performed at [facility] [address], [place], [place] [address]    Special Requests   Final    BOTTLES DRAWN AEROBIC AND ANAEROBIC Blood Culture results may not be optimal due to an inadequate volume of blood received in culture bottles Performed at [facility] [address] [place], [place] [address]    Culture   Final    NO GROWTH < 24 HOURS Performed at [facility] [address], [place], [place] [address]    Report Status PENDING   Incomplete  Respiratory Panel by RT PCR (Flu A&B, Covid) - Nasopharyngeal Swab     Status: None   Collection Time: 12/10/19  3:58 PM   Specimen: Nasopharyngeal Swab  Result Value Ref Range Status   SARS Coronavirus 2 by RT PCR NEGATIVE NEGATIVE Final    Comment: (NOTE) SARS-CoV-2 target nucleic acids are NOT DETECTED.  The SARS-CoV-2 RNA is generally detectable in upper respiratoy specimens during the acute phase of infection. The lowest concentration of SARS-CoV-2 viral copies this assay can detect is 131 copies/mL. A negative result does not preclude SARS-Cov-2 infection and should not be used as the sole basis for treatment or other patient management decisions. A negative result may occur with  improper specimen collection/handling, submission of specimen other than nasopharyngeal swab, presence of viral mutation(s) within the areas targeted by this assay, and inadequate number of viral copies (<131 copies/mL). A negative result must be combined with clinical observations, patient history, and epidemiological information. The expected result is Negative.  Fact Sheet for Patients:  PinkCheek.be  Fact Sheet for Healthcare Providers:  GravelBags.it  This test is no t yet approved or cleared by the [place] FDA and  has been authorized for detection and/or diagnosis of SARS-CoV-2 by FDA under an Emergency Use Authorization (EUA). This EUA will remain  in effect (meaning this test can be used) for the duration of the COVID-19 declaration under Section 564(b)(1) of the Act, 21 U.S.C. section 360bbb-3(b)(1), unless the authorization is terminated or revoked sooner.     Influenza A by PCR NEGATIVE NEGATIVE Final   Influenza B by PCR NEGATIVE NEGATIVE Final    Comment: (NOTE) The Xpert Xpress SARS-CoV-2/FLU/RSV assay is intended as an aid in  the diagnosis of influenza from Nasopharyngeal swab specimens and  should not  be used as a sole basis for treatment. Nasal washings and  aspirates are  unacceptable for Xpert Xpress SARS-CoV-2/FLU/RSV  testing.  Fact Sheet for Patients: PinkCheek.be  Fact Sheet for Healthcare Providers: GravelBags.it  This test is not yet approved or cleared by the [place] FDA and  has been authorized for detection and/or diagnosis of SARS-CoV-2 by  FDA under an Emergency Use Authorization (EUA). This EUA will remain  in effect (meaning this test can be used) for the duration of the  Covid-19 declaration under Section 564(b)(1) of the Act, 21  U.S.C. section 360bbb-3(b)(1), unless the authorization is  terminated or revoked. Performed at [facility], [place] [address], [place], [place] [address]      Studies: US RENAL  Result Date: 12/10/2019 CLINICAL DATA:  Acute kidney injury EXAM: RENAL / URINARY TRACT ULTRASOUND COMPLETE COMPARISON:  None. FINDINGS: Right Kidney: Renal measurements: 10 x 5.5 x 4.2 cm = volume: 121 mL. There is no definite hydronephrosis. There is apparent increased cortical echogenicity. Left Kidney: Renal measurements: 10 x 6.5 x 4.1 cm = volume: 139 mL mL. There is increased cortical echogenicity without evidence for hydronephrosis. Bladder: Appears normal for degree of bladder distention. Other: Prostate gland is enlarged measuring approximately 34 mL in volume. There is prominent median lobe hypertrophy. IMPRESSION: 1. Exam limited by patient body habitus. 2. Echogenic kidneys bilaterally which can be seen in patients with medical renal disease. No hydronephrosis. Electronically Signed   By: Constance Holster M.D.   On: 12/10/2019 19:19   DG Chest Port 1 View  Result Date: 12/10/2019 CLINICAL DATA:  Pneumonia EXAM: PORTABLE CHEST 1 VIEW COMPARISON:  09/08/2019 FINDINGS: Stable cardiomediastinal contours. Atherosclerotic calcification of the aortic knob. Streaky right perihilar  opacity. Chronic mild interstitial prominence within the left lung base. No pleural effusion or pneumothorax. IMPRESSION: Streaky right perihilar opacity may represent atelectasis or developing infiltrate. Electronically Signed   By: Davina Poke D.O.   On: 12/10/2019 13:37      Flora Lipps, MD  [facility] 12/11/2019

## 2019-12-11 NOTE — Progress Notes (Addendum)
Patient with stroke with significant pneumonia and severe hypernatremia with declining functional and physical status at home.  Poor prognosis at this time.  I had a prolonged discussion with the patient's sister Ms. Stephen Copeland on the phone at 2 separate occasions.  She has spoken with the patient's wife regarding the goals of care.  At this time Ms. Stephen Copeland and the patient wife have agreed on focusing on comfort care rather than aggressive treatment.  Goals of comfort care was explained to Ms. Stephen Copeland at length and all the queries were answered.  At this time, we will focus on comfort care.  Residential hospice consult is underway.  Patient would benefit from residential hospice level of care to address his ongoing needs and focus on comfort.

## 2019-12-11 NOTE — Consult Note (Signed)
Palliative Care Consult Note  Reason for visit: Goals of care in light of likely aspiration pneumonia with prior stroke and prior PEG tube placement  Palliative care consult received.  Chart reviewed including personal review of pertinent labs and imaging.  Discussed with case management who has been in contact with family.  Initial contact was with his wife, but she has asked [place] to speak with his sister, Bethena Roys.  Briefly, Stephen Copeland is a 79 year old male with PMHx stroke, hypertension, A. fib who presented with increasing cough, dyspnea, and weakness.  He reports that he was diagnosed with pneumonia but had been unable to get antibiotics that were ordered by physician.  His wife cares for him at home but is reported to have been overwhelming called EMS to bring him to the hospital.  He has been increasingly weak and never really recovered since suffering from stroke.  He has dysphagia and currently receives foods via PEG tube.  Following admission, his wife is deferred conversation to his sister who expressed preference for comfort care moving forward.  Palliative consulted for goals of care.  I met today with Stephen Copeland in conjunction with his Physiological scientist.  We discussed difference between a aggressive medical intervention path and a palliative, comfort focused care path.  Values and  goals of care important to patient and family were attempted to be elicited.  He reports that his primary concern moving forward is that his wife is cared for.  He states that it has become too burdensome for her to care for him at home.  We discussed that, realistically, his options moving forward would be for more aggressive care plan versus focusing care on comfort understanding that time would be short.    We talked about possibility of pursuing aggressive medical interventions which may get him over this current pneumonia, but it would not change the fact that he is going to continue to aspirate and have recurrent  pneumonias moving forward.  He reports that this has been his experience in the past but his wife has been able to care for him at home up to this point.  He understands that he is weaker now and his wife is unable to care for him at home.  We discussed concern that this may lead to long-term care facility with recurrent admissions for aspiration in the future.  He is opposed to plan for transition to skilled facility for long-term care.  He states that he just wants to be someplace that he can be cared for and that people pay attention to him.  We also discussed option for focusing only on things for his comfort which would be foregoing further curative interventions such as antibiotics, tube feeds, and fluids and focusing only on aggressive symptom management for pain, shortness of breath, anxiety, agitation, or nausea.  Discussed that time would likely be short with his prognosis being days to weeks at best as he will quickly get septic again from underlying pneumonia.  We discussed residential hospice is being a pathway forward to be cared for in a compassionate way as he approaches end-of-life, particularly as his symptoms increase as he is starting to report shortness of breath at time of my encounter.  We tried very hard to enable him to speak freely to express his concerns about either pathway.  Following long discussion, he reports that he thinks focus on comfort moving forward lines up best with the things that are most important to him.  He  is in agreement with plan to pursue residential hospice for end-of-life care.  - DNR/DNI - Focus of care moving forward is comfort care. - He is in agreement with plan for working to transition to residential hospice for end of life care. - While he is currently awake and interactive, he will quickly become septic with focus on comfort and stopping antibiotics.  He does report feeling short of breath and being very cold.  Plan to aggressively manage the  symptoms as best possible.  Overall, I think that he is going to continue to have worsening symptoms as pneumonia progresses and I believe his prognosis is limited to days to week with a focus on comfort care.  I believe he would best be served by transition to residential hospice for end-of-life care if it can be arranged. - Discussed with liaison from [facility].  They are planning to follow-up with Stephen Copeland and his family tomorrow after I have a chance to discuss plan further with his wife. - I was able to reach his wife this afternoon.  We discussed my conversation with him earlier today and she is in agreement with plan for transition to residential hospice as this is what he desires.  Start time: 1440 End time: 1555 Total time: 75 minutes

## 2019-12-11 NOTE — Progress Notes (Signed)
CCMD notified RN that patient was having multiple 2 sec. pauses on the monitor with HR briefly sustaining in the 30s-40s. Pt complains of SOB, stating that he can not catch his breath. EKG complete. Vital signs taken. BP 94/67, 02 99% on 2L of 02, RR 20, Temp 96.5 rectally. MD notified. Orders given to place patient on bear hugger blanket warmer. See new orders. Will continue to monitor.

## 2019-12-11 NOTE — Progress Notes (Signed)
Initial Nutrition Assessment  DOCUMENTATION CODES:   Not applicable  INTERVENTION:   If TF to continue per GOC: -Initiate Jevity 1.5 @ 20 ml/hr via PEG. Advance by 10 ml every 6 hours to goal rate of 50 ml/hr. -45 ml Prosource TF  TID, each provides 40 kcals and 11g protein -Regimen provides 1920 kcals, 109g protein and 912 ml H2O.  -Free water flushes per MD, currently 50 ml every 4 hours (300 ml)  NUTRITION DIAGNOSIS:   Increased nutrient needs related to acute illness (sepsis, pneumonia) as evidenced by estimated needs.  GOAL:   Patient will meet greater than or equal to 90% of their needs  MONITOR:   TF tolerance, Labs, Weight trends, I & O's, Skin  REASON FOR ASSESSMENT:   Consult Assessment of nutrition requirement/status, Enteral/tube feeding initiation and management  ASSESSMENT:   79 y.o. male with medical history significant of stroke, HTN, a fib. Presents with 3 - 4 days of increasing cough and dyspnea. He reports that he's had increasing cough over the last several days. He reports he was diagnosed with pneumonia, but did not take any medicines because he was unable to get them. Admitted for sepsis.  Spoke with RN regarding plan for pt. Received consult for TF initiation yesterday but per chart this morning, pt's family may be considering residential hospice at discharge. Per RN, pt still awaiting meeting with Palliative care.   RD to place TF orders but can be started once plan is more established. Relayed plan to RN.  Per chart:  Patient has been using 30 ml Prostat five times daily. No Jevity as was recommended in previous admission.   Will restart Jevity and advance up to goal as pt is unable to meet needs with 5 Prostats daily (provides 500 kcals and 75g protein).  Per weight records, last recorded weight is from July 2021. Once staff able, recommend obtaining weight measurement this admission.  Medications: D5 infusion  Labs reviewed: Elevated  Na  NUTRITION - FOCUSED PHYSICAL EXAM:  Unable to complete  Diet Order:   Diet Order            Diet NPO time specified  Diet effective now                 EDUCATION NEEDS:   No education needs have been identified at this time  Skin:  Skin Assessment: Skin Integrity Issues: Skin Integrity Issues:: DTI DTI: mid sacrum  Last BM:  PTA  Height:   Ht Readings from Last 1 Encounters:  09/09/19 6\' 2"  (1.88 m)    Weight:   Wt Readings from Last 1 Encounters:  09/09/19 70.8 kg    BMI:  There is no height or weight on file to calculate BMI.  Estimated Nutritional Needs:   Kcal:  2100-2300  Protein:  105-115g  Fluid:  2.1L/day  Clayton Bibles, MS, RD, LDN Inpatient Clinical Dietitian Contact information available via Amion

## 2019-12-11 NOTE — Consult Note (Signed)
WOC Nurse Consult Note: Reason for Consult: Ecchymosis vs area of DTPI noted on admission Wound type: pressure vs trauma Pressure Injury POA: Yes Measurement:1.5cm x 5cm area of purple/maroon/green discoloration to sacrum. Patient reports fall, but also lies in the supine position and does not self re-position Wound bed:N/A Drainage (amount, consistency, odor) N/A Periwound:intact, dry Dressing procedure/placement/frequency: A mattress replacement is [provided today to mitigate microclimate (patient is incontinent of urine and is wearing an external male catheter) and is it presumed incontinent of stool. He prefers lying in the supine position with HOB elevated-putting the sacrum at risk. As noted above, he reports a fall, but he is unable to elaborate.  I will provide Nursing with guidance for topical care using a silicone foam dressing. A pressure redistribution chair cushion is provided for times when patient is OOB in the chair and also bilateral pressure redistribution heel boots (his heels are currently floated on a pillow, but this is not a long-term strategy for pressure injury prevention).  [facility] nursing team will not follow, but will remain available to this patient, the nursing and medical teams.  Please re-consult if needed. Thanks, Maudie Flakes, MSN, RN, [place], Arther Abbott  Pager# [phone]

## 2019-12-12 DIAGNOSIS — I5022 Chronic systolic (congestive) heart failure: Secondary | ICD-10-CM | POA: Diagnosis not present

## 2019-12-12 DIAGNOSIS — A419 Sepsis, unspecified organism: Secondary | ICD-10-CM | POA: Diagnosis not present

## 2019-12-12 DIAGNOSIS — I10 Essential (primary) hypertension: Secondary | ICD-10-CM | POA: Diagnosis not present

## 2019-12-12 DIAGNOSIS — E7800 Pure hypercholesterolemia, unspecified: Secondary | ICD-10-CM | POA: Diagnosis not present

## 2019-12-12 DIAGNOSIS — Z7189 Other specified counseling: Secondary | ICD-10-CM | POA: Diagnosis not present

## 2019-12-12 DIAGNOSIS — N179 Acute kidney failure, unspecified: Secondary | ICD-10-CM | POA: Diagnosis not present

## 2019-12-12 DIAGNOSIS — J189 Pneumonia, unspecified organism: Secondary | ICD-10-CM | POA: Diagnosis not present

## 2019-12-12 LAB — URINE CULTURE

## 2019-12-12 NOTE — TOC Progression Note (Signed)
Transition of Care [facility]) - Progression Note    Patient Details  Name: Stephen Copeland MRN: [id number] Date of Birth: [birth year]  Transition of Care [facility]) CM/SW Contact  Ross Ludwig, [place] Phone Number: 12/12/2019, 11:40 AM  Clinical Narrative:     Patient does not have a bed available at [facility] or [facility].  CSW spoke to patient's sister Bethena Roys, and she said whichever facility has a bed available first will be the one she chooses.  CSW to continue to follow patient's progress throughout discharge planning.   Expected Discharge Plan: [place] Barriers to Discharge: No Barriers Identified  Expected Discharge Plan and Services Expected Discharge Plan: [place] arrangements for the past 2 months: Single Family Home                                       Social Determinants of Health (SDOH) Interventions    Readmission Risk Interventions No flowsheet data found.

## 2019-12-12 NOTE — Progress Notes (Signed)
PROGRESS NOTE  Stephen Copeland YJE:[id number] DOB: 1940-09-01 DOA: 12/10/2019 PCP: Clovia Cuff, MD   LOS: 2 days   Brief narrative: As per HPI,  Stephen Copeland is a 79 y.o. male with medical history significant of stroke, HTN, a fib.   Presented to hospital with cough and shortness of breath, dyspnea for 3 to 4 days prior to presentation.  He reported that he was recently diagnosed with pneumonia but did not take any medication as he was unable to get them.  He continued to have increasing weakness and shortness of breath and EMS was called in. As per the patient's wife his wife, she notes that he's been increasingly weak and never really recovered from his stroke. She is finding it hard to take care of him. ED Course: CXR was obtained that showed right perihilar opacity. His lactic acid was found to be elevated. He was started on sepsis protocol. Vanc, cefepime, flagyl started. TRH called for admission.    Assessment/Plan:  Principal Problem:   Sepsis ([place]) Active Problems:   Paroxysmal atrial fibrillation (HCC)   Hypertension   Hyperlipidemia   Chronic systolic CHF (congestive heart failure) ([place])   Pneumonia  Sepsis likely secondary to pneumonia.  Patient met sepsis criteria on presentation with tachycardia, tachypnea and source of infection being pneumonia with acute kidney injury.  Received vancomycin and cefepime in the ED. Lactate was elevated on presentation at 2.7.   COVID-19 was negative.,  Influenza was negative.  At this time comfort care has been initiated.  Acute kidney injury.  Creatinine on admission was 2.3.  Baseline creatinine of 1.1.  Received septic fluid bolus.  No further labs.  Comfort care has been initiated..  Hypernatremia likely secondary to dehydration   Sodium of 151 on presentation.  Patient is severely dehydrated.  Received Ringer lactate initially.  Currently on D5 water and water flushes and on comfort.  Severe hypokalemia of 2.9 on presentation.   Improved to 3.6.  No further labs.  Focus on comfort.  Normocytic anemia  Paroxysmal Afib, not on anticoagulation Focus on comfort.  Hx of HTN Comfort measures only.  Hx of BPH Continue doxazosin.  Chronic HFrEF, Dilated Cardiomyopathy No acute heart failure at this time.  Focus on comfort.  Patient did have 2D echocardiogram 07/10/2019 showing ejection fraction of 35%.   Hx of CVA w/ residuals,Dysphagia,Dysarthria PEG tube in place.  Free water flushes through the PEG tube.  Goals of care Palliative care on board.  Debility, deconditioning    Sacral deep tissue injury     -Present on admission.    DVT prophylaxis: None for comfort.  Code Status: DNR  Family Communication:  None today.  Had a prolonged discussion with the family members yesterday.  Comfort care has been initiated.  Patient would benefit from residential hospice on discharge.  Status is: Inpatient  Remains inpatient appropriate because:Unsafe d/c plan, IV treatments appropriate due to intensity of illness or inability to take PO and Inpatient level of care appropriate due to severity of illness, comfort care has been initiated   Dispo: The patient is from: Home              Anticipated d/c is to: Undetermined at this time, residential hospice consultation underway.              Anticipated d/c date is: 2 days              Patient currently is not medically stable to d/c.  Consultants:  Palliative care  Procedures:  None   Antibiotics:  . none  Anti-infectives (From admission, onward)   Start     Dose/Rate Route Frequency Ordered Stop   12/11/19 1400  ceFEPIme (MAXIPIME) 2 g in sodium chloride 0.9 % 100 mL IVPB  Status:  Discontinued        2 g 200 mL/hr over 30 Minutes Intravenous Every 24 hours 12/10/19 1905 12/11/19 1425   12/11/19 1000  vancomycin (VANCOCIN) IVPB 1000 mg/200 mL premix  Status:  Discontinued        1,000 mg 200 mL/hr over 60 Minutes Intravenous Every 36 hours 12/10/19  1905 12/11/19 1422   12/10/19 1330  ceFEPIme (MAXIPIME) 2 g in sodium chloride 0.9 % 100 mL IVPB        2 g 200 mL/hr over 30 Minutes Intravenous  Once 12/10/19 1328 12/10/19 1523   12/10/19 1330  metroNIDAZOLE (FLAGYL) IVPB 500 mg        500 mg 100 mL/hr over 60 Minutes Intravenous  Once 12/10/19 1328 12/10/19 1557   12/10/19 1330  vancomycin (VANCOCIN) IVPB 1000 mg/200 mL premix        1,000 mg 200 mL/hr over 60 Minutes Intravenous  Once 12/10/19 1328 12/10/19 1641     Subjective: Today, patient was seen and examined at bedside. Complains of mild left knee pain on movement. Awake on verbal command and denies any dyspnea chest pain.   Objective: Vitals:   12/11/19 1344 12/12/19 0649  BP:  92/67  Pulse:  87  Resp:    Temp: (!) 96.5 F (35.8 C)   SpO2:  94%    Intake/Output Summary (Last 24 hours) at 12/12/2019 0820 Last data filed at 12/12/2019 0534 Gross per 24 hour  Intake 1220.55 ml  Output 225 ml  Net 995.55 ml   There were no vitals filed for this visit. There is no height or weight on file to calculate BMI.   Physical Exam:  GENERAL: Patient is somnolent but easy to arouse, mildly communicative not in obvious distress.  Nasal cannula oxygen.  Elderly male, deconditioned HENT: Mild pallor noted pupils equally reactive to light. Oral mucosa is dry. NECK: is supple, no gross swelling noted. CHEST: Coarse breath sounds noted.  Breath sounds bilaterally. CVS: S1 and S2 heard, no murmur.  Irregular rhythm. ABDOMEN: Soft, non-tender, bowel sounds are present.  Sacral DTI.  PEG tube in place. EXTREMITIES: No edema.  CNS: Dysarthria noted, mildly somnolent able to communicate and answer few questions. SKIN: warm and dry, sacral DTI  Data Review: I have personally reviewed the following laboratory data and studies,  CBC: Recent Labs  Lab 12/10/19 1328 12/10/19 2100 12/11/19 0440  WBC 9.4 8.1 7.0  NEUTROABS 6.9  --   --   HGB 9.8* 11.3* 10.7*  HCT 31.7* 35.0*  35.4*  MCV 90.3 86.8 90.8  PLT 324 193 309   Basic Metabolic Panel: Recent Labs  Lab 12/10/19 1328 12/11/19 0434 12/11/19 0440  NA 151* 151*  --   K 2.9* 3.6  --   CL 113* 113*  --   CO2 23 21*  --   GLUCOSE 124* 97  --   BUN 126* 94*  --   CREATININE 2.43* 1.99* 2.01*  CALCIUM 9.6 8.9  --   MG 2.4  --   --    Liver Function Tests: Recent Labs  Lab 12/10/19 1328  AST 23  ALT 25  ALKPHOS 91  BILITOT 1.5*  PROT 7.6  ALBUMIN 3.3*   No results for input(s): LIPASE, AMYLASE in the last 168 hours. No results for input(s): AMMONIA in the last 168 hours. Cardiac Enzymes: No results for input(s): CKTOTAL, CKMB, CKMBINDEX, TROPONINI in the last 168 hours. BNP (last 3 results) Recent Labs    09/08/19 2155  BNP 161.5*    ProBNP (last 3 results) No results for input(s): PROBNP in the last 8760 hours.  CBG: No results for input(s): GLUCAP in the last 168 hours. Recent Results (from the past 240 hour(s))  Blood culture (routine single)     Status: None (Preliminary result)   Collection Time: 12/10/19  1:28 PM   Specimen: BLOOD  Result Value Ref Range Status   Specimen Description   Final    BLOOD RIGHT ANTECUBITAL Performed at [facility] [address], [place], [place] [address]    Special Requests   Final    BOTTLES DRAWN AEROBIC AND ANAEROBIC Blood Culture results may not be optimal due to an inadequate volume of blood received in culture bottles Performed at [facility] [address], [place], [place] [address]    Culture   Final    NO GROWTH 2 DAYS Performed at [facility] [address] [place], [place] [address]    Report Status PENDING  Incomplete  Respiratory Panel by RT PCR (Flu A&B, Covid) - Nasopharyngeal Swab     Status: None   Collection Time: 12/10/19  3:58 PM   Specimen: Nasopharyngeal Swab  Result Value Ref Range Status   SARS Coronavirus 2 by RT PCR NEGATIVE NEGATIVE Final    Comment:  (NOTE) SARS-CoV-2 target nucleic acids are NOT DETECTED.  The SARS-CoV-2 RNA is generally detectable in upper respiratoy specimens during the acute phase of infection. The lowest concentration of SARS-CoV-2 viral copies this assay can detect is 131 copies/mL. A negative result does not preclude SARS-Cov-2 infection and should not be used as the sole basis for treatment or other patient management decisions. A negative result may occur with  improper specimen collection/handling, submission of specimen other than nasopharyngeal swab, presence of viral mutation(s) within the areas targeted by this assay, and inadequate number of viral copies (<131 copies/mL). A negative result must be combined with clinical observations, patient history, and epidemiological information. The expected result is Negative.  Fact Sheet for Patients:  PinkCheek.be  Fact Sheet for Healthcare Providers:  GravelBags.it  This test is no t yet approved or cleared by the [place] FDA and  has been authorized for detection and/or diagnosis of SARS-CoV-2 by FDA under an Emergency Use Authorization (EUA). This EUA will remain  in effect (meaning this test can be used) for the duration of the COVID-19 declaration under Section 564(b)(1) of the Act, 21 U.S.C. section 360bbb-3(b)(1), unless the authorization is terminated or revoked sooner.     Influenza A by PCR NEGATIVE NEGATIVE Final   Influenza B by PCR NEGATIVE NEGATIVE Final    Comment: (NOTE) The Xpert Xpress SARS-CoV-2/FLU/RSV assay is intended as an aid in  the diagnosis of influenza from Nasopharyngeal swab specimens and  should not be used as a sole basis for treatment. Nasal washings and  aspirates are unacceptable for Xpert Xpress SARS-CoV-2/FLU/RSV  testing.  Fact Sheet for Patients: PinkCheek.be  Fact Sheet for Healthcare  Providers: GravelBags.it  This test is not yet approved or cleared by the [place] FDA and  has been authorized for detection and/or diagnosis of SARS-CoV-2 by  FDA under  an Emergency Use Authorization (EUA). This EUA will remain  in effect (meaning this test can be used) for the duration of the  Covid-19 declaration under Section 564(b)(1) of the Act, 21  U.S.C. section 360bbb-3(b)(1), unless the authorization is  terminated or revoked. Performed at [facility], [place] [address], [place], [place] [address]      Studies: US RENAL  Result Date: 12/10/2019 CLINICAL DATA:  Acute kidney injury EXAM: RENAL / URINARY TRACT ULTRASOUND COMPLETE COMPARISON:  None. FINDINGS: Right Kidney: Renal measurements: 10 x 5.5 x 4.2 cm = volume: 121 mL. There is no definite hydronephrosis. There is apparent increased cortical echogenicity. Left Kidney: Renal measurements: 10 x 6.5 x 4.1 cm = volume: 139 mL mL. There is increased cortical echogenicity without evidence for hydronephrosis. Bladder: Appears normal for degree of bladder distention. Other: Prostate gland is enlarged measuring approximately 34 mL in volume. There is prominent median lobe hypertrophy. IMPRESSION: 1. Exam limited by patient body habitus. 2. Echogenic kidneys bilaterally which can be seen in patients with medical renal disease. No hydronephrosis. Electronically Signed   By: Constance Holster M.D.   On: 12/10/2019 19:19   DG Chest Port 1 View  Result Date: 12/10/2019 CLINICAL DATA:  Pneumonia EXAM: PORTABLE CHEST 1 VIEW COMPARISON:  09/08/2019 FINDINGS: Stable cardiomediastinal contours. Atherosclerotic calcification of the aortic knob. Streaky right perihilar opacity. Chronic mild interstitial prominence within the left lung base. No pleural effusion or pneumothorax. IMPRESSION: Streaky right perihilar opacity may represent atelectasis or developing infiltrate. Electronically Signed   By:  Davina Poke D.O.   On: 12/10/2019 13:37      Flora Lipps, MD  [facility] 12/12/2019

## 2019-12-12 NOTE — Progress Notes (Signed)
Manufacturing engineer [facility]) Hospital Liaison note.    Received request from [facility] for family interest in [facility]. [facility] is unable to offer a room today. Hospital Liaison will follow up tomorrow or sooner if a room becomes available.    Please do not hesitate to call with questions. Thank you for the opportunity to participate in this patient's care.  Chrislyn Edison Pace, BSN, RN [facility] Liaison (listed on [facility] under Hospice/[facility])    [phone] (24h on call)

## 2019-12-12 NOTE — Progress Notes (Signed)
Daily Progress Note   Patient Name: Stephen Copeland       Date: 12/12/2019 DOB: 12/23/1940  Age: 79 y.o. MRN#: [id number] Attending Physician: Flora Lipps, MD Primary Care Physician: Clovia Cuff, MD Admit Date: 12/10/2019  Reason for Consultation/Follow-up: Establishing goals of care, Non pain symptom management and Pain control  Subjective: I saw and examined Mr. Meline today.  He was sleeping and did not arouse with gentle verbal or tactile stimulation.  He had received morphine for pain shortly before my encounter.  Length of Stay: 2  Current Medications: Scheduled Meds:  . free water  50 mL Per Tube Q4H  . lidocaine  1 patch Transdermal Q24H    Continuous Infusions:   PRN Meds: acetaminophen **OR** acetaminophen, LORazepam, morphine injection  Physical Exam       General: Sleeping Heart: Regular rate and rhythm. No murmur appreciated. Lungs: Decreased air movement, coarse  Abdomen: Soft, nondistended, positive bowel sounds.  Ext: No significant edema Skin: Warm and dry    Vital Signs: BP 133/76 (BP Location: Left Arm)   Pulse 79   Temp 97.8 F (36.6 C)   Resp 14   SpO2 93%  SpO2: SpO2: 93 % O2 Device: O2 Device: Room Air O2 Flow Rate: O2 Flow Rate (L/min): 2 L/min  Intake/output summary:   Intake/Output Summary (Last 24 hours) at 12/12/2019 1418 Last data filed at 12/12/2019 0534 Gross per 24 hour  Intake 910.28 ml  Output 225 ml  Net 685.28 ml   LBM: Last BM Date:  (unknown) Baseline Weight:   Most recent weight:         Palliative Assessment/Data:      Patient Active Problem List   Diagnosis Date Noted  . Pneumonia 12/11/2019  . Sepsis ([place]) 12/10/2019  . At high risk for aspiration 09/09/2019  . Atrial fibrillation ([place]) 09/09/2019  .  Hematochezia   . Lower GI bleed 09/02/2019  . SAH (subarachnoid hemorrhage) (HCC) s/p clot retrieval and stent placement 08/06/2019  . Aspiration pneumonia ([place]), likely 08/06/2019  . 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 L BBB (bundle branch block) 08/06/2019  . Prolonged Q-T interval on ECG 08/06/2019  . 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 Frequent loose stools 08/06/2019  . Acute embolic stroke ([place])   .  SBO (small bowel obstruction) ([place])   . History of prostate cancer   . Stage 3 chronic kidney disease ([place])   . Agitation   . Supplemental oxygen dependent   . 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 Acute hypoxemic respiratory failure ([place])   . Acute ischemic stroke ([place]) -R MCA d/t R ICA and M2 occlusion s/p clot retrieval and R ICA stent 07/09/2019  . Vasovagal syncope 07/18/2017  . S/P TURP 07/18/2017  . Coronary artery disease 07/07/2017  . 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 Encounter for therapeutic drug monitoring 04/09/2017  . Right lower lobe pulmonary nodule 12/19/2016  . Noncompliance with treatment plan 12/19/2016  . Acute renal failure (ARF) ([place]) 12/18/2016  . Hyperkalemia 12/18/2016  . Hyperbilirubinemia 12/18/2016  . UTI (urinary tract infection) 05/31/2016  . Acute urinary retention 05/31/2016  . Acute kidney injury superimposed on chronic kidney disease ([place]) 05/31/2016  . Generalized weakness 05/31/2016  . Falls 05/31/2016  . Acute systolic CHF (congestive heart failure) ([place])   . Congestive dilated cardiomyopathy ([place])   . Uncontrolled hypertension   . Pulmonary hypertension ([place])   . Chronic systolic CHF (congestive heart failure) ([place])   . Paroxysmal atrial fibrillation ([place]) 12/19/2015  . Hypertension 12/19/2015  . Acute CHF ([place]) 12/19/2015  . Hyperglycemia 12/19/2015  . Acid reflux   . Hyperlipidemia   . Sinus bradycardia   . Gastroesophageal reflux disease   . Other  hyperlipidemia   . Elevated troponin     Palliative Care Assessment & Plan   Patient Profile: Mr. Rosner is a 79 year old male with PMHx stroke, hypertension, A. fib who presented with increasing cough, dyspnea, and weakness.  He [place] admitted with PNA, likely aspiration.  Plan moving forward is for comfort care.  Palliative consulted for goals of care.  Assessment: Patient Active Problem List   Diagnosis Date Noted  . Pneumonia 12/11/2019  . Sepsis ([place]) 12/10/2019  . At high risk for aspiration 09/09/2019  . Atrial fibrillation ([place]) 09/09/2019  . Hematochezia   . Lower GI bleed 09/02/2019  . SAH (subarachnoid hemorrhage) (HCC) s/p clot retrieval and stent placement 08/06/2019  . Aspiration pneumonia ([place]), likely 08/06/2019  . 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 L BBB (bundle branch block) 08/06/2019  . Prolonged Q-T interval on ECG 08/06/2019  . 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 Frequent loose stools 08/06/2019  . Acute embolic stroke ([place])   . SBO (small bowel obstruction) ([place])   . History of prostate cancer   . Stage 3 chronic kidney disease ([place])   . Agitation   . Supplemental oxygen dependent   . 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 Acute hypoxemic respiratory failure ([place])   . Acute ischemic stroke ([place]) -R MCA d/t R ICA and M2 occlusion s/p clot retrieval and R ICA stent 07/09/2019  . Vasovagal syncope 07/18/2017  . S/P TURP 07/18/2017  . Coronary artery disease 07/07/2017  . 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 Encounter for therapeutic drug monitoring 04/09/2017  . Right lower lobe pulmonary nodule 12/19/2016  . Noncompliance with treatment plan 12/19/2016  . Acute renal failure (ARF) ([place]) 12/18/2016  . Hyperkalemia 12/18/2016  . Hyperbilirubinemia 12/18/2016  . UTI (urinary tract infection) 05/31/2016  . Acute urinary retention 05/31/2016  . Acute kidney injury superimposed on  chronic kidney disease ([place]) 05/31/2016  . Generalized weakness 05/31/2016  . Falls 05/31/2016  .  Acute systolic CHF (congestive heart failure) ([place])   . Congestive dilated cardiomyopathy ([place])   . Uncontrolled hypertension   . Pulmonary hypertension ([place])   . Chronic systolic CHF (congestive heart failure) ([place])   . Paroxysmal atrial fibrillation ([place]) 12/19/2015  . Hypertension 12/19/2015  . Acute CHF ([place]) 12/19/2015  . Hyperglycemia 12/19/2015  . Acid reflux   . Hyperlipidemia   . Sinus bradycardia   . Gastroesophageal reflux disease   . Other hyperlipidemia   . Elevated troponin    Recommendations/Plan:  DNR/DNI  Comfort care moving forward.  He has been having increasing pain.  Continue to aggressively manage symptoms to ensure comfort moving forward.  Plan for transition to residential hospice for end of life care.    Goals of Care and Additional Recommendations:  Limitations on Scope of Treatment: Full Comfort Care  Code Status:    Code Status Orders  (From admission, onward)         Start     Ordered   12/10/19 1756  Do not attempt resuscitation (DNR)  Continuous       Question Answer Comment  In the event of cardiac or respiratory ARREST Do not call a "code blue"   In the event of cardiac or respiratory ARREST Do not perform Intubation, CPR, defibrillation or ACLS   In the event of cardiac or respiratory ARREST Use medication by any route, position, wound care, and other measures to relive pain and suffering. May use oxygen, suction and manual treatment of airway obstruction as needed for comfort.   Comments confirmed w/ patient and wife      12/10/19 1756        Code Status History    Date Active Date Inactive Code Status Order ID Comments User Context   09/09/2019 0304 09/10/2019 0418 Full Code [id number]  Donnamae Jude, MD Inpatient   09/02/2019 0317 09/08/2019 2112 Full Code [id number]  Rise Patience, MD Inpatient   07/10/2019 0058 07/13/2019 1130 Full  Code [id number]  Luanne Bras, MD Inpatient   07/09/2019 2133 07/10/2019 0058 Full Code [id number]  Amie Portland, MD ED   12/18/2016 2142 12/23/2016 0011 Full Code [id number]  Reubin Milan, MD Inpatient   05/31/2016 0505 06/06/2016 1756 Full Code [id number]  Norval Morton, MD ED   12/19/2015 1459 12/22/2015 1459 Full Code [id number]  Black, Lezlie Octave, NP ED   Advance Care Planning Activity    Advance Directive Documentation     Most Recent Value  Type of Advance Directive Healthcare Power of Attorney, Living will  Pre-existing out of facility DNR order (yellow form or pink MOST form) --  "MOST" Form in Place? --       Prognosis:   < 2 weeks- Focus moving forward is on comfort.  He has stopped antibiotics and tube feeds. His prognosis moving forward is < 2 weeks  Discharge Planning:  Hospice facility  Thank you for allowing the Palliative Medicine Team to assist in the care of this patient.   Total Time 20 Prolonged Time Billed No      Greater than 50%  of this time was spent counseling and coordinating care related to the above assessment and plan.  Micheline Rough, MD  Please contact Palliative Medicine Team phone at [phone] for questions and concerns.

## 2019-12-13 DIAGNOSIS — Z7189 Other specified counseling: Secondary | ICD-10-CM | POA: Diagnosis not present

## 2019-12-13 DIAGNOSIS — N179 Acute kidney failure, unspecified: Secondary | ICD-10-CM | POA: Diagnosis not present

## 2019-12-13 DIAGNOSIS — I10 Essential (primary) hypertension: Secondary | ICD-10-CM | POA: Diagnosis not present

## 2019-12-13 DIAGNOSIS — E7800 Pure hypercholesterolemia, unspecified: Secondary | ICD-10-CM | POA: Diagnosis not present

## 2019-12-13 DIAGNOSIS — I5022 Chronic systolic (congestive) heart failure: Secondary | ICD-10-CM | POA: Diagnosis not present

## 2019-12-13 DIAGNOSIS — J189 Pneumonia, unspecified organism: Secondary | ICD-10-CM | POA: Diagnosis not present

## 2019-12-13 DIAGNOSIS — A419 Sepsis, unspecified organism: Secondary | ICD-10-CM | POA: Diagnosis not present

## 2019-12-13 NOTE — Progress Notes (Signed)
PROGRESS NOTE  Stephen Copeland GMW:[id number] DOB: 04-14-1940 DOA: 12/10/2019 PCP: Clovia Cuff, MD   LOS: 3 days   Brief narrative: As per HPI,  Stephen Copeland is a 79 y.o. male with medical history significant of stroke, HTN, a fib who presented to hospital with cough and shortness of breath, dyspnea for 3 to 4 days prior to presentation.  He reported that he was recently diagnosed with pneumonia but did not take any medication as he was unable to get them.  He continued to have increasing weakness and shortness of breath and EMS was called in. As per the patient's wife his wife, she notes that he's been increasingly weak and never really recovered from his stroke. She was finding it hard to take care of him. ED Course: CXR was obtained that showed right perihilar opacity. His lactic acid was found to be elevated. He was started on sepsis protocol. Vanc, cefepime, flagyl started.  Patient was then admitted hospital for further evaluation and treatment.  Assessment/Plan:  Principal Problem:   Sepsis ([place]) Active Problems:   Paroxysmal atrial fibrillation (HCC)   Hypertension   Hyperlipidemia   Chronic systolic CHF (congestive heart failure) ([place])   Pneumonia  Sepsis likely secondary to pneumonia.  Patient met sepsis criteria on presentation with tachycardia, tachypnea and source of infection being pneumonia with acute kidney injury.  Received vancomycin and cefepime in the ED. Lactate was elevated on presentation at 2.7.   COVID-19 was negative.,  Influenza was negative.  At this time, comfort care has been initiated.  Acute kidney injury.  Creatinine on admission was 2.3.  Baseline creatinine of 1.1.  Received septic fluid bolus initially..  No further labs.  On comfort care at this time.    Hypernatremia likely secondary to dehydration   Sodium of 151 on presentation due to severe dehydration.  On free water flushes.  Severe hypokalemia of 2.9 on presentation.  Improved to 3.6.  No  further labs.  Focus on comfort at this time.  Normocytic anemia  Paroxysmal Afib, not on anticoagulation Focus on comfort.  Hx of HTN Comfort measures only.  Hx of BPH Discontinued doxazosin.  Chronic HFrEF, Dilated Cardiomyopathy No acute heart failure at this time.  Focus on comfort.  Patient did have 2D echocardiogram 07/10/2019 showing ejection fraction of 35%.   Hx of CVA w/ residuals,Dysphagia,Dysarthria PEG tube in place.  Continue free water flushes through the PEG tube.  Goals of care Palliative care on board.   Debility, deconditioning    Sacral deep tissue injury     -Present on admission.  Supportive care  DVT prophylaxis: None for comfort.  Code Status: DNR  Family Communication:  I spoke with the patient's sister on the phone and updated her about the clinical condition of the patient.   Status is: Inpatient  Remains inpatient appropriate because:Unsafe d/c plan, IV treatments appropriate due to intensity of illness or inability to take PO and Inpatient level of care appropriate due to severity of illness , currently on comfort care awaiting for hospice   Dispo: The patient is from: Home              Anticipated d/c is to:  residential hospice               Anticipated d/c date is: 2 days              Patient currently is not medically stable to d/c.  Consultants:  Palliative care  Procedures:  None   Antibiotics:  . none  Anti-infectives (From admission, onward)   Start     Dose/Rate Route Frequency Ordered Stop   12/11/19 1400  ceFEPIme (MAXIPIME) 2 g in sodium chloride 0.9 % 100 mL IVPB  Status:  Discontinued        2 g 200 mL/hr over 30 Minutes Intravenous Every 24 hours 12/10/19 1905 12/11/19 1425   12/11/19 1000  vancomycin (VANCOCIN) IVPB 1000 mg/200 mL premix  Status:  Discontinued        1,000 mg 200 mL/hr over 60 Minutes Intravenous Every 36 hours 12/10/19 1905 12/11/19 1422   12/10/19 1330  ceFEPIme (MAXIPIME) 2 g in sodium  chloride 0.9 % 100 mL IVPB        2 g 200 mL/hr over 30 Minutes Intravenous  Once 12/10/19 1328 12/10/19 1523   12/10/19 1330  metroNIDAZOLE (FLAGYL) IVPB 500 mg        500 mg 100 mL/hr over 60 Minutes Intravenous  Once 12/10/19 1328 12/10/19 1557   12/10/19 1330  vancomycin (VANCOCIN) IVPB 1000 mg/200 mL premix        1,000 mg 200 mL/hr over 60 Minutes Intravenous  Once 12/10/19 1328 12/10/19 1641     Subjective: Today, mildly communicative, denies pain, on comfort care.   Objective: Vitals:   12/12/19 1336 12/12/19 2038  BP: 133/76 (!) 87/69  Pulse: 79 87  Resp: 14 20  Temp: 97.8 F (36.6 C) 97.8 F (36.6 C)  SpO2: 93% 94%    Intake/Output Summary (Last 24 hours) at 12/13/2019 0838 Last data filed at 12/13/2019 0834 Gross per 24 hour  Intake 0 ml  Output 400 ml  Net -400 ml   There were no vitals filed for this visit. There is no height or weight on file to calculate BMI.   Physical Exam: GENERAL: Patient is somnolent but easy to arouse, mildly communicative not in obvious distress.  Nasal cannula oxygen.  Elderly male, deconditioned HENT: Mild pallor noted pupils equally reactive to light. Oral mucosa is dry. NECK: is supple, no gross swelling noted. CHEST: Coarse breath sounds noted.  Breath sounds bilaterally. CVS: S1 and S2 heard, no murmur.  Irregular rhythm. ABDOMEN: Soft, non-tender, bowel sounds are present.  Sacral DTI.  PEG tube in place. EXTREMITIES: No edema.  CNS: Dysarthria noted, mildly somnolent able to communicate and answer few questions. SKIN: warm and dry, sacral DTI  Data Review: I have personally reviewed the following laboratory data and studies,  CBC: Recent Labs  Lab 12/10/19 1328 12/10/19 2100 12/11/19 0440  WBC 9.4 8.1 7.0  NEUTROABS 6.9  --   --   HGB 9.8* 11.3* 10.7*  HCT 31.7* 35.0* 35.4*  MCV 90.3 86.8 90.8  PLT 324 193 419   Basic Metabolic Panel: Recent Labs  Lab 12/10/19 1328 12/11/19 0434 12/11/19 0440  NA 151*  151*  --   K 2.9* 3.6  --   CL 113* 113*  --   CO2 23 21*  --   GLUCOSE 124* 97  --   BUN 126* 94*  --   CREATININE 2.43* 1.99* 2.01*  CALCIUM 9.6 8.9  --   MG 2.4  --   --    Liver Function Tests: Recent Labs  Lab 12/10/19 1328  AST 23  ALT 25  ALKPHOS 91  BILITOT 1.5*  PROT 7.6  ALBUMIN 3.3*   No results for input(s): LIPASE, AMYLASE in the last 168 hours. No results for input(s): AMMONIA in  the last 168 hours. Cardiac Enzymes: No results for input(s): CKTOTAL, CKMB, CKMBINDEX, TROPONINI in the last 168 hours. BNP (last 3 results) Recent Labs    09/08/19 2155  BNP 161.5*    ProBNP (last 3 results) No results for input(s): PROBNP in the last 8760 hours.  CBG: No results for input(s): GLUCAP in the last 168 hours. Recent Results (from the past 240 hour(s))  Blood culture (routine single)     Status: None (Preliminary result)   Collection Time: 12/10/19  1:28 PM   Specimen: BLOOD  Result Value Ref Range Status   Specimen Description   Final    BLOOD RIGHT ANTECUBITAL Performed at [facility] [address] [place], [place] [address]    Special Requests   Final    BOTTLES DRAWN AEROBIC AND ANAEROBIC Blood Culture results may not be optimal due to an inadequate volume of blood received in culture bottles Performed at [facility] [address], [place], [place] [address]    Culture   Final    NO GROWTH 2 DAYS Performed at [facility] [address] [place], [place] [address]    Report Status PENDING  Incomplete  Urine culture     Status: Abnormal   Collection Time: 12/10/19  1:28 PM   Specimen: In/Out Cath Urine  Result Value Ref Range Status   Specimen Description   Final    IN/OUT CATH URINE Performed at [facility] [address] [place], [place] [address]    Special Requests   Final    NONE Performed at [facility], [place] [address] [place], [place]  [address]    Culture MULTIPLE SPECIES PRESENT, SUGGEST RECOLLECTION (A)  Final   Report Status 12/12/2019 FINAL  Final  Respiratory Panel by RT PCR (Flu A&B, Covid) - Nasopharyngeal Swab     Status: None   Collection Time: 12/10/19  3:58 PM   Specimen: Nasopharyngeal Swab  Result Value Ref Range Status   SARS Coronavirus 2 by RT PCR NEGATIVE NEGATIVE Final    Comment: (NOTE) SARS-CoV-2 target nucleic acids are NOT DETECTED.  The SARS-CoV-2 RNA is generally detectable in upper respiratoy specimens during the acute phase of infection. The lowest concentration of SARS-CoV-2 viral copies this assay can detect is 131 copies/mL. A negative result does not preclude SARS-Cov-2 infection and should not be used as the sole basis for treatment or other patient management decisions. A negative result may occur with  improper specimen collection/handling, submission of specimen other than nasopharyngeal swab, presence of viral mutation(s) within the areas targeted by this assay, and inadequate number of viral copies (<131 copies/mL). A negative result must be combined with clinical observations, patient history, and epidemiological information. The expected result is Negative.  Fact Sheet for Patients:  PinkCheek.be  Fact Sheet for Healthcare Providers:  GravelBags.it  This test is no t yet approved or cleared by the [place] FDA and  has been authorized for detection and/or diagnosis of SARS-CoV-2 by FDA under an Emergency Use Authorization (EUA). This EUA will remain  in effect (meaning this test can be used) for the duration of the COVID-19 declaration under Section 564(b)(1) of the Act, 21 U.S.C. section 360bbb-3(b)(1), unless the authorization is terminated or revoked sooner.     Influenza A by PCR NEGATIVE NEGATIVE Final   Influenza B by PCR NEGATIVE NEGATIVE Final    Comment: (NOTE) The Xpert Xpress SARS-CoV-2/FLU/RSV  assay is intended as  an aid in  the diagnosis of influenza from Nasopharyngeal swab specimens and  should not be used as a sole basis for treatment. Nasal washings and  aspirates are unacceptable for Xpert Xpress SARS-CoV-2/FLU/RSV  testing.  Fact Sheet for Patients: PinkCheek.be  Fact Sheet for Healthcare Providers: GravelBags.it  This test is not yet approved or cleared by the [place] FDA and  has been authorized for detection and/or diagnosis of SARS-CoV-2 by  FDA under an Emergency Use Authorization (EUA). This EUA will remain  in effect (meaning this test can be used) for the duration of the  Covid-19 declaration under Section 564(b)(1) of the Act, 21  U.S.C. section 360bbb-3(b)(1), unless the authorization is  terminated or revoked. Performed at [facility], [place] [address] [place], [place] [address]      Studies: No results found.    Flora Lipps, MD  [facility] 12/13/2019

## 2019-12-13 NOTE — TOC Progression Note (Signed)
Transition of Care [facility]) - Progression Note    Patient Details  Name: Stephen Copeland MRN: [id number] Date of Birth: [birth year]  Transition of Care [facility]) CM/SW [place], [place] Phone Number:  [phone] 12/13/2019, 2:21 PM  Clinical Narrative:     Spoke with Fraser Din from [facility] hospice and she is waiting for the patient's POA to sign paperwork.  Fraser Din stated she would update this CSW when the paperwork was signed to set up transport.  Expected Discharge Plan: [place] Barriers to Discharge: No Barriers Identified  Expected Discharge Plan and Services Expected Discharge Plan: [place] arrangements for the past 2 months: Single Family Home                                       Social Determinants of Health (SDOH) Interventions    Readmission Risk Interventions No flowsheet data found.

## 2019-12-13 NOTE — Consult Note (Signed)
Received referral for [facility]HH[facility].  Bed is available today.  Spoke with POA, Delia, and she may not be able to sign consents until tomorrow.  Awaiting call from [facility] to arrange transport if consents can be signed today.

## 2019-12-13 NOTE — Progress Notes (Signed)
Daily Progress Note   Patient Name: Stephen Copeland       Date: 12/13/2019 DOB: [birth year]  Age: 79 y.o. MRN#: [id number] Attending Physician: Flora Lipps, MD Primary Care Physician: Clovia Cuff, MD Admit Date: 12/10/2019  Reason for Consultation/Follow-up: Establishing goals of care, Non pain symptom management and Pain control  Subjective: I saw and examined Stephen Copeland today.    He was lying in bed.  He appears weaker and speech more garbled and difficult to understand today.    Denies pain currently, but I question if he is able to effectively report pain.  Reports that he thought people were fighting in the hall.  "I think he got shot in the back."  Then he paused and looked off and stated, "hallucinating."    He didn't answer any further questions.    Length of Stay: 3  Current Medications: Scheduled Meds:  . free water  50 mL Per Tube Q4H  . lidocaine  1 patch Transdermal Q24H    Continuous Infusions:   PRN Meds: acetaminophen **OR** acetaminophen, LORazepam, morphine injection  Physical Exam       General: Lying in bed in no distress.  Weak and frail. Heart: Regular rate and rhythm. No murmur appreciated. Lungs: Decreased air movement, coarse  Abdomen: Soft, nondistended, positive bowel sounds.  Ext: No significant edema Skin: Warm and dry    Vital Signs: BP (!) 87/69 (BP Location: Left Arm)   Pulse 87   Temp 97.8 F (36.6 C) (Oral)   Resp 20   SpO2 94%  SpO2: SpO2: 94 % O2 Device: O2 Device: Room Air O2 Flow Rate: O2 Flow Rate (L/min): 2 L/min  Intake/output summary:   Intake/Output Summary (Last 24 hours) at 12/13/2019 0926 Last data filed at 12/13/2019 0834 Gross per 24 hour  Intake 0 ml  Output 400 ml  Net -400 ml   LBM: Last BM Date:   (unknown) Baseline Weight:   Most recent weight:         Palliative Assessment/Data:      Patient Active Problem List   Diagnosis Date Noted  . Pneumonia 12/11/2019  . Sepsis ([place]) 12/10/2019  . At high risk for aspiration 09/09/2019  . Atrial fibrillation ([place]) 09/09/2019  . Hematochezia   . Lower GI bleed 09/02/2019  . SAH (subarachnoid hemorrhage) (  [place]) s/p clot retrieval and stent placement 08/06/2019  . Aspiration pneumonia ([place]), likely 08/06/2019  . 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 L BBB (bundle branch block) 08/06/2019  . Prolonged Q-T interval on ECG 08/06/2019  . 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 Frequent loose stools 08/06/2019  . Acute embolic stroke ([place])   . SBO (small bowel obstruction) ([place])   . History of prostate cancer   . Stage 3 chronic kidney disease ([place])   . Agitation   . Supplemental oxygen dependent   . 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 Acute hypoxemic respiratory failure ([place])   . Acute ischemic stroke ([place]) -R MCA d/t R ICA and M2 occlusion s/p clot retrieval and R ICA stent 07/09/2019  . Vasovagal syncope 07/18/2017  . S/P TURP 07/18/2017  . Coronary artery disease 07/07/2017  . 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 Encounter for therapeutic drug monitoring 04/09/2017  . Right lower lobe pulmonary nodule 12/19/2016  . Noncompliance with treatment plan 12/19/2016  . Acute renal failure (ARF) ([place]) 12/18/2016  . Hyperkalemia 12/18/2016  . Hyperbilirubinemia 12/18/2016  . UTI (urinary tract infection) 05/31/2016  . Acute urinary retention 05/31/2016  . Acute kidney injury superimposed on chronic kidney disease ([place]) 05/31/2016  . Generalized weakness 05/31/2016  . Falls 05/31/2016  . Acute systolic CHF (congestive heart failure) ([place])   . Congestive dilated cardiomyopathy ([place])   . Uncontrolled hypertension   . Pulmonary hypertension ([place])   . Chronic  systolic CHF (congestive heart failure) ([place])   . Paroxysmal atrial fibrillation ([place]) 12/19/2015  . Hypertension 12/19/2015  . Acute CHF ([place]) 12/19/2015  . Hyperglycemia 12/19/2015  . Acid reflux   . Hyperlipidemia   . Sinus bradycardia   . Gastroesophageal reflux disease   . Other hyperlipidemia   . Elevated troponin     Palliative Care Assessment & Plan   Patient Profile: Stephen Copeland is a 79 year old male with PMHx stroke, hypertension, A. fib who presented with increasing cough, dyspnea, and weakness.  [place] admitted with PNA, likely aspiration.  Plan moving forward is for comfort care.  Palliative consulted for goals of care.  Assessment: Patient Active Problem List   Diagnosis Date Noted  . Pneumonia 12/11/2019  . Sepsis ([place]) 12/10/2019  . At high risk for aspiration 09/09/2019  . Atrial fibrillation ([place]) 09/09/2019  . Hematochezia   . Lower GI bleed 09/02/2019  . SAH (subarachnoid hemorrhage) (HCC) s/p clot retrieval and stent placement 08/06/2019  . Aspiration pneumonia ([place]), likely 08/06/2019  . 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 L BBB (bundle branch block) 08/06/2019  . Prolonged Q-T interval on ECG 08/06/2019  . 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 Frequent loose stools 08/06/2019  . Acute embolic stroke ([place])   . SBO (small bowel obstruction) ([place])   . History of prostate cancer   . Stage 3 chronic kidney disease ([place])   . Agitation   . Supplemental oxygen dependent   . 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 Acute hypoxemic respiratory failure ([place])   . Acute ischemic stroke ([place]) -R MCA d/t R ICA and M2 occlusion s/p clot retrieval and R ICA stent 07/09/2019  . Vasovagal syncope 07/18/2017  . S/P TURP 07/18/2017  . Coronary artery disease 07/07/2017  . 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 Encounter for therapeutic drug monitoring 04/09/2017  . Right  lower lobe pulmonary nodule  12/19/2016  . Noncompliance with treatment plan 12/19/2016  . Acute renal failure (ARF) ([place]) 12/18/2016  . Hyperkalemia 12/18/2016  . Hyperbilirubinemia 12/18/2016  . UTI (urinary tract infection) 05/31/2016  . Acute urinary retention 05/31/2016  . Acute kidney injury superimposed on chronic kidney disease ([place]) 05/31/2016  . Generalized weakness 05/31/2016  . Falls 05/31/2016  . Acute systolic CHF (congestive heart failure) ([place])   . Congestive dilated cardiomyopathy ([employer] [employer])   . Uncontrolled hypertension   . Pulmonary hypertension ([place])   . Chronic systolic CHF (congestive heart failure) ([place])   . Paroxysmal atrial fibrillation ([place]) 12/19/2015  . Hypertension 12/19/2015  . Acute CHF ([place]) 12/19/2015  . Hyperglycemia 12/19/2015  . Acid reflux   . Hyperlipidemia   . Sinus bradycardia   . Gastroesophageal reflux disease   . Other hyperlipidemia   . Elevated troponin    Recommendations/Plan:  DNR/DNI  Comfort care moving forward.  Continue aggressive management of symptoms.  Plan for transition to residential hospice for end of life care.    Goals of Care and Additional Recommendations:  Limitations on Scope of Treatment: Full Comfort Care  Code Status:    Code Status Orders  (From admission, onward)         Start     Ordered   12/10/19 1756  Do not attempt resuscitation (DNR)  Continuous       Question Answer Comment  In the event of cardiac or respiratory ARREST Do not call a "code blue"   In the event of cardiac or respiratory ARREST Do not perform Intubation, CPR, defibrillation or ACLS   In the event of cardiac or respiratory ARREST Use medication by any route, position, wound care, and other measures to relive pain and suffering. May use oxygen, suction and manual treatment of airway obstruction as needed for comfort.   Comments confirmed w/ patient and wife      12/10/19 1756        Code Status History    Date Active Date Inactive Code Status Order ID  Comments User Context   09/09/2019 0304 09/10/2019 0418 Full Code [id number]  Donnamae Jude, MD Inpatient   09/02/2019 0317 09/08/2019 2112 Full Code [id number]  Rise Patience, MD Inpatient   07/10/2019 0058 07/13/2019 1130 Full Code [id number]  Luanne Bras, MD Inpatient   07/09/2019 2133 07/10/2019 0058 Full Code [id number]  Amie Portland, MD ED   12/18/2016 2142 12/23/2016 0011 Full Code [id number]  Reubin Milan, MD Inpatient   05/31/2016 0505 06/06/2016 1756 Full Code [id number]  Norval Morton, MD ED   12/19/2015 1459 12/22/2015 1459 Full Code [id number]  Black, Lezlie Octave, NP ED   Advance Care Planning Activity    Advance Directive Documentation     Most Recent Value  Type of Advance Directive Healthcare Power of Attorney, Living will  Pre-existing out of facility DNR order (yellow form or pink MOST form) --  "MOST" Form in Place? --       Prognosis:   < 2 weeks- Focus moving forward is on comfort.  He has stopped antibiotics and tube feeds. His prognosis moving forward is < 2 weeks  Discharge Planning:  Hospice facility  Thank you for allowing the Palliative Medicine Team to assist in the care of this patient.   Total Time 20 Prolonged Time Billed No   Greater than 50%  of this time was spent counseling and coordinating care related to the above assessment and plan.  Micheline Rough, MD  Please contact Palliative Medicine Team phone at [phone] for questions and concerns.

## 2019-12-13 NOTE — Progress Notes (Signed)
Manufacturing engineer [facility])  [facility] does not have any open beds for Mr. Mattera today.  Noted that [facility] likely has a bed for him.    Venia Carbon RN, BSN, [facility] Liaison

## 2019-12-13 NOTE — Progress Notes (Signed)
Received a call from the nurse at [facility] asking for report about the pt. Explained to the nurse that at this time the pt is not yet discharge and no words from SW/CM communicated that the pt is leaving. She said the pt suppose to arrived at their facility between 8-9pm. Again explained to her that there were no paperworks initiated by SW/CM or any info regarding a discharge. Spoke with the charge nurse about this situation.

## 2019-12-14 DIAGNOSIS — A419 Sepsis, unspecified organism: Secondary | ICD-10-CM | POA: Diagnosis not present

## 2019-12-14 DIAGNOSIS — I5022 Chronic systolic (congestive) heart failure: Secondary | ICD-10-CM | POA: Diagnosis not present

## 2019-12-14 DIAGNOSIS — E7800 Pure hypercholesterolemia, unspecified: Secondary | ICD-10-CM | POA: Diagnosis not present

## 2019-12-14 DIAGNOSIS — J189 Pneumonia, unspecified organism: Secondary | ICD-10-CM | POA: Diagnosis not present

## 2019-12-14 DIAGNOSIS — Z7189 Other specified counseling: Secondary | ICD-10-CM | POA: Diagnosis not present

## 2019-12-14 DIAGNOSIS — N179 Acute kidney failure, unspecified: Secondary | ICD-10-CM | POA: Diagnosis not present

## 2019-12-14 DIAGNOSIS — I10 Essential (primary) hypertension: Secondary | ICD-10-CM | POA: Diagnosis not present

## 2019-12-14 MED ORDER — LORAZEPAM 2 MG/ML IJ SOLN: 1.0000 mg | INTRAMUSCULAR | 0 refills | Status: AC | PRN

## 2019-12-14 MED ORDER — ACETAMINOPHEN 650 MG RE SUPP: 650.0000 mg | Freq: Four times a day (QID) | RECTAL | 0 refills | Status: AC | PRN

## 2019-12-14 MED ORDER — MORPHINE SULFATE (PF) 2 MG/ML IV SOLN
2.0000 mg | INTRAVENOUS | 0 refills | Status: AC | PRN
Start: 2019-12-14 — End: ?

## 2019-12-14 NOTE — Discharge Summary (Addendum)
Physician Discharge Summary  Stephen Copeland WHQ:[id number] DOB: [birth year] DOA: 12/10/2019  PCP: Clovia Cuff, MD  Admit date: 12/10/2019 Discharge date: 12/14/2019  Admitted From: Home  Discharge disposition: Residential hospice   Recommendations for Outpatient Follow-Up:   . 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  Code status: DNR   History of Present Illness:   Stephen Copeland a 79 y.o.malewith medical history significant ofstroke, HTN, a fib, congestive heart failure, history of aspiration pneumonia, history of prostate cancer, hyperlipidemia, pulmonary hypertension, chronic kidney disease, who presented to hospital with cough and shortness of breath, dyspnea for 3 to 4 days prior to presentation.  He reported that he was recently diagnosed with pneumonia but did not take any medication as he was unable to get them.  He continued to have increasing weakness and shortness of breath and EMS was called in. As per the patient's wife, she noted that he had been increasingly weak and never really recovered from his stroke. She was finding it hard to take care of him. ED Course:CXR was obtained that showed right perihilar opacity. His lactic acid was found to be elevated. He was started on sepsis protocol. Vanc, cefepime, flagyl started.  Patient was then admitted hospital for further evaluation and treatment.  Hospital Course:   Following conditions were addressed during hospitalization as listed below,  Sepsis likely secondary  to aspiration pneumonia.  Patient met sepsis criteria on presentation with tachycardia, tachypnea and source of infection being pneumonia with acute kidney injury.  Received vancomycin and cefepime in the ED. Lactate was elevated on presentation at 2.7.   COVID-19 was negative.,  Influenza was negative.  At this time, comfort care has been initiated.  Acute kidney injury.  Creatinine on admission was 2.3.  Baseline creatinine of 1.1.  Received septic fluid bolus initially.  No further labs.  On comfort care at this time.    Hypernatremia likely secondary to dehydration   Sodium of 151 on presentation due to severe dehydration.  On free water flushes.  Severe hypokalemia of 2.9 on presentation.  Improved to 3.6.  No further labs.  Focus on comfort at this time.  Normocytic anemia  Paroxysmal Afib, not on anticoagulation  Focus on comfort.  Hx of HTN Comfort measures only.  Hx of BPH Discontinued doxazosin.  Chronic HFrEF, Dilated Cardiomyopathy No acute heart failure at this time.  Focus on comfort.  Patient did have 2D echocardiogram 07/10/2019 showing ejection fraction of 35%.   Hx of CVA w/ residuals,Dysphagia,Dysarthria PEG tube in place.  ok to use free water flushes through the PEG tube.  Goals of care Palliative care on board.   Debility, deconditioning  Sacral deep tissue injury -Present on admission.  Supportive care  Disposition.  At this time, patient is stable for disposition to residential hospice.  Spoke with the patient's sister on the phone today.  Medical Consultants:    Palliative care  Procedures:    None Subjective:   Today, patient is mildly communicative.  Feels thirsty.  Denies pain, denies nausea  vomiting or shortness of breath.  Feels comfortable.  Discharge Exam:   Vitals:   12/13/19 1341 12/14/19 0619  BP: 120/76 (!) 128/108  Pulse: 81 60  Resp: 19 16  Temp: (!) 97.5 F (36.4 C) 97.8 F (36.6 C)  SpO2: 100% 95%    Vitals:   12/12/19 1336 12/12/19 2038 12/13/19 1341 12/14/19 0619  BP: 133/76 (!) 87/69 120/76 (!) 128/108  Pulse: 79 87 81 60  Resp: '14 20 19 16  ' Temp: 97.8 F (36.6 C) 97.8 F (36.6 C) (!) 97.5 F (36.4 C) 97.8 F (36.6 C)  TempSrc:  Oral Oral Oral  SpO2: 93% 94% 100% 95%   General: Alert awake, not in obvious distress nasal cannula in place.  Elderly deconditioned male HENT: pupils equally reacting to light,  No scleral pallor or icterus noted. Oral mucosa is dry Chest: Coarse breath sounds noted bilaterally. CVS: S1 &S2 heard. No murmur.  Regular rate and rhythm. Abdomen: Soft, nontender, nondistended.  Bowel sounds are heard.  PEG tube in place. Extremities: No cyanosis, clubbing or edema.  Peripheral pulses are palpable. Psych: Communicative, dysarthria  CNS: Moving extremities dysarthric.   Skin: Warm and dry.   The results of significant diagnostics from this hospitalization (including imaging, microbiology, ancillary and laboratory) are listed below for reference.     Diagnostic Studies:   US RENAL  Result Date: 12/10/2019 CLINICAL DATA:  Acute kidney injury EXAM: RENAL / URINARY TRACT ULTRASOUND COMPLETE COMPARISON:  None. FINDINGS: Right Kidney: Renal measurements: 10 x 5.5 x 4.2 cm = volume: 121 mL. There is no definite hydronephrosis. There is apparent increased cortical echogenicity. Left Kidney: Renal measurements: 10 x 6.5 x 4.1 cm = volume: 139 mL mL. There is increased cortical echogenicity without evidence for hydronephrosis. Bladder: Appears normal for degree of bladder distention. Other: Prostate gland is enlarged measuring approximately 34 mL in volume. There is prominent median lobe hypertrophy. IMPRESSION: 1. Exam limited by patient body habitus. 2. Echogenic kidneys bilaterally which can be seen in patients with medical renal disease. No hydronephrosis. Electronically Signed   By: Constance Holster M.D.   On: 12/10/2019 19:19   DG Chest Port 1  View  Result Date: 12/10/2019 CLINICAL DATA:  Pneumonia EXAM: PORTABLE CHEST 1 VIEW COMPARISON:  09/08/2019 FINDINGS: Stable cardiomediastinal contours. Atherosclerotic calcification of the aortic knob. Streaky right perihilar opacity. Chronic mild interstitial prominence within the left lung base. No pleural effusion or pneumothorax. IMPRESSION: Streaky right perihilar opacity may represent atelectasis or developing infiltrate. Electronically Signed   By: Davina Poke D.O.   On: 12/10/2019 13:37     Labs:   Basic Metabolic Panel: Recent Labs  Lab 12/10/19 1328 12/11/19 0434 12/11/19 0440  NA 151* 151*  --   K 2.9* 3.6  --   CL 113* 113*  --   CO2 23 21*  --   GLUCOSE 124* 97  --   BUN 126* 94*  --   CREATININE 2.43* 1.99* 2.01*  CALCIUM 9.6 8.9  --   MG 2.4  --   --    GFR CrCl cannot be calculated (Unknown ideal weight.). Liver Function Tests: Recent Labs  Lab 12/10/19 1328  AST 23  ALT 25  ALKPHOS 91  BILITOT 1.5*  PROT 7.6  ALBUMIN 3.3*   No results for input(s): LIPASE, AMYLASE in the last 168 hours. No results for input(s): AMMONIA in the last 168 hours. Coagulation profile Recent Labs  Lab 12/10/19 1328 12/11/19 0440  INR 1.1  1.2    CBC: Recent Labs  Lab 12/10/19 1328 12/10/19 2100 12/11/19 0440  WBC 9.4 8.1 7.0  NEUTROABS 6.9  --   --   HGB 9.8* 11.3* 10.7*  HCT 31.7* 35.0* 35.4*  MCV 90.3 86.8 90.8  PLT 324 193 176   Cardiac Enzymes: No results for input(s): CKTOTAL, CKMB, CKMBINDEX, TROPONINI in the last 168 hours. BNP: Invalid input(s): POCBNP CBG: No results for input(s): GLUCAP in the last 168 hours. D-Dimer No results for input(s): DDIMER in the last 72 hours. Hgb A1c No results for input(s): HGBA1C in the last 72 hours. Lipid Profile No results for input(s): CHOL, HDL, LDLCALC, TRIG, CHOLHDL, LDLDIRECT in the last 72 hours. Thyroid function studies No results for input(s): TSH, T4TOTAL, T3FREE, THYROIDAB in the last 72  hours.  Invalid input(s): FREET3 Anemia work up No results for input(s): VITAMINB12, FOLATE, FERRITIN, TIBC, IRON, RETICCTPCT in the last 72 hours. Microbiology Recent Results (from the past 240 hour(s))  Blood culture (routine single)     Status: None (Preliminary result)   Collection Time: 12/10/19  1:28 PM   Specimen: BLOOD  Result Value Ref Range Status   Specimen Description   Final    BLOOD RIGHT ANTECUBITAL Performed at [facility] [address], [place], [place] [address]    Special Requests   Final    BOTTLES DRAWN AEROBIC AND ANAEROBIC Blood Culture results may not be optimal due to an inadequate volume of blood received in culture bottles Performed at [facility] [address], [place], [place] [address]    Culture   Final    NO GROWTH 4 DAYS Performed at [facility], [place] [address] [place], [place] [address]    Report Status PENDING  Incomplete  Urine culture     Status: Abnormal   Collection Time: 12/10/19  1:28 PM   Specimen: In/Out Cath Urine  Result Value Ref Range Status   Specimen Description   Final    IN/OUT CATH URINE Performed at [facility] [address] [place],  [address]    Special Requests   Final    NONE Performed at [facility], [place] [address], [place],  [address]    Culture MULTIPLE SPECIES PRESENT, SUGGEST RECOLLECTION (A)  Final   Report Status 12/12/2019 FINAL  Final  Respiratory Panel by RT PCR (Flu A&B, Covid) - Nasopharyngeal Swab     Status: None   Collection Time: 12/10/19  3:58 PM   Specimen: Nasopharyngeal Swab  Result Value Ref Range Status   SARS Coronavirus 2 by RT PCR NEGATIVE NEGATIVE Final    Comment: (NOTE) SARS-CoV-2 target nucleic acids are NOT DETECTED.  The SARS-CoV-2 RNA is generally detectable in upper respiratoy specimens during the acute phase of infection. The lowest concentration of SARS-CoV-2 viral copies  this assay can detect is 131 copies/mL. A negative result does not preclude SARS-Cov-2 infection and should not be used as the sole basis for treatment or other patient management decisions. A negative result may occur with  improper specimen collection/handling, submission of specimen other than nasopharyngeal swab, presence of viral mutation(s) within the areas targeted by this assay, and inadequate number of viral copies (<131 copies/mL). A negative result must be combined with clinical observations, patient history, and epidemiological information. The expected result is Negative.  Fact Sheet for Patients:  PinkCheek.be  Fact Sheet for Healthcare Providers:  GravelBags.it  This test is no t yet  approved or cleared by the [place] and  has been authorized for detection and/or diagnosis of SARS-CoV-2 by FDA under an Emergency Use Authorization (EUA). This EUA will remain  in effect (meaning this test can be used) for the duration of the COVID-19 declaration under Section 564(b)(1) of the Act, 21 U.S.C. section 360bbb-3(b)(1), unless the authorization is terminated or revoked sooner.     Influenza A by PCR NEGATIVE NEGATIVE Final   Influenza B by PCR NEGATIVE NEGATIVE Final    Comment: (NOTE) The Xpert Xpress SARS-CoV-2/FLU/RSV assay is intended as an aid in  the diagnosis of influenza from Nasopharyngeal swab specimens and  should not be used as a sole basis for treatment. Nasal washings and  aspirates are unacceptable for Xpert Xpress SARS-CoV-2/FLU/RSV  testing.  Fact Sheet for Patients: PinkCheek.be  Fact Sheet for Healthcare Providers: GravelBags.it  This test is not yet approved or cleared by the [place] FDA and  has been authorized for detection and/or diagnosis of SARS-CoV-2 by  FDA under an Emergency Use Authorization (EUA). This EUA  will remain  in effect (meaning this test can be used) for the duration of the  Covid-19 declaration under Section 564(b)(1) of the Act, 21  U.S.C. section 360bbb-3(b)(1), unless the authorization is  terminated or revoked. Performed at [facility], [place] [address] [place], [place] [address]      Discharge Instructions:   Discharge Instructions    Discharge instructions   Complete by: As directed    As per hospice   No wound care   Complete by: As directed      Allergies as of 12/14/2019      Reactions   Metoclopramide Other (See Comments)   Tardive dyskinesia      Medication List    STOP taking these medications   acetaminophen 325 MG tablet Commonly known as: TYLENOL Replaced by: acetaminophen 650 MG suppository   clopidogrel 75 MG tablet Commonly known as: PLAVIX   doxazosin 4 MG 24 hr tablet Commonly known as: CARDURA XL   feeding supplement (JEVITY 1.5 CAL/FIBER) Liqd   feeding supplement (PRO-STAT SUGAR FREE 64) Liqd   free water Soln   furosemide 40 MG tablet Commonly known as: LASIX   hydrALAZINE 25 MG tablet Commonly known as: APRESOLINE   lidocaine 5 % ointment Commonly known as: XYLOCAINE   metoprolol succinate 25 MG 24 hr tablet Commonly known as: TOPROL-XL   metoprolol tartrate 25 mg/10 mL Susp Commonly known as: LOPRESSOR   pantoprazole sodium 40 mg/20 mL Pack Commonly known as: PROTONIX   sucralfate 1 GM/10ML suspension Commonly known as: CARAFATE   tamsulosin 0.4 MG Caps capsule Commonly known as: FLOMAX     TAKE these medications   acetaminophen 650 MG suppository Commonly known as: TYLENOL Place 1 suppository (650 mg total) rectally every 6 (six) hours as needed for mild pain (or Fever >/= 101). Replaces: acetaminophen 325 MG tablet   Gerhardt's butt cream Crea Apply 1 application topically every 8 (eight) hours as needed for irritation.   LORazepam 2 MG/ML injection Commonly known as:  ATIVAN Inject 0.5 mLs (1 mg total) into the vein every 4 (four) hours as needed for anxiety.   morphine 2 MG/ML injection Inject 1 mL (2 mg total) into the vein every 3 (three) hours as needed (airhunger).         Time coordinating discharge: 39 minutes  Signed:  Bakari Nikolai  [facility] 12/14/2019, 12:33 PM

## 2019-12-14 NOTE — Progress Notes (Deleted)
Chaplain visited patient.  Wife bedside.  .  Patient asked chaplain if "she had his meal."  Wife explained they had been waiting 1.5 hours for it. Wife then went on to say "This is a good sign! He's hungry. When he was in ICU he didn't eat." Chaplain offered support if needed. Rev. Tamsen Snider Pager [phone]

## 2019-12-14 NOTE — Progress Notes (Signed)
Daily Progress Note   Patient Name: Stephen Copeland       Date: 12/14/2019 DOB: Nov 12, 1940  Age: 79 y.o. MRN#: [id number] Attending Physician: Flora Lipps, MD Primary Care Physician: Clovia Cuff, MD Admit Date: 12/10/2019  Reason for Consultation/Follow-up: Establishing goals of care, Non pain symptom management and Pain control  Subjective: I saw and examined Stephen Copeland today.    He slept throughout encounter today.  Resp regular.  He looks stable for d/c to residential hospice.  Length of Stay: 4  Current Medications: Scheduled Meds:   free water  50 mL Per Tube Q4H   lidocaine  1 patch Transdermal Q24H    Continuous Infusions:   PRN Meds: acetaminophen **OR** acetaminophen, LORazepam, morphine injection  Physical Exam       General: Sleeping. Lungs: Decreased air movement Abdomen: Soft, nondistended, positive bowel sounds.  Ext: No significant edema Skin: Warm and dry    Vital Signs: BP (!) 128/108 (BP Location: Left Arm)    Pulse 60    Temp 97.8 F (36.6 C) (Oral)    Resp 16    SpO2 95%  SpO2: SpO2: 95 % O2 Device: O2 Device: Room Air O2 Flow Rate: O2 Flow Rate (L/min): 2 L/min  Intake/output summary:   Intake/Output Summary (Last 24 hours) at 12/14/2019 1038 Last data filed at 12/13/2019 1836 Gross per 24 hour  Intake 0 ml  Output 0 ml  Net 0 ml   LBM: Last BM Date:  (unknown) Baseline Weight:   Most recent weight:         Palliative Assessment/Data:      Patient Active Problem List   Diagnosis Date Noted   Pneumonia 12/11/2019   Sepsis ([place]) 12/10/2019   At high risk for aspiration 09/09/2019   Atrial fibrillation ([place]) 09/09/2019   Hematochezia    Lower GI bleed 09/02/2019   SAH (subarachnoid hemorrhage) (HCC) s/p clot  retrieval and stent placement 08/06/2019   Aspiration pneumonia ([place]), likely 08/06/2019   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 08/06/2019   L BBB (bundle branch block) 08/06/2019   Prolonged Q-T interval on ECG 08/06/2019   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 08/06/2019   Frequent loose stools 54/00/8676   Acute embolic stroke (HCC)    SBO (small bowel obstruction) ([place])    History  of prostate cancer    Stage 3 chronic kidney disease (HCC)    Agitation    Supplemental oxygen dependent    Middle cerebral artery embolism, 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   Acute hypoxemic respiratory failure (HCC)    Acute ischemic stroke ([place]) -R MCA d/t R ICA and M2 occlusion s/p clot retrieval and R ICA stent 07/09/2019   Vasovagal syncope 07/18/2017   S/P TURP 07/18/2017   Coronary artery disease 07/07/2017   BPH with obstruction/lower urinary tract symptoms 07/07/2017   Encounter for therapeutic drug monitoring 04/09/2017   Right lower lobe pulmonary nodule 12/19/2016   Noncompliance with treatment plan 12/19/2016   Acute renal failure (ARF) ([place]) 12/18/2016   Hyperkalemia 12/18/2016   Hyperbilirubinemia 12/18/2016   UTI (urinary tract infection) 05/31/2016   Acute urinary retention 05/31/2016   Acute kidney injury superimposed on chronic kidney disease ([place]) 05/31/2016   Generalized weakness 05/31/2016   Falls 29/52/8413   Acute systolic CHF (congestive heart failure) (HCC)    Congestive dilated cardiomyopathy (HCC)    Uncontrolled hypertension    Pulmonary hypertension (HCC)    Chronic systolic CHF (congestive heart failure) (HCC)    Paroxysmal atrial fibrillation ([place]) 12/19/2015   Hypertension 12/19/2015   Acute CHF ([place]) 12/19/2015   Hyperglycemia 12/19/2015   Acid reflux    Hyperlipidemia    Sinus bradycardia    Gastroesophageal reflux disease    Other hyperlipidemia    Elevated troponin     Palliative Care Assessment & Plan   Patient  Profile: Stephen Copeland is a 79 year old male with PMHx stroke, hypertension, A. fib who presented with increasing cough, dyspnea, and weakness.  He [place] admitted with PNA, likely aspiration.  Plan moving forward is for comfort care.  Palliative consulted for goals of care.  Assessment: Patient Active Problem List   Diagnosis Date Noted   Pneumonia 12/11/2019   Sepsis (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 08/06/2019   Frequent loose stools 24/40/1027   Acute embolic stroke (HCC)    SBO (small bowel obstruction) ([place])    History of prostate cancer    Stage 3 chronic kidney disease (HCC)    Agitation    Supplemental oxygen dependent    Middle cerebral artery embolism, 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   Acute hypoxemic respiratory failure (HCC)    Acute ischemic stroke ([place]) -R MCA d/t R ICA and M2 occlusion s/p clot retrieval and R ICA stent 07/09/2019   Vasovagal syncope 07/18/2017   S/P TURP 07/18/2017   Coronary artery disease 07/07/2017   BPH with obstruction/lower urinary tract symptoms 07/07/2017   Encounter for therapeutic drug monitoring 04/09/2017   Right lower lobe pulmonary nodule 12/19/2016   Noncompliance with treatment plan 12/19/2016   Acute renal failure (ARF) ([place]) 12/18/2016   Hyperkalemia 12/18/2016   Hyperbilirubinemia 12/18/2016   UTI (urinary tract infection) 05/31/2016   Acute urinary retention 05/31/2016   Acute kidney injury superimposed on chronic kidney disease ([place]) 05/31/2016   Generalized weakness 05/31/2016   Falls  25/36/6440   Acute systolic CHF (congestive heart failure) ([place])  Congestive dilated cardiomyopathy (HCC)    Uncontrolled hypertension    Pulmonary hypertension (HCC)    Chronic systolic CHF (congestive heart failure) (HCC)    Paroxysmal atrial fibrillation ([place]) 12/19/2015   Hypertension 12/19/2015   Acute CHF ([place]) 12/19/2015   Hyperglycemia 12/19/2015   Acid reflux    Hyperlipidemia    Sinus bradycardia    Gastroesophageal reflux disease    Other hyperlipidemia    Elevated troponin    Recommendations/Plan:  DNR/DNI  Comfort care moving forward.  Continue aggressive management of symptoms.  Plan for transition to residential hospice when bed available.  Goals of Care and Additional Recommendations:  Limitations on Scope of Treatment: Full Comfort Care  Code Status:    Code Status Orders  (From admission, onward)         Start     Ordered   12/10/19 1756  Do not attempt resuscitation (DNR)  Continuous       Question Answer Comment  In the event of cardiac or respiratory ARREST Do not call a code blue   In the event of cardiac or respiratory ARREST Do not perform Intubation, CPR, defibrillation or ACLS   In the event of cardiac or respiratory ARREST Use medication by any route, position, wound care, and other measures to relive pain and suffering. May use oxygen, suction and manual treatment of airway obstruction as needed for comfort.   Comments confirmed w/ patient and wife      12/10/19 1756        Code Status History    Date Active Date Inactive Code Status Order ID Comments User Context   09/09/2019 0304 09/10/2019 0418 Full Code [id number]  Donnamae Jude, MD Inpatient   09/02/2019 0317 09/08/2019 2112 Full Code [id number]  Rise Patience, MD Inpatient   07/10/2019 0058 07/13/2019 1130 Full Code [id number]  Luanne Bras, MD Inpatient   07/09/2019 2133 07/10/2019 0058 Full Code [id number]  Amie Portland, MD ED   12/18/2016 2142 12/23/2016 0011  Full Code [id number]  Reubin Milan, MD Inpatient   05/31/2016 0505 06/06/2016 1756 Full Code [id number]  Norval Morton, MD ED   12/19/2015 1459 12/22/2015 1459 Full Code [id number]  Black, Lezlie Octave, NP ED   Advance Care Planning Activity    Advance Directive Documentation     Most Recent Value  Type of Advance Directive Healthcare Power of Attorney, Living will  Pre-existing out of facility DNR order (yellow form or pink MOST form) --  "MOST" Form in Place? --       Prognosis:   < 2 weeks- Focus moving forward is on comfort.  He has stopped antibiotics and tube feeds. His prognosis moving forward is < 2 weeks  Discharge Planning:  Hospice facility  Thank you for allowing the Palliative Medicine Team to assist in the care of this patient.   Total Time 20 Prolonged Time Billed No   Greater than 50%  of this time was spent counseling and coordinating care related to the above assessment and plan.  Micheline Rough, MD  Please contact Palliative Medicine Team phone at [phone] for questions and concerns.

## 2019-12-14 NOTE — TOC Progression Note (Signed)
Transition of Care [facility]) - Progression Note    Patient Details  Name: Stephen Copeland MRN: [id number] Date of Birth: Jan 25, 1941  [facility]) CM/SW Contact  Purcell Mouton, RN Phone Number: 12/14/2019, 12:39 PM  Clinical Narrative:     Pt's sister Bethena Roys was called to inform her that pt would transport to [place] in the next hour. Bethena Roys states she will call her sister in law, pt's wife. [facility] was called for transportation.   Expected Discharge Plan: [place] Barriers to Discharge: No Barriers Identified  Expected Discharge Plan and Services Expected Discharge Plan: [place] arrangements for the past 2 months: Single Family Home Expected Discharge Date: 12/14/19                                     Social Determinants of Health (SDOH) Interventions    Readmission Risk Interventions No flowsheet data found.

## 2019-12-14 NOTE — Care Management Important Message (Signed)
Important Message  Patient Details IM Letter given to the [place] Name: Stephen Copeland MRN: [id number] Date of Birth: May 06, 1940   Medicare Important Message Given:  Yes     Kerin Salen 12/14/2019, 11:30 AM

## 2019-12-14 NOTE — Progress Notes (Signed)
Patient discharged to [facility] via [place].  Sister contacted about transfer.  Sister was going to update patient's wife.  Patient IV not removed per request of hospice nurse.  Patient left with personal pillow and belongings.  Patient medicated with morphine prior to transfer.

## 2019-12-15 LAB — CULTURE, BLOOD (SINGLE): Culture: NO GROWTH

## 2019-12-31 DIAGNOSIS — K5721 Diverticulitis of large intestine with perforation and abscess with bleeding: Secondary | ICD-10-CM | POA: Diagnosis not present

## 2020-01-04 DEATH — deceased

## 2021-01-17 IMAGING — DX DG CHEST 1V PORT
1 series · 1 of 1 positions shown · non-contrast
Comparison: 07/19/2019, 12/18/2016, 07/14/2019

CLINICAL DATA: Shortness of breath

EXAM:
PORTABLE CHEST 1 VIEW

[chest]
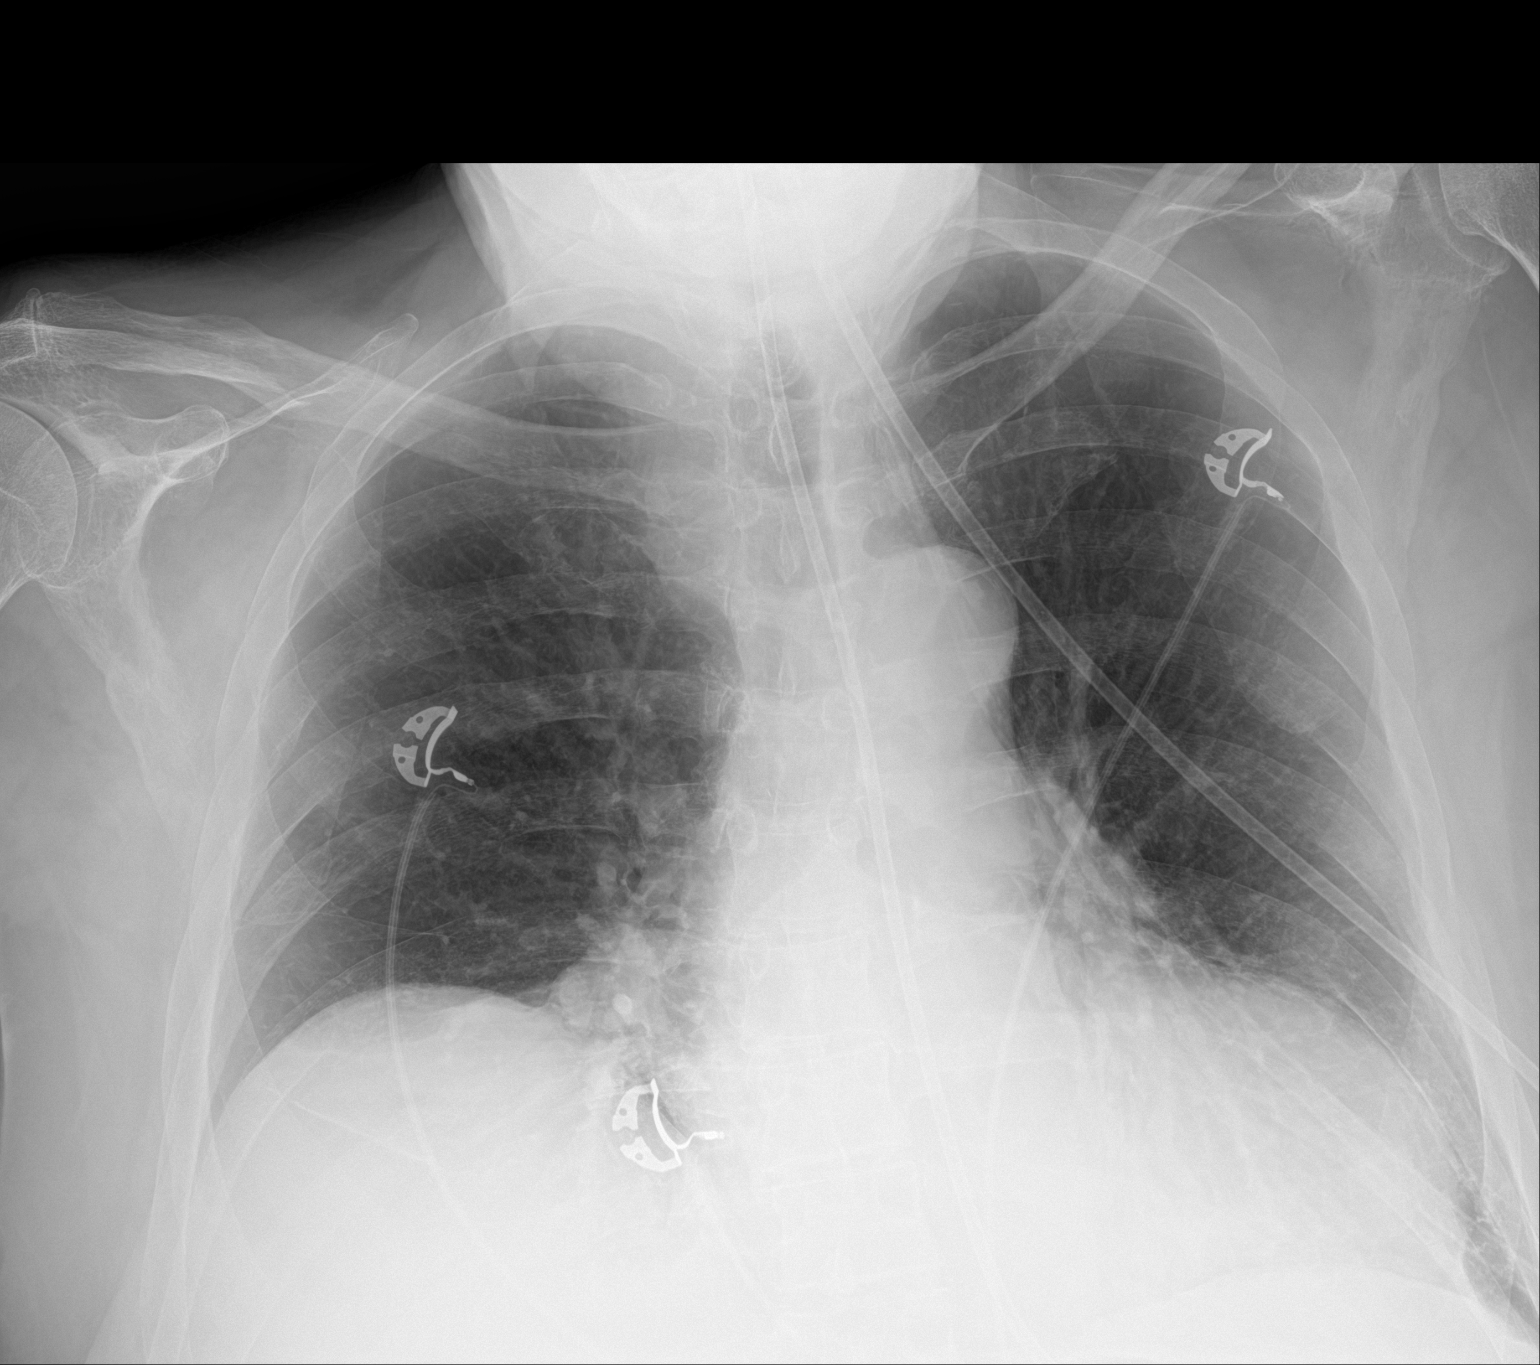

[1 of 1 positions shown; findings below may reference images not displayed]

FINDINGS: Esophageal tube tip below the diaphragm but incompletely visualized.
The right lung is grossly clear. Slightly improved aeration at left
base with residual atelectasis medially. Enlarged cardiomediastinal
silhouette with aortic atherosclerosis. No pneumothorax.
IMPRESSION: Slightly improved aeration at left lung base with residual
atelectasis or infiltrate medially. Cardiomegaly.

## 2021-01-26 IMAGING — CT CT HEAD W/O CM
4 series · 17 of 47 positions shown, 19 images · non-contrast
Comparison: 07/16/2019

CLINICAL DATA: Stroke, follow-up

EXAM:
CT HEAD WITHOUT CONTRAST
TECHNIQUE: Contiguous axial images were obtained from the base of the skull
through the vertex without intravenous contrast.

[Series 3: head without · axial · non-contrast · 0.42mm/px · z∈[-96,+24]mm · 7 of 33 slices shown, 9 images]
[im 5/33  brain]
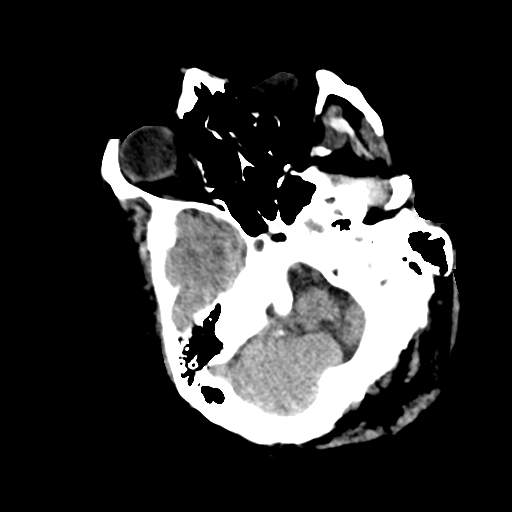
[im 5/33  bone]
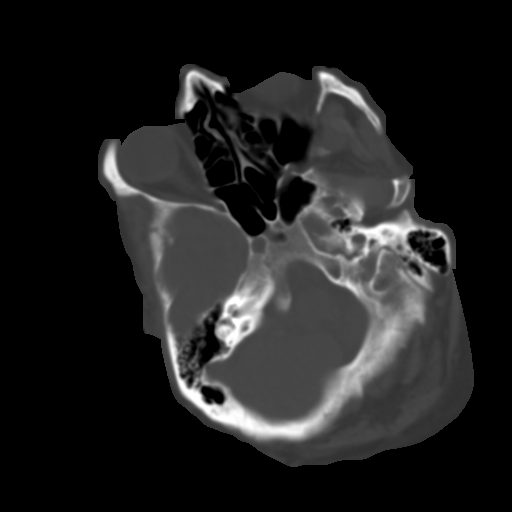
[im 9/33  brain]
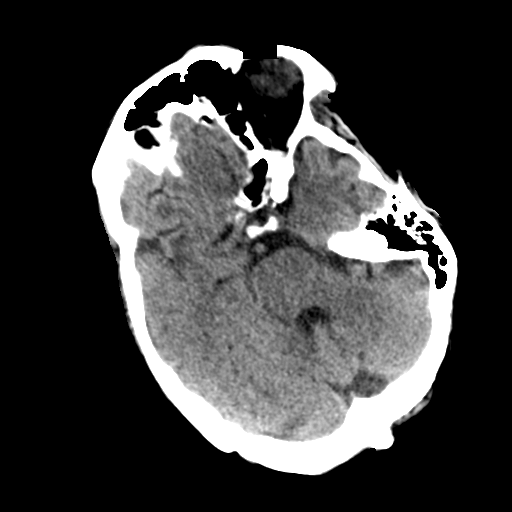
[im 13/33  brain]
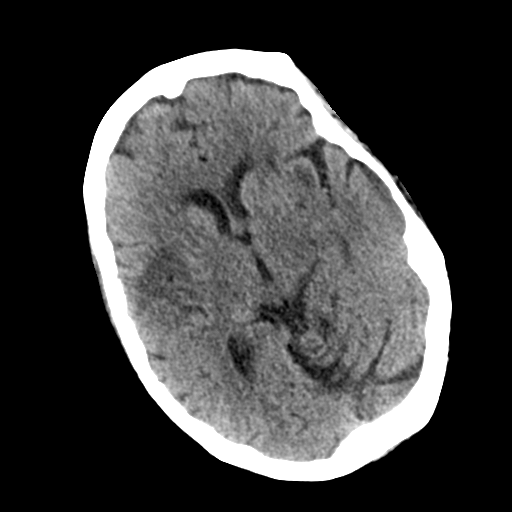
[im 17/33  brain]
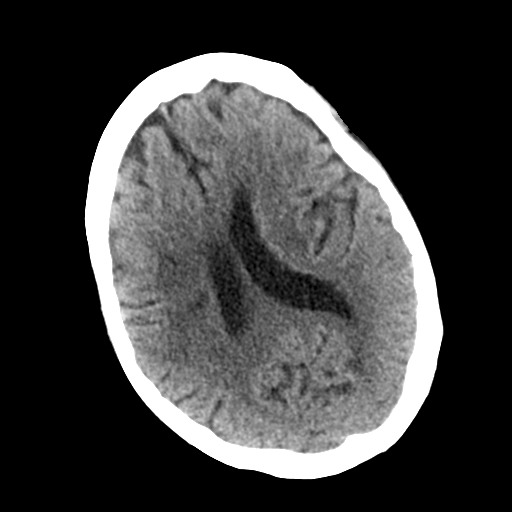
[im 21/33  brain]
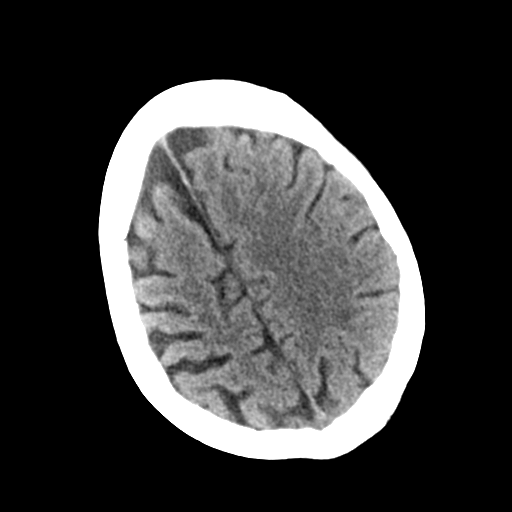
[im 21/33  bone]
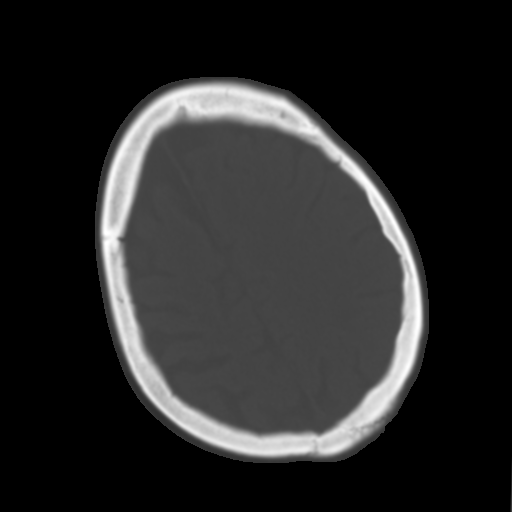
[im 25/33  brain]
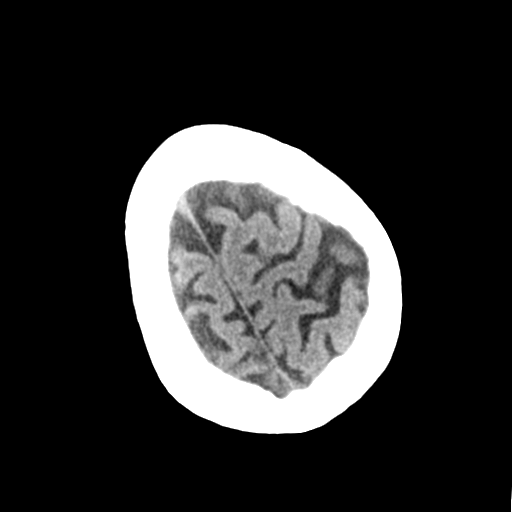
[im 29/33  brain]
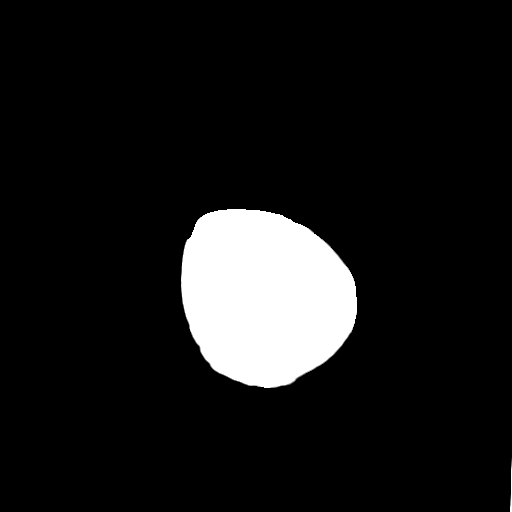

[Series 4: head bone · axial · 0.42mm/px · z∈[-100,-44]mm · 4 of 82 slices shown]
[im 9/82  bone]
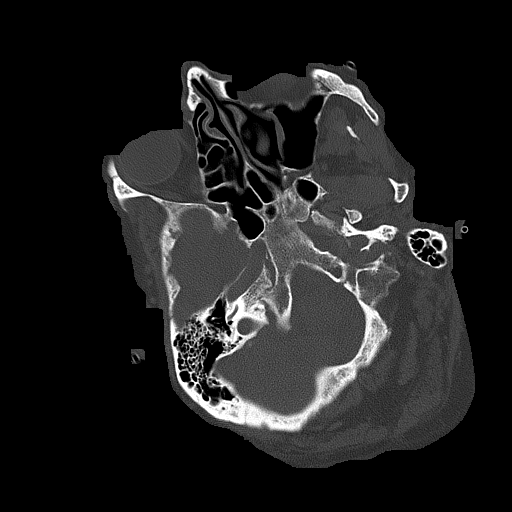
[im 17/82  bone]
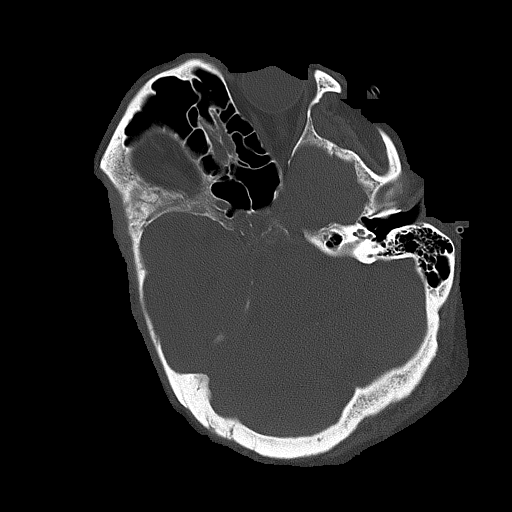
[im 25/82  bone]
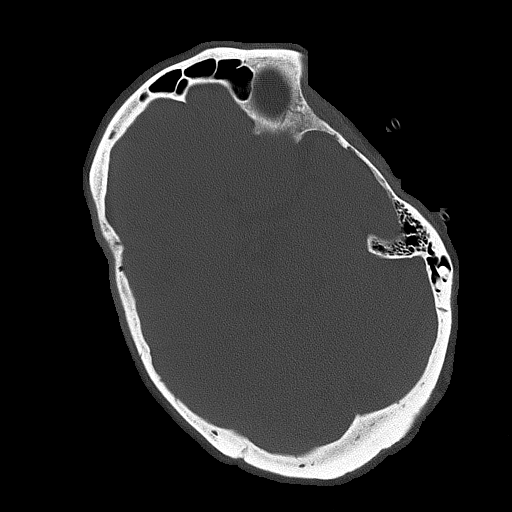
[im 37/82  bone]
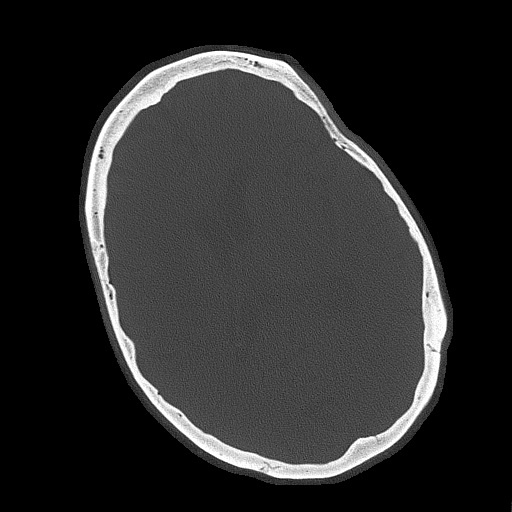

[Series 5: head without cor · coronal · non-contrast · 0.32mm/px · 3 of 66 slices shown]
[im 22/66  brain]
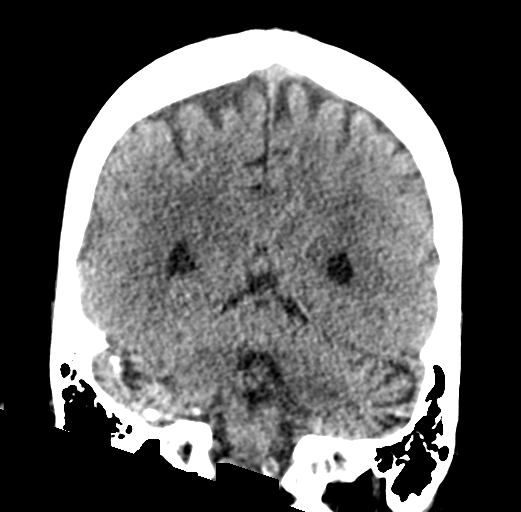
[im 29/66  brain]
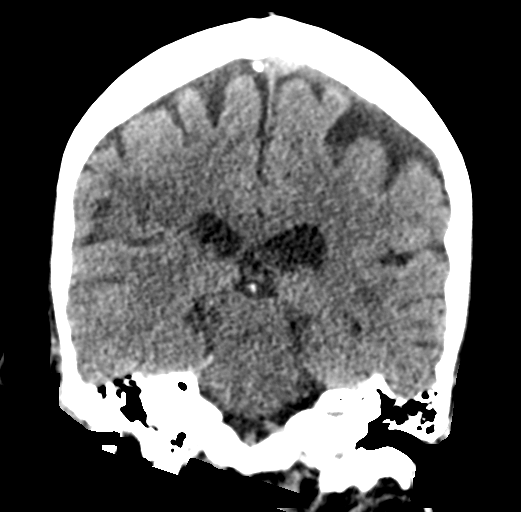
[im 37/66  brain]
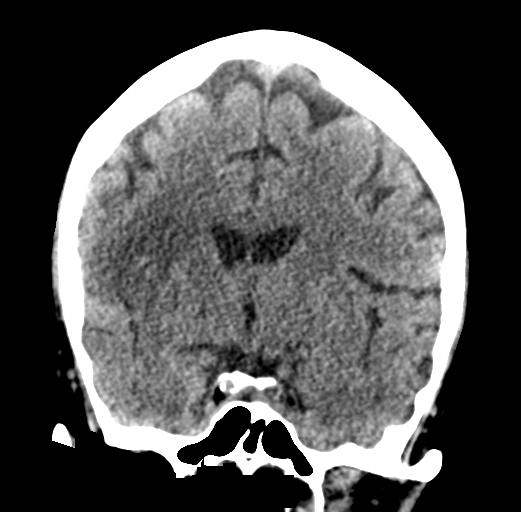

[Series 6: head without sag · sagittal · non-contrast · 0.31mm/px · 3 of 57 slices shown]
[im 25/57  brain]
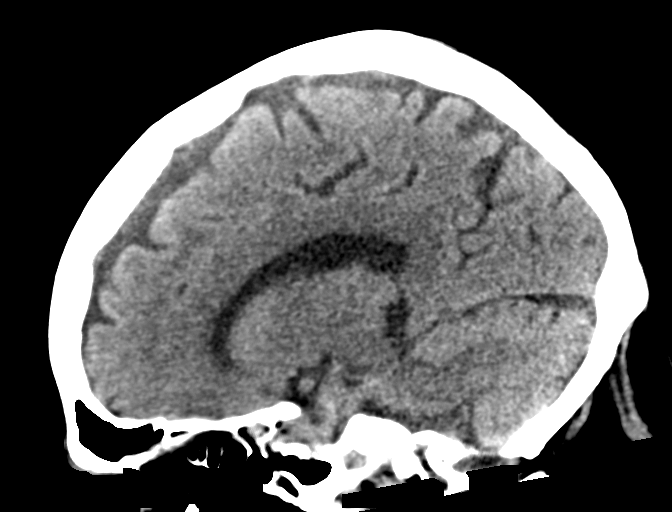
[im 29/57  brain]
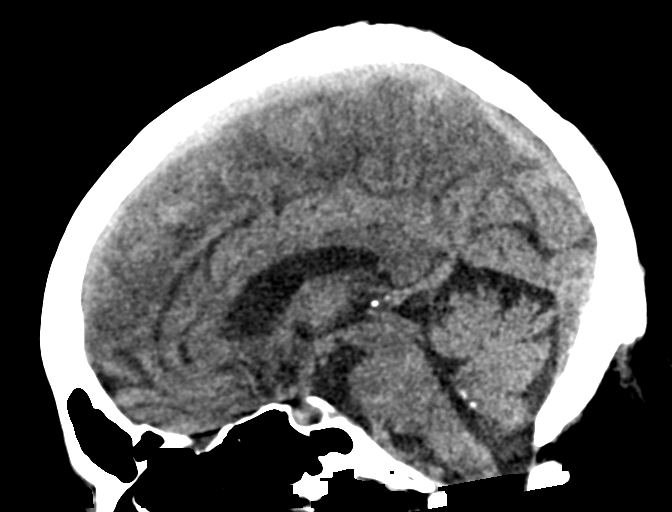
[im 33/57  brain]
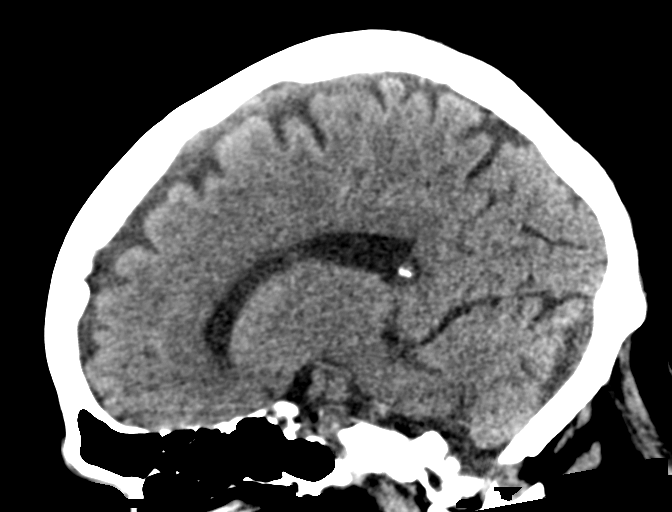

[17 of 47 positions shown; findings below may reference images not displayed]

FINDINGS: Brain: Similar extent of previously seen right MCA territory
infarction. Previous mild mass effect has essentially resolved.
Hyperdense subarachnoid hemorrhage has resolved. There is no new
hemorrhage or loss of gray-white differentiation.

Ventricles are stable in size. Stable findings of probable chronic
microvascular ischemic changes.

Vascular: There is atherosclerotic calcification at the skull base.

Skull: Calvarium is unremarkable.

Sinuses/Orbits: No acute finding.

Other: None.
IMPRESSION: Expected evolution of right MCA territory infarction. Previously
seen hemorrhage has resolved. No new findings.
